# Patient Record
Sex: Female | Born: 1960 | Race: White | Hispanic: No | Marital: Single | Smoking: Never smoker
Health system: Southern US, Community
[De-identification: ages and names within clinical notes are randomized; demographics above are authoritative.]

## PROBLEM LIST (undated history)

## (undated) DIAGNOSIS — F329 Major depressive disorder, single episode, unspecified: Secondary | ICD-10-CM

## (undated) DIAGNOSIS — T884XXA Failed or difficult intubation, initial encounter: Secondary | ICD-10-CM

## (undated) DIAGNOSIS — F419 Anxiety disorder, unspecified: Secondary | ICD-10-CM

## (undated) DIAGNOSIS — T4145XA Adverse effect of unspecified anesthetic, initial encounter: Secondary | ICD-10-CM

## (undated) DIAGNOSIS — G4730 Sleep apnea, unspecified: Secondary | ICD-10-CM

## (undated) DIAGNOSIS — K289 Gastrojejunal ulcer, unspecified as acute or chronic, without hemorrhage or perforation: Secondary | ICD-10-CM

## (undated) DIAGNOSIS — K648 Other hemorrhoids: Secondary | ICD-10-CM

## (undated) DIAGNOSIS — T8859XA Other complications of anesthesia, initial encounter: Secondary | ICD-10-CM

## (undated) DIAGNOSIS — Z8542 Personal history of malignant neoplasm of other parts of uterus: Secondary | ICD-10-CM

## (undated) DIAGNOSIS — E785 Hyperlipidemia, unspecified: Secondary | ICD-10-CM

## (undated) DIAGNOSIS — F32A Depression, unspecified: Secondary | ICD-10-CM

## (undated) DIAGNOSIS — Z9884 Bariatric surgery status: Secondary | ICD-10-CM

## (undated) DIAGNOSIS — C801 Malignant (primary) neoplasm, unspecified: Secondary | ICD-10-CM

## (undated) DIAGNOSIS — I2510 Atherosclerotic heart disease of native coronary artery without angina pectoris: Secondary | ICD-10-CM

## (undated) DIAGNOSIS — K219 Gastro-esophageal reflux disease without esophagitis: Secondary | ICD-10-CM

## (undated) DIAGNOSIS — R011 Cardiac murmur, unspecified: Secondary | ICD-10-CM

## (undated) DIAGNOSIS — J189 Pneumonia, unspecified organism: Secondary | ICD-10-CM

## (undated) DIAGNOSIS — I10 Essential (primary) hypertension: Secondary | ICD-10-CM

## (undated) DIAGNOSIS — G4733 Obstructive sleep apnea (adult) (pediatric): Secondary | ICD-10-CM

## (undated) DIAGNOSIS — Z87442 Personal history of urinary calculi: Secondary | ICD-10-CM

## (undated) DIAGNOSIS — Z9989 Dependence on other enabling machines and devices: Secondary | ICD-10-CM

## (undated) DIAGNOSIS — Z86010 Personal history of colonic polyps: Secondary | ICD-10-CM

## (undated) DIAGNOSIS — M1990 Unspecified osteoarthritis, unspecified site: Secondary | ICD-10-CM

## (undated) HISTORY — DX: Malignant (primary) neoplasm, unspecified: C80.1

## (undated) HISTORY — DX: Sleep apnea, unspecified: G47.30

## (undated) HISTORY — DX: Essential (primary) hypertension: I10

## (undated) HISTORY — DX: Morbid (severe) obesity due to excess calories: E66.01

## (undated) HISTORY — DX: Cardiac murmur, unspecified: R01.1

## (undated) HISTORY — PX: COLONOSCOPY: SHX174

## (undated) HISTORY — PX: POLYPECTOMY: SHX149

## (undated) HISTORY — DX: Hyperlipidemia, unspecified: E78.5

---

## 1992-04-29 HISTORY — PX: UVULOPALATOPHARYNGOPLASTY, TONSILLECTOMY AND SEPTOPLASTY: SHX2632

## 1997-08-08 ENCOUNTER — Other Ambulatory Visit: Admission: RE | Admit: 1997-08-08 | Discharge: 1997-08-08 | Payer: Self-pay | Admitting: Obstetrics & Gynecology

## 1999-04-30 HISTORY — PX: REDUCTION MAMMAPLASTY: SUR839

## 1999-08-16 ENCOUNTER — Other Ambulatory Visit: Admission: RE | Admit: 1999-08-16 | Discharge: 1999-08-16 | Payer: Self-pay | Admitting: Obstetrics and Gynecology

## 1999-11-16 ENCOUNTER — Encounter: Payer: Self-pay | Admitting: Plastic Surgery

## 1999-11-16 ENCOUNTER — Observation Stay (HOSPITAL_COMMUNITY): Admission: RE | Admit: 1999-11-16 | Discharge: 1999-11-17 | Payer: Self-pay | Admitting: Plastic Surgery

## 1999-11-16 HISTORY — PX: OTHER SURGICAL HISTORY: SHX169

## 2000-06-17 ENCOUNTER — Observation Stay (HOSPITAL_COMMUNITY): Admission: EM | Admit: 2000-06-17 | Discharge: 2000-06-18 | Payer: Self-pay

## 2000-06-18 ENCOUNTER — Encounter: Payer: Self-pay | Admitting: Psychiatry

## 2001-12-02 ENCOUNTER — Encounter: Payer: Self-pay | Admitting: Family Medicine

## 2001-12-02 ENCOUNTER — Encounter: Admission: RE | Admit: 2001-12-02 | Discharge: 2001-12-02 | Payer: Self-pay | Admitting: Family Medicine

## 2004-04-29 DIAGNOSIS — C801 Malignant (primary) neoplasm, unspecified: Secondary | ICD-10-CM

## 2004-04-29 HISTORY — DX: Malignant (primary) neoplasm, unspecified: C80.1

## 2004-08-06 ENCOUNTER — Other Ambulatory Visit: Admission: RE | Admit: 2004-08-06 | Discharge: 2004-08-06 | Payer: Self-pay | Admitting: Gynecology

## 2004-08-13 ENCOUNTER — Other Ambulatory Visit: Admission: RE | Admit: 2004-08-13 | Discharge: 2004-08-13 | Payer: Self-pay | Admitting: Gynecology

## 2004-10-22 ENCOUNTER — Ambulatory Visit: Admission: RE | Admit: 2004-10-22 | Discharge: 2004-10-23 | Payer: Self-pay | Admitting: Gynecology

## 2004-10-23 ENCOUNTER — Ambulatory Visit: Payer: Self-pay | Admitting: Internal Medicine

## 2004-11-06 ENCOUNTER — Ambulatory Visit: Payer: Self-pay | Admitting: Internal Medicine

## 2004-12-20 ENCOUNTER — Encounter (INDEPENDENT_AMBULATORY_CARE_PROVIDER_SITE_OTHER): Payer: Self-pay | Admitting: *Deleted

## 2004-12-20 ENCOUNTER — Inpatient Hospital Stay (HOSPITAL_COMMUNITY): Admission: RE | Admit: 2004-12-20 | Discharge: 2004-12-22 | Payer: Self-pay | Admitting: Gynecology

## 2004-12-20 HISTORY — PX: ABDOMINAL HYSTERECTOMY: SHX81

## 2005-01-22 ENCOUNTER — Ambulatory Visit: Payer: Self-pay | Admitting: Internal Medicine

## 2005-02-28 ENCOUNTER — Ambulatory Visit: Payer: Self-pay | Admitting: Internal Medicine

## 2005-11-07 ENCOUNTER — Other Ambulatory Visit: Admission: RE | Admit: 2005-11-07 | Discharge: 2005-11-07 | Payer: Self-pay | Admitting: Gynecology

## 2006-05-01 ENCOUNTER — Other Ambulatory Visit: Admission: RE | Admit: 2006-05-01 | Discharge: 2006-05-01 | Payer: Self-pay | Admitting: Gynecology

## 2006-11-21 ENCOUNTER — Other Ambulatory Visit: Admission: RE | Admit: 2006-11-21 | Discharge: 2006-11-21 | Payer: Self-pay | Admitting: Gynecology

## 2006-11-26 ENCOUNTER — Emergency Department (HOSPITAL_COMMUNITY): Admission: EM | Admit: 2006-11-26 | Discharge: 2006-11-26 | Payer: Self-pay | Admitting: Family Medicine

## 2008-05-16 ENCOUNTER — Other Ambulatory Visit: Admission: RE | Admit: 2008-05-16 | Discharge: 2008-05-16 | Payer: Self-pay | Admitting: Gynecology

## 2008-05-16 ENCOUNTER — Ambulatory Visit: Payer: Self-pay | Admitting: Gynecology

## 2008-05-16 ENCOUNTER — Encounter: Payer: Self-pay | Admitting: Gynecology

## 2008-05-19 ENCOUNTER — Emergency Department (HOSPITAL_BASED_OUTPATIENT_CLINIC_OR_DEPARTMENT_OTHER): Admission: EM | Admit: 2008-05-19 | Discharge: 2008-05-19 | Payer: Self-pay | Admitting: Emergency Medicine

## 2008-05-19 ENCOUNTER — Ambulatory Visit: Payer: Self-pay | Admitting: Diagnostic Radiology

## 2008-07-12 ENCOUNTER — Encounter: Admission: RE | Admit: 2008-07-12 | Discharge: 2008-07-12 | Payer: Self-pay | Admitting: Family Medicine

## 2008-08-12 ENCOUNTER — Ambulatory Visit (HOSPITAL_COMMUNITY): Admission: RE | Admit: 2008-08-12 | Discharge: 2008-08-12 | Payer: Self-pay | Admitting: Gastroenterology

## 2008-12-23 ENCOUNTER — Encounter: Admission: RE | Admit: 2008-12-23 | Discharge: 2008-12-23 | Payer: Self-pay | Admitting: Family Medicine

## 2009-01-28 ENCOUNTER — Ambulatory Visit: Payer: Self-pay | Admitting: Diagnostic Radiology

## 2009-01-28 ENCOUNTER — Emergency Department (HOSPITAL_BASED_OUTPATIENT_CLINIC_OR_DEPARTMENT_OTHER): Admission: EM | Admit: 2009-01-28 | Discharge: 2009-01-28 | Payer: Self-pay | Admitting: Emergency Medicine

## 2009-04-29 DIAGNOSIS — Z86010 Personal history of colonic polyps: Secondary | ICD-10-CM

## 2009-04-29 DIAGNOSIS — Z860101 Personal history of adenomatous and serrated colon polyps: Secondary | ICD-10-CM

## 2009-04-29 HISTORY — DX: Personal history of colonic polyps: Z86.010

## 2009-04-29 HISTORY — DX: Personal history of adenomatous and serrated colon polyps: Z86.0101

## 2009-12-29 DIAGNOSIS — Z86010 Personal history of colonic polyps: Secondary | ICD-10-CM | POA: Insufficient documentation

## 2010-01-26 ENCOUNTER — Encounter: Admission: RE | Admit: 2010-01-26 | Discharge: 2010-01-26 | Payer: Self-pay | Admitting: Gynecology

## 2010-03-09 ENCOUNTER — Ambulatory Visit: Payer: Self-pay | Admitting: Gynecology

## 2010-03-09 ENCOUNTER — Other Ambulatory Visit: Admission: RE | Admit: 2010-03-09 | Discharge: 2010-03-09 | Payer: Self-pay | Admitting: Gynecology

## 2010-06-29 ENCOUNTER — Encounter (INDEPENDENT_AMBULATORY_CARE_PROVIDER_SITE_OTHER): Payer: BC Managed Care – PPO

## 2010-06-29 DIAGNOSIS — N393 Stress incontinence (female) (male): Secondary | ICD-10-CM

## 2010-07-20 ENCOUNTER — Ambulatory Visit: Payer: BC Managed Care – PPO | Admitting: Gynecology

## 2010-08-13 LAB — DIFFERENTIAL
Basophils Absolute: 0 10*3/uL (ref 0.0–0.1)
Basophils Relative: 1 % (ref 0–1)
Eosinophils Absolute: 0 10*3/uL (ref 0.0–0.7)
Eosinophils Relative: 0 % (ref 0–5)
Lymphocytes Relative: 41 % (ref 12–46)
Lymphs Abs: 2.6 10*3/uL (ref 0.7–4.0)
Monocytes Absolute: 0.7 10*3/uL (ref 0.1–1.0)
Monocytes Relative: 11 % (ref 3–12)
Neutro Abs: 3 10*3/uL (ref 1.7–7.7)
Neutrophils Relative %: 47 % (ref 43–77)

## 2010-08-13 LAB — COMPREHENSIVE METABOLIC PANEL
ALT: 49 U/L — ABNORMAL HIGH (ref 0–35)
AST: 40 U/L — ABNORMAL HIGH (ref 0–37)
Albumin: 4.3 g/dL (ref 3.5–5.2)
Alkaline Phosphatase: 65 U/L (ref 39–117)
BUN: 14 mg/dL (ref 6–23)
CO2: 26 mEq/L (ref 19–32)
Calcium: 9.2 mg/dL (ref 8.4–10.5)
Chloride: 105 mEq/L (ref 96–112)
Creatinine, Ser: 0.7 mg/dL (ref 0.4–1.2)
GFR calc Af Amer: >60 mL/min (ref >60)
GFR calc non Af Amer: >60 mL/min (ref >60)
Glucose, Bld: 178 mg/dL — ABNORMAL HIGH (ref 70–99)
Potassium: 3.6 mEq/L (ref 3.5–5.1)
Sodium: 141 mEq/L (ref 135–145)
Total Bilirubin: 0.5 mg/dL (ref 0.3–1.2)
Total Protein: 7.4 g/dL (ref 6.0–8.3)

## 2010-08-13 LAB — CBC
HCT: 42.2 % (ref 36.0–46.0)
Hemoglobin: 14.1 g/dL (ref 12.0–15.0)
MCHC: 33.5 g/dL (ref 30.0–36.0)
MCV: 93.6 fL (ref 78.0–100.0)
Platelets: 229 10*3/uL (ref 150–400)
RBC: 4.51 MIL/uL (ref 3.87–5.11)
RDW: 12.4 % (ref 11.5–15.5)
WBC: 6.3 10*3/uL (ref 4.0–10.5)

## 2010-08-13 LAB — URINALYSIS, ROUTINE W REFLEX MICROSCOPIC
Bilirubin Urine: NEGATIVE
Glucose, UA: NEGATIVE mg/dL
Ketones, ur: 15 mg/dL — AB
Leukocytes, UA: NEGATIVE
Nitrite: NEGATIVE
Protein, ur: NEGATIVE mg/dL
Specific Gravity, Urine: 1.023 (ref 1.005–1.030)
Urobilinogen, UA: 1 mg/dL (ref 0.0–1.0)
pH: 5.5 (ref 5.0–8.0)

## 2010-08-13 LAB — LIPASE, BLOOD: Lipase: 69 U/L (ref 23–300)

## 2010-08-13 LAB — URINE MICROSCOPIC-ADD ON

## 2010-08-13 LAB — GLUCOSE, CAPILLARY: Glucose-Capillary: 184 mg/dL — ABNORMAL HIGH (ref 70–99)

## 2010-09-14 NOTE — H&P (Signed)
Sandra [name], Sandra [name]                 ACCOUNT NO.:  [id number]   MEDICAL RECORD NO.:  [id number]          PATIENT TYPE:  INP   LOCATION:  NA                            F[facility]   PHYSICIAN:  Juan H. Lily Peer, M.D.DATE OF BIRTH:  February 12, 1961   DATE OF ADMISSION:  10/23/2004  DATE OF DISCHARGE:                                HISTORY & PHYSICAL   CHIEF COMPLAINT:  1.  Menometrorrhagia.  2.  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  3.  Intrauterine polyps.   HISTORY OF PRESENT ILLNESS:  The patient is a 50 year old gravida 2, para 2  who was seen in the office for preoperative consultation on August 21, 2004.  She had been referred to our practice from Dr. Marjory Lies at [facility] secondary to complaints of irregular period for several  months where the patient was having at one time two periods per month. She  stated that her menses lasted seven to ten days, usually they were very  heavy with the passage of large clots and cramping. She had reported  spotting for a few days and in between her periods as well. She had  undergone a Pap smear on August 13, 2004 of this year which was normal. She  underwent an endometrial biopsy on August 13, 2004 which demonstrated complex  hyperplasia without atypia and on the August 15, 2004, she underwent the  sonohistogram which demonstrated multiple defects of the intrauterine cavity  consistent with thickness of the endometrium and endometrial stripe was 16.8  mm. This irregularity in the endometrial cavity. One measured 30 x 12 mm and  the 11 x 6 mm and the other 17 x 11 mm and the other one 23 x 11 mm and 19  mm respectively.   Due to the patient's risk for endometrial cancer based on her being  overweight at 192 pounds and being hypertensive and with the finding of  complex hyperplasia without atypia, it was recommended that she undergo a  total abdominal hysterectomy. The patient with a somewhat narrow pelvis. She  has had  previous cesarean section and an abdominal approach was recommended.  The patient has history of hypertension and she was worked up in the office  and been found that she is a prototype for metabolic syndrome. She had an  elevated blood sugar when she was screened on August 15, 2004 and her lipid  profile demonstrated triglycerides elevated at 306. She also had a TSH as  well which was normal and she was followed by her family practice physician  who had given medical clearance for this patient.   PAST MEDICAL HISTORY:  1.  She has had one normal spontaneous vaginal delivery and one cesarean      section with bilateral tubal sterilization.  2.  She has had a tonsillectomy and adenoidectomy.  3.  For her hypertension, she has been on Altace 10 mg daily and      hydrochlorothiazide 25 mg daily.   PHYSICAL EXAMINATION:  VITAL SIGNS:  The patient weighs 192 pounds and she  is 5 feet 4  inches tall.  HEENT:  Unremarkable.  NECK:  Supple. Trachea midline. No carotid bruits or thyromegaly.  LUNGS:  Clear to auscultation without rhonchi or wheezes.  HEART:  Regular rate and rhythm. No murmurs or gallops.  BREASTS:  Unremarkable.  ABDOMEN:  Soft and nontender without rebound or guarding.  PELVIC:  Bartholin's, urethra, Skene's were within normal limits. Vaginal  and cervix showed no lesion or discharge. Uterus anteverted, slightly  irregular, 8 to 10 week size. Adnexa difficult to evaluate due to the  patient's abdominal girth.   ASSESSMENT:  A 50 year old gravida 2, para 2 with previous cesarean section  and tubal sterilization procedure with several month history of  dysfunctional uterine bleeding, dysmenorrhea. She had undergone an  endometrial biopsy which demonstrated complex hyperplasia without atypia.  The patient with history of hypertension, currently on Altace 10 mg daily  and hydrochlorothiazide 25 mg daily. She has been under the care of Dr.  Marjory Lies at [facility]. She was cleared for her  surgery. The patient wishes to keep her ovaries and allow for natural  menopause and removal of one or both ovaries if it is clinically indicated  at the time of her surgery. We had discussed the [employer] of OB/GYN  guidelines for hysterectomy, its risks, benefits, pros and cons, as well as  hormone replacement therapy in the event that both ovaries would need to be  removed, and also the findings in the [facility] Issue and trial. We  also discussed potential risk of the operation such as infection, although  she will receive prophylactic antibiotic. She will have PSA stockings to  prevent DVT and pulmonary embolus. We also discussed in the event of  uncontrollable hemorrhage and if she were to need any blood or blood  products or potential risk of anaphylactic reaction. Hepatitis and AIDS were  discussed. We discussed potential trauma to internal organs such as that  would require corrective surgery to be done at that time. She is fully  aware, accepts, and is willing to proceed with this surgery as recommended.  As part of her preoperative labs, she will have a CBC, type and screen,  serum HCG, EKG, chest x-ray, urinalysis, and a comprehensive metabolic  panel.   PLAN:  The patient is scheduled for a total abdominal hysterectomy, possible  removal of one or both ovaries tomorrow, Tuesday, October 23, 2004 at 7:30 at  [facility].       JHF/MEDQ  D:  10/22/2004  T:  10/22/2004  Job:  [id number]   cc:   Marjory Lies, M.D.  [address]  [place]  [place] [address]  Fax: [phone]

## 2010-09-14 NOTE — Discharge Summary (Signed)
Sandra [name], Sandra [name]                 ACCOUNT NO.:  [id number]   MEDICAL RECORD NO.:  [id number]          PATIENT TYPE:  INP   LOCATION:  9303                          [facility]ILITY:  [facility]   PHYSICIAN:  Timothy P. Fontaine, M.D.DATE OF BIRTH:  07/06/1960   DATE OF ADMISSION:  12/20/2004  DATE OF DISCHARGE:  12/22/2004                                 DISCHARGE SUMMARY   DISCHARGE DIAGNOSES:  1.  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  2.  Hypertension.  3.  Diabetes.   PROCEDURE:  Total abdominal hysterectomy August 24.   PATHOLOGY:  [id number]:  Well-differentiated endometrioid carcinoma, FIGO  grade 1, arising in background of complex hyperplasia.  No invasion  identified.  Cervix negative for carcinoma.  Myometrium:  Leiomyomata.   HOSPITAL COURSE:  The patient underwent uncomplicated total abdominal  hysterectomy December 20, 2004.  Her postoperative course was uncomplicated.  She was discharged on postoperative day #2 ambulating well, tolerating a  regular diet, with a postoperative hemoglobin of 10.7.  The patient received  precautions, instructions and follow-up.  She will be seen in the office for  staple removal and received prescriptions for Lortab 7.5/500 mg one p.o. q.4-  6h. p.r.n. pain, Reglan 10 mg one p.o. q.4-6h. p.r.n. pain, per Dr. Reynaldo Minium.      Timothy P. Fontaine, M.D.  Electronically Signed     TPF/MEDQ  D:  01/26/2005  T:  01/26/2005  Job:  [id number]

## 2010-09-14 NOTE — H&P (Signed)
Sandra [name], Sandra [name]                 ACCOUNT NO.:  [id number]   MEDICAL RECORD NO.:  [id number]          PATIENT TYPE:  INP   LOCATION:  NA                            [facility]   PHYSICIAN:  Juan H. Lily Peer, M.D.DATE OF BIRTH:  28-Sep-1960   DATE OF ADMISSION:  12/20/2004  DATE OF DISCHARGE:                                HISTORY & PHYSICAL   CHIEF COMPLAINT:  1.  Menometrorrhagia.  2.  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  3.  Endometrial polyps.   HISTORY:  The patient is a 50 year old, gravida 2, para 2, who was scheduled  to undergo a total abdominal hysterectomy on October 23, 2004 as a result of  her menometrorrhagia, endometrial polyps, and complex hyperplasia, but it  had been cancelled secondary to patient's hypertension.  She eventually was  referred to an internal medicine specialist, Dr. Oliver Barre, who had  adjusted her medications, in which she is currently on Altace 30 mg daily,  along with hydrochlorothiazide 25 mg daily.  The patient had been suffering  for quite some time with irregular periods, whereby she would bleed for  approximately 10 days and spot in between.  She had undergone endometrial  biopsy on August 13, 2004, which demonstrated complex hyperplasia with  atypia.  She had also, on August 15, 2004, undergone a sonohystogram which  demonstrated multiple defects in the intrauterine cavity, consistent with  thickness of the endometrium and then an atrial stripe being 16.8 mm.  The  irregular in the endometrial cavity had demonstrated 1 lesion measuring 30 x  12 mm, and the other one measuring 11 x 6 mm, and the other one 17 x 11 mm,  and another one measuring 23 x 11 mm, suspicious for endometrial polyps.  The patient was seen for preoperative consultation on December 12, 2004, and a  repeat endometrial biopsy was done, and once again demonstrated persistence  of complex glandular proliferation with features consistent with exogenous  progesterone effect, and  she had been placed on Megace 20 mg b.i.d. in an  effort to help stop her bleeding until the time of her surgery.  Finally her  CBC indices have not indicated any evidence of anemia, although she had been  taking supplemental iron.  The patient had a mammogram in August of this  year which was normal, and was cleared for surgery by Dr. Oliver Barre.  Due  to patient's obesity of weighing 192 pounds and her pelvis, it was decided  to proceed with a vaginal approach for her hysterectomy.   PAST MEDICAL HISTORY:  1.  History of hypertension, for which she is currently on Benicar 40 daily.  2.  For her type 2 diabetes, which is on Actos 15 mg daily.  3.  Also, as part of her hypertensive regimen, she is on hydrochlorothiazide      25 mg daily.   PAST PROCEDURES:  1.  One normal spontaneous vaginal delivery and one cesarean section.  2.  Tonsillectomy and adenoidectomy.  3.  Bilateral breast reduction.   ALLERGIES:  The patient denies any allergies.  SOCIAL HISTORY:  The patient denies alcohol or cigarette smoking.  She is  married with 2 children.  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   FAMILY HISTORY:  The patient is adopted.  Otherwise, noncontributory.   REVIEW OF SYSTEMS:  Likewise doing well.  Her Pap smears have been up to  date, which was recently done on April of 2006 which was negative.   PHYSICAL EXAMINATION:  GENERAL:  The patient is 50 years of age.  VITAL SIGNS:  Last blood pressure reading 139/89, respirations 20, pulse 87,  temperature 98.6, weight 192 pounds.  The patient is morbidly obese for her  height.  HEENT:  Unremarkable.  NECK:  Supple.  Trachea midline.  No carotid bruits, no thyromegaly.  LUNGS:  Clear to auscultation without rhonchi or wheezes.  HEART:  Regular rate and rhythm.  No murmurs or gallops.  BREAST EXAM:  Was done at the time of her annual exam, and was reportedly  normal.  ABDOMEN:  Soft, nontender, without  rebound or guarding.  PELVIC:  Bartholin's urethra, Skene's glands within normal limits.  Vagina  and cervix reveal no lesions or discharge.  Uterus irregular __________  regular of approximately 8 weeks size.  Adnexa difficult to evaluate due to  patient's obesity, and he evaluated by ultrasound, which demonstrates she  had no adnexal masses based on ultrasound on August 15, 2004.  Rectal exam:  Unremarkable.   ASSESSMENT:  A 50 year old gravida 2, para 2, with type 2 diabetes and  hypertension.  Previously scheduled for a total abdominal hysterectomy as a  result of dysfunctional uterine bleeding (complex hyperplasia without  atypical) with uterine polyps.  The patient had previously been scheduled,  but had been counseled secondary to poorly-controlled hypertension and  diabetes.  The patient has been cleared by Dr. Oliver Barre for her surgery,  and, due to her obesity and narrow pelvis, it was decided to proceed with  the abdominal approach.  The patient wishes to keep her ovaries.  The risks  and benefits and PRO's of the operation were discussed in detail to include  the potential for infection, although she will receive prophylactic  antibiotic.  The risks for deep vein thrombosis and subsequent pulmonary  embolus, and death were discussed.  The patient will have PSA stockings for  prophylaxis.  Also, in the event of uncontrolled hemorrhage, then the  patient would receive blood and blood products.  He is fully aware that the  potential need of this life-saving measure to receive blood and blood  products with his potential risk of anaphylactic reactions to hepatitis and  AIDS.  Also, the risk of trauma to internal organs with corrective surgery  to be done at the same time were discussed.  Anesthesia to discuss potential  risks from being put to sleep, and anesthetic agents will be discussed by them separately.  The patient understandings that if both ovaries are  removed if  surgically indicated that she would need to be placed on hormone  replacement therapy, and Women's Health initial trial had been discussed  before.  The patient wishes to retain her ovaries.   PLAN:  Proceed with a transabdominal hysterectomy with ovarian conservation.  All questions were answered, and will follow accordingly.   PLAN:  As patient assessment above.      Juan H. Lily Peer, M.D.  Electronically Signed     JHF/MEDQ  D:  12/19/2004  T:  12/19/2004  Job:  [id number]

## 2010-09-14 NOTE — Op Note (Signed)
[place]  Patient:    Sandra [name], Sandra [name]                        MRN: [id number] Proc. Date: 11/16/99 Adm. Date:  [id number] Disc. Date: [id number] Attending:  Loura Halt Ii Dictator:   Alfredia Ferguson, M.D.                           Operative Report  PREOPERATIVE DIAGNOSIS:  Bilateral symptomatic macromastia.  POSTOPERATIVE DIAGNOSIS: Bilateral symptomatic macromastia.  OPERATION PERFORMED:  Bilateral reduction mammoplasty.  SURGEON:  Alfredia Ferguson, M.D.  FIRST ASSISTANT:  Desmond Dike, M.D.  ANESTHESIA:  General endotracheal anesthesia.  INDICATIONS FOR SURGERY:  A 50 year old woman who has severe sleep apnea.  she also has symptomatic macromastia.  She wishes to have elective bilateral breast reduction.  She understands the risk of asymmetry, unsightly scarring, vascular compromise to the nipple, infection, bleeding requiring requiring transfusion, difficulty breathing after surgery secondary to her sleep apnea, possible need for prolonged hospitalization secondary to sleep apnea, numbness of the nipple and overall dissatisfaction of the results.  Despite of these and other risks discussed with the patient in the office she wished to proceed with the operation.  DESCRIPTION OF SURGERY:  On the day prior to surgery, skin marks were placed outlining a Wise skin pattern.  The patient was taken to the operating room where her chest was prepped and draped in a sterile fashion after adequate general endotracheal anesthesia.  Attention was first directed to the left side.  A 42 mm diameter circle was drawn around the nipple area.  An 8 cm wide inferiorly based pedicle was marked.  The pedicle was now deepithelialized.  The pedicle was dissected away from the surrounding breast tissue using electrocautery dissecting straight down to the chest wall medially and obliquely going in a lateral direction on the lateral side in order to insure  adequate vascular integrity to the nipple areolar complex.  The medial lateral and superior breast was elevated off the pectoralis fascia.  The dermaglandular wedge medially was now incised at the predetermined mark and skin incision was deepened through the skin and subcutaneous tissue.  Using electrocautery the medial dermaglandular wedge was dissected away from the medial breast flap in an oblique fashion.  Superiorly the incision was made around the new nipple areolar complex location and again the breast tissue was dissected obliquely off superior breast flap in a superior direction.  Laterally the lateral breast flap was incised at the predetermined location and the dermaglandular wedge was dissected away from the lateral breast flap leaving the lateral breast flap approximately 3 cm in thickness.  The entire excess breast tissue was now removed as one unit thinning the flaps as desired.  Excess tissue was passed off the field.  A total of 593 grams was removed from the left side.  Electrocautery was used for hemostasis throughout the entire procedure.  Following hemostasis the breast wound was copiously irrigated with saline irrigations.  It was again inspected for hemostasis.  The breast was temporarily closed using skin staples.  Attention was directed to the right breast where an identical reduction was performed.  594 grams removed from the right side.  Following achievement of hemostasis and copious irrigation on the right side closure was carried out simultaneously on the left and right side.  The inferior corner of the vertical limb of the  medial and lateral breast flap was united to the mid portion of the inframammary crease using interrupted 2-0 Vicryl suture.  The superior corner of the vertical incision through the medial and lateral breast were united in a similar fashion.  The nipple areolar complex was placed in its new position and fixed in position using interrupted  3-0 Vicryl for the dermis followed by a running 3-0 Vicryl subcuticular.  The remainder of the transverse inframammary crease incision and vertical limb of the incision was closed using interrupted 2-0 Vicryl sutures in the dermis.  The breast area was then cleansed, dressings were applied.  Steri-Strips were applied on the skin incision prior to placement of dressings.  A circumferential breast wrap was placed.  The patient tolerated the procedure well.  She had an estimated blood loss of less than 200 cc.  She was awakened, extubated and transferred to the recovery room in satisfactory condition. DD:  11/16/99 TD:  11/18/99 Job: [id number]

## 2010-09-14 NOTE — Discharge Summary (Signed)
[place]  Patient:    VAISHNAVI, DALBY                        MRN: [id number] Adm. Date:  [id number] Disc. Date: 06/18/00 Attending:  Lucas Mallow Dictator:   Joellyn Rued, P.A.C. CC:         Delorse Lek, M.D.   Discharge Summary  HISTORY OF PRESENT ILLNESS:  Ms. Anner Crete is a 50 year old white female who is referred to [facility] emergency room by her primary care M.D. secondary to chest discomfort.  She describes at least a month history of headaches, diffuse tingling, and was prescribed Zoloft last week without any improvement.  On the morning of admission, she woke up feeling bad.  She describes this as nauseated.  She developed anterior chest tightness around 8:30 on the way to work.  This radiated into her left shoulder and she felt that her arm became numb and she could not grip the steering wheel.  She told a coworker about this at work and she suggested to call her M.D.  She did so. At this point, she was short of breath and very anxious.  She went to her primary M.D.s office and he referred her here for further evaluation.  At the office, she did receive four baby aspirin, sublingual nitroglycerin, and oxygen with possibility of some improvement.  An EKG was performed that showed normal sinus rhythm, nonspecific changes, however, the leads in V2 and V3 were reversed.  EMS was called and transported here to the hospital.  She denies any prior occurrences, palpitations, or syncope.  She does have a history of exertional shortness of breath, has not had any change in pattern.  She also has problems with pedal edema which resolves at night and also has problems with indigestion especially with some foods, but she feels that this is different from the previous symptoms.  She has a history of hypertension.  She was just diagnosed with non-insulin-dependent diabetes mellitus and actually started a diet yesterday.   She is obese.  She has a history of hyperlipidemia, last cholesterol available to [place] showed a cholesterol of 202, triglycerides 376, LDL 75, HDL 33, done in January of 2001.  She also has sleep apnea and has a CPAP machine, but she does not use this.  LABORATORY DATA:  Admission H&H was 13.4 and 39.6, normal indices, platelets 285, WBC 6.3, sodium 140, potassium 3.6, BUN 13, creatinine 0.6, glucose 130. CKs and troponins were negative for myocardial infarction.  Amylase and lipase were 35 and 23, respectively.  EKG showed normal sinus rhythm.  HOSPITAL COURSE:  Overnight, she did not have any further chest discomfort. She continues to have the headache, particularly in the back of her neck. Altace was started for her hypertension.  (It was noted that at the time of admission she did not tell us that she had just received prescription for Univasc and hydrochlorothiazide).  Stress Cardiolite was performed on February 20.  She achieved 85% predicted maximum heart rate after 5-1/2 minutes utilizing the Bruce protocol.  She complained of fatigue.  She denied any chest discomfort.  Imaging showed an EF of 75%, no signs of ischemia or scarring.  Diabetes coordinator saw her and assisted with teaching.  Once images were obtained, they were discussed with Dr. Tenny Craw and it was felt that she could be discharged home.  DISCHARGE DIAGNOSES: 1. Chest discomfort of unknown etiology.  2. Multiple cardiac risk factors; obesity, hypertension, diabetes,    hyperlipidemia.  DISPOSITION:  She is discharged home.  She is given prescriptions for Altace 2.5 mg q.d. in the place of Univasc.  She was also asked to take a coated aspirin 325 mg q.d. to continue her Toprol XL 100 mg q.d., her hydrochlorothiazide as previously and her Zoloft as previously.  She was asked to continue with her low salt, fat, cholesterol, ADA diet.  She was recommended to obtain some blood work next week for a BMP to evaluate  her kidney function and potassium with the medication adjustments.  She will see her physician in two to three weeks to follow up on her diabetes education and she also needs to follow up on her lipid panel as that she may need a lipid agent and encouragement for risk factor behavioral modification. DD:  06/18/00 TD:  06/18/00 Job: [id number]

## 2010-09-14 NOTE — Op Note (Signed)
Sandra [name], [name]                 ACCOUNT NO.:  [id number]   MEDICAL RECORD NO.:  [id number]          PATIENT TYPE:  INP   LOCATION:  9399                          FACILITY:  [facility]   PHYSICIAN:  Juan H. Lily Peer, M.D.DATE OF BIRTH:  1960/12/04   DATE OF PROCEDURE:  12/20/2004  DATE OF DISCHARGE:                                 OPERATIVE REPORT   SURGEON:  Juan H. Lily Peer, M.D.   FIRST ASSISTANT:  Daniel L. Eda Paschal, M.D.   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   PREOPERATIVE DIAGNOSES:  1.  Menometrorrhagia.  2.  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  3.  Endometrial polyps.   POSTOPERATIVE DIAGNOSES:  1.  Menometrorrhagia.  2.  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  3.  Endometrial polyps.   ANESTHESIA:  General endotracheal anesthesia (see separate report).   PROCEDURE PERFORMED:  Total abdominal hysterectomy.   FINDINGS:  Patient with a small anterior leiomyoma, anterior uterine wall.  Normal-appearing tubes and ovaries.  After opening the specimen, patient  with polypoid and fleshy-like endometrium.   DESCRIPTION OF OPERATION:  After the patient was adequately counseled, she  was taken to the operating room, where she underwent general endotracheal  anesthesia.  After anesthesia was attained, the patient was prepped and  draped in the usual sterile fashion.  A Foley catheter had been inserted  into the bladder to evacuate its contents.  A Pfannenstiel skin incision was  made 2 cm above the symphysis pubis.  The incision was carried down through  the subcutaneous tissue down to the rectus fascia, whereby the rectus fascia  was incised in a transverse fashion.  The patient was then placed in  Trendelenburg position.  The O'Connor-O'Sullivan retractors were in place.  Two Kocher clamps were placed hugging the uterus in the area of the triple  pedicles for  traction.  The right round ligament was identified, was suture  ligated with 0 Vicryl suture, transected, and the bladder flap was developed  with the surgeon's finger.  The posterior broad ligament was penetrated and  the utero-ovarian ligament was clamped, cut, and suture ligated with 0  Vicryl suture and leaving the right tube and ovary behind.  Of note, a  second suture in a free tie fashion was also placed to secure the  infundibulopelvic ligament.  A similar procedure was carried out on the  contralateral side.  The remaining broad and cardinal ligaments were  serially clamped, cut, and suture ligated with 0 Vicryl suture to the level  of the uterine arteries.  Once this was clamped, cut, and suture ligated,  the uterus was amputated and passed off the operative field for better  visualization.  The remaining cervical stump was placed under traction and  two Kochers were utilized, hugging the lower uterine segment and cervix, and  was cut, suture ligated with 0 Vicryl suture, and the remaining cervix was  amputated and passed off the operative field.  Both angles were secured with  0 Vicryl suture and the  remaining cuff was secured with interrupted sutures  of 0 Vicryl suture.  The pelvic cavity was copiously irrigated with normal  saline solution.  Both pedicles were inspected.  Hemostasis was present.  The sponge count and needle count were correct.  The O'Connor-O'Sullivan  retractors were removed.  The visceral peritoneum was not reapproximated,  but the rectus fascia was closed with 0 Vicryl suture and the subcutaneous  bleeders were Bovie cauterized.  The skin was reapproximated with skin  clips, followed by placement of Xeroform gauze and 4 x 8 dressing.  The  patient was extubated and transferred to the recovery room with stable vital  signs. Blood loss for the procedure was recorded at 175 mL, urine output 200  mL and clear, IV fluids 1500 mL of lactated Ringer's.  Of note,  the patient  received a gram of Cefotan prophylactically preoperatively as well as she  had PSA stockings for DVT prophylaxis.      Juan H. Lily Peer, M.D.  Electronically Signed     JHF/MEDQ  D:  12/20/2004  T:  12/20/2004  Job:  [id number]

## 2011-01-14 ENCOUNTER — Encounter: Payer: Commercial Managed Care - PPO | Attending: Family Medicine | Admitting: *Deleted

## 2011-01-14 ENCOUNTER — Encounter: Payer: Self-pay | Admitting: *Deleted

## 2011-01-14 DIAGNOSIS — Z713 Dietary counseling and surveillance: Secondary | ICD-10-CM | POA: Insufficient documentation

## 2011-01-14 DIAGNOSIS — E119 Type 2 diabetes mellitus without complications: Secondary | ICD-10-CM | POA: Insufficient documentation

## 2011-01-14 NOTE — Patient Instructions (Signed)
Patient will attend Core Diabetes Courses as scheduled or follow up prn.  

## 2011-01-14 NOTE — Progress Notes (Signed)
  Patient was seen on 01/14/2011 for the first of a series of three diabetes self-management courses at the [facility]. The following learning objectives were met by the patient during this course:   Defines diabetes and the role of insulin  Identifies type of diabetes and pathophysiology  States normal BG range and personal goals  Identifies three risk factors for the development of diabetes  States the need for and frequency of healthcare follow up (ADA Standards of Care)  Handouts given during class include:  [facility] Core Class 1 Handout  ADA: What's My Number (A1c handout)  [facility] ADA Standards of Care Handout  [facility] Medication Log handout  Most recent A1c = 7.5  Patient has established the following initial goals:  Increased exercise  Work on stress levels  Monitor blood glucose levels  Lose weight  Follow a diabetes meal plan  Follow-Up Plan: Pt to attend Core Diabetes Classes at [facility] in October of 2012

## 2011-01-15 ENCOUNTER — Encounter: Payer: Self-pay | Admitting: *Deleted

## 2011-02-05 ENCOUNTER — Encounter: Payer: 59 | Attending: Family Medicine | Admitting: Dietician

## 2011-02-05 DIAGNOSIS — E119 Type 2 diabetes mellitus without complications: Secondary | ICD-10-CM | POA: Insufficient documentation

## 2011-02-05 DIAGNOSIS — Z713 Dietary counseling and surveillance: Secondary | ICD-10-CM | POA: Insufficient documentation

## 2011-02-06 NOTE — Progress Notes (Signed)
  Patient was seen on 02/05/2011 for the second of a series of three diabetes self-management courses at the [facility]. The following learning objectives were met by the patient during this course:   States the relationship of exercise to blood glucose  States benefits/barriers of regular and safe exercise  States three guidelines for safe and effective exercise  Describes personal diabetes medicine regimen  Describes actions of own medications  Describes causes, symptoms, and treatment of hypo/hyperglycemia  Describes sick day rules  Identifies when to test urine for ketones when appropriate  Identifies when to call healthcare provider for acute complications  States the risk for problems with foot, skin, and dental care  States preventative foot, skin, and dental care measures  States when to call healthcare provider regarding foot, skin, and dental care  Identifies methods for evaluation of diabetes control  Discusses benefits of SBGM  Identifies relationship between nutrition, exercise, medication, and glucose levels  Discusses the importance of record keeping  *Patient received [facility] Core Program Notebook at class.  Follow-Up Plan: Patient will attend the final class of the ADA Diabetes Self-Care Education.   

## 2011-02-12 ENCOUNTER — Ambulatory Visit: Payer: BC Managed Care – PPO

## 2011-03-14 ENCOUNTER — Ambulatory Visit: Payer: BC Managed Care – PPO

## 2011-03-18 ENCOUNTER — Other Ambulatory Visit: Payer: Self-pay | Admitting: Gynecology

## 2011-03-18 DIAGNOSIS — Z1231 Encounter for screening mammogram for malignant neoplasm of breast: Secondary | ICD-10-CM

## 2011-03-19 ENCOUNTER — Ambulatory Visit: Payer: BC Managed Care – PPO

## 2011-04-10 ENCOUNTER — Ambulatory Visit: Payer: BC Managed Care – PPO

## 2011-04-16 ENCOUNTER — Encounter: Payer: Commercial Managed Care - PPO | Attending: Family Medicine | Admitting: Dietician

## 2011-04-16 DIAGNOSIS — E119 Type 2 diabetes mellitus without complications: Secondary | ICD-10-CM | POA: Insufficient documentation

## 2011-04-16 DIAGNOSIS — Z713 Dietary counseling and surveillance: Secondary | ICD-10-CM | POA: Insufficient documentation

## 2011-04-17 NOTE — Progress Notes (Signed)
  Patient was seen on 04/17/2011 for the third of a series of three diabetes self-management courses at the [facility]. The following learning objectives were met by the patient during this course:   Identifies nutrient effects on glycemia  States the general guidelines of meal planning  Relates understanding of personal meal plan  Describes situations that cause stress and discuss methods of stress management  Identifies lifestyle behaviors for change  The following handouts were given in class:  [employer] Carbohydrate Counting book  3 Month Follow Up Visit handout  Goal setting handout  Class evaluation form  Your patient has established the following 3 month goals for diabetes self-care:  Decrease my fat intake  Walk 30 minutes 4 times per week.  Follow-Up Plan: Patient will attend a 3 month follow-up visit for diabetes self-management education.

## 2011-04-25 ENCOUNTER — Other Ambulatory Visit: Payer: Self-pay | Admitting: Urology

## 2011-05-14 ENCOUNTER — Other Ambulatory Visit: Payer: Self-pay | Admitting: Urology

## 2011-05-15 MED ORDER — ACETAMINOPHEN 10 MG/ML IV SOLN: 1000.0000 mg | Freq: Four times a day (QID) | INTRAVENOUS | Status: AC

## 2011-05-30 ENCOUNTER — Ambulatory Visit
Admission: RE | Admit: 2011-05-30 | Discharge: 2011-05-30 | Disposition: A | Discharge status: Home / Self Care | Payer: Commercial Managed Care - PPO | Source: Ambulatory Visit | Attending: Gynecology | Admitting: Gynecology

## 2011-05-30 DIAGNOSIS — Z1231 Encounter for screening mammogram for malignant neoplasm of breast: Secondary | ICD-10-CM

## 2011-07-25 ENCOUNTER — Encounter: Payer: 59 | Attending: Family Medicine | Admitting: Dietician

## 2011-07-25 DIAGNOSIS — E119 Type 2 diabetes mellitus without complications: Secondary | ICD-10-CM | POA: Insufficient documentation

## 2011-07-25 DIAGNOSIS — Z713 Dietary counseling and surveillance: Secondary | ICD-10-CM | POA: Insufficient documentation

## 2011-07-29 NOTE — Progress Notes (Signed)
  Patient was seen on 07/25/2011 for their 3 month follow-up as a part of the diabetes self-management courses at the [facility]. The following learning objectives were met by your patient during this course:  Patient self reports the following:  Diabetes control has improved since diabetes self-management training: yes Number of days blood glucose is >200: none Last MD appointment for diabetes: December 2012 Changes in treatment plan: none Confidence with ability to manage diabetes: Feels better able to manage her diabetes Areas for improvement with diabetes self-care:1. Lose more weight 2. Continue to watch food portions  3.  Exercise more regularly.   Willingness to participate in diabetes support group: Willing if I can.  Works most evenings until 6:00 PM   Please see Diabetes Flow sheet for findings related to patient's self-care. A1C: 12/2010= 7.5%    03/2011= 6.5% Weight: 12/2010= 186.3 lb  07/25/2011= 165.2 lb  Total Lost= 19.1 lb Follow-Up Plan: Patient is eligible for a "free" 30 minute diabetes self-care appointment in the next year. Patient to call and schedule as needed.

## 2011-07-31 ENCOUNTER — Encounter (HOSPITAL_COMMUNITY): Payer: Self-pay | Admitting: *Deleted

## 2011-07-31 ENCOUNTER — Emergency Department (HOSPITAL_COMMUNITY)
Admission: EM | Admit: 2011-07-31 | Discharge: 2011-07-31 | Disposition: A | Discharge status: Home / Self Care | Payer: 59 | Source: Home / Self Care | Attending: Emergency Medicine | Admitting: Emergency Medicine

## 2011-07-31 DIAGNOSIS — L0291 Cutaneous abscess, unspecified: Secondary | ICD-10-CM

## 2011-07-31 DIAGNOSIS — L0390 Cellulitis, unspecified: Secondary | ICD-10-CM

## 2011-07-31 MED ORDER — SULFAMETHOXAZOLE-TMP DS 800-160 MG PO TABS: 2.0000 | ORAL_TABLET | Freq: Two times a day (BID) | ORAL | Status: AC

## 2011-07-31 MED ORDER — MUPIROCIN 2 % EX OINT: TOPICAL_OINTMENT | Freq: Three times a day (TID) | CUTANEOUS | Status: AC

## 2011-07-31 NOTE — ED Notes (Signed)
Pt states she noticed a bump on right lower abd yesterday.  Tender to palpation, no drainage noted.  Pus filled bump about dime size noted to right lower abd above groin.  Today pt states she noticed something similar to back of right ear.  Small red bump noted.  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

## 2011-07-31 NOTE — ED Provider Notes (Signed)
Chief Complaint  Patient presents with  . Abscess    History of Present Illness:   The patient has had a two-day history of boils. The first appeared in the right lower quadrant of the abdomen and the second on the right ear. These have not drained. No fever, chills, or sweats. No prior history of abscesses. No history of MRSA, but she does have diabetes and she has not been exposed to anyone with MRSA.  Review of Systems:  Other than noted above, the patient denies any of the following symptoms: Systemic:  No fever, chills or sweats. Skin:  No rash or itching.  PMFSH:  Past medical history, family history, social history, meds, and allergies were reviewed.  Physical Exam:   Vital signs:  BP 129/74  Pulse 61  Temp(Src) 98.4 F (36.9 C) (Oral)  Resp 16  SpO2 99% Skin:  She has a tiny abscess on the helix of her right ear measuring no more than 2 mm in size. The larger abscess is in the right lower quadrant of the abdomen. This measures a centimeter in size and is tender, raised, red, and fluctulent. There is no drainage.  Otherwise normal.  No rash.  Procedure:  Verbal informed consent was obtained.  The patient was informed of the risks and benefits of the procedure and understands and accepts.  Identity of the patient was verified verbally and by wristband.   The abscess area described above was prepped with Betadine and alcohol and anesthetized with 1 mL of 2% Xylocaine with epinephrine.  Using a #11 scalpel blade, a singe straight incision was made into the area of fluctulence, yielding a small amount of prurulent drainage.  Routine cultures were obtained.  A sterile pressure dressing was applied.  Assessment:  The encounter diagnosis was Abscess.  Plan:   1.  The following meds were prescribed:   New Prescriptions   MUPIROCIN OINTMENT (BACTROBAN) 2 %    Apply topically 3 (three) times daily.   SULFAMETHOXAZOLE-TRIMETHOPRIM (BACTRIM DS) 800-160 MG PER TABLET    Take 2 tablets by  mouth 2 (two) times daily.   2.  The patient was instructed in symptomatic care and handouts were given. 3.  The patient was instructed to leave the dressing in place and return again in 48 hours for packing removal. 4.  The patient was instructed to apply mupirocin ointment to her nostril twice daily for a month and to take infectious precautions.   Reuben Likes, MD 07/31/11 [phone]

## 2011-07-31 NOTE — Discharge Instructions (Signed)

## 2011-08-03 LAB — CULTURE, ROUTINE-ABSCESS
Culture: NO GROWTH
Special Requests: NORMAL

## 2011-08-15 ENCOUNTER — Emergency Department (HOSPITAL_COMMUNITY): Payer: 59

## 2011-08-15 ENCOUNTER — Encounter: Payer: Self-pay | Admitting: Gynecology

## 2011-08-15 ENCOUNTER — Observation Stay (HOSPITAL_COMMUNITY)
Admission: EM | Admit: 2011-08-15 | Discharge: 2011-08-16 | Disposition: A | Discharge status: Home / Self Care | Payer: 59 | Source: Ambulatory Visit | Attending: Emergency Medicine | Admitting: Emergency Medicine

## 2011-08-15 ENCOUNTER — Emergency Department (HOSPITAL_COMMUNITY)
Admission: EM | Admit: 2011-08-15 | Discharge: 2011-08-15 | Disposition: A | Discharge status: Nursing Home (Includes ICF) | Payer: 59 | Source: Home / Self Care | Attending: Family Medicine | Admitting: Family Medicine

## 2011-08-15 ENCOUNTER — Encounter (HOSPITAL_COMMUNITY): Payer: Self-pay | Admitting: *Deleted

## 2011-08-15 ENCOUNTER — Ambulatory Visit (INDEPENDENT_AMBULATORY_CARE_PROVIDER_SITE_OTHER): Payer: 59 | Admitting: Gynecology

## 2011-08-15 ENCOUNTER — Encounter: Payer: Commercial Managed Care - PPO | Admitting: Gynecology

## 2011-08-15 ENCOUNTER — Ambulatory Visit (INDEPENDENT_AMBULATORY_CARE_PROVIDER_SITE_OTHER): Payer: 59

## 2011-08-15 ENCOUNTER — Other Ambulatory Visit (HOSPITAL_COMMUNITY)
Admission: RE | Admit: 2011-08-15 | Discharge: 2011-08-15 | Disposition: A | Discharge status: Home / Self Care | Payer: 59 | Source: Ambulatory Visit | Attending: Gynecology | Admitting: Gynecology

## 2011-08-15 VITALS — BP 122/72 | Ht 62.75 in | Wt 161.0 lb

## 2011-08-15 DIAGNOSIS — E7800 Pure hypercholesterolemia, unspecified: Secondary | ICD-10-CM | POA: Insufficient documentation

## 2011-08-15 DIAGNOSIS — I2510 Atherosclerotic heart disease of native coronary artery without angina pectoris: Secondary | ICD-10-CM

## 2011-08-15 DIAGNOSIS — E119 Type 2 diabetes mellitus without complications: Secondary | ICD-10-CM | POA: Insufficient documentation

## 2011-08-15 DIAGNOSIS — Z01419 Encounter for gynecological examination (general) (routine) without abnormal findings: Secondary | ICD-10-CM | POA: Insufficient documentation

## 2011-08-15 DIAGNOSIS — Z8542 Personal history of malignant neoplasm of other parts of uterus: Secondary | ICD-10-CM

## 2011-08-15 DIAGNOSIS — G4730 Sleep apnea, unspecified: Secondary | ICD-10-CM | POA: Insufficient documentation

## 2011-08-15 DIAGNOSIS — R079 Chest pain, unspecified: Principal | ICD-10-CM | POA: Insufficient documentation

## 2011-08-15 DIAGNOSIS — R143 Flatulence: Secondary | ICD-10-CM

## 2011-08-15 DIAGNOSIS — R141 Gas pain: Secondary | ICD-10-CM

## 2011-08-15 DIAGNOSIS — I10 Essential (primary) hypertension: Secondary | ICD-10-CM | POA: Insufficient documentation

## 2011-08-15 DIAGNOSIS — R198 Other specified symptoms and signs involving the digestive system and abdomen: Secondary | ICD-10-CM

## 2011-08-15 DIAGNOSIS — E785 Hyperlipidemia, unspecified: Secondary | ICD-10-CM | POA: Insufficient documentation

## 2011-08-15 DIAGNOSIS — R140 Abdominal distension (gaseous): Secondary | ICD-10-CM

## 2011-08-15 DIAGNOSIS — N951 Menopausal and female climacteric states: Secondary | ICD-10-CM

## 2011-08-15 DIAGNOSIS — Z1211 Encounter for screening for malignant neoplasm of colon: Secondary | ICD-10-CM

## 2011-08-15 LAB — POCT I-STAT, CHEM 8
BUN: 27 mg/dL — ABNORMAL HIGH (ref 6–23)
Calcium, Ion: 1.23 mmol/L (ref 1.12–1.32)
Chloride: 105 mEq/L (ref 96–112)
Creatinine, Ser: 0.8 mg/dL (ref 0.50–1.10)
Glucose, Bld: 98 mg/dL (ref 70–99)
HCT: 36 % (ref 36.0–46.0)
Hemoglobin: 12.2 g/dL (ref 12.0–15.0)
Potassium: 3.7 mEq/L (ref 3.5–5.1)
Sodium: 144 mEq/L (ref 135–145)
TCO2: 28 mmol/L (ref 0–100)

## 2011-08-15 LAB — CBC
HCT: 35.8 % — ABNORMAL LOW (ref 36.0–46.0)
Hemoglobin: 12.1 g/dL (ref 12.0–15.0)
MCH: 30.7 pg (ref 26.0–34.0)
MCHC: 33.8 g/dL (ref 30.0–36.0)
MCV: 90.9 fL (ref 78.0–100.0)
Platelets: 263 10*3/uL (ref 150–400)
RBC: 3.94 MIL/uL (ref 3.87–5.11)
RDW: 12.6 % (ref 11.5–15.5)
WBC: 8.1 10*3/uL (ref 4.0–10.5)

## 2011-08-15 LAB — D-DIMER, QUANTITATIVE: D-Dimer, Quant: 0.22 ug/mL-FEU (ref 0.00–0.48)

## 2011-08-15 LAB — CARDIAC PANEL(CRET KIN+CKTOT+MB+TROPI)
CK, MB: 1.6 ng/mL (ref 0.3–4.0)
Relative Index: INVALID (ref 0.0–2.5)
Total CK: 98 U/L (ref 7–177)
Troponin I: <0.3 ng/mL (ref <0.30)

## 2011-08-15 LAB — TROPONIN I: Troponin I: <0.3 ng/mL (ref <0.30)

## 2011-08-15 LAB — POCT I-STAT TROPONIN I: Troponin i, poc: 0.06 ng/mL (ref 0.00–0.08)

## 2011-08-15 MED ORDER — SODIUM CHLORIDE 0.9 % IV SOLN
1000.0000 mL | INTRAVENOUS | Status: DC
  Administered 2011-08-15: 1000 mL via INTRAVENOUS

## 2011-08-15 MED ORDER — METOPROLOL TARTRATE 25 MG PO TABS: 100.0000 mg | ORAL_TABLET | Freq: Once | ORAL | Status: DC

## 2011-08-15 MED ORDER — METOPROLOL TARTRATE 25 MG PO TABS
50.0000 mg | ORAL_TABLET | Freq: Once | ORAL | Status: DC
  Filled 2011-08-15: qty 2

## 2011-08-15 NOTE — ED Provider Notes (Signed)
History     CSN: [id number]  Arrival date & time 08/15/11  1825   First MD Initiated Contact with Patient 08/15/11 1834      Chief Complaint  Patient presents with  . Chest Pain    (Consider location/radiation/quality/duration/timing/severity/associated sxs/prior treatment) HPI Comments: Emileigh presents for evaluation of central substernal chest pain that started around 5:00 today. She reports that she was shopping with her daughter when she felt immediate onset of sharp pain that radiated to her right shoulder blade. She reports that she also felt "clammy", short of breath, nauseous. She reports that the pain is worse with activity, with deep breathing; she reports that it is better with rest. She denies any previous episodes of similar symptoms. She does report a past medical history significant for hypertension, diabetes, dyslipidemia. She is not on a statin, but was taken off the fenofibrate secondary to muscle pains. She denies any visual disturbance today. EKG at the urgent care Center shows normal sinus rhythm, a rate of 68 beats per minute, with T-wave inversions in V1, no R wave in lead 1, these changes were new when compared to an EKG in August 2006.  Patient is a 51 y.o. female presenting with chest pain. The history is provided by the patient.  Chest Pain The chest pain began 1 - 2 hours ago. Chest pain occurs frequently. The chest pain is unchanged. The pain is associated with exertion and breathing. The severity of the pain is moderate. The quality of the pain is described as aching and sharp. The pain radiates to the mid back and right shoulder. Chest pain is worsened by deep breathing and exertion. Primary symptoms include nausea. Pertinent negatives for primary symptoms include no shortness of breath, no cough, no palpitations, no abdominal pain, no vomiting and no dizziness.  Her past medical history is significant for diabetes, hyperlipidemia and hypertension. Family history  comments: she is adopted     Past Medical History  Diagnosis Date  . Hyperlipidemia   . Sleep apnea   . Diabetes mellitus   . Hypertension   . Adenomatous polyp 2011    BENIGN  . Cancer 2006    STAGE 1 ENDOMETRIOID CARCINOMA    Past Surgical History  Procedure Date  . Breast surgery 2000    BREAST REDUCTION  . Tonsillectomy   . Cesarean section   . Abdominal hysterectomy 11/2004    TAH    Family History  Problem Relation Age of Onset  . Adopted: Yes    History  Substance Use Topics  . Smoking status: Never Smoker   . Smokeless tobacco: Never Used  . Alcohol Use: No    OB History    Grav Para Term Preterm Abortions TAB SAB Ect Mult Living   3 2   1  1   2       Review of Systems  Constitutional: Negative.   HENT: Negative.   Eyes: Negative.   Respiratory: Negative.  Negative for cough and shortness of breath.   Cardiovascular: Positive for chest pain. Negative for palpitations.  Gastrointestinal: Positive for nausea. Negative for vomiting and abdominal pain.  Genitourinary: Negative.   Musculoskeletal: Negative.   Skin: Negative.   Neurological: Negative.  Negative for dizziness.    Allergies  Review of patient's allergies indicates no known allergies.  Home Medications   Current Outpatient Rx  Name Route Sig Dispense Refill  . ASPIRIN 81 MG PO TABS Oral Take 81 mg by mouth daily.      Marland Kitchen  BENAZEPRIL HCL 40 MG PO TABS Oral Take 40 mg by mouth daily.      . FENOFIBRATE 160 MG PO TABS Oral Take 160 mg by mouth daily.      . OMEGA-3 FATTY ACIDS 1000 MG PO CAPS Oral Take by mouth daily.      . IBUPROFEN 800 MG PO TABS Oral Take 800 mg by mouth every 8 (eight) hours as needed.      Marland Kitchen METFORMIN HCL ER (MOD) 500 MG PO TB24 Oral Take 1,000 mg by mouth 2 (two) times daily with a meal.     . SITAGLIPTIN PHOSPHATE 100 MG PO TABS Oral Take 50 mg by mouth daily.        BP 152/84  Pulse 78  Temp(Src) 98.6 F (37 C) (Oral)  Resp 20  SpO2 100%  Physical Exam   Nursing note and vitals reviewed. Constitutional: She is oriented to person, place, and time. She appears well-developed and well-nourished.  HENT:  Head: Normocephalic and atraumatic.  Eyes: Conjunctivae, EOM and lids are normal. Pupils are equal, round, and reactive to light.  Neck: Normal range of motion.  Cardiovascular: Normal rate, regular rhythm, S1 normal and S2 normal.   Murmur heard.  Systolic murmur is present with a grade of 1/6  Pulmonary/Chest: Effort normal. She has no decreased breath sounds. She has no wheezes. She has no rhonchi. She has no rales. She exhibits no tenderness.    Musculoskeletal: Normal range of motion.  Neurological: She is alert and oriented to person, place, and time.  Skin: Skin is warm and dry.  Psychiatric: Her behavior is normal.    ED Course  Procedures (including critical care time)  Labs Reviewed - No data to display No results found.   1. Chest pain       MDM  Transferred to Emergency Department for further evaluation of chest pain        Renaee Munda, MD 08/15/11 [phone]

## 2011-08-15 NOTE — ED Notes (Signed)
C/o stabbing chest pain radiating to right shoulder blade and straight through to middle of back.  Started about 1 1/2 hrs ago.  Also c/o short of breath w/ pain and taking in deep breaths and walking.  Pt denies any hx of heart disease.  Also c/o "a little nauseated right now"

## 2011-08-15 NOTE — ED Notes (Signed)
Patient at the store today when she had an acute onset of chest pain radiating to her shoulder blades.  Patient states that she became lightheaded and nauseated with pain worse when she takes a deep breath.

## 2011-08-15 NOTE — Discharge Instructions (Signed)
Transferred via shuttle to Emergency Department for further evaluation of chest pain.

## 2011-08-15 NOTE — Patient Instructions (Signed)
The total amount of calcium that she should be taking daily should be 1200 mg and a vitamin D should be between 800,000 units daily. Remember to schedule your appointment with her gastroenterologist.

## 2011-08-15 NOTE — ED Notes (Signed)
Patient was  Out shopping and had a sudden onset of sharp chest pain. States was substernal and went back between her shoulder blades.  Pain is 2/10 now and feels like bad indigestion. Nausea  And lightheadedness persist.

## 2011-08-15 NOTE — Progress Notes (Signed)
Sandra [name] 29-Apr-1961 [id number]   History:    51 y.o.  for annual exam who in 2006 had a colon, hysterectomy as a result of menorrhagia and a preoperative endometrial biopsy which had demonstrated complex hyperplasia without atypia. 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 No invasion was identified. The cervix was negative for carcinoma and she had leiomyomatous uteri as well. The case had been discussed with the GYN oncologist (Dr. Emmaline Kluver) at the [facility]) and had recommended no further intervention at that time and she had been followed closely every 6 months and has been cancer free since then. She is getting annual CA 125 along with a pelvic ultrasound. Patient stated that for the past several months she has been complaining of bloating sensation and rectal pressure. She does suffer from constipation. She had been followed by gas urologist in [facility] whereby her last colonoscopy in 2011 had demonstrated benign colon polyps she denies any hematochezia. Dr. Doristine Counter is her primary physician who is been following her for her hypertension, hypercholesterolemia, and type 2 diabetes. Her lab work has been done by her in the last 6 months. She has suffered from some mild vasomotor symptoms. She has lost 30 pounds over the past several months since she has been going to [employer].  Past medical history,surgical history, family history and social history were all reviewed and documented in the EPIC chart.  Gynecologic History No LMP recorded. Patient has had a hysterectomy. Contraception: none Last Pap: 2011. Results were: normal Last mammogram: 2013. Results were: normal  Obstetric History OB History    Grav Para Term Preterm Abortions TAB SAB Ect Mult Living   3 2   1  1   2      # Outc Date GA Lbr  Len/2nd Wgt Sex Del Anes PTL Lv   1 PAR            2 PAR            3 SAB                ROS:  Was performed and pertinent positives and negatives are included in the history.  Exam: chaperone present  BP 122/72  Ht 5' 2.75" (1.594 m)  Wt 161 lb (73.029 kg)  BMI 28.75 kg/m2  Body mass index is 28.75 kg/(m^2).  General appearance : Well developed well nourished female. No acute distress HEENT: Neck supple, trachea midline, no carotid bruits, no thyroidmegaly Lungs: Clear to auscultation, no rhonchi or wheezes, or rib retractions  Heart: Regular rate and rhythm, no murmurs or gallops Breast:Examined in sitting and supine position were symmetrical in appearance, no palpable masses or tenderness,  no skin retraction, no nipple inversion, no nipple discharge, no skin discoloration, no axillary or supraclavicular lymphadenopathy Abdomen: no palpable masses or tenderness, no rebound or guarding Extremities: no edema or skin discoloration or tenderness  Pelvic:  Bartholin, Urethra, Skene Glands: Within normal limits             Vagina: No gross lesions or discharge  Cervix: Absent             Uterus: Absent  Adnexa  Without masses or tenderness  Anus and perineum  normal   Rectovaginal  normal sphincter tone without palpated masses or tenderness  Hemoccult no masses palpated. Fecal occult blood testing card provided for patient to submit to the office for testing at a later date.   Ultrasound today was done as a result of her past history of uterine cancer and her still having her ovaries as well as because of the complaints of bloating and constipation:   Ultrasound report: Absent uterus normal right ovary with microcalcification. Left ovary normal with microcalcification less than 3 mm. Negative cul-de-sac. No apparent masses seen on right or left adnexa.   Assessment/Plan:  51 y.o. female for annual exam with complaint of bloating constipation rectal pressure.  Patient with prior history of benign colonic polyps in 2011. Patient denied any hematochezia. 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 No invasion was identified. The cervix was negative for carcinoma and she had leiomyomatous uteri as well. A CA 125 was drawn today resulting in time of this dictation. Pap smear was also done today. It is recommended patient to followup with her gastroenterologist because of her worsening constipation and bloating sensation and past history of benign colonic polyps. Fecal occult blood testing cards were provided for her to submit to the office. She was instructed take her calcium vitamin D for osteoporosis prevention as well and to do her monthly supple examination.   Ok Edwards MD, 10:36 AM 08/15/2011

## 2011-08-15 NOTE — ED Provider Notes (Signed)
History     CSN: [id number]  Arrival date & time 08/15/11  1914   First MD Initiated Contact with Patient 08/15/11 2010      Chief Complaint  Patient presents with  . Chest Pain    Location-chest/radiation-to shoulders/quality-dull/duration-<3 hours/timing-constant/severity-mild/associated sxs-nausea and diaphoresis/No prior treatment) Patient is a 51 y.o. female presenting with chest pain. The history is provided by the patient. No language interpreter was used.  Chest Pain The chest pain began 3 - 5 hours ago. Chest pain occurs constantly. The chest pain is resolved. The pain is associated with breathing and exertion. At its most intense, the pain is at 8/10. The pain is currently at 2/10. The severity of the pain is mild. The quality of the pain is described as dull. The pain radiates to the left shoulder and right shoulder. Chest pain is worsened by deep breathing. Primary symptoms include nausea. Pertinent negatives for primary symptoms include no fever, no fatigue, no syncope, no shortness of breath, no cough, no wheezing, no palpitations, no abdominal pain, no vomiting, no dizziness and no altered mental status.  Pertinent negatives for associated symptoms include no diaphoresis, no lower extremity edema, no near-syncope, no numbness and no weakness. She tried nothing for the symptoms. Risk factors include no known risk factors.  Her past medical history is significant for diabetes, hyperlipidemia and hypertension.  Pertinent negatives for past medical history include no CAD, no MI and no seizures.  Procedure history is negative for cardiac catheterization and echocardiogram.     Past Medical History  Diagnosis Date  . Hyperlipidemia   . Sleep apnea   . Diabetes mellitus   . Hypertension   . Adenomatous polyp 2011    BENIGN  . Cancer 2006    STAGE 1 ENDOMETRIOID CARCINOMA    Past Surgical History  Procedure Date  . Breast surgery 2000    BREAST REDUCTION  .  Tonsillectomy   . Cesarean section   . Abdominal hysterectomy 11/2004    TAH    Family History  Problem Relation Age of Onset  . Adopted: Yes    History  Substance Use Topics  . Smoking status: Never Smoker   . Smokeless tobacco: Never Used  . Alcohol Use: No    OB History    Grav Para Term Preterm Abortions TAB SAB Ect Mult Living   3 2   1  1   2       Review of Systems  Constitutional: Negative for fever, chills, diaphoresis and fatigue.  HENT: Negative for trouble swallowing, neck pain and neck stiffness.   Eyes: Negative for pain, discharge and itching.  Respiratory: Negative for cough, chest tightness, shortness of breath and wheezing.   Cardiovascular: Positive for chest pain. Negative for palpitations, leg swelling, syncope and near-syncope.  Gastrointestinal: Positive for nausea. Negative for vomiting, abdominal pain, diarrhea, constipation and blood in stool.  Genitourinary: Negative for dysuria, urgency, frequency, hematuria, flank pain, decreased urine volume, difficulty urinating and pelvic pain.  Musculoskeletal: Negative for back pain and joint swelling.  Skin: Negative for rash and wound.  Neurological: Negative for dizziness, tremors, seizures, syncope, facial asymmetry, speech difficulty, weakness, light-headedness, numbness and headaches.  Hematological: Negative for adenopathy. Does not bruise/bleed easily.  Psychiatric/Behavioral: Negative for confusion, decreased concentration and altered mental status.    Allergies  Review of patient's allergies indicates no known allergies.  Home Medications   Current Outpatient Rx  Name Route Sig Dispense Refill  . BENAZEPRIL HCL 40 MG PO  TABS Oral Take 40 mg by mouth daily.      . FENOFIBRATE 160 MG PO TABS Oral Take 160 mg by mouth daily.      . IBUPROFEN 800 MG PO TABS Oral Take 800 mg by mouth every 8 (eight) hours as needed. For pain     . METFORMIN HCL ER (MOD) 500 MG PO TB24 Oral Take 1,000 mg by mouth 2  (two) times daily with a meal.     . FISH OIL 1000 MG PO CAPS Oral Take 2 capsules by mouth 2 (two) times daily.    Marland Kitchen SITAGLIPTIN PHOSPHATE 100 MG PO TABS Oral Take 100 mg by mouth daily.       BP 142/90  Pulse 63  Temp(Src) 98.7 F (37.1 C) (Oral)  Resp 20  SpO2 97%  Physical Exam  Constitutional: She is oriented to person, place, and time. She appears well-developed and well-nourished. No distress.  HENT:  Head: Normocephalic and atraumatic.  Eyes: Conjunctivae are normal. Right eye exhibits no discharge. Left eye exhibits no discharge. No scleral icterus.  Neck: Normal range of motion. Neck supple. No JVD present.  Cardiovascular: Normal rate, regular rhythm, normal heart sounds and intact distal pulses.   No murmur heard. Pulmonary/Chest: Effort normal and breath sounds normal. No stridor. No respiratory distress. She has no wheezes. She has no rales. She exhibits no tenderness.  Abdominal: Soft. Bowel sounds are normal. She exhibits no distension and no mass. There is no tenderness. There is no rebound and no guarding.  Musculoskeletal: Normal range of motion. She exhibits no edema and no tenderness.  Neurological: She is alert and oriented to person, place, and time.  Skin: Skin is warm and dry. She is not diaphoretic.  Psychiatric: She has a normal mood and affect.    ED Course  Procedures (including critical care time)  Labs Reviewed  CBC - Abnormal; Notable for the following:    HCT 35.8 (*)    All other components within normal limits  POCT I-STAT, CHEM 8 - Abnormal; Notable for the following:    BUN 27 (*)    All other components within normal limits  CARDIAC PANEL(CRET KIN+CKTOT+MB+TROPI)  POCT I-STAT TROPONIN I  D-DIMER, QUANTITATIVE  TROPONIN I   Dg Chest 2 View  08/15/2011  *RADIOLOGY REPORT*  Clinical Data: Chest pain.  CHEST - 2 VIEW  Comparison: PA and lateral chest 05/19/2008 and 12/19/2004.  Findings: Lungs are clear.  Heart size is normal.  No  pneumothorax or effusion.  IMPRESSION: Negative chest.  Original Report Authenticated By: Bernadene Bell. D'ALESSIO, M.D.     1. Chest pain      Date: 08/15/2011  Rate: 62  Rhythm: normal sinus rhythm  QRS Axis: normal  Intervals: normal  ST/T Wave abnormalities: normal  Conduction Disutrbances:none  Narrative Interpretation:   Old EKG Reviewed: unchanged   MDM  Pt is a well appearing 51yo F with no cardiac hx who was sent by urgent care for chest pain that started several hours ago as pt was getting out of her car to shop. Mild pleuritic component and radiation to shoulders. VSS. AF. NAD. No concerning exam findings.   Labs unremarkable including d-dimer. CP free presently. Pt transferred to CDU with plans for CT in am. Pt and family in agreement with plan. Care of pt transferred to nurse practitioner.         Consuello Masse, MD 08/15/11 [phone]

## 2011-08-16 ENCOUNTER — Observation Stay (HOSPITAL_COMMUNITY)
Admit: 2011-08-16 | Discharge: 2011-08-16 | Disposition: A | Discharge status: Home / Self Care | Payer: 59 | Attending: Emergency Medicine | Admitting: Emergency Medicine

## 2011-08-16 LAB — CA 125: CA 125: 3.4 U/mL (ref 0.0–30.2)

## 2011-08-16 LAB — POCT I-STAT TROPONIN I: Troponin i, poc: 0 ng/mL (ref 0.00–0.08)

## 2011-08-16 LAB — FOLLICLE STIMULATING HORMONE: FSH: 44.6 m[IU]/mL

## 2011-08-16 MED ORDER — METOPROLOL TARTRATE 1 MG/ML IV SOLN
INTRAVENOUS | Status: AC
  Filled 2011-08-16: qty 10

## 2011-08-16 MED ORDER — IOHEXOL 350 MG/ML SOLN
80.0000 mL | Freq: Once | INTRAVENOUS | Status: AC | PRN
  Administered 2011-08-16: 80 mL via INTRAVENOUS

## 2011-08-16 MED ORDER — NITROGLYCERIN 0.4 MG SL SUBL
0.4000 mg | SUBLINGUAL_TABLET | Freq: Once | SUBLINGUAL | Status: AC
  Administered 2011-08-16: 0.4 mg via SUBLINGUAL
  Filled 2011-08-16: qty 25

## 2011-08-16 NOTE — ED Notes (Signed)
Patient transported to CT 

## 2011-08-16 NOTE — ED Provider Notes (Cosign Needed Addendum)
Sign out given by Dr. Nicanor Alcon. Patient is lying comfortably in bed without complaint. VSS. Afebrile. States last episode of chest pain at midnight with Dr. Nicanor Alcon stating non provocative EKG this morning. She is on CPP and awaiting cardiac CT. dispo pending.   Sandra [name], [place] 08/16/11 [phone]  Dr. Ty Hilts radiology reports 0-25% stenosis that is non occlusive of LAD with a calcium score of 48. Patient has PCP established and will give cardiology referral. Patient voices understanding and is agreeable to plan. Strict precautions given. No CP today with normal EKG's.  Sandra [name], [place] 08/16/11 1145

## 2011-08-16 NOTE — ED Provider Notes (Signed)
I saw and evaluated the patient, reviewed the resident's note and I agree with the findings and plan.   .Face to face Exam:  General:  Awake HEENT:  Atraumatic Resp:  Normal effort Abd:  Nondistended Neuro:No focal weakness Lymph: No adenopathy   Nelia Shi, MD 08/16/11 2044

## 2011-08-16 NOTE — Discharge Instructions (Signed)
Metformin and X-ray Contrast Studies For some X-ray exams, a contrast dye is used. Contrast dye is a type of medicine used to make the X-ray image clearer. The contrast dye is given to the patient through a vein (intravenously). If you need to have this type of X-ray exam and you take a medication called metformin, your caregiver may have you stop taking metformin before the exam.  LACTIC ACIDOSIS In rare cases, a serious medical condition called lactic acidosis can develop in people who take metformin and receive contrast dye. The following conditions can increase the risk of this complication:   Kidney failure.   Liver problems.   Certain types of heart problems such as:   Heart failure.   Heart attack.   Heart infection.   Heart valve problems.   Alcohol abuse.  If left untreated, lactic acidosis can lead to coma.  SYMPTOMS OF LACTIC ACIDOSIS Symptoms of lactic acidosis can include:  Rapid breathing (hyperventilation).   Neurologic symptoms such as:   Headaches.   Confusion.   Dizziness.   Excessive sweating.   Feeling sick to your stomach (nauseous) or throwing up (vomiting).  AFTER THE X-RAY EXAM  Stay well-hydrated. Drink fluids as instructed by your caregiver.   If you have a risk of developing lactic acidosis, blood tests may be done to make sure your kidney function is okay.   Metformin is usually stopped for 48 hours after the X-ray exam. Ask your caregiver when you can start taking metformin again.  SEEK MEDICAL CARE IF:   You have shortness of breath or difficulty breathing.   You develop a headache that does not go away.   You have nausea or vomiting.   You urinate more than normal.   You develop a skin rash and have:   Redness.   Swelling.   Itching.  Document Released: 04/03/2009 Document Revised: 04/04/2011 Document Reviewed: 04/03/2009 [employer] Patient Information 2012 [employer]. 

## 2011-08-16 NOTE — ED Notes (Signed)
BMI 29.4

## 2011-08-16 NOTE — Discharge Instructions (Signed)
It is very important establish followup with both her primary care physician and cardiologist in the near future to further discuss your newly diagnosed coronary artery disease and reducing your future risk of cardiac problems and ongoing management of your current hypertension, diabetes, and high cholesterol. If at any time you have changing or worsening of symptoms, then return immediately to emergency department.  Coronary Artery Disease Coronary artery disease (CAD) happens when the arteries of the heart become narrow and hardened. Buildup of plaque in the walls of the vessels blocks blood flow. This can cause minor chest pain (angina) if the blockage is partial. A heart attack or MI (myocardial infarction) happens when the artery is completely blocked. MIs can lead to shock, heart failure, and sudden death. CAD is the leading cause of death in men and women.  CAUSES  The risk for getting CAD can be inherited. There are other risk factors that can be helped. These factors include:  Cigarette smoking.   Diabetes.   Cholesterol.   High blood pressure.  Being overweight and not exercising regularly also increase your risk for having CAD.  SYMPTOMS  Chest pain.   Shortness of breath.   Weakness.   Nausea.   Fainting.  DIAGNOSIS  The diagnosis may require:  An EKG.   Stress test.   Chest X-ray.   Cardiac scan.   Echocardiogram.   A coronary angiogram.  TREATMENT  Treatment includes immediate measures for symptom relief. Medicines for chest pain, cholesterol reduction, blood pressure control, and aspirin to prevent clotting may be needed. Coronary angioplasty (using a balloon to open a blockage in a coronary artery) is helpful in managing MIs. It is also useful for some patients with angina. Losing weight, controlling the blood sugar, and not smoking are also important to long-term success. The optimal diet should emphasize fruits and vegetables. Sodas, deep-fried foods, and  sweets should be avoided.  SEEK IMMEDIATE MEDICAL CARE IF:  You develop severe chest pain or pain that does not improve with rest and medicine.   You develop pain that radiates into the arms, neck, jaw, or back.   You develop fainting, shortness of breath, severe palpitations, or vomiting.   You start sweating a lot.  Document Released: 05/23/2004 Document Revised: 04/04/2011 Document Reviewed: 04/13/2008 [facility] Patient Information 2012 [place], [place].

## 2011-08-16 NOTE — Progress Notes (Signed)
Observation review is complete. 

## 2011-08-16 NOTE — ED Provider Notes (Signed)
Medical screening examination/treatment/procedure(s) were performed by non-physician practitioner and as supervising physician I was immediately available for consultation/collaboration.  Jasmine Awe, MD 08/16/11 [phone]

## 2011-08-27 ENCOUNTER — Encounter: Payer: Self-pay | Admitting: Cardiovascular Disease

## 2011-08-27 ENCOUNTER — Ambulatory Visit (INDEPENDENT_AMBULATORY_CARE_PROVIDER_SITE_OTHER): Payer: 59 | Admitting: Cardiovascular Disease

## 2011-08-27 VITALS — BP 120/74 | HR 51 | Ht 63.0 in | Wt 163.0 lb

## 2011-08-27 DIAGNOSIS — I2510 Atherosclerotic heart disease of native coronary artery without angina pectoris: Secondary | ICD-10-CM | POA: Insufficient documentation

## 2011-08-27 NOTE — Progress Notes (Signed)
HPI:  This is a very nice 51 year-old woman presenting for initial cardiac evaluation. She works as as Loss adjuster, chartered at [employer]. She developed chest pain April18th and was evaluated in the Emergency Dept. She ruled out for MI with negative enzymes and normal EKG's. She had a coronary CTA demonstrating nonobstructive plaque in the mid-LAD. She presents today for further evaluation.  The patient had sharp chest pain that began while she was shopping. It was located in the substernal region and was associated with diaphoresis, nausea, and diarrhea. She felt weak all over. The episode lasted a few hours, and symptoms have not recurred. She exercises regularly without exertional symptoms. She specifically denies exertional chest pain or pressure. There is no dyspnea, edema, or palpitations. She notes that she has lost 35 pounds over the past year. She's been told she has high cholesterol in the past, but was intolerant to [employer] because of myalgias. She is hoping that her labs will look good after successfully losing weight.  Outpatient Encounter Prescriptions as of 08/27/2011  Medication Sig Dispense Refill  . benazepril (LOTENSIN) 40 MG tablet Take 40 mg by mouth daily.        Marland Kitchen ibuprofen (ADVIL,MOTRIN) 800 MG tablet Take 800 mg by mouth every 8 (eight) hours as needed. For pain       . metFORMIN (GLUMETZA) 500 MG (MOD) 24 hr tablet Take 1,000 mg by mouth 2 (two) times daily with a meal.       . Omega-3 Fatty Acids (FISH OIL) 1000 MG CAPS Take 2 capsules by mouth 2 (two) times daily.      . sitaGLIPtin (JANUVIA) 100 MG tablet Take 100 mg by mouth daily.       Marland Kitchen DISCONTD: fenofibrate 160 MG tablet Take 160 mg by mouth daily.          Review of patient's allergies indicates no known allergies.  Past Medical History  Diagnosis Date  . Hyperlipidemia   . Sleep apnea   . Diabetes mellitus   . Hypertension   . Adenomatous polyp 2011    BENIGN  . Cancer 2006    STAGE 1 ENDOMETRIOID CARCINOMA      Past Surgical History  Procedure Date  . Breast surgery 2000    BREAST REDUCTION  . Tonsillectomy   . Cesarean section   . Abdominal hysterectomy 11/2004    TAH    History   Social History  . Marital Status: Married    Spouse Name: N/A    Number of Children: N/A  . Years of Education: N/A   Occupational History  . Not on file.   Social History Main Topics  . Smoking status: Never Smoker   . Smokeless tobacco: Never Used  . Alcohol Use: No  . Drug Use: No  . Sexually Active: Yes    Birth Control/ Protection: Surgical     HYST   Other Topics Concern  . Not on file   Social History Narrative  . No narrative on file    Family History  Problem Relation Age of Onset  . Adopted: Yes  Family history is unknown as she is adopted.  ROS: General: no fevers/chills/night sweats Eyes: no blurry vision, diplopia, or amaurosis ENT: no sore throat or hearing loss Resp: no cough, wheezing, or hemoptysis CV: no edema or palpitations GI: no abdominal pain, nausea, vomiting, diarrhea, or constipation GU: no dysuria, frequency, or hematuria. Bladder prolapse with upcoming surgical repair Skin: no rash Neuro: no  headache, numbness, tingling, or weakness of extremities Musculoskeletal: positive for diffuse myalgias Heme: no bleeding, DVT, or easy bruising Endo: no polydipsia or polyuria  BP 120/74  Pulse 51  Ht 5\' 3"  (1.6 m)  Wt 73.936 kg (163 lb)  BMI 28.87 kg/m2  PHYSICAL EXAM: Pt is alert and oriented, WD, WN, in no distress. HEENT: normal Neck: JVP normal. Carotid upstrokes normal without bruits. No thyromegaly. Lungs: equal expansion, clear bilaterally CV: Apex is discrete and nondisplaced, RRR without murmur or gallop Abd: soft, NT, +BS, no bruit, no hepatosplenomegaly Back: no CVA tenderness Ext: no C/C/E        Femoral pulses 2+= without bruits        DP/PT pulses intact and = Skin: warm and dry without rash Neuro: CNII-XII intact             Strength  intact = bilaterally  EKG:  Sinus bradycardia 50 bpm, otherwise within normal limits  ASSESSMENT AND PLAN:

## 2011-08-27 NOTE — Assessment & Plan Note (Signed)
The patient has nonobstructive CAD by coronary CTA. This is an incidental finding and I don't think it has anything to do with the chest pain that occurred when she was evaluated in the ER. However, it does have obvious implications with respect to target of therapies for HTN and hyperlipidemia. The patient is going to have follow-up lipids with Dr Doristine Counter in June, and her goal LDL is less than 100 mg/dL in the setting of CAD. I think the chance of her being able to tolerate a statin is pretty low considering her myalgias. Hopefully the exercise and weight loss will have her at goal. I would be happy to see her back in follow-up as needed. I have not recommended any further cardiac testing at this point. Her BP is at goal and she is on appropriate therapy with an ACE-inhibitor in the setting of diabetes and CAD.

## 2011-08-27 NOTE — Patient Instructions (Signed)
Your physician recommends that you schedule a follow-up appointment as needed with Dr Cooper.    

## 2011-09-03 ENCOUNTER — Other Ambulatory Visit: Payer: Self-pay | Admitting: Urology

## 2011-09-03 ENCOUNTER — Encounter (HOSPITAL_BASED_OUTPATIENT_CLINIC_OR_DEPARTMENT_OTHER): Payer: Self-pay | Admitting: *Deleted

## 2011-09-03 NOTE — Progress Notes (Addendum)
NPO AFTER MN. ARRIVES AT 0730. NEEDS ISTAT. CURRENT EKG W/ CHART AND EPIC. REVIEWED [facility] GUIDELINES, WILL BRING MEDS. AND CPAP. AND PT TO BRING ANES. LETTER. DOS (THIS NEEDS TO BE COPYING AND PUT W/ CHART)  REVIEWED CHART W/ DR SCHUSTER MDA, INCLUDING PT STATES HAS LETTER FROM [place] [facility]ES. ABOUT VERY SMALL AIRWAY.  STATES NEED PREV. ANES. RECORD FROM 2006 AT [facility].  CALLED [facility] FOR ANES. RECORDS AND NOTES.

## 2011-09-04 ENCOUNTER — Ambulatory Visit (AMBULATORY_SURGERY_CENTER): Payer: 59 | Admitting: *Deleted

## 2011-09-04 VITALS — Ht 62.0 in | Wt 160.2 lb

## 2011-09-04 DIAGNOSIS — R140 Abdominal distension (gaseous): Secondary | ICD-10-CM

## 2011-09-04 DIAGNOSIS — R141 Gas pain: Secondary | ICD-10-CM

## 2011-09-04 DIAGNOSIS — R143 Flatulence: Secondary | ICD-10-CM

## 2011-09-04 DIAGNOSIS — K6289 Other specified diseases of anus and rectum: Secondary | ICD-10-CM

## 2011-09-04 MED ORDER — PEG-KCL-NACL-NASULF-NA ASC-C 100 G PO SOLR: ORAL | Status: DC

## 2011-09-06 ENCOUNTER — Encounter (HOSPITAL_BASED_OUTPATIENT_CLINIC_OR_DEPARTMENT_OTHER): Payer: Self-pay | Admitting: Anesthesiology

## 2011-09-06 ENCOUNTER — Ambulatory Visit (HOSPITAL_BASED_OUTPATIENT_CLINIC_OR_DEPARTMENT_OTHER)
Admission: RE | Admit: 2011-09-06 | Discharge: 2011-09-07 | Disposition: A | Discharge status: Home / Self Care | Payer: 59 | Source: Ambulatory Visit | Attending: Urology | Admitting: Urology

## 2011-09-06 ENCOUNTER — Encounter (HOSPITAL_BASED_OUTPATIENT_CLINIC_OR_DEPARTMENT_OTHER)
Admission: RE | Disposition: A | Discharge status: Home / Self Care | Payer: Self-pay | Source: Ambulatory Visit | Attending: Urology

## 2011-09-06 ENCOUNTER — Encounter (HOSPITAL_BASED_OUTPATIENT_CLINIC_OR_DEPARTMENT_OTHER): Payer: Self-pay | Admitting: *Deleted

## 2011-09-06 ENCOUNTER — Ambulatory Visit (HOSPITAL_BASED_OUTPATIENT_CLINIC_OR_DEPARTMENT_OTHER): Payer: 59 | Admitting: Anesthesiology

## 2011-09-06 DIAGNOSIS — Z90710 Acquired absence of both cervix and uterus: Secondary | ICD-10-CM | POA: Insufficient documentation

## 2011-09-06 DIAGNOSIS — G4730 Sleep apnea, unspecified: Secondary | ICD-10-CM | POA: Insufficient documentation

## 2011-09-06 DIAGNOSIS — I10 Essential (primary) hypertension: Secondary | ICD-10-CM | POA: Insufficient documentation

## 2011-09-06 DIAGNOSIS — N3946 Mixed incontinence: Secondary | ICD-10-CM | POA: Insufficient documentation

## 2011-09-06 DIAGNOSIS — E7800 Pure hypercholesterolemia, unspecified: Secondary | ICD-10-CM

## 2011-09-06 DIAGNOSIS — Z79899 Other long term (current) drug therapy: Secondary | ICD-10-CM | POA: Insufficient documentation

## 2011-09-06 DIAGNOSIS — Z8542 Personal history of malignant neoplasm of other parts of uterus: Secondary | ICD-10-CM | POA: Insufficient documentation

## 2011-09-06 DIAGNOSIS — E119 Type 2 diabetes mellitus without complications: Secondary | ICD-10-CM | POA: Insufficient documentation

## 2011-09-06 DIAGNOSIS — I2510 Atherosclerotic heart disease of native coronary artery without angina pectoris: Secondary | ICD-10-CM

## 2011-09-06 DIAGNOSIS — N3641 Hypermobility of urethra: Secondary | ICD-10-CM | POA: Insufficient documentation

## 2011-09-06 HISTORY — DX: Adverse effect of unspecified anesthetic, initial encounter: T41.45XA

## 2011-09-06 HISTORY — DX: Atherosclerotic heart disease of native coronary artery without angina pectoris: I25.10

## 2011-09-06 HISTORY — DX: Other complications of anesthesia, initial encounter: T88.59XA

## 2011-09-06 HISTORY — PX: PUBOVAGINAL SLING: SHX1035

## 2011-09-06 HISTORY — PX: CYSTOSCOPY: SHX5120

## 2011-09-06 HISTORY — DX: Obstructive sleep apnea (adult) (pediatric): G47.33

## 2011-09-06 HISTORY — DX: Personal history of malignant neoplasm of other parts of uterus: Z85.42

## 2011-09-06 HISTORY — DX: Obstructive sleep apnea (adult) (pediatric): Z99.89

## 2011-09-06 LAB — GLUCOSE, CAPILLARY
Glucose-Capillary: 99 mg/dL (ref 70–99)
Glucose-Capillary: 125 mg/dL — ABNORMAL HIGH (ref 70–99)

## 2011-09-06 LAB — POCT I-STAT 4, (NA,K, GLUC, HGB,HCT)
Glucose, Bld: 106 mg/dL — ABNORMAL HIGH (ref 70–99)
HCT: 39 % (ref 36.0–46.0)
Hemoglobin: 13.3 g/dL (ref 12.0–15.0)
Potassium: 3.7 mEq/L (ref 3.5–5.1)
Sodium: 144 mEq/L (ref 135–145)

## 2011-09-06 SURGERY — CYSTOSCOPY
Anesthesia: Spinal | Site: Urethra | Wound class: Clean Contaminated

## 2011-09-06 MED ORDER — DIPHENHYDRAMINE HCL 50 MG/ML IJ SOLN: 12.5000 mg | Freq: Four times a day (QID) | INTRAMUSCULAR | Status: DC | PRN

## 2011-09-06 MED ORDER — HYDROMORPHONE HCL PF 1 MG/ML IJ SOLN: 0.2500 mg | INTRAMUSCULAR | Status: DC | PRN

## 2011-09-06 MED ORDER — MEPERIDINE HCL 25 MG/ML IJ SOLN: 6.2500 mg | INTRAMUSCULAR | Status: DC | PRN

## 2011-09-06 MED ORDER — BISACODYL 10 MG RE SUPP: 10.0000 mg | Freq: Every day | RECTAL | Status: DC | PRN

## 2011-09-06 MED ORDER — SODIUM CHLORIDE 0.9 % IR SOLN
Status: DC | PRN
  Administered 2011-09-06: 10:00:00

## 2011-09-06 MED ORDER — SODIUM CHLORIDE 0.45 % IV SOLN: INTRAVENOUS | Status: DC

## 2011-09-06 MED ORDER — PROPOFOL 10 MG/ML IV EMUL
INTRAVENOUS | Status: DC | PRN
  Administered 2011-09-06: 50 ug/kg/min via INTRAVENOUS

## 2011-09-06 MED ORDER — LIDOCAINE IN DEXTROSE 5-7.5 % IV SOLN
INTRAVENOUS | Status: DC | PRN
  Administered 2011-09-06: 85 mg via INTRATHECAL

## 2011-09-06 MED ORDER — MIDAZOLAM HCL 5 MG/5ML IJ SOLN
INTRAMUSCULAR | Status: DC | PRN
  Administered 2011-09-06 (×2): 1 mg via INTRAVENOUS

## 2011-09-06 MED ORDER — HYDROCODONE-ACETAMINOPHEN 7.5-650 MG PO TABS: 1.0000 | ORAL_TABLET | Freq: Four times a day (QID) | ORAL | Status: AC | PRN

## 2011-09-06 MED ORDER — ACETAMINOPHEN NICU IV SYRINGE 10 MG/ML
15.0000 mg/kg | Freq: Four times a day (QID) | INTRAVENOUS | Status: DC
  Administered 2011-09-06 (×2): 1000 mg via INTRAVENOUS

## 2011-09-06 MED ORDER — STERILE WATER FOR IRRIGATION IR SOLN
Status: DC | PRN
  Administered 2011-09-06: 3000 mL via INTRAVESICAL

## 2011-09-06 MED ORDER — DIPHENHYDRAMINE HCL 12.5 MG/5ML PO ELIX: 12.5000 mg | ORAL_SOLUTION | Freq: Four times a day (QID) | ORAL | Status: DC | PRN

## 2011-09-06 MED ORDER — OXYBUTYNIN CHLORIDE ER 5 MG PO TB24: 5.0000 mg | ORAL_TABLET | Freq: Every day | ORAL | Status: DC

## 2011-09-06 MED ORDER — ONDANSETRON HCL 4 MG/2ML IJ SOLN
INTRAMUSCULAR | Status: DC | PRN
  Administered 2011-09-06: 4 mg via INTRAVENOUS

## 2011-09-06 MED ORDER — PROMETHAZINE HCL 25 MG/ML IJ SOLN: 6.2500 mg | INTRAMUSCULAR | Status: DC | PRN

## 2011-09-06 MED ORDER — LINAGLIPTIN 5 MG PO TABS: 5.0000 mg | ORAL_TABLET | Freq: Every day | ORAL | Status: DC

## 2011-09-06 MED ORDER — FENTANYL CITRATE 0.05 MG/ML IJ SOLN
INTRAMUSCULAR | Status: DC | PRN
  Administered 2011-09-06 (×2): 50 ug via INTRAVENOUS

## 2011-09-06 MED ORDER — HYDROMORPHONE HCL PF 1 MG/ML IJ SOLN: 0.5000 mg | INTRAMUSCULAR | Status: DC | PRN

## 2011-09-06 MED ORDER — LIDOCAINE HCL (CARDIAC) 20 MG/ML IV SOLN
INTRAVENOUS | Status: DC | PRN
  Administered 2011-09-06: 30 mg via INTRAVENOUS

## 2011-09-06 MED ORDER — BENAZEPRIL HCL 40 MG PO TABS: 40.0000 mg | ORAL_TABLET | Freq: Every evening | ORAL | Status: DC

## 2011-09-06 MED ORDER — CEFAZOLIN SODIUM 1-5 GM-% IV SOLN
1.0000 g | INTRAVENOUS | Status: AC
  Administered 2011-09-06: 1 g via INTRAVENOUS

## 2011-09-06 MED ORDER — ONDANSETRON HCL 4 MG/2ML IJ SOLN: 4.0000 mg | INTRAMUSCULAR | Status: DC | PRN

## 2011-09-06 MED ORDER — ESTRADIOL 0.1 MG/GM VA CREA
TOPICAL_CREAM | VAGINAL | Status: DC | PRN
  Administered 2011-09-06: 1 via VAGINAL

## 2011-09-06 MED ORDER — LACTATED RINGERS IV SOLN: INTRAVENOUS | Status: DC

## 2011-09-06 MED ORDER — CHLORHEXIDINE GLUCONATE 4 % EX LIQD: Freq: Once | CUTANEOUS | Status: DC

## 2011-09-06 MED ORDER — ZOLPIDEM TARTRATE 5 MG PO TABS: 5.0000 mg | ORAL_TABLET | Freq: Every evening | ORAL | Status: DC | PRN

## 2011-09-06 MED ORDER — OXYBUTYNIN CHLORIDE 5 MG PO TABS: 5.0000 mg | ORAL_TABLET | Freq: Three times a day (TID) | ORAL | Status: DC | PRN

## 2011-09-06 MED ORDER — LACTATED RINGERS IV SOLN
INTRAVENOUS | Status: DC
  Administered 2011-09-06: 09:00:00 via INTRAVENOUS
  Administered 2011-09-06: 100 mL/h via INTRAVENOUS
  Administered 2011-09-06: 09:00:00 via INTRAVENOUS

## 2011-09-06 MED ORDER — BELLADONNA ALKALOIDS-OPIUM 16.2-60 MG RE SUPP
RECTAL | Status: DC | PRN
  Administered 2011-09-06: 1 via RECTAL

## 2011-09-06 MED ORDER — HYDROCODONE-ACETAMINOPHEN 5-325 MG PO TABS
1.0000 | ORAL_TABLET | ORAL | Status: DC | PRN
  Administered 2011-09-06 – 2011-09-07 (×2): 2 via ORAL

## 2011-09-06 MED ORDER — IBUPROFEN 800 MG PO TABS: 800.0000 mg | ORAL_TABLET | Freq: Three times a day (TID) | ORAL | Status: DC | PRN

## 2011-09-06 MED ORDER — CIPROFLOXACIN HCL 250 MG PO TABS
250.0000 mg | ORAL_TABLET | Freq: Two times a day (BID) | ORAL | Status: DC
  Administered 2011-09-06: 250 mg via ORAL

## 2011-09-06 MED ORDER — METFORMIN HCL 500 MG PO TABS: 1000.0000 mg | ORAL_TABLET | Freq: Two times a day (BID) | ORAL | Status: DC

## 2011-09-06 MED ORDER — BUPIVACAINE-EPINEPHRINE 0.5% -1:200000 IJ SOLN
INTRAMUSCULAR | Status: DC | PRN
  Administered 2011-09-06: 20 mL

## 2011-09-06 SURGICAL SUPPLY — 51 items
BAG URINE DRAINAGE (UROLOGICAL SUPPLIES) ×3 IMPLANT
BLADE SURG 10 STRL SS (BLADE) ×3 IMPLANT
BLADE SURG 15 STRL LF DISP TIS (BLADE) ×2 IMPLANT
BLADE SURG 15 STRL SS (BLADE) ×1
BLADE SURG ROTATE 9660 (MISCELLANEOUS) ×3 IMPLANT
CANISTER SUCTION 1200CC (MISCELLANEOUS) ×6 IMPLANT
CANISTER SUCTION 2500CC (MISCELLANEOUS) IMPLANT
CATH FOLEY 2WAY SLVR  5CC 16FR (CATHETERS) ×1
CATH FOLEY 2WAY SLVR 5CC 16FR (CATHETERS) ×2 IMPLANT
CLOTH BEACON ORANGE TIMEOUT ST (SAFETY) ×3 IMPLANT
COVER MAYO STAND STRL (DRAPES) ×3 IMPLANT
COVER TABLE BACK 60X90 (DRAPES) ×3 IMPLANT
DERMABOND ADVANCED (GAUZE/BANDAGES/DRESSINGS) ×2
DERMABOND ADVANCED .7 DNX12 (GAUZE/BANDAGES/DRESSINGS) ×4 IMPLANT
DISSECTOR ROUND CHERRY 3/8 STR (MISCELLANEOUS) ×3 IMPLANT
DRAPE CAMERA CLOSED 9X96 (DRAPES) ×3 IMPLANT
DRAPE UNDERBUTTOCKS STRL (DRAPE) ×3 IMPLANT
ELECT REM PT RETURN 9FT ADLT (ELECTROSURGICAL) ×3
ELECTRODE REM PT RTRN 9FT ADLT (ELECTROSURGICAL) ×2 IMPLANT
GAUZE SPONGE 4X4 16PLY XRAY LF (GAUZE/BANDAGES/DRESSINGS) IMPLANT
GLOVE BIO SURGEON STRL SZ7.5 (GLOVE) ×3 IMPLANT
GLOVE ECLIPSE 6.5 STRL STRAW (GLOVE) ×3 IMPLANT
GLOVE INDICATOR 6.5 STRL GRN (GLOVE) ×6 IMPLANT
GOWN PREVENTION PLUS LG XLONG (DISPOSABLE) ×9 IMPLANT
GOWN STRL REIN XL XLG (GOWN DISPOSABLE) ×3 IMPLANT
NEEDLE HYPO 22GX1.5 SAFETY (NEEDLE) ×6 IMPLANT
PACK CYSTOSCOPY (CUSTOM PROCEDURE TRAY) ×3 IMPLANT
PACKING VAGINAL (PACKING) ×3 IMPLANT
PENCIL BUTTON HOLSTER BLD 10FT (ELECTRODE) ×3 IMPLANT
PLUG CATH AND CAP STER (CATHETERS) ×3 IMPLANT
RETRACTOR LONRSTAR 16.6X16.6CM (MISCELLANEOUS) ×4 IMPLANT
RETRACTOR STAY HOOK 5MM (MISCELLANEOUS) ×3 IMPLANT
RETRACTOR STER APS 16.6X16.6CM (MISCELLANEOUS) ×6
SET IRRIG Y TYPE TUR BLADDER L (SET/KITS/TRAYS/PACK) ×3 IMPLANT
SHEET LAVH (DRAPES) ×3 IMPLANT
SLING LYNX SUPRAPUBIC (Sling) ×3 IMPLANT
SLING SOLYX SYSTEM SIS BX (SLING) IMPLANT
SPONGE LAP 4X18 X RAY DECT (DISPOSABLE) ×6 IMPLANT
SUCTION FRAZIER TIP 10 FR DISP (SUCTIONS) ×3 IMPLANT
SUT ETHILON 2 0 PS N (SUTURE) IMPLANT
SUT MON AB 2-0 SH 27 (SUTURE)
SUT MON AB 2-0 SH27 (SUTURE) IMPLANT
SUT VIC AB 2-0 UR6 27 (SUTURE) ×3 IMPLANT
SYR BULB IRRIGATION 50ML (SYRINGE) ×3 IMPLANT
SYR CONTROL 10ML LL (SYRINGE) ×3 IMPLANT
SYRINGE 10CC LL (SYRINGE) ×3 IMPLANT
TRAY DSU PREP LF (CUSTOM PROCEDURE TRAY) ×3 IMPLANT
TUBE CONNECTING 12X1/4 (SUCTIONS) ×6 IMPLANT
WATER STERILE IRR 3000ML UROMA (IV SOLUTION) ×3 IMPLANT
WATER STERILE IRR 500ML POUR (IV SOLUTION) ×6 IMPLANT
YANKAUER SUCT BULB TIP NO VENT (SUCTIONS) IMPLANT

## 2011-09-06 NOTE — Transfer of Care (Signed)
Immediate Anesthesia Transfer of Care Note  Patient: Sandra [name]  Procedure(s) Performed: Procedure(s) (LRB): CYSTOSCOPY (N/A) PUBO-VAGINAL SLING (N/A)  Patient Location: PACU  Anesthesia Type: Spinal  Level of Consciousness: awake, alert  and oriented  Airway & Oxygen Therapy: Patient Spontanous Breathing and Patient connected to face mask oxygen  Post-op Assessment: Report given to PACU RN and Post -op Vital signs reviewed and stable  Post vital signs: Reviewed and stable  Complications: No apparent anesthesia complications

## 2011-09-06 NOTE — Progress Notes (Signed)
Received report as caregiver for lunch relief

## 2011-09-06 NOTE — Anesthesia Procedure Notes (Signed)
Spinal Patient location during procedure: OR Staffing Anesthesiologist: Tasia Liz Performed by: anesthesiologist  Preanesthetic Checklist Completed: patient identified, site marked, surgical consent, pre-op evaluation, timeout performed, IV checked, risks and benefits discussed and monitors and equipment checked Spinal Block Patient position: sitting Prep: Betadine Patient monitoring: heart rate, continuous pulse ox and blood pressure Approach: left paramedian Location: L3-4 Injection technique: single-shot Needle Needle type: Spinocan  Needle gauge: 22 G Needle length: 9 cm Additional Notes Expiration date of kit checked and confirmed. Patient tolerated procedure well, without complications.     

## 2011-09-06 NOTE — Anesthesia Preprocedure Evaluation (Signed)
Anesthesia Evaluation  Patient identified by MRN, date of birth, ID band Patient awake    Reviewed: Allergy & Precautions, H&P , NPO status , Patient's Chart, lab work & pertinent test results  History of Anesthesia Complications (+) DIFFICULT AIRWAY  Airway Mallampati: III TM Distance: >3 FB Neck ROM: Full    Dental No notable dental hx.    Pulmonary neg pulmonary ROS, sleep apnea ,  breath sounds clear to auscultation  Pulmonary exam normal       Cardiovascular hypertension, Pt. on medications + CAD (mild 0-25% LAD) negative cardio ROS  Rhythm:Regular Rate:Normal     Neuro/Psych negative neurological ROS  negative psych ROS   GI/Hepatic negative GI ROS, Neg liver ROS,   Endo/Other  negative endocrine ROSDiabetes mellitus-  Renal/GU negative Renal ROS  negative genitourinary   Musculoskeletal negative musculoskeletal ROS (+)   Abdominal   Peds negative pediatric ROS (+)  Hematology negative hematology ROS (+)   Anesthesia Other Findings   Reproductive/Obstetrics negative OB ROS                           Anesthesia Physical Anesthesia Plan  ASA: II  Anesthesia Plan: Spinal   Post-op Pain Management:    Induction:   Airway Management Planned:   Additional Equipment:   Intra-op Plan:   Post-operative Plan:   Informed Consent: I have reviewed the patients History and Physical, chart, labs and discussed the procedure including the risks, benefits and alternatives for the proposed anesthesia with the patient or authorized representative who has indicated his/her understanding and acceptance.   Dental advisory given  Plan Discussed with: CRNA  Anesthesia Plan Comments: (In light of h/o difficult intubation 2006 will do spinal today. Pt agrees.)        Anesthesia Quick Evaluation

## 2011-09-06 NOTE — Anesthesia Postprocedure Evaluation (Signed)
  Anesthesia Post-op Note  Patient: Sandra [name]  Procedure(s) Performed: Procedure(s) (LRB): CYSTOSCOPY (N/A) PUBO-VAGINAL SLING (N/A)  Patient Location: PACU  Anesthesia Type: Spinal  Level of Consciousness: awake and alert   Airway and Oxygen Therapy: Patient Spontanous Breathing  Post-op Pain: mild  Post-op Assessment: Post-op Vital signs reviewed, Patient's Cardiovascular Status Stable, Respiratory Function Stable, Patent Airway and No signs of Nausea or vomiting  Post-op Vital Signs: stable  Complications: No apparent anesthesia complications

## 2011-09-06 NOTE — Interval H&P Note (Signed)
History and Physical Interval Note:  09/06/2011 8:53 AM  Sandra [name]  has presented today for surgery, with the diagnosis of STRESS INCONTINENCE  The various methods of treatment have been discussed with the patient and family. After consideration of risks, benefits and other options for treatment, the patient has consented to  Procedure(s) (LRB): CYSTOSCOPY (N/A) PUBO-VAGINAL SLING (N/A) as a surgical intervention .  The patients' history has been reviewed, patient examined, no change in status, stable for surgery.  I have reviewed the patients' chart and labs.  Questions were answered to the patient's satisfaction.     Jethro Bolus I

## 2011-09-06 NOTE — Op Note (Signed)
Pre-operative diagnosis : Stress urinary incontinence  Postoperative diagnosis: Same  Operation: [place] pubovaginal sling  Surgeon:  S. Patsi Sears, MD  First assistant: None  Anesthesia:  spinal  Preparation: After appropriate preanesthesia, the patient was brought to the operating room, and placed upon the operating table in the left lateral decubitus position where spinal anesthetic was introduced. She was then replaced in the dorsal lithotomy position where the pubis was prepped with Betadine solution and draped in usual fashion. The arm band was rechecked.  Review history: The patient is a 51 year old female diabetic, with mixed stress and urge incontinence. She is urodynamic evaluation showing a leak point pressure of 34 cm water, but also with an unstable bladder with uninhibited contractions. She is a hyper mobile urethra, and a positive Marshall test and a positive Q-tip test. After extensive counseling, it has been decided to proceed with a [place] pubovaginal sling. She will probably need anticholinergic medication as well. She may also need physical therapy postoperatively.  Statement of  Likelihood of Success: Excellent. TIME-OUT observed.:  Procedure: Following spinal anesthetic (history of difficult intubation), and following prepping and draping and timeout, Foley catheter was placed, and pelvic examination accomplished. Urethra hypermobile, but there was no pelvic prolapse noted. The bladder neck was marked with a marking pen, and the urethra was outlined with a marking pen, noting the middle third of the urethra. The pubis was identified, and the area of incision 2 fingerbreadths above the pubis and lateral to the pubis bilaterally was marked with a marking pen. Using Marcaine with epinephrine, 0.25 with 1-200,000 epinephrine, local anesthetic was injected in all areas of incision.  Mid urethral incision was accomplished measuring 2 cm. Subcutaneous tissue was dissected on either side  of the urethra, to the level of the pubic ramus. Bilateral pubic incisions were then made, and the [employer] Rx needles were placed retropubically, and brought through the periurethral incisions. Cystoscopy was accomplished with both the 30 and 70 lenses, and there was no evidence of any needles within the bladder or urethra. Excellent motion of the bladder neck was accomplished. The bladder is drained of fluid and the Foley catheter replaced. The [place] sling was placed in standard fashion, and with a large right angle clamp in the midline and used for tensioning, the [place] sling was placed. The plastic cap was cut in the midline, and removed. The right angle clamp remained in position for tensioning. Each side was then cut subcutaneously. Again, evaluation of the midline showed that the sling was in loose apposition to the urethra. The midline wound was closed with 2-0 Vicryl suture in a running fashion. Minimal bleeding was noted. The suprapubic incisions were closed with Dermabond. The patient received IV Tylenol and the operating room. She also received a B. and O. suppository. She was taken to recovery room in excellent condition.

## 2011-09-06 NOTE — H&P (Signed)
Active Problems Problems  1. Urge And Stress Incontinence 788.33  History of Present Illness    This patient is a 51 year old diabetic female returns today for cystoscopy & to review urodynamic results.  Hx of stress urinary incontinence, bladder pressure, urgency, frequency, and nocturia. 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 She has occasional cough incontinence. She has had urodynamics in the past, showing a Valsalva leak point pressure of 34, with a repeat of 72 and a capacity of 660 cc.  Urodynamics shows a first sensation at 119 cc strong desire at 119 cc with an unstable bladder. She has a maximum unstable bladder contraction pressure of 65 cm water severe urinary leakage. Patient was evaluated for leak point pressure, but she did not leak with a maximum abdominal pressure generation of 117 cm of water. Pressure flow study shows that the patient has a maximal flow  rate of 12 cc per second, with detrusor pressure at maximum flow was 51 cm shorter.   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 She is positive for cough and Valsalva induced instability. There was some leakage during these contractions. She did not leak for stress incontinence evaluation, however. She was able to void voluntarily. There is mild trabeculation seen on VCUG, and possible diverticulum on the dome of the bladder.   Past Medical History Problems  1. History of  Adult Sleep Apnea 780.57 2. History of  Anxiety (Symptom) 300.00 3. History of  Diabetes Mellitus 250.00 4. 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 History of  Hypertension 401.9 6. History of  Murmurs 785.2  Surgical History Problems  1. History of  Breast Surgery Reduction Procedure Bilateral 2. History of  Hysterectomy V45.77 3. History of  Tonsillectomy  Current Meds 1. Benazepril HCl 20 MG Oral Tablet; Therapy: (Recorded:20Apr2012) to 2. Calcium 600 +  D TABS; Therapy: (Recorded:20Apr2012) to 3. Fish Oil CAPS; Therapy: (Recorded:20Apr2012) to 4. Januvia 100 MG Oral Tablet; Therapy: (Recorded:20Apr2012) to 5. MetFORMIN HCl 1000 MG Oral Tablet; Therapy: (Recorded:20Apr2012) to  Allergies Medication  1. No Known Drug Allergies  Family History Problems  1. Family history of  Family Health Status Number Of Children 1 son, 1 daughter 2. Family history of  FH Unobtainable - Patient Adopted  Social History Problems  1. Caffeine Use 1-2 per day 2. Marital History - Separated 3. Never A Smoker 4. Occupation: Endo Scientist, water quality  5. History of  Alcohol Use  Review of Systems Genitourinary, constitutional, skin, eye, otolaryngeal, hematologic/lymphatic, cardiovascular, pulmonary, endocrine, musculoskeletal, gastrointestinal, neurological and psychiatric system(s) were reviewed and pertinent findings if present are noted.  Genitourinary: urinary frequency, incontinence and incomplete emptying of bladder, but no urinary urgency, no dysuria, no nocturia, urinary stream does not start and stop and no hematuria.    Vitals Vital Signs [Data Includes: Last 1 Day]  05Dec2012 11:23AM  Blood Pressure: 169 / 104 Temperature: 98.4 F Heart Rate: 61  Results/Data Urine [Data Includes: Last 1 Day]   05Dec2012  COLOR YELLOW   APPEARANCE CLEAR   SPECIFIC GRAVITY 1.020   pH 5.0   GLUCOSE NEG mg/dL  BILIRUBIN NEG   KETONE NEG mg/dL  BLOOD TRACE   PROTEIN NEG mg/dL  UROBILINOGEN 0.2 mg/dL  NITRITE NEG   LEUKOCYTE ESTERASE NEG   SQUAMOUS EPITHELIAL/HPF FEW   WBC 0-3 WBC/hpf  RBC NONE SEEN RBC/hpf  BACTERIA RARE   CRYSTALS NONE SEEN   CASTS NONE SEEN  Procedure  Marshall++, Q-tip +.   Procedure: Cystoscopy  Indication: Lower Urinary Tract Symptoms.  Informed Consent: Risks, benefits, and potential adverse events were discussed and informed consent was obtained from the patient.  Prep: The patient was prepped with betadine.    Anesthesia:. Local anesthesia was administered intraurethrally with 2% lidocaine jelly.  Antibiotic prophylaxis: Ciprofloxacin.  Procedure Note:  Urethral meatus:. No abnormalities.  Anterior urethra: No abnormalities.  Prostatic urethra: No abnormalities.  Bladder: Visulization was clear. The ureteral orifices were in the normal anatomic position bilaterally. A systematic survey of the bladder demonstrated no bladder tumors or stones. no diverticulum seen The patient tolerated the procedure well.  Complications: None.    Assessment Assessed  1. Urge And Stress Incontinence 788.33   Pt has stress incontinence. She is a diabetic, with + Marshall test and urethral hypermobility. She will have a [place] sling.   Plan Health Maintenance (V70.0)     [place]x sling. 23 hr obs. Out of work 3 weeks.   Signatures

## 2011-09-07 NOTE — Discharge Instructions (Signed)
Urethral Vaginal Sling A urethral vaginal sling can be done to correct urinary incontinence (UI). Urinary incontinence is uncontrolled loss of urine. It is very common in both women who have had children and in aging women. The purpose of the "urethral vaginal sling" is to place a strong piece of material (i.e., tension-free vaginal tape or nylon mesh) that fits under the urethra like a hammock. The urethra is the tube that drains your bladder. This sling is put in position to straighten, support and hold the urethra in its normal position. This is a common surgical procedure for this problem. LET YOUR CAREGIVER KNOW ABOUT:   Any allergies to foods or medications.   All the medications you are taking. This includes over-the-counter drugs, herbs, eye drops, creams and steroids.   Use of illegal drugs.   Smoking or heavy alcohol drinking.   Past problems with anesthetics.   Possibility of being pregnant.   History of blood clots or other blood problems.   History of bleeding problems.   Past surgery.   Other medical or health problems.  RISKS AND COMPLICATIONS   Infection.   Excessive bleeding.   Damage to other organs.   Problems urinating properly for several days or weeks.   Problems from the anesthesia.   The UI can come back.  BEFORE THE PROCEDURE   Do not take aspirin or blood thinners a week before the surgery unless you are told otherwise.   Do not eat or drink anything 8 hours before the surgery.   If you smoke, do not smoke at least two weeks before the surgery.   Let your caregiver know if you get a cold or infection before your surgery.   If you will be admitted the same day as the surgery, get to the hospital at least one hour before the surgery.   Arrange for someone to take you home from the hospital and for help at home.  AFTER THE PROCEDURE   You will be taken to recovery area where nurses will monitor your progress and vital signs (blood pressure,  pulse, breathing, temperature). They will move you to your room when you are stable.   You will have a catheter in your bladder until you can urinate properly.   You may have a gauze packing in the vagina to prevent bleeding. This will be removed in one to two days.   You are usually in the hospital 2 to 3 days.  HOME CARE INSTRUCTIONS   Take showers instead of baths until you are informed otherwise.   Resume your usual diet.   Get rest and sleep.   Try to have someone at home for 2 to 3 weeks to help you with your household activities.   Do not lift anything over 5 pounds.   Only take over-the-counter or prescription medicines for pain, discomfort or fever as directed by your caregiver. Do not take aspirin because it can cause bleeding.   Do not douche, exercise, use tampons or have sexual intercourse until your caregiver gives you permission.  SEEK MEDICAL CARE IF:   You have a heavy or bad smelling vaginal discharge.   You develop a rash.   You need stronger pain medication.   You are having side effects from your medications.   You develop lightheadedness or feel faint.  SEEK IMMEDIATE MEDICAL CARE IF:   You develop a temperature of 100 F (37.8 C) or higher.   You have vaginal bleeding.   You faint   or pass out.   You develop shortness of breath.   You develop chest, abdominal or leg pain.   You develop pain when urinating or cannot urinate.   Your catheter is still in your bladder and it blocks up.   You develop swelling, redness and pain in the vaginal area.  Document Released: 01/23/2008 Document Revised: 04/04/2011 Document Reviewed: 01/23/2008 ExitCare Patient Information 2012 [employer]. 

## 2011-09-09 ENCOUNTER — Encounter (HOSPITAL_BASED_OUTPATIENT_CLINIC_OR_DEPARTMENT_OTHER): Payer: Self-pay | Admitting: Urology

## 2011-09-30 ENCOUNTER — Encounter: Payer: 59 | Admitting: Internal Medicine

## 2012-09-01 ENCOUNTER — Ambulatory Visit (INDEPENDENT_AMBULATORY_CARE_PROVIDER_SITE_OTHER): Payer: 59 | Admitting: Gynecology

## 2012-09-01 ENCOUNTER — Encounter: Payer: Self-pay | Admitting: Gynecology

## 2012-09-01 ENCOUNTER — Other Ambulatory Visit (HOSPITAL_COMMUNITY)
Admission: RE | Admit: 2012-09-01 | Discharge: 2012-09-01 | Disposition: A | Discharge status: Home / Self Care | Payer: 59 | Source: Ambulatory Visit | Attending: Gynecology | Admitting: Gynecology

## 2012-09-01 VITALS — BP 140/80 | Ht 62.25 in | Wt 179.0 lb

## 2012-09-01 DIAGNOSIS — Z1151 Encounter for screening for human papillomavirus (HPV): Secondary | ICD-10-CM | POA: Insufficient documentation

## 2012-09-01 DIAGNOSIS — Z01419 Encounter for gynecological examination (general) (routine) without abnormal findings: Secondary | ICD-10-CM | POA: Insufficient documentation

## 2012-09-01 DIAGNOSIS — Z8542 Personal history of malignant neoplasm of other parts of uterus: Secondary | ICD-10-CM

## 2012-09-01 DIAGNOSIS — Z23 Encounter for immunization: Secondary | ICD-10-CM

## 2012-09-01 LAB — CA 125: CA 125: 5.3 U/mL (ref 0.0–30.2)

## 2012-09-01 NOTE — Patient Instructions (Addendum)

## 2012-09-01 NOTE — Progress Notes (Signed)
Sandra [name] October 05, 1960 [id number]   History:    52 y.o.  for annual gyn exam with no complaints today.the patient in 2006 had a total abdominal hysterectomy as a result of menorrhagia and a preoperative endometrial biopsy which had demonstrated complex hyperplasia without atypia. 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 No invasion was identified. The cervix was negative for carcinoma and she had leiomyomatous uteri as well. The case had been discussed with the GYN oncologist (Dr. Emmaline Kluver) at the [facility]) and had recommended no further intervention at that time and she had been followed closely every 6 months and has been cancer free since then. She is getting annual CA 125 along with a pelvic ultrasound.patient would know prior history of abnormal Pap smears. Her mammogram was in 2013 she has one scheduled the next few weeks. Patient has past history of benign colon polyp back in 2010. Her primary physician Dr. Doristine Counter has been doing her lab work and has been monitoring her hypercholesterolemia, hypertension, and type 2 diabetes. Patient does not recall ever having received a Tdap vaccine  Past medical history,surgical history, family history and social history were all reviewed and documented in the EPIC chart.  Gynecologic History No LMP recorded. Patient has had a hysterectomy. Contraception: status post hysterectomy Last Pap: 2013. Results were: normal Last mammogram: 2013. Results were: normal  Obstetric History OB History   Grav Para Term Preterm Abortions TAB SAB Ect Mult Living   3 2   1  1   2      # Outc Date GA Lbr Len/2nd Wgt Sex Del Anes PTL Lv   1 PAR            2 PAR            3 SAB                ROS: A ROS was performed and pertinent positives and negatives are included in the history.  GENERAL:  No fevers or chills. HEENT: No change in vision, no earache, sore throat or sinus congestion. NECK: No pain or stiffness. CARDIOVASCULAR: No chest pain or pressure. No palpitations. PULMONARY: No shortness of breath, cough or wheeze. GASTROINTESTINAL: No abdominal pain, nausea, vomiting or diarrhea, melena or bright red blood per rectum. GENITOURINARY: No urinary frequency, urgency, hesitancy or dysuria. MUSCULOSKELETAL: No joint or muscle pain, no back pain, no recent trauma. DERMATOLOGIC: No rash, no itching, no lesions. ENDOCRINE: No polyuria, polydipsia, no heat or cold intolerance. No recent change in weight. HEMATOLOGICAL: No anemia or easy bruising or bleeding. NEUROLOGIC: No headache, seizures, numbness, tingling or weakness. PSYCHIATRIC: No depression, no loss of interest in normal activity or change in sleep pattern.     Exam: chaperone present  BP 140/80  Ht 5' 2.25" (1.581 m)  Wt 179 lb (81.194 kg)  BMI 32.48 kg/m2  Body mass index is 32.48 kg/(m^2).  General appearance : Well developed well nourished female. No acute distress HEENT: Neck supple, trachea midline, no carotid bruits, no thyroidmegaly Lungs: Clear to auscultation, no rhonchi or wheezes, or rib retractions  Heart: Regular rate and rhythm, no murmurs or gallops Breast:Examined in sitting and supine position were symmetrical in appearance, no palpable masses or tenderness,  no skin retraction, no nipple inversion, no nipple discharge, no skin discoloration, no axillary or supraclavicular lymphadenopathy Abdomen: no palpable masses  or tenderness, no rebound or guarding Extremities: no edema or skin discoloration or tenderness  Pelvic:  Bartholin, Urethra, Skene Glands: Within normal limits             Vagina: No gross lesions or discharge  Cervix:absent  Uterus Absent  Adnexa  Without masses or tenderness  Anus and perineum  normal   Rectovaginal  normal sphincter tone without palpated masses or tenderness              Hemoccult Cards provided     Assessment/Plan:  52 y.o. 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 No invasion was identified in 2006 and is doing well with no evidence of recurrent disease. Patient with very minimal vasomotor symptoms. She will be scheduled to have a C1 25 today along with scheduling an ultrasound for annual close followup of her ovaries. Hemoccult cards were provided for her to symmetrical the office for testing Pap smear was done today. Blood work will be drawn by her primary physician. Patient did receive a Tdap vaccine today.    Ok Edwards MD, 11:18 AM 09/01/2012

## 2012-09-02 ENCOUNTER — Encounter: Payer: Self-pay | Admitting: Gynecology

## 2012-09-11 ENCOUNTER — Encounter: Payer: Self-pay | Admitting: Gynecology

## 2012-09-18 ENCOUNTER — Ambulatory Visit (INDEPENDENT_AMBULATORY_CARE_PROVIDER_SITE_OTHER): Payer: 59

## 2012-09-18 ENCOUNTER — Ambulatory Visit (INDEPENDENT_AMBULATORY_CARE_PROVIDER_SITE_OTHER): Payer: 59 | Admitting: Gynecology

## 2012-09-18 ENCOUNTER — Encounter: Payer: Self-pay | Admitting: Gynecology

## 2012-09-18 DIAGNOSIS — Z86010 Personal history of colonic polyps: Secondary | ICD-10-CM

## 2012-09-18 DIAGNOSIS — Z8542 Personal history of malignant neoplasm of other parts of uterus: Secondary | ICD-10-CM

## 2012-09-18 NOTE — Progress Notes (Signed)
52 y.o. 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 Patient presented to the office today for followup ultrasound . Ultrasound report as follows:  Ovaries appeared to be normal. No free fluid seen.  CA one 25 within normal limits 5.3 period the patient had colon polyps in 2010. She submitted to the office in nuchal cord for testing results pending. Recent Pap smear was normal as well. Patient returned back to the office in 1 year or when necessary.

## 2012-09-24 ENCOUNTER — Other Ambulatory Visit: Payer: Self-pay | Admitting: Anesthesiology

## 2012-09-24 DIAGNOSIS — Z1211 Encounter for screening for malignant neoplasm of colon: Secondary | ICD-10-CM

## 2012-12-31 ENCOUNTER — Emergency Department
Admission: EM | Admit: 2012-12-31 | Discharge: 2012-12-31 | Disposition: A | Discharge status: Home / Self Care | Payer: 59 | Source: Home / Self Care | Attending: Family Medicine | Admitting: Family Medicine

## 2012-12-31 ENCOUNTER — Emergency Department (INDEPENDENT_AMBULATORY_CARE_PROVIDER_SITE_OTHER): Payer: 59

## 2012-12-31 ENCOUNTER — Encounter: Payer: Self-pay | Admitting: *Deleted

## 2012-12-31 DIAGNOSIS — Z8701 Personal history of pneumonia (recurrent): Secondary | ICD-10-CM

## 2012-12-31 DIAGNOSIS — J069 Acute upper respiratory infection, unspecified: Secondary | ICD-10-CM

## 2012-12-31 DIAGNOSIS — R05 Cough: Secondary | ICD-10-CM

## 2012-12-31 DIAGNOSIS — R059 Cough, unspecified: Secondary | ICD-10-CM

## 2012-12-31 DIAGNOSIS — R0989 Other specified symptoms and signs involving the circulatory and respiratory systems: Secondary | ICD-10-CM

## 2012-12-31 MED ORDER — AZITHROMYCIN 250 MG PO TABS: ORAL_TABLET | ORAL | Status: DC

## 2012-12-31 MED ORDER — BENZONATATE 200 MG PO CAPS: 200.0000 mg | ORAL_CAPSULE | Freq: Every day | ORAL | Status: DC

## 2012-12-31 NOTE — ED Notes (Signed)
Sandra [name] c/o productive cough, fever and body aches x 3 days.

## 2012-12-31 NOTE — ED Provider Notes (Signed)
CSN: [id number]     Arrival date & time 12/31/12  4098 History   None    Chief Complaint  Patient presents with  . Cough  . Generalized Body Aches     HPI Comments: Patient complains of onset of URI symptoms 6 days ago with scratchy throat, sinus congestion, myalgias, and fatigue.  Three days ago she developed a non-productive cough and increased myalgias with chills/sweats.  She has occasional wheezing and shortness of breath. She has had pneumonia several times in the past.  The history is provided by the patient.    Past Medical History  Diagnosis Date  . Hyperlipidemia   . Diabetes mellitus   . Hypertension   . Adenomatous polyp 2011    BENIGN  . OSA on CPAP   . History of endometrial cancer 2006 ---- S/P ABD. HYSTERECTOMY    STAGE I ENDOMETROID CARCINOMA  . CAD (coronary artery disease) NON-OBSTRUCTIVE PER DR HOCHREIN NOTE 08-26-2011  . Complication of anesthesia PT STATES HAS LETTER FROM [facility]. STATING THAT SHE HAS A VERY SMALL AIRWAY (LAST SURG. HYST[facility]CTOMY AT [facility] IN 2006    PT STATES LYNN BEASON CRNA WHOM WORKS HERE TOLD SHE WOULD BE OKAY TO BE DONE AT [facility]   Past Surgical History  Procedure Laterality Date  . Cesarean section  1989  . Abdominal hysterectomy  12-20-2004    TAH  . Bilateral breast reduction  11-16-1999  . Uvulopalatopharyngoplasty, tonsillectomy and septoplasty  1994  . Cystoscopy  09/06/2011    Procedure: CYSTOSCOPY;  Surgeon: Kathi Ludwig, MD;  Location: [facility];  Service: Urology;  Laterality: N/A;  LYNX SLING OWER SLEEP APNEA USES CPAP  . Pubovaginal sling  09/06/2011    Procedure: Leonides Grills;  Surgeon: Kathi Ludwig, MD;  Location: [facility];  Service: Urology;  Laterality: N/A;   Family History  Problem Relation Age of Onset  . Adopted: Yes   History  Substance Use Topics  . Smoking status: Never Smoker   . Smokeless tobacco: Never Used  . Alcohol Use: No   OB History   Grav Para Term Preterm Abortions TAB SAB Ect Mult Living   3 2   1  1   2      Review of Systems + sore throat, resolved + cough No pleuritic pain + wheezing + nasal congestion + post-nasal drainage No sinus pain/pressure No itchy/red eyes ? earache No hemoptysis + SOB with activity No fever, + chills + nausea Occasional vomiting with cough No abdominal pain No diarrhea No urinary symptoms No skin rashes + fatigue + myalgias + headache Used OTC meds without relief  Allergies  Review of patient's allergies indicates no known allergies.  Home Medications   Current Outpatient Rx  Name  Route  Sig  Dispense  Refill  . azithromycin (ZITHROMAX Z-PAK) 250 MG tablet      Take 2 tabs today; then begin one tab once daily for 4 more days. (Rx void after 01/09/13)   6 each   0   . benazepril (LOTENSIN) 40 MG tablet   Oral   Take 40 mg by mouth every evening.         . benzonatate (TESSALON) 200 MG capsule   Oral   Take 1 capsule (200 mg total) by mouth at bedtime. Take as needed for cough   12 capsule   0   . ibuprofen (ADVIL,MOTRIN) 800 MG tablet   Oral   Take 800 mg  by mouth every 8 (eight) hours as needed. For pain          . metFORMIN (GLUCOPHAGE) 1000 MG tablet   Oral   Take 1,000 mg by mouth 2 (two) times daily with a meal.         . Omega-3 Fatty Acids (FISH OIL) 1000 MG CAPS   Oral   Take 2 capsules by mouth 2 (two) times daily.         Marland Kitchen EXPIRED: oxybutynin (DITROPAN XL) 5 MG 24 hr tablet   Oral   Take 1 tablet (5 mg total) by mouth daily.   14 tablet   1   . sitaGLIPtin (JANUVIA) 100 MG tablet   Oral   Take 100 mg by mouth daily.           BP 196/85  Pulse 85  Temp(Src) 98.4 F (36.9 C) (Oral)  Resp 16  Ht 5\' 2"  (1.575 m)  Wt 181 lb (82.101 kg)  BMI 33.1 kg/m2  SpO2 98% Physical Exam Nursing notes and Vital Signs reviewed. Appearance:  Patient appears stated age, and in no acute distress.  Patient is obese (BMI 33.1) Eyes:   Pupils are equal, round, and reactive to light and accomodation.  Extraocular movement is intact.  Conjunctivae are not inflamed  Ears:  Canals normal.  Tympanic membranes normal.  Nose:  Mildly congested turbinates.  No sinus tenderness.   Pharynx:  Normal Neck:  Supple.  Tender, slightly enlarged posterior nodes are palpated bilaterally  Lungs:   Faint rhonchi and wheezes right posterior base.  Breath sounds are equal.  Chest:  Distinct tenderness to palpation over the mid-sternum.  Heart:  Regular rate and rhythm without murmurs, rubs, or gallops.  Abdomen:  Nontender without masses or hepatosplenomegaly.  Bowel sounds are present.  No CVA or flank tenderness.  Extremities:  No edema.  No calf tenderness Skin:  No rash present.   ED Course  Procedures  none    Imaging Review Dg Chest 2 View  12/31/2012   *RADIOLOGY REPORT*  Clinical Data: Cough and congestion  CHEST - 2 VIEW  Comparison: 08/15/2011  Findings: The cardiac shadow is within normal limits.  The lungs are clear bilaterally.  No acute bony abnormality is seen.  IMPRESSION: No acute abnormality noted.   Original Report Authenticated By: Alcide Clever, M.D.    MDM   1. Acute upper respiratory infections of unspecified site; suspect viral URI   There is no evidence of bacterial infection today.   Treat symptomatically for now: Prescription written for Benzonatate [facility]) to take at bedtime for night-time cough.  Take plain Mucinex 1200mg  (guaifenesin) twice daily for cough and congestion.  Increase fluid intake, rest. May use Afrin nasal spray (or generic oxymetazoline) twice daily for about 5 days.  Also recommend using saline nasal spray several times daily and saline nasal irrigation (AYR is a common brand) Stop all antihistamines for now, and other non-prescription cough/cold preparations. Begin Azithromycin if not improving about one week or if persistent fever develops (Given a prescription to hold, with an expiration  date)  Follow-up with family doctor if not improving about10 days.  With her past history of pneumonia, recommend a pneumococcal vaccination (Prevnar) when well    Lattie Haw, MD 12/31/12 1018

## 2013-02-27 HISTORY — PX: HEMORRHOID BANDING: SHX5850

## 2013-03-01 ENCOUNTER — Encounter: Payer: Self-pay | Admitting: Internal Medicine

## 2013-03-01 ENCOUNTER — Other Ambulatory Visit: Payer: Self-pay | Admitting: *Deleted

## 2013-03-01 MED ORDER — HYDROCORTISONE ACETATE 25 MG RE SUPP: RECTAL | Status: DC

## 2013-03-05 ENCOUNTER — Ambulatory Visit (INDEPENDENT_AMBULATORY_CARE_PROVIDER_SITE_OTHER): Payer: 59 | Admitting: Internal Medicine

## 2013-03-05 ENCOUNTER — Encounter: Payer: Self-pay | Admitting: Internal Medicine

## 2013-03-05 VITALS — BP 140/80 | HR 76 | Ht 62.0 in | Wt 187.0 lb

## 2013-03-05 DIAGNOSIS — K5900 Constipation, unspecified: Secondary | ICD-10-CM

## 2013-03-05 DIAGNOSIS — K648 Other hemorrhoids: Secondary | ICD-10-CM

## 2013-03-05 DIAGNOSIS — Z86010 Personal history of colonic polyps: Secondary | ICD-10-CM

## 2013-03-05 DIAGNOSIS — K5909 Other constipation: Secondary | ICD-10-CM

## 2013-03-05 HISTORY — DX: Other hemorrhoids: K64.8

## 2013-03-05 MED ORDER — LINACLOTIDE 145 MCG PO CAPS: 145.0000 ug | ORAL_CAPSULE | Freq: Every day | ORAL | Status: DC

## 2013-03-05 NOTE — Progress Notes (Signed)
Subjective:    Patient ID: Sandra [name], female    DOB: 01-May-1960, 52 y.o.   MRN: [id number]  HPI Patient is here, self referred because of rectal bleeding. Over the holiday weekend, she was out walking quite a bit and she felt a burning warm sensation in the rectal area, it intensified, and then she noticed later on that night that she had leaked bright red blood in her underwear pants. She has had problems with prolapsing hemorrhoids, with swelling in protrusion which spontaneously reduce his off and on in the last year. She suffers from chronic constipation. She had a hysterectomy for uterine cancer in the past. I called in and Anusol-HC suppository several days ago she used one she hasn't had any bleeding since but she had bleeding there was mostly painless for a few days after the hollow mean episode.  She can go 7 days or so without defecation. She is drinking a cup of prune juice every day as she doesn't like that that seems to help. In the past she has had pus minus results with MiraLax but she has a hard time remembering to take it. She bloats and distends, while she goes without defecation. The straining to stool definitely aggravates her hemorrhoids, preparation H. over-the-counter cream does seem to help some. Other problems include occasional fecal soiling.  GI review of systems otherwise negative.  No Known Allergies Outpatient Prescriptions Prior to Visit  Medication Sig Dispense Refill  . benazepril (LOTENSIN) 40 MG tablet Take 40 mg by mouth every evening.      . hydrocortisone (ANUSOL-HC) 25 MG suppository Use 1 suppository tonight, 03-01-13, then use as needed.  12 suppository  1  . ibuprofen (ADVIL,MOTRIN) 800 MG tablet Take 800 mg by mouth every 8 (eight) hours as needed. For pain       . metFORMIN (GLUCOPHAGE) 1000 MG tablet Take 1,000 mg by mouth 2 (two) times daily with a meal.      . Omega-3 Fatty Acids (FISH OIL) 1000 MG CAPS Take 2 capsules by mouth 2 (two) times  daily.      . sitaGLIPtin (JANUVIA) 100 MG tablet Take 100 mg by mouth daily.       Marland Kitchen azithromycin (ZITHROMAX Z-PAK) 250 MG tablet Take 2 tabs today; then begin one tab once daily for 4 more days. (Rx void after 01/09/13)  6 each  0  . benzonatate (TESSALON) 200 MG capsule Take 1 capsule (200 mg total) by mouth at bedtime. Take as needed for cough  12 capsule  0  . oxybutynin (DITROPAN XL) 5 MG 24 hr tablet Take 1 tablet (5 mg total) by mouth daily.  14 tablet  1   No facility-administered medications prior to visit.   Past Medical History  Diagnosis Date  . Hyperlipidemia   . Diabetes mellitus   . Hypertension   . Adenomatous polyp 2011    BENIGN  . OSA on CPAP   . History of endometrial cancer 2006 ---- S/P ABD. HYSTERECTOMY    STAGE I ENDOMETROID CARCINOMA  . CAD (coronary artery disease) NON-OBSTRUCTIVE PER DR HOCHREIN NOTE 08-26-2011  . Complication of anesthesia PT STATES HAS LETTER FROM [place] [facility]. STATING THAT SHE HAS A VERY SMALL AIRWAY (LAST SURG. HYSTERECTOMY AT [facility] IN 2006    PT STATES LYNN BEASON CRNA WHOM WORKS HERE TOLD SHE WOULD BE OKAY TO BE DONE AT [facility]   Past Surgical History  Procedure Laterality Date  . Cesarean section  1989  .  Abdominal hysterectomy  12-20-2004    TAH  . Bilateral breast reduction  11-16-1999  . Uvulopalatopharyngoplasty, tonsillectomy and septoplasty  1994  . Cystoscopy  09/06/2011    Procedure: CYSTOSCOPY;  Surgeon: Kathi Ludwig, MD;  Location: [facility];  Service: Urology;  Laterality: N/A;  LYNX SLING OWER SLEEP APNEA USES CPAP  . Pubovaginal sling  09/06/2011    Procedure: Leonides Grills;  Surgeon: Kathi Ludwig, MD;  Location: [facility];  Service: Urology;  Laterality: N/A;   History   Social History  . Marital Status: Married    Spouse Name: N/A    Number of Children: N/A  . Years of Education: N/A   Social History Main Topics  . Smoking status: Never Smoker   . Smokeless  tobacco: Never Used  . Alcohol Use: No  . Drug Use: No  . Sexual Activity: Yes    Birth Control/ Protection: Surgical     Comment: HYST    Social History Narrative   Married, second time, has children and cares for granddaughter   Endoscopy Tech at [employer]   Family History  Problem Relation Age of Onset  . Adopted: Yes       Review of Systems This is positive for recent URI, she had a kidney stone problem recently. All other review of systems negative or as per history of present illness.    Objective:   Physical Exam General:  NAD Abdomen:  soft and nontender Rectal:  With female staff present, there are right posterior and left lateral small anal tags. Otherwise anoderm is normal. Normal resting tone, no mass. No significant rectocele.  Anoscopy is performed, this demonstrates grade 1-2 internal hemorrhoids in all positions.  PROCEDURE NOTE: The patient presents with symptomatic grade 2  hemorrhoids, requesting rubber band ligation of his/her hemorrhoidal disease.  All risks, benefits and alternative forms of therapy were described and informed consent was obtained.  The decision was made to band the RP internal hemorrhoid, and the [facility] O'Regan System was used to perform band ligation without complication.  Digital anorectal examination was then performed to assure proper positioning of the band, and to adjust the banded tissue as required.  The patient was discharged home without pain or other issues.  Dietary and behavioral recommendations were given and along with follow-up instructions.     The following adjunctive treatments were recommended:  Linzess 145 mcg daily for chronic constipation  The patient will return in 2-3 WEEKS for  follow-up and possible additional banding as required. No complications were encountered and the patient tolerated the procedure well.     Assessment & Plan:   1. Hemorrhoids, internal, with bleeding and Grade 2  prolapse   2. Chronic constipation   3. Personal history of colonic polyps

## 2013-03-05 NOTE — Assessment & Plan Note (Addendum)
ROI for colonoscopy and pathology reports to determine when she needs another routine colonoscopy Diminutive SSA and 2 hyperplastics 12/29/09

## 2013-03-05 NOTE — Assessment & Plan Note (Signed)
RP columns banded today she will return in 2-3 weeks

## 2013-03-05 NOTE — Patient Instructions (Addendum)
HEMORRHOID BANDING PROCEDURE    FOLLOW-UP CARE   1. The procedure you have had should have been relatively painless since the banding of the area involved does not have nerve endings and there is no pain sensation.  The rubber band cuts off the blood supply to the hemorrhoid and the band may fall off as soon as 48 hours after the banding (the band may occasionally be seen in the toilet bowl following a bowel movement). You may notice a temporary feeling of fullness in the rectum which should respond adequately to plain Tylenol or Motrin.  2. Following the banding, avoid strenuous exercise that evening and resume full activity the next day.  A sitz bath (soaking in a warm tub) or bidet is soothing, and can be useful for cleansing the area after bowel movements.     3. To avoid constipation, take two tablespoons of natural wheat bran, natural oat bran, flax, Benefiber or any over the counter fiber supplement and increase your water intake to 7-8 glasses daily.    4. Unless you have been prescribed anorectal medication, do not put anything inside your rectum for two weeks: No suppositories, enemas, fingers, etc.  5. Occasionally, you may have more bleeding than usual after the banding procedure.  This is often from the untreated hemorrhoids rather than the treated one.  Don't be concerned if there is a tablespoon or so of blood.  If there is more blood than this, lie flat with your bottom higher than your head and apply an ice pack to the area. If the bleeding does not stop within a half an hour or if you feel faint, call our office at [phone] or go to the emergency room.  6. Problems are not common; however, if there is a substantial amount of bleeding, severe pain, chills, fever or difficulty passing urine (very rare) or other problems, you should call us at [phone] or report to the nearest emergency room.  7. Do not stay seated continuously for more than 2-3 hours for a day or two  after the procedure.  Tighten your buttock muscles 10-15 times every two hours and take 10-15 deep breaths every 1-2 hours.  Do not spend more than a few minutes on the toilet if you cannot empty your bowel; instead re-visit the toilet at a later time.    Today you have been given samples of Linzess , take one a day on an empty stomach.  Follow up with [place] in November 20th at 2:15pm.  We will get your records from [facility] for Dr Leone Payor to review.  I appreciate the opportunity to care for you.

## 2013-03-06 ENCOUNTER — Encounter: Payer: Self-pay | Admitting: Internal Medicine

## 2013-03-06 NOTE — Assessment & Plan Note (Signed)
Sounds like slow transit. Possibly some outlet issues. Trial of Linzess 145 ug daily.

## 2013-03-18 ENCOUNTER — Ambulatory Visit (INDEPENDENT_AMBULATORY_CARE_PROVIDER_SITE_OTHER): Payer: 59 | Admitting: Internal Medicine

## 2013-03-18 ENCOUNTER — Encounter: Payer: Self-pay | Admitting: Internal Medicine

## 2013-03-18 VITALS — BP 132/74 | HR 88 | Ht 62.0 in | Wt 192.0 lb

## 2013-03-18 DIAGNOSIS — K648 Other hemorrhoids: Secondary | ICD-10-CM

## 2013-03-18 NOTE — Progress Notes (Deleted)
  Subjective:    Patient ID: Sandra [name], female    DOB: June 18, 1960, 52 y.o.   MRN: [id number]  HPI    Review of Systems     Objective:   Physical Exam        Assessment & Plan:

## 2013-03-18 NOTE — Progress Notes (Signed)
PROCEDURE NOTE: The patient presents with symptomatic grade 2  hemorrhoids, requesting rubber band ligation of his/her hemorrhoidal disease.  All risks, benefits and alternative forms of therapy were described and informed consent was obtained.  The decision was made to band the RA and LL internal hemorrhoids, and the [facility] O'Regan System was used to perform band ligation without complication.  Digital anorectal examination was then performed to assure proper positioning of the band, and to adjust the banded tissue as required.  The patient was discharged home without pain or other issues.  Dietary and behavioral recommendations were given and along with follow-up instructions.     The following adjunctive treatments were recommended:  Continue Linzess 145 mcg qd  The patient will return as needed for  follow-up and possible additional banding as required. No complications were encountered and the patient tolerated the procedure well.

## 2013-03-18 NOTE — Patient Instructions (Signed)
HEMORRHOID BANDING PROCEDURE    FOLLOW-UP CARE   1. The procedure you have had should have been relatively painless since the banding of the area involved does not have nerve endings and there is no pain sensation.  The rubber band cuts off the blood supply to the hemorrhoid and the band may fall off as soon as 48 hours after the banding (the band may occasionally be seen in the toilet bowl following a bowel movement). You may notice a temporary feeling of fullness in the rectum which should respond adequately to plain Tylenol or Motrin.  2. Following the banding, avoid strenuous exercise that evening and resume full activity the next day.  A sitz bath (soaking in a warm tub) or bidet is soothing, and can be useful for cleansing the area after bowel movements.     3. To avoid constipation, take two tablespoons of natural wheat bran, natural oat bran, flax, Benefiber or any over the counter fiber supplement and increase your water intake to 7-8 glasses daily.    4. Unless you have been prescribed anorectal medication, do not put anything inside your rectum for two weeks: No suppositories, enemas, fingers, etc.  5. Occasionally, you may have more bleeding than usual after the banding procedure.  This is often from the untreated hemorrhoids rather than the treated one.  Don't be concerned if there is a tablespoon or so of blood.  If there is more blood than this, lie flat with your bottom higher than your head and apply an ice pack to the area. If the bleeding does not stop within a half an hour or if you feel faint, call our office at [phone] or go to the emergency room.  6. Problems are not common; however, if there is a substantial amount of bleeding, severe pain, chills, fever or difficulty passing urine (very rare) or other problems, you should call us at [phone] or report to the nearest emergency room.  7. Do not stay seated continuously for more than 2-3 hours for a day or two  after the procedure.  Tighten your buttock muscles 10-15 times every two hours and take 10-15 deep breaths every 1-2 hours.  Do not spend more than a few minutes on the toilet if you cannot empty your bowel; instead re-visit the toilet at a later time.      I appreciate the opportunity to care for you.         

## 2013-03-18 NOTE — Assessment & Plan Note (Signed)
LL and RA banded today Will discuss f/u with her over time May need additional banding

## 2013-04-01 ENCOUNTER — Other Ambulatory Visit: Payer: Self-pay

## 2013-04-01 MED ORDER — LINACLOTIDE 145 MCG PO CAPS: 145.0000 ug | ORAL_CAPSULE | Freq: Every day | ORAL | Status: DC

## 2013-04-01 NOTE — Telephone Encounter (Signed)
Samples given to patient as requested. Linzess is working well.

## 2013-05-12 ENCOUNTER — Ambulatory Visit (INDEPENDENT_AMBULATORY_CARE_PROVIDER_SITE_OTHER): Payer: Commercial Managed Care - PPO | Admitting: Surgery

## 2013-05-12 ENCOUNTER — Encounter (INDEPENDENT_AMBULATORY_CARE_PROVIDER_SITE_OTHER): Payer: Self-pay | Admitting: Surgery

## 2013-05-12 ENCOUNTER — Other Ambulatory Visit (INDEPENDENT_AMBULATORY_CARE_PROVIDER_SITE_OTHER): Payer: Self-pay

## 2013-05-12 ENCOUNTER — Encounter (INDEPENDENT_AMBULATORY_CARE_PROVIDER_SITE_OTHER): Payer: Self-pay

## 2013-05-12 DIAGNOSIS — I10 Essential (primary) hypertension: Secondary | ICD-10-CM

## 2013-05-12 DIAGNOSIS — E785 Hyperlipidemia, unspecified: Secondary | ICD-10-CM

## 2013-05-12 DIAGNOSIS — Z6838 Body mass index (BMI) 38.0-38.9, adult: Secondary | ICD-10-CM

## 2013-05-12 DIAGNOSIS — E119 Type 2 diabetes mellitus without complications: Secondary | ICD-10-CM

## 2013-05-12 DIAGNOSIS — G4733 Obstructive sleep apnea (adult) (pediatric): Secondary | ICD-10-CM

## 2013-05-12 HISTORY — DX: Morbid (severe) obesity due to excess calories: E66.01

## 2013-05-12 NOTE — Progress Notes (Addendum)
Re:   Sandra [name] DOB:   [birth year] MRN:   [id number]  ASSESSMENT AND PLAN: 1.  Morbid obesity  Initial weight - 203, BMI - 38.4  Per the 1991 NIH Consensus Statement, the patient is [name] candidate for bariatric surgery.  The patient attended our initial information session and reviewed the types of bariatric surgery.    The patient is undecided between  the Roux en Y Gastric Bypass and the Sleeve gastrectomy.  I discussed with the patient the indications and risks of bariatric surgery and tried to review both operations.  I told her that I have not done [name] Sleeve gastrectomy because this is [name] recently developed operation.  She knows people who have had each of the operations, so she has some insight.  I also gave her the www.realize.com website which compares the operations.  The potential risks of surgery include, but are not limited to, bleeding, infection, leak from the bowel, DVT and PE, open surgery, long term nutrition consequences, and death.  The patient understands the importance of compliance and long term follow-up with our group after surgery.  From here we will obtain lab tests, x-rays, nutrition consult, and psych consult. [Approved by Dr. Lawerance Cruel - 06/11/2013.  DN 06/23/2013]  2.  Hyperlipidemia  But has not been able to tolerate statins. 3.  Diabetes mellitus 4.  Hypertension 5.  OSA - on CPAP  Since 2001, but cannot tolerate CPAP. 6.  History of endometrial cancer 7.  CAD   Chief Complaint  Patient presents with  . Weight Loss Surgery    initial consult gastric bypass   REFERRING PHYSICIAN: BURNETT,BRENT A, MD  HISTORY OF PRESENT ILLNESS: Sandra [name] is [name] 53 y.o. (DOB: 09-28-1960)  white  female whose primary care physician is Sandra A, MD and comes to me today for discussion of bariatric surgery.  The patient heard Dr. Lodema Pilot at the information session. She is undecided between [name] Roux-en-Y gastric bypass and [name] sleeve gastrectomy as to which operation she  wants.  She has been overweight much of her adult life.  She will loose 20 to 30 pounds, only to regain it back.  She has [name] tendency to binge eat.  She's tried about every diet. These include: Weight Watchers, Atkins, low-calorie, TOPS, and Slim fast.  She has tried multiple non prescription meds for weight loss - Dexitrim, Garcened, and Green Coffee Bean.  He has at least 3 coworkers who have had weight loss surgery. One has had [name] Roux Y gastric bypass and 2 have had sleedve gastrectomies.  So she has some insight into the surgery and its postoperative course.    Past Medical History  Diagnosis Date  . Hyperlipidemia   . Diabetes mellitus   . Hypertension   . Adenomatous polyp 2011    BENIGN  . OSA on CPAP   . History of endometrial cancer 2006 ---- S/P ABD. HYSTERECTOMY    STAGE I ENDOMETROID CARCINOMA  . CAD (coronary artery disease) NON-OBSTRUCTIVE PER DR HOCHREIN NOTE 08-26-2011  . Complication of anesthesia PT STATES HAS LETTER FROM [place] [facility]. STATING THAT SHE HAS [name] VERY SMALL AIRWAY (LAST SURG. HYSTERECTOMY AT [facility] IN 2006    PT STATES Sandra BEASON CRNA WHOM WORKS HERE TOLD SHE WOULD BE OKAY TO BE DONE AT [facility]      Past Surgical History  Procedure Laterality Date  . Cesarean section  1989  . Abdominal hysterectomy  12-20-2004    TAH  .  Bilateral breast reduction  11-16-1999  . Uvulopalatopharyngoplasty, tonsillectomy and septoplasty  1994  . Cystoscopy  09/06/2011    Procedure: CYSTOSCOPY;  Surgeon: Ailene Rud, MD;  Location: [facility];  Service: Urology;  Laterality: N/[name];  LYNX SLING   . Pubovaginal sling  09/06/2011    Procedure: Gaynelle Arabian;  Surgeon: Ailene Rud, MD;  Location: [facility];  Service: Urology;  Laterality: N/[name];  . Hemorrhoid banding  02/2013      Current Outpatient Prescriptions  Medication Sig Dispense Refill  . AMBULATORY NON FORMULARY MEDICATION CPAP Machine      . benazepril (LOTENSIN) 40  MG tablet Take 40 mg by mouth every evening.      Marland Kitchen ibuprofen (ADVIL,MOTRIN) 800 MG tablet Take 800 mg by mouth every 8 (eight) hours as needed. For pain       . Linaclotide (LINZESS) 145 MCG CAPS capsule Take 1 capsule (145 mcg total) by mouth daily.  32 capsule  0  . metFORMIN (GLUCOPHAGE) 1000 MG tablet Take 1,000 mg by mouth 2 (two) times daily with [name] meal.      . Multiple Vitamins-Minerals (MULTIVITAMIN WITH MINERALS) tablet Take 1 tablet by mouth daily.      . Omega-3 Fatty Acids (FISH OIL) 1000 MG CAPS Take 2 capsules by mouth 2 (two) times daily.      . sitaGLIPtin (JANUVIA) 100 MG tablet Take 100 mg by mouth daily.        No current facility-administered medications for this visit.     No Known Allergies  REVIEW OF SYSTEMS: Skin:  No history of rash.  No history of abnormal moles. Infection:  No history of hepatitis or HIV.  No history of MRSA. Neurologic:  No history of stroke.  No history of seizure.  No history of headaches. Cardiac:  Hypertension x 25 years.  She had chest pain in 07/2011 which prompted an evaluation by Dr. Burt Knack which was neg.  Her follow up with him was PRN. Pulmonary:  OSA since 2001.  But cannot tolerate CPAP, so she does not use it most of the time. Breasts:  Had bilateral breast reduction in 2001 by Dr. [place] Nation.  Endocrine:  Diabetes mellitus x 10 years.  No thyroid disease. Gastrointestinal:  Had neg upper endo about 2008 by Dr. Collene Mares.  She was told that she had increasing LFT's on recent labs.  No history of gall bladder disease.  No history of pancreas disease.  Had neg colonoscopy by Dr. Collene Mares 2010. Urologic:  No history of kidney stones.  No history of bladder infections. GYN:  Hysterectomy - 2006 - for endometrial cancer by Dr. Hebert Soho. Musculoskeletal:  No history of joint or back disease. Hematologic:  No bleeding disorder.  No history of anemia.  Not anticoagulated. Psycho-social:  The patient is oriented.   The patient has no obvious  psychologic or social impairment to understanding our conversation and plan.  SOCIAL and FAMILY HISTORY: Married. Works at the [employer]. Has two children: Son and daughter. Her daughter is with her as is her granddaughter.  PHYSICAL EXAM: Ht 5\' 1"  (1.549 m)  Wt 203 lb (92.08 kg)  BMI 38.38 kg/m2  General: Obese WF who is alert and generally healthy appearing.  HEENT: Normal. Pupils equal. Neck: Supple. No mass.  No thyroid mass. Lymph Nodes:  No supraclavicular or cervical nodes. Lungs: Clear to auscultation and symmetric breath sounds. Heart:  RRR. No murmur or rub. Abdomen: Soft.  No mass. No tenderness. No hernia. Normal bowel sounds.  Has diastasis.  She is about half apple and half pear. Rectal: Not done. Extremities:  Good strength and ROM  in upper and lower extremities. Neurologic:  Grossly intact to motor and sensory function. Psychiatric: Has normal mood and affect. Behavior is normal.   DATA REVIEWED: Epic notes.  Alphonsa Overall, MD,  [facility], [address]  [place], [place]    [place] Phone:  [phone] FAX:  [phone]

## 2013-05-27 ENCOUNTER — Ambulatory Visit (HOSPITAL_COMMUNITY)
Admission: RE | Admit: 2013-05-27 | Discharge: 2013-05-27 | Disposition: A | Discharge status: Home / Self Care | Payer: 59 | Source: Ambulatory Visit | Attending: Surgery | Admitting: Surgery

## 2013-05-27 ENCOUNTER — Encounter (HOSPITAL_COMMUNITY)
Admission: RE | Disposition: A | Discharge status: Home / Self Care | Payer: Self-pay | Source: Ambulatory Visit | Attending: Surgery

## 2013-05-27 ENCOUNTER — Other Ambulatory Visit: Payer: Self-pay

## 2013-05-27 HISTORY — PX: BREATH TEK H PYLORI: SHX5422

## 2013-05-27 SURGERY — BREATH TEST, FOR HELICOBACTER PYLORI

## 2013-05-27 MED ORDER — LINACLOTIDE 145 MCG PO CAPS: 145.0000 ug | ORAL_CAPSULE | Freq: Every day | ORAL | Status: DC

## 2013-05-27 NOTE — Telephone Encounter (Signed)
Patient phoned in to request samples, delivered to her upstairs in endo.

## 2013-05-27 NOTE — Progress Notes (Signed)
05/27/13 [place]  Referring MD Lucia Gaskins  Time of Last PO Intake 2200  Baseline Breath At: 1572  Pranactin Given At: 0746  Post-Dose Breath At: 0801  Sample 1 3.2  Sample 2 2.7  Test Negative

## 2013-05-28 ENCOUNTER — Encounter (HOSPITAL_COMMUNITY): Payer: Self-pay | Admitting: Surgery

## 2013-06-04 ENCOUNTER — Other Ambulatory Visit: Payer: Self-pay

## 2013-06-04 ENCOUNTER — Ambulatory Visit (HOSPITAL_COMMUNITY)
Admission: RE | Admit: 2013-06-04 | Discharge: 2013-06-04 | Disposition: A | Discharge status: Home / Self Care | Payer: 59 | Source: Ambulatory Visit | Attending: Surgery | Admitting: Surgery

## 2013-06-04 DIAGNOSIS — K7689 Other specified diseases of liver: Secondary | ICD-10-CM | POA: Insufficient documentation

## 2013-06-04 DIAGNOSIS — I2510 Atherosclerotic heart disease of native coronary artery without angina pectoris: Secondary | ICD-10-CM | POA: Insufficient documentation

## 2013-06-04 DIAGNOSIS — Z6838 Body mass index (BMI) 38.0-38.9, adult: Secondary | ICD-10-CM | POA: Insufficient documentation

## 2013-06-04 DIAGNOSIS — I10 Essential (primary) hypertension: Secondary | ICD-10-CM | POA: Insufficient documentation

## 2013-06-04 DIAGNOSIS — E119 Type 2 diabetes mellitus without complications: Secondary | ICD-10-CM | POA: Insufficient documentation

## 2013-06-04 DIAGNOSIS — G4733 Obstructive sleep apnea (adult) (pediatric): Secondary | ICD-10-CM | POA: Insufficient documentation

## 2013-06-04 DIAGNOSIS — E785 Hyperlipidemia, unspecified: Secondary | ICD-10-CM | POA: Insufficient documentation

## 2013-06-11 ENCOUNTER — Encounter: Payer: Self-pay | Admitting: Dietician

## 2013-06-11 ENCOUNTER — Encounter: Payer: 59 | Attending: Surgery | Admitting: Dietician

## 2013-06-11 DIAGNOSIS — Z713 Dietary counseling and surveillance: Secondary | ICD-10-CM | POA: Insufficient documentation

## 2013-06-11 DIAGNOSIS — E669 Obesity, unspecified: Secondary | ICD-10-CM | POA: Insufficient documentation

## 2013-06-11 DIAGNOSIS — Z01818 Encounter for other preprocedural examination: Secondary | ICD-10-CM | POA: Insufficient documentation

## 2013-06-11 LAB — COMPREHENSIVE METABOLIC PANEL
ALT: 36 U/L — ABNORMAL HIGH (ref 0–35)
AST: 23 U/L (ref 0–37)
Albumin: 4.3 g/dL (ref 3.5–5.2)
Alkaline Phosphatase: 42 U/L (ref 39–117)
BUN: 16 mg/dL (ref 6–23)
CO2: 31 mEq/L (ref 19–32)
Calcium: 9.5 mg/dL (ref 8.4–10.5)
Chloride: 101 mEq/L (ref 96–112)
Creat: 0.54 mg/dL (ref 0.50–1.10)
Glucose, Bld: 162 mg/dL — ABNORMAL HIGH (ref 70–99)
Potassium: 4.6 mEq/L (ref 3.5–5.3)
Sodium: 141 mEq/L (ref 135–145)
Total Bilirubin: 0.5 mg/dL (ref 0.2–1.2)
Total Protein: 6.8 g/dL (ref 6.0–8.3)

## 2013-06-11 LAB — CBC WITH DIFFERENTIAL/PLATELET
Basophils Absolute: 0 10*3/uL (ref 0.0–0.1)
Basophils Relative: 0 % (ref 0–1)
Eosinophils Absolute: 0.1 10*3/uL (ref 0.0–0.7)
Eosinophils Relative: 2 % (ref 0–5)
HCT: 39.7 % (ref 36.0–46.0)
Hemoglobin: 14 g/dL (ref 12.0–15.0)
Lymphocytes Relative: 47 % — ABNORMAL HIGH (ref 12–46)
Lymphs Abs: 3.5 10*3/uL (ref 0.7–4.0)
MCH: 31.5 pg (ref 26.0–34.0)
MCHC: 35.3 g/dL (ref 30.0–36.0)
MCV: 89.4 fL (ref 78.0–100.0)
Monocytes Absolute: 0.5 10*3/uL (ref 0.1–1.0)
Monocytes Relative: 6 % (ref 3–12)
Neutro Abs: 3.4 10*3/uL (ref 1.7–7.7)
Neutrophils Relative %: 45 % (ref 43–77)
Platelets: 274 10*3/uL (ref 150–400)
RBC: 4.44 MIL/uL (ref 3.87–5.11)
RDW: 13.9 % (ref 11.5–15.5)
WBC: 7.5 10*3/uL (ref 4.0–10.5)

## 2013-06-11 LAB — TSH: TSH: 1.435 u[IU]/mL (ref 0.350–4.500)

## 2013-06-11 NOTE — Patient Instructions (Signed)
Patient to call the [facility] to enroll in Pre-Op and Post-Op Nutrition Education when surgery date is scheduled. 

## 2013-06-11 NOTE — Progress Notes (Signed)
  Pre-Op Assessment Visit:  Pre-Operative RYGB Surgery  Medical Nutrition Therapy:  Appt start time: 0800   End time:  0845.  Patient was seen on 06/11/13 for Pre-Operative RYGB Nutrition Assessment. Assessment and letter of approval faxed to [facility] Bariatric Surgery Program coordinator on 06/11/2013.   Handouts given during visit include:  Pre-Op Goals Bariatric Surgery Protein Shakes 101 Things to do besides eat  Faxed prescription for Nascobal on 06/11/2013  Patient to call the [facility] to enroll in Pre-Op and Post-Op Nutrition Education when surgery date is scheduled.

## 2013-06-12 LAB — HEMOGLOBIN A1C
Hgb A1c MFr Bld: 7.4 % — ABNORMAL HIGH (ref <5.7)
Mean Plasma Glucose: 166 mg/dL — ABNORMAL HIGH (ref <117)

## 2013-08-18 ENCOUNTER — Other Ambulatory Visit (INDEPENDENT_AMBULATORY_CARE_PROVIDER_SITE_OTHER): Payer: Self-pay | Admitting: Surgery

## 2013-09-06 ENCOUNTER — Encounter: Payer: 59 | Attending: Surgery

## 2013-09-06 DIAGNOSIS — E669 Obesity, unspecified: Secondary | ICD-10-CM | POA: Insufficient documentation

## 2013-09-06 DIAGNOSIS — Z713 Dietary counseling and surveillance: Secondary | ICD-10-CM | POA: Insufficient documentation

## 2013-09-06 DIAGNOSIS — Z01818 Encounter for other preprocedural examination: Secondary | ICD-10-CM | POA: Insufficient documentation

## 2013-09-07 NOTE — Progress Notes (Signed)
  Pre-Operative Nutrition Class:  Appt start time: 6484   End time:  1830.  Patient was seen on 09/06/2013 for Pre-Operative Bariatric Surgery Education at the [facility].   Surgery date: 10/04/2013 Surgery type: RYGB Start weight at [facility]: 196 on 06/11/2013 Weight today: 196.5 lbs  TANITA  BODY COMP RESULTS  09/06/13   BMI (kg/m^2) 37.1   Fat Mass (lbs) 83.5   Fat Free Mass (lbs) 113   Total Body Water (lbs) 82.5   Samples given per MNT protocol. Patient educated on appropriate usage: Premier protein shake (strawberry - qty 1) Lot #: [id number] Exp: 03/2014  Bariatric Advantage Calcium Citrate (wild cherry - qty 1) Lot #: [id number] Exp: 01/2014  Bariactiv MVI (qty 1) Lot #: [id number] Exp: 08/2014  Renee Pain Protein Powder (vanilla - qty 1) Lot #: [id number] Exp:  06/2014     The following the learning objectives were met by the patient during this course:  Identify Pre-Op Dietary Goals and will begin 2 weeks pre-operatively  Identify appropriate sources of fluids and proteins   State protein recommendations and appropriate sources pre and post-operatively  Identify Post-Operative Dietary Goals and will follow for 2 weeks post-operatively  Identify appropriate multivitamin and calcium sources  Describe the need for physical activity post-operatively and will follow MD recommendations  State when to call healthcare provider regarding medication questions or post-operative complications  Handouts given during class include:  Pre-Op Bariatric Surgery Diet Handout  Protein Shake Handout  Post-Op Bariatric Surgery Nutrition Handout  BELT Program Information Flyer  Support Group Information Flyer  [facility] Bariatric Supplements Price List  Follow-Up Plan: Patient will follow-up at [facility] 2 weeks post operatively for diet advancement per MD.

## 2013-09-21 ENCOUNTER — Encounter (HOSPITAL_COMMUNITY): Payer: Self-pay | Admitting: Pharmacy Technician

## 2013-09-23 ENCOUNTER — Ambulatory Visit (INDEPENDENT_AMBULATORY_CARE_PROVIDER_SITE_OTHER): Payer: Commercial Managed Care - PPO | Admitting: Surgery

## 2013-09-23 ENCOUNTER — Encounter (HOSPITAL_COMMUNITY): Payer: Self-pay

## 2013-09-23 ENCOUNTER — Encounter (INDEPENDENT_AMBULATORY_CARE_PROVIDER_SITE_OTHER): Payer: Self-pay | Admitting: Surgery

## 2013-09-23 ENCOUNTER — Other Ambulatory Visit (HOSPITAL_COMMUNITY): Payer: Self-pay | Admitting: Anesthesiology

## 2013-09-23 NOTE — Patient Instructions (Addendum)
20 HENCHY MCCAULEY  09/23/2013   Your procedure is scheduled on: Monday June 8th, 2015  Report to [facility] Main Entrance and follow signs to  [place] at  515 AM.  Call this number if you have problems the morning of surgery [phone]   Remember: bring cpap mask and tubing  Do not eat food or drink liquids :After Midnight.     Take these medicines the morning of surgery with A SIP OF WATER: no meds to take                               You may not have any metal on your body including hair pins and piercings  Do not wear jewelry, make-up, lotions, powders, or deodorant.   Men may shave face and neck.  Do not bring valuables to the hospital. [place].  Contacts, dentures or bridgework may not be worn into surgery.  Leave suitcase in the car. After surgery it may be brought to your room.  For patients admitted to the hospital, checkout time is 11:00 AM the day of discharge.   Patients discharged the day of surgery will not be allowed to drive home.  Name and phone number of your driver:  Special Instructions: N/A ________________________________________________________________________  [facility] - Preparing for Surgery Before surgery, you can play an important role.  Because skin is not sterile, your skin needs to be as free of germs as possible.  You can reduce the number of germs on your skin by washing with CHG (chlorahexidine gluconate) soap before surgery.  CHG is an antiseptic cleaner which kills germs and bonds with the skin to continue killing germs even after washing. Please DO NOT use if you have an allergy to CHG or antibacterial soaps.  If your skin becomes reddened/irritated stop using the CHG and inform your nurse when you arrive at Short Stay. Do not shave (including legs and underarms) for at least 48 hours prior to the first CHG shower.  You may shave your face/neck. Please follow these instructions  carefully:  1.  Shower with CHG Soap the night before surgery and the  morning of Surgery.  2.  If you choose to wash your hair, wash your hair first as usual with your  normal  shampoo.  3.  After you shampoo, rinse your hair and body thoroughly to remove the  shampoo.                           4.  Use CHG as you would any other liquid soap.  You can apply chg directly  to the skin and wash                       Gently with a scrungie or clean washcloth.  5.  Apply the CHG Soap to your body ONLY FROM THE NECK DOWN.   Do not use on face/ open                           Wound or open sores. Avoid contact with eyes, ears mouth and genitals (private parts).                       Wash face,  Genitals (private parts) with your normal  soap.             6.  Wash thoroughly, paying special attention to the area where your surgery  will be performed.  7.  Thoroughly rinse your body with warm water from the neck down.  8.  DO NOT shower/wash with your normal soap after using and rinsing off  the CHG Soap.                9.  Pat yourself dry with a clean towel.            10.  Wear clean pajamas.            11.  Place clean sheets on your bed the night of your first shower and do not  sleep with pets. Day of Surgery : Do not apply any lotions/deodorants the morning of surgery.  Please wear clean clothes to the hospital/surgery center.  FAILURE TO FOLLOW THESE INSTRUCTIONS MAY RESULT IN THE CANCELLATION OF YOUR SURGERY PATIENT SIGNATURE_________________________________  NURSE SIGNATURE__________________________________  ________________________________________________________________________

## 2013-09-23 NOTE — Progress Notes (Signed)
 Re:   Sandra [name] DOB:   02/18/1961 MRN:   [id number]  ASSESSMENT AND PLAN: 1.  Morbid obesity  Initial weight - 203, BMI - 38.4  Per the 1991 NIH Consensus Statement, the patient is a candidate for bariatric surgery.  The patient attended our initial information session and reviewed the types of bariatric surgery.    The patient decided to go with  the Roux en Y Gastric Bypass.  The potential risks of surgery include, but are not limited to, bleeding, infection, leak from the bowel, DVT and PE, open surgery, long term nutrition consequences, and death.  The patient understands the importance of compliance and long term follow-up with our group after surgery.  She has a good understanding of the operation.  I gave her the bowel prep (Golytely) and Oxycodone elixir.  She is scheduled for 10/04/2013.  2.  Hyperlipidemia  But has not been able to tolerate statins. 3.  Diabetes mellitus 4.  Hypertension 5.  OSA - on CPAP  Since 2001, but cannot tolerate CPAP. 6.  History of endometrial cancer 7.  CAD   Chief Complaint  Patient presents with  . Bariatric Pre-op    rynygb   REFERRING PHYSICIAN: BURNETT,BRENT A, MD  HISTORY OF PRESENT ILLNESS: Sandra [name] is a 52 y.o. (DOB: 01/25/1961)  white  female whose primary care physician is BURNETT,BRENT A, MD and comes to me today for discussion of bariatric surgery. She comes by herself. She has decided on the gastric bypass.  Last time I saw her, she was undecided. She saw Dr. Lurey several times who working on her emotional eating.  UGI - 06/04/2013 - Normal US abdomen - 06/05/2103 - negative Psych - Dr. Lurey -  Approved 06/11/2013  History of weight issues: The patient heard Dr. Brian Layton at the information session. She is undecided between a Roux-en-Y gastric bypass and a sleeve gastrectomy as to which operation she wants.  She has been overweight much of her adult life.  She will loose 20 to 30 pounds, only to regain it back.  She  has a tendency to binge eat.  She's tried about every diet. These include: Weight Watchers, Atkins, low-calorie, TOPS, and Slim fast.  She has tried multiple non prescription meds for weight loss - Dexitrim, Garcened, and Green Coffee Bean.  He has at least 3 coworkers who have had weight loss surgery. One has had a Roux Y gastric bypass and 2 have had sleedve gastrectomies.  So she has some insight into the surgery and its postoperative course.   Past Medical History  Diagnosis Date  . Hyperlipidemia   . Diabetes mellitus   . Hypertension   . Adenomatous polyp 2011    BENIGN  . OSA on CPAP   . History of endometrial cancer 2006 ---- S/P ABD. HYSTERECTOMY    STAGE I ENDOMETROID CARCINOMA  . CAD (coronary artery disease) NON-OBSTRUCTIVE PER DR HOCHREIN NOTE 08-26-2011  . Complication of anesthesia PT STATES HAS LETTER FROM [place] [facility]. STATING THAT SHE HAS A VERY SMALL AIRWAY (LAST SURG. [facility]    PT STATES LYNN BEASON CRNA WHOM WORKS HERE TOLD SHE WOULD BE OKAY TO BE DONE AT [facility]  . Cancer   . Heart murmur       Past Surgical History  Procedure Laterality Date  . Cesarean section  1989  . Abdominal hysterectomy  12-20-2004    TAH  . Bilateral breast reduction  11-16-1999  .   Uvulopalatopharyngoplasty, tonsillectomy and septoplasty  1994  . Cystoscopy  09/06/2011    Procedure: CYSTOSCOPY;  Surgeon: Sigmund I Tannenbaum, MD;  Location: [facility];  Service: Urology;  Laterality: N/A;  LYNX SLING   . Pubovaginal sling  09/06/2011    Procedure: PUBO-VAGINAL SLING;  Surgeon: Sigmund I Tannenbaum, MD;  Location: [facility];  Service: Urology;  Laterality: N/A;  . Hemorrhoid banding  02/2013  . Breath tek h pylori N/A 05/27/2013    Procedure: BREATH TEK H PYLORI;  Surgeon: Brandi Tomlinson H Vannah Nadal, MD;  Location: [facility];  Service: General;  Laterality: N/A;      Current Outpatient Prescriptions  Medication Sig Dispense Refill  .  benazepril (LOTENSIN) 40 MG tablet Take 40 mg by mouth every evening.      . cholecalciferol (VITAMIN D) 1000 UNITS tablet Take 2,000 Units by mouth 2 (two) times daily.      . ibuprofen (ADVIL,MOTRIN) 200 MG tablet Take 800 mg by mouth every 6 (six) hours as needed for mild pain or moderate pain.      . Linaclotide (LINZESS) 145 MCG CAPS capsule Take 145 mcg by mouth every other day.      . metFORMIN (GLUCOPHAGE) 1000 MG tablet Take 1,000 mg by mouth 2 (two) times daily with a meal.      . sitaGLIPtin (JANUVIA) 100 MG tablet Take 100 mg by mouth daily.        No current facility-administered medications for this visit.     No Known Allergies  REVIEW OF SYSTEMS: Skin:  No history of rash.  No history of abnormal moles. Infection:  No history of hepatitis or HIV.  No history of MRSA. Neurologic:  No history of stroke.  No history of seizure.  No history of headaches. Cardiac:  Hypertension x 25 years.  She had chest pain in 07/2011 which prompted an evaluation by Dr. Cooper which was neg.  Her follow up with him was PRN. Pulmonary:  OSA since 2001.  But cannot tolerate CPAP, so she does not use it most of the time. Breasts:  Had bilateral breast reduction in 2001 by Dr. B. Barber.  Endocrine:  Diabetes mellitus x 10 years.  No thyroid disease. Gastrointestinal:  Had neg upper endo about 2008 by Dr. Mann.  She was told that she had increasing LFT's on recent labs.  No history of gall bladder disease.  No history of pancreas disease.  Had neg colonoscopy by Dr. Mann 2010. Urologic:  No history of kidney stones.  No history of bladder infections. GYN:  Hysterectomy - 2006 - for endometrial cancer by Dr. J. Fernandez. Musculoskeletal:  No history of joint or back disease. Hematologic:  No bleeding disorder.  No history of anemia.  Not anticoagulated. Psycho-social:  Saw Dr. Lurey preop.  SOCIAL and FAMILY HISTORY: Married. Works at the [employer] unit. Has two children: Son and daughter.     PHYSICAL EXAM: BP 120/76  Pulse 82  Temp(Src) 97.6 F (36.4 C) (Temporal)  Resp 16  Ht 5' 1" (1.549 m)  Wt 196 lb 9.6 oz (89.177 kg)  BMI 37.17 kg/m2  General: Obese WF who is alert and generally healthy appearing.  HEENT: Normal. Pupils equal. Neck: Supple. No mass.  No thyroid mass. Lymph Nodes:  No supraclavicular or cervical nodes. Lungs: Clear to auscultation and symmetric breath sounds. Heart:  RRR. No murmur or rub. Abdomen: Soft. No mass. No tenderness. No hernia. Normal bowel sounds.  Has diastasis.    She is about half apple and half pear. Rectal: Not done. Extremities:  Good strength and ROM  in upper and lower extremities. Neurologic:  Grossly intact to motor and sensory function. Psychiatric: Has normal mood and affect. Behavior is normal.   DATA REVIEWED: Epic notes.  Sorcha Rotunno, MD,  FACS [facility] [address]  [address]   [place], [place]    [address] Phone:  [phone] FAX:  [phone]  

## 2013-09-23 NOTE — Progress Notes (Signed)
ekg 06-04-13 epic Chest 2 view xray 12-31-12 epic

## 2013-09-27 ENCOUNTER — Encounter (HOSPITAL_COMMUNITY): Payer: Self-pay

## 2013-09-27 ENCOUNTER — Encounter (HOSPITAL_COMMUNITY)
Admission: RE | Admit: 2013-09-27 | Discharge: 2013-09-27 | Disposition: A | Discharge status: Home / Self Care | Payer: 59 | Source: Ambulatory Visit | Attending: Surgery | Admitting: Surgery

## 2013-09-27 DIAGNOSIS — Z01812 Encounter for preprocedural laboratory examination: Secondary | ICD-10-CM | POA: Insufficient documentation

## 2013-09-27 LAB — COMPREHENSIVE METABOLIC PANEL
ALT: 54 U/L — ABNORMAL HIGH (ref 0–35)
AST: 36 U/L (ref 0–37)
Albumin: 4.7 g/dL (ref 3.5–5.2)
Alkaline Phosphatase: 55 U/L (ref 39–117)
BUN: 25 mg/dL — ABNORMAL HIGH (ref 6–23)
CO2: 28 mEq/L (ref 19–32)
Calcium: 10.4 mg/dL (ref 8.4–10.5)
Chloride: 99 mEq/L (ref 96–112)
Creatinine, Ser: 0.63 mg/dL (ref 0.50–1.10)
GFR calc Af Amer: >90 mL/min (ref >90)
GFR calc non Af Amer: >90 mL/min (ref >90)
Glucose, Bld: 141 mg/dL — ABNORMAL HIGH (ref 70–99)
Potassium: 4.7 mEq/L (ref 3.7–5.3)
Sodium: 140 mEq/L (ref 137–147)
Total Bilirubin: 0.5 mg/dL (ref 0.3–1.2)
Total Protein: 7.8 g/dL (ref 6.0–8.3)

## 2013-09-27 LAB — CBC WITH DIFFERENTIAL/PLATELET
Basophils Absolute: 0.1 10*3/uL (ref 0.0–0.1)
Basophils Relative: 1 % (ref 0–1)
Eosinophils Absolute: 0.2 10*3/uL (ref 0.0–0.7)
Eosinophils Relative: 2 % (ref 0–5)
HCT: 42.9 % (ref 36.0–46.0)
Hemoglobin: 14.4 g/dL (ref 12.0–15.0)
Lymphocytes Relative: 49 % — ABNORMAL HIGH (ref 12–46)
Lymphs Abs: 4.1 10*3/uL — ABNORMAL HIGH (ref 0.7–4.0)
MCH: 30.9 pg (ref 26.0–34.0)
MCHC: 33.6 g/dL (ref 30.0–36.0)
MCV: 92.1 fL (ref 78.0–100.0)
Monocytes Absolute: 0.7 10*3/uL (ref 0.1–1.0)
Monocytes Relative: 9 % (ref 3–12)
Neutro Abs: 3.1 10*3/uL (ref 1.7–7.7)
Neutrophils Relative %: 39 % — ABNORMAL LOW (ref 43–77)
Platelets: 277 10*3/uL (ref 150–400)
RBC: 4.66 MIL/uL (ref 3.87–5.11)
RDW: 13 % (ref 11.5–15.5)
WBC: 8.1 10*3/uL (ref 4.0–10.5)

## 2013-09-27 NOTE — Progress Notes (Signed)
bmet results routed to dr newman inbasket by epic 

## 2013-10-03 NOTE — Anesthesia Preprocedure Evaluation (Addendum)
Anesthesia Evaluation  Patient identified by MRN, date of birth, ID band Patient awake    Reviewed: Allergy & Precautions, H&P , NPO status , Patient's Chart, lab work & pertinent test results  History of Anesthesia Complications (+) DIFFICULT AIRWAY  Airway Mallampati: III TM Distance: >3 FB Neck ROM: full    Dental no notable dental hx. (+) Teeth Intact, Dental Advisory Given   Pulmonary neg pulmonary ROS, sleep apnea and Continuous Positive Airway Pressure Ventilation ,  breath sounds clear to auscultation  Pulmonary exam normal       Cardiovascular hypertension, Pt. on medications + CAD Rhythm:regular Rate:Normal  Non-obstructive CAD 4/13   Neuro/Psych negative neurological ROS  negative psych ROS   GI/Hepatic negative GI ROS, Neg liver ROS,   Endo/Other  diabetes, Well Controlled, Type 2, Oral Hypoglycemic AgentsMorbid obesity  Renal/GU negative Renal ROS  negative genitourinary   Musculoskeletal   Abdominal (+) + obese,   Peds  Hematology negative hematology ROS (+)   Anesthesia Other Findings   Reproductive/Obstetrics negative OB ROS                         Anesthesia Physical Anesthesia Plan  ASA: III  Anesthesia Plan: General   Post-op Pain Management:    Induction: Intravenous  Airway Management Planned: Oral ETT and Video Laryngoscope Planned  Additional Equipment:   Intra-op Plan:   Post-operative Plan: Extubation in OR  Informed Consent: I have reviewed the patients History and Physical, chart, labs and discussed the procedure including the risks, benefits and alternatives for the proposed anesthesia with the patient or authorized representative who has indicated his/her understanding and acceptance.   Dental Advisory Given  Plan Discussed with: CRNA and Surgeon  Anesthesia Plan Comments:         Anesthesia Quick Evaluation

## 2013-10-04 ENCOUNTER — Encounter (HOSPITAL_COMMUNITY)
Admission: RE | Disposition: A | Discharge status: Home / Self Care | Payer: Self-pay | Source: Ambulatory Visit | Attending: Surgery

## 2013-10-04 ENCOUNTER — Encounter (HOSPITAL_COMMUNITY): Payer: Self-pay | Admitting: *Deleted

## 2013-10-04 ENCOUNTER — Encounter (HOSPITAL_COMMUNITY): Payer: 59 | Admitting: Anesthesiology

## 2013-10-04 ENCOUNTER — Inpatient Hospital Stay (HOSPITAL_COMMUNITY)
Admission: RE | Admit: 2013-10-04 | Discharge: 2013-10-06 | DRG: 621 | Disposition: A | Discharge status: Home / Self Care | Payer: 59 | Source: Ambulatory Visit | Attending: Surgery | Admitting: Surgery

## 2013-10-04 ENCOUNTER — Inpatient Hospital Stay (HOSPITAL_COMMUNITY): Payer: 59 | Admitting: Anesthesiology

## 2013-10-04 DIAGNOSIS — Z6838 Body mass index (BMI) 38.0-38.9, adult: Secondary | ICD-10-CM

## 2013-10-04 DIAGNOSIS — Z90710 Acquired absence of both cervix and uterus: Secondary | ICD-10-CM

## 2013-10-04 DIAGNOSIS — E119 Type 2 diabetes mellitus without complications: Secondary | ICD-10-CM | POA: Diagnosis present

## 2013-10-04 DIAGNOSIS — Z01812 Encounter for preprocedural laboratory examination: Secondary | ICD-10-CM

## 2013-10-04 DIAGNOSIS — G4733 Obstructive sleep apnea (adult) (pediatric): Secondary | ICD-10-CM | POA: Diagnosis present

## 2013-10-04 DIAGNOSIS — Z79899 Other long term (current) drug therapy: Secondary | ICD-10-CM

## 2013-10-04 DIAGNOSIS — I10 Essential (primary) hypertension: Secondary | ICD-10-CM | POA: Diagnosis present

## 2013-10-04 DIAGNOSIS — E785 Hyperlipidemia, unspecified: Secondary | ICD-10-CM | POA: Diagnosis present

## 2013-10-04 DIAGNOSIS — Z8542 Personal history of malignant neoplasm of other parts of uterus: Secondary | ICD-10-CM

## 2013-10-04 DIAGNOSIS — I2510 Atherosclerotic heart disease of native coronary artery without angina pectoris: Secondary | ICD-10-CM | POA: Diagnosis present

## 2013-10-04 HISTORY — PX: GASTRIC BYPASS: SHX52

## 2013-10-04 HISTORY — PX: GASTRIC ROUX-EN-Y: SHX5262

## 2013-10-04 LAB — GLUCOSE, CAPILLARY
Glucose-Capillary: 105 mg/dL — ABNORMAL HIGH (ref 70–99)
Glucose-Capillary: 211 mg/dL — ABNORMAL HIGH (ref 70–99)
Glucose-Capillary: 184 mg/dL — ABNORMAL HIGH (ref 70–99)
Glucose-Capillary: 130 mg/dL — ABNORMAL HIGH (ref 70–99)

## 2013-10-04 LAB — HEMOGLOBIN AND HEMATOCRIT, BLOOD
HCT: 37.3 % (ref 36.0–46.0)
Hemoglobin: 12.7 g/dL (ref 12.0–15.0)

## 2013-10-04 SURGERY — LAPAROSCOPIC ROUX-EN-Y GASTRIC BYPASS WITH UPPER ENDOSCOPY
Anesthesia: General

## 2013-10-04 MED ORDER — CHLORHEXIDINE GLUCONATE 0.12 % MT SOLN
15.0000 mL | Freq: Two times a day (BID) | OROMUCOSAL | Status: DC
  Administered 2013-10-04 – 2013-10-05 (×3): 15 mL via OROMUCOSAL
  Filled 2013-10-04 (×6): qty 15

## 2013-10-04 MED ORDER — LACTATED RINGERS IR SOLN
Status: DC | PRN
  Administered 2013-10-04: 3000 mL

## 2013-10-04 MED ORDER — ROCURONIUM BROMIDE 100 MG/10ML IV SOLN
INTRAVENOUS | Status: DC | PRN
  Administered 2013-10-04: 10 mg via INTRAVENOUS
  Administered 2013-10-04: 20 mg via INTRAVENOUS
  Administered 2013-10-04: 10 mg via INTRAVENOUS
  Administered 2013-10-04: 40 mg via INTRAVENOUS

## 2013-10-04 MED ORDER — PROPOFOL 10 MG/ML IV BOLUS
INTRAVENOUS | Status: AC
  Filled 2013-10-04: qty 20

## 2013-10-04 MED ORDER — PROPOFOL 10 MG/ML IV BOLUS
INTRAVENOUS | Status: DC | PRN
  Administered 2013-10-04: 100 mg via INTRAVENOUS

## 2013-10-04 MED ORDER — INSULIN ASPART 100 UNIT/ML ~~LOC~~ SOLN
SUBCUTANEOUS | Status: AC
  Filled 2013-10-04: qty 1

## 2013-10-04 MED ORDER — ROCURONIUM BROMIDE 100 MG/10ML IV SOLN
INTRAVENOUS | Status: AC
  Filled 2013-10-04: qty 1

## 2013-10-04 MED ORDER — HEPARIN SODIUM (PORCINE) 5000 UNIT/ML IJ SOLN
5000.0000 [IU] | INTRAMUSCULAR | Status: AC
  Administered 2013-10-04: 5000 [IU] via SUBCUTANEOUS
  Filled 2013-10-04: qty 1

## 2013-10-04 MED ORDER — DEXTROSE 5 % IV SOLN
INTRAVENOUS | Status: AC
  Filled 2013-10-04: qty 2

## 2013-10-04 MED ORDER — LIDOCAINE HCL (CARDIAC) 20 MG/ML IV SOLN
INTRAVENOUS | Status: AC
  Filled 2013-10-04: qty 5

## 2013-10-04 MED ORDER — SUCCINYLCHOLINE CHLORIDE 20 MG/ML IJ SOLN
INTRAMUSCULAR | Status: DC | PRN
  Administered 2013-10-04: 100 mg via INTRAVENOUS

## 2013-10-04 MED ORDER — POTASSIUM CHLORIDE IN NACL 20-0.45 MEQ/L-% IV SOLN
INTRAVENOUS | Status: DC
  Administered 2013-10-04 – 2013-10-06 (×6): via INTRAVENOUS
  Filled 2013-10-04 (×7): qty 1000

## 2013-10-04 MED ORDER — LACTATED RINGERS IV SOLN: INTRAVENOUS | Status: DC

## 2013-10-04 MED ORDER — CEFOXITIN SODIUM 2 G IV SOLR
2.0000 g | Freq: Once | INTRAVENOUS | Status: AC
  Administered 2013-10-04: 2 g via INTRAVENOUS
  Filled 2013-10-04: qty 2

## 2013-10-04 MED ORDER — TISSEEL VH 10 ML EX KIT
PACK | CUTANEOUS | Status: AC
  Filled 2013-10-04: qty 2

## 2013-10-04 MED ORDER — HYDROMORPHONE HCL PF 1 MG/ML IJ SOLN
INTRAMUSCULAR | Status: AC
  Filled 2013-10-04: qty 1

## 2013-10-04 MED ORDER — ACETAMINOPHEN 160 MG/5ML PO SOLN: 325.0000 mg | ORAL | Status: DC | PRN

## 2013-10-04 MED ORDER — ACETAMINOPHEN 160 MG/5ML PO SOLN: 650.0000 mg | ORAL | Status: DC | PRN

## 2013-10-04 MED ORDER — EPHEDRINE SULFATE 50 MG/ML IJ SOLN
INTRAMUSCULAR | Status: DC | PRN
  Administered 2013-10-04 (×2): 5 mg via INTRAVENOUS

## 2013-10-04 MED ORDER — GLYCOPYRROLATE 0.2 MG/ML IJ SOLN
INTRAMUSCULAR | Status: DC | PRN
  Administered 2013-10-04: .8 mg via INTRAVENOUS

## 2013-10-04 MED ORDER — MIDAZOLAM HCL 5 MG/5ML IJ SOLN
INTRAMUSCULAR | Status: DC | PRN
  Administered 2013-10-04: 2 mg via INTRAVENOUS

## 2013-10-04 MED ORDER — BUPIVACAINE HCL (PF) 0.25 % IJ SOLN
INTRAMUSCULAR | Status: DC | PRN
  Administered 2013-10-04: 30 mL

## 2013-10-04 MED ORDER — PNEUMOCOCCAL VAC POLYVALENT 25 MCG/0.5ML IJ INJ
0.5000 mL | INJECTION | INTRAMUSCULAR | Status: AC
  Administered 2013-10-05: 0.5 mL via INTRAMUSCULAR
  Filled 2013-10-04 (×2): qty 0.5

## 2013-10-04 MED ORDER — HYDROMORPHONE HCL PF 2 MG/ML IJ SOLN
INTRAMUSCULAR | Status: AC
  Filled 2013-10-04: qty 1

## 2013-10-04 MED ORDER — SODIUM CHLORIDE 0.9 % IJ SOLN
INTRAMUSCULAR | Status: AC
  Filled 2013-10-04: qty 10

## 2013-10-04 MED ORDER — ONDANSETRON HCL 4 MG/2ML IJ SOLN
INTRAMUSCULAR | Status: DC | PRN
  Administered 2013-10-04: 4 mg via INTRAVENOUS

## 2013-10-04 MED ORDER — EPHEDRINE SULFATE 50 MG/ML IJ SOLN
INTRAMUSCULAR | Status: AC
  Filled 2013-10-04: qty 1

## 2013-10-04 MED ORDER — STERILE WATER FOR IRRIGATION IR SOLN
Status: DC | PRN
  Administered 2013-10-04: 1500 mL

## 2013-10-04 MED ORDER — BIOTENE DRY MOUTH MT LIQD
15.0000 mL | Freq: Two times a day (BID) | OROMUCOSAL | Status: DC
  Administered 2013-10-04 – 2013-10-06 (×4): 15 mL via OROMUCOSAL

## 2013-10-04 MED ORDER — OXYCODONE HCL 5 MG/5ML PO SOLN
5.0000 mg | ORAL | Status: DC | PRN
  Administered 2013-10-05 – 2013-10-06 (×2): 5 mg via ORAL
  Administered 2013-10-06 (×2): 10 mg via ORAL
  Filled 2013-10-04: qty 5
  Filled 2013-10-04 (×2): qty 10
  Filled 2013-10-04 (×3): qty 5
  Filled 2013-10-04: qty 10

## 2013-10-04 MED ORDER — ATROPINE SULFATE 0.4 MG/ML IJ SOLN
INTRAMUSCULAR | Status: AC
  Filled 2013-10-04: qty 2

## 2013-10-04 MED ORDER — INSULIN ASPART 100 UNIT/ML ~~LOC~~ SOLN
0.0000 [IU] | SUBCUTANEOUS | Status: DC
  Administered 2013-10-04: 7 [IU] via SUBCUTANEOUS
  Administered 2013-10-04: 2 [IU] via SUBCUTANEOUS
  Administered 2013-10-04: 4 [IU] via SUBCUTANEOUS
  Administered 2013-10-05: 3 [IU] via SUBCUTANEOUS

## 2013-10-04 MED ORDER — ONDANSETRON HCL 4 MG/2ML IJ SOLN
INTRAMUSCULAR | Status: AC
  Filled 2013-10-04: qty 2

## 2013-10-04 MED ORDER — MORPHINE SULFATE 2 MG/ML IJ SOLN
2.0000 mg | INTRAMUSCULAR | Status: DC | PRN
  Administered 2013-10-04 (×4): 2 mg via INTRAVENOUS
  Administered 2013-10-05: 4 mg via INTRAVENOUS
  Administered 2013-10-05: 2 mg via INTRAVENOUS
  Administered 2013-10-05: 4 mg via INTRAVENOUS
  Administered 2013-10-05 (×2): 2 mg via INTRAVENOUS
  Filled 2013-10-04: qty 1
  Filled 2013-10-04: qty 2
  Filled 2013-10-04 (×5): qty 1
  Filled 2013-10-04: qty 2
  Filled 2013-10-04: qty 1

## 2013-10-04 MED ORDER — BUPIVACAINE HCL (PF) 0.25 % IJ SOLN
INTRAMUSCULAR | Status: AC
  Filled 2013-10-04: qty 30

## 2013-10-04 MED ORDER — UNJURY CHICKEN SOUP POWDER: 2.0000 [oz_av] | Freq: Four times a day (QID) | ORAL | Status: DC

## 2013-10-04 MED ORDER — DEXTROSE 5 % IV SOLN
2.0000 g | INTRAVENOUS | Status: AC
  Administered 2013-10-04: 2 g via INTRAVENOUS

## 2013-10-04 MED ORDER — EVICEL 5 ML EX KIT
PACK | CUTANEOUS | Status: DC | PRN
  Administered 2013-10-04: 3

## 2013-10-04 MED ORDER — HYDROMORPHONE HCL PF 1 MG/ML IJ SOLN
0.2500 mg | INTRAMUSCULAR | Status: DC | PRN
  Administered 2013-10-04 (×3): 0.5 mg via INTRAVENOUS

## 2013-10-04 MED ORDER — UNJURY CHOCOLATE CLASSIC POWDER
2.0000 [oz_av] | Freq: Four times a day (QID) | ORAL | Status: DC
  Administered 2013-10-06: 2 [oz_av] via ORAL

## 2013-10-04 MED ORDER — UNJURY VANILLA POWDER: 2.0000 [oz_av] | Freq: Four times a day (QID) | ORAL | Status: DC

## 2013-10-04 MED ORDER — LIDOCAINE HCL (CARDIAC) 20 MG/ML IV SOLN
INTRAVENOUS | Status: DC | PRN
  Administered 2013-10-04: 100 mg via INTRAVENOUS

## 2013-10-04 MED ORDER — NEOSTIGMINE METHYLSULFATE 10 MG/10ML IV SOLN
INTRAVENOUS | Status: DC | PRN
  Administered 2013-10-04: 5 mg via INTRAVENOUS

## 2013-10-04 MED ORDER — 0.9 % SODIUM CHLORIDE (POUR BTL) OPTIME
TOPICAL | Status: DC | PRN
  Administered 2013-10-04: 1000 mL

## 2013-10-04 MED ORDER — FENTANYL CITRATE 0.05 MG/ML IJ SOLN
INTRAMUSCULAR | Status: DC | PRN
  Administered 2013-10-04 (×5): 50 ug via INTRAVENOUS

## 2013-10-04 MED ORDER — HYDROMORPHONE HCL PF 1 MG/ML IJ SOLN
INTRAMUSCULAR | Status: DC | PRN
  Administered 2013-10-04 (×2): 0.5 mg via INTRAVENOUS

## 2013-10-04 MED ORDER — LACTATED RINGERS IV SOLN
INTRAVENOUS | Status: DC | PRN
Start: 2013-10-04 — End: 2013-10-04
  Administered 2013-10-04: 07:00:00 via INTRAVENOUS

## 2013-10-04 MED ORDER — HEPARIN SODIUM (PORCINE) 5000 UNIT/ML IJ SOLN
5000.0000 [IU] | Freq: Three times a day (TID) | INTRAMUSCULAR | Status: DC
  Administered 2013-10-04 – 2013-10-06 (×5): 5000 [IU] via SUBCUTANEOUS
  Filled 2013-10-04 (×8): qty 1

## 2013-10-04 MED ORDER — MIDAZOLAM HCL 2 MG/2ML IJ SOLN
INTRAMUSCULAR | Status: AC
  Filled 2013-10-04: qty 2

## 2013-10-04 MED ORDER — DEXAMETHASONE SODIUM PHOSPHATE 10 MG/ML IJ SOLN
INTRAMUSCULAR | Status: DC | PRN
  Administered 2013-10-04: 10 mg via INTRAVENOUS

## 2013-10-04 MED ORDER — ONDANSETRON HCL 4 MG/2ML IJ SOLN
4.0000 mg | INTRAMUSCULAR | Status: DC | PRN
  Administered 2013-10-05: 4 mg via INTRAVENOUS
  Filled 2013-10-04: qty 2

## 2013-10-04 MED ORDER — TISSEEL VH 10 ML EX KIT
PACK | CUTANEOUS | Status: AC
  Filled 2013-10-04: qty 1

## 2013-10-04 MED ORDER — GLYCOPYRROLATE 0.2 MG/ML IJ SOLN
INTRAMUSCULAR | Status: AC
  Filled 2013-10-04: qty 4

## 2013-10-04 MED ORDER — NEOSTIGMINE METHYLSULFATE 10 MG/10ML IV SOLN
INTRAVENOUS | Status: AC
  Filled 2013-10-04: qty 1

## 2013-10-04 MED ORDER — FENTANYL CITRATE 0.05 MG/ML IJ SOLN
INTRAMUSCULAR | Status: AC
  Filled 2013-10-04: qty 5

## 2013-10-04 SURGICAL SUPPLY — 60 items
APPLICATOR COTTON TIP 6IN STRL (MISCELLANEOUS) IMPLANT
BLADE SURG 15 STRL LF DISP TIS (BLADE) ×1 IMPLANT
BLADE SURG 15 STRL SS (BLADE) ×1
CABLE HIGH FREQUENCY MONO STRZ (ELECTRODE) ×2 IMPLANT
CHLORAPREP W/TINT 26ML (MISCELLANEOUS) ×4 IMPLANT
CLIP SUT LAPRA TY ABSORB (SUTURE) ×6 IMPLANT
CUTTER LINEAR ENDO ART 45 ETS (STAPLE) ×2 IMPLANT
DECANTER SPIKE VIAL GLASS SM (MISCELLANEOUS) IMPLANT
DERMABOND ADVANCED (GAUZE/BANDAGES/DRESSINGS) ×1
DERMABOND ADVANCED .7 DNX12 (GAUZE/BANDAGES/DRESSINGS) ×1 IMPLANT
DEVICE SUTURE ENDOST 10MM (ENDOMECHANICALS) ×2 IMPLANT
DISSECTOR BLUNT TIP ENDO 5MM (MISCELLANEOUS) IMPLANT
DRAIN PENROSE 18X1/4 LTX STRL (WOUND CARE) ×2 IMPLANT
DRAPE CAMERA CLOSED 9X96 (DRAPES) ×2 IMPLANT
DUPLOJECT EASY PREP 4ML (MISCELLANEOUS) IMPLANT
FLOSHIELD STORZ OLYMP 10MM 45 (MISCELLANEOUS) ×2 IMPLANT
GAUZE SPONGE 4X4 16PLY XRAY LF (GAUZE/BANDAGES/DRESSINGS) ×2 IMPLANT
GLOVE SURG SIGNA 7.5 PF LTX (GLOVE) ×2 IMPLANT
GOWN SPEC L4 XLG W/TWL (GOWN DISPOSABLE) ×2 IMPLANT
GOWN STRL REUS W/ TWL XL LVL3 (GOWN DISPOSABLE) ×5 IMPLANT
GOWN STRL REUS W/TWL XL LVL3 (GOWN DISPOSABLE) ×5
HOVERMATT SINGLE USE (MISCELLANEOUS) ×2 IMPLANT
KIT BASIN OR (CUSTOM PROCEDURE TRAY) ×2 IMPLANT
KIT GASTRIC LAVAGE 34FR ADT (SET/KITS/TRAYS/PACK) ×2 IMPLANT
NEEDLE SPNL 22GX3.5 QUINCKE BK (NEEDLE) ×2 IMPLANT
PACK CARDIOVASCULAR III (CUSTOM PROCEDURE TRAY) ×2 IMPLANT
PEN SKIN MARKING BROAD (MISCELLANEOUS) ×2 IMPLANT
POUCH SPECIMEN RETRIEVAL 10MM (ENDOMECHANICALS) IMPLANT
RELOAD 45 VASCULAR/THIN (ENDOMECHANICALS) ×6 IMPLANT
RELOAD BLUE (STAPLE) ×4 IMPLANT
RELOAD ENDO STITCH 2.0 (ENDOMECHANICALS) ×13
RELOAD GOLD (STAPLE) IMPLANT
RELOAD STAPLE TA45 3.5 REG BLU (ENDOMECHANICALS) ×6 IMPLANT
RELOAD WHITE ECR60W (STAPLE) IMPLANT
SCISSORS LAP 5X35 DISP (ENDOMECHANICALS) ×2 IMPLANT
SEALANT SURGICAL APPL DUAL CAN (MISCELLANEOUS) ×2 IMPLANT
SET IRRIG TUBING LAPAROSCOPIC (IRRIGATION / IRRIGATOR) ×2 IMPLANT
SHEARS HARMONIC ACE PLUS 36CM (ENDOMECHANICALS) ×2 IMPLANT
SLEEVE ENDOPATH XCEL 5M (ENDOMECHANICALS) ×2 IMPLANT
SOLUTION ANTI FOG 6CC (MISCELLANEOUS) ×2 IMPLANT
SPONGE GAUZE 4X4 12PLY (GAUZE/BANDAGES/DRESSINGS) IMPLANT
STAPLE ECHEON FLEX 60 POW ENDO (STAPLE) ×2 IMPLANT
STAPLER VISISTAT 35W (STAPLE) ×2 IMPLANT
SUT MON AB 5-0 PS2 18 (SUTURE) ×4 IMPLANT
SUT RELOAD ENDO STITCH 2 48X1 (ENDOMECHANICALS) ×9
SUT RELOAD ENDO STITCH 2.0 (ENDOMECHANICALS) ×4
SUT VIC AB 2-0 SH 27 (SUTURE) ×2
SUT VIC AB 2-0 SH 27X BRD (SUTURE) ×2 IMPLANT
SUTURE RELOAD END STTCH 2 48X1 (ENDOMECHANICALS) ×9 IMPLANT
SUTURE RELOAD ENDO STITCH 2.0 (ENDOMECHANICALS) ×4 IMPLANT
SYR 20CC LL (SYRINGE) ×4 IMPLANT
SYR 50ML LL SCALE MARK (SYRINGE) ×2 IMPLANT
TOWEL OR 17X26 10 PK STRL BLUE (TOWEL DISPOSABLE) ×2 IMPLANT
TRAY FOLEY CATH 14FRSI W/METER (CATHETERS) ×2 IMPLANT
TROCAR BLADELESS OPT 5 100 (ENDOMECHANICALS) ×2 IMPLANT
TROCAR UNIVERSAL OPT 12M 100M (ENDOMECHANICALS) ×6 IMPLANT
TROCAR XCEL 12X100 BLDLESS (ENDOMECHANICALS) ×2 IMPLANT
TROCAR XCEL NON-BLD 11X100MML (ENDOMECHANICALS) ×2 IMPLANT
TUBING ENDO SMARTCAP (MISCELLANEOUS) ×2 IMPLANT
TUBING FILTER THERMOFLATOR (ELECTROSURGICAL) ×2 IMPLANT

## 2013-10-04 NOTE — Transfer of Care (Signed)
Immediate Anesthesia Transfer of Care Note  Patient: Sandra [name]  Procedure(s) Performed: Procedure(s): LAPAROSCOPIC ROUX-EN-Y GASTRIC BYPASS WITH UPPER ENDOSCOPY (N/A)  Patient Location: PACU  Anesthesia Type:General  Level of Consciousness: awake, alert , oriented and patient cooperative  Airway & Oxygen Therapy: Patient Spontanous Breathing and Patient connected to face mask oxygen  Post-op Assessment: Report given to PACU RN, Post -op Vital signs reviewed and stable and Patient moving all extremities  Post vital signs: Reviewed and stable  Complications: No apparent anesthesia complications

## 2013-10-04 NOTE — Interval H&P Note (Signed)
History and Physical Interval Note:  10/04/2013 7:02 AM  Sandra [name]  has presented today for surgery, with the diagnosis of morbid obesity   The various methods of treatment have been discussed with the patient and family.  Her husband is here with her.  After consideration of risks, benefits and other options for treatment, the patient has consented to  Procedure(s): LAPAROSCOPIC ROUX-EN-Y GASTRIC BYPASS WITH UPPER ENDOSCOPY (N/A) as a surgical intervention .  The patient's history has been reviewed, patient examined, no change in status, stable for surgery.  I have reviewed the patient's chart and labs.  Questions were answered to the patient's satisfaction.     Shann Medal

## 2013-10-04 NOTE — H&P (View-Only) (Signed)
Re:   Sandra [name] DOB:   10-Aug-1960 MRN:   [id number]  ASSESSMENT AND PLAN: 1.  Morbid obesity  Initial weight - 203, BMI - 38.4  Per the [place], the patient is a candidate for bariatric surgery.  The patient attended our initial information session and reviewed the types of bariatric surgery.    The patient decided to go with  the Roux en Y Gastric Bypass.  The potential risks of surgery include, but are not limited to, bleeding, infection, leak from the bowel, DVT and PE, open surgery, long term nutrition consequences, and death.  The patient understands the importance of compliance and long term follow-up with our group after surgery.  She has a good understanding of the operation.  I gave her the bowel prep (Golytely) and Oxycodone elixir.  She is scheduled for 10/04/2013.  2.  Hyperlipidemia  But has not been able to tolerate statins. 3.  Diabetes mellitus 4.  Hypertension 5.  OSA - on CPAP  Since 2001, but cannot tolerate CPAP. 6.  History of endometrial cancer 7.  CAD   Chief Complaint  Patient presents with  . Bariatric Pre-op    rynygb   REFERRING PHYSICIAN: BURNETT,BRENT A, MD  HISTORY OF PRESENT ILLNESS: Sandra [name] is a 53 y.o. (DOB: [birth year])  white  female whose primary care physician is BURNETT,BRENT A, MD and comes to me today for discussion of bariatric surgery. She comes by herself. She has decided on the gastric bypass.  Last time I saw her, she was undecided. She saw Dr. Ardath Sax several times who working on her emotional eating.  UGI - 06/04/2013 - Normal US abdomen - 06/05/2103 - negative Psych - Dr. Ardath Sax -  Approved 06/11/2013  History of weight issues: The patient heard Dr. Madilyn Hook at the information session. She is undecided between a Roux-en-Y gastric bypass and a sleeve gastrectomy as to which operation she wants.  She has been overweight much of her adult life.  She will loose 20 to 30 pounds, only to regain it back.  She  has a tendency to binge eat.  She's tried about every diet. These include: Weight Watchers, Atkins, low-calorie, TOPS, and Slim fast.  She has tried multiple non prescription meds for weight loss - Dexitrim, Garcened, and Green Coffee Bean.  He has at least 3 coworkers who have had weight loss surgery. One has had a Roux Y gastric bypass and 2 have had sleedve gastrectomies.  So she has some insight into the surgery and its postoperative course.   Past Medical History  Diagnosis Date  . Hyperlipidemia   . Diabetes mellitus   . Hypertension   . Adenomatous polyp 2011    BENIGN  . OSA on CPAP   . History of endometrial cancer 2006 ---- S/P ABD. HYSTERECTOMY    STAGE I ENDOMETROID CARCINOMA  . CAD (coronary artery disease) NON-OBSTRUCTIVE PER DR HOCHREIN NOTE 08-26-2011  . Complication of anesthesia PT STATES HAS LETTER FROM [place] [facility]. STATING THAT SHE HAS A VERY SMALL AIRWAY (LAST SURG. HYSTERECTOMY AT [facility] IN 2006    PT STATES LYNN BEASON CRNA WHOM WORKS HERE TOLD SHE WOULD BE OKAY TO BE DONE AT [facility]  . Cancer   . Heart murmur       Past Surgical History  Procedure Laterality Date  . Cesarean section  1989  . Abdominal hysterectomy  12-20-2004    TAH  . Bilateral breast reduction  11-16-1999  .  Uvulopalatopharyngoplasty, tonsillectomy and septoplasty  1994  . Cystoscopy  09/06/2011    Procedure: CYSTOSCOPY;  Surgeon: Ailene Rud, MD;  [facility];  Service: Urology;  Laterality: N/A;  LYNX SLING   . Pubovaginal sling  09/06/2011    Procedure: Gaynelle Arabian;  Surgeon: Ailene Rud, MD;  [facility];  Service: Urology;  Laterality: N/A;  . Hemorrhoid banding  02/2013  . Breath tek h pylori N/A 05/27/2013    Procedure: BREATH TEK H PYLORI;  Surgeon: Shann Medal, MD;  Location: [facility];  Service: General;  Laterality: N/A;      Current Outpatient Prescriptions  Medication Sig Dispense Refill  .  benazepril (LOTENSIN) 40 MG tablet Take 40 mg by mouth every evening.      . cholecalciferol (VITAMIN D) 1000 UNITS tablet Take 2,000 Units by mouth 2 (two) times daily.      Marland Kitchen ibuprofen (ADVIL,MOTRIN) 200 MG tablet Take 800 mg by mouth every 6 (six) hours as needed for mild pain or moderate pain.      . Linaclotide (LINZESS) 145 MCG CAPS capsule Take 145 mcg by mouth every other day.      . metFORMIN (GLUCOPHAGE) 1000 MG tablet Take 1,000 mg by mouth 2 (two) times daily with a meal.      . sitaGLIPtin (JANUVIA) 100 MG tablet Take 100 mg by mouth daily.        No current facility-administered medications for this visit.     No Known Allergies  REVIEW OF SYSTEMS: Skin:  No history of rash.  No history of abnormal moles. Infection:  No history of hepatitis or HIV.  No history of MRSA. Neurologic:  No history of stroke.  No history of seizure.  No history of headaches. Cardiac:  Hypertension x 25 years.  She had chest pain in 07/2011 which prompted an evaluation by Dr. Burt Knack which was neg.  Her follow up with him was PRN. Pulmonary:  OSA since 2001.  But cannot tolerate CPAP, so she does not use it most of the time. Breasts:  Had bilateral breast reduction in 2001 by Dr. Sycamore Nation.  Endocrine:  Diabetes mellitus x 10 years.  No thyroid disease. Gastrointestinal:  Had neg upper endo about 2008 by Dr. Collene Mares.  She was told that she had increasing LFT's on recent labs.  No history of gall bladder disease.  No history of pancreas disease.  Had neg colonoscopy by Dr. Collene Mares 2010. Urologic:  No history of kidney stones.  No history of bladder infections. GYN:  Hysterectomy - 2006 - for endometrial cancer by Dr. Hebert Soho. Musculoskeletal:  No history of joint or back disease. Hematologic:  No bleeding disorder.  No history of anemia.  Not anticoagulated. Psycho-social:  Saw Dr. Ardath Sax preop.  SOCIAL and FAMILY HISTORY: Married. Works at the [employer]. Has two children: Son and daughter.     PHYSICAL EXAM: BP 120/76  Pulse 82  Temp(Src) 97.6 F (36.4 C) (Temporal)  Resp 16  Ht 5\' 1"  (1.549 m)  Wt 196 lb 9.6 oz (89.177 kg)  BMI 37.17 kg/m2  General: Obese WF who is alert and generally healthy appearing.  HEENT: Normal. Pupils equal. Neck: Supple. No mass.  No thyroid mass. Lymph Nodes:  No supraclavicular or cervical nodes. Lungs: Clear to auscultation and symmetric breath sounds. Heart:  RRR. No murmur or rub. Abdomen: Soft. No mass. No tenderness. No hernia. Normal bowel sounds.  Has diastasis.  She is about half apple and half pear. Rectal: Not done. Extremities:  Good strength and ROM  in upper and lower extremities. Neurologic:  Grossly intact to motor and sensory function. Psychiatric: Has normal mood and affect. Behavior is normal.   DATA REVIEWED: Epic notes.  Alphonsa Overall, MD,  [facility], [address]  [place], [place]    [place] Phone:  [phone] FAX:  [phone]

## 2013-10-04 NOTE — Op Note (Signed)
Sandra [name] [id number] 02/23/1961 10/04/2013  Preoperative diagnosis: obesity  Postoperative diagnosis: Same   Procedure: Upper endoscopy   Surgeon: Leighton Ruff. Niobe Dick M.D., FACS   Anesthesia: Gen.   Indications for procedure: 53 yo WF undergoing a laparoscopic roux en y gastric bypass and an upper endoscopy was requested to evaluate the anastomosis.  Description of procedure: After we have completed the new gastrojejunostomy, I scrubbed out and obtained the Olympus endoscope. I gently placed endoscope in the patient's oropharynx and gently glided it down the esophagus without any difficulty under direct visualization. Once I was in the gastric pouch, I insufflated the pouch was air. The pouch was approximately 4.5 cm in size. I was able to cannulate and advanced the scope through the gastrojejunostomy. Dr.Newman had placed saline in the upper abdomen. Upon further insufflation of the gastric pouch there were evidence of bubbles on the left lateral side of the G-J anastomosis from what appeared to be suture site in the bowel. Upon further inspection of the gastric pouch, the mucosa appeared normal. There is no evidence of any mucosal abnormality. The gastric pouch and Roux limb were decompressed. I scrubbed back in and assisted Dr Lucia Gaskins with placing an additional interrupted stitch at the end of the anastomosis on the left. I scrubbed and re-advanced the endoscope into the pouch and distended it with air. There were no bubbles. The width of the gastrojejunal anastomosis was at least 2 cm. The scope was withdrawn. The patient tolerated this portion of the procedure well. Please see Dr Lucia Gaskins operative note for details regarding the laparoscopic roux-en-y gastric bypass.  Leighton Ruff. Redmond Pulling, MD, FACS General, Bariatric, & Minimally Invasive Surgery [facility], [place]

## 2013-10-04 NOTE — Op Note (Signed)
PATIENT:   Sandra [name] DOB:   [birth year] MRN:   [id number]  DATE OF PROCEDURE: 10/04/2013                   FACILITY:  [facility]  OPERATIVE REPORT  PREOPERATIVE DIAGNOSIS:  Morbid obesity.  POSTOPERATIVE DIAGNOSIS:  Morbid obesity (weight 203, BMI of 38.4).  PROCEDURE:  Laparoscopic Roux-en-Y gastric bypass (intraoperative upper endoscopy by Sandra [name])  SURGEON:  Sandra [name]. Sandra Gaskins, MD  FIRST ASSISTANT:  Sandra [name]  ANESTHESIA:  General endotracheal.  Anesthesiologist: Sandra Najjar, MD CRNA: Sandra Buff, CRNA; Sandra Lain, CRNA  General  ESTIMATED BLOOD LOSS:  Minimal.  LOCAL ANESTHESIA:  30 cc of 3/7% Marcaine  COMPLICATIONS:  None.  INDICATION FOR SURGERY:  Sandra [name] is [name] 53 y.o. white  female who sees Sandra A, MD as her primary care doctor.  She has completed our preoperative bariatric program and now comes for [name] laparoscopic Roux-en-Y gastric bypass.  The indications, potential complications of surgery were explained to the patient.  Potential complications of the surgery include, but are not limited to, bleeding, infection, DVT, open surgery, and long-term nutritional consequences.  OPERATIVE NOTE:  The patient taken to room #1 at [facility] where Sandra [name] underwent [name] general endotracheal anesthetic, supervised by Anesthesiologist: Sandra Najjar, MD CRNA: Sandra Buff, CRNA; Sandra Lain, CRNA.  The patient was given 2 g of cefoxitin at the beginning of the procedure.  [name] time-out was held and surgical checklist run.  The abdomen was prepped with ChloraPrep and sterilely draped.  I accessed the abdominal cavity through the left upper quadrant using [name] 12 mm Optiview trocar.  I placed 6 additional trocars: 5 mm subxiphoid, 12 mm right subcostal, 12 mm right paramedian, 12 mm left paramedian, 5 mm lateral subcostal, and [name] 11 mm below to the right of the umbilicus.  The abdomen was insufflated and abdominal exploration carried  out.  Right and left lobes of liver unremarkable.  The stomach that I could see was unremarkable.  The patient had [name] moderate amount of greater omentum which draped over the bowel.  The omentum was stuck to the lower anterior peritoneal wall.  I spent 15 minutes taking the omentum down.  She did have [name] diastasis on her lower abdomen, but I don't see [name] definite hernia.  I was able to push the omentum and transverse colon up and identified the ligament of Treitz to start the operation.  I measured 40 cm of the jejunum, starting at the ligament of Tritz, and divided the jejunum with [name] white load of 45 mm Ethicon Endo-GIA stapler.  I divided [name] short length into the mesentery.  I measured 100 cm of jejunum for the future gastric limb.  I put [name] Penrose drain on the future gastric limb of the jejunum.  I then did [name] side-to-side jejunojejunostomy.  I used [name] 45 mm white load of the Ethicon Endo-GIA stapler.  I closed the enterotomy with 2 running 2-0 Vicryl sutures.  I tested the JJ anastomosis with an alligator forceps and then covered this with Tisseel.  I closed the mesenteric defect with [name] running 2-0 silk suture with [name] Laparo-tye on each end.  I then divided the omentum with [name] Harmonic Scalpel.  I positioned the patient in reverse Trendelenburg and placed the liver retractor, which was introduced into the peritoneal cavity through [name] subxiphoid 5 mm trocar puncture, under the left lobe of the  liver.  I then identified the gastroesophageal junction.  I went to the left at the angle of His and made [name] window at the left side of the esophago-gastric junction for [name] target as my dissection.  I then went on the lesser curve of the stomach, measured 5 cm from the gastroesophageal junction down the lesser curve and dissected into the lesser sac from the lesser curvature side of the stomach.  I did the first firing of [name] 45 mm blue load Ethicon Endo-GIA stapler and then did 3 firings of the 60 mm blue load Ethicon  Eschelon stapler.  I had to do one more firing of the 45 mm blue load of the Ethicon stapler.  This created [name] gastric pouch approximately 5 cm in length and 3 cm in width.  There was no bleeding from either the pouch or the stomach remnant site.  I placed Tisseel on the pouch side along the new greater curvature.  I over sewed the gastric remnant with [name] locking 2-0 Vicryl suture with [name] Laparo-tye on each end.  I then brought the jejunum ante-colic, ante-gastric up to the new stomach pouch and placed [name] posterior running 2-0 Vicryl suture.  I then made an enterotomy into the stomach using the Ewald as [name] back stop and an enterotomy into the jejunum.  I did [name] stapled side-to-side gastrojejunal anastomosis using these two enterotomies with [name] 45 mm blue load of the Ethicon Endo GIA stapler.  I tried to create [name] 2.5 cm gastrojejunal anastomosis.  I closed the enterotomy with [name] 2 running 2-0 Vicryl sutures.  I passed the Ewald tube through the gastrojejunal anastomosis and then did an anterior Connell suture running of 2-0 Vicryl suture for the anterior layer of the gastrojejunostomy.  The Ewald tube was then removed without difficulty.  I then closed the [place] defect with [name] figure-of-eight 2-0 silk suture between the mesentery of the transverse colon and the mesentery of the distal jejunum.  Dr. Redmond [name] then scrubbed out and did an intraoperative upper endoscopy.  He identified the esophagogastric junction about 40 cm, the gastrojejunal anastomosis about 45 cm.  I clamped off the small bowel.  He insufflated air and I flooded the abdomen with saline. At one point there was bubbling at the left lateral edge of the gastrojejunostomy.  I put one additional suture along the anastomosis at the left edge.  He rescoped the patient and there was no bubbling.  He then withdrew the scope and he will dictate that portion of the operation.    I then re-inspected the anastomoses, sucked out the saline, placed Tisseel over  the stomach pouch and gastrojejunal anastomosis.   The liver retractor was removed.  The trocars were removed.  There was no bleeding at any trocar site.  The skin at each trocar site was closed with [name] 5-0 Monocryl suture.  I infiltrated [name] total about 30 cc of 0.25% Marcaine at the trocar sites.    After the skin incisions were closed with sutures they were painted with Dermabond.  The sponge and needle count were correct at the end of the case.  The patient tolerated the procedure well, was transported to the recovery room in good condition.   Alphonsa Overall, MD, [facility] Pager: [phone] Office phone:  [phone]

## 2013-10-04 NOTE — Anesthesia Postprocedure Evaluation (Signed)
  Anesthesia Post-op Note  Patient: Sandra [name]  Procedure(s) Performed: Procedure(s) (LRB): LAPAROSCOPIC ROUX-EN-Y GASTRIC BYPASS WITH UPPER ENDOSCOPY (N/A)  Patient Location: PACU  Anesthesia Type: General  Level of Consciousness: awake and alert   Airway and Oxygen Therapy: Patient Spontanous Breathing  Post-op Pain: mild  Post-op Assessment: Post-op Vital signs reviewed, Patient's Cardiovascular Status Stable, Respiratory Function Stable, Patent Airway and No signs of Nausea or vomiting  Last Vitals:  Filed Vitals:   10/04/13 1200  BP: 134/86  Pulse: 85  Temp:   Resp: 13    Post-op Vital Signs: stable   Complications: No apparent anesthesia complications

## 2013-10-05 ENCOUNTER — Inpatient Hospital Stay (HOSPITAL_COMMUNITY): Payer: 59

## 2013-10-05 ENCOUNTER — Encounter (HOSPITAL_COMMUNITY): Payer: Self-pay | Admitting: Surgery

## 2013-10-05 DIAGNOSIS — Z09 Encounter for follow-up examination after completed treatment for conditions other than malignant neoplasm: Secondary | ICD-10-CM

## 2013-10-05 LAB — CBC WITH DIFFERENTIAL/PLATELET
Basophils Absolute: 0 10*3/uL (ref 0.0–0.1)
Basophils Relative: 0 % (ref 0–1)
Eosinophils Absolute: 0 10*3/uL (ref 0.0–0.7)
Eosinophils Relative: 0 % (ref 0–5)
HCT: 36.9 % (ref 36.0–46.0)
Hemoglobin: 12.3 g/dL (ref 12.0–15.0)
Lymphocytes Relative: 28 % (ref 12–46)
Lymphs Abs: 3.2 10*3/uL (ref 0.7–4.0)
MCH: 30.5 pg (ref 26.0–34.0)
MCHC: 33.3 g/dL (ref 30.0–36.0)
MCV: 91.6 fL (ref 78.0–100.0)
Monocytes Absolute: 1 10*3/uL (ref 0.1–1.0)
Monocytes Relative: 9 % (ref 3–12)
Neutro Abs: 7.2 10*3/uL (ref 1.7–7.7)
Neutrophils Relative %: 63 % (ref 43–77)
Platelets: 259 10*3/uL (ref 150–400)
RBC: 4.03 MIL/uL (ref 3.87–5.11)
RDW: 12.8 % (ref 11.5–15.5)
WBC: 11.4 10*3/uL — ABNORMAL HIGH (ref 4.0–10.5)

## 2013-10-05 LAB — GLUCOSE, CAPILLARY
Glucose-Capillary: 100 mg/dL — ABNORMAL HIGH (ref 70–99)
Glucose-Capillary: 100 mg/dL — ABNORMAL HIGH (ref 70–99)
Glucose-Capillary: 106 mg/dL — ABNORMAL HIGH (ref 70–99)
Glucose-Capillary: 110 mg/dL — ABNORMAL HIGH (ref 70–99)
Glucose-Capillary: 111 mg/dL — ABNORMAL HIGH (ref 70–99)
Glucose-Capillary: 124 mg/dL — ABNORMAL HIGH (ref 70–99)

## 2013-10-05 LAB — HEMOGLOBIN AND HEMATOCRIT, BLOOD
HCT: 36.5 % (ref 36.0–46.0)
Hemoglobin: 12 g/dL (ref 12.0–15.0)

## 2013-10-05 MED ORDER — IOHEXOL 300 MG/ML  SOLN
50.0000 mL | Freq: Once | INTRAMUSCULAR | Status: AC | PRN
  Administered 2013-10-05: 50 mL via ORAL

## 2013-10-05 NOTE — Care Management Note (Signed)
    Page 1 of 1   10/05/2013     2:33:21 PM CARE MANAGEMENT NOTE 10/05/2013  Patient:  Sandra [name], Sandra [name]   Account Number:  [id number]  Date Initiated:  10/05/2013  Documentation initiated by:  Sunday Spillers  Subjective/Objective Assessment:   53 yo female admitted s/p gastric bypass. PTA lived at home with spouse.     Action/Plan:   Home when stable   Anticipated DC Date:  10/07/2013   Anticipated DC Plan:  [place]  CM consult      Choice offered to / List presented to:             Status of service:  Completed, signed off Medicare Important Message given?  NA - LOS <3 / Initial given by admissions (If response is "NO", the following Medicare IM given date fields will be blank) Date Medicare IM given:   Date Additional Medicare IM given:    Discharge Disposition:  HOME/SELF CARE  Per UR Regulation:  Reviewed for med. necessity/level of care/duration of stay  If discussed at [place] of Stay Meetings, dates discussed:    Comments:

## 2013-10-05 NOTE — Progress Notes (Signed)
Nutrition Education Note  Received consult for diet education per DROP protocol.   Discussed 2 week post op diet with pt. Emphasized that liquids must be non carbonated, non caffeinated, and sugar free. Fluid goals discussed. Pt to follow up with outpatient bariatric RD for further diet progression after 2 weeks. Multivitamins and minerals also reviewed. Teach back method used, pt expressed understanding, expect good compliance.   Diet: First 2 Weeks  You will see the nutritionist about two (2) weeks after your surgery. The nutritionist will increase the types of foods you can eat if you are handling liquids well:  If you have severe vomiting or nausea and cannot handle clear liquids lasting longer than 1 day, call your surgeon  Protein Shake  Drink at least 2 ounces of shake 5-6 times per day  Each serving of protein shakes (usually 8 - 12 ounces) should have a minimum of:  15 grams of protein  And no more than 5 grams of carbohydrate  Goal for protein each day:  Men = 80 grams per day  Women = 60 grams per day  Protein powder may be added to fluids such as non-fat milk or Lactaid milk or Soy milk (limit to 35 grams added protein powder per serving)   Hydration  Slowly increase the amount of water and other clear liquids as tolerated (See Acceptable Fluids)  Slowly increase the amount of protein shake as tolerated  Sip fluids slowly and throughout the day  May use sugar substitutes in small amounts (no more than 6 - 8 packets per day; i.e. Splenda)   Fluid Goal  The first goal is to drink at least 8 ounces of protein shake/drink per day (or as directed by the nutritionist); some examples of protein shakes are [employer], [employer], EAS Edge HP, and Unjury. See handout from pre-op Bariatric Education Class:  Slowly increase the amount of protein shake you drink as tolerated  You may find it easier to slowly sip shakes throughout the day  It is important to get your proteins  in first  Your fluid goal is to drink 64 - 100 ounces of fluid daily  It may take a few weeks to build up to this  32 oz (or more) should be clear liquids  And  32 oz (or more) should be full liquids (see below for examples)  Liquids should not contain sugar, caffeine, or carbonation   Clear Liquids:  Water or Sugar-free flavored water (i.e. Fruit H2O, Propel)  Decaffeinated coffee or tea (sugar-free)  Crystal Lite, Wyler's Lite, Minute Maid Lite  Sugar-free Jell-O  Bouillon or broth  Sugar-free Popsicle: *Less than 20 calories each; Limit 1 per day   Full Liquids:  Protein Shakes/Drinks + 2 choices per day of other full liquids  Full liquids must be:  No More Than 12 grams of Carbs per serving  No More Than 3 grams of Fat per serving  Strained low-fat cream soup  Non-Fat milk  Fat-free Lactaid Milk  Sugar-free yogurt (Dannon Lite & Fit, [place] yogurt)     [place], RD, [place] [phone] Pager [phone] Weekend/After Hours Pager

## 2013-10-05 NOTE — Progress Notes (Signed)
RT went to place patient on CPAP. Patient brought own mask and tubing. Patient has nasal pillows which will not work tonight due to having End Tidal CO2 canula. RN aware.

## 2013-10-05 NOTE — Progress Notes (Signed)
VASCULAR LAB PRELIMINARY  PRELIMINARY  PRELIMINARY  PRELIMINARY  Bilateral lower extremity venous duplex  completed.    Preliminary report:  Bilateral:  No evidence of DVT, superficial thrombosis, or Baker's Cyst.    Nani Ravens, RVT 10/05/2013, 9:49 AM

## 2013-10-05 NOTE — Progress Notes (Signed)
General Surgery Note  LOS: 1 day  POD -  1 Day Post-Op  Assessment/Plan: 1.  LAPAROSCOPIC ROUX-EN-Y GASTRIC BYPASS WITH UPPER ENDOSCOPY - 6/8/015 - D. Shauntia Levengood  Doing well  For swallow today  2. Hyperlipidemia   But has not been able to tolerate statins.  3. Diabetes mellitus   Glucose - 100 - 10/05/2013 4. Hypertension  5. OSA - on CPAP   Since 2001, but cannot tolerate CPAP.  6. History of endometrial cancer  7. CAD  8.  DVT prophylaxis - SQ Heparin  Active Problems:   Morbid obesity, weight - 203, BMI - 38.4  Subjective:  Did okay last PM.  Not nauseated. Objective:   Filed Vitals:   10/05/13 0526  BP: 165/95  Pulse: 84  Temp: 98.4 F (36.9 C)  Resp: 16     Intake/Output from previous day:  06/08 0701 - 06/09 0700 In: 5158.3 [I.V.:4933.3] Out: 2310 [Urine:2260; Blood:50]  Intake/Output this shift:      Physical Exam:   General: Obese WF who is alert and oriented.    HEENT: Normal. Pupils equal. .   Lungs: Clear.  IS at 1,200 cc.   Abdomen: Soft.  Rare BS.  Wound: Okay      Lab Results:    Recent Labs  10/04/13 1643 10/05/13 0523  WBC  --  11.4*  HGB 12.7 12.3  HCT 37.3 36.9  PLT  --  259    BMET  No results found for this basename: NA, K, CL, CO2, GLUCOSE, BUN, CREATININE, CALCIUM,  in the last 72 hours  PT/INR  No results found for this basename: LABPROT, INR,  in the last 72 hours  ABG  No results found for this basename: PHART, PCO2, PO2, HCO3,  in the last 72 hours   Studies/Results:  No results found.   Anti-infectives:   Anti-infectives   Start     Dose/Rate Route Frequency Ordered Stop   10/04/13 0915  cefOXitin (MEFOXIN) 2 g in dextrose 5 % 50 mL IVPB     2 g 100 mL/hr over 30 Minutes Intravenous  Once 10/04/13 0910 10/04/13 0924   10/04/13 0535  cefOXitin (MEFOXIN) 2 g in dextrose 5 % 50 mL IVPB     2 g 100 mL/hr over 30 Minutes Intravenous On call to O.R. 10/04/13 0535 10/04/13 0723      Alphonsa Overall, MD, FACS Pager:  [facility] Office: [phone] 10/05/2013

## 2013-10-06 LAB — CBC WITH DIFFERENTIAL/PLATELET
Basophils Absolute: 0 10*3/uL (ref 0.0–0.1)
Basophils Relative: 0 % (ref 0–1)
Eosinophils Absolute: 0.1 10*3/uL (ref 0.0–0.7)
Eosinophils Relative: 1 % (ref 0–5)
HCT: 37.4 % (ref 36.0–46.0)
Hemoglobin: 12.3 g/dL (ref 12.0–15.0)
Lymphocytes Relative: 36 % (ref 12–46)
Lymphs Abs: 3.2 10*3/uL (ref 0.7–4.0)
MCH: 30.7 pg (ref 26.0–34.0)
MCHC: 32.9 g/dL (ref 30.0–36.0)
MCV: 93.3 fL (ref 78.0–100.0)
Monocytes Absolute: 0.7 10*3/uL (ref 0.1–1.0)
Monocytes Relative: 8 % (ref 3–12)
Neutro Abs: 4.9 10*3/uL (ref 1.7–7.7)
Neutrophils Relative %: 55 % (ref 43–77)
Platelets: 211 10*3/uL (ref 150–400)
RBC: 4.01 MIL/uL (ref 3.87–5.11)
RDW: 12.7 % (ref 11.5–15.5)
WBC: 8.8 10*3/uL (ref 4.0–10.5)

## 2013-10-06 LAB — GLUCOSE, CAPILLARY
Glucose-Capillary: 102 mg/dL — ABNORMAL HIGH (ref 70–99)
Glucose-Capillary: 105 mg/dL — ABNORMAL HIGH (ref 70–99)
Glucose-Capillary: 115 mg/dL — ABNORMAL HIGH (ref 70–99)
Glucose-Capillary: 116 mg/dL — ABNORMAL HIGH (ref 70–99)

## 2013-10-06 NOTE — Discharge Summary (Signed)
Physician Discharge Summary  Patient ID:  Sandra [name]  MRN: [id number]  DOB/AGE: 08/08/1960 53 y.o.  Admit date: 10/04/2013 Discharge date: 10/06/2013  Discharge Diagnoses:  1.  Morbid obesity 2.  Hyperlipidemia   But has not been able to tolerate statins.  3. Diabetes mellitus  4. Hypertension  5. OSA - on CPAP   Since 2001, but cannot tolerate CPAP.  6. History of endometrial cancer  7. CAD    Active Problems:   Morbid obesity, weight - 203, BMI - 38.4  Operation: Procedure(s): LAPAROSCOPIC ROUX-EN-Y GASTRIC BYPASS WITH UPPER ENDOSCOPY on 10/04/2013 - D. Lucia Gaskins  Discharged Condition: good  Hospital Course: Sandra [name] is an 53 y.o. female whose primary care physician is Stephens Shire, MD and who was admitted 10/04/2013 with a chief complaint of morbid obesity.   She was brought to the operating room on 10/04/2013 and underwent  LAPAROSCOPIC ROUX-EN-Y GASTRIC BYPASS WITH UPPER ENDOSCOPY.  She did well post op.  Her swallow the first post op day looked good.  The dopplers of her lower extremities were normal. On the second day post op, she was taking protein drinks.  She is ready to go home.  The discharge instructions were reviewed with the patient.  Consults: None  Significant Diagnostic Studies: Results for orders placed during the hospital encounter of 10/04/13  GLUCOSE, CAPILLARY      Result Value Ref Range   Glucose-Capillary 105 (*) 70 - 99 mg/dL  GLUCOSE, CAPILLARY      Result Value Ref Range   Glucose-Capillary 211 (*) 70 - 99 mg/dL  HEMOGLOBIN AND HEMATOCRIT, BLOOD      Result Value Ref Range   Hemoglobin 12.7  12.0 - 15.0 g/dL   HCT 37.3  36.0 - 46.0 %  GLUCOSE, CAPILLARY      Result Value Ref Range   Glucose-Capillary 184 (*) 70 - 99 mg/dL  CBC WITH DIFFERENTIAL      Result Value Ref Range   WBC 11.4 (*) 4.0 - 10.5 K/uL   RBC 4.03  3.87 - 5.11 MIL/uL   Hemoglobin 12.3  12.0 - 15.0 g/dL   HCT 36.9  36.0 - 46.0 %   MCV 91.6  78.0 - 100.0 fL   MCH 30.5   26.0 - 34.0 pg   MCHC 33.3  30.0 - 36.0 g/dL   RDW 12.8  11.5 - 15.5 %   Platelets 259  150 - 400 K/uL   Neutrophils Relative % 63  43 - 77 %   Neutro Abs 7.2  1.7 - 7.7 K/uL   Lymphocytes Relative 28  12 - 46 %   Lymphs Abs 3.2  0.7 - 4.0 K/uL   Monocytes Relative 9  3 - 12 %   Monocytes Absolute 1.0  0.1 - 1.0 K/uL   Eosinophils Relative 0  0 - 5 %   Eosinophils Absolute 0.0  0.0 - 0.7 K/uL   Basophils Relative 0  0 - 1 %   Basophils Absolute 0.0  0.0 - 0.1 K/uL  GLUCOSE, CAPILLARY      Result Value Ref Range   Glucose-Capillary 130 (*) 70 - 99 mg/dL  HEMOGLOBIN AND HEMATOCRIT, BLOOD      Result Value Ref Range   Hemoglobin 12.0  12.0 - 15.0 g/dL   HCT 36.5  36.0 - 46.0 %  GLUCOSE, CAPILLARY      Result Value Ref Range   Glucose-Capillary 100 (*) 70 - 99 mg/dL  GLUCOSE, CAPILLARY  Result Value Ref Range   Glucose-Capillary 100 (*) 70 - 99 mg/dL  GLUCOSE, CAPILLARY      Result Value Ref Range   Glucose-Capillary 124 (*) 70 - 99 mg/dL  GLUCOSE, CAPILLARY      Result Value Ref Range   Glucose-Capillary 111 (*) 70 - 99 mg/dL  GLUCOSE, CAPILLARY      Result Value Ref Range   Glucose-Capillary 110 (*) 70 - 99 mg/dL  CBC WITH DIFFERENTIAL      Result Value Ref Range   WBC 8.8  4.0 - 10.5 K/uL   RBC 4.01  3.87 - 5.11 MIL/uL   Hemoglobin 12.3  12.0 - 15.0 g/dL   HCT 37.4  36.0 - 46.0 %   MCV 93.3  78.0 - 100.0 fL   MCH 30.7  26.0 - 34.0 pg   MCHC 32.9  30.0 - 36.0 g/dL   RDW 12.7  11.5 - 15.5 %   Platelets 211  150 - 400 K/uL   Neutrophils Relative % 55  43 - 77 %   Neutro Abs 4.9  1.7 - 7.7 K/uL   Lymphocytes Relative 36  12 - 46 %   Lymphs Abs 3.2  0.7 - 4.0 K/uL   Monocytes Relative 8  3 - 12 %   Monocytes Absolute 0.7  0.1 - 1.0 K/uL   Eosinophils Relative 1  0 - 5 %   Eosinophils Absolute 0.1  0.0 - 0.7 K/uL   Basophils Relative 0  0 - 1 %   Basophils Absolute 0.0  0.0 - 0.1 K/uL  GLUCOSE, CAPILLARY      Result Value Ref Range   Glucose-Capillary 106 (*)  70 - 99 mg/dL  GLUCOSE, CAPILLARY      Result Value Ref Range   Glucose-Capillary 105 (*) 70 - 99 mg/dL  GLUCOSE, CAPILLARY      Result Value Ref Range   Glucose-Capillary 115 (*) 70 - 99 mg/dL  GLUCOSE, CAPILLARY      Result Value Ref Range   Glucose-Capillary 102 (*) 70 - 99 mg/dL    Dg Ugi W/water Sol Cm  10/05/2013   CLINICAL DATA:  Post gastric bypass surgery  EXAM: WATER SOLUBLE UPPER GI SERIES  TECHNIQUE: Single-column upper GI series was performed using water soluble contrast.  CONTRAST:  97mL OMNIPAQUE IOHEXOL 300 MG/ML  SOLN  COMPARISON:  06/04/2013  FLUOROSCOPY TIME:  1 min 4 seconds  FINDINGS: The esophagus shows normal contour and distensibility. The patient is status post gastric bypass surgery. There is prompt passage of the contrast from distal esophagus in a small gastric pouch. There is prompt passage of the contrast from the gastric pouch in the proximal small bowel. There is no evidence of stricture at the gastrojejunal anastomosis. No leakage or extraluminal contrast material.  IMPRESSION: Status post gastric bypass surgery. There is prompt passage of the contrast from distal esophagus in small gastric pouch and from the gastric pouch in the proximal small bowel. No evidence of stricture. No contrast extravasation. The visualized proximal small bowel is unremarkable.   Electronically Signed   By: Lahoma Crocker M.D.   On: 10/05/2013 08:59    Discharge Exam:  Filed Vitals:   10/06/13 0517  BP: 146/84  Pulse: 80  Temp: 98.7 F (37.1 C)  Resp: 16    General: Obese WF who is alert and generally healthy appearing.  Lungs: Clear to auscultation and symmetric breath sounds. Heart:  RRR. No murmur or rub. Abdomen: Soft. Incisions  look good.  Normal bowel sounds.   Discharge Medications:     Medication List    STOP taking these medications       ibuprofen 200 MG tablet  Commonly known as:  ADVIL,MOTRIN      TAKE these medications       benazepril 40 MG tablet   Commonly known as:  LOTENSIN  Take 40 mg by mouth every evening.     cholecalciferol 1000 UNITS tablet  Commonly known as:  VITAMIN D  Take 2,000 Units by mouth 2 (two) times daily.     LINZESS 145 MCG Caps capsule  Generic drug:  Linaclotide  Take 145 mcg by mouth every other day.     metFORMIN 1000 MG tablet  Commonly known as:  GLUCOPHAGE  Take 1,000 mg by mouth 2 (two) times daily with a meal.     sitaGLIPtin 100 MG tablet  Commonly known as:  JANUVIA  Take 100 mg by mouth daily.        Disposition: 01-Home or Self Care      Discharge Instructions   Increase activity slowly    Complete by:  As directed            Activity:  Driving - May drive in 3 or 4 days, if doing well   Lifting - Take it easy for 1 week, then no limit.  Wound Care:   May shower starting tonight.  Diet:  Post op gastric bypass diet.  Follow up appointment:  Call Dr. Pollie Friar office [facility]) at [phone] for an appointment in 2 weeks.          Make an appt with Dr. Tollie Pizza to review meds in 3 to 4 weeks.  Signed: Alphonsa Overall, M.D., [facility] Office:  [phone]  10/06/2013, 8:32 AM

## 2013-10-06 NOTE — Discharge Instructions (Addendum)
[facility] - DISCHARGE INSTRUCTIONS TO PATIENT  Activity:  Driving - May drive in 3 or 4 days, if doing well   Lifting - Take it easy for 1 week, then no limit.  Wound Care:   May shower starting tonight.  Diet:  Post op gastric bypass diet.  Follow up appointment:  Call Dr. Pollie Friar office [facility]) at [phone] for an appointment in 2 weeks.          Make an appt with Dr. Tollie Pizza to review meds in 3 to 4 weeks.  Medications and dosages:  Resume your home medications.  You have a prescription for:  Oxycodone elixir (this has been given to you)             Check your blood sugars regularly and modify you diabetes medicines as necessary.              Call Dr. Lucia Gaskins or his office  [phone]) if you have:  Temperature greater than 100.4,  Persistent nausea and vomiting,  Severe uncontrolled pain,  Redness, tenderness, or signs of infection (pain, swelling, redness, odor or green/yellow discharge around the site),  Difficulty breathing, headache or visual disturbances,  Any other questions or concerns you may have after discharge.  In an emergency, call 911 or go to an Emergency Department at a nearby hospital.       GASTRIC BYPASS/SLEEVE  Home Care Instructions   These instructions are to help you care for yourself when you go home.  Call: If you have any problems.   Call [phone] and ask for the surgeon on call   If you need immediate assistance come to the ER at [facility]. Tell the ER staff you are a new post-op gastric bypass or gastric sleeve patient  Signs and symptoms to report:   Severe  vomiting or nausea o If you cannot handle clear liquids for longer than 1 day, call your surgeon   Abdominal pain which does not get better after taking your pain medication   Fever greater than 100.4  F and chills   Heart rate over 100 beats a minute   Trouble breathing   Chest pain   Redness,  swelling, drainage, or foul odor at incision  (surgical) sites   If your incisions open or pull apart   Swelling or pain in calf (lower leg)   Diarrhea (Loose bowel movements that happen often), frequent watery, uncontrolled bowel movements   Constipation, (no bowel movements for 3 days) if this happens: o Take Milk of Magnesia, 2 tablespoons by mouth, 3 times a day for 2 days if needed o Stop taking Milk of Magnesia once you have had a bowel movement o Call your doctor if constipation continues Or o Take Miralax  (instead of Milk of Magnesia) following the label instructions o Stop taking Miralax once you have had a bowel movement o Call your doctor if constipation continues   Anything you think is abnormal for you   Normal side effects after surgery:   Unable to sleep at night or unable to concentrate   Irritability   Being tearful (crying) or depressed  These are common complaints, possibly related to your anesthesia, stress of surgery, and change in lifestyle, that usually go away a few weeks after surgery. If these feelings continue, call your medical doctor.  Wound Care: You may have surgical glue, steri-strips, or staples over your incisions after surgery   Surgical glue: Looks like clear film over  your incisions and will wear off a little at a time   Steri-strips: Adhesive strips of tape over your incisions. You may notice a yellowish color on skin under the steri-strips. This is used to make the steri-strips stick better. Do not pull the steri-strips off - let them fall off   Staples: Staples may be removed before you leave the hospital o If you go home with staples, call [facility] for an appointment with your surgeons nurse to have staples removed 10 days after surgery, [phone]   Showering: You may shower two (2) days after your surgery unless your surgeon tells you differently o Wash gently around incisions with warm soapy water, rinse well, and gently pat dry o If you have a drain (tube from your  incision), you may need someone to hold this while you shower o No tub baths until staples are removed and incisions are healed   Medications:   Medications should be liquid or crushed if larger than the size of a dime   Extended release pills (medication that releases a little bit at a time through the  day) should not be crushed   Depending on the size and number of medications you take, you may need to space (take a few throughout the day)/change the time you take your medications so that you do not over-fill your pouch (smaller stomach)   Make sure you follow-up with you primary care physician to make medication changes needed during rapid weight loss and life -style changes   If you have diabetes, follow up with your doctor that orders your diabetes medication(s) within one week after surgery and check your blood sugar regularly    Do not drive while taking narcotics (pain medications)    Do not take acetaminophen (Tylenol) and Roxicet or Lortab Elixir at the same time since these pain medications contain acetaminophen   Diet:  First 2 Weeks You will see the nutritionist about two (2) weeks after your surgery. The nutritionist will increase the types of foods you can eat if you are handling liquids well:   If you have severe vomiting or nausea and cannot handle clear liquids lasting longer than 1 day call your surgeon Protein Shake   Drink at least 2 ounces of shake 5-6 times per day   Each serving of protein shakes (usually 8-12 ounces) should have a minimum of: o 15 grams of protein o And no more than 5 grams of carbohydrate   Goal for protein each day: o Men = 80 grams per day o Women = 60 grams per day      Protein powder may be added to fluids such as non-fat milk or Lactaid milk or Soy milk (limit to 35 grams added protein powder per serving)  Hydration   Slowly increase the amount of water and other clear liquids as tolerated (See Acceptable Fluids)   Slowly increase the  amount of protein shake as tolerated   Sip fluids slowly and throughout the day   May use sugar substitutes in small amounts (no more than 6-8 packets per day; i.e. Splenda)  Fluid Goal   The first goal is to drink at least 8 ounces of protein shake/drink per day (or as directed by the nutritionist); some examples of protein shakes are [employer], [employer]R [employer], EAS Edge HP, and Unjury. - See handout from pre-op Bariatric Education Class: o Slowly increase the amount of protein shake you drink as tolerated o You may find  it easier to slowly sip shakes throughout the day o It is important to get your proteins in first   Your fluid goal is to drink 64-100 ounces of fluid daily o It may take a few weeks to build up to this    32 oz. (or more) should be clear liquids And   32 oz. (or more) should be full liquids (see below for examples)   Liquids should not contain sugar, caffeine, or carbonation  Clear Liquids:   Water of Sugar-free flavored water (i.e. Fruit HO, Propel)   Decaffeinated coffee or tea (sugar-free)   Crystal lite, Wylers Lite, Minute Maid Lite   Sugar-free Jell-O   Bouillon or broth   Sugar-free Popsicle:    - Less than 20 calories each; Limit 1 per day  Full Liquids:                   Protein Shakes/Drinks + 2 choices per day of other full liquids   Full liquids must be: o No More Than 12 grams of Carbs per serving o No More Than 3 grams of Fat per serving   Strained low-fat cream soup   Non-Fat milk   Fat-free Lactaid Milk   Sugar-free yogurt (Dannon Lite & Fit, Greek yogurt)    Vitamins and Minerals   Start 1 day after surgery unless otherwise directed by your surgeon   2 Chewable Multivitamin / Multimineral Supplement with iron (i.e. Centrum for Adults)   Vitamin B-12, 350-500 micrograms sub-lingual (place tablet under the tongue) each day   Chewable Calcium Citrate with Vitamin D-3 (Example: 3 Chewable Calcium  Plus 600 with Vitamin D-3) o Take 500 mg  three (3) times a day for a total of 1500 mg each day o Do not take all 3 doses of calcium at one time as it may cause constipation, and you can only absorb 500 mg at a time o Do not mix multivitamins containing iron with calcium supplements;  take 2 hours apart o Do not substitute Tums (calcium carbonate) for your calcium   Menstruating women and those at risk for anemia ( a blood disease that causes weakness) may need extra iron o Talk to your doctor to see if you need more iron   If you need extra iron: Total daily Iron recommendation (including Vitamins) is 50 to 100 mg Iron/day   Do not stop taking or change any vitamins or minerals until you talk to your nutritionist or surgeon   Your nutritionist and/or surgeon must approve all vitamin and mineral supplements   Activity and Exercise: It is important to continue walking at home. Limit your physical activity as instructed by your doctor. During this time, use these guidelines:   Do not lift anything greater than ten  (10) pounds for at least two (2) weeks   Do not go back to work or drive until Engineer, production says you can   You may have sex when you feel comfortable o It is VERY important for female patients to use a reliable birth control method; fertility often increase after surgery o Do not get pregnant for at least 18 months   Start exercising as soon as your doctor tells you that you can o Make sure your doctor approves any physical activity   Start with a simple walking program   Walk 5-15 minutes each day, 7 days per week   Slowly increase until you are walking 30-45 minutes per day   Consider joining our  BELT program. [phone] or email [email or web address]   Special Instructions Things to remember:   Free counseling is available for you and your family through collaboration between [facility] and [facility]. Please call [phone] and leave a message   Use your CPAP when sleeping if this applies to you   Consider buying a medical  alert bracelet that says you had lap-band surgery     You will likely have your first fill (fluid added to your band) 6 - 8 weeks after surgery   [facility] has a free Bariatric Surgery Support Group that meets monthly, the 3rd Thursday, [place]. You can see classes online at [email or web address]   It is very important to keep all follow up appointments with your surgeon, nutritionist, primary care physician, and behavioral health practitioner o After the first year, please follow up with your bariatric surgeon and nutritionist at least once a year in order to maintain best weight loss results                    [facility]:  [place]: [phone]               Bariatric Nurse Coordinator: [phone]  Gastric Bypass/Sleeve Home Care Instructions  Rev. 05/2012                                                         Reviewed and Endorsed                                                    by [facility] Patient Education Committee, Jan, 2014

## 2013-10-06 NOTE — Progress Notes (Signed)
This RT unaware of rx for RT consult for cpap.  Spoke with pt regarding cpap/bipap use at home.  Pt stated she wears cpap at home at 16cm h2o with nasal pillows.  Pt is currently using the etco2/canula and does not want to wear cpap tonight.  RN aware.

## 2013-10-08 ENCOUNTER — Telehealth (HOSPITAL_COMMUNITY): Payer: Self-pay

## 2013-10-08 NOTE — Progress Notes (Signed)
*  Late Entry* Patient alert and oriented, pain is controlled. Patient is tolerating fluids, advanced to protein shake today. Reviewed Gastric Bypass discharge instructions with patient and patient is able to articulate understanding. Provided information on BELT program, Support Group and [facility] outpatient pharmacy. All questions answered, will continue to monitor.

## 2013-10-08 NOTE — Telephone Encounter (Signed)
Made discharge phone call to patient per DROP protocol. Asking the following questions.    1. Do you have someone to care for you now that you are home?  yes 2. Are you having pain now that is not relieved by your pain medication?  no 3. Are you able to drink the recommended daily amount of fluids (48 ounces minimum/day) and protein (60-80 grams/day) as prescribed by the dietitian or nutritional counselor?  Working on it 1 shake and about 32 oz water so far today  4. Are you taking the vitamins and minerals as prescribed?  yes 5. Do you have the "on call" number to contact your surgeon if you have a problem or question?  yes 6. Are your incisions free of redness, swelling or drainage? (If steri strips, address that these can fall off, shower as tolerated) yes 7. Have your bowels moved since your surgery?  If not, are you passing gas?  Yes/yes 8. Are you up and walking 3-4 times per day?  yes 9. Do you have an appointment to see a dietitian or nutritional counselor in the next month?  yes  The following questions can be added at the center's discharge phone call script at the center's discretion.  The questions are also captured within [employer] project custom fields. 1. Do you have an appointment made to see your surgeon in the next month?  yes 2. Were you provided your discharge medications before your surgery or before you were discharged from the hospital and are you taking them without problem?  yes 3. Were you provided phone numbers to the clinic/surgeon's office?  yes 4. Did you watch the patient education video module in the (clinic, surgeon's office, etc.) before your surgery? yes 5. Do you have a discharge checklist that was provided to you in the hospital to reference with instructions on how to take care of yourself after surgery?  yes 6. Did you see a dietitian or nutritional counselor while you were in the hospital?  yes

## 2013-10-11 ENCOUNTER — Telehealth (INDEPENDENT_AMBULATORY_CARE_PROVIDER_SITE_OTHER): Payer: Self-pay

## 2013-10-11 NOTE — Telephone Encounter (Signed)
States she took crush  metformin and it felt as it got stuck anyway , she now has reflux with anything she eats or drinks. Advised her to go to soft diet and I will call her with Dr. Lucia Gaskins recommendations.

## 2013-10-11 NOTE — Telephone Encounter (Signed)
I talked to [place].  I does sound like the metformin caused some sort of esophageal  irritation when she took it Saturday, 6/13.  She is a little better today.  I recommended that she not take any unnecessary meds (vitamins, etc.) for 3 to 5 days.  I suggested that she ake some antiacid, such as Maalox, 3 to 4 times per day.  She will call us back if she is not better in 3 or 4 days.  Alphonsa Overall, MD, [facility] Pager: [phone] Office phone:  [phone]

## 2013-10-19 ENCOUNTER — Encounter: Payer: 59 | Attending: Surgery

## 2013-10-19 DIAGNOSIS — E669 Obesity, unspecified: Secondary | ICD-10-CM | POA: Insufficient documentation

## 2013-10-19 DIAGNOSIS — Z713 Dietary counseling and surveillance: Secondary | ICD-10-CM | POA: Insufficient documentation

## 2013-10-19 DIAGNOSIS — Z01818 Encounter for other preprocedural examination: Secondary | ICD-10-CM | POA: Insufficient documentation

## 2013-10-19 NOTE — Progress Notes (Signed)
Bariatric Class:  Appt start time: 1530 end time:  1630.  2 Week Post-Operative Nutrition Class  Patient was seen on 10/19/2013 for Post-[facility]ative [facility].  Surgery date: 10/04/2013 Surgery type: RYGB Start weight at [facility]: 196 on 06/11/2013 Weight today: 196.5 lbs Weight change: 26.5 lbs  TANITA  BODY COMP RESULTS  09/06/13 10/19/2013   BMI (kg/m^2) 37.1 32.1   Fat Mass (lbs) 83.5 65.5   Fat Free Mass (lbs) 113 104.5   Total Body Water (lbs) 82.5 76.5    The following the learning objectives were met by the patient during this course:  Identifies Phase 3A (Soft, High Proteins) Dietary Goals and will begin from 2 weeks post-operatively to 2 months post-operatively  Identifies appropriate sources of fluids and proteins   States protein recommendations and appropriate sources post-operatively  Identifies the need for appropriate texture modifications, mastication, and bite sizes when consuming solids  Identifies appropriate multivitamin and calcium sources post-operatively  Describes the need for physical activity post-operatively and will follow MD recommendations  States when to call healthcare provider regarding medication questions or post-operative complications  Handouts given during class include:  Phase 3A: Soft, High Protein Diet Handout  Band Fill Guidelines Handout  Follow-Up Plan: Patient will follow-up at [facility] in 4 weeks for 6 week post-op nutrition visit for diet advancement per MD.

## 2013-10-20 ENCOUNTER — Other Ambulatory Visit (INDEPENDENT_AMBULATORY_CARE_PROVIDER_SITE_OTHER): Payer: Self-pay

## 2013-10-20 ENCOUNTER — Encounter (INDEPENDENT_AMBULATORY_CARE_PROVIDER_SITE_OTHER): Payer: Self-pay | Admitting: Surgery

## 2013-10-20 ENCOUNTER — Ambulatory Visit (INDEPENDENT_AMBULATORY_CARE_PROVIDER_SITE_OTHER): Payer: Commercial Managed Care - PPO | Admitting: Surgery

## 2013-10-20 DIAGNOSIS — Z9884 Bariatric surgery status: Secondary | ICD-10-CM | POA: Insufficient documentation

## 2013-10-20 NOTE — Progress Notes (Signed)
Re:   Sandra [name] DOB:   [birth year] MRN:   [id number]  ASSESSMENT AND PLAN: 1.  RYGB ono 10/04/2013.  Morbid obesity.  Initial weight - 203, BMI - 38.4  She has some mild nausea, but is otherwise doing well.    I wrote her a note to be out of work until 12/06/2013 - this was pre-arranged with her supervisor.  I will see her back in 3 months with lab check.   2.  Hyperlipidemia  But has not been able to tolerate statins. 3.  Diabetes mellitus  She is off her meds and her blood sugars have been below 120. 4.  Hypertension 5.  OSA - on CPAP  Since 2001, but cannot tolerate CPAP. 6.  History of endometrial cancer 7.  CAD  8.  Metformin caused esophageal irritation - improving  Chief Complaint  Patient presents with  . Bariatric Follow Up   REFERRING PHYSICIAN: BURNETT,BRENT A, MD  HISTORY OF PRESENT ILLNESS: Sandra [name] is a 53 y.o. (DOB: 08/14/1960)  white  female whose primary care physician is BURNETT,BRENT A, MD and comes to me today for post op RYGB. She comes with her daughter, Apolonio Schneiders She had some trouble with metoformin irritating her esophagus, so she has stopped her diabetes meds.  The esophageal irritation is better. She has had some mild nausea, but is not vomiting.  Water particularly makes this worse.  Unclear why. She also has some constipation - but she knows she needs to get in more liquids. Overall, she is doing well. I have encouraged her to see Dr. Tollie Pizza.  History of weight issues: The patient heard Dr. Madilyn Hook at the information session. She is undecided between a Roux-en-Y gastric bypass and a sleeve gastrectomy as to which operation she wants.  She has been overweight much of her adult life.  She will loose 20 to 30 pounds, only to regain it back.  She has a tendency to binge eat.  She's tried about every diet. These include: Weight Watchers, Atkins, low-calorie, TOPS, and Slim fast.  She has tried multiple non prescription meds for weight loss -  Dexitrim, Garcened, and Green Coffee Bean.  He has at least 3 coworkers who have had weight loss surgery. One has had a Roux Y gastric bypass and 2 have had sleedve gastrectomies.  So she has some insight into the surgery and its postoperative course.   Past Medical History  Diagnosis Date  . Hyperlipidemia   . Diabetes mellitus   . Hypertension   . Adenomatous polyp 2011    BENIGN  . History of endometrial cancer 2006 ---- S/P ABD. HYSTERECTOMY    STAGE I ENDOMETROID CARCINOMA  . CAD (coronary artery disease) NON-OBSTRUCTIVE PER DR HOCHREIN NOTE 08-26-2011  . Heart murmur   . OSA on CPAP     cpap setting of 16  . Cancer 2006    uterine  . Complication of anesthesia PT STATES HAS LETTER FROM [facility]. STATING THAT SHE HAS A VERY SMALL AIRWAY (LAST SURG. HYSTERECTOMY AT [place] 2006    PT STATES LYNN BEASON CRNA WHOM WORKS HERE TOLD SHE WOULD BE OKAY TO BE DONE AT [facility]      Past Surgical History  Procedure Laterality Date  . Cesarean section  1989  . Abdominal hysterectomy  12-20-2004    TAH  . Bilateral breast reduction  11-16-1999  . Uvulopalatopharyngoplasty, tonsillectomy and septoplasty  1994  . Cystoscopy  09/06/2011  Procedure: CYSTOSCOPY;  Surgeon: Ailene Rud, MD;  Location: [facility];  Service: Urology;  Laterality: N/A;  LYNX SLING   . Pubovaginal sling  09/06/2011    Procedure: Gaynelle Arabian;  Surgeon: Ailene Rud, MD;  Location: [facility];  Service: Urology;  Laterality: N/A;  . Hemorrhoid banding  02/2013  . Breath tek h pylori N/A 05/27/2013    Procedure: BREATH TEK H PYLORI;  Surgeon: Shann Medal, MD;  Location: [facility];  Service: General;  Laterality: N/A;  . Gastric roux-en-y N/A 10/04/2013    Procedure: LAPAROSCOPIC ROUX-EN-Y GASTRIC BYPASS WITH UPPER ENDOSCOPY;  Surgeon: Shann Medal, MD;  Location: [facility];  Service: General;  Laterality: N/A;      Current Outpatient Prescriptions    Medication Sig Dispense Refill  . benazepril (LOTENSIN) 40 MG tablet Take 40 mg by mouth every evening.      . cholecalciferol (VITAMIN D) 1000 UNITS tablet Take 2,000 Units by mouth 2 (two) times daily.      . Linaclotide (LINZESS) 145 MCG CAPS capsule Take 145 mcg by mouth every other day.      . metFORMIN (GLUCOPHAGE) 1000 MG tablet Take 1,000 mg by mouth 2 (two) times daily with a meal.      . sitaGLIPtin (JANUVIA) 100 MG tablet Take 100 mg by mouth daily.        No current facility-administered medications for this visit.     No Known Allergies  REVIEW OF SYSTEMS: Cardiac:  Hypertension x 25 years.  She had chest pain in 07/2011 which prompted an evaluation by Dr. Burt Knack which was neg.  Her follow up with him was PRN. Pulmonary:  OSA since 2001.  But cannot tolerate CPAP, so she does not use it most of the time. Breasts:  Had bilateral breast reduction in 2001 by Dr. [place] Nation.  Endocrine:  Diabetes mellitus x 10 years.  No thyroid disease. Gastrointestinal:  Had neg upper endo about 2008 by Dr. Collene Mares.  She was told that she had increasing LFT's on recent labs.  No history of gall bladder disease.  No history of pancreas disease.  Had neg colonoscopy by Dr. Collene Mares 2010. Urologic:  No history of kidney stones.  No history of bladder infections. GYN:  Hysterectomy - 2006 - for endometrial cancer by Dr. Hebert Soho. Psycho-social:  Saw Dr. Ardath Sax preop.  SOCIAL and FAMILY HISTORY: Married. Works at the [employer]. Has two children: Son and daughter.   PHYSICAL EXAM: BP 122/74  Pulse 70  Temp(Src) 98.1 F (36.7 C)  Ht 5\' 1"  (1.549 m)  Wt 167 lb (75.751 kg)  BMI 31.57 kg/m2  General: Obese WF who is alert and generally healthy appearing.  HEENT: Normal. Pupils equal. Lungs: Clear to auscultation and symmetric breath sounds. Heart:  RRR. No murmur or rub. Abdomen: Soft. No mass. Her incisions look good.  BS present.   DATA REVIEWED: Epic notes.  Alphonsa Overall, MD,   [facility], [address]  [place], [place]    [place] Phone:  [phone] FAX:  [phone]

## 2013-11-03 ENCOUNTER — Encounter (HOSPITAL_COMMUNITY): Payer: Self-pay | Admitting: Surgery

## 2013-11-25 ENCOUNTER — Encounter: Payer: 59 | Attending: Surgery | Admitting: Dietician

## 2013-11-25 DIAGNOSIS — Z01818 Encounter for other preprocedural examination: Secondary | ICD-10-CM | POA: Insufficient documentation

## 2013-11-25 DIAGNOSIS — Z713 Dietary counseling and surveillance: Secondary | ICD-10-CM | POA: Insufficient documentation

## 2013-11-25 DIAGNOSIS — E669 Obesity, unspecified: Secondary | ICD-10-CM | POA: Insufficient documentation

## 2013-11-25 NOTE — Progress Notes (Signed)
  Follow-up visit:  8 Weeks Post-Operative RYGB Surgery  Medical Nutrition Therapy:  Appt start time: 0930 end time:  1000.  Primary concerns today: Post-operative Bariatric Surgery Nutrition Management. Off all of her medication which was her first goal. Would like to get down to about 140 lbs, but she is content with where she is at now. Eggs do not agree with her. Tried peanut butter which was "too dry".  Patient was seen on 10/19/2013 for Post-Operative Nutrition education at [facility].  Surgery date: 10/04/2013 Surgery type: RYGB Start weight at [facility]: 196 on 06/11/2013 Weight today: 154 lbs lbs  Weight change: 16 lbs Total weight loss: 42 lbs Weight loss goal: ~140 lbs % goal achieved: 75%  TANITA  BODY COMP RESULTS  09/06/13 10/19/2013 11/25/13   BMI (kg/m^2) 37.1 32.1 29.1   Fat Mass (lbs) 83.5 65.5 55.5   Fat Free Mass (lbs) 113 104.5 98.5   Total Body Water (lbs) 82.5 76.5 72.0    Preferred Learning Style:   No preference indicated   Learning Readiness:   Ready  24-hr recall: B (AM): premier protein (30 g) Snk (AM): none  L (PM): 2-3 oz chicken or tuna or salmon (14-21 oz) Snk (PM): cheese stick sometimes (6 g)  D (PM): 2-3 oz grilled chicken or rib eye or other protein (14-21 oz) Snk (PM): none, but may have another premier (30 g)  Fluid intake: 22 oz protein shake, 44 oz lemonade with splenda, water sometimes (~66 oz)  Estimated total protein intake: 88-108 g  Medications: no medications except vitamins Supplementation: taking (Flinstones without iron), B12, and chewable calcium with vitamin D, additional vitamin D   CBG monitoring: testing 2 x day (morning and night) Average CBG per patient: 92 in morning and 117-121 mg/dl at night (highest peak at 141 mg/dl) Last patient reported A1c: 7.4% in February  Using straws: occassionally, trying not to Drinking while eating: No Hair loss: No Carbonated beverages: No N/V/D/C: has  had a lot of nausea in morning until lunch and subsides though is getting, constipation-linzess Dumping syndrome: None  Recent physical activity:  Walking everyday for 5-10 minutes and swim in pool, active lifestyle at home   Progress Towards Goal(s):  In progress.  Handouts given during visit include:  Phase 3B High Protein + Non Vegetables   Nutritional Diagnosis:  [place]-3.3 Overweight/obesity related to past poor dietary habits and physical inactivity as evidenced by patient w/ recent RYGB surgery following dietary guidelines for continued weight loss.    Intervention:  Nutrition education/diet advancement.  Teaching Method Utilized:  Visual Auditory Hands on  Barriers to learning/adherence to lifestyle change: none  Demonstrated degree of understanding via:  Teach Back   Monitoring/Evaluation:  Dietary intake, exercise, lap band fills, and body weight. Follow up in 1 months for 3 month post-op visit.

## 2013-11-25 NOTE — Patient Instructions (Signed)
Goals:  Follow Phase 3B: High Protein + Non-Starchy Vegetables  Eat 3-6 small meals/snacks, every 3-5 hrs  Increase lean protein foods to meet 60g goal  Increase fluid intake to 64oz +  Avoid drinking 15 minutes before, during and 30 minutes after eating  Aim for >30 min of physical activity daily  

## 2013-12-28 ENCOUNTER — Encounter: Payer: 59 | Attending: Surgery | Admitting: Dietician

## 2013-12-28 DIAGNOSIS — Z01818 Encounter for other preprocedural examination: Secondary | ICD-10-CM | POA: Diagnosis present

## 2013-12-28 DIAGNOSIS — Z713 Dietary counseling and surveillance: Secondary | ICD-10-CM | POA: Insufficient documentation

## 2013-12-28 DIAGNOSIS — E669 Obesity, unspecified: Secondary | ICD-10-CM | POA: Insufficient documentation

## 2013-12-28 NOTE — Progress Notes (Signed)
  Follow-up visit:  12 Weeks Post-Operative RYGB Surgery  Medical Nutrition Therapy:  Appt start time: 1200 end time:  1230.  Primary concerns today: Post-operative Bariatric Surgery Nutrition Management. Returns with another 10 lbs weight loss. Body fat % is 29.6%. Tolerating most foods except spicy foods and eggs. "Struggling" to eat dairy sometimes, maybe burned out on it.   Has been back to work since 12/06/2013 and is feeling very tired which could be d/t the nature of her job. Has not been sleeping well, wakes up a lot, can't fall asleep. Has tried Melatonin which works sometimes.   Surgery date: 10/04/2013 Surgery type: RYGB Start weight at [facility]: 196 on 06/11/2013 Weight today: 144 lbs lbs  Weight change: 10 lbs Total weight loss: 52 lbs Weight loss goal: ~140 lbs % goal achieved: 75%  TANITA  BODY COMP RESULTS  09/06/13 10/19/2013 11/25/13 12/28/13   BMI (kg/m^2) 37.1 32.1 29.1 27.2   Fat Mass (lbs) 83.5 65.5 55.5 42.5   Fat Free Mass (lbs) 113 104.5 98.5 101.5   Total Body Water (lbs) 82.5 76.5 72.0 74.5    Preferred Learning Style:   No preference indicated   Learning Readiness:   Ready  24-hr recall: B (AM): premier protein (30 g) Snk (AM): cheese stick (6 g)  L (PM): 2-3 oz chicken or tuna or salmon (14-21 oz) Snk (PM): cheese stick sometimes (6 g)  D (PM): 2-3 oz grilled chicken or rib eye or other protein (14-21 oz) Snk (PM): none, but may have another premier (30 g)  Fluid intake: 22 oz protein shake, 44 oz lemonade with splenda, water sometimes (~66 oz)  Estimated total protein intake: 88-108 g  Medications: no medications except vitamins Supplementation: taking (Flinstones without iron), B12, and chewable calcium with vitamin D, additional vitamin D   CBG monitoring: testing 2-3 x day  Average CBG per patient: 89-96 in morning and 98 mg/dl at night (highest peak at 173 mg/dl after [place] food) Last patient reported A1c: 7.4% in February  Using straws:  occassionally, trying not to Drinking while eating: No Hair loss: No Carbonated beverages: No N/V/D/C: has had a lot of nausea in morning until lunch and subsides though is getting, constipation-uses Miralax Dumping syndrome: None  Recent physical activity:  Walking everyday for 30 minutes   Progress Towards Goal(s):  In progress.    Nutritional Diagnosis:  [place]-3.3 Overweight/obesity related to past poor dietary habits and physical inactivity as evidenced by patient w/ recent RYGB surgery following dietary guidelines for continued weight loss.    Intervention:  Nutrition education/diet advancement.  Teaching Method Utilized:  Visual Auditory Hands on  Barriers to learning/adherence to lifestyle change: none  Demonstrated degree of understanding via:  Teach Back   Monitoring/Evaluation:  Dietary intake, exercise, lap band fills, and body weight. Follow up in 3 months for 6 month post-op visit.

## 2013-12-28 NOTE — Patient Instructions (Signed)
Goals:  Follow Phase 3B: High Protein + Non-Starchy Vegetables  Eat 3-6 small meals/snacks, every 3-5 hrs  Increase lean protein foods to meet 60g goal  Increase fluid intake to 64oz +  Avoid drinking 15 minutes before, during and 30 minutes after eating  Aim for >30 min of physical activity daily  Add 15 g of CHO to meals and snacks (crackers, fruit, whole wheat bread)  Try not looking at electronics in the hour before bed  Think about talking to your doctor about difficultly with sleep

## 2014-01-14 LAB — COMPREHENSIVE METABOLIC PANEL
ALT: 47 U/L — ABNORMAL HIGH (ref 0–35)
AST: 23 U/L (ref 0–37)
Albumin: 4 g/dL (ref 3.5–5.2)
Alkaline Phosphatase: 73 U/L (ref 39–117)
BUN: 22 mg/dL (ref 6–23)
CO2: 31 mEq/L (ref 19–32)
Calcium: 9.7 mg/dL (ref 8.4–10.5)
Chloride: 103 mEq/L (ref 96–112)
Creat: 0.47 mg/dL — ABNORMAL LOW (ref 0.50–1.10)
Glucose, Bld: 66 mg/dL — ABNORMAL LOW (ref 70–99)
Potassium: 4.2 mEq/L (ref 3.5–5.3)
Sodium: 141 mEq/L (ref 135–145)
Total Bilirubin: 0.5 mg/dL (ref 0.2–1.2)
Total Protein: 5.8 g/dL — ABNORMAL LOW (ref 6.0–8.3)

## 2014-01-14 LAB — FOLATE: Folate: >20 ng/mL

## 2014-01-14 LAB — MAGNESIUM: Magnesium: 1.8 mg/dL (ref 1.5–2.5)

## 2014-01-14 LAB — VITAMIN B12: Vitamin B-12: 635 pg/mL (ref 211–911)

## 2014-01-17 ENCOUNTER — Other Ambulatory Visit: Payer: Self-pay

## 2014-01-17 DIAGNOSIS — Z1231 Encounter for screening mammogram for malignant neoplasm of breast: Secondary | ICD-10-CM

## 2014-01-19 LAB — VITAMIN B1: Vitamin B1 (Thiamine): 17 nmol/L (ref 8–30)

## 2014-01-21 ENCOUNTER — Ambulatory Visit (INDEPENDENT_AMBULATORY_CARE_PROVIDER_SITE_OTHER): Payer: Commercial Managed Care - PPO | Admitting: Surgery

## 2014-01-21 ENCOUNTER — Other Ambulatory Visit (INDEPENDENT_AMBULATORY_CARE_PROVIDER_SITE_OTHER): Payer: Self-pay

## 2014-01-21 DIAGNOSIS — Z1231 Encounter for screening mammogram for malignant neoplasm of breast: Secondary | ICD-10-CM

## 2014-01-27 ENCOUNTER — Ambulatory Visit (AMBULATORY_SURGERY_CENTER): Payer: Self-pay | Admitting: *Deleted

## 2014-01-27 VITALS — Ht 61.0 in | Wt 139.0 lb

## 2014-01-27 DIAGNOSIS — Z86010 Personal history of colonic polyps: Secondary | ICD-10-CM

## 2014-01-27 NOTE — Progress Notes (Signed)
No egg or soy allergy  Pt states she was told that she has a very small airway with her hysterectomy but has had surgery since then without any problems

## 2014-01-31 ENCOUNTER — Other Ambulatory Visit: Payer: Self-pay

## 2014-01-31 ENCOUNTER — Telehealth: Payer: Self-pay

## 2014-01-31 DIAGNOSIS — Z1211 Encounter for screening for malignant neoplasm of colon: Secondary | ICD-10-CM

## 2014-01-31 NOTE — Telephone Encounter (Signed)
I spoke with Sandra [name], she says no problems with intubation and wants to keep appt here.  She will talk with Osvaldo Angst and get back with me if he still believes she does not qualify for care in the [facility]

## 2014-01-31 NOTE — Telephone Encounter (Signed)
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  Consequently she does not qualify for care at [facility]            Best,            Osvaldo Angst       ------

## 2014-02-02 ENCOUNTER — Ambulatory Visit: Payer: 59

## 2014-02-03 ENCOUNTER — Encounter: Payer: 59 | Admitting: Internal Medicine

## 2014-02-25 ENCOUNTER — Ambulatory Visit
Admission: RE | Admit: 2014-02-25 | Discharge: 2014-02-25 | Disposition: A | Discharge status: Home / Self Care | Payer: 59 | Source: Ambulatory Visit

## 2014-03-29 ENCOUNTER — Ambulatory Visit: Payer: 59 | Admitting: Dietician

## 2014-03-30 ENCOUNTER — Encounter: Payer: 59 | Admitting: Gynecology

## 2014-04-05 ENCOUNTER — Encounter (HOSPITAL_COMMUNITY): Payer: Self-pay | Admitting: *Deleted

## 2014-04-25 ENCOUNTER — Ambulatory Visit (HOSPITAL_COMMUNITY): Payer: 59 | Admitting: Anesthesiology

## 2014-04-25 ENCOUNTER — Ambulatory Visit (HOSPITAL_COMMUNITY)
Admission: RE | Admit: 2014-04-25 | Discharge: 2014-04-25 | Disposition: A | Discharge status: Home / Self Care | Payer: 59 | Source: Ambulatory Visit | Attending: Internal Medicine | Admitting: Internal Medicine

## 2014-04-25 ENCOUNTER — Encounter (HOSPITAL_COMMUNITY)
Admission: RE | Disposition: A | Discharge status: Home / Self Care | Payer: Self-pay | Source: Ambulatory Visit | Attending: Internal Medicine

## 2014-04-25 ENCOUNTER — Encounter (HOSPITAL_COMMUNITY): Payer: Self-pay | Admitting: Internal Medicine

## 2014-04-25 DIAGNOSIS — Z8542 Personal history of malignant neoplasm of other parts of uterus: Secondary | ICD-10-CM | POA: Insufficient documentation

## 2014-04-25 DIAGNOSIS — D123 Benign neoplasm of transverse colon: Secondary | ICD-10-CM

## 2014-04-25 DIAGNOSIS — D120 Benign neoplasm of cecum: Secondary | ICD-10-CM

## 2014-04-25 DIAGNOSIS — G4733 Obstructive sleep apnea (adult) (pediatric): Secondary | ICD-10-CM | POA: Diagnosis not present

## 2014-04-25 DIAGNOSIS — I10 Essential (primary) hypertension: Secondary | ICD-10-CM | POA: Diagnosis not present

## 2014-04-25 DIAGNOSIS — Z860100 Personal history of colon polyps, unspecified: Secondary | ICD-10-CM | POA: Insufficient documentation

## 2014-04-25 DIAGNOSIS — E785 Hyperlipidemia, unspecified: Secondary | ICD-10-CM | POA: Diagnosis not present

## 2014-04-25 DIAGNOSIS — I2510 Atherosclerotic heart disease of native coronary artery without angina pectoris: Secondary | ICD-10-CM | POA: Diagnosis not present

## 2014-04-25 DIAGNOSIS — Z1211 Encounter for screening for malignant neoplasm of colon: Secondary | ICD-10-CM | POA: Diagnosis present

## 2014-04-25 DIAGNOSIS — E119 Type 2 diabetes mellitus without complications: Secondary | ICD-10-CM | POA: Insufficient documentation

## 2014-04-25 DIAGNOSIS — K219 Gastro-esophageal reflux disease without esophagitis: Secondary | ICD-10-CM | POA: Insufficient documentation

## 2014-04-25 HISTORY — DX: Unspecified osteoarthritis, unspecified site: M19.90

## 2014-04-25 HISTORY — PX: COLONOSCOPY: SHX5424

## 2014-04-25 HISTORY — DX: Gastro-esophageal reflux disease without esophagitis: K21.9

## 2014-04-25 HISTORY — DX: Personal history of colonic polyps: Z86.010

## 2014-04-25 SURGERY — COLONOSCOPY
Anesthesia: Monitor Anesthesia Care

## 2014-04-25 MED ORDER — PROPOFOL 10 MG/ML IV BOLUS
INTRAVENOUS | Status: DC | PRN
  Administered 2014-04-25: 50 mg via INTRAVENOUS

## 2014-04-25 MED ORDER — PROPOFOL 10 MG/ML IV BOLUS
INTRAVENOUS | Status: AC
  Filled 2014-04-25: qty 20

## 2014-04-25 MED ORDER — LACTATED RINGERS IV SOLN
INTRAVENOUS | Status: DC
  Administered 2014-04-25: 1000 mL via INTRAVENOUS

## 2014-04-25 MED ORDER — PROPOFOL INFUSION 10 MG/ML OPTIME
INTRAVENOUS | Status: DC | PRN
  Administered 2014-04-25: 300 ug/kg/min via INTRAVENOUS

## 2014-04-25 MED ORDER — SODIUM CHLORIDE 0.9 % IV SOLN: INTRAVENOUS | Status: DC

## 2014-04-25 NOTE — Anesthesia Postprocedure Evaluation (Signed)
  Anesthesia Post-op Note  Patient: Sandra [name]  Procedure(s) Performed: Procedure(s) (LRB): COLONOSCOPY (N/A)  Patient Location: PACU  Anesthesia Type: MAC  Level of Consciousness: awake and alert   Airway and Oxygen Therapy: Patient Spontanous Breathing  Post-op Pain: mild  Post-op Assessment: Post-op Vital signs reviewed, Patient's Cardiovascular Status Stable, Respiratory Function Stable, Patent Airway and No signs of Nausea or vomiting  Last Vitals:  Filed Vitals:   04/25/14 1130  BP: 131/89  Pulse: 58  Temp:   Resp: 18    Post-op Vital Signs: stable   Complications: No apparent anesthesia complications

## 2014-04-25 NOTE — H&P (Signed)
[facility] History and Physical   Primary Care Physician:  Stephens Shire, MD   Reason for Procedure:  Hx sessile serrated polyp of colobn      Plan:    Colonoscopy. The risks and benefits as well as alternatives of endoscopic procedure(s) have been discussed and reviewed. All questions answered. The patient agrees to proceed.      HPI: Sandra [name] is a 53 y.o. female with a history of colonic polyps in 12/2009 - here for a surveillance colonoscopy.   Past Medical History  Diagnosis Date  . Hx of adenomatous colonic polyps 2011    BENIGN  . History of endometrial cancer 2006 ---- S/P ABD. HYSTERECTOMY    STAGE I ENDOMETROID CARCINOMA  . CAD (coronary artery disease) NON-OBSTRUCTIVE PER DR HOCHREIN NOTE 08-26-2011  . Cancer 2006    uterine  . Complication of anesthesia PT STATES HAS LETTER FROM [facility]. STATING THAT SHE HAS A VERY SMALL AIRWAY (LAST SURG. HYSTERECTOMY AT [facility] IN 2006    PT STATES LYNN BEASON CRNA WHOM WORKS HERE TOLD SHE WOULD BE OKAY TO BE DONE AT [facility]  . Diabetes mellitus     no meds taken now  . Hypertension     no meds now  . Hyperlipidemia     no meds now  . Heart murmur     with last visit, MD states "outgrown" murmur  . OSA on CPAP     cpap setting of 16  . Sleep apnea     no CPAP since surgery  . GERD (gastroesophageal reflux disease)   . Arthritis     Past Surgical History  Procedure Laterality Date  . Cesarean section  1989  . Bilateral breast reduction  11-16-1999  . Uvulopalatopharyngoplasty, tonsillectomy and septoplasty  1994  . Cystoscopy  09/06/2011    Procedure: CYSTOSCOPY;  Surgeon: Ailene Rud, MD;  Location: [facility];  Service: Urology;  Laterality: N/A;  LYNX SLING   . Pubovaginal sling  09/06/2011    Procedure: Gaynelle Arabian;  Surgeon: Ailene Rud, MD;  Location: [facility];  Service: Urology;  Laterality: N/A;  . Hemorrhoid banding  02/2013  .  Breath tek h pylori N/A 05/27/2013    Procedure: BREATH TEK H PYLORI;  Surgeon: Shann Medal, MD;  Location: [facility];  Service: General;  Laterality: N/A;  . 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 Abdominal hysterectomy  12-20-2004    TAH  . Colonoscopy      Prior to Admission medications   Medication Sig Start Date End Date Taking? Authorizing Provider  Calcium Carb-Cholecalciferol (CALCIUM PLUS VITAMIN D3 PO) Take 1 tablet by mouth 3 (three) times daily.    Yes Historical Provider, MD  Multiple Vitamins-Minerals (MULTIVITAMIN PO) Take 1 tablet by mouth 2 (two) times daily.    Yes Historical Provider, MD  ibuprofen (ADVIL,MOTRIN) 200 MG tablet Take 400 mg by mouth every 6 (six) hours as needed for headache or mild pain.    Historical Provider, MD    Current Facility-Administered Medications  Medication Dose Route Frequency Provider Last Rate Last Dose  . 0.9 %  sodium chloride infusion   Intravenous Continuous Gatha Mayer, MD        Allergies as of 01/31/2014  . (No Known Allergies)    Family  History  Problem Relation Age of Onset  . Adopted: Yes    History   Social History  . Marital Status: Single    Spouse Name: N/A    Number of Children: N/A  . Years of Education: N/A   Occupational History  . Not on file.   Social History Main Topics  . Smoking status: Never Smoker   . Smokeless tobacco: Never Used  . Alcohol Use: No  . Drug Use: No  . Sexual Activity: Yes    Birth Control/ Protection: Surgical     Comment: HYST   Other Topics Concern  . Not on file   Social History Narrative   Married, second time, has children and cares for granddaughter   Endoscopy Tech at [employer]    Review of Systems: + grief reaction after death of father recently All other review of systems negative  except as mentioned in the HPI.  Physical Exam: Vital signs in last 24 hours:     General:   Alert,  Well-developed, well-nourished, pleasant and cooperative in NAD Lungs:  Clear throughout to auscultation.   Heart:  Regular rate and rhythm; no murmurs, clicks, rubs,  or gallops. Abdomen:  Soft, nontender and nondistended. Normal bowel sounds.   Neuro/Psych:  Alert and cooperative. Normal mood and affect. A and O x 3   @Gillian Kluever  Simonne Maffucci, MD, [facility] [phone] (pager) 04/25/2014 9:08 AM@

## 2014-04-25 NOTE — Discharge Instructions (Signed)
° °  I found and removed 3 small polyps today. Everything else was ok. Good prep.  I will let you know pathology results and when to have another routine colonoscopy by mail.  I appreciate the opportunity to care for you. Gatha Mayer, MD, FACG   YOU HAD AN ENDOSCOPIC PROCEDURE TODAY: Refer to the procedure report and other information in the discharge instructions given to you for any specific questions about what was found during the examination. If this information does not answer your questions, please call Dr. Celesta Aver office at [phone] to clarify.   YOU SHOULD EXPECT: Some feelings of bloating in the abdomen. Passage of more gas than usual. Walking can help get rid of the air that was put into your GI tract during the procedure and reduce the bloating. If you had a lower endoscopy (such as a colonoscopy or flexible sigmoidoscopy) you may notice spotting of blood in your stool or on the toilet paper. Some abdominal soreness may be present for a day or two, also.  DIET: Your first meal following the procedure should be a light meal and then it is ok to progress to your normal diet. A half-sandwich or bowl of soup is an example of a good first meal. Heavy or fried foods are harder to digest and may make you feel nauseous or bloated. Drink plenty of fluids but you should avoid alcoholic beverages for 24 hours.   ACTIVITY: Your care partner should take you home directly after the procedure. You should plan to take it easy, moving slowly for the rest of the day. You can resume normal activity the day after the procedure however YOU SHOULD NOT DRIVE, use power tools, machinery or perform tasks that involve climbing or major physical exertion for 24 hours (because of the sedation medicines used during the test).   SYMPTOMS TO REPORT IMMEDIATELY: A gastroenterologist can be reached at any hour. Please call [phone]  for any of the following symptoms:  Following lower endoscopy  (colonoscopy, flexible sigmoidoscopy) Excessive amounts of blood in the stool  Significant tenderness, worsening of abdominal pains  Swelling of the abdomen that is new, acute  Fever of 100 or higher   FOLLOW UP:  If any biopsies were taken you will be contacted by phone or by letter within the next 1-3 weeks. Call [phone]  if you have not heard about the biopsies in 3 weeks.  Please also call with any specific questions about appointments or follow up tests.

## 2014-04-25 NOTE — Op Note (Signed)
[facility] [address] [place], [address]   COLONOSCOPY PROCEDURE REPORT  PATIENT: Sandra [name], Sandra [name]  MR#: [id number] BIRTHDATE: 1960/08/02 , 80  yrs. old GENDER: female ENDOSCOPIST: Gatha Mayer, MD, [facility] PROCEDURE DATE:  04/25/2014 PROCEDURE:   Colonoscopy with biopsy First Screening Colonoscopy - Avg.  risk and is 50 yrs.  old or older - No.  Prior Negative Screening - Now for repeat screening. N/A  History of Adenoma - Now for follow-up colonoscopy & has been > or = to 3 yrs.  Yes hx of adenoma.  Has been 3 or more years since last colonoscopy.  Polyps Removed Today? Yes. ASA CLASS:   Class II INDICATIONS:surveillance colonoscopy based on a history of adenomatous colonic polyp(s). MEDICATIONS: Monitored anesthesia care and Per Anesthesia  DESCRIPTION OF PROCEDURE:   After the risks benefits and alternatives of the procedure were thoroughly explained, informed consent was obtained.  The digital rectal exam revealed no abnormalities of the rectum.   The Pentax Adult Colonscope [id number] endoscope was introduced through the anus and advanced to the cecum, which was identified by both the appendix and ileocecal valve. No adverse events experienced.   The quality of the prep was good, using MiraLax  The instrument was then slowly withdrawn as the colon was fully examined.      COLON FINDINGS: Three sessile polyps ranging from 1 to 72mm in size were found.  Polypectomies were performed with cold forceps.  The resection was complete, the polyp tissue was completely retrieved and sent to histology.   The examination was otherwise normal. Retroflexed views revealed no abnormalities. The time to cecum=4 minutes 0 seconds.  Withdrawal time=18 minutes 0 seconds.  The scope was withdrawn and the procedure completed. COMPLICATIONS: There were no immediate complications.  ENDOSCOPIC IMPRESSION: 1.   Three sessile polyps ranging from 1 to 56mm in size were  found; polypectomies were performed with cold forceps 2.   The examination was otherwise normal - good prep - hx sessile serrated polyp and 2 hyperplastic polyps 2011  RECOMMENDATIONS: Timing of repeat colonoscopy will be determined by pathology findings.  eSigned:  Gatha Mayer, MD, [facility] 04/25/2014 11:17 AM   cc: The Patient and Juanita Craver, MD

## 2014-04-25 NOTE — Anesthesia Preprocedure Evaluation (Addendum)
Anesthesia Evaluation  Patient identified by MRN, date of birth, ID band Patient awake    Reviewed: Allergy & Precautions, H&P , NPO status , Patient's Chart, lab work & pertinent test results  History of Anesthesia Complications (+) DIFFICULT AIRWAY  Airway Mallampati: III  TM Distance: >3 FB Neck ROM: full    Dental no notable dental hx. (+) Teeth Intact, Dental Advisory Given   Pulmonary sleep apnea ,  Had sleep apnea surgery breath sounds clear to auscultation  Pulmonary exam normal       Cardiovascular hypertension, Pt. on medications + CAD Rhythm:regular Rate:Normal  Non-obstructive CAD 4/13   Neuro/Psych negative neurological ROS  negative psych ROS   GI/Hepatic negative GI ROS, Neg liver ROS,   Endo/Other  diabetes, Well Controlled, Type 2Diet controlled DM  Renal/GU negative Renal ROS  negative genitourinary   Musculoskeletal   Abdominal (+) - obese,   Peds  Hematology negative hematology ROS (+)   Anesthesia Other Findings   Reproductive/Obstetrics negative OB ROS                           Anesthesia Physical Anesthesia Plan  ASA: III  Anesthesia Plan: MAC   Post-op Pain Management:    Induction:   Airway Management Planned:   Additional Equipment:   Intra-op Plan:   Post-operative Plan:   Informed Consent:   Plan Discussed with: Surgeon  Anesthesia Plan Comments:         Anesthesia Quick Evaluation

## 2014-04-25 NOTE — Transfer of Care (Signed)
Immediate Anesthesia Transfer of Care Note  Patient: Sandra [name]  Procedure(s) Performed: Procedure(s): COLONOSCOPY (N/A)  Patient Location: PACU and Endoscopy Unit  Anesthesia Type:MAC  Level of Consciousness: sedated and patient cooperative  Airway & Oxygen Therapy: Patient Spontanous Breathing and Patient connected to face mask oxygen  Post-op Assessment: Report given to PACU RN and Post -op Vital signs reviewed and stable  Post vital signs: Reviewed and stable  Complications: No apparent anesthesia complications

## 2014-04-26 ENCOUNTER — Encounter (HOSPITAL_COMMUNITY): Payer: Self-pay | Admitting: Internal Medicine

## 2014-05-05 ENCOUNTER — Encounter: Payer: Self-pay | Admitting: Internal Medicine

## 2014-05-05 NOTE — Progress Notes (Signed)
Quick Note:  2 small adenomas - repeat colon 2021 ______

## 2014-05-05 NOTE — Progress Notes (Signed)
Quick Note:  2 small adenomas repeat colon 2016 ______

## 2014-05-18 LAB — COMPREHENSIVE METABOLIC PANEL
ALT: 24 U/L (ref 0–35)
AST: 19 U/L (ref 0–37)
Albumin: 4.3 g/dL (ref 3.5–5.2)
Alkaline Phosphatase: 53 U/L (ref 39–117)
BUN: 24 mg/dL — ABNORMAL HIGH (ref 6–23)
CO2: 31 mEq/L (ref 19–32)
Calcium: 9.8 mg/dL (ref 8.4–10.5)
Chloride: 103 mEq/L (ref 96–112)
Creat: 0.54 mg/dL (ref 0.50–1.10)
Glucose, Bld: 90 mg/dL (ref 70–99)
Potassium: 4.4 mEq/L (ref 3.5–5.3)
Sodium: 143 mEq/L (ref 135–145)
Total Bilirubin: 0.4 mg/dL (ref 0.2–1.2)
Total Protein: 6.3 g/dL (ref 6.0–8.3)

## 2014-05-18 LAB — CBC WITH DIFFERENTIAL/PLATELET
Basophils Absolute: 0.1 10*3/uL (ref 0.0–0.1)
Basophils Relative: 1 % (ref 0–1)
Eosinophils Absolute: 0.5 10*3/uL (ref 0.0–0.7)
Eosinophils Relative: 7 % — ABNORMAL HIGH (ref 0–5)
HCT: 40.7 % (ref 36.0–46.0)
Hemoglobin: 13.3 g/dL (ref 12.0–15.0)
Lymphocytes Relative: 46 % (ref 12–46)
Lymphs Abs: 3.5 10*3/uL (ref 0.7–4.0)
MCH: 30.4 pg (ref 26.0–34.0)
MCHC: 32.7 g/dL (ref 30.0–36.0)
MCV: 93.1 fL (ref 78.0–100.0)
Monocytes Absolute: 0.5 10*3/uL (ref 0.1–1.0)
Monocytes Relative: 6 % (ref 3–12)
Neutro Abs: 3 10*3/uL (ref 1.7–7.7)
Neutrophils Relative %: 40 % — ABNORMAL LOW (ref 43–77)
Platelets: 255 10*3/uL (ref 150–400)
RBC: 4.37 MIL/uL (ref 3.87–5.11)
RDW: 13.7 % (ref 11.5–15.5)
WBC: 7.5 10*3/uL (ref 4.0–10.5)

## 2014-05-18 LAB — MAGNESIUM: Magnesium: 2 mg/dL (ref 1.5–2.5)

## 2014-05-23 LAB — VITAMIN B1: Vitamin B1 (Thiamine): 41 nmol/L — ABNORMAL HIGH (ref 8–30)

## 2014-06-22 ENCOUNTER — Ambulatory Visit (INDEPENDENT_AMBULATORY_CARE_PROVIDER_SITE_OTHER): Payer: 59 | Admitting: Gynecology

## 2014-06-22 ENCOUNTER — Telehealth: Payer: Self-pay | Admitting: *Deleted

## 2014-06-22 ENCOUNTER — Other Ambulatory Visit (HOSPITAL_COMMUNITY)
Admission: RE | Admit: 2014-06-22 | Discharge: 2014-06-22 | Disposition: A | Discharge status: Home / Self Care | Payer: 59 | Source: Ambulatory Visit | Attending: Gynecology | Admitting: Gynecology

## 2014-06-22 ENCOUNTER — Encounter: Payer: Self-pay | Admitting: Gynecology

## 2014-06-22 VITALS — BP 130/82 | Ht 62.0 in | Wt 127.0 lb

## 2014-06-22 DIAGNOSIS — F32A Depression, unspecified: Secondary | ICD-10-CM

## 2014-06-22 DIAGNOSIS — F419 Anxiety disorder, unspecified: Secondary | ICD-10-CM

## 2014-06-22 DIAGNOSIS — F329 Major depressive disorder, single episode, unspecified: Secondary | ICD-10-CM

## 2014-06-22 DIAGNOSIS — Z8542 Personal history of malignant neoplasm of other parts of uterus: Secondary | ICD-10-CM

## 2014-06-22 DIAGNOSIS — Z1151 Encounter for screening for human papillomavirus (HPV): Secondary | ICD-10-CM | POA: Diagnosis present

## 2014-06-22 DIAGNOSIS — Z01411 Encounter for gynecological examination (general) (routine) with abnormal findings: Secondary | ICD-10-CM | POA: Diagnosis not present

## 2014-06-22 DIAGNOSIS — Z113 Encounter for screening for infections with a predominantly sexual mode of transmission: Secondary | ICD-10-CM

## 2014-06-22 DIAGNOSIS — N760 Acute vaginitis: Secondary | ICD-10-CM

## 2014-06-22 DIAGNOSIS — Z01419 Encounter for gynecological examination (general) (routine) without abnormal findings: Secondary | ICD-10-CM

## 2014-06-22 DIAGNOSIS — A499 Bacterial infection, unspecified: Secondary | ICD-10-CM

## 2014-06-22 DIAGNOSIS — B9689 Other specified bacterial agents as the cause of diseases classified elsewhere: Secondary | ICD-10-CM

## 2014-06-22 LAB — WET PREP FOR TRICH, YEAST, CLUE
Trich, Wet Prep: NONE SEEN
Yeast Wet Prep HPF POC: NONE SEEN

## 2014-06-22 MED ORDER — TINIDAZOLE 500 MG PO TABS: ORAL_TABLET | ORAL | Status: DC

## 2014-06-22 MED ORDER — PAROXETINE HCL ER 12.5 MG PO TB24: 12.5000 mg | ORAL_TABLET | ORAL | Status: DC

## 2014-06-22 MED ORDER — TINIDAZOLE 500 MG PO TABS: 500.0000 mg | ORAL_TABLET | Freq: Once | ORAL | Status: DC

## 2014-06-22 NOTE — Telephone Encounter (Signed)
Pharmacist called with directions for 2 Rx from [facility] today. The direction did not match the # of tablets. paxil 12.5 mg #1 tablet with 11 refills with direction of take 1 po daily. And tindamax 500 mg #8 with directions of take 1 po once. Per this note Rx should be Paxil CR 12.5 mg by mouth daily and Tindamax 500 mg tablets. She is to take 4 tablets today and 4 tablets tomorrow

## 2014-06-22 NOTE — Addendum Note (Signed)
Addended by: Joaquin Music on: 06/22/2014 02:37 PM   Modules accepted: Orders

## 2014-06-22 NOTE — Patient Instructions (Signed)
Generalized Anxiety Disorder Generalized anxiety disorder (GAD) is a mental disorder. It interferes with life functions, including relationships, work, and school. GAD is different from normal anxiety, which everyone experiences at some point in their lives in response to specific life events and activities. Normal anxiety actually helps [place] prepare for and get through these life events and activities. Normal anxiety goes away after the event or activity is over.  GAD causes anxiety that is not necessarily related to specific events or activities. It also causes excess anxiety in proportion to specific events or activities. The anxiety associated with GAD is also difficult to control. GAD can vary from mild to severe. People with severe GAD can have intense waves of anxiety with physical symptoms (panic attacks).  SYMPTOMS The anxiety and worry associated with GAD are difficult to control. This anxiety and worry are related to many life events and activities and also occur more days than not for 6 months or longer. People with GAD also have three or more of the following symptoms (one or more in children):  Restlessness.   Fatigue.  Difficulty concentrating.   Irritability.  Muscle tension.  Difficulty sleeping or unsatisfying sleep. DIAGNOSIS GAD is diagnosed through an assessment by your health care provider. Your health care provider will ask you questions aboutyour mood,physical symptoms, and events in your life. Your health care provider may ask you about your medical history and use of alcohol or drugs, including prescription medicines. Your health care provider may also do a physical exam and blood tests. Certain medical conditions and the use of certain substances can cause symptoms similar to those associated with GAD. Your health care provider may refer you to a mental health specialist for further evaluation. TREATMENT The following therapies are usually used to treat GAD:    Medication. Antidepressant medication usually is prescribed for long-term daily control. Antianxiety medicines may be added in severe cases, especially when panic attacks occur.   Talk therapy (psychotherapy). Certain types of talk therapy can be helpful in treating GAD by providing support, education, and guidance. A form of talk therapy called cognitive behavioral therapy can teach you healthy ways to think about and react to daily life events and activities.  Stress managementtechniques. These include yoga, meditation, and exercise and can be very helpful when they are practiced regularly. A mental health specialist can help determine which treatment is best for you. Some people see improvement with one therapy. However, other people require a combination of therapies. Document Released: 08/10/2012 Document Revised: 08/30/2013 Document Reviewed: 08/10/2012 [facility] Patient Information 2015 [place], [place]. This information is not intended to replace advice given to you by your health care provider. Make sure you discuss any questions you have with your health care provider. Depression Depression refers to feeling sad, low, down in the dumps, blue, gloomy, or empty. In general, there are two kinds of depression:  Normal sadness or normal grief. This kind of depression is one that we all feel from time to time after upsetting life experiences, such as the loss of a job or the ending of a relationship. This kind of depression is considered normal, is short lived, and resolves within a few days to 2 weeks. Depression experienced after the loss of a loved one (bereavement) often lasts longer than 2 weeks but normally gets better with time.  Clinical depression. This kind of depression lasts longer than normal sadness or normal grief or interferes with your ability to function at home, at work, and in school.  It also interferes with your personal relationships. It affects almost every aspect of your  life. Clinical depression is an illness. Symptoms of depression can also be caused by conditions other than those mentioned above, such as:  Physical illness. Some physical illnesses, including underactive thyroid gland (hypothyroidism), severe anemia, specific types of cancer, diabetes, uncontrolled seizures, heart and lung problems, strokes, and chronic pain are commonly associated with symptoms of depression.  Side effects of some prescription medicine. In some people, certain types of medicine can cause symptoms of depression.  Substance abuse. Abuse of alcohol and illicit drugs can cause symptoms of depression. SYMPTOMS Symptoms of normal sadness and normal grief include the following:  Feeling sad or crying for short periods of time.  Not caring about anything (apathy).  Difficulty sleeping or sleeping too much.  No longer able to enjoy the things you used to enjoy.  Desire to be by oneself all the time (social isolation).  Lack of energy or motivation.  Difficulty concentrating or remembering.  Change in appetite or weight.  Restlessness or agitation. Symptoms of clinical depression include the same symptoms of normal sadness or normal grief and also the following symptoms:  Feeling sad or crying all the time.  Feelings of guilt or worthlessness.  Feelings of hopelessness or helplessness.  Thoughts of suicide or the desire to harm yourself (suicidal ideation).  Loss of touch with reality (psychotic symptoms). Seeing or hearing things that are not real (hallucinations) or having false beliefs about your life or the people around you (delusions and paranoia). DIAGNOSIS  The diagnosis of clinical depression is usually based on how bad the symptoms are and how long they have lasted. Your health care provider will also ask you questions about your medical history and substance use to find out if physical illness, use of prescription medicine, or substance abuse is causing  your depression. Your health care provider may also order blood tests. TREATMENT  Often, normal sadness and normal grief do not require treatment. However, sometimes antidepressant medicine is given for bereavement to ease the depressive symptoms until they resolve. The treatment for clinical depression depends on how bad the symptoms are but often includes antidepressant medicine, counseling with a mental health professional, or both. Your health care provider will help to determine what treatment is best for you. Depression caused by physical illness usually goes away with appropriate medical treatment of the illness. If prescription medicine is causing depression, talk with your health care provider about stopping the medicine, decreasing the dose, or changing to another medicine. Depression caused by the abuse of alcohol or illicit drugs goes away when you stop using these substances. Some adults need professional help in order to stop drinking or using drugs. SEEK IMMEDIATE MEDICAL CARE IF:  You have thoughts about hurting yourself or others.  You lose touch with reality (have psychotic symptoms).  You are taking medicine for depression and have a serious side effect. FOR MORE INFORMATION  National Alliance on Mental Illness: www.nami.[employer]: https://carter.com/ Document Released: 04/12/2000 Document Revised: 08/30/2013 Document Reviewed: 07/15/2011 [facility] Patient Information 2015 [place], [place]. This information is not intended to replace advice given to you by your health care provider. Make sure you discuss any questions you have with your health care provider. Paroxetine Controlled-Release Tablets What is this medicine? PAROXETINE (pa ROX e teen) is used to treat depression. It may also be used to treat anxiety disorders, obsessive compulsive disorder, panic attacks, post traumatic stress, and  premenstrual dysphoric disorder (PMDD). This medicine  may be used for other purposes; ask your health care provider or pharmacist if you have questions. COMMON BRAND NAME(S): Paxil CR What should I tell my health care provider before I take this medicine? They need to know if you have any of these conditions: -bleeding disorders -glaucoma -heart disease -kidney disease -liver disease -low levels of sodium in the blood -mania or bipolar disorder -seizures -suicidal thoughts, plans, or attempt; a previous suicide attempt by you or a family member -take MAOIs like Carbex, Eldepryl, Marplan, Nardil, and Parnate -take medicines that treat or prevent blood clots -an unusual or allergic reaction to paroxetine, other medicines, foods, dyes, or preservatives -pregnant or trying to get pregnant -breast-feeding How should I use this medicine? Take this medicine by mouth with a glass of water. Follow the directions on the prescription label. You can take it with or without food. Do not crush or chew this medicine. Take your medicine at regular intervals. Do not take your medicine more often than directed. Do not stop taking this medicine suddenly except upon the advice of your doctor. Stopping this medicine too quickly may cause serious side effects or your condition may worsen. A special MedGuide will be given to you by the pharmacist with each prescription and refill. Be sure to read this information carefully each time. Talk to your pediatrician regarding the use of this medicine in children. Special care may be needed. Overdosage: If you think you have taken too much of this medicine contact a poison control center or emergency room at once. NOTE: This medicine is only for you. Do not share this medicine with others. What if I miss a dose? If you miss a dose, take it as soon as you can. If it is almost time for your next dose, take only that dose. Do not take double or extra doses. What may interact with this medicine? Do not take this medicine with  any of the following medications: -linezolid -MAOIs like Carbex, Eldepryl, Marplan, Nardil, and Parnate -methylene blue (injected into a vein) -pimozide -thioridazine This medicine may also interact with the following medications: -alcohol -antacids -aspirin and aspirin-like medicines -atomoxetine -certain medicines for depression, anxiety, or psychotic disturbances -certain medicines for irregular heart beat like propafenone, flecainide, encainide, and quinidine -certain medicines for migraine headache like almotriptan, eletriptan, frovatriptan, naratriptan, rizatriptan, sumatriptan, zolmitriptan -cimetidine -digoxin -diuretics -fentanyl -fosamprenavir/ritonavir -furazolidone -isoniazid -lithium -medicines that treat or prevent blood clots like warfarin, enoxaparin, and dalteparin -medicines for sleep -metoprolol -NSAIDs, medicines for pain and inflammation, like ibuprofen or naproxen -phenobarbital -phenytoin -procarbazine -procyclidine -rasagiline -supplements like St. John's wort, kava kava, valerian -tamoxifen -theophylline -tramadol -tryptophan This list may not describe all possible interactions. Give your health care provider a list of all the medicines, herbs, non-prescription drugs, or dietary supplements you use. Also tell them if you smoke, drink alcohol, or use illegal drugs. Some items may interact with your medicine. What should I watch for while using this medicine? Tell your doctor if your symptoms do not get better or if they get worse. Visit your doctor or health care professional for regular checks on your progress. Because it may take several weeks to see the full effects of this medicine, it is important to continue your treatment as prescribed by your doctor. Patients and their families should watch out for new or worsening thoughts of suicide or depression. Also watch out for sudden changes in feelings such as feeling anxious, agitated, panicky,  irritable,  hostile, aggressive, impulsive, severely restless, overly excited and hyperactive, or not being able to sleep. If this happens, especially at the beginning of treatment or after a change in dose, call your health care professional. Dennis Bast may get drowsy or dizzy. Do not drive, use machinery, or do anything that needs mental alertness until you know how this medicine affects you. Do not stand or sit up quickly, especially if you are an older patient. This reduces the risk of dizzy or fainting spells. Alcohol may interfere with the effect of this medicine. Avoid alcoholic drinks. Your mouth may get dry. Chewing sugarless gum or sucking hard candy, and drinking plenty of water will help. Contact your doctor if the problem does not go away or is severe. What side effects may I notice from receiving this medicine? Side effects that you should report to your doctor or health care professional as soon as possible: -allergic reactions like skin rash, itching or hives, swelling of the face, lips, or tongue -black or bloody stools, blood in the urine or vomit -fast, irregular heartbeat -hallucination, loss of contact with reality -painful or prolonged erection (men) -seizures -suicidal thoughts or other mood changes -trouble passing urine or change in the amount of urine -unusual bleeding or bruising -unusually weak or tired -vomiting Side effects that usually do not require medical attention (report to your doctor or health care professional if they continue or are bothersome): -change in appetite, weight -change in sex drive or performance -constipation or diarrhea -difficulty sleeping -drowsy -headache -increased sweating -muscle pain or weakness -tremors This list may not describe all possible side effects. Call your doctor for medical advice about side effects. You may report side effects to FDA at 1-800-FDA-1088. Where should I keep my medicine? Keep out of the reach of  children. Store at or below 25 degrees C (77 degrees F). Throw away any unused medicine after the expiration date. NOTE: This sheet is a summary. It may not cover all possible information. If you have questions about this medicine, talk to your doctor, pharmacist, or health care provider.  2015, Elsevier/[employer] Standard. (2011-11-28 17:51:56)

## 2014-06-22 NOTE — Progress Notes (Signed)
Sandra [name] 1960/05/07 [id number]   History:    54 y.o.  for annual gyn exam requesting an STD screening today since her husband has been unfaithful with an individual who was a drug addict. Patient is not having any vaginal discharge. She feels depressed and very anxious because of the circumstances. She denies any suicidal ideation and has good family support.  Review of her record indicated that in 2006 she had a total abdominal hysterectomy as a result of menorrhagia and a preoperative endometrial biopsy which had demonstrated complex hyperplasia without atypia. 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 No invasion was identified. The cervix was negative for carcinoma and she had leiomyomatous uteri as well. The case had been discussed with the GYN oncologist (Dr. Threasa Heads) at the [facility]) and had recommended no further intervention at that time and she had been followed closely every 6 months and has been cancer free since then. She is getting annual CA 125 along with a pelvic ultrasound.patient would know prior history of abnormal Pap smears.  Her primary physician Dr. Tollie Pizza has been doing her lab work and has been monitoring her hypercholesterolemia, hypertension, and type 2 diabetes. 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 Alphonsa Overall. Her recent CBC incompetence metabolic panel were normal.  Past medical history,surgical history, family history and social history were all reviewed and documented in the EPIC chart.  Gynecologic History No LMP recorded. Patient has had a hysterectomy. Contraception: status post hysterectomy Last Pap: 2014. Results were: normal Last mammogram: 2015. Results were: Dense but normal 3-dimensional mammogram  Obstetric History OB History  Gravida Para Term  Preterm AB SAB TAB Ectopic Multiple Living  3 2   1 1    2     # Outcome Date GA Lbr Len/2nd Weight Sex Delivery Anes PTL Lv  3 SAB           2 Para           1 Para                ROS: A ROS was performed and pertinent positives and negatives are included in the history.  GENERAL: No fevers or chills. HEENT: No change in vision, no earache, sore throat or sinus congestion. NECK: No pain or stiffness. CARDIOVASCULAR: No chest pain or pressure. No palpitations. PULMONARY: No shortness of breath, cough or wheeze. GASTROINTESTINAL: No abdominal pain, nausea, vomiting or diarrhea, melena or bright red blood per rectum. GENITOURINARY: No urinary frequency, urgency, hesitancy or dysuria. MUSCULOSKELETAL: No joint or muscle pain, no back pain, no recent trauma. DERMATOLOGIC: No rash, no itching, no lesions. ENDOCRINE: No polyuria, polydipsia, no heat or cold intolerance. No recent change in weight. HEMATOLOGICAL: No anemia or easy bruising or bleeding. NEUROLOGIC: No headache, seizures, numbness, tingling or weakness. PSYCHIATRIC: No depression, no loss of interest in normal activity or change in sleep pattern.     Exam: chaperone present  BP 130/82 mmHg  Ht 5\' 2"  (1.575 m)  Wt 127 lb (57.607 kg)  BMI 23.22 kg/m2  Body mass index is 23.22 kg/(m^2).  General appearance : Well developed well nourished female. No acute distress HEENT: Eyes: no retinal hemorrhage or exudates,  Neck supple, trachea midline, no carotid bruits, no thyroidmegaly Lungs: Clear to auscultation, no rhonchi or wheezes, or rib retractions  Heart: Regular  rate and rhythm, no murmurs or gallops Breast:Examined in sitting and supine position were symmetrical in appearance, no palpable masses or tenderness,  no skin retraction, no nipple inversion, no nipple discharge, no skin discoloration, no axillary or supraclavicular lymphadenopathy Abdomen: no palpable masses or tenderness, no rebound or guarding Extremities: no edema or  skin discoloration or tenderness  Pelvic:  Bartholin, Urethra, Skene Glands: Within normal limits             Vagina: Fishy smelling clear discharge              Cervix: Absent  Uterus  absent  Adnexa  Without masses or tenderness  Anus and perineum  normal   Rectovaginal  normal sphincter tone without palpated masses or tenderness             Hemoccult colonoscopy 2 months ago benign polyps removed   Wet prep: Positive amine, moderate clue cells, moderate WBC, many bacteria  Assessment/Plan:  54 y.o. female for annual exam with clinical evidence of bacterial vaginosis will be treated with Tindamax 500 mg tablets. She is to take 4 tablets today and 4 tablets tomorrow. GC and Chlamydia culture pending at time of this dictation. Patient will return back to the office in a fasting state for screening fasting lipid profile as well as her TSH this week and at the same time we'll complete her STD screen to include the following: HIV, RPR, hepatitis B and C. Pap smear with HPV screening was done today. Because of patient's history of uterine cancer and recommendation by the GYN oncologist a CA 125 will be obtained as well and we'll schedule an ultrasound for assessment of her ovaries. We discussed importance of calcium and vitamin D and regular exercise for osteoporosis prevention. For her depression and anxiety she is going to be started on Paxil CR 12.5 mg by mouth daily. The risks benefits and pros and cons were discussed. She was provided with literature information as well.   Terrance Mass MD, 12:34 PM 06/22/2014

## 2014-06-23 LAB — URINALYSIS W MICROSCOPIC + REFLEX CULTURE
Bilirubin Urine: NEGATIVE
Casts: NONE SEEN
Crystals: NONE SEEN
Glucose, UA: NEGATIVE mg/dL
Hgb urine dipstick: NEGATIVE
Ketones, ur: NEGATIVE mg/dL
Nitrite: NEGATIVE
Protein, ur: NEGATIVE mg/dL
Specific Gravity, Urine: 1.024 (ref 1.005–1.030)
Urobilinogen, UA: 1 mg/dL (ref 0.0–1.0)
pH: 6 (ref 5.0–8.0)

## 2014-06-23 LAB — GC/CHLAMYDIA PROBE AMP
CT Probe RNA: NEGATIVE
GC Probe RNA: NEGATIVE

## 2014-06-25 LAB — URINE CULTURE: Colony Count: >=100000

## 2014-06-27 ENCOUNTER — Other Ambulatory Visit: Payer: 59

## 2014-06-27 DIAGNOSIS — Z01419 Encounter for gynecological examination (general) (routine) without abnormal findings: Secondary | ICD-10-CM

## 2014-06-27 DIAGNOSIS — Z113 Encounter for screening for infections with a predominantly sexual mode of transmission: Secondary | ICD-10-CM

## 2014-06-27 DIAGNOSIS — Z8542 Personal history of malignant neoplasm of other parts of uterus: Secondary | ICD-10-CM

## 2014-06-27 LAB — TSH: TSH: 0.965 u[IU]/mL (ref 0.350–4.500)

## 2014-06-27 LAB — HEPATITIS B SURFACE ANTIGEN: Hepatitis B Surface Ag: NEGATIVE

## 2014-06-27 LAB — LIPID PANEL
Cholesterol: 143 mg/dL (ref 0–200)
HDL: 47 mg/dL (ref >=46)
LDL Cholesterol: 77 mg/dL (ref 0–99)
Total CHOL/HDL Ratio: 3 Ratio
Triglycerides: 93 mg/dL (ref <150)
VLDL: 19 mg/dL (ref 0–40)

## 2014-06-27 LAB — HEPATITIS C ANTIBODY: HCV Ab: NEGATIVE

## 2014-06-27 LAB — RPR

## 2014-06-28 ENCOUNTER — Other Ambulatory Visit: Payer: Self-pay | Admitting: Gynecology

## 2014-06-28 LAB — CA 125: CA 125: 5 U/mL (ref <35)

## 2014-06-28 LAB — HIV ANTIBODY (ROUTINE TESTING W REFLEX): HIV 1&2 Ab, 4th Generation: NONREACTIVE

## 2014-06-28 LAB — CYTOLOGY - PAP

## 2014-06-28 MED ORDER — NITROFURANTOIN MONOHYD MACRO 100 MG PO CAPS: 100.0000 mg | ORAL_CAPSULE | Freq: Two times a day (BID) | ORAL | Status: DC

## 2014-07-14 ENCOUNTER — Encounter: Payer: Self-pay | Admitting: Gynecology

## 2014-07-14 ENCOUNTER — Ambulatory Visit (INDEPENDENT_AMBULATORY_CARE_PROVIDER_SITE_OTHER): Payer: 59

## 2014-07-14 ENCOUNTER — Ambulatory Visit (INDEPENDENT_AMBULATORY_CARE_PROVIDER_SITE_OTHER): Payer: 59 | Admitting: Gynecology

## 2014-07-14 VITALS — BP 110/70

## 2014-07-14 DIAGNOSIS — F4321 Adjustment disorder with depressed mood: Secondary | ICD-10-CM | POA: Diagnosis not present

## 2014-07-14 DIAGNOSIS — F4323 Adjustment disorder with mixed anxiety and depressed mood: Secondary | ICD-10-CM | POA: Insufficient documentation

## 2014-07-14 DIAGNOSIS — Z8542 Personal history of malignant neoplasm of other parts of uterus: Secondary | ICD-10-CM | POA: Diagnosis not present

## 2014-07-14 NOTE — Progress Notes (Signed)
   Patient presented to the office today to discuss her ultrasound as well as her STD screen. She was seen in the office for her annual exam in February 2016. She was requesting an STD screening today since her husband has been unfaithful with an individual who was a drug addict. She was recently treated for bacterial vaginosis as well as a urinary tract infection and has a symptomatic today. Her ultrasound had been ordered because her past history of endometrioid carcinoma (FIGO stage I A) arising in the background of complex hyperplasia. No invasion was identified. The cervix was negative for carcinoma and she had leiomyomatous uteri as well. The case had been discussed with the GYN oncologist (Dr. Threasa Heads) at the [facility]) and had recommended no further intervention at that time and she had been followed closely every 6 months and has been cancer free since then. She is getting annual CA 125 along with a pelvic ultrasound.  Patient's recent Pap smear was normal CA-125 was normal with a value of 5 Recent fasting lipid profile was normal as well as her CBC and TSH, and compress metabolic panel  (BUN 24) STD screening consisting of GC and Chlamydia culture, HIV, RPR, hepatitis B and C were negative  Ultrasound today: Absent uterus, normal left ovary. Negative fluid in the cul-de-sac. No adnexal masses seen on either adnexa. Right ovary normal.  Assessment/plan: Patient with past history of endometrioid carcinoma (FIGO stage I A1) status post total abdominal hysterectomy in 2006 has done well since then. Normal ultrasound. Normal C1 25. Negative Pap smear and negative screening STD. Patient scheduled to return back in one year for her routine annual exam at which time a baseline bone density study will be ordered. Mammogram and colonoscopy up-to-date patient was started on Paxil 12.5 mg daily last month for her depression and anxiety and has done well.

## 2014-08-17 ENCOUNTER — Other Ambulatory Visit: Payer: Self-pay | Admitting: *Deleted

## 2014-08-17 NOTE — Patient Outreach (Signed)
Received email from patient in response to request to schedule follow up Link To Wellness appointment. Sandra [name] wishes to disenroll from the program as she had successful bariatric surgery and her diabetes is not requiring medications. This RNCM will notify care management assistant to disenroll the patient from the diabetes Link To Wellness program. Barrington Ellison RN,CCM,CDE [place] Management Coordinator Office Phone [phone] Office Fax [phone]

## 2014-09-19 ENCOUNTER — Encounter: Payer: Self-pay | Admitting: Internal Medicine

## 2014-09-19 ENCOUNTER — Other Ambulatory Visit (INDEPENDENT_AMBULATORY_CARE_PROVIDER_SITE_OTHER): Payer: 59

## 2014-09-19 ENCOUNTER — Ambulatory Visit (INDEPENDENT_AMBULATORY_CARE_PROVIDER_SITE_OTHER): Payer: 59 | Admitting: Internal Medicine

## 2014-09-19 VITALS — BP 110/56 | HR 72 | Ht 61.5 in | Wt 129.4 lb

## 2014-09-19 DIAGNOSIS — R1013 Epigastric pain: Secondary | ICD-10-CM | POA: Diagnosis not present

## 2014-09-19 DIAGNOSIS — Z9884 Bariatric surgery status: Secondary | ICD-10-CM

## 2014-09-19 DIAGNOSIS — R110 Nausea: Secondary | ICD-10-CM | POA: Diagnosis not present

## 2014-09-19 LAB — CBC WITH DIFFERENTIAL/PLATELET
Basophils Absolute: 0.1 10*3/uL (ref 0.0–0.1)
Basophils Relative: 0.8 % (ref 0.0–3.0)
Eosinophils Absolute: 0.8 10*3/uL — ABNORMAL HIGH (ref 0.0–0.7)
Eosinophils Relative: 8 % — ABNORMAL HIGH (ref 0.0–5.0)
HCT: 39.4 % (ref 36.0–46.0)
Hemoglobin: 13.2 g/dL (ref 12.0–15.0)
Lymphocytes Relative: 41.4 % (ref 12.0–46.0)
Lymphs Abs: 3.9 10*3/uL (ref 0.7–4.0)
MCHC: 33.5 g/dL (ref 30.0–36.0)
MCV: 93.3 fl (ref 78.0–100.0)
Monocytes Absolute: 0.7 10*3/uL (ref 0.1–1.0)
Monocytes Relative: 7.4 % (ref 3.0–12.0)
Neutro Abs: 4 10*3/uL (ref 1.4–7.7)
Neutrophils Relative %: 42.4 % — ABNORMAL LOW (ref 43.0–77.0)
Platelets: 303 10*3/uL (ref 150.0–400.0)
RBC: 4.22 Mil/uL (ref 3.87–5.11)
RDW: 13.2 % (ref 11.5–15.5)
WBC: 9.4 10*3/uL (ref 4.0–10.5)

## 2014-09-19 LAB — COMPREHENSIVE METABOLIC PANEL
ALT: 28 U/L (ref 0–35)
AST: 19 U/L (ref 0–37)
Albumin: 4.3 g/dL (ref 3.5–5.2)
Alkaline Phosphatase: 67 U/L (ref 39–117)
BUN: 26 mg/dL — ABNORMAL HIGH (ref 6–23)
CO2: 34 mEq/L — ABNORMAL HIGH (ref 19–32)
Calcium: 9.3 mg/dL (ref 8.4–10.5)
Chloride: 101 mEq/L (ref 96–112)
Creatinine, Ser: 0.57 mg/dL (ref 0.40–1.20)
GFR: 117.12 mL/min (ref >60.00)
Glucose, Bld: 86 mg/dL (ref 70–99)
Potassium: 3.6 mEq/L (ref 3.5–5.1)
Sodium: 139 mEq/L (ref 135–145)
Total Bilirubin: 0.3 mg/dL (ref 0.2–1.2)
Total Protein: 6.8 g/dL (ref 6.0–8.3)

## 2014-09-19 LAB — LIPASE: Lipase: 42 U/L (ref 11.0–59.0)

## 2014-09-19 LAB — AMYLASE: Amylase: 33 U/L (ref 27–131)

## 2014-09-19 MED ORDER — PANTOPRAZOLE SODIUM 40 MG PO TBEC: 40.0000 mg | DELAYED_RELEASE_TABLET | Freq: Every day | ORAL | Status: DC

## 2014-09-19 MED ORDER — ONDANSETRON HCL 4 MG PO TABS: 4.0000 mg | ORAL_TABLET | Freq: Three times a day (TID) | ORAL | Status: DC | PRN

## 2014-09-19 NOTE — Progress Notes (Signed)
Subjective:    Patient ID: Sandra [name], female    DOB: [birth year], 54 y.o.   MRN: [id number] Cc: epigastric pain HPI Sandra [name] is here with a few week hx of intermittent epigastric pain. Can be but not always post-prandial. Can be a pulsating type pain. Was severe 3 d ago "almost went to ER". A lot of nausea and there is often a low-grade discomfort. No bowel habit changes. 1x vomited slightly from severe pain No bowel habit changes. Takes ibuprofen 1-2 x a week for kidney stone pain. No melena. No jaunidce  No Known Allergies Outpatient Prescriptions Prior to Visit  Medication Sig Dispense Refill  . Calcium Carb-Cholecalciferol (CALCIUM PLUS VITAMIN D3 PO) Take 1 tablet by mouth 3 (three) times daily.     Marland Kitchen ibuprofen (ADVIL,MOTRIN) 200 MG tablet Take 400 mg by mouth every 6 (six) hours as needed for headache or mild pain.    . Multiple Vitamins-Minerals (MULTIVITAMIN PO) Take 1 tablet by mouth 2 (two) times daily.     . nitrofurantoin, macrocrystal-monohydrate, (MACROBID) 100 MG capsule Take 1 capsule (100 mg total) by mouth 2 (two) times daily. 14 capsule 0  . PARoxetine (PAXIL-CR) 12.5 MG 24 hr tablet Take 1 tablet (12.5 mg total) by mouth every morning. 30 tablet 11  . tinidazole (TINDAMAX) 500 MG tablet Take 4 tablets today and 4 tablets tomorrow 8 tablet 0   No facility-administered medications prior to visit.   Past Medical History  Diagnosis Date  . Hx of adenomatous colonic polyps 2011    BENIGN  . History of endometrial cancer 2006 ---- S/P ABD. HYSTERECTOMY    STAGE I ENDOMETROID CARCINOMA  . CAD (coronary artery disease) NON-OBSTRUCTIVE PER DR HOCHREIN NOTE 08-26-2011  . Cancer 2006    uterine  . Complication of anesthesia PT STATES HAS LETTER FROM [place] [facility]. STATING THAT SHE HAS A VERY SMALL AIRWAY (LAST SURG. HY[facility]CTOMY AT [facility] IN 2006    PT STATES LYNN BEASON CRNA WHOM WORKS HERE TOLD SHE WOULD BE OKAY TO BE DONE AT [facility]  . Diabetes mellitus     no meds taken now    . Hypertension     no meds now  . Hyperlipidemia     no meds now  . Heart murmur     with last visit, MD states "outgrown" murmur  . OSA on CPAP     cpap setting of 16  . Sleep apnea     no CPAP since surgery  . GERD (gastroesophageal reflux disease)   . Arthritis   . Morbid obesity, weight - 203, BMI - 38.4 05/12/2013   Past Surgical History  Procedure Laterality Date  . Cesarean section  1989  . Bilateral breast reduction  11-16-1999  . Uvulopalatopharyngoplasty, tonsillectomy and septoplasty  1994  . Cystoscopy  09/06/2011    Procedure: CYSTOSCOPY;  Surgeon: Ailene Rud, MD;  Location: [facility];  Service: Urology;  Laterality: N/A;  LYNX SLING   . Pubovaginal sling  09/06/2011    Procedure: Gaynelle Arabian;  Surgeon: Ailene Rud, MD;  Location: [facility];  Service: Urology;  Laterality: N/A;  . Hemorrhoid banding  02/2013  . Breath tek h pylori N/A 05/27/2013    Procedure: BREATH TEK H PYLORI;  Surgeon: Shann Medal, MD;  Location: [facility];  Service: General;  Laterality: N/A;  . Gastric roux-en-y N/A 10/04/2013    Procedure: LAPAROSCOPIC ROUX-EN-Y GASTRIC BYPASS WITH UPPER ENDOSCOPY;  Surgeon: Fenton Malling  Lucia Gaskins, MD;  Location: [facility];  Service: General;  Laterality: N/A;  . Abdominal hysterectomy  12-20-2004    TAH  . Colonoscopy    . Colonoscopy N/A 04/25/2014    Procedure: COLONOSCOPY;  Surgeon: Gatha Mayer, MD;  Location: [facility];  Service: Endoscopy;  Laterality: N/A;   History   Social History  . Marital Status: Single    Spouse Name: N/A  . Number of Children: N/A  . Years of Education: N/A   Social History Main Topics  . Smoking status: Never Smoker   . Smokeless tobacco: Never Used  . Alcohol Use: No  . Drug Use: No  . Sexual Activity: Yes    Birth Control/ Protection: Surgical     Comment: HYST.. 1st intercourse- 13, partners- 6   Other Topics Concern  . 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 Adopted: Yes     Review of Systems As above    Objective:   Physical Exam @BP  110/56 mmHg  Pulse 72  Ht 5' 1.5" (1.562 m)  Wt 129 lb 6 oz (58.684 kg)  BMI 24.05 kg/m2@  General:  NAD Eyes:   anicteric Abdomen:  soft and nontender, BS+ Ext:   no edema, cyanosis or clubbing      Assessment & Plan:   1. Abdominal pain, epigastric   2. Nausea without vomiting   3. History of Roux-en-Y gastric bypass    Cause of problems not clear this point. Main differential in my mind is gallstones or common bile duct stones versus ulcer disease. The possibility of a problem with her gastric bypass is revealed but the symptoms are not so suggestive of an internal hernia or anything like that.  Plan is for lab evaluation with CBC seem at amylase and lipase. Start pantoprazole 40 mg daily. Treat nausea with ondansetron 4 mg daily milligrams as needed every 8 hours.  Abdominal ultrasound will be ordered. If this is unhelpful she will be scheduled for an EGD. Unless, she is markedly improved on the pantoprazole.   I appreciate the opportunity to care for this patient. CC: Stephens Shire, MD Alphonsa Overall M.D.

## 2014-09-19 NOTE — Patient Instructions (Addendum)
Your physician has requested that you go to the basement for the following lab work before leaving today: CBC, CMET, Amylase, Lipase   We have sent the following medications to your pharmacy for you to pick up at your convenience: Pantoprazole, generic zofran  You have been scheduled for an abdominal ultrasound at [facility] (1st floor of hospital) on 09/23/14 at 7:30AM. Please arrive 15 minutes prior to your appointment for registration. Make certain not to have anything to eat or drink 6 hours prior to your appointment. Should you need to reschedule your appointment, please contact radiology at [phone]. This test typically takes about 30 minutes to perform.  I appreciate the opportunity to care for you. Silvano Rusk, M.D., [facility]

## 2014-09-20 ENCOUNTER — Other Ambulatory Visit: Payer: Self-pay

## 2014-09-20 MED ORDER — HYDROCODONE-ACETAMINOPHEN 5-500 MG PO TABS: ORAL_TABLET | ORAL | Status: DC

## 2014-09-22 ENCOUNTER — Encounter (HOSPITAL_COMMUNITY): Payer: Self-pay | Admitting: *Deleted

## 2014-09-22 ENCOUNTER — Telehealth: Payer: Self-pay | Admitting: Internal Medicine

## 2014-09-22 ENCOUNTER — Other Ambulatory Visit: Payer: Self-pay

## 2014-09-22 DIAGNOSIS — R1310 Dysphagia, unspecified: Secondary | ICD-10-CM

## 2014-09-22 NOTE — Addendum Note (Signed)
Addended by: Marlon Pel on: 09/22/2014 11:26 AM   Modules accepted: Orders

## 2014-09-23 ENCOUNTER — Ambulatory Visit (HOSPITAL_COMMUNITY)
Admission: RE | Admit: 2014-09-23 | Discharge: 2014-09-23 | Disposition: A | Discharge status: Home / Self Care | Payer: 59 | Source: Ambulatory Visit | Attending: Internal Medicine | Admitting: Internal Medicine

## 2014-09-23 DIAGNOSIS — R109 Unspecified abdominal pain: Secondary | ICD-10-CM | POA: Diagnosis not present

## 2014-09-23 DIAGNOSIS — R1013 Epigastric pain: Secondary | ICD-10-CM

## 2014-09-23 DIAGNOSIS — R110 Nausea: Secondary | ICD-10-CM

## 2014-09-23 MED ORDER — HYDROCODONE-ACETAMINOPHEN 5-325 MG PO TABS: ORAL_TABLET | ORAL | Status: DC

## 2014-09-23 NOTE — Telephone Encounter (Signed)
Left Pa message on her cell that rx has been corrected and was verbally given to pharmacy yesterday.

## 2014-09-23 NOTE — Progress Notes (Signed)
Quick Note:  [place] negative = please inform Will see her at EGD Tues ______

## 2014-09-26 NOTE — Anesthesia Preprocedure Evaluation (Addendum)
Anesthesia Evaluation  Patient identified by MRN, date of birth, ID band Patient awake    Reviewed: Allergy & Precautions, NPO status , Patient's Chart, lab work & pertinent test results  History of Anesthesia Complications (+) DIFFICULT AIRWAY and history of anesthetic complications (hx of difficult airway but no records of that event, prior intubation here at [facility] long demonstrates "easy mask with oral airway" "unable to visulize vocal cords with MAC 4" "grade 1 view with glidescope")  Airway Mallampati: II  TM Distance: >3 FB Neck ROM: Full    Dental no notable dental hx. (+) Dental Advisory Given   Pulmonary sleep apnea and Continuous Positive Airway Pressure Ventilation ,  breath sounds clear to auscultation  Pulmonary exam normal       Cardiovascular hypertension, Pt. on medications + CAD Normal cardiovascular examRhythm:Regular Rate:Normal     Neuro/Psych PSYCHIATRIC DISORDERS Anxiety negative neurological ROS     GI/Hepatic Neg liver ROS, GERD-  Medicated and Controlled,  Endo/Other  diabetes  Renal/GU negative Renal ROS  negative genitourinary   Musculoskeletal  (+) Arthritis -, Osteoarthritis,    Abdominal   Peds negative pediatric ROS (+)  Hematology negative hematology ROS (+)   Anesthesia Other Findings   Reproductive/Obstetrics negative OB ROS                            Anesthesia Physical Anesthesia Plan  ASA: III  Anesthesia Plan: MAC   Post-op Pain Management:    Induction: Intravenous  Airway Management Planned: Nasal Cannula  Additional Equipment:   Intra-op Plan:   Post-operative Plan:   Informed Consent: I have reviewed the patients History and Physical, chart, labs and discussed the procedure including the risks, benefits and alternatives for the proposed anesthesia with the patient or authorized representative who has indicated his/her understanding and  acceptance.   Dental advisory given  Plan Discussed with: CRNA  Anesthesia Plan Comments:         Anesthesia Quick Evaluation

## 2014-09-27 ENCOUNTER — Encounter (HOSPITAL_COMMUNITY)
Admission: RE | Disposition: A | Discharge status: Home / Self Care | Payer: Self-pay | Source: Ambulatory Visit | Attending: Internal Medicine

## 2014-09-27 ENCOUNTER — Encounter (HOSPITAL_COMMUNITY): Payer: Self-pay | Admitting: Gastroenterology

## 2014-09-27 ENCOUNTER — Ambulatory Visit (HOSPITAL_COMMUNITY)
Admission: RE | Admit: 2014-09-27 | Discharge: 2014-09-27 | Disposition: A | Discharge status: Home / Self Care | Payer: 59 | Source: Ambulatory Visit | Attending: Internal Medicine | Admitting: Internal Medicine

## 2014-09-27 ENCOUNTER — Ambulatory Visit (HOSPITAL_COMMUNITY): Payer: 59 | Admitting: Anesthesiology

## 2014-09-27 DIAGNOSIS — E785 Hyperlipidemia, unspecified: Secondary | ICD-10-CM | POA: Diagnosis not present

## 2014-09-27 DIAGNOSIS — Z79899 Other long term (current) drug therapy: Secondary | ICD-10-CM | POA: Insufficient documentation

## 2014-09-27 DIAGNOSIS — M1990 Unspecified osteoarthritis, unspecified site: Secondary | ICD-10-CM | POA: Diagnosis not present

## 2014-09-27 DIAGNOSIS — K289 Gastrojejunal ulcer, unspecified as acute or chronic, without hemorrhage or perforation: Secondary | ICD-10-CM | POA: Diagnosis not present

## 2014-09-27 DIAGNOSIS — Z86010 Personal history of colonic polyps: Secondary | ICD-10-CM | POA: Insufficient documentation

## 2014-09-27 DIAGNOSIS — I10 Essential (primary) hypertension: Secondary | ICD-10-CM | POA: Diagnosis not present

## 2014-09-27 DIAGNOSIS — Z791 Long term (current) use of non-steroidal anti-inflammatories (NSAID): Secondary | ICD-10-CM | POA: Insufficient documentation

## 2014-09-27 DIAGNOSIS — G4733 Obstructive sleep apnea (adult) (pediatric): Secondary | ICD-10-CM | POA: Insufficient documentation

## 2014-09-27 DIAGNOSIS — I2510 Atherosclerotic heart disease of native coronary artery without angina pectoris: Secondary | ICD-10-CM | POA: Insufficient documentation

## 2014-09-27 DIAGNOSIS — Z8542 Personal history of malignant neoplasm of other parts of uterus: Secondary | ICD-10-CM | POA: Insufficient documentation

## 2014-09-27 DIAGNOSIS — R1013 Epigastric pain: Secondary | ICD-10-CM | POA: Diagnosis present

## 2014-09-27 DIAGNOSIS — Z9884 Bariatric surgery status: Secondary | ICD-10-CM | POA: Diagnosis not present

## 2014-09-27 DIAGNOSIS — K219 Gastro-esophageal reflux disease without esophagitis: Secondary | ICD-10-CM | POA: Diagnosis not present

## 2014-09-27 DIAGNOSIS — R1310 Dysphagia, unspecified: Secondary | ICD-10-CM

## 2014-09-27 DIAGNOSIS — F419 Anxiety disorder, unspecified: Secondary | ICD-10-CM | POA: Diagnosis not present

## 2014-09-27 DIAGNOSIS — E119 Type 2 diabetes mellitus without complications: Secondary | ICD-10-CM | POA: Diagnosis not present

## 2014-09-27 HISTORY — DX: Other hemorrhoids: K64.8

## 2014-09-27 HISTORY — DX: Gastrojejunal ulcer, unspecified as acute or chronic, without hemorrhage or perforation: K28.9

## 2014-09-27 HISTORY — PX: ESOPHAGOGASTRODUODENOSCOPY (EGD) WITH PROPOFOL: SHX5813

## 2014-09-27 HISTORY — DX: Anxiety disorder, unspecified: F41.9

## 2014-09-27 SURGERY — ESOPHAGOGASTRODUODENOSCOPY (EGD) WITH PROPOFOL
Anesthesia: Monitor Anesthesia Care

## 2014-09-27 MED ORDER — LACTATED RINGERS IV SOLN
INTRAVENOUS | Status: DC
  Administered 2014-09-27: 1000 mL via INTRAVENOUS

## 2014-09-27 MED ORDER — PROPOFOL 10 MG/ML IV BOLUS
INTRAVENOUS | Status: DC | PRN
  Administered 2014-09-27: 80 mg via INTRAVENOUS

## 2014-09-27 MED ORDER — LIDOCAINE HCL (CARDIAC) 20 MG/ML IV SOLN
INTRAVENOUS | Status: AC
  Filled 2014-09-27: qty 5

## 2014-09-27 MED ORDER — SUCRALFATE 1 G PO TABS: 1.0000 g | ORAL_TABLET | Freq: Three times a day (TID) | ORAL | Status: DC

## 2014-09-27 MED ORDER — PROPOFOL INFUSION 10 MG/ML OPTIME
INTRAVENOUS | Status: DC | PRN
  Administered 2014-09-27: 120 ug/kg/min via INTRAVENOUS

## 2014-09-27 MED ORDER — PROPOFOL 10 MG/ML IV BOLUS
INTRAVENOUS | Status: AC
  Filled 2014-09-27: qty 20

## 2014-09-27 MED ORDER — LIDOCAINE HCL (CARDIAC) 20 MG/ML IV SOLN
INTRAVENOUS | Status: DC | PRN
  Administered 2014-09-27: 50 mg via INTRAVENOUS

## 2014-09-27 MED ORDER — SODIUM CHLORIDE 0.9 % IV SOLN: INTRAVENOUS | Status: DC

## 2014-09-27 SURGICAL SUPPLY — 14 items

## 2014-09-27 NOTE — Transfer of Care (Signed)
Immediate Anesthesia Transfer of Care Note  Patient: Sandra [name]  Procedure(s) Performed: Procedure(s): ESOPHAGOGASTRODUODENOSCOPY (EGD) WITH PROPOFOL (N/A)  Patient Location: PACU  Anesthesia Type:MAC  Level of Consciousness: awake, alert  and oriented  Airway & Oxygen Therapy: Patient Spontanous Breathing and Patient connected to face mask oxygen  Post-op Assessment: Report given to RN and Post -op Vital signs reviewed and stable  Post vital signs: Reviewed and stable  Last Vitals:  Filed Vitals:   09/27/14 0640  BP: 116/82  Pulse: 54  Temp: 36.7 C  Resp: 8    Complications: No apparent anesthesia complications

## 2014-09-27 NOTE — Anesthesia Postprocedure Evaluation (Signed)
  Anesthesia Post-op Note  Patient: Sandra [name]  Procedure(s) Performed: Procedure(s) (LRB): ESOPHAGOGASTRODUODENOSCOPY (EGD) WITH PROPOFOL (N/A)  Patient Location: PACU  Anesthesia Type: MAC  Level of Consciousness: awake and alert   Airway and Oxygen Therapy: Patient Spontanous Breathing  Post-op Pain: mild  Post-op Assessment: Post-op Vital signs reviewed, Patient's Cardiovascular Status Stable, Respiratory Function Stable, Patent Airway and No signs of Nausea or vomiting  Last Vitals:  Filed Vitals:   09/27/14 0756  BP: 127/96  Pulse: 63  Temp:   Resp: 19    Post-op Vital Signs: stable   Complications: No apparent anesthesia complications

## 2014-09-27 NOTE — H&P (Signed)
Sandra [name] (MR# [id number])      Progress Notes Info    Author Note Status Last Update User Last Update Date/Time   Gatha Mayer, MD Signed Gatha Mayer, MD 09/19/2014 9:42 PM    Progress Notes    Expand All Collapse All     Subjective:    Patient ID: Sandra [name], female DOB: 03/22/1961, 54 y.o. MRN: [id number] Cc: epigastric pain HPI Cianna is here with a few week hx of intermittent epigastric pain. Can be but not always post-prandial. Can be a pulsating type pain. Was severe 3 d ago "almost went to ER". A lot of nausea and there is often a low-grade discomfort. No bowel habit changes. 1x vomited slightly from severe pain No bowel habit changes. Takes ibuprofen 1-2 x a week for kidney stone pain. No melena. No jaunidce  No Known Allergies Outpatient Prescriptions Prior to Visit  Medication Sig Dispense Refill  . Calcium Carb-Cholecalciferol (CALCIUM PLUS VITAMIN D3 PO) Take 1 tablet by mouth 3 (three) times daily.     Marland Kitchen ibuprofen (ADVIL,MOTRIN) 200 MG tablet Take 400 mg by mouth every 6 (six) hours as needed for headache or mild pain.    . Multiple Vitamins-Minerals (MULTIVITAMIN PO) Take 1 tablet by mouth 2 (two) times daily.     . nitrofurantoin, macrocrystal-monohydrate, (MACROBID) 100 MG capsule Take 1 capsule (100 mg total) by mouth 2 (two) times daily. 14 capsule 0  . PARoxetine (PAXIL-CR) 12.5 MG 24 hr tablet Take 1 tablet (12.5 mg total) by mouth every morning. 30 tablet 11  . tinidazole (TINDAMAX) 500 MG tablet Take 4 tablets today and 4 tablets tomorrow 8 tablet 0   No facility-administered medications prior to visit.   Past Medical History  Diagnosis Date  . Hx of adenomatous colonic polyps 2011    BENIGN  . History of endometrial cancer 2006 ---- S/P ABD. HYSTERECTOMY    STAGE I ENDOMETROID CARCINOMA  . CAD (coronary artery disease) NON-OBSTRUCTIVE PER DR HOCHREIN NOTE 08-26-2011  .  Cancer 2006    uterine  . Complication of anesthesia PT STATES HAS LETTER FROM [place] [facility]. STATING THAT SHE HAS A VERY SMALL AIRWAY (LAST SURG. HYSTERECTOMY AT [facility] IN 2006    PT STATES LYNN BEASON CRNA WHOM WORKS HERE TOLD SHE WOULD BE OKAY TO BE DONE AT [facility]  . Diabetes mellitus     no meds taken now  . Hypertension     no meds now  . Hyperlipidemia     no meds now  . Heart murmur     with last visit, MD states "outgrown" murmur  . OSA on CPAP     cpap setting of 16  . Sleep apnea     no CPAP since surgery  . GERD (gastroesophageal reflux disease)   . Arthritis   . Morbid obesity, weight - 203, BMI - 38.4 05/12/2013   Past Surgical History  Procedure Laterality Date  . Cesarean section  1989  . Bilateral breast reduction  11-16-1999  . Uvulopalatopharyngoplasty, tonsillectomy and septoplasty  1994  . Cystoscopy  09/06/2011    Procedure: CYSTOSCOPY; Surgeon: Ailene Rud, MD; Location: [facility]; Service: Urology; Laterality: N/A; LYNX SLING   . Pubovaginal sling  09/06/2011    Procedure: Gaynelle Arabian; Surgeon: Ailene Rud, MD; Location: [facility]; Service: Urology; Laterality: N/A;  . Hemorrhoid banding  02/2013  . Breath tek h pylori N/A 05/27/2013  Procedure: BREATH TEK H PYLORI; Surgeon: Shann Medal, MD; Location: [facility]; Service: General; Laterality: N/A;  . Gastric roux-en-y N/A 10/04/2013    Procedure: LAPAROSCOPIC ROUX-EN-Y GASTRIC BYPASS WITH UPPER ENDOSCOPY; Surgeon: Shann Medal, MD; Location: [facility]; Service: General; Laterality: N/A;  . Abdominal hysterectomy  12-20-2004    TAH  . Colonoscopy    . Colonoscopy N/A 04/25/2014    Procedure: COLONOSCOPY; Surgeon: Gatha Mayer, MD; Location: [facility]; Service: Endoscopy; Laterality: N/A;   History    Social History  . Marital Status: Single    Spouse Name: N/A  . Number of Children: N/A  . Years of Education: N/A   Social History Main Topics  . Smoking status: Never Smoker   . Smokeless tobacco: Never Used  . Alcohol Use: No  . Drug Use: No  . Sexual Activity: Yes    Birth Control/ Protection: Surgical     Comment: HYST.. 1st intercourse- 13, partners- 6   Other Topics Concern  . 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 Adopted: Yes     Review of Systems As above    Objective:   Physical Exam @BP  110/56 mmHg  Pulse 72  Ht 5' 1.5" (1.562 m)  Wt 129 lb 6 oz (58.684 kg)  BMI 24.05 kg/m2@  General: NAD Eyes: anicteric Abdomen: soft and nontender, BS+ Ext: no edema, cyanosis or clubbing      Assessment & Plan:   1. Abdominal pain, epigastric   2. Nausea without vomiting   3. History of Roux-en-Y gastric bypass    Cause of problems not clear this point. Main differential in my mind is gallstones or common bile duct stones versus ulcer disease. The possibility of a problem with her gastric bypass is revealed but the symptoms are not so suggestive of an internal hernia or anything like that.  Plan is for lab evaluation with CBC seem at amylase and lipase. Start pantoprazole 40 mg daily. Treat nausea with ondansetron 4 mg daily milligrams as needed every 8 hours.  Abdominal ultrasound will be ordered. If this is unhelpful she will be scheduled for an EGD. Unless, she is markedly improved on the pantoprazole.   I appreciate the opportunity to care for this patient. CC: Stephens Shire, MD Alphonsa Overall M.D.    [place] and labs negative.  Proceeding with EGD  Hx and PE otherwise  unchanged.  Gatha Mayer, MD, [facility] [phone] (pager) 09/27/2014 7:32 AM

## 2014-09-27 NOTE — Discharge Instructions (Signed)
° °  I found an ulcer at the small bowel side of the anastomosis. Biopsies taken though think its benign. Am adding carafate to treatment regimen.  Please avoid ibuprofen, naproxen, aspirin, etc. Acetaminophen and Vicodin ok.  I will let you know results and plans soon.  I appreciate the opportunity to care for you. Gatha Mayer, MD, FACG   YOU HAD AN ENDOSCOPIC PROCEDURE TODAY: Refer to the procedure report and other information in the discharge instructions given to you for any specific questions about what was found during the examination. If this information does not answer your questions, please call Dr. Celesta Aver office at [phone] to clarify.   YOU SHOULD EXPECT: Some feelings of bloating in the abdomen. Passage of more gas than usual. Walking can help get rid of the air that was put into your GI tract during the procedure and reduce the bloating. If you had a lower endoscopy (such as a colonoscopy or flexible sigmoidoscopy) you may notice spotting of blood in your stool or on the toilet paper. Some abdominal soreness may be present for a day or two, also.  DIET: Your first meal following the procedure should be a light meal and then it is ok to progress to your normal diet. A half-sandwich or bowl of soup is an example of a good first meal. Heavy or fried foods are harder to digest and may make you feel nauseous or bloated. Drink plenty of fluids but you should avoid alcoholic beverages for 24 hours.   ACTIVITY: Your care partner should take you home directly after the procedure. You should plan to take it easy, moving slowly for the rest of the day. You can resume normal activity the day after the procedure however YOU SHOULD NOT DRIVE, use power tools, machinery or perform tasks that involve climbing or major physical exertion for 24 hours (because of the sedation medicines used during the test).   SYMPTOMS TO REPORT IMMEDIATELY: A gastroenterologist can be reached at any  hour. Please call [phone]  for any of the following symptoms:   Following upper endoscopy (EGD, EUS, ERCP, esophageal dilation) Vomiting of blood or coffee ground material  New, significant abdominal pain  New, significant chest pain or pain under the shoulder blades  Painful or persistently difficult swallowing  New shortness of breath  Black, tarry-looking or red, bloody stools

## 2014-09-27 NOTE — Op Note (Signed)
[facility] [place], [id number]   ENDOSCOPY PROCEDURE REPORT  PATIENT: Sandra [name], Sandra [name]  MR#: [id number] BIRTHDATE: 02-14-1961 , 48  yrs. old GENDER: female ENDOSCOPIST: Gatha Mayer, MD, [facility] PROCEDURE DATE:  09/27/2014 PROCEDURE:  EGD w/ biopsy ASA CLASS:     Class II INDICATIONS:  epigastric pain. MEDICATIONS: Monitored anesthesia care and Per Anesthesia TOPICAL ANESTHETIC: none  DESCRIPTION OF PROCEDURE: After the risks benefits and alternatives of the procedure were thoroughly explained, informed consent was obtained.  The Pentax Gastroscope [id number] endoscope was introduced through the mouth and advanced to the second portion of the duodenum , Without limitations.  The instrument was slowly withdrawn as the mucosa was fully examined.    1) Medium-sized clean based irregular ulcer at small bowel side of gastrojejunostomy.  Biopsies taken. 2) Otherwise normal anatomy s/p bariatric laparoscopic roux-en-Y bypass. 3) Z-line at 40 cm and gastro-jejunal anastomosis at 44 cm.  retroflexion possible. Retroflexed views revealed no abnormalities.     The scope was then withdrawn from the patient and the procedure completed.  COMPLICATIONS: There were no immediate complications.  ENDOSCOPIC IMPRESSION: 1) Medium-sized clean based irregular ulcer at small bowel side of gastrojejunostomy.  Biopsies taken. 2) Otherwise normal anatomy s/p bariatric laparoscopic roux-en-Y bypass. 3) Z-line at 40 cm and gastro-jejunal anastomosis at 44 cm. retroflexion possible  RECOMMENDATIONS: 1) Add carafate 1 g qid to saily PPI 2) avoid NSAID's and ASA (minimal use in past) 3) Await biopsies and will call her   eSigned:  Gatha Mayer, MD, [facility] 09/27/2014 8:04 AM    CC:The Patient, Alphonsa Overall, MD and Juanita Craver, MD

## 2014-09-28 ENCOUNTER — Encounter (HOSPITAL_COMMUNITY): Payer: Self-pay | Admitting: Internal Medicine

## 2014-09-30 NOTE — Progress Notes (Signed)
Quick Note:  Patient informed No recall or letter needed Stay on PPI and carafate for now - clinically improving ______

## 2015-05-08 MED FILL — PAROXETINE CR 12.5 MG TAB: 12.5 | 30 days supply | Qty: 30 | Fill #2

## 2015-06-09 MED FILL — PAROXETINE CR 12.5 MG TAB: 12.5 | 30 days supply | Qty: 30 | Fill #3

## 2015-06-12 ENCOUNTER — Other Ambulatory Visit: Payer: Self-pay

## 2015-06-12 DIAGNOSIS — Z1231 Encounter for screening mammogram for malignant neoplasm of breast: Secondary | ICD-10-CM

## 2015-06-26 ENCOUNTER — Encounter: Payer: 59 | Admitting: Gynecology

## 2015-06-29 ENCOUNTER — Encounter: Payer: 59 | Admitting: Gynecology

## 2015-06-29 ENCOUNTER — Ambulatory Visit: Payer: 59

## 2015-07-13 ENCOUNTER — Encounter: Payer: 59 | Admitting: Gynecology

## 2015-07-18 ENCOUNTER — Other Ambulatory Visit: Payer: Self-pay | Admitting: Gynecology

## 2015-07-18 MED FILL — PAROXETINE CR 12.5 MG TAB: 12.5 | 30 days supply | Qty: 30 | Fill #0

## 2015-07-31 ENCOUNTER — Other Ambulatory Visit: Payer: Self-pay | Admitting: Internal Medicine

## 2015-07-31 ENCOUNTER — Ambulatory Visit: Payer: 59

## 2015-07-31 MED FILL — ONDANSETRON HCL 4 MG TABLET: 4 | 5 days supply | Qty: 30 | Fill #0

## 2015-08-11 ENCOUNTER — Ambulatory Visit: Payer: 59

## 2015-08-28 ENCOUNTER — Encounter: Payer: 59 | Admitting: Gynecology

## 2015-09-18 ENCOUNTER — Other Ambulatory Visit: Payer: Self-pay | Admitting: Family Medicine

## 2015-09-28 ENCOUNTER — Ambulatory Visit
Admission: RE | Admit: 2015-09-28 | Discharge: 2015-09-28 | Disposition: A | Discharge status: Home / Self Care | Payer: 59 | Source: Ambulatory Visit

## 2015-09-28 DIAGNOSIS — Z1231 Encounter for screening mammogram for malignant neoplasm of breast: Secondary | ICD-10-CM

## 2015-10-05 ENCOUNTER — Ambulatory Visit (INDEPENDENT_AMBULATORY_CARE_PROVIDER_SITE_OTHER): Payer: 59 | Admitting: Gynecology

## 2015-10-05 ENCOUNTER — Encounter: Payer: Self-pay | Admitting: Gynecology

## 2015-10-05 VITALS — BP 128/84 | Ht 62.5 in | Wt 141.0 lb

## 2015-10-05 DIAGNOSIS — Z9889 Other specified postprocedural states: Secondary | ICD-10-CM

## 2015-10-05 DIAGNOSIS — Z01419 Encounter for gynecological examination (general) (routine) without abnormal findings: Secondary | ICD-10-CM | POA: Diagnosis not present

## 2015-10-05 DIAGNOSIS — Z113 Encounter for screening for infections with a predominantly sexual mode of transmission: Secondary | ICD-10-CM

## 2015-10-05 DIAGNOSIS — H5213 Myopia, bilateral: Secondary | ICD-10-CM | POA: Diagnosis not present

## 2015-10-05 DIAGNOSIS — H524 Presbyopia: Secondary | ICD-10-CM | POA: Diagnosis not present

## 2015-10-05 DIAGNOSIS — H2513 Age-related nuclear cataract, bilateral: Secondary | ICD-10-CM | POA: Diagnosis not present

## 2015-10-05 DIAGNOSIS — H25013 Cortical age-related cataract, bilateral: Secondary | ICD-10-CM | POA: Diagnosis not present

## 2015-10-05 DIAGNOSIS — Z9884 Bariatric surgery status: Secondary | ICD-10-CM

## 2015-10-05 DIAGNOSIS — Z8542 Personal history of malignant neoplasm of other parts of uterus: Secondary | ICD-10-CM | POA: Diagnosis not present

## 2015-10-05 LAB — CBC WITH DIFFERENTIAL/PLATELET
Basophils Absolute: 61 cells/uL (ref 0–200)
Basophils Relative: 1 %
Eosinophils Absolute: 244 cells/uL (ref 15–500)
Eosinophils Relative: 4 %
HCT: 44 % (ref 35.0–45.0)
Hemoglobin: 14.4 g/dL (ref 11.7–15.5)
Lymphocytes Relative: 38 %
Lymphs Abs: 2318 cells/uL (ref 850–3900)
MCH: 30.3 pg (ref 27.0–33.0)
MCHC: 32.7 g/dL (ref 32.0–36.0)
MCV: 92.4 fL (ref 80.0–100.0)
MPV: 9.7 fL (ref 7.5–12.5)
Monocytes Absolute: 427 cells/uL (ref 200–950)
Monocytes Relative: 7 %
Neutro Abs: 3050 cells/uL (ref 1500–7800)
Neutrophils Relative %: 50 %
Platelets: 238 10*3/uL (ref 140–400)
RBC: 4.76 MIL/uL (ref 3.80–5.10)
RDW: 13.3 % (ref 11.0–15.0)
WBC: 6.1 10*3/uL (ref 3.8–10.8)

## 2015-10-05 LAB — COMPREHENSIVE METABOLIC PANEL
ALT: 20 U/L (ref 6–29)
AST: 18 U/L (ref 10–35)
Albumin: 4.2 g/dL (ref 3.6–5.1)
Alkaline Phosphatase: 50 U/L (ref 33–130)
BUN: 21 mg/dL (ref 7–25)
CO2: 26 mmol/L (ref 20–31)
Calcium: 9.2 mg/dL (ref 8.6–10.4)
Chloride: 104 mmol/L (ref 98–110)
Creat: 0.56 mg/dL (ref 0.50–1.05)
Glucose, Bld: 102 mg/dL — ABNORMAL HIGH (ref 65–99)
Potassium: 3.7 mmol/L (ref 3.5–5.3)
Sodium: 144 mmol/L (ref 135–146)
Total Bilirubin: 0.4 mg/dL (ref 0.2–1.2)
Total Protein: 6.6 g/dL (ref 6.1–8.1)

## 2015-10-05 LAB — LIPID PANEL
Cholesterol: 154 mg/dL (ref 125–200)
HDL: 52 mg/dL (ref >=46)
LDL Cholesterol: 76 mg/dL (ref <130)
Total CHOL/HDL Ratio: 3 Ratio (ref <=5.0)
Triglycerides: 129 mg/dL (ref <150)
VLDL: 26 mg/dL (ref <30)

## 2015-10-05 LAB — TSH: TSH: 2.44 mIU/L

## 2015-10-05 MED ORDER — PAROXETINE HCL ER 12.5 MG PO TB24: 12.5000 mg | ORAL_TABLET | Freq: Every morning | ORAL | Status: DC

## 2015-10-05 NOTE — Progress Notes (Signed)
Sandra [name] 1961/03/05 [id number]   History:    55 y.o.  for annual gyn exam with no complaints today. She was seen 3 months ago and had a full STD screen because her partner had been unfaithful. She would like to have an HIV test for follow-up today as well as her fasting blood work since she has not seen her primary care physician in over a year.  Review of her record indicated that in 2006 she had a total abdominal hysterectomy as a result of menorrhagia and a preoperative endometrial biopsy which had demonstrated complex hyperplasia without atypia. 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 No invasion was identified. The cervix was negative for carcinoma and she had leiomyomatous uteri as well. The case had been discussed with the GYN oncologist (Dr. Threasa Heads) at the [facility]) and had recommended no further intervention at that time and she had been followed closely every 6 months and has been cancer free since then. She is getting annual CA 125 along with a pelvic ultrasound.patient would know prior history of abnormal Pap smears.  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 Alphonsa Overall. Patient had a colonoscopy in 2015 and benign polyps were removed and she is on a 5 year recall.  Past medical history,surgical history, family history and social history were all reviewed and documented in the EPIC chart.  Gynecologic History No LMP recorded. Patient has had a hysterectomy. Contraception: status post hysterectomy Last Pap: 2016. Results were: normal Last mammogram: 2017. Results were: normal  Obstetric History OB History  Gravida Para Term Preterm AB SAB TAB Ectopic Multiple Living  3 2   1 1    2     # Outcome Date GA Lbr Len/2nd Weight Sex Delivery Anes PTL Lv  3 SAB           2 Para            1 Para                ROS: A ROS was performed and pertinent positives and negatives are included in the history.  GENERAL: No fevers or chills. HEENT: No change in vision, no earache, sore throat or sinus congestion. NECK: No pain or stiffness. CARDIOVASCULAR: No chest pain or pressure. No palpitations. PULMONARY: No shortness of breath, cough or wheeze. GASTROINTESTINAL: No abdominal pain, nausea, vomiting or diarrhea, melena or bright red blood per rectum. GENITOURINARY: No urinary frequency, urgency, hesitancy or dysuria. MUSCULOSKELETAL: No joint or muscle pain, no back pain, no recent trauma. DERMATOLOGIC: No rash, no itching, no lesions. ENDOCRINE: No polyuria, polydipsia, no heat or cold intolerance. No recent change in weight. HEMATOLOGICAL: No anemia or easy bruising or bleeding. NEUROLOGIC: No headache, seizures, numbness, tingling or weakness. PSYCHIATRIC: No depression, no loss of interest in normal activity or change in sleep pattern.     Exam: chaperone present  BP 128/84 mmHg  Ht 5' 2.5" (1.588 m)  Wt 141 lb (63.957 kg)  BMI 25.36 kg/m2  Body mass index is 25.36 kg/(m^2).  General appearance : Well developed well nourished female. No acute distress HEENT: Eyes: no retinal hemorrhage or exudates,  Neck supple, trachea midline, no carotid bruits, no thyroidmegaly Lungs: Clear to auscultation, no rhonchi or wheezes, or rib retractions  Heart: Regular rate and rhythm, no murmurs or gallops Breast:Examined  in sitting and supine position were symmetrical in appearance, no palpable masses or tenderness,  no skin retraction, no nipple inversion, no nipple discharge, no skin discoloration, no axillary or supraclavicular lymphadenopathy Abdomen: no palpable masses or tenderness, no rebound or guarding Extremities: no edema or skin discoloration or tenderness  Pelvic:  Bartholin, Urethra, Skene Glands: Within normal limits             Vagina: No gross lesions or  discharge  Cervix: Absent  Uterus  absent  Adnexa  Without masses or tenderness  Anus and perineum  normal   Rectovaginal  normal sphincter tone without palpated masses or tenderness             Hemoccult cards will be provided     Assessment/Plan:  55 y.o. female for annual exam because of patient's history of uterine cancer a Pap smear was done today. Along with her fasting blood work consisting of comprehensive metabolic panel, fasting lipid profile, TSH, CBC, and urinalysis a CA 125 will be drawn today. Patient will return back to the office next week for pelvic ultrasound to better assess her ovaries. An HIV test was done today as per patient's request as well since her partner 3 months ago was unfaithful with an individual who was a drug addict. She was reminded take her calcium and vitamin D continue her weightbearing exercises for osteoporosis prevention. Patient with no vasomotor symptoms on no hormone replacement therapy.   Terrance Mass MD, 9:10 AM 10/05/2015

## 2015-10-06 LAB — URINALYSIS W MICROSCOPIC + REFLEX CULTURE
Bilirubin Urine: NEGATIVE
Casts: NONE SEEN [LPF]
Glucose, UA: NEGATIVE
Hgb urine dipstick: NEGATIVE
Ketones, ur: NEGATIVE
Nitrite: NEGATIVE
Protein, ur: NEGATIVE
Specific Gravity, Urine: 1.024 (ref 1.001–1.035)
Yeast: NONE SEEN [HPF]
pH: 6 (ref 5.0–8.0)

## 2015-10-06 LAB — CA 125: CA 125: 7 U/mL (ref <35)

## 2015-10-06 LAB — VITAMIN D 25 HYDROXY (VIT D DEFICIENCY, FRACTURES): Vit D, 25-Hydroxy: 42 ng/mL (ref 30–100)

## 2015-10-06 LAB — HIV ANTIBODY (ROUTINE TESTING W REFLEX): HIV 1&2 Ab, 4th Generation: NONREACTIVE

## 2015-10-07 LAB — URINE CULTURE
Colony Count: NO GROWTH
Organism ID, Bacteria: NO GROWTH

## 2015-10-09 LAB — PAP IG W/ RFLX HPV ASCU

## 2015-10-30 DIAGNOSIS — F331 Major depressive disorder, recurrent, moderate: Secondary | ICD-10-CM | POA: Diagnosis not present

## 2015-11-01 ENCOUNTER — Ambulatory Visit (INDEPENDENT_AMBULATORY_CARE_PROVIDER_SITE_OTHER): Payer: 59 | Admitting: Gynecology

## 2015-11-01 ENCOUNTER — Ambulatory Visit (INDEPENDENT_AMBULATORY_CARE_PROVIDER_SITE_OTHER): Payer: 59

## 2015-11-01 ENCOUNTER — Encounter: Payer: Self-pay | Admitting: Gynecology

## 2015-11-01 DIAGNOSIS — Z8542 Personal history of malignant neoplasm of other parts of uterus: Secondary | ICD-10-CM | POA: Diagnosis not present

## 2015-11-01 MED FILL — PAROXETINE CR 12.5 MG TAB: 12.5 | 30 days supply | Qty: 30 | Fill #0

## 2015-11-01 NOTE — Progress Notes (Signed)
   Patient is a 55 year old who presented to the office today for her annual ultrasound screening. Patient was seen in the office on June 8 for annual exam.Review of her record indicated that in 2006 she had a total abdominal hysterectomy as a result of menorrhagia and a preoperative endometrial biopsy which had demonstrated complex hyperplasia without atypia. 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 No invasion was identified. The cervix was negative for carcinoma and she had leiomyomatous uteri as well. The case had been discussed with the GYN oncologist (Dr. Threasa Heads) at the [facility]) and had recommended no further intervention at that time and she had been followed closely every 6 months and has been cancer free since then. She is getting annual CA 125 along with a pelvic ultrasound.patient patient was no prior history of any abnormal Pap smears.  Her recent blood work consisting of conference metabolic panel, fasting lipid profile, vitamin D level, CBC, TSH, urinalysis and CA 125 within the normal range. Pap smear 2016 normal with the exception of yeast.  Ultrasound today: Absent uterus, right and left ovary normal. 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 No invasion was identified. The cervix was negative for carcinoma and she had leiomyomatous uteri as well. (2006) with no evidence of recurrent disease doing well otherwise. Patient to follow-up in one year for annual exam or when necessary.

## 2015-11-10 DIAGNOSIS — F331 Major depressive disorder, recurrent, moderate: Secondary | ICD-10-CM | POA: Diagnosis not present

## 2015-12-15 DIAGNOSIS — F331 Major depressive disorder, recurrent, moderate: Secondary | ICD-10-CM | POA: Diagnosis not present

## 2016-01-03 MED FILL — PAROXETINE CR 12.5 MG TAB: 12.5 | 30 days supply | Qty: 30 | Fill #1

## 2016-01-20 DIAGNOSIS — F331 Major depressive disorder, recurrent, moderate: Secondary | ICD-10-CM | POA: Diagnosis not present

## 2016-02-14 MED FILL — PAROXETINE CR 12.5 MG TAB: 12.5 | 30 days supply | Qty: 30 | Fill #2

## 2016-03-10 ENCOUNTER — Encounter (HOSPITAL_COMMUNITY): Payer: Self-pay | Admitting: Emergency Medicine

## 2016-03-10 ENCOUNTER — Inpatient Hospital Stay (HOSPITAL_COMMUNITY)
Admission: EM | Admit: 2016-03-10 | Discharge: 2016-03-12 | DRG: 378 | Disposition: A | Discharge status: Home / Self Care | Payer: 59 | Attending: Family Medicine | Admitting: Family Medicine

## 2016-03-10 DIAGNOSIS — K284 Chronic or unspecified gastrojejunal ulcer with hemorrhage: Principal | ICD-10-CM | POA: Diagnosis present

## 2016-03-10 DIAGNOSIS — K289 Gastrojejunal ulcer, unspecified as acute or chronic, without hemorrhage or perforation: Secondary | ICD-10-CM | POA: Diagnosis not present

## 2016-03-10 DIAGNOSIS — K922 Gastrointestinal hemorrhage, unspecified: Secondary | ICD-10-CM

## 2016-03-10 DIAGNOSIS — K921 Melena: Secondary | ICD-10-CM | POA: Diagnosis not present

## 2016-03-10 DIAGNOSIS — I2510 Atherosclerotic heart disease of native coronary artery without angina pectoris: Secondary | ICD-10-CM | POA: Diagnosis present

## 2016-03-10 DIAGNOSIS — K219 Gastro-esophageal reflux disease without esophagitis: Secondary | ICD-10-CM | POA: Diagnosis present

## 2016-03-10 DIAGNOSIS — Y92009 Unspecified place in unspecified non-institutional (private) residence as the place of occurrence of the external cause: Secondary | ICD-10-CM

## 2016-03-10 DIAGNOSIS — T39315A Adverse effect of propionic acid derivatives, initial encounter: Secondary | ICD-10-CM | POA: Diagnosis present

## 2016-03-10 DIAGNOSIS — M542 Cervicalgia: Secondary | ICD-10-CM | POA: Diagnosis present

## 2016-03-10 DIAGNOSIS — D72829 Elevated white blood cell count, unspecified: Secondary | ICD-10-CM | POA: Diagnosis present

## 2016-03-10 DIAGNOSIS — Z9884 Bariatric surgery status: Secondary | ICD-10-CM | POA: Diagnosis not present

## 2016-03-10 DIAGNOSIS — D62 Acute posthemorrhagic anemia: Secondary | ICD-10-CM

## 2016-03-10 DIAGNOSIS — E860 Dehydration: Secondary | ICD-10-CM | POA: Diagnosis not present

## 2016-03-10 DIAGNOSIS — Z980 Intestinal bypass and anastomosis status: Secondary | ICD-10-CM | POA: Diagnosis not present

## 2016-03-10 DIAGNOSIS — E7800 Pure hypercholesterolemia, unspecified: Secondary | ICD-10-CM

## 2016-03-10 DIAGNOSIS — E876 Hypokalemia: Secondary | ICD-10-CM | POA: Diagnosis present

## 2016-03-10 DIAGNOSIS — K920 Hematemesis: Secondary | ICD-10-CM | POA: Diagnosis not present

## 2016-03-10 DIAGNOSIS — K259 Gastric ulcer, unspecified as acute or chronic, without hemorrhage or perforation: Secondary | ICD-10-CM | POA: Diagnosis not present

## 2016-03-10 DIAGNOSIS — Z8542 Personal history of malignant neoplasm of other parts of uterus: Secondary | ICD-10-CM | POA: Diagnosis not present

## 2016-03-10 DIAGNOSIS — D500 Iron deficiency anemia secondary to blood loss (chronic): Secondary | ICD-10-CM | POA: Diagnosis not present

## 2016-03-10 LAB — CBC
HCT: 33.9 % — ABNORMAL LOW (ref 36.0–46.0)
HCT: 27.8 % — ABNORMAL LOW (ref 36.0–46.0)
HCT: 25.5 % — ABNORMAL LOW (ref 36.0–46.0)
Hemoglobin: 11.3 g/dL — ABNORMAL LOW (ref 12.0–15.0)
Hemoglobin: 9.3 g/dL — ABNORMAL LOW (ref 12.0–15.0)
Hemoglobin: 8.5 g/dL — ABNORMAL LOW (ref 12.0–15.0)
MCH: 30.9 pg (ref 26.0–34.0)
MCH: 31.1 pg (ref 26.0–34.0)
MCH: 30.4 pg (ref 26.0–34.0)
MCHC: 33.3 g/dL (ref 30.0–36.0)
MCHC: 33.5 g/dL (ref 30.0–36.0)
MCHC: 33.3 g/dL (ref 30.0–36.0)
MCV: 92.6 fL (ref 78.0–100.0)
MCV: 93 fL (ref 78.0–100.0)
MCV: 91.1 fL (ref 78.0–100.0)
Platelets: 330 10*3/uL (ref 150–400)
Platelets: 247 10*3/uL (ref 150–400)
Platelets: 246 10*3/uL (ref 150–400)
RBC: 3.66 MIL/uL — ABNORMAL LOW (ref 3.87–5.11)
RBC: 2.99 MIL/uL — ABNORMAL LOW (ref 3.87–5.11)
RBC: 2.8 MIL/uL — ABNORMAL LOW (ref 3.87–5.11)
RDW: 12.5 % (ref 11.5–15.5)
RDW: 12.8 % (ref 11.5–15.5)
RDW: 12.6 % (ref 11.5–15.5)
WBC: 14 10*3/uL — ABNORMAL HIGH (ref 4.0–10.5)
WBC: 11.4 10*3/uL — ABNORMAL HIGH (ref 4.0–10.5)
WBC: 8.4 10*3/uL (ref 4.0–10.5)

## 2016-03-10 LAB — COMPREHENSIVE METABOLIC PANEL
ALT: 18 U/L (ref 14–54)
AST: 20 U/L (ref 15–41)
Albumin: 4 g/dL (ref 3.5–5.0)
Alkaline Phosphatase: 42 U/L (ref 38–126)
Anion gap: 9 (ref 5–15)
BUN: 44 mg/dL — ABNORMAL HIGH (ref 6–20)
CO2: 25 mmol/L (ref 22–32)
Calcium: 8.8 mg/dL — ABNORMAL LOW (ref 8.9–10.3)
Chloride: 108 mmol/L (ref 101–111)
Creatinine, Ser: 0.54 mg/dL (ref 0.44–1.00)
GFR calc Af Amer: >60 mL/min (ref >60)
GFR calc non Af Amer: >60 mL/min (ref >60)
Glucose, Bld: 158 mg/dL — ABNORMAL HIGH (ref 65–99)
Potassium: 3.9 mmol/L (ref 3.5–5.1)
Sodium: 142 mmol/L (ref 135–145)
Total Bilirubin: 0.5 mg/dL (ref 0.3–1.2)
Total Protein: 6.7 g/dL (ref 6.5–8.1)

## 2016-03-10 LAB — MRSA PCR SCREENING: MRSA by PCR: NEGATIVE

## 2016-03-10 LAB — DIFFERENTIAL
Basophils Absolute: 0 10*3/uL (ref 0.0–0.1)
Basophils Relative: 0 %
Eosinophils Absolute: 0.2 10*3/uL (ref 0.0–0.7)
Eosinophils Relative: 1 %
Lymphocytes Relative: 18 %
Lymphs Abs: 2.5 10*3/uL (ref 0.7–4.0)
Monocytes Absolute: 0.8 10*3/uL (ref 0.1–1.0)
Monocytes Relative: 6 %
Neutro Abs: 10.4 10*3/uL — ABNORMAL HIGH (ref 1.7–7.7)
Neutrophils Relative %: 75 %

## 2016-03-10 LAB — TYPE AND SCREEN
ABO/RH(D): O POS
Antibody Screen: NEGATIVE

## 2016-03-10 LAB — I-STAT CHEM 8, ED
BUN: 41 mg/dL — ABNORMAL HIGH (ref 6–20)
Calcium, Ion: 1.08 mmol/L — ABNORMAL LOW (ref 1.15–1.40)
Chloride: 105 mmol/L (ref 101–111)
Creatinine, Ser: 0.5 mg/dL (ref 0.44–1.00)
Glucose, Bld: 172 mg/dL — ABNORMAL HIGH (ref 65–99)
HCT: 28 % — ABNORMAL LOW (ref 36.0–46.0)
Hemoglobin: 9.5 g/dL — ABNORMAL LOW (ref 12.0–15.0)
Potassium: 3.7 mmol/L (ref 3.5–5.1)
Sodium: 144 mmol/L (ref 135–145)
TCO2: 25 mmol/L (ref 0–100)

## 2016-03-10 LAB — ABO/RH: ABO/RH(D): O POS

## 2016-03-10 LAB — POC OCCULT BLOOD, ED: Fecal Occult Bld: POSITIVE — AB

## 2016-03-10 MED ORDER — SODIUM CHLORIDE 0.9 % IV BOLUS (SEPSIS)
1000.0000 mL | Freq: Once | INTRAVENOUS | Status: AC
  Administered 2016-03-10: 1000 mL via INTRAVENOUS

## 2016-03-10 MED ORDER — PANTOPRAZOLE SODIUM 40 MG IV SOLR
80.0000 mg | Freq: Once | INTRAVENOUS | Status: AC
  Administered 2016-03-10: 80 mg via INTRAVENOUS
  Filled 2016-03-10: qty 80

## 2016-03-10 MED ORDER — SODIUM CHLORIDE 0.9 % IV SOLN
8.0000 mg/h | INTRAVENOUS | Status: DC
  Administered 2016-03-10 – 2016-03-11 (×2): 8 mg/h via INTRAVENOUS
  Filled 2016-03-10 (×4): qty 80

## 2016-03-10 MED ORDER — ONDANSETRON HCL 4 MG/2ML IJ SOLN
4.0000 mg | Freq: Once | INTRAMUSCULAR | Status: AC
  Administered 2016-03-10: 4 mg via INTRAVENOUS
  Filled 2016-03-10: qty 2

## 2016-03-10 MED ORDER — SODIUM CHLORIDE 0.9 % IV BOLUS (SEPSIS): 1000.0000 mL | Freq: Once | INTRAVENOUS | Status: DC

## 2016-03-10 MED ORDER — SODIUM CHLORIDE 0.9 % IV SOLN
INTRAVENOUS | Status: DC
  Administered 2016-03-11: 01:00:00 via INTRAVENOUS

## 2016-03-10 MED ORDER — ONDANSETRON HCL 4 MG/2ML IJ SOLN
4.0000 mg | Freq: Three times a day (TID) | INTRAMUSCULAR | Status: DC
  Administered 2016-03-10 – 2016-03-12 (×6): 4 mg via INTRAVENOUS
  Filled 2016-03-10 (×5): qty 2

## 2016-03-10 NOTE — ED Provider Notes (Signed)
[facility] DEPT Provider Note   CSN: [id number] Arrival date & time: 03/10/16  1307     History   Chief Complaint Chief Complaint  Patient presents with  . Hematemesis  . Blood In Stools    HPI Sandra [name] is a 55 y.o. female.  HPI 55 year old female who presents with hematemesis. She has a history of coronary artery disease, Roux-en-Y gastric bypass. Has been seen by Dr. Carlean Purl in the past with colonoscopy and endoscopy showing a peptic ulcer disease, colonic polyps, and internal hemorrhoids. Has no prior history of GI bleeding. States she has been taking ibuprofen around the clock for the past 3-4 days due to neck pain. Today at around 10 AM had 2 episodes of hematemesis of bright red blood with blood clots. Total estimating about 500 cc. since then she has also had 2 episodes of melanotic stool. Has been feeling weak and lightheaded since then. No chest pain or difficulty breathing. No significant abdominal pain.   Past Medical History:  Diagnosis Date  . Anxiety   . Arthritis   . CAD (coronary artery disease) NON-OBSTRUCTIVE PER DR HOCHREIN NOTE 08-26-2011  . Cancer [facility]) 2006   uterine  . Complication of anesthesia PT STATES HAS LETTER FROM [facility]. STATING THAT SHE HAS A VERY SMALL AIRWAY (LAST SURG. HYSTERECTOMY AT [facility] IN 2006   PT STATES LYNN BEASON CRNA WHOM WORKS HERE TOLD SHE WOULD BE OKAY TO BE DONE AT [facility]  . Diabetes mellitus    no meds taken now, checks cbg once or twice a week   . 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 GERD (gastroesophageal reflux disease)   . Heart murmur    with last visit, MD states "outgrown" murmur  . Hemorrhoids, internal, with bleeding and Grade 2 prolapse 03/05/2013   All positions seen an anoscopy RP columns banded 03/05/2013 LL and RA banded 03/18/2013     . History of endometrial cancer 2006 ---- S/P ABD. HYSTERECTOMY   STAGE I ENDOMETROID CARCINOMA  . Hx of adenomatous colonic polyps 2011   BENIGN  . Hyperlipidemia    no  meds now  . Hypertension    no meds for last year  . Morbid obesity, weight - 203, BMI - 38.4 05/12/2013  . OSA on CPAP    cpap setting of 16  . Sleep apnea    no CPAP since surgery    Patient Active Problem List   Diagnosis Date Noted  . 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 Abdominal pain, epigastric   . Adjustment disorder with depressed mood 07/14/2014  . Hx of colonic polyps   . 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 Chronic constipation 03/05/2013  . Coronary atherosclerosis of native coronary artery 08/27/2011  . History of uterine cancer 08/15/2011  . Hypercholesterolemia 08/15/2011  . History of colonic polyps 12/29/2009    Past Surgical History:  Procedure Laterality Date  . ABDOMINAL HYSTERECTOMY  12-20-2004   TAH  . BILATERAL BREAST REDUCTION  11-16-1999  . BREATH TEK H PYLORI N/A 05/27/2013   Procedure: BREATH TEK H PYLORI;  Surgeon: Shann Medal, MD;  Location: [facility];  Service: General;  Laterality: N/A;  . [place]  . COLONOSCOPY    . COLONOSCOPY N/A 04/25/2014   Procedure: COLONOSCOPY;  Surgeon: Gatha Mayer, MD;  Location: [facility];  Service: Endoscopy;  Laterality: N/A;  . CYSTOSCOPY  09/06/2011   Procedure: CYSTOSCOPY;  Surgeon: Ailene Rud, MD;  Location: [facility];  Service: Urology;  Laterality: N/A;  LYNX SLING   . ESOPHAGOGASTRODUODENOSCOPY (EGD) WITH PROPOFOL N/A 09/27/2014   Procedure: ESOPHAGOGASTRODUODENOSCOPY (EGD) WITH PROPOFOL;  Surgeon: Gatha Mayer, MD;  Location: [facility];  Service: Endoscopy;  Laterality: N/A;  . GASTRIC ROUX-EN-Y N/A 10/04/2013   Procedure: LAPAROSCOPIC ROUX-EN-Y GASTRIC BYPASS WITH UPPER ENDOSCOPY;  Surgeon: Shann Medal, MD;  Location: [facility];  Service: General;  Laterality: N/A;  . HEMORRHOID BANDING  02/2013  . PUBOVAGINAL SLING  09/06/2011   Procedure: Gaynelle Arabian;  Surgeon: Ailene Rud, MD;  Location[facility];  Service: Urology;  Laterality: N/A;  . UVULOPALATOPHARYNGOPLASTY, TONSILLECTOMY AND SEPTOPLASTY  1994    OB History    Gravida Para Term Preterm AB Living   3 2     1 2    SAB TAB Ectopic Multiple Live Births   1               Home Medications    Prior to Admission medications   Medication Sig Start Date End Date Taking? Authorizing Provider  Calcium Carb-Cholecalciferol (CALCIUM PLUS VITAMIN D3 PO) Take 1 tablet by mouth 3 (three) times daily.    Yes Historical Provider, MD  ibuprofen (ADVIL,MOTRIN) 200 MG tablet Take 400 mg by mouth every 6 (six) hours as needed for headache or mild pain.   Yes Historical Provider, MD  Multiple Vitamins-Minerals (MULTIVITAMIN PO) Take 1 tablet by mouth 2 (two) times daily.    Yes Historical Provider, MD  vitamin B-12 (CYANOCOBALAMIN) 1000 MCG tablet Take 1,000 mcg by mouth daily.   Yes Historical Provider, MD  HYDROcodone-acetaminophen (NORCO/VICODIN) 5-325 MG per tablet Take 1-2 every 4 hours as needed for pain. Patient not taking: Reported on 03/10/2016 09/22/14   Gatha Mayer, MD  ondansetron (ZOFRAN) 4 MG tablet TAKE 1 TABLET BY MOUTH EVERY 8 HOURS AS NEEDED FOR NAUSEA AND VOMITING (MAY TAKE 2 TABLETS IF 1 NOT EFFECTIVE) Patient not taking: Reported on 03/10/2016 07/31/15   Gatha Mayer, MD  pantoprazole (PROTONIX) 40 MG tablet Take 1 tablet (40 mg total) by mouth daily before breakfast. Patient not taking: Reported on 03/10/2016 09/19/14   Gatha Mayer, MD  PARoxetine (PAXIL-CR) 12.5 MG 24 hr tablet Take 1 tablet (12.5 mg total) by mouth every morning. Patient not taking: Reported on 03/10/2016 10/05/15   Terrance Mass, MD  sucralfate (CARAFATE) 1 G tablet Take 1 tablet (1 g total) by mouth 4 (four) times daily -  with meals and at bedtime. Patient not taking: Reported on 03/10/2016 09/27/14   Gatha Mayer, MD    Family History Family History  Problem Relation Age of Onset  . Adopted: Yes    Social History Social History    Substance Use Topics  . Smoking status: Never Smoker  . Smokeless tobacco: Never Used  . Alcohol use No     Allergies   Patient has no known allergies.   Review of Systems Review of Systems 10/14 systems reviewed and are negative other than those stated in the HPI   Physical Exam Updated Vital Signs BP 133/94 (BP Location: Right Arm)   Pulse 82   Temp 99.3 F (37.4 C) (Oral)   Resp 13   Ht 5\' 2"  (1.575 m)   Wt 140 lb (63.5 kg)   SpO2 97%   BMI 25.61 kg/m   Physical Exam Physical Exam  Nursing note and vitals reviewed. Constitutional: Well developed, well nourished, non-toxic, and  in no acute distress Head: Normocephalic and atraumatic.  Mouth/Throat: Oropharynx is clear and moist.  Neck: Normal range of motion. Neck supple.  Cardiovascular: Normal rate and regular rhythm.   Pulmonary/Chest: Effort normal and breath sounds normal.  Abdominal: Soft. There is no tenderness. There is no rebound and no guarding. melena on rectal exam Musculoskeletal: Normal range of motion.  Neurological: Alert, no facial droop, fluent speech, moves all extremities symmetrically Skin: Skin is warm and dry.  Psychiatric: Cooperative   ED Treatments / Results  Labs (all labs ordered are listed, but only abnormal results are displayed) Labs Reviewed  COMPREHENSIVE METABOLIC PANEL - Abnormal; Notable for the following:       Result Value   Glucose, Bld 158 (*)    BUN 44 (*)    Calcium 8.8 (*)    All other components within normal limits  CBC - Abnormal; Notable for the following:    WBC 14.0 (*)    RBC 3.66 (*)    Hemoglobin 11.3 (*)    HCT 33.9 (*)    All other components within normal limits  DIFFERENTIAL - Abnormal; Notable for the following:    Neutro Abs 10.4 (*)    All other components within normal limits  POC OCCULT BLOOD, ED - Abnormal; Notable for the following:    Fecal Occult Bld POSITIVE (*)    All other components within normal limits  I-STAT CHEM 8, ED  TYPE  AND [name]    EKG  EKG Interpretation None       Radiology No results found.  Procedures Procedures (including critical care time)  Medications Ordered in ED Medications  pantoprazole (PROTONIX) 80 mg in sodium chloride 0.9 % 100 mL IVPB (80 mg Intravenous New Bag/Given 03/10/16 1458)  pantoprazole (PROTONIX) 80 mg in sodium chloride 0.9 % 250 mL (0.32 mg/mL) infusion (8 mg/hr Intravenous New Bag/Given 03/10/16 1456)  ondansetron (ZOFRAN) injection 4 mg (4 mg Intravenous Given 03/10/16 1445)  sodium chloride 0.9 % bolus 1,000 mL (1,000 mLs Intravenous New Bag/Given 03/10/16 1456)     Initial Impression / Assessment and Plan / ED Course  I have reviewed the triage vital signs and the nursing notes.  Pertinent labs & imaging results that were available during my care of the patient were reviewed by me and considered in my medical decision making (see chart for details).  Clinical Course     Presenting with upper GI bleed, felt to be likely peptic ulcer in origin. History of UNI and has been taking around-the-clock Motrin for the past few days. Hemodynamically stable here in the ED. With melena on rectal exam. Has acute blood loss anemia of 11.3, with baseline around 13. His elevated BUN/creatinine ratio as well. Placed on protonic strip. Discussed with Dr. Ardis Hughs from GI who will see patient in ED for potential endoscopy. Also discussed with hospitalist service will admit for ongoing management.   Final Clinical Impressions(s) / ED Diagnoses   Final diagnoses:  Upper GI bleed  Acute blood loss anemia  Hematemesis with nausea  Melena    New Prescriptions New Prescriptions   No medications on file     Forde Dandy, MD 03/10/16 1501

## 2016-03-10 NOTE — ED Notes (Signed)
GI at bedside

## 2016-03-10 NOTE — ED Notes (Signed)
Pt had a syncopal episode. Was unconscious 5 seconds. Felt nauseated prior to syncope

## 2016-03-10 NOTE — ED Notes (Signed)
TWO GOLD and ONE BLUE tubes sent to main lab with blood draw.

## 2016-03-10 NOTE — ED Triage Notes (Signed)
Pt complaint of moderate mid upper abdominal pain with associated emesis and loose stool. Pt verbalizes vomit was bright red in color and stool was loose and black. Pt denies hx of same. Onset 0200 this morning.

## 2016-03-10 NOTE — ED Notes (Signed)
Bed: [id number] Expected date:  Expected time:  Means of arrival:  Comments: Expected Upper GI Bleed

## 2016-03-10 NOTE — Consult Note (Signed)
[facility] Referring Provider: Dr. Oleta Mouse from [facility] ER Primary Care Physician:  Stephens Shire, MD Primary Gastroenterologist:  Dr. Carlean Purl  Reason for Consultation: UGI bleeding   HPI:  Sandra [name] is a 55 y.o. female who works at [employer].  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 Carlean Purl for abd pains that showed marginal ulcer (small bowel side of GJ).  Biopsies at that time showed no H. Plori. She was on PPI and was put on carafate QID.  She stopped both of those meds after 1-2 months and has not had trouble since.  Will periodically take ibuprofen (2-3 per week) but in the past 3-4 days she's been taking about 2,000mg  per day of ibuprofen for new neck pains.  Early this AM she awoke, vomiting red blood. Shortly afterwards melenic stools twice.  She called our office, I returned the call and instructed her to go to [facility] ER.  Here she is is feeling well, CBC shows Hb 11.3 (previously was 13).  BUN 44, Cr 0.5. She became dizzy and then brief syncope in ER when sitting upright. Repeat Hb was 9.5. VS have been otherwise stable with HR in 70s currently.  No overt bleeding but rectal exam in ER + gross melena.  Otherwise feels well without chest pains, abd pains.  She was already started on protonix bolus and drip.   Past Medical History:  Diagnosis Date  . Anxiety   . Arthritis   . CAD (coronary artery disease) NON-OBSTRUCTIVE PER DR HOCHREIN NOTE 08-26-2011  . Cancer [facility]) 2006   uterine  . Complication of anesthesia PT STATES HAS LETTER FROM [place] [facility]. STATING THAT SHE HAS A VERY SMALL AIRWAY (LAST SURG. HYSTERECTOMY AT [facility] IN 2006   PT STATES LYNN BEASON CRNA WHOM WORKS HERE TOLD SHE WOULD BE OKAY TO BE DONE AT [facility]  . Diabetes mellitus    no meds taken now, checks cbg once or twice a week   . 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 GERD (gastroesophageal reflux disease)   . Heart murmur    with last visit, MD states "outgrown" murmur  .  Hemorrhoids, internal, with bleeding and Grade 2 prolapse 03/05/2013   All positions seen an anoscopy RP columns banded 03/05/2013 LL and RA banded 03/18/2013     . History of endometrial cancer 2006 ---- S/P ABD. HYSTERECTOMY   STAGE I ENDOMETROID CARCINOMA  . Hx of adenomatous colonic polyps 2011   BENIGN  . Hyperlipidemia    no meds now  . Hypertension    no meds for last year  . Morbid obesity, weight - 203, BMI - 38.4 05/12/2013  . OSA on CPAP    cpap setting of 16  . Sleep apnea    no CPAP since surgery    Past Surgical History:  Procedure Laterality Date  . ABDOMINAL HYSTERECTOMY  12-20-2004   TAH  . BILATERAL BREAST REDUCTION  11-16-1999  . BREATH TEK H PYLORI N/A 05/27/2013   Procedure: BREATH TEK H PYLORI;  Surgeon: Shann Medal, MD;  Location: [facility];  Service: General;  Laterality: N/A;  . [place]  . COLONOSCOPY    . COLONOSCOPY N/A 04/25/2014   Procedure: COLONOSCOPY;  Surgeon: Gatha Mayer, MD;  Location: [facility];  Service: Endoscopy;  Laterality: N/A;  . CYSTOSCOPY  09/06/2011   Procedure: CYSTOSCOPY;  Surgeon: Ailene Rud, MD;  Location: [facility];  Service: Urology;  Laterality: N/A;  LYNX SLING   . ESOPHAGOGASTRODUODENOSCOPY (EGD) WITH PROPOFOL N/A 09/27/2014   Procedure: ESOPHAGOGASTRODUODENOSCOPY (EGD) WITH PROPOFOL;  Surgeon: Gatha Mayer, MD;  Location: [facility];  Service: Endoscopy;  Laterality: N/A;  . GASTRIC ROUX-EN-Y N/A 10/04/2013   Procedure: LAPAROSCOPIC ROUX-EN-Y GASTRIC BYPASS WITH UPPER ENDOSCOPY;  Surgeon: Shann Medal, MD;  Location: [facility];  Service: General;  Laterality: N/A;  . HEMORRHOID BANDING  02/2013  . PUBOVAGINAL SLING  09/06/2011   Procedure: Gaynelle Arabian;  Surgeon: Ailene Rud, MD;  Location: [facility];  Service: Urology;  Laterality: N/A;  . UVULOPALATOPHARYNGOPLASTY, TONSILLECTOMY AND SEPTOPLASTY  1994    Prior to Admission medications    Medication Sig Start Date End Date Taking? Authorizing Provider  Calcium Carb-Cholecalciferol (CALCIUM PLUS VITAMIN D3 PO) Take 1 tablet by mouth 3 (three) times daily.    Yes Historical Provider, MD  ibuprofen (ADVIL,MOTRIN) 200 MG tablet Take 400 mg by mouth every 6 (six) hours as needed for headache or mild pain.   Yes Historical Provider, MD  Multiple Vitamins-Minerals (MULTIVITAMIN PO) Take 1 tablet by mouth 2 (two) times daily.    Yes Historical Provider, MD  vitamin B-12 (CYANOCOBALAMIN) 1000 MCG tablet Take 1,000 mcg by mouth daily.   Yes Historical Provider, MD  HYDROcodone-acetaminophen (NORCO/VICODIN) 5-325 MG per tablet Take 1-2 every 4 hours as needed for pain. Patient not taking: Reported on 03/10/2016 09/22/14   Gatha Mayer, MD  ondansetron (ZOFRAN) 4 MG tablet TAKE 1 TABLET BY MOUTH EVERY 8 HOURS AS NEEDED FOR NAUSEA AND VOMITING (MAY TAKE 2 TABLETS IF 1 NOT EFFECTIVE) Patient not taking: Reported on 03/10/2016 07/31/15   Gatha Mayer, MD  pantoprazole (PROTONIX) 40 MG tablet Take 1 tablet (40 mg total) by mouth daily before breakfast. Patient not taking: Reported on 03/10/2016 09/19/14   Gatha Mayer, MD  PARoxetine (PAXIL-CR) 12.5 MG 24 hr tablet Take 1 tablet (12.5 mg total) by mouth every morning. Patient not taking: Reported on 03/10/2016 10/05/15   Terrance Mass, MD  sucralfate (CARAFATE) 1 G tablet Take 1 tablet (1 g total) by mouth 4 (four) times daily -  with meals and at bedtime. Patient not taking: Reported on 03/10/2016 09/27/14   Gatha Mayer, MD    Current Facility-Administered Medications  Medication Dose Route Frequency Provider Last Rate Last Dose  . pantoprazole (PROTONIX) 80 mg in sodium chloride 0.9 % 250 mL (0.32 mg/mL) infusion  8 mg/hr Intravenous Continuous Forde Dandy, MD 25 mL/hr at 03/10/16 1456 8 mg/hr at 03/10/16 1456   Current Outpatient Prescriptions  Medication Sig Dispense Refill  . Calcium Carb-Cholecalciferol (CALCIUM PLUS VITAMIN D3  PO) Take 1 tablet by mouth 3 (three) times daily.     Marland Kitchen ibuprofen (ADVIL,MOTRIN) 200 MG tablet Take 400 mg by mouth every 6 (six) hours as needed for headache or mild pain.    . Multiple Vitamins-Minerals (MULTIVITAMIN PO) Take 1 tablet by mouth 2 (two) times daily.     . vitamin B-12 (CYANOCOBALAMIN) 1000 MCG tablet Take 1,000 mcg by mouth daily.    Marland Kitchen HYDROcodone-acetaminophen (NORCO/VICODIN) 5-325 MG per tablet Take 1-2 every 4 hours as needed for pain. (Patient not taking: Reported on 03/10/2016) 30 tablet 0  . ondansetron (ZOFRAN) 4 MG tablet TAKE 1 TABLET BY MOUTH EVERY 8 HOURS AS NEEDED FOR NAUSEA AND VOMITING (MAY TAKE 2 TABLETS IF 1 NOT EFFECTIVE) (Patient not taking: Reported on 03/10/2016) 30 tablet 1  . pantoprazole (PROTONIX) 40  MG tablet Take 1 tablet (40 mg total) by mouth daily before breakfast. (Patient not taking: Reported on 03/10/2016) 30 tablet 2  . PARoxetine (PAXIL-CR) 12.5 MG 24 hr tablet Take 1 tablet (12.5 mg total) by mouth every morning. (Patient not taking: Reported on 03/10/2016) 30 tablet 11  . sucralfate (CARAFATE) 1 G tablet Take 1 tablet (1 g total) by mouth 4 (four) times daily -  with meals and at bedtime. (Patient not taking: Reported on 03/10/2016) 120 tablet 2    Allergies as of 03/10/2016  . (No Known Allergies)    Family History  Problem Relation Age of Onset  . Adopted: Yes    Social History   Social History  . Marital status: Single    Spouse name: N/A  . Number of children: N/A  . Years of education: N/A   Occupational History  . Not on file.   Social History Main Topics  . Smoking status: Never Smoker  . Smokeless tobacco: Never Used  . Alcohol use No  . Drug use: No  . Sexual activity: Yes    Birth control/ protection: Surgical     Comment: HYST.. 1st intercourse- 13, partners- 6   Other Topics Concern  . Not on file   Social History Narrative   Married, second time, has children and cares for granddaughter   Endoscopy Tech at  [employer]     Review of Systems: Pertinent positive and negative review of systems were noted in the above HPI section. Complete review of systems was performed and was otherwise normal.   Physical Exam: Vital signs in last 24 hours: Temp:  [99.3 F (37.4 C)-99.9 F (37.7 C)] 99.3 F (37.4 C) (11/12 1449) Pulse Rate:  [82-92] 82 (11/12 1449) Resp:  [13-16] 13 (11/12 1449) BP: (133-148)/(89-94) 133/94 (11/12 1449) SpO2:  [96 %-97 %] 97 % (11/12 1449) Weight:  [140 lb (63.5 kg)] 140 lb (63.5 kg) (11/12 1317)   Constitutional: generally well-appearing Psychiatric: alert and oriented x3 Eyes: extraocular movements intact Mouth: oral pharynx moist, no lesions Neck: supple no lymphadenopathy Cardiovascular: heart regular rate and rhythm Lungs: clear to auscultation bilaterally Abdomen: soft, nontender, nondistended, no obvious ascites, no peritoneal signs, normal bowel sounds Extremities: no lower extremity edema bilaterally Skin: no lesions on visible extremities   Lab Results:  Recent Labs  03/10/16 1323 03/10/16 1513  WBC 14.0*  --   HGB 11.3* 9.5*  HCT 33.9* 28.0*  PLT 330  --   MCV 92.6  --    BMET  Recent Labs  03/10/16 1323 03/10/16 1513  NA 142 144  K 3.9 3.7  CL 108 105  CO2 25  --   GLUCOSE 158* 172*  BUN 44* 41*  CREATININE 0.54 0.50  CALCIUM 8.8*  --    LFT  Recent Labs  03/10/16 1323  BILITOT 0.5  AST 20  ALT 18  ALKPHOS 42  PROT 6.7  ALBUMIN 4.0    Impression/Plan: 55 y.o. female with UGI bleed, likely NSAID related  H/o marginal ulcer at [place] roux-en-y anastomosis (2016).  Has not been on PPI.  Has been taking NSAID 2-3 times per week normally and then extreme amounts of ibuprofen for the past 3-4 days for neck pains. 12-16 ibuprofen pills per day.  Admit to step down, hospitalist was called already by ER.  Continue protonix drip.  Follow serial H/H.  NPO for now.  She is already set for EGD in the morning with Dr.  Hilarie Fredrickson.  Please call on call MD prior to then if any questions or concerns.    Milus Banister, MD  03/10/2016, 3:23 PM [facility] Pager [phone]

## 2016-03-10 NOTE — H&P (Signed)
[facility] History and Physical  Sandra [name] [id number] DOB: 10-11-1960 DOA: 03/10/2016  Referring physician: ED PCP: Stephens Shire, MD  Specialists: GI  Chief Complaint:   HPI:  65 ?  PUD in a setting of H/o Roux-en-y Hemorrhoids,  3-4 Ibuprofen   Prior dm/OSA and HTn and stopped meds subsequent to surgery  Serrated Polpy Colon h/o with 3 further polpy 1-3 mm 04/25/14 Last endo 09/27/14=medsized clean ulcer small bowel side gej   2 episodes of hemetemesis 0200 03/10/2016 had some cranberry juice but then proceeded at 1000 hrs. to have 2 episodes further of vomiting of chunky-like material She came to the emergency room and had 2 episodes of melena as well  Had a syncopal episode in ED prior to me seeing her--no prior episode of same and nursing reports that she just slumped over in the bed but was easily and instantly arousable subsequently and this has not been before  Drop in Hb 13-->11 BUN /Creat elevated  From 21/0.56--->44/0.54 Active melena  Started on Protonix Gtt by ED     EKG pending and rp tblood work pending  Gi consulted to see and will probably scope today  Past Medical History:  Diagnosis Date  . Anxiety   . Arthritis   . CAD (coronary artery disease) NON-OBSTRUCTIVE PER DR HOCHREIN NOTE 08-26-2011  . Cancer [facility]) 2006   uterine  . Complication of anesthesia PT STATES HAS LETTER FROM [place] [facility]. STATING THAT SHE HAS A VERY SMALL AIRWAY (LAST SURG. HYSTERECTOMY AT [facility] IN 2006   PT STATES LYNN BEASON CRNA WHOM WORKS HERE TOLD SHE WOULD BE OKAY TO BE DONE AT [facility]  . Diabetes mellitus    no meds taken now, checks cbg once or twice a week   . 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 GERD (gastroesophageal reflux disease)   . Heart murmur    with last visit, MD states "outgrown" murmur  . Hemorrhoids, internal, with bleeding and Grade 2 prolapse 03/05/2013   All positions seen an anoscopy RP columns banded 03/05/2013 LL and RA banded 03/18/2013      . History of endometrial cancer 2006 ---- S/P ABD. HYSTERECTOMY   STAGE I ENDOMETROID CARCINOMA  . Hx of adenomatous colonic polyps 2011   BENIGN  . Hyperlipidemia    no meds now  . Hypertension    no meds for last year  . Morbid obesity, weight - 203, BMI - 38.4 05/12/2013  . OSA on CPAP    cpap setting of 16  . Sleep apnea    no CPAP since surgery   Past Surgical History:  Procedure Laterality Date  . ABDOMINAL HYSTERECTOMY  12-20-2004   TAH  . BILATERAL BREAST REDUCTION  11-16-1999  . BREATH TEK H PYLORI N/A 05/27/2013   Procedure: BREATH TEK H PYLORI;  Surgeon: Shann Medal, MD;  Location: [facility];  Service: General;  Laterality: N/A;  . [place]  . COLONOSCOPY    . COLONOSCOPY N/A 04/25/2014   Procedure: COLONOSCOPY;  Surgeon: Gatha Mayer, MD;  Location: [facility];  Service: Endoscopy;  Laterality: N/A;  . CYSTOSCOPY  09/06/2011   Procedure: CYSTOSCOPY;  Surgeon: Ailene Rud, MD;  Location: [facility];  Service: Urology;  Laterality: N/A;  LYNX SLING   . ESOPHAGOGASTRODUODENOSCOPY (EGD) WITH PROPOFOL N/A 09/27/2014   Procedure: ESOPHAGOGASTRODUODENOSCOPY (EGD) WITH PROPOFOL;  Surgeon: Gatha Mayer, MD;  [facility];  Service: Endoscopy;  Laterality: N/A;  . GASTRIC ROUX-EN-Y  N/A 10/04/2013   Procedure: LAPAROSCOPIC ROUX-EN-Y GASTRIC BYPASS WITH UPPER ENDOSCOPY;  Surgeon: Shann Medal, MD;  Location: [facility];  Service: General;  Laterality: N/A;  . HEMORRHOID BANDING  02/2013  . PUBOVAGINAL SLING  09/06/2011   Procedure: Gaynelle Arabian;  Surgeon: Ailene Rud, MD;  Location: [facility];  Service: Urology;  Laterality: N/A;  . 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 Adopted: Yes    Prior to Admission medications   Medication Sig Start Date End Date Taking? Authorizing Provider  Calcium Carb-Cholecalciferol (CALCIUM PLUS VITAMIN D3 PO) Take 1 tablet by mouth 3 (three) times daily.    Yes Historical Provider, MD  ibuprofen (ADVIL,MOTRIN) 200 MG tablet Take 400 mg by mouth every 6 (six) hours as needed for headache or mild pain.   Yes Historical Provider, MD  Multiple Vitamins-Minerals (MULTIVITAMIN PO) Take 1 tablet by mouth 2 (two) times daily.    Yes Historical Provider, MD  vitamin B-12 (CYANOCOBALAMIN) 1000 MCG tablet Take 1,000 mcg by mouth daily.   Yes Historical Provider, MD  HYDROcodone-acetaminophen (NORCO/VICODIN) 5-325 MG per tablet Take 1-2 every 4 hours as needed for pain. Patient not taking: Reported on 03/10/2016 09/22/14   Gatha Mayer, MD  ondansetron (ZOFRAN) 4 MG tablet TAKE 1 TABLET BY MOUTH EVERY 8 HOURS AS NEEDED FOR NAUSEA AND VOMITING (MAY TAKE 2 TABLETS IF 1 NOT EFFECTIVE) Patient not taking: Reported on 03/10/2016 07/31/15   Gatha Mayer, MD  pantoprazole (PROTONIX) 40 MG tablet Take 1 tablet (40 mg total) by mouth daily before breakfast. Patient not taking: Reported on 03/10/2016 09/19/14   Gatha Mayer, MD  PARoxetine (PAXIL-CR) 12.5 MG 24 hr tablet Take 1 tablet (12.5 mg total) by mouth every morning. Patient not taking: Reported on 03/10/2016 10/05/15   Terrance Mass, MD  sucralfate (CARAFATE) 1 G tablet Take 1 tablet (1 g total) by mouth 4 (four) times daily -  with meals and at bedtime. Patient not taking: Reported on 03/10/2016 09/27/14   Gatha Mayer, MD   Physical Exam: Vitals:   03/10/16 1316 03/10/16 1317 03/10/16 1445  BP: 148/89  133/94  Pulse: 92  90  Resp: 16  16  Temp: 99.9 F (37.7 C)    TempSrc: Oral    SpO2: 96%    Weight:  63.5 kg (140 lb)   Height:  5\' 2"  (1.575 m)    Anxious alert pleasant oriented S1-S2 no murmur rub or  gallop Chest is clinically clear no added sound Abdomen is slightly tender in epigastrium No rebound no guarding No lower extremity edema Neuro is grossly intact moving all 4 limbs equally Cranial nerves are intact Chest is clinically clear  Labs on Admission:  Basic Metabolic Panel:  Recent Labs Lab 03/10/16 1323  NA 142  K 3.9  CL 108  CO2 25  GLUCOSE 158*  BUN 44*  CREATININE 0.54  CALCIUM 8.8*   Liver Function Tests:  Recent Labs Lab 03/10/16 1323  AST 20  ALT 18  ALKPHOS 42  BILITOT 0.5  PROT 6.7  ALBUMIN 4.0   No results for input(s): LIPASE, AMYLASE in the last 168 hours. No results for input(s): AMMONIA in  the last 168 hours. CBC:  Recent Labs Lab 03/10/16 1323  WBC 14.0*  NEUTROABS 10.4*  HGB 11.3*  HCT 33.9*  MCV 92.6  PLT 330   Cardiac Enzymes: No results for input(s): CKTOTAL, CKMB, CKMBINDEX, TROPONINI in the last 168 hours.  BNP (last 3 results) No results for input(s): BNP in the last 8760 hours.  ProBNP (last 3 results) No results for input(s): PROBNP in the last 8760 hours.  CBG: No results for input(s): GLUCAP in the last 168 hours.  Radiological Exams on Admission: No results found.  EKG: Independently reviewed.   Sinus rhythm PR interval 0 point 08 QRS axis 45 no ST-T wave changes to suggest ischemia  Assessment/Plan  Upper GI bleed Going to stepdown Nothing by mouth Continue Protonix gtt. there has been started in the emergency room and may need for 72 hours Type and screen and follow repeat labs performed Expect gastroenterology was scoped patient and will follow expectantly Hold all nonessential meds  Syncope probably secondary to brisk blood loss Would hold off on octreotide for now but if continuing would obviate initiate the same and defer further treatment to gastroenterology 1 scope was done  Neck pain Nonsteroidals should be contraindicated her on not May benefit from Zanaflex if able to take by mouth  subsequently  Leukocytosis is reactive and probably secondary to stress of the bleed and would not cover with antibiotics for now  Bipolar continue Paxil 12.5 every morning when able  Admit to stepdown Agent is critically ill and will require invasive monitoring at least for 24-48 hours Inpatient Long discussion with daughter at the bedside [place], [place] [facility] Pager [phone]  If 7PM-7AM, please contact night-coverage [email or web address] Password [email or web address] 03/10/2016, 2:50 PM

## 2016-03-10 NOTE — ED Notes (Signed)
Med student at bedside

## 2016-03-10 NOTE — ED Notes (Signed)
MD at bedside. 

## 2016-03-11 ENCOUNTER — Inpatient Hospital Stay (HOSPITAL_COMMUNITY): Payer: 59 | Admitting: Certified Registered"

## 2016-03-11 ENCOUNTER — Encounter (HOSPITAL_COMMUNITY): Payer: Self-pay | Admitting: Certified Registered"

## 2016-03-11 ENCOUNTER — Encounter (HOSPITAL_COMMUNITY)
Admission: EM | Disposition: A | Discharge status: Home / Self Care | Payer: Self-pay | Source: Home / Self Care | Attending: Family Medicine

## 2016-03-11 DIAGNOSIS — K920 Hematemesis: Secondary | ICD-10-CM

## 2016-03-11 DIAGNOSIS — E876 Hypokalemia: Secondary | ICD-10-CM

## 2016-03-11 DIAGNOSIS — K289 Gastrojejunal ulcer, unspecified as acute or chronic, without hemorrhage or perforation: Secondary | ICD-10-CM

## 2016-03-11 DIAGNOSIS — E860 Dehydration: Secondary | ICD-10-CM

## 2016-03-11 DIAGNOSIS — R55 Syncope and collapse: Secondary | ICD-10-CM

## 2016-03-11 DIAGNOSIS — D62 Acute posthemorrhagic anemia: Secondary | ICD-10-CM

## 2016-03-11 HISTORY — PX: ESOPHAGOGASTRODUODENOSCOPY (EGD) WITH PROPOFOL: SHX5813

## 2016-03-11 LAB — COMPREHENSIVE METABOLIC PANEL
ALT: 16 U/L (ref 14–54)
AST: 15 U/L (ref 15–41)
Albumin: 3.1 g/dL — ABNORMAL LOW (ref 3.5–5.0)
Alkaline Phosphatase: 31 U/L — ABNORMAL LOW (ref 38–126)
Anion gap: 2 — ABNORMAL LOW (ref 5–15)
BUN: 31 mg/dL — ABNORMAL HIGH (ref 6–20)
CO2: 26 mmol/L (ref 22–32)
Calcium: 8 mg/dL — ABNORMAL LOW (ref 8.9–10.3)
Chloride: 115 mmol/L — ABNORMAL HIGH (ref 101–111)
Creatinine, Ser: 0.55 mg/dL (ref 0.44–1.00)
GFR calc Af Amer: >60 mL/min (ref >60)
GFR calc non Af Amer: >60 mL/min (ref >60)
Glucose, Bld: 113 mg/dL — ABNORMAL HIGH (ref 65–99)
Potassium: 3.3 mmol/L — ABNORMAL LOW (ref 3.5–5.1)
Sodium: 143 mmol/L (ref 135–145)
Total Bilirubin: 0.5 mg/dL (ref 0.3–1.2)
Total Protein: 5.3 g/dL — ABNORMAL LOW (ref 6.5–8.1)

## 2016-03-11 LAB — GLUCOSE, CAPILLARY: Glucose-Capillary: 116 mg/dL — ABNORMAL HIGH (ref 65–99)

## 2016-03-11 LAB — CBC
HCT: 24.5 % — ABNORMAL LOW (ref 36.0–46.0)
HCT: 26.3 % — ABNORMAL LOW (ref 36.0–46.0)
Hemoglobin: 8.1 g/dL — ABNORMAL LOW (ref 12.0–15.0)
Hemoglobin: 8.7 g/dL — ABNORMAL LOW (ref 12.0–15.0)
MCH: 30.2 pg (ref 26.0–34.0)
MCH: 30.2 pg (ref 26.0–34.0)
MCHC: 33.1 g/dL (ref 30.0–36.0)
MCHC: 33.1 g/dL (ref 30.0–36.0)
MCV: 91.4 fL (ref 78.0–100.0)
MCV: 91.3 fL (ref 78.0–100.0)
Platelets: 230 10*3/uL (ref 150–400)
Platelets: 259 10*3/uL (ref 150–400)
RBC: 2.68 MIL/uL — ABNORMAL LOW (ref 3.87–5.11)
RBC: 2.88 MIL/uL — ABNORMAL LOW (ref 3.87–5.11)
RDW: 12.6 % (ref 11.5–15.5)
RDW: 12.6 % (ref 11.5–15.5)
WBC: 7.4 10*3/uL (ref 4.0–10.5)
WBC: 7.7 10*3/uL (ref 4.0–10.5)

## 2016-03-11 LAB — PROTIME-INR
INR: 1.08
Prothrombin Time: 14 seconds (ref 11.4–15.2)

## 2016-03-11 SURGERY — ESOPHAGOGASTRODUODENOSCOPY (EGD) WITH PROPOFOL
Anesthesia: Monitor Anesthesia Care

## 2016-03-11 MED ORDER — LIDOCAINE 2% (20 MG/ML) 5 ML SYRINGE
INTRAMUSCULAR | Status: DC | PRN
  Administered 2016-03-11: 50 mg via INTRAVENOUS

## 2016-03-11 MED ORDER — SODIUM CHLORIDE 0.9 % IV SOLN
INTRAVENOUS | Status: DC
  Administered 2016-03-11 – 2016-03-12 (×2): via INTRAVENOUS
  Filled 2016-03-11 (×5): qty 1000

## 2016-03-11 MED ORDER — PROPOFOL 10 MG/ML IV BOLUS
INTRAVENOUS | Status: AC
  Filled 2016-03-11: qty 20

## 2016-03-11 MED ORDER — SUCRALFATE 1 GM/10ML PO SUSP
1.0000 g | Freq: Three times a day (TID) | ORAL | Status: DC
  Administered 2016-03-11 – 2016-03-12 (×4): 1 g via ORAL
  Filled 2016-03-11 (×4): qty 10

## 2016-03-11 MED ORDER — PROPOFOL 10 MG/ML IV BOLUS
INTRAVENOUS | Status: DC | PRN
Start: 2016-03-11 — End: 2016-03-11
  Administered 2016-03-11 (×2): 50 mg via INTRAVENOUS

## 2016-03-11 MED ORDER — ESOMEPRAZOLE MAGNESIUM 40 MG PO CPDR
40.0000 mg | DELAYED_RELEASE_CAPSULE | Freq: Two times a day (BID) | ORAL | Status: DC
  Administered 2016-03-11 – 2016-03-12 (×2): 40 mg via ORAL
  Filled 2016-03-11 (×4): qty 1

## 2016-03-11 MED ORDER — NON FORMULARY: 40.0000 mg | Freq: Two times a day (BID) | Status: DC

## 2016-03-11 MED ORDER — SODIUM CHLORIDE 0.9 % IV SOLN: INTRAVENOUS | Status: DC

## 2016-03-11 MED ORDER — SODIUM CHLORIDE 0.9 % IV SOLN
INTRAVENOUS | Status: DC | PRN
  Administered 2016-03-11: 09:00:00 via INTRAVENOUS

## 2016-03-11 MED ORDER — LIDOCAINE 2% (20 MG/ML) 5 ML SYRINGE
INTRAMUSCULAR | Status: AC
  Filled 2016-03-11: qty 5

## 2016-03-11 SURGICAL SUPPLY — 14 items

## 2016-03-11 NOTE — Op Note (Signed)
[facility] Patient Name: Sandra [name] Procedure Date: 03/11/2016 MRN: [id number] Attending MD: Jerene Bears , MD Date of Birth: 1961/04/05 CSN: [id number] Age: 55 Admit Type: Inpatient Procedure:                Upper GI endoscopy Indications:              Acute post hemorrhagic anemia, Hematemesis, Melena,                            personal history of anastomotic ulcers at EGD in                            May 2016 Providers:                Lajuan Lines. Hilarie Fredrickson, MD, Vista Lawman, RN, Ralene Bathe,                            Technician Referring MD:             [facility] Medicines:                Monitored Anesthesia Care Complications:            No immediate complications. Estimated Blood Loss:     Estimated blood loss: none. Procedure:                Pre-Anesthesia Assessment:                           - Prior to the procedure, a History and Physical                            was performed, and patient medications and                            allergies were reviewed. The patient's tolerance of                            previous anesthesia was also reviewed. The risks                            and benefits of the procedure and the sedation                            options and risks were discussed with the patient.                            All questions were answered, and informed consent                            was obtained. Prior Anticoagulants: The patient has                            taken no previous anticoagulant or antiplatelet  agents. ASA Grade Assessment: III - A patient with                            severe systemic disease. After reviewing the risks                            and benefits, the patient was deemed in                            satisfactory condition to undergo the procedure.                           After obtaining informed consent, the endoscope was                            passed under  direct vision. Throughout the                            procedure, the patient's blood pressure, pulse, and                            oxygen saturations were monitored continuously. The                            [id number]) scope was introduced through the                            mouth, and advanced to the efferent jejunal loop.                            The upper GI endoscopy was accomplished without                            difficulty. The patient tolerated the procedure                            well. Scope In: Scope Out: Findings:      The examined esophagus was normal.      The Z-line was regular and was found 39 cm from the incisors.      Normal mucosa was found in the 4-5 cm gastric pouch (anastomosis on the       pouch side appeared normal).      Evidence of a Roux-en-Y gastrojejunostomy was found. The gastrojejunal       anastomosis was characterized by ulceration. There were 3 discrete,       clean-based, ulcers on the jejunal side with visible staples. This was       traversed. The pouch-to-jejunum limb was characterized by healthy       appearing mucosa. There was no evidence of active or recent bleeding.      The examined jejunum was normal. Impression:               - Normal esophagus.                           -  Z-line regular, 39 cm from the incisors.                           - Normal mucosa was found in the gastric pouch.                           - Roux-en-Y gastrojejunostomy with clean-based                            anastomotic ulcers (as described above).                           - Normal examined jejunum.                           - No specimens collected. Moderate Sedation:      N/A Recommendation:           - Return patient to hospital ward for ongoing care.                           - Advance diet as tolerated.                           - No aspirin, ibuprofen, naproxen, or other                            non-steroidal anti-inflammatory  drugs.                           - Change to twice daily PO PPI in capsule form                            which can be opened and sprinkled on pudding or                            applesauce (to enhance absorption).                           - Resume Carafate (make into slurry) three times                            daily before meals and at bedtime.                           - Monitor Hgb closely to ensure                            improvement/normalization.                           - Check H. Pylori stool Ag and treat if positive                            (though NSAID use is felt most likely cause of  ulcers). Procedure Code(s):        --- Professional ---                           [phone], Esophagogastroduodenoscopy, flexible,                            transoral; diagnostic, including collection of                            specimen(s) by brushing or washing, when performed                            (separate procedure) Diagnosis Code(s):        --- Professional ---                           Z98.0, Intestinal bypass and anastomosis status                           D62, Acute posthemorrhagic anemia                           K92.0, Hematemesis                           K92.1, Melena (includes Hematochezia) CPT copyright 2016 American Medical Association. All rights reserved. The codes documented in this report are preliminary and upon coder review may  be revised to meet current compliance requirements. Jerene Bears, MD 03/11/2016 9:51:38 AM This report has been signed electronically. Number of Addenda: 0

## 2016-03-11 NOTE — Interval H&P Note (Signed)
History and Physical Interval Note: Pt stable overnight. Hgb 8.1 No further hematemesis.  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 Last EGD May 2016 for anastomotic ulcer. Plan EGD now.  MAC. The nature of the procedure, as well as the risks, benefits, and alternatives were carefully and thoroughly reviewed with the patient. Ample time for discussion and questions allowed. The patient understood, was satisfied, and agreed to proceed.     03/11/2016 9:20 AM  Sandra [name]  has presented today for surgery, with the diagnosis of UGI bleeding  The various methods of treatment have been discussed with the patient and family. After consideration of risks, benefits and other options for treatment, the patient has consented to  Procedure(s): ESOPHAGOGASTRODUODENOSCOPY (EGD) WITH PROPOFOL (N/A) as a surgical intervention .  The patient's history has been reviewed, patient examined, no change in status, stable for surgery.  I have reviewed the patient's chart and labs.  Questions were answered to the patient's satisfaction.     Lawanna Cecere M

## 2016-03-11 NOTE — H&P (View-Only) (Signed)
[facility] Referring Provider: Dr. Oleta Mouse from [facility] ER Primary Care Physician:  Stephens Shire, MD Primary Gastroenterologist:  Dr. Carlean Purl  Reason for Consultation: UGI bleeding   HPI:  Sandra [name] is a 55 y.o. female who works at [employer].  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 Carlean Purl for abd pains that showed marginal ulcer (small bowel side of GJ).  Biopsies at that time showed no H. Plori. She was on PPI and was put on carafate QID.  She stopped both of those meds after 1-2 months and has not had trouble since.  Will periodically take ibuprofen (2-3 per week) but in the past 3-4 days she's been taking about 2,000mg  per day of ibuprofen for new neck pains.  Early this AM she awoke, vomiting red blood. Shortly afterwards melenic stools twice.  She called our office, I returned the call and instructed her to go to [facility] ER.  Here she is is feeling well, CBC shows Hb 11.3 (previously was 13).  BUN 44, Cr 0.5. She became dizzy and then brief syncope in ER when sitting upright. Repeat Hb was 9.5. VS have been otherwise stable with HR in 70s currently.  No overt bleeding but rectal exam in ER + gross melena.  Otherwise feels well without chest pains, abd pains.  She was already started on protonix bolus and drip.   Past Medical History:  Diagnosis Date  . Anxiety   . Arthritis   . CAD (coronary artery disease) NON-OBSTRUCTIVE PER DR HOCHREIN NOTE 08-26-2011  . Cancer [facility]) 2006   uterine  . Complication of anesthesia PT STATES HAS LETTER FROM [place] [facility]. STATING THAT SHE HAS A VERY SMALL AIRWAY (LAST SURG. HYSTERECTOMY AT [facility] IN 2006   PT STATES LYNN BEASON CRNA WHOM WORKS HERE TOLD SHE WOULD BE OKAY TO BE DONE AT [facility]  . Diabetes mellitus    no meds taken now, checks cbg once or twice a week   . 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 GERD (gastroesophageal reflux disease)   . Heart murmur    with last visit, MD states "outgrown" murmur  .  Hemorrhoids, internal, with bleeding and Grade 2 prolapse 03/05/2013   All positions seen an anoscopy RP columns banded 03/05/2013 LL and RA banded 03/18/2013     . History of endometrial cancer 2006 ---- S/P ABD. HYSTERECTOMY   STAGE I ENDOMETROID CARCINOMA  . Hx of adenomatous colonic polyps 2011   BENIGN  . Hyperlipidemia    no meds now  . Hypertension    no meds for last year  . Morbid obesity, weight - 203, BMI - 38.4 05/12/2013  . OSA on CPAP    cpap setting of 16  . Sleep apnea    no CPAP since surgery    Past Surgical History:  Procedure Laterality Date  . ABDOMINAL HYSTERECTOMY  12-20-2004   TAH  . BILATERAL BREAST REDUCTION  11-16-1999  . BREATH TEK H PYLORI N/A 05/27/2013   Procedure: BREATH TEK H PYLORI;  Surgeon: Shann Medal, MD;  Location: [facility];  Service: General;  Laterality: N/A;  . [place]  . COLONOSCOPY    . COLONOSCOPY N/A 04/25/2014   Procedure: COLONOSCOPY;  Surgeon: Gatha Mayer, MD;  Location: [facility];  Service: Endoscopy;  Laterality: N/A;  . CYSTOSCOPY  09/06/2011   Procedure: CYSTOSCOPY;  Surgeon: Ailene Rud, MD;  Location: [facility];  Service: Urology;  Laterality: N/A;  LYNX SLING   . ESOPHAGOGASTRODUODENOSCOPY (EGD) WITH PROPOFOL N/A 09/27/2014   Procedure: ESOPHAGOGASTRODUODENOSCOPY (EGD) WITH PROPOFOL;  Surgeon: Gatha Mayer, MD;  Location: [facility];  Service: Endoscopy;  Laterality: N/A;  . GASTRIC ROUX-EN-Y N/A 10/04/2013   Procedure: LAPAROSCOPIC ROUX-EN-Y GASTRIC BYPASS WITH UPPER ENDOSCOPY;  Surgeon: Shann Medal, MD;  Location: [facility];  Service: General;  Laterality: N/A;  . HEMORRHOID BANDING  02/2013  . PUBOVAGINAL SLING  09/06/2011   Procedure: Gaynelle Arabian;  Surgeon: Ailene Rud, MD;  Location: [facility];  Service: Urology;  Laterality: N/A;  . UVULOPALATOPHARYNGOPLASTY, TONSILLECTOMY AND SEPTOPLASTY  1994    Prior to Admission medications    Medication Sig Start Date End Date Taking? Authorizing Provider  Calcium Carb-Cholecalciferol (CALCIUM PLUS VITAMIN D3 PO) Take 1 tablet by mouth 3 (three) times daily.    Yes Historical Provider, MD  ibuprofen (ADVIL,MOTRIN) 200 MG tablet Take 400 mg by mouth every 6 (six) hours as needed for headache or mild pain.   Yes Historical Provider, MD  Multiple Vitamins-Minerals (MULTIVITAMIN PO) Take 1 tablet by mouth 2 (two) times daily.    Yes Historical Provider, MD  vitamin B-12 (CYANOCOBALAMIN) 1000 MCG tablet Take 1,000 mcg by mouth daily.   Yes Historical Provider, MD  HYDROcodone-acetaminophen (NORCO/VICODIN) 5-325 MG per tablet Take 1-2 every 4 hours as needed for pain. Patient not taking: Reported on 03/10/2016 09/22/14   Gatha Mayer, MD  ondansetron (ZOFRAN) 4 MG tablet TAKE 1 TABLET BY MOUTH EVERY 8 HOURS AS NEEDED FOR NAUSEA AND VOMITING (MAY TAKE 2 TABLETS IF 1 NOT EFFECTIVE) Patient not taking: Reported on 03/10/2016 07/31/15   Gatha Mayer, MD  pantoprazole (PROTONIX) 40 MG tablet Take 1 tablet (40 mg total) by mouth daily before breakfast. Patient not taking: Reported on 03/10/2016 09/19/14   Gatha Mayer, MD  PARoxetine (PAXIL-CR) 12.5 MG 24 hr tablet Take 1 tablet (12.5 mg total) by mouth every morning. Patient not taking: Reported on 03/10/2016 10/05/15   Terrance Mass, MD  sucralfate (CARAFATE) 1 G tablet Take 1 tablet (1 g total) by mouth 4 (four) times daily -  with meals and at bedtime. Patient not taking: Reported on 03/10/2016 09/27/14   Gatha Mayer, MD    Current Facility-Administered Medications  Medication Dose Route Frequency Provider Last Rate Last Dose  . pantoprazole (PROTONIX) 80 mg in sodium chloride 0.9 % 250 mL (0.32 mg/mL) infusion  8 mg/hr Intravenous Continuous Forde Dandy, MD 25 mL/hr at 03/10/16 1456 8 mg/hr at 03/10/16 1456   Current Outpatient Prescriptions  Medication Sig Dispense Refill  . Calcium Carb-Cholecalciferol (CALCIUM PLUS VITAMIN D3  PO) Take 1 tablet by mouth 3 (three) times daily.     Marland Kitchen ibuprofen (ADVIL,MOTRIN) 200 MG tablet Take 400 mg by mouth every 6 (six) hours as needed for headache or mild pain.    . Multiple Vitamins-Minerals (MULTIVITAMIN PO) Take 1 tablet by mouth 2 (two) times daily.     . vitamin B-12 (CYANOCOBALAMIN) 1000 MCG tablet Take 1,000 mcg by mouth daily.    Marland Kitchen HYDROcodone-acetaminophen (NORCO/VICODIN) 5-325 MG per tablet Take 1-2 every 4 hours as needed for pain. (Patient not taking: Reported on 03/10/2016) 30 tablet 0  . ondansetron (ZOFRAN) 4 MG tablet TAKE 1 TABLET BY MOUTH EVERY 8 HOURS AS NEEDED FOR NAUSEA AND VOMITING (MAY TAKE 2 TABLETS IF 1 NOT EFFECTIVE) (Patient not taking: Reported on 03/10/2016) 30 tablet 1  . pantoprazole (PROTONIX) 40  MG tablet Take 1 tablet (40 mg total) by mouth daily before breakfast. (Patient not taking: Reported on 03/10/2016) 30 tablet 2  . PARoxetine (PAXIL-CR) 12.5 MG 24 hr tablet Take 1 tablet (12.5 mg total) by mouth every morning. (Patient not taking: Reported on 03/10/2016) 30 tablet 11  . sucralfate (CARAFATE) 1 G tablet Take 1 tablet (1 g total) by mouth 4 (four) times daily -  with meals and at bedtime. (Patient not taking: Reported on 03/10/2016) 120 tablet 2    Allergies as of 03/10/2016  . (No Known Allergies)    Family History  Problem Relation Age of Onset  . Adopted: Yes    Social History   Social History  . Marital status: Single    Spouse name: N/A  . Number of children: N/A  . Years of education: N/A   Occupational History  . Not on file.   Social History Main Topics  . Smoking status: Never Smoker  . Smokeless tobacco: Never Used  . Alcohol use No  . Drug use: No  . Sexual activity: Yes    Birth control/ protection: Surgical     Comment: HYST.. 1st intercourse- 13, partners- 6   Other Topics Concern  . Not on file   Social History Narrative   Married, second time, has children and cares for granddaughter   Endoscopy Tech at  [employer]     Review of Systems: Pertinent positive and negative review of systems were noted in the above HPI section. Complete review of systems was performed and was otherwise normal.   Physical Exam: Vital signs in last 24 hours: Temp:  [99.3 F (37.4 C)-99.9 F (37.7 C)] 99.3 F (37.4 C) (11/12 1449) Pulse Rate:  [82-92] 82 (11/12 1449) Resp:  [13-16] 13 (11/12 1449) BP: (133-148)/(89-94) 133/94 (11/12 1449) SpO2:  [96 %-97 %] 97 % (11/12 1449) Weight:  [140 lb (63.5 kg)] 140 lb (63.5 kg) (11/12 1317)   Constitutional: generally well-appearing Psychiatric: alert and oriented x3 Eyes: extraocular movements intact Mouth: oral pharynx moist, no lesions Neck: supple no lymphadenopathy Cardiovascular: heart regular rate and rhythm Lungs: clear to auscultation bilaterally Abdomen: soft, nontender, nondistended, no obvious ascites, no peritoneal signs, normal bowel sounds Extremities: no lower extremity edema bilaterally Skin: no lesions on visible extremities   Lab Results:  Recent Labs  03/10/16 1323 03/10/16 1513  WBC 14.0*  --   HGB 11.3* 9.5*  HCT 33.9* 28.0*  PLT 330  --   MCV 92.6  --    BMET  Recent Labs  03/10/16 1323 03/10/16 1513  NA 142 144  K 3.9 3.7  CL 108 105  CO2 25  --   GLUCOSE 158* 172*  BUN 44* 41*  CREATININE 0.54 0.50  CALCIUM 8.8*  --    LFT  Recent Labs  03/10/16 1323  BILITOT 0.5  AST 20  ALT 18  ALKPHOS 42  PROT 6.7  ALBUMIN 4.0    Impression/Plan: 55 y.o. female with UGI bleed, likely NSAID related  H/o marginal ulcer at [place] roux-en-y anastomosis (2016).  Has not been on PPI.  Has been taking NSAID 2-3 times per week normally and then extreme amounts of ibuprofen for the past 3-4 days for neck pains. 12-16 ibuprofen pills per day.  Admit to step down, hospitalist was called already by ER.  Continue protonix drip.  Follow serial H/H.  NPO for now.  She is already set for EGD in the morning with Dr.  Hilarie Fredrickson.  Please call on call MD prior to then if any questions or concerns.    Milus Banister, MD  03/10/2016, 3:23 PM [facility] Pager [phone]

## 2016-03-11 NOTE — Transfer of Care (Signed)
Immediate Anesthesia Transfer of Care Note  Patient: Sandra [name]  Procedure(s) Performed: Procedure(s): ESOPHAGOGASTRODUODENOSCOPY (EGD) WITH PROPOFOL (N/A)  Patient Location: PACU  Anesthesia Type:MAC  Level of Consciousness: awake, alert  and oriented  Airway & Oxygen Therapy: Patient Spontanous Breathing and Patient connected to nasal cannula oxygen  Post-op Assessment: Report given to RN and Post -op Vital signs reviewed and stable  Post vital signs: Reviewed and stable  Last Vitals:  Vitals:   03/11/16 0726 03/11/16 0813  BP: 136/81 (!) 159/90  Pulse: 85 82  Resp: 19 14  Temp:  37.9 C    Last Pain:  Vitals:   03/11/16 0813  TempSrc: Oral  PainSc:          Complications: No apparent anesthesia complications

## 2016-03-11 NOTE — Anesthesia Preprocedure Evaluation (Addendum)
Anesthesia Evaluation  Patient identified by MRN, date of birth, ID band Patient awake    Reviewed: Allergy & Precautions, NPO status , Patient's Chart, lab work & pertinent test results  History of Anesthesia Complications (+) history of anesthetic complications  Airway Mallampati: II  TM Distance: >3 FB Neck ROM: Full    Dental  (+) Teeth Intact, Dental Advisory Given   Pulmonary sleep apnea and Continuous Positive Airway Pressure Ventilation ,    breath sounds clear to auscultation       Cardiovascular hypertension, + CAD   Rhythm:Regular Rate:Normal     Neuro/Psych PSYCHIATRIC DISORDERS Anxiety negative neurological ROS     GI/Hepatic Neg liver ROS, PUD, GERD  Medicated,  Endo/Other  diabetes  Renal/GU negative Renal ROS  negative genitourinary   Musculoskeletal  (+) Arthritis , Osteoarthritis,    Abdominal   Peds negative pediatric ROS (+)  Hematology negative hematology ROS (+)   Anesthesia Other Findings   Reproductive/Obstetrics negative OB ROS                            02/2016 EKG: normal sinus rhythm.  Anesthesia Physical Anesthesia Plan  ASA: III  Anesthesia Plan: MAC   Post-op Pain Management:    Induction: Intravenous  Airway Management Planned: Natural Airway  Additional Equipment:   Intra-op Plan:   Post-operative Plan:   Informed Consent: I have reviewed the patients History and Physical, chart, labs and discussed the procedure including the risks, benefits and alternatives for the proposed anesthesia with the patient or authorized representative who has indicated his/her understanding and acceptance.     Plan Discussed with: CRNA  Anesthesia Plan Comments:         Anesthesia Quick Evaluation

## 2016-03-11 NOTE — Anesthesia Postprocedure Evaluation (Signed)
Anesthesia Post Note  Patient: Sandra [name]  Procedure(s) Performed: Procedure(s) (LRB): ESOPHAGOGASTRODUODENOSCOPY (EGD) WITH PROPOFOL (N/A)  Patient location during evaluation: PACU Anesthesia Type: MAC Level of consciousness: awake and alert Pain management: pain level controlled Vital Signs Assessment: post-procedure vital signs reviewed and stable Respiratory status: spontaneous breathing, nonlabored ventilation, respiratory function stable and patient connected to nasal cannula oxygen Cardiovascular status: stable and blood pressure returned to baseline Anesthetic complications: no    Last Vitals:  Vitals:   03/11/16 1021 03/11/16 1200  BP: 132/84 122/70  Pulse: 81 91  Resp: 16 18  Temp:      Last Pain:  Vitals:   03/11/16 1200  TempSrc:   PainSc: 0-No pain                 Effie Berkshire

## 2016-03-11 NOTE — Progress Notes (Signed)
PROGRESS NOTE    Sandra [name]  [id number]  DOB: January 10, 1961  DOA: 03/10/2016 PCP: Stephens Shire, MD Outpatient Specialists:   Hospital course: 55 year old female who presents with hematemesis. She has a history of coronary artery disease, Roux-en-Y gastric bypass. Has been seen by Dr. Carlean Purl in the past with colonoscopy and endoscopy showing a peptic ulcer disease, colonic polyps, and internal hemorrhoids. Has no prior history of GI bleeding. States she has been taking ibuprofen around the clock for the past 3-4 days due to neck pain. Today at around 10 AM had 2 episodes of hematemesis of bright red blood with blood clots. Total estimating about 500 cc. since then she has also had 2 episodes of melanotic stool. Has been feeling weak and lightheaded since then. No chest pain or difficulty breathing. No significant abdominal pain.  Assessment & Plan:   1. Upper GI bleed - Hb trended down now to 8.1, tolerating protonix infusion and IVFs.  EGD planned for this morning. Dr. Hilarie Fredrickson.  2. Hypokalemia - added KCl to IVFs.   3. Syncopal episode - Likely related to acute blood loss, no recurrence, vitals have remained stable.   4. Chronic Neck Pain - Pt had been taking a lot of NSAIDS in past 3 days prior to admission.   5. NSAID use - see above 6. Leukocytosis - suspect reactive, no source of infection determined.    DVT prophylaxis: SCD Code Status: Full Disposition Plan: home   Consultants:  [facility]  Procedures:  EGD planned 11/13  Subjective: Pt without complaints.  No further syncopal or severe bleeding.    Objective: Vitals:   03/11/16 0400 03/11/16 0600 03/11/16 0658 03/11/16 0726  BP: 120/65 112/69  136/81  Pulse: 74 73  85  Resp: 14 14  19   Temp:   98.7 F (37.1 C)   TempSrc:   Oral   SpO2: 96% 97%  97%  Weight:      Height:        Intake/Output Summary (Last 24 hours) at 03/11/16 0803 Last data filed at 03/11/16 0700  Gross per 24 hour  Intake           3345.42 ml  Output              900 ml  Net          2445.42 ml   Filed Weights   03/10/16 1317  Weight: 63.5 kg (140 lb)    Exam:  General exam: awake, alert, no apparent distress, cooperative.  Dry MM.  Respiratory system: Clear. No increased work of breathing. Cardiovascular system: S1 & S2 heard, RRR. No JVD, murmurs, gallops, clicks or pedal edema. Gastrointestinal system: Abdomen is nondistended, soft and nontender. Normal bowel sounds heard. Central nervous system: Alert and oriented. No focal neurological deficits. Extremities: no CCE.  Data Reviewed: Basic Metabolic Panel:  Recent Labs Lab 03/10/16 1323 03/10/16 1513 03/11/16 0530  NA 142 144 143  K 3.9 3.7 3.3*  CL 108 105 115*  CO2 25  --  26  GLUCOSE 158* 172* 113*  BUN 44* 41* 31*  CREATININE 0.54 0.50 0.55  CALCIUM 8.8*  --  8.0*   Liver Function Tests:  Recent Labs Lab 03/10/16 1323 03/11/16 0530  AST 20 15  ALT 18 16  ALKPHOS 42 31*  BILITOT 0.5 0.5  PROT 6.7 5.3*  ALBUMIN 4.0 3.1*   No results for input(s): LIPASE, AMYLASE in the last 168 hours. No results for input(s): AMMONIA  in the last 168 hours. CBC:  Recent Labs Lab 03/10/16 1323 03/10/16 1513 03/10/16 1631 03/10/16 2328 03/11/16 0530  WBC 14.0*  --  11.4* 8.4 7.4  NEUTROABS 10.4*  --   --   --   --   HGB 11.3* 9.5* 9.3* 8.5* 8.1*  HCT 33.9* 28.0* 27.8* 25.5* 24.5*  MCV 92.6  --  93.0 91.1 91.4  PLT 330  --  247 246 230   Cardiac Enzymes: No results for input(s): CKTOTAL, CKMB, CKMBINDEX, TROPONINI in the last 168 hours. CBG (last 3)   Recent Labs  03/11/16 0744  GLUCAP 116*   Recent Results (from the past 240 hour(s))  MRSA PCR Screening     Status: None   Collection Time: 03/10/16  4:20 PM  Result Value Ref Range Status   MRSA by PCR NEGATIVE NEGATIVE Final    Comment:        The GeneXpert MRSA Assay (FDA approved for NASAL specimens only), is one component of a comprehensive MRSA  colonization surveillance program. It is not intended to diagnose MRSA infection nor to guide or monitor treatment for MRSA infections.      Studies: No results found.   Scheduled Meds: . ondansetron (ZOFRAN) IV  4 mg Intravenous Q8H   Continuous Infusions: . pantoprozole (PROTONIX) infusion 8 mg/hr (03/11/16 0129)  . 0.9 % sodium chloride with kcl      Active Problems:   UGIB (upper gastrointestinal bleed)   Upper GI bleed   Critical Care Time spent: 32 mins  Irwin Brakeman, MD, FAAFP [facility] Pager [phone]  If 7PM-7AM, please contact night-coverage [email or web address] Password [email or web address] 03/11/2016, 8:03 AM    LOS: 1 day

## 2016-03-11 NOTE — Progress Notes (Signed)
Pt tolerating Full Liquid Diet. Per GI nursing order PT diet advanced to regular.

## 2016-03-12 ENCOUNTER — Encounter (HOSPITAL_COMMUNITY): Payer: Self-pay | Admitting: Internal Medicine

## 2016-03-12 ENCOUNTER — Other Ambulatory Visit: Payer: Self-pay

## 2016-03-12 DIAGNOSIS — K259 Gastric ulcer, unspecified as acute or chronic, without hemorrhage or perforation: Secondary | ICD-10-CM

## 2016-03-12 DIAGNOSIS — D62 Acute posthemorrhagic anemia: Secondary | ICD-10-CM

## 2016-03-12 DIAGNOSIS — D72829 Elevated white blood cell count, unspecified: Secondary | ICD-10-CM

## 2016-03-12 DIAGNOSIS — D500 Iron deficiency anemia secondary to blood loss (chronic): Secondary | ICD-10-CM

## 2016-03-12 LAB — CBC
HCT: 24.2 % — ABNORMAL LOW (ref 36.0–46.0)
HCT: 26 % — ABNORMAL LOW (ref 36.0–46.0)
Hemoglobin: 8 g/dL — ABNORMAL LOW (ref 12.0–15.0)
Hemoglobin: 8.7 g/dL — ABNORMAL LOW (ref 12.0–15.0)
MCH: 30.2 pg (ref 26.0–34.0)
MCH: 30.7 pg (ref 26.0–34.0)
MCHC: 33.1 g/dL (ref 30.0–36.0)
MCHC: 33.5 g/dL (ref 30.0–36.0)
MCV: 91.3 fL (ref 78.0–100.0)
MCV: 91.9 fL (ref 78.0–100.0)
Platelets: 213 10*3/uL (ref 150–400)
Platelets: 234 10*3/uL (ref 150–400)
RBC: 2.65 MIL/uL — ABNORMAL LOW (ref 3.87–5.11)
RBC: 2.83 MIL/uL — ABNORMAL LOW (ref 3.87–5.11)
RDW: 12.7 % (ref 11.5–15.5)
RDW: 12.8 % (ref 11.5–15.5)
WBC: 6.6 10*3/uL (ref 4.0–10.5)
WBC: 6.9 10*3/uL (ref 4.0–10.5)

## 2016-03-12 LAB — BASIC METABOLIC PANEL
Anion gap: 4 — ABNORMAL LOW (ref 5–15)
BUN: 12 mg/dL (ref 6–20)
CO2: 27 mmol/L (ref 22–32)
Calcium: 8.6 mg/dL — ABNORMAL LOW (ref 8.9–10.3)
Chloride: 112 mmol/L — ABNORMAL HIGH (ref 101–111)
Creatinine, Ser: 0.6 mg/dL (ref 0.44–1.00)
GFR calc Af Amer: >60 mL/min (ref >60)
GFR calc non Af Amer: >60 mL/min (ref >60)
Glucose, Bld: 107 mg/dL — ABNORMAL HIGH (ref 65–99)
Potassium: 4.1 mmol/L (ref 3.5–5.1)
Sodium: 143 mmol/L (ref 135–145)

## 2016-03-12 MED ORDER — ESOMEPRAZOLE MAGNESIUM 40 MG PO CPDR: 40.0000 mg | DELAYED_RELEASE_CAPSULE | Freq: Two times a day (BID) | ORAL | 1 refills | Status: DC

## 2016-03-12 MED FILL — ESOMEPRAZOLE MAG DR 40 MG C: 40 | 30 days supply | Qty: 60 | Fill #0

## 2016-03-12 NOTE — Consult Note (Signed)
   [facility] CM Inpatient Consult   03/12/2016  CHRYSTEL DEGEUS 11-Mar-1961 [id number]    Came to visit Sandra [name] prior to discharge on behalf of Link to [facility] Care Management program for [facility] employees/dependents with [facility] insurance. Denies having any Link to Wellness needs. Appreciative of visit. Confirmed best contact number as [phone]. Provided Link to [employer], 24-hr nurse line magnet. Made Sandra [name] aware that she will be receiving a post hospital discharge call.    Marthenia Rolling, MSN-Ed, RN,BSN [facility] Liaison [phone]

## 2016-03-12 NOTE — Discharge Instructions (Signed)
Gastrointestinal Bleeding Introduction Gastrointestinal bleeding is bleeding somewhere along the path food travels through the body (digestive tract). This path is anywhere between the mouth and the opening of the butt (anus). You may have blood in your poop (stools) or have black poop. If you throw up (vomit), there may be blood in it. This condition can be mild, serious, or even life-threatening. If you have a lot of bleeding, you may need to stay in the hospital. Follow these instructions at home:  Take over-the-counter and prescription medicines only as told by your doctor.  Eat foods that have a lot of fiber in them. These foods include whole grains, fruits, and vegetables. You can also try eating 1-3 prunes each day.  Drink enough fluid to keep your pee (urine) clear or pale yellow.  Keep all follow-up visits as told by your doctor. This is important. Contact a doctor if:  Your symptoms do not get better. Get help right away if:  Your bleeding gets worse.  You feel dizzy or you pass out (faint).  You feel weak.  You have very bad cramps in your back or belly (abdomen).  You pass large clumps of blood (clots) in your poop.  Your symptoms are getting worse. This information is not intended to replace advice given to you by your health care provider. Make sure you discuss any questions you have with your health care provider. Document Released: 01/23/2008 Document Revised: 09/21/2015 Document Reviewed: 10/03/2014  2017 Elsevier   Upper Gastrointestinal Bleeding Upper gastrointestinal (GI) bleeding is bleeding from the swallowing tube (esophagus), stomach, or the first part of the small intestine (duodenum). If you have upper GI bleeding, you may vomit blood or have bloody or black stools. Bleeding can range from mild to serious or even life-threatening. If there is a lot of bleeding, you may need to stay in the hospital. What are the causes? This condition may be caused  by:  Ulcer disease of the stomach (peptic ulcer) or duodenum. This is the most common cause of GI bleeding.  Inflammation, irritation, or swelling of the esophagus (esophagitis).  A tear in the esophagus.  Cancer of the esophagus, stomach, or duodenum.  An abnormal or weakened blood vessel in one of the upper GI structures.  A bleeding disorder that impairs the formation of blood clots and causes easy bleeding (coagulopathy). What increases the risk? The following factors may make you more likely to develop this condition:  Being older than 56 years of age.  Being female.  Having another long-term disease, especially liver or kidney disease.  Having a stomach infection caused by Helicobacter pylori bacteria.  Having frequent or severe vomiting.  Abusing alcohol.  Taking certain medicines for a long time, such as:  NSAIDs.  Anticoagulants. What are the signs or symptoms? Symptoms of this condition include:  Vomiting blood.  Black or maroon-colored stools.  Bloody stools.  Weakness or dizziness.  Heartburn.  Abdominal pain.  Difficulty swallowing.  Weight loss.  Yellow eyes or skin (jaundice).  Racing heartbeat. How is this diagnosed? This condition may be diagnosed based on:  Your symptoms and medical history.  A physical exam. During the exam, your health care provider will check for signs of blood loss, such as low blood pressure and a rapid pulse.  Tests, such as:  Blood tests to measure your blood cell count and to check for other signs of blood loss and clotting ability.  Blood tests to check your liver and kidney function.  A chest X-ray to look for a tear in the esophagus.  Endoscopy. In this procedure, a flexible scope is put down your esophagus and into your stomach or duodenum to look for the source of bleeding.  Angiogram. This may be done if the source of bleeding is not found during endoscopy. For an angiogram, X-rays are taken after a  dye is injected into your bloodstream.  Nasogastric tube insertion. This is a tube passed through your nose and down into your stomach. It may be connected to a source of gentle suction to see if any blood comes out. How is this treated? Treatment for this condition depends on the cause of the bleeding. Active bleeding is treated at the hospital. Treatment may include:  Getting fluids through an IV tube inserted into one of your veins.  Getting blood through an IV tube (blood transfusion).  Getting high doses of medicine through the IV to lower stomach acid. This may be done to treat ulcer disease.  Having endoscopy to treat an area of bleeding with high heat (coagulation), injections, or surgical clips.  Having a procedure that involves first doing an angiogram and then blocking blood flow to the bleeding site (embolization).  Stopping or changing some of your regular medicines for a certain amount of time.  Having other surgical procedures if initial treatments do not control bleeding. Follow these instructions at home:  Take over-the-counter and prescription medicines only as told by your health care provider. You may need to avoid NSAIDs or other medicines that increase bleeding.  Do not drink alcohol.  Drink enough fluid to keep your urine clear or pale yellow.  Follow instructions from your health care provider about eating or drinking restrictions.  Return to your normal activities as told by your health care provider. Ask your health care provider what activities are safe for you.  Do not use any tobacco products, such as cigarettes, chewing tobacco, and e-cigarettes. If you need help quitting, ask your health care provider.  Keep all follow-up visits as told by your health care provider. This is important. Contact a health care provider if:  You have abdominal pain or heartburn.  You have unexplained weight loss.  You have trouble swallowing.  You have frequent  vomiting.  You develop jaundice.  You feel weak or dizzy.  You need help to stop smoking or drinking alcohol. Get help right away if:  You have vomiting with blood.  You have blood in your stools.  You have severe cramps in your back or abdomen.  Your symptoms of upper GI bleeding come back after treatment. This information is not intended to replace advice given to you by your health care provider. Make sure you discuss any questions you have with your health care provider. Document Released: 08/31/2015 Document Revised: 12/17/2015 Document Reviewed: 08/31/2015 Elsevier Interactive Patient Education  2017 [employer].   Iron Deficiency Anemia, Adult Iron-deficiency anemia is when you have a low amount of red blood cells or hemoglobin. This happens because you have too little iron in your body. Hemoglobin carries oxygen to parts of the body. Anemia can cause your body to not get enough oxygen. It may or may not cause symptoms. Follow these instructions at home: Medicines  Take over-the-counter and prescription medicines only as told by your doctor. This includes iron pills (supplements) and vitamins.  If you cannot handle taking iron pills by mouth, ask your doctor about getting iron through:  A vein (intravenously).  A shot (injection)  into a muscle.  Take iron pills when your stomach is empty. If you cannot handle this, take them with food.  Do not drink milk or take antacids at the same time as your iron pills.  To prevent trouble pooping (constipation), eat fiber or take medicine (stool softener) as told by your doctor. Eating and drinking  Talk with your doctor before changing the foods you eat. He or she may tell you to eat foods that have a lot of iron, such as:  Liver.  Lowfat (lean) beef.  Breads and cereals that have iron added to them (fortified breads and cereals).  Eggs.  Dried fruit.  Dark green, leafy vegetables.  Drink enough fluid to keep your  pee (urine) clear or pale yellow.  Eat fresh fruits and vegetables that are high in vitamin C. They help your body to use iron. Foods with a lot of vitamin C include:  Oranges.  Peppers.  Tomatoes.  Mangoes. General instructions  Return to your normal activities as told by your doctor. Ask your doctor what activities are safe for you.  Keep yourself clean, and keep things clean around you (your surroundings). Anemia can make you get sick more easily.  Keep all follow-up visits as told by your doctor. This is important. Contact a doctor if:  You feel sick to your stomach (nauseous).  You throw up (vomit).  You feel weak.  You are sweating for no clear reason.  You have trouble pooping, such as:  Pooping (having a bowel movement) less than 3 times a week.  Straining to poop.  Having poop that is hard, dry, or larger than normal.  Feeling full or bloated.  Pain in the lower belly.  Not feeling better after pooping. Get help right away if:  You pass out (faint). If this happens, do not drive yourself to the hospital. Call your local emergency services (911 in the [place]  You have chest pain.  You have shortness of breath that:  Is very bad.  Gets worse with physical activity.  You have a fast heartbeat.  You get light-headed when getting up from sitting or lying down. This information is not intended to replace advice given to you by your health care provider. Make sure you discuss any questions you have with your health care provider. Document Released: 05/18/2010 Document Revised: 01/03/2016 Document Reviewed: 01/03/2016 Elsevier Interactive Patient Education  2017 [employer].

## 2016-03-12 NOTE — Progress Notes (Signed)
Pt discharged home in stable condition. Discharge instructions given. Pt verbalized understanding. No immediate questions or concerns. 

## 2016-03-12 NOTE — Discharge Summary (Signed)
Physician Discharge Summary  KEEMA VELA [id number] DOB: 27-Jun-1960 DOA: 03/10/2016  PCP: Stephens Shire, MD GI: Pyrtle Velora Heckler)  Admit date: 03/10/2016 Discharge date: 03/12/2016  Admitted From:  Home  Disposition:  Home    Recommendations for Outpatient Follow-up:  1. Follow up with PCP in 1 weeks 2. Follow up with GI in 1 month 3. Please obtain CBC in one week 4. Please follow up on the following pending results: H. Pylori testing (pending at time of discharge)  Discharge Condition: Stable  CODE STATUS: Full  Brief/Interim Summary: Hospital course: 55 year old female who presents with hematemesis. She has a history of coronary artery disease, Roux-en-Y gastric bypass. Has been seen by Dr. Carlean Purl in the past with colonoscopy and endoscopy showing a peptic ulcer disease, colonic polyps, and internal hemorrhoids. Has no prior history of GI bleeding. States she has been taking ibuprofen around the clock for the past 3-4 days due to neck pain. Today at around 10 AM had 2 episodes of hematemesis of bright red blood with blood clots. Total estimating about 500 cc. since then she has also had 2 episodes of melanotic stool. Has been feeling weak and lightheaded since then. No chest pain or difficulty breathing. No significant abdominal pain.  Assessment & Plan:   1. Upper GI bleed - Hb remained stable at 8. EGD revealed some ulcers (see report below by Dr. Hilarie Fredrickson).  GI recommending capsule PPI, sprinkle over food BID and continue carafate slurry with meals, follow up with GI outpatient, recheck CBC with PCP next week.  Follow up H Pylori testing with PCP and / or GI.   AVOID NSAIDS.  2. Hypokalemia - added KCl to IVFs and repleted prior to discharge.   3. Syncopal episode - Likely related to acute blood loss, no recurrence, vitals have remained stable and no recurrence.   4. Chronic Neck Pain - Pt had been taking a lot of NSAIDS in past 3 days prior to admission.  Pt advised to Avoid  all NSAIDS. Pt verbalized understanding.  5. NSAID use - suspect this caused the ulcers and bleeding.  6. Leukocytosis - suspect reactive, no source of infection determined. Resolved now.    Discharge Diagnoses:  Active Problems:   UGIB (upper gastrointestinal bleed)   Upper GI bleed  Discharge Instructions  Discharge Instructions    Increase activity slowly    Complete by:  As directed        Medication List    STOP taking these medications   HYDROcodone-acetaminophen 5-325 MG tablet Commonly known as:  NORCO/VICODIN   ibuprofen 200 MG tablet Commonly known as:  ADVIL,MOTRIN   ondansetron 4 MG tablet Commonly known as:  ZOFRAN   pantoprazole 40 MG tablet Commonly known as:  PROTONIX   PARoxetine 12.5 MG 24 hr tablet Commonly known as:  PAXIL-CR     TAKE these medications   CALCIUM PLUS VITAMIN D3 PO Take 1 tablet by mouth 3 (three) times daily.   esomeprazole 40 MG capsule Commonly known as:  NEXIUM Take 1 capsule (40 mg total) by mouth 2 (two) times daily before a meal.   MULTIVITAMIN PO Take 1 tablet by mouth 2 (two) times daily.   sucralfate 1 g tablet Commonly known as:  CARAFATE Take 1 tablet (1 g total) by mouth 4 (four) times daily -  with meals and at bedtime.   vitamin B-12 1000 MCG tablet Commonly known as:  CYANOCOBALAMIN Take 1,000 mcg by mouth daily.  Follow-up Information    [facility] DEPT Follow up.   Specialty:  Emergency Medicine Why:  If symptoms worsen Contact information: [id number] [place] [phone]       Jerene Bears, MD. Schedule an appointment as soon as possible for a visit in 1 month(s).   Specialty:  Gastroenterology Why:  Hospital Follow Up  Contact information: [address] [place] [address] [phone]        BURNETT,BRENT A, MD Follow up in 1 week(s).   Specialty:  Family Medicine Why:  Hospital Follow Up,  Check CBC Contact information: [address] [place] [address] [phone]          No Known Allergies  Procedures/Studies:  EGD:  Impression:       - Normal esophagus.                           - Z-line regular, 39 cm from the incisors.                           - Normal mucosa was found in the gastric pouch.                           - Roux-en-Y gastrojejunostomy with clean-based                            anastomotic ulcers (as described above).                           - Normal examined jejunum.                           - No specimens collected. Moderate Sedation:      N/A Recommendation: - Return patient to hospital ward for ongoing care.                           - Advance diet as tolerated.                           - No aspirin, ibuprofen, naproxen, or other                            non-steroidal anti-inflammatory drugs.                           - Change to twice daily PO PPI in capsule form                            which can be opened and sprinkled on pudding or                            applesauce (to enhance absorption).                           - Resume Carafate (make into slurry) three times  daily before meals and at bedtime.                           - Monitor Hgb closely to ensure                            improvement/normalization.                           - Check H. Pylori stool Ag and treat if positive                            (though NSAID use is felt most likely cause of                            ulcers). Subjective: Pt tolerating regular diet.  Pt has been stable and ready to go home today.   Discharge Exam: Vitals:   03/11/16 2100 03/12/16 0533  BP: 127/85 114/70  Pulse: 81 68  Resp: 14 15  Temp: 98.4 F (36.9 C) 97.9 F (36.6 C)   Vitals:   03/11/16 1200 03/11/16 1435 03/11/16 2100 03/12/16 0533  BP: 122/70 133/75 127/85 114/70  Pulse: 91 85 81 68  Resp: 18 16 14 15   Temp: 99  F (37.2 C) 98.5 F (36.9 C) 98.4 F (36.9 C) 97.9 F (36.6 C)  TempSrc: Oral Oral Oral Oral  SpO2: 96% 98% 97% 98%  Weight:  63.5 kg (140 lb)    Height:  5\' 2"  (1.575 m)      General: Pt is alert, awake, not in acute distress Cardiovascular: RRR, S1/S2 +, no rubs, no gallops Respiratory: CTA bilaterally, no wheezing, no rhonchi Abdominal: Soft, NT, ND, bowel sounds + Extremities: no edema, no cyanosis  The results of significant diagnostics from this hospitalization (including imaging, microbiology, ancillary and laboratory) are listed below for reference.     Microbiology: Recent Results (from the past 240 hour(s))  MRSA PCR Screening     Status: None   Collection Time: 03/10/16  4:20 PM  Result Value Ref Range Status   MRSA by PCR NEGATIVE NEGATIVE Final    Comment:        The GeneXpert MRSA Assay (FDA approved for NASAL specimens only), is one component of a comprehensive MRSA colonization surveillance program. It is not intended to diagnose MRSA infection nor to guide or monitor treatment for MRSA infections.      Labs: BNP (last 3 results) No results for input(s): BNP in the last 8760 hours. Basic Metabolic Panel:  Recent Labs Lab 03/10/16 1323 03/10/16 1513 03/11/16 0530 03/12/16 0344  NA 142 144 143 143  K 3.9 3.7 3.3* 4.1  CL 108 105 115* 112*  CO2 25  --  26 27  GLUCOSE 158* 172* 113* 107*  BUN 44* 41* 31* 12  CREATININE 0.54 0.50 0.55 0.60  CALCIUM 8.8*  --  8.0* 8.6*   Liver Function Tests:  Recent Labs Lab 03/10/16 1323 03/11/16 0530  AST 20 15  ALT 18 16  ALKPHOS 42 31*  BILITOT 0.5 0.5  PROT 6.7 5.3*  ALBUMIN 4.0 3.1*   No results for input(s): LIPASE, AMYLASE in the last 168 hours. No results for input(s): AMMONIA in the last 168 hours. CBC:  Recent Labs Lab 03/10/16 1323  03/10/16 1631 03/10/16 2328 03/11/16 0530 03/11/16 1615 03/12/16 0344  WBC 14.0*  --  11.4* 8.4 7.4 7.7 6.6  NEUTROABS 10.4*  --   --   --   --    --   --   HGB 11.3*  < > 9.3* 8.5* 8.1* 8.7* 8.0*  HCT 33.9*  < > 27.8* 25.5* 24.5* 26.3* 24.2*  MCV 92.6  --  93.0 91.1 91.4 91.3 91.3  PLT 330  --  247 246 230 259 213  < > = values in this interval not displayed. Cardiac Enzymes: No results for input(s): CKTOTAL, CKMB, CKMBINDEX, TROPONINI in the last 168 hours. BNP: Invalid input(s): POCBNP CBG:  Recent Labs Lab 03/11/16 0744  GLUCAP 116*   D-Dimer No results for input(s): DDIMER in the last 72 hours. Hgb A1c No results for input(s): HGBA1C in the last 72 hours. Lipid Profile No results for input(s): CHOL, HDL, LDLCALC, TRIG, CHOLHDL, LDLDIRECT in the last 72 hours. Thyroid function studies No results for input(s): TSH, T4TOTAL, T3FREE, THYROIDAB in the last 72 hours.  Invalid input(s): FREET3 Anemia work up No results for input(s): VITAMINB12, FOLATE, FERRITIN, TIBC, IRON, RETICCTPCT in the last 72 hours. Urinalysis    Component Value Date/Time   COLORURINE YELLOW 10/05/2015 0911   APPEARANCEUR CLEAR 10/05/2015 0911   LABSPEC 1.024 10/05/2015 0911   PHURINE 6.0 10/05/2015 0911   GLUCOSEU NEGATIVE 10/05/2015 0911   HGBUR NEGATIVE 10/05/2015 0911   BILIRUBINUR NEGATIVE 10/05/2015 0911   KETONESUR NEGATIVE 10/05/2015 0911   PROTEINUR NEGATIVE 10/05/2015 0911   UROBILINOGEN 1 06/22/2014 1437   NITRITE NEGATIVE 10/05/2015 0911   LEUKOCYTESUR 1+ (A) 10/05/2015 0911   Sepsis Labs Invalid input(s): PROCALCITONIN,  WBC,  LACTICIDVEN Microbiology Recent Results (from the past 240 hour(s))  MRSA PCR Screening     Status: None   Collection Time: 03/10/16  4:20 PM  Result Value Ref Range Status   MRSA by PCR NEGATIVE NEGATIVE Final    Comment:        The GeneXpert MRSA Assay (FDA approved for NASAL specimens only), is one component of a comprehensive MRSA colonization surveillance program. It is not intended to diagnose MRSA infection nor to guide or monitor treatment for MRSA infections.    Time coordinating  discharge: 29 minutes  SIGNED:  Irwin Brakeman, MD  [facility] 03/12/2016, 8:44 AM  If 7PM-7AM, please contact night-coverage [email or web address] Password [email or web address]

## 2016-03-12 NOTE — Progress Notes (Signed)
Patient ID: Sandra [name], female   DOB: 02-13-1961, 55 y.o.   MRN: [id number]    Progress Note   Subjective   Feels ok, weak, some lightheadedness on standing. Had stool last evening with clots - none since but feel gassy , nauseated this am. HGB 8.0 this am - down 6 grams from her baseline   Objective   Vital signs in last 24 hours: Temp:  [97.9 F (36.6 C)-99 F (37.2 C)] 97.9 F (36.6 C) (11/14 0533) Pulse Rate:  [68-91] 68 (11/14 0533) Resp:  [14-18] 15 (11/14 0533) BP: (114-133)/(70-85) 114/70 (11/14 0533) SpO2:  [96 %-98 %] 98 % (11/14 0533) Weight:  [140 lb (63.5 kg)] 140 lb (63.5 kg) (11/13 1435) Last BM Date: 03/11/16 General:    white female in NAD Heart:  Regular rate and rhythm; no murmurs Lungs: Respirations even and unlabored, lungs CTA bilaterally Abdomen:  Soft, nontender and nondistended. Normal bowel sounds. Extremities:  Without edema. Neurologic:  Alert and oriented,  grossly normal neurologically. Psych:  Cooperative. Normal mood and affect.  Intake/Output from previous day: 11/13 0701 - 11/14 0700 In: 1306.7 [I.V.:1306.7] Out: -  Intake/Output this shift: No intake/output data recorded.  Lab Results:  Recent Labs  03/11/16 0530 03/11/16 1615 03/12/16 0344  WBC 7.4 7.7 6.6  HGB 8.1* 8.7* 8.0*  HCT 24.5* 26.3* 24.2*  PLT 230 259 213   BMET  Recent Labs  03/10/16 1323 03/10/16 1513 03/11/16 0530 03/12/16 0344  NA 142 144 143 143  K 3.9 3.7 3.3* 4.1  CL 108 105 115* 112*  CO2 25  --  26 27  GLUCOSE 158* 172* 113* 107*  BUN 44* 41* 31* 12  CREATININE 0.54 0.50 0.55 0.60  CALCIUM 8.8*  --  8.0* 8.6*   LFT  Recent Labs  03/11/16 0530  PROT 5.3*  ALBUMIN 3.1*  AST 15  ALT 16  ALKPHOS 31*  BILITOT 0.5   PT/INR  Recent Labs  03/11/16 0530  LABPROT 14.0  INR 1.08    Studies/Results: No results found.     Assessment / Plan:    #1 55 yo yo female s/p gastric bypass with  Acute GI bleed secondary to 3 anastomotic  ulcers  In setting of chronic NSAID use. Ulcers were clean based at time of  EGD yesterday  She has had a marked drop in HGB , though relatively stable past 12 hours - some concern for ongoing bleeding with passage of clots last pm  Plan; Do not want pt to be discharged until later today - needs another hgb this afternoon to assure stable Will follow up this afternoon. Important that she get Nexium on discharge , and open capsule for dosing to increase absorption . We will follow hgb serially as outpt  as well  Active Problems:   UGIB (upper gastrointestinal bleed)   Upper GI bleed     LOS: 2 days   Amy Esterwood  03/12/2016, 10:51 AM

## 2016-03-13 ENCOUNTER — Other Ambulatory Visit: Payer: Self-pay | Admitting: Internal Medicine

## 2016-03-13 LAB — H. PYLORI ANTIGEN, STOOL: H. Pylori Stool Ag, Eia: NEGATIVE

## 2016-03-13 MED FILL — SUCRALFATE 1 GM TABLET: 1 | 30 days supply | Qty: 120 | Fill #0

## 2016-03-13 NOTE — Telephone Encounter (Signed)
How many refills Sir? 

## 2016-03-13 NOTE — Telephone Encounter (Signed)
Refill x 12 

## 2016-03-14 MED FILL — ONDANSETRON HCL 4 MG TABLET: 4 | 5 days supply | Qty: 30 | Fill #1

## 2016-03-18 ENCOUNTER — Other Ambulatory Visit (INDEPENDENT_AMBULATORY_CARE_PROVIDER_SITE_OTHER): Payer: 59

## 2016-03-18 DIAGNOSIS — D62 Acute posthemorrhagic anemia: Secondary | ICD-10-CM

## 2016-03-18 LAB — CBC WITH DIFFERENTIAL/PLATELET
Basophils Absolute: 0.1 10*3/uL (ref 0.0–0.1)
Basophils Relative: 0.8 % (ref 0.0–3.0)
Eosinophils Absolute: 0.3 10*3/uL (ref 0.0–0.7)
Eosinophils Relative: 4 % (ref 0.0–5.0)
HCT: 30.8 % — ABNORMAL LOW (ref 36.0–46.0)
Hemoglobin: 10.2 g/dL — ABNORMAL LOW (ref 12.0–15.0)
Lymphocytes Relative: 43.1 % (ref 12.0–46.0)
Lymphs Abs: 3.2 10*3/uL (ref 0.7–4.0)
MCHC: 33.3 g/dL (ref 30.0–36.0)
MCV: 93.8 fl (ref 78.0–100.0)
Monocytes Absolute: 0.5 10*3/uL (ref 0.1–1.0)
Monocytes Relative: 6.5 % (ref 3.0–12.0)
Neutro Abs: 3.4 10*3/uL (ref 1.4–7.7)
Neutrophils Relative %: 45.6 % (ref 43.0–77.0)
Platelets: 387 10*3/uL (ref 150.0–400.0)
RBC: 3.28 Mil/uL — ABNORMAL LOW (ref 3.87–5.11)
RDW: 14.3 % (ref 11.5–15.5)
WBC: 7.4 10*3/uL (ref 4.0–10.5)

## 2016-03-25 ENCOUNTER — Other Ambulatory Visit: Payer: Self-pay | Admitting: *Deleted

## 2016-03-25 NOTE — Patient Outreach (Signed)
[facility]) Care Management  03/25/2016  Sandra [name] 11/02/1960 [id number]   Subjective: Telephone call to patient's home/ mobile number, no answer, left HIPAA compliant voicemail message, and requested call back.   Objective: Per chart review: Patient hospitalized 03/10/16 - 03/12/16 for upper gastrointestinal bleed.    Patient voluntary dis-enrolled from [employer] diabetes program due to diabetes no longer an issue due to surgery on 08/17/15.     Assessment: Received [facility] Transition of care follow up referral on 03/12/16.   Transition of care follow up pending patient contact.   Plan: RNCM will call patient for 2nd telephone outreach attempt, within 10 business days, if no return call.   [name] H. Annia Friendly, BSN, [place] [facility] Telephonic CM Phone: [phone] Fax: [phone]

## 2016-03-26 ENCOUNTER — Other Ambulatory Visit: Payer: Self-pay | Admitting: *Deleted

## 2016-03-26 NOTE — Patient Outreach (Signed)
[facility]) Care Management  03/26/2016  Sandra [name] 04-27-1961 [id number]   Subjective: Telephone call to patient's home/ mobile number, no answer, left HIPAA compliant voicemail message, and requested call back.   Objective: Per chart review: Patient hospitalized 03/10/16 - 03/12/16 for upper gastrointestinal bleed.    Patient voluntary dis-enrolled from [employer] diabetes program due to diabetes no longer an issue due to surgery on 08/17/15.     Assessment: Received UMR Transition of care follow up referral on 03/12/16.   Transition of care follow up pending patient contact.   Plan: RNCM will call patient for 3rd telephone outreach attempt, within 10 business days, if no return call.   Amarissa Koerner H. Annia Friendly, BSN, [place] [facility] Telephonic CM Phone: [phone] Fax: [phone]

## 2016-03-27 ENCOUNTER — Other Ambulatory Visit: Payer: Self-pay | Admitting: *Deleted

## 2016-03-27 ENCOUNTER — Encounter: Payer: Self-pay | Admitting: *Deleted

## 2016-03-27 NOTE — Patient Outreach (Signed)
[facility]) Care Management  03/27/2016  Sandra [name] 1960-08-04 [id number]   Subjective: Received voicemail from patient, states she is returning call, will not be available until after 4:30pm today, and will try to call back.   Objective: Per chart review: Patient hospitalized 03/10/16 - 03/12/16 for upper gastrointestinal bleed. Patient voluntary dis-enrolled from Link to Wellness diabetes program due to diabetes no longer an issue due to surgery on 08/17/15.    Assessment:Received UMR Transition of care follow up referral on 03/12/16. Transition of care follow up pending patient contact.   Plan: RNCM will proceed with case closure if no return call from patient, within 10 business days.   Sandra [name] H. Annia Friendly, BSN, [place] Management [facility] Telephonic CM Phone: [phone] Fax: [phone]

## 2016-03-27 NOTE — Patient Outreach (Signed)
Sandra [name] [facility]) Care Management  03/27/2016  Sandra [name] Jul 28, 1960 [id number]   Subjective: Telephone call to patient's home/ mobile number, no answer, left HIPAA compliant voicemail message, and requested call back.   Objective: Per chart review: Patient hospitalized 03/10/16 - 03/12/16 for upper gastrointestinal bleed. Patient voluntary dis-enrolled from Link to Wellness diabetes program due to diabetes no longer an issue due to surgery on 08/17/15.    Assessment:Received UMR Transition of care follow up referral on 03/12/16. Transition of care follow up pending patient contact.   Plan: RNCM will send patient unsuccessful outreach letter,  [facility] pamphlet, and proceed with case closure if no return call, within 10 business days.   Sandra [name], BSN, [place] Management [facility] Telephonic CM Phone: [phone] Fax: [phone]

## 2016-03-28 NOTE — Progress Notes (Signed)
[name] [address] [place], [place] [address] Phone: [phone] Subjective:    I'm seeing this patient by the request  of:  BURNETT,BRENT A, MD   CC: Neck pain  [id number]  Sandra [name] is a 55 y.o. female coming in with complaint of neck pain. Past Medical history significant for CAD as well as gastric bypass surgery. Patient has had neck pain for approximately 2 years. States that it has been worsening over the course of time. States that by the end of the day she is unable to even move her neck. Patient rates the severity pain is 8 out of 10. States that sometimes mild radiation or tightness in the shoulder region but never passer arms. Denies any weakness. States that the pain seems to be somewhat worse at night. Sometimes can give some mild headaches. Denies any visual changes. Denies any severe pain and stops her from activities though. Patient has been taking ibuprofen on a very regular basis but is concerned with her stomach. Ibuprofen does help.  Patient is also complaining of some mild right knee pain. Very minimal overall. States that sometimes she has difficulty bending it. Seems to be getting somewhat worse. Sometimes feel like there is a fullness. Denies any instability. Denies any radiation of pain. Does not remember any true injury. Does not stop her from any activities. Sometimes can have cramping around this knee at night.    Past Medical History:  Diagnosis Date  . Anxiety   . Arthritis   . CAD (coronary artery disease) NON-OBSTRUCTIVE PER DR HOCHREIN NOTE 08-26-2011  . Cancer [facility]) 2006   uterine  . Complication of anesthesia PT STATES HAS LETTER FROM [place] [facility]. STATING THAT SHE HAS A VERY SMALL AIRWAY (LAST SURG. HYSTERECTOMY AT [facility] IN 2006   PT STATES LYNN BEASON CRNA WHOM WORKS HERE TOLD SHE WOULD BE OKAY TO BE DONE AT [facility]  . Diabetes mellitus    no meds taken now, checks cbg once or twice a week   . Gastrojejunal  ulcer 09/27/2014   marginal ulcer  . GERD (gastroesophageal reflux disease)   . Heart murmur    with last visit, MD states "outgrown" murmur  . Hemorrhoids, internal, with bleeding and Grade 2 prolapse 03/05/2013   All positions seen an anoscopy RP columns banded 03/05/2013 LL and RA banded 03/18/2013     . History of endometrial cancer 2006 ---- S/P ABD. HYSTERECTOMY   STAGE I ENDOMETROID CARCINOMA  . Hx of adenomatous colonic polyps 2011   BENIGN  . Hyperlipidemia    no meds now  . Hypertension   . Morbid obesity, weight - 203, BMI - 38.4 05/12/2013  . OSA on CPAP    cpap setting of 16  . Sleep apnea    no CPAP since surgery   Past Surgical History:  Procedure Laterality Date  . ABDOMINAL HYSTERECTOMY  12-20-2004   TAH  . BILATERAL BREAST REDUCTION  11-16-1999  . BREATH TEK H PYLORI N/A 05/27/2013   Procedure: BREATH TEK H PYLORI;  Surgeon: Shann Medal, MD;  [facility];  Service: General;  Laterality: N/A;  . [place]  . COLONOSCOPY    . COLONOSCOPY N/A 04/25/2014   Procedure: COLONOSCOPY;  Surgeon: Gatha Mayer, MD;  [facility]: [facility];  Service: Endoscopy;  Laterality: N/A;  . CYSTOSCOPY  09/06/2011   Procedure: CYSTOSCOPY;  Surgeon: Ailene Rud, MD;  Location[facility];  Service: Urology;  Laterality: N/A;  LYNX SLING   . ESOPHAGOGASTRODUODENOSCOPY (EGD) WITH PROPOFOL N/A 09/27/2014   Procedure: ESOPHAGOGASTRODUODENOSCOPY (EGD) WITH PROPOFOL;  Surgeon: Gatha Mayer, MD;  [facility];  Service: Endoscopy;  Laterality: N/A;  . ESOPHAGOGASTRODUODENOSCOPY (EGD) WITH PROPOFOL N/A 03/11/2016   Procedure: ESOPHAGOGASTRODUODENOSCOPY (EGD) WITH PROPOFOL;  Surgeon: Jerene Bears, MD;  [facility];  Service: Endoscopy;  Laterality: N/A;  . GASTRIC ROUX-EN-Y N/A 10/04/2013   Procedure: LAPAROSCOPIC ROUX-EN-Y GASTRIC BYPASS WITH UPPER ENDOSCOPY;  Surgeon: Shann Medal, MD;  [facility];  Service:  General;  Laterality: N/A;  . HEMORRHOID BANDING  02/2013  . PUBOVAGINAL SLING  09/06/2011   Procedure: Gaynelle Arabian;  Surgeon: Ailene Rud, MD;  Location: [facility];  Service: Urology;  Laterality: N/A;  . UVULOPALATOPHARYNGOPLASTY, TONSILLECTOMY AND SEPTOPLASTY  1994   Social History   Social History  . Marital status: Single    Spouse name: N/A  . Number of children: N/A  . Years of education: N/A   Social History Main Topics  . Smoking status: Never Smoker  . Smokeless tobacco: Never Used  . Alcohol use No  . Drug use: No  . Sexual activity: Yes    Birth control/ protection: Surgical     Comment: HYST.. 1st intercourse- 13, partners- 6   Other Topics Concern  . 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 Adopted: Yes    Past medical history, social, surgical and family history all reviewed in electronic medical record.  No pertanent information unless stated regarding to the chief complaint.   Review of Systems:Review of systems updated and as accurate as of 03/29/16  No headache, visual changes, nausea, vomiting, diarrhea, constipation, dizziness, abdominal pain, skin rash, fevers, chills, night sweats, weight loss, swollen lymph nodes,  chest pain, shortness of breath, mood changes.   Objective  Blood pressure 138/82, pulse 76, height 5\' 2"  (1.575 m), weight 140 lb 12.8 oz (63.9 kg). Systems examined below as of 03/29/16   General: No apparent distress alert and oriented x3 mood and affect normal, dressed appropriately.  HEENT: Pupils equal, extraocular movements intact  Respiratory: Patient's speak in full sentences and does not appear short of breath  Cardiovascular: No lower extremity edema, non tender, no erythema  Skin: Warm dry intact with no signs of infection  or rash on extremities or on axial skeleton.  Abdomen: Soft nontender  Neuro: Cranial nerves II through XII are intact, neurovascularly intact in all extremities with 2+ DTRs and 2+ pulses.  Lymph: No lymphadenopathy of posterior or anterior cervical chain or axillae bilaterally.  Gait normal with good balance and coordination.  MSK:  Non tender with full range of motion and good stability and symmetric strength and tone of shoulders, elbows, wrist, hip, and ankles bilaterally. Mild arthritic changes Neck: Inspection unremarkable. No palpable stepoffs. Negative Spurling's maneuver. Patient though does have discomfort and does have involuntary guarding Significant decreased range of motion. Patient lacks last 10 of side bending and rotation bilaterally. Abdomen tightness of the musculature surrounding the area. Grip strength and sensation normal in bilateral hands Strength good C4 to T1 distribution No sensory change to C4 to T1 Negative Hoffman sign bilaterally Reflexes normal  Knee: Right Mild arthritic changes of the right knee. Patient does have  atrophy of the leg musculature bilaterally Mild discomfort over the right medial joint line ROM full in flexion and extension and lower leg rotation. Ligaments with solid consistent endpoints including ACL, PCL, LCL, MCL. Negative Mcmurray's, Apley's, and Thessalonian tests. Mild painful patellar compression. Patellar glide with minimal crepitus. Patellar and quadriceps tendons unremarkable. Hamstring and quadriceps strength is normal.  Contralateral knee has some mild arthritic changes but no tenderness and full range of motion.  Procedure note [id number]; 15 minutes spent for Therapeutic exercises as stated in above notes.  This included exercises focusing on stretching, strengthening, with significant focus on eccentric aspects.  Basic scapular stabilization to include adduction and depression of scapula Scaption, focusing on proper movement  and good control Rows with theraband   Proper technique shown and discussed handout in great detail with ATC.  All questions were discussed and answered.     Impression and Recommendations:     This case required medical decision making of moderate complexity.      Note: This dictation was prepared with Dragon dictation along with smaller phrase technology. Any transcriptional errors that result from this process are unintentional.

## 2016-03-29 ENCOUNTER — Encounter: Payer: Self-pay | Admitting: Family Medicine

## 2016-03-29 ENCOUNTER — Ambulatory Visit (INDEPENDENT_AMBULATORY_CARE_PROVIDER_SITE_OTHER)
Admission: RE | Admit: 2016-03-29 | Discharge: 2016-03-29 | Disposition: A | Discharge status: Home / Self Care | Payer: 59 | Source: Ambulatory Visit | Attending: Family Medicine | Admitting: Family Medicine

## 2016-03-29 ENCOUNTER — Ambulatory Visit (INDEPENDENT_AMBULATORY_CARE_PROVIDER_SITE_OTHER): Payer: 59 | Admitting: Family Medicine

## 2016-03-29 VITALS — BP 138/82 | HR 76 | Ht 62.0 in | Wt 140.8 lb

## 2016-03-29 DIAGNOSIS — M1711 Unilateral primary osteoarthritis, right knee: Secondary | ICD-10-CM | POA: Diagnosis not present

## 2016-03-29 DIAGNOSIS — Z9884 Bariatric surgery status: Secondary | ICD-10-CM | POA: Diagnosis not present

## 2016-03-29 DIAGNOSIS — M47812 Spondylosis without myelopathy or radiculopathy, cervical region: Secondary | ICD-10-CM | POA: Diagnosis not present

## 2016-03-29 DIAGNOSIS — M5030 Other cervical disc degeneration, unspecified cervical region: Secondary | ICD-10-CM | POA: Diagnosis not present

## 2016-03-29 DIAGNOSIS — M542 Cervicalgia: Secondary | ICD-10-CM

## 2016-03-29 MED ORDER — VITAMIN D (ERGOCALCIFEROL) 1.25 MG (50000 UNIT) PO CAPS: 50000.0000 [IU] | ORAL_CAPSULE | ORAL | 0 refills | Status: DC

## 2016-03-29 MED ORDER — DICLOFENAC SODIUM 2 % TD SOLN: 2.0000 "application " | Freq: Two times a day (BID) | TRANSDERMAL | 3 refills | Status: DC

## 2016-03-29 MED ORDER — GABAPENTIN 100 MG PO CAPS: 200.0000 mg | ORAL_CAPSULE | Freq: Every day | ORAL | 3 refills | Status: DC

## 2016-03-29 MED FILL — VIT D2 1.25 MG (50,000 UNIT: 1.25 MG | 84 days supply | Qty: 12 | Fill #0

## 2016-03-29 MED FILL — GABAPENTIN 100 MG CAPSULE: 100 | 30 days supply | Qty: 60 | Fill #0

## 2016-03-29 NOTE — Patient Instructions (Addendum)
Good to see you.  Xray downstairs today  Ice 20 minutes 2 times daily. Usually after activity and before bed. Exercises 3 times a week.  pennsaid pinkie amount topically 2 times daily as needed. Can use on your knee Gabapaenitn 200mg  at night Once weekly vitamin D for the next 12 weeks.  Iron 65mg  daily with 500mg  of vitamin C.  If constipation then take vitamin C with your meals.  On wall with heels, butt shoulder and head touching for a goal of 5 minutes daily  Stay active See me again in 3-4 weeks Happy holidays!

## 2016-03-29 NOTE — Assessment & Plan Note (Signed)
I do believe the patient's symptoms and patient's exam today I'm concerned the patient does have degenerative disc disease of the cervical spine. I do not believe that there is any nerve root impingement. Patient's pain is worsening at night we'll get gabapentin for breakthrough pain. Patient history of gastric bypass there is a concern for decreased absorption of vitamin D. Asian prescribed 5 and did help with muscle strength and endurance. We discussed postural exercises and ergonomics. Patient work with Product/process development scientist to learn home exercises in greater detail. We discussed the importance of icing and given topical anti-inflammatories. Patient's history of stomach bypass surgery we will avoid oral anti-inflammatories. Follow-up again in 3 weeks for further evaluation and treatment. Worsening symptoms may need to consider advanced imaging and/or formal physical therapy.

## 2016-03-29 NOTE — Assessment & Plan Note (Signed)
I believe mild in nature. We discussed icing, vitamin D, which activities to avoid. Worsening symptoms we'll consider x-rays, formal physical therapy and potential injections.

## 2016-04-04 ENCOUNTER — Other Ambulatory Visit: Payer: Self-pay | Admitting: Internal Medicine

## 2016-04-04 ENCOUNTER — Other Ambulatory Visit (INDEPENDENT_AMBULATORY_CARE_PROVIDER_SITE_OTHER): Payer: 59

## 2016-04-04 DIAGNOSIS — D62 Acute posthemorrhagic anemia: Secondary | ICD-10-CM | POA: Diagnosis not present

## 2016-04-04 DIAGNOSIS — K289 Gastrojejunal ulcer, unspecified as acute or chronic, without hemorrhage or perforation: Secondary | ICD-10-CM

## 2016-04-04 LAB — CBC WITH DIFFERENTIAL/PLATELET
Basophils Absolute: 0 10*3/uL (ref 0.0–0.1)
Basophils Relative: 0.5 % (ref 0.0–3.0)
Eosinophils Absolute: 0.2 10*3/uL (ref 0.0–0.7)
Eosinophils Relative: 2.8 % (ref 0.0–5.0)
HCT: 33.9 % — ABNORMAL LOW (ref 36.0–46.0)
Hemoglobin: 11.1 g/dL — ABNORMAL LOW (ref 12.0–15.0)
Lymphocytes Relative: 35.6 % (ref 12.0–46.0)
Lymphs Abs: 2.7 10*3/uL (ref 0.7–4.0)
MCHC: 32.7 g/dL (ref 30.0–36.0)
MCV: 90.4 fl (ref 78.0–100.0)
Monocytes Absolute: 0.3 10*3/uL (ref 0.1–1.0)
Monocytes Relative: 4.6 % (ref 3.0–12.0)
Neutro Abs: 4.2 10*3/uL (ref 1.4–7.7)
Neutrophils Relative %: 56.5 % (ref 43.0–77.0)
Platelets: 288 10*3/uL (ref 150.0–400.0)
RBC: 3.75 Mil/uL — ABNORMAL LOW (ref 3.87–5.11)
RDW: 13.5 % (ref 11.5–15.5)
WBC: 7.4 10*3/uL (ref 4.0–10.5)

## 2016-04-08 ENCOUNTER — Other Ambulatory Visit: Payer: Self-pay | Admitting: *Deleted

## 2016-04-08 NOTE — Patient Outreach (Addendum)
[facility]) Care Management  04/08/2016  Sandra [name] 1960-06-14 [id number]   Subjective: Received voicemail message from patient, states she is returning call and has received outreach letter.   States she is doing fine, was recently in the hospital due to bleeding ulcers, has no other health issues, has no needs except for assistance with 2 medications if available due to hospitalization if available, and requested call back.  States she is off today, verified call back number [phone]), she will be working the rest of the week, and it may be difficult to reach her for follow up.   Telephone call to patient's home/ mobile number, no answer, left HIPAA compliant voicemail message, and requested call back.  Objective: Per chart review: Patient hospitalized 03/10/16 - 03/12/16 for upper gastrointestinal bleed. Patient voluntary dis-enrolled from Link to Wellness diabetes program due to diabetes no longer an issue due to surgery on 08/17/15.    Assessment:Received UMR Transition of care follow up referral on 03/12/16. Transition of care follow up pending patient contact.   Plan: RNCM will proceed with case closure if no return call from patient, within 10 business days.   Necia Kamm H. Annia Friendly, BSN, [place] Management [facility] Telephonic CM Phone: [phone] Fax: [phone]

## 2016-04-10 ENCOUNTER — Other Ambulatory Visit: Payer: Self-pay | Admitting: *Deleted

## 2016-04-10 NOTE — Patient Outreach (Addendum)
[facility]) Care Management  04/10/2016  LIZZETE HOSSFELD 03-Jan-1961 [id number]   No response to patient outreach attempts, will proceed with case closure.   Objective: Per chart review: Patient hospitalized 03/10/16 - 03/12/16 for upper gastrointestinal bleed. Patient voluntary dis-enrolled from Link to Wellness diabetes program due to diabetes no longer an issue due to surgery on 08/17/15.    Assessment:Received UMR Transition of care follow up referral on 03/12/16. Transition of care follow up not completed, patient unable to contact, and will proceed with case closure.    Plan: RNCM will send case closure due to unable to contact request to [facility].     Pheonix Clinkscale H. Annia Friendly, BSN, [place] Management [facility] Telephonic CM Phone: [phone] Fax: [phone]

## 2016-04-11 ENCOUNTER — Encounter: Payer: Self-pay | Admitting: *Deleted

## 2016-04-11 NOTE — Progress Notes (Signed)
This encounter was created in error - please disregard.

## 2016-04-16 NOTE — Progress Notes (Signed)
Sandra [name] [facility] [place] Sandra [name], Sandra [name] [id number] Phone: [phone] Subjective:    I'm seeing this patient by the request  of:  BURNETT,BRENT A, MD   CC: Neck pain  [id number]  Sandra [name] is a 55 y.o. female coming in with complaint of neck pain. Past Medical history significant for CAD as well as gastric bypass surgery. Patient was found to have some mild spondylosis from C5-C7. X-rays were taken on December 1. These were independently visualized by me. Patient was to try gabapentin low dose, once weekly vitamin D. Patient was also given home exercises focusing on ergonomics. Patient states doing well overall. Still some mild discomfort. Significant improvement. Patient states that range of motion has helped out significantly.  Patient is also complaining of some mild right knee pain. Much better doing exercise and topical medicine, no pain  stated doing well overall all daily activities is fine.    Past Medical History:  Diagnosis Date  . Anxiety   . Arthritis   . CAD (coronary artery disease) NON-OBSTRUCTIVE PER DR HOCHREIN NOTE 08-26-2011  . Cancer [facility]) 2006   uterine  . Complication of anesthesia PT STATES HAS LETTER FROM [place] [facility]. STATING THAT SHE HAS A VERY SMALL AIRWAY (LAST SURG. HYSTERECTOMY AT [facility] IN 2006   PT STATES Sandra BEASON CRNA WHOM WORKS HERE TOLD SHE WOULD BE OKAY TO BE DONE AT [facility]  . Diabetes mellitus    no meds taken now, checks cbg once or twice a week   . 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 GERD (gastroesophageal reflux disease)   . Heart murmur    with last visit, MD states "outgrown" murmur  . Hemorrhoids, internal, with bleeding and Grade 2 prolapse 03/05/2013   All positions seen an anoscopy RP columns banded 03/05/2013 LL and RA banded 03/18/2013     . History of endometrial cancer 2006 ---- S/P ABD. HYSTERECTOMY   STAGE I ENDOMETROID CARCINOMA  . Hx of adenomatous colonic polyps 2011   BENIGN  . Hyperlipidemia    no meds now  . Hypertension   . Morbid obesity, weight - 203, BMI - 38.4 05/12/2013  . OSA on CPAP    cpap setting of 16  . Sleep apnea    no CPAP since surgery   Past Surgical History:  Procedure Laterality Date  . ABDOMINAL HYSTERECTOMY  12-20-2004   TAH  . BILATERAL BREAST REDUCTION  11-16-1999  . BREATH TEK H PYLORI N/A 05/27/2013   Procedure: BREATH TEK H PYLORI;  Surgeon: Sandra Medal, MD;  [facility];  Service: General;  Laterality: N/A;  . [place]  . COLONOSCOPY    . COLONOSCOPY N/A 04/25/2014   Procedure: COLONOSCOPY;  Surgeon: Sandra Mayer, MD;  [facility];  Service: Endoscopy;  Laterality: N/A;  . CYSTOSCOPY  09/06/2011   Procedure: CYSTOSCOPY;  Surgeon: Sandra Rud, MD;  [facility];  Service: Urology;  Laterality: N/A;  LYNX SLING   . ESOPHAGOGASTRODUODENOSCOPY (EGD) WITH PROPOFOL N/A 09/27/2014   Procedure: ESOPHAGOGASTRODUODENOSCOPY (EGD) WITH PROPOFOL;  Surgeon: Sandra Mayer, MD;  [facility];  Service: Endoscopy;  Laterality: N/A;  . ESOPHAGOGASTRODUODENOSCOPY (EGD) WITH PROPOFOL N/A 03/11/2016   Procedure: ESOPHAGOGASTRODUODENOSCOPY (EGD) WITH PROPOFOL;  Surgeon: Sandra Bears, MD;  [facility];  Service: Endoscopy;  Laterality: N/A;  . GASTRIC ROUX-EN-Y N/A 10/04/2013   Procedure: LAPAROSCOPIC ROUX-EN-Y GASTRIC BYPASS WITH UPPER ENDOSCOPY;  Surgeon: Sandra Medal, MD;  [facility];  Service: General;  Laterality: N/A;  . HEMORRHOID BANDING  02/2013  . PUBOVAGINAL SLING  09/06/2011   Procedure: Sandra [name];  Surgeon: Sandra Rud, MD;  [facility];  Service: Urology;  Laterality: N/A;  . UVULOPALATOPHARYNGOPLASTY, TONSILLECTOMY AND SEPTOPLASTY  1994   Social History   Social History  . Marital status: Single    Spouse name: N/A  . Number of children: N/A  . Years of education: N/A   Social  History Main Topics  . Smoking status: Never Smoker  . Smokeless tobacco: Never Used  . Alcohol use No  . Drug use: No  . Sexual activity: Yes    Birth control/ protection: Surgical     Comment: HYST.. 1st intercourse- 13, partners- 6   Other Topics Concern  . 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 Adopted: Yes    Past medical history, social, surgical and family history all reviewed in electronic medical record.  No pertanent information unless stated regarding to the chief complaint.   Review of Systems: No headache, visual changes, nausea, vomiting, diarrhea, constipation, dizziness, abdominal pain, skin rash, fevers, chills, night sweats, weight loss, swollen lymph nodes, body aches, joint swelling, muscle aches, chest pain, shortness of breath, mood changes.    Objective  Blood pressure 128/70, pulse 68, height 5\' 2"  (1.575 m), weight 145 lb (65.8 kg), SpO2 98 %.   Systems examined below as of 04/17/16 General: NAD A&O x3 mood, affect normal  HEENT: Pupils equal, extraocular movements intact no nystagmus Respiratory: not short of breath at rest or with speaking Cardiovascular: No lower extremity edema, non tender Skin: Warm dry intact with no signs of infection or rash on extremities or on axial skeleton. Abdomen: Soft nontender, no masses Neuro: Cranial nerves  intact, neurovascularly intact in all extremities with 2+ DTRs and 2+ pulses. Lymph: No lymphadenopathy appreciated today  Gait normal with good balance and coordination.  MSK:  Non tender with full range of motion and good stability and symmetric strength and tone of shoulders, elbows, wrist, hip, and ankles bilaterally. Mild arthritic changes Neck: Inspection unremarkable. No palpable stepoffs. Negative Spurling's maneuver. Full neck  range of motion Grip strength and sensation normal in bilateral hands Strength good C4 to T1 distribution No sensory change to C4 to T1 Negative Hoffman sign bilaterally Reflexes normal  Knee: Right Mild arthritic changes of the right knee.  Mild discomfort over the right medial joint line ROM full in flexion and extension and lower leg rotation. Ligaments with solid consistent endpoints including ACL, PCL, LCL, MCL. Negative Mcmurray's, Apley's, and Thessalonian tests. Mild painful patellar compression. Patellar glide with minimal crepitus. Patellar and quadriceps tendons unremarkable. Hamstring and quadriceps strength is normal.  Contralateral knee has some mild arthritic changes but no tenderness and full range of motion. Improvement from previous exam  Osteopathic findings Cervical C2 flexed rotated and side bent right C6 flexed rotated and side bent left T3 extended rotated and side bent right inhaled third rib T9 extended rotated and side bent left L2 flexed rotated and side bent right Sacrum right on right        Impression and Recommendations:     This case required medical decision making of moderate complexity.  Note: This dictation was prepared with Dragon dictation along with smaller phrase technology. Any transcriptional errors that result from this process are unintentional.

## 2016-04-17 ENCOUNTER — Ambulatory Visit (INDEPENDENT_AMBULATORY_CARE_PROVIDER_SITE_OTHER): Payer: 59 | Admitting: Family Medicine

## 2016-04-17 ENCOUNTER — Encounter: Payer: Self-pay | Admitting: Family Medicine

## 2016-04-17 DIAGNOSIS — M5030 Other cervical disc degeneration, unspecified cervical region: Secondary | ICD-10-CM

## 2016-04-17 DIAGNOSIS — M999 Biomechanical lesion, unspecified: Secondary | ICD-10-CM | POA: Diagnosis not present

## 2016-04-17 NOTE — Assessment & Plan Note (Signed)
Stiff overall. Responded well to osteopathic manipulation today. Encourage core, ergonomics and posture. Patient continue with over-the-counter medication. Follow-up again in 4 weeks

## 2016-04-17 NOTE — Patient Instructions (Addendum)
Good to see you.  Neck xrays not that bad which is good.  Happy holidays! Happy New Year! You are doing great  Tried manipulation.  I think your body likes it See me again in 4-6 weeks!!!!

## 2016-04-17 NOTE — Assessment & Plan Note (Signed)
Decision today to treat with OMT was based on Physical Exam  After verbal consent patient was treated with HVLA, ME, FPR techniques in cervical, thoracic, lumbar areas  Patient tolerated the procedure well with improvement in symptoms  Patient given exercises, stretches and lifestyle modifications  See medications in patient instructions if given  Patient will follow up in 4-6 weeks 

## 2016-04-24 NOTE — Progress Notes (Signed)
[name] [place], [place] [address] Phone: [phone] Subjective:    I'm seeing this patient by the request  of:  BURNETT,BRENT A, MD   CC: Neck pain f/u  [id number]  Sandra [name] is a 55 y.o. female coming in with complaint of neck pain. Past Medical history significant for CAD as well as gastric bypass surgery. Patient was found to have some mild spondylosis from C5-C7. X-rays were taken on December 1. These were independently visualized by me.  Patient was making some progress at last follow-up and didn't attempt osteopathic manipulation. States that she was doing fairly well until 3 days ago. Started having worsening pain again. Radiation going down the arm. No weakness but states that she has not been able to use the arm. Protocol at work and is waking her up at night. Gabapentin has not been helpful.    Past Medical History:  Diagnosis Date  . Anxiety   . Arthritis   . CAD (coronary artery disease) NON-OBSTRUCTIVE PER DR HOCHREIN NOTE 08-26-2011  . Cancer [facility]) 2006   uterine  . Complication of anesthesia PT STATES HAS LETTER FROM [place] [facility]. STATING THAT SHE HAS A VERY SMALL AIRWAY (LAST SURG. HYSTERECTOMY AT [facility] IN 2006   PT STATES LYNN BEASON CRNA WHOM WORKS HERE TOLD SHE WOULD BE OKAY TO BE DONE AT [facility]  . Diabetes mellitus    no meds taken now, checks cbg once or twice a week   . 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 GERD (gastroesophageal reflux disease)   . Heart murmur    with last visit, MD states "outgrown" murmur  . Hemorrhoids, internal, with bleeding and Grade 2 prolapse 03/05/2013   All positions seen an anoscopy RP columns banded 03/05/2013 LL and RA banded 03/18/2013     . History of endometrial cancer 2006 ---- S/P ABD. HYSTERECTOMY   STAGE I ENDOMETROID CARCINOMA  . Hx of adenomatous colonic polyps 2011   BENIGN  . Hyperlipidemia    no meds now  . Hypertension   . Morbid obesity, weight  - 203, BMI - 38.4 05/12/2013  . OSA on CPAP    cpap setting of 16  . Sleep apnea    no CPAP since surgery   Past Surgical History:  Procedure Laterality Date  . ABDOMINAL HYSTERECTOMY  12-20-2004   TAH  . BILATERAL BREAST REDUCTION  11-16-1999  . BREATH TEK H PYLORI N/A 05/27/2013   Procedure: BREATH TEK H PYLORI;  Surgeon: Shann Medal, MD;  [facility];  Service: General;  Laterality: N/A;  . [place]  . COLONOSCOPY    . COLONOSCOPY N/A 04/25/2014   Procedure: COLONOSCOPY;  Surgeon: Gatha Mayer, MD;  [facility];  Service: Endoscopy;  Laterality: N/A;  . CYSTOSCOPY  09/06/2011   Procedure: CYSTOSCOPY;  Surgeon: Ailene Rud, MD;  Location: [facility];  Service: Urology;  Laterality: N/A;  LYNX SLING   . ESOPHAGOGASTRODUODENOSCOPY (EGD) WITH PROPOFOL N/A 09/27/2014   Procedure: ESOPHAGOGASTRODUODENOSCOPY (EGD) WITH PROPOFOL;  Surgeon: Gatha Mayer, MD;  [facility];  Service: Endoscopy;  Laterality: N/A;  . ESOPHAGOGASTRODUODENOSCOPY (EGD) WITH PROPOFOL N/A 03/11/2016   Procedure: ESOPHAGOGASTRODUODENOSCOPY (EGD) WITH PROPOFOL;  Surgeon: Jerene Bears, MD;  [facility];  Service: Endoscopy;  Laterality: N/A;  . GASTRIC ROUX-EN-Y N/A 10/04/2013   Procedure: LAPAROSCOPIC ROUX-EN-Y GASTRIC BYPASS WITH UPPER ENDOSCOPY;  Surgeon: Shann Medal,  MD;  [facility];  Service: General;  Laterality: N/A;  . HEMORRHOID BANDING  02/2013  . PUBOVAGINAL SLING  09/06/2011   Procedure: Gaynelle Arabian;  Surgeon: Ailene Rud, MD;  Location: [facility];  Service: Urology;  Laterality: N/A;  . UVULOPALATOPHARYNGOPLASTY, TONSILLECTOMY AND SEPTOPLASTY  1994   Social History   Social History  . Marital status: Single    Spouse name: N/A  . Number of children: N/A  . Years of education: N/A   Social History Main Topics  . Smoking status: Never Smoker  . Smokeless tobacco: Never Used    . Alcohol use No  . Drug use: No  . Sexual activity: Yes    Birth control/ protection: Surgical     Comment: HYST.. 1st intercourse- 13, partners- 6   Other Topics Concern  . 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 Adopted: Yes    Past medical history, social, surgical and family history all reviewed in electronic medical record.  No pertanent information unless stated regarding to the chief complaint.   Review of Systems: No headache, visual changes, nausea, vomiting, diarrhea, constipation, dizziness, abdominal pain, skin rash, fevers, chills, night sweats, weight loss, swollen lymph nodes,  chest pain, shortness of breath, mood changes.      Objective  Blood pressure 132/78, pulse 67, height 5\' 2"  (1.575 m), weight 143 lb (64.9 kg), SpO2 97 %.   Systems examined below as of 04/25/16 General: NAD A&O x3 mood, affect normal  HEENT: Pupils equal, extraocular movements intact no nystagmus Respiratory: not short of breath at rest or with speaking Cardiovascular: No lower extremity edema, non tender Skin: Warm dry intact with no signs of infection or rash on extremities or on axial skeleton. Abdomen: Soft nontender, no masses Neuro: Cranial nerves  intact, neurovascularly intact in all extremities with 2+ DTRs and 2+ pulses. Lymph: No lymphadenopathy appreciated today  Gait normal with good balance and coordination.  MSK: Non tender with full range of motion and good stability and symmetric strength and tone of shoulders, elbows, wrist,  knee hips and ankles bilaterally.  Changes of multiple joints  Neck: Inspection unremarkable. No palpable stepoffs. Positive Spurling's maneuver on right side. Limited range of motion lacking the last 5 of extension as well as the last 5 of side bending Grip strength  and sensation normal in bilateral hands Strength good C4 to T1 distribution No sensory change to C4 to T1 Negative Hoffman sign bilaterally Reflexes normal     Impression and Recommendations:     This case required medical decision making of moderate complexity.      Note: This dictation was prepared with Dragon dictation along with smaller phrase technology. Any transcriptional errors that result from this process are unintentional.

## 2016-04-25 ENCOUNTER — Encounter: Payer: Self-pay | Admitting: Family Medicine

## 2016-04-25 ENCOUNTER — Ambulatory Visit (INDEPENDENT_AMBULATORY_CARE_PROVIDER_SITE_OTHER): Payer: 59 | Admitting: Family Medicine

## 2016-04-25 DIAGNOSIS — M5010 Cervical disc disorder with radiculopathy, unspecified cervical region: Secondary | ICD-10-CM | POA: Diagnosis not present

## 2016-04-25 MED ORDER — GABAPENTIN 300 MG PO CAPS: ORAL_CAPSULE | ORAL | 3 refills | Status: DC

## 2016-04-25 MED ORDER — PREDNISONE 50 MG PO TABS: 50.0000 mg | ORAL_TABLET | Freq: Every day | ORAL | 0 refills | Status: DC

## 2016-04-25 MED ORDER — METHYLPREDNISOLONE ACETATE 80 MG/ML IJ SUSP
80.0000 mg | Freq: Once | INTRAMUSCULAR | Status: AC
  Administered 2016-04-25: 80 mg via INTRAMUSCULAR

## 2016-04-25 MED ORDER — KETOROLAC TROMETHAMINE 60 MG/2ML IM SOLN
60.0000 mg | Freq: Once | INTRAMUSCULAR | Status: AC
  Administered 2016-04-25: 60 mg via INTRAMUSCULAR

## 2016-04-25 MED ORDER — GABAPENTIN 100 MG PO CAPS: 100.0000 mg | ORAL_CAPSULE | Freq: Two times a day (BID) | ORAL | 3 refills | Status: DC

## 2016-04-25 MED FILL — GABAPENTIN 300 MG CAPSULE: 300 | 30 days supply | Qty: 30 | Fill #0

## 2016-04-25 MED FILL — predniSONE 50 MG TABS: 50 | 5 days supply | Qty: 5 | Fill #0

## 2016-04-25 MED FILL — GABAPENTIN 100 MG CAPSULE: 100 | 30 days supply | Qty: 60 | Fill #0

## 2016-04-25 NOTE — Patient Instructions (Signed)
Good to see you  Ice is your friend  2 injections today  Gabapentin 100mg  in AM, 100mg  in PM and 300mg  at night Prednisone starting tomorrow for 5 days.  Try to stay active when you can.  I need to see you again in 2 weeks and if not better we will need to consider MRI.  Happy New Year!

## 2016-04-25 NOTE — Assessment & Plan Note (Signed)
Worsening symptoms. Patient does have known arthritic changes they've. Mild. Patient though has signs and symptoms significant with cervical radiculopathy at this time. Because of this we held on any type of osteopathic manipulation. Given 2 injections today to help with pain. Prednisone Dosepak given. Warned of potential side effects. We discussed icing regimen. Patient will avoid certain activities. Increase gabapentin to 100 mg a.m. and p.m. in 300 mg at night. Follow-up again in 2 weeks.

## 2016-04-30 DIAGNOSIS — F331 Major depressive disorder, recurrent, moderate: Secondary | ICD-10-CM | POA: Diagnosis not present

## 2016-05-07 ENCOUNTER — Encounter (HOSPITAL_COMMUNITY): Payer: Self-pay

## 2016-05-08 ENCOUNTER — Other Ambulatory Visit: Payer: Self-pay | Admitting: Internal Medicine

## 2016-05-08 MED FILL — ESOMEPRAZOLE MAG DR 40 MG C: 40 | 30 days supply | Qty: 60 | Fill #1

## 2016-05-08 MED FILL — ONDANSETRON HCL 4 MG TABLET: 4 | 5 days supply | Qty: 30 | Fill #0

## 2016-05-08 NOTE — Telephone Encounter (Signed)
Ok to refill Sir, thanks.

## 2016-05-08 NOTE — Telephone Encounter (Signed)
Refill x 2 

## 2016-05-12 NOTE — Progress Notes (Deleted)
[name] [place], [place] [address] Phone: [phone] Subjective:    I'm seeing this patient by the request  of:  BURNETT,BRENT A, MD   CC: Neck pain f/u  [id number]  Sandra [name] is a 56 y.o. female coming in with complaint of neck pain. Past Medical history significant for CAD as well as gastric bypass surgery. Patient was found to have some mild spondylosis from C5-C7.Patient did have repeat x-rays showing the patient did have some mild progression of arthritis but very minimal. Patient was started on gabapentin and some mild range of motion exercises. Patient states    Past Medical History:  Diagnosis Date  . Anxiety   . Arthritis   . CAD (coronary artery disease) NON-OBSTRUCTIVE PER DR HOCHREIN NOTE 08-26-2011  . Cancer [facility]) 2006   uterine  . Complication of anesthesia PT STATES HAS LETTER FROM [place] [facility]. STATING THAT SHE HAS A VERY SMALL AIRWAY (LAST SURG. HYST[facility]CTOMY AT [facility] IN 2006   PT STATES LYNN BEASON CRNA WHOM WORKS HERE TOLD SHE WOULD BE OKAY TO BE DONE AT [facility]  . Diabetes mellitus    no meds taken now, checks cbg once or twice a week   . 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 GERD (gastroesophageal reflux disease)   . Heart murmur    with last visit, MD states "outgrown" murmur  . Hemorrhoids, internal, with bleeding and Grade 2 prolapse 03/05/2013   All positions seen an anoscopy RP columns banded 03/05/2013 LL and RA banded 03/18/2013     . History of endometrial cancer 2006 ---- S/P ABD. HYSTERECTOMY   STAGE I ENDOMETROID CARCINOMA  . Hx of adenomatous colonic polyps 2011   BENIGN  . Hyperlipidemia    no meds now  . Hypertension   . Morbid obesity, weight - 203, BMI - 38.4 05/12/2013  . OSA on CPAP    cpap setting of 16  . Sleep apnea    no CPAP since surgery   Past Surgical History:  Procedure Laterality Date  . ABDOMINAL HYSTERECTOMY  12-20-2004   TAH  . BILATERAL BREAST REDUCTION   11-16-1999  . BREATH TEK H PYLORI N/A 05/27/2013   Procedure: BREATH TEK H PYLORI;  Surgeon: Shann Medal, MD;  [facility];  Service: General;  Laterality: N/A;  . [place]  . COLONOSCOPY    . COLONOSCOPY N/A 04/25/2014   Procedure: COLONOSCOPY;  Surgeon: Gatha Mayer, MD;  [facility];  Service: Endoscopy;  Laterality: N/A;  . CYSTOSCOPY  09/06/2011   Procedure: CYSTOSCOPY;  Surgeon: Ailene Rud, MD;  Location: [facility];  Service: Urology;  Laterality: N/A;  LYNX SLING   . ESOPHAGOGASTRODUODENOSCOPY (EGD) WITH PROPOFOL N/A 09/27/2014   Procedure: ESOPHAGOGASTRODUODENOSCOPY (EGD) WITH PROPOFOL;  Surgeon: Gatha Mayer, MD;  [facility];  Service: Endoscopy;  Laterality: N/A;  . ESOPHAGOGASTRODUODENOSCOPY (EGD) WITH PROPOFOL N/A 03/11/2016   Procedure: ESOPHAGOGASTRODUODENOSCOPY (EGD) WITH PROPOFOL;  Surgeon: Jerene Bears, MD;  [facility];  Service: Endoscopy;  Laterality: N/A;  . GASTRIC ROUX-EN-Y N/A 10/04/2013   Procedure: LAPAROSCOPIC ROUX-EN-Y GASTRIC BYPASS WITH UPPER ENDOSCOPY;  Surgeon: Shann Medal, MD;  [facility];  Service: General;  Laterality: N/A;  . HEMORRHOID BANDING  02/2013  . PUBOVAGINAL SLING  09/06/2011   Procedure: Gaynelle Arabian;  Surgeon: Ailene Rud, MD;  Location: [facility];  Service: Urology;  Laterality:  N/A;  . UVULOPALATOPHARYNGOPLASTY, TONSILLECTOMY AND SEPTOPLASTY  1994   Social History   Social History  . Marital status: Single    Spouse name: N/A  . Number of children: N/A  . Years of education: N/A   Social History Main Topics  . Smoking status: Never Smoker  . Smokeless tobacco: Never Used  . Alcohol use No  . Drug use: No  . Sexual activity: Yes    Birth control/ protection: Surgical     Comment: HYST.. 1st intercourse- 13, partners- 6   Other Topics Concern  . 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 Adopted: Yes    Past medical history, social, surgical and family history all reviewed in electronic medical record.  No pertanent information unless stated regarding to the chief complaint.   Review of Systems: No headache, visual changes, nausea, vomiting, diarrhea, constipation, dizziness, abdominal pain, skin rash, fevers, chills, night sweats, weight loss, swollen lymph nodes,  chest pain, shortness of breath, mood changes.      Objective  There were no vitals taken for this visit.   Systems examined below as of 05/12/16 General: NAD A&O x3 mood, affect normal  HEENT: Pupils equal, extraocular movements intact no nystagmus Respiratory: not short of breath at rest or with speaking Cardiovascular: No lower extremity edema, non tender Skin: Warm dry intact with no signs of infection or rash on extremities or on axial skeleton. Abdomen: Soft nontender, no masses Neuro: Cranial nerves  intact, neurovascularly intact in all extremities with 2+ DTRs and 2+ pulses. Lymph: No lymphadenopathy appreciated today  Gait normal with good balance and coordination.  MSK: Non tender with full range of motion and good stability and symmetric strength and tone of shoulders, elbows, wrist,  knee hips and ankles bilaterally.  Changes of multiple joints  Neck: Inspection unremarkable. No palpable stepoffs. Positive Spurling's maneuver on right side. Limited range of motion lacking the last 5 of extension as well as the last 5 of side bending Grip strength and sensation normal in bilateral hands Strength good C4 to T1 distribution No sensory change to C4 to T1 Negative Hoffman sign bilaterally Reflexes normal     Impression and Recommendations:     This case required medical decision making of moderate complexity.       Note: This dictation was prepared with Dragon dictation along with smaller phrase technology. Any transcriptional errors that result from this process are unintentional.

## 2016-05-13 ENCOUNTER — Ambulatory Visit: Payer: 59 | Admitting: Family Medicine

## 2016-05-23 MED FILL — SUCRALFATE 1 GM TAB: 1 | 30 days supply | Qty: 120 | Fill #1

## 2016-05-23 MED FILL — GABAPENTIN 300 MG CAPSULE: 300 | 30 days supply | Qty: 30 | Fill #1

## 2016-06-12 NOTE — Progress Notes (Signed)
[name] [place], [place] [address] Phone: [phone] Subjective:    I'm seeing this patient by the request  of:  BURNETT,BRENT A, MD   CC: Neck pain f/u  [id number]  Sandra [name] is a 56 y.o. female coming in with complaint of neck pain. Past Medical history significant for CAD as well as gastric bypass surgery. Patient was found to have some mild spondylosis from C5-C7.Patient did have repeat x-rays showing the patient did have some mild progression of arthritis but very minimal. Patient was started on gabapentin and some mild range of motion exercises. Patient states Minimal improvement at this time. Continues to have significant amount of pain. Patient states it seems to be radiating down the arm a more regular basis. Patient states it is affecting daily activity and which including her job. States that even sometimes she can get headache from the discomfort.    Past Medical History:  Diagnosis Date  . Anxiety   . Arthritis   . CAD (coronary artery disease) NON-OBSTRUCTIVE PER DR HOCHREIN NOTE 08-26-2011  . Cancer [facility]) 2006   uterine  . Complication of anesthesia PT STATES HAS LETTER FROM [place] [facility]. STATING THAT SHE HAS A VERY SMALL AIRWAY (LAST SURG. HYSTERECTOMY AT [facility] IN 2006   PT STATES LYNN BEASON CRNA WHOM WORKS HERE TOLD SHE WOULD BE OKAY TO BE DONE AT [facility]  . Diabetes mellitus    no meds taken now, checks cbg once or twice a week   . 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 GERD (gastroesophageal reflux disease)   . Heart murmur    with last visit, MD states "outgrown" murmur  . Hemorrhoids, internal, with bleeding and Grade 2 prolapse 03/05/2013   All positions seen an anoscopy RP columns banded 03/05/2013 LL and RA banded 03/18/2013     . History of endometrial cancer 2006 ---- S/P ABD. HYSTERECTOMY   STAGE I ENDOMETROID CARCINOMA  . Hx of adenomatous colonic polyps 2011   BENIGN  . Hyperlipidemia    no meds now  . Hypertension   . Morbid obesity, weight - 203, BMI - 38.4 05/12/2013  . OSA on CPAP    cpap setting of 16  . Sleep apnea    no CPAP since surgery   Past Surgical History:  Procedure Laterality Date  . ABDOMINAL HYSTERECTOMY  12-20-2004   TAH  . BILATERAL BREAST REDUCTION  11-16-1999  . BREATH TEK H PYLORI N/A 05/27/2013   Procedure: BREATH TEK H PYLORI;  Surgeon: Shann Medal, MD;  [facility];  Service: General;  Laterality: N/A;  . [place]  . COLONOSCOPY    . COLONOSCOPY N/A 04/25/2014   Procedure: COLONOSCOPY;  Surgeon: Gatha Mayer, MD;  [facility];  Service: Endoscopy;  Laterality: N/A;  . CYSTOSCOPY  09/06/2011   Procedure: CYSTOSCOPY;  Surgeon: Ailene Rud, MD;  Location: [facility];  Service: Urology;  Laterality: N/A;  LYNX SLING   . ESOPHAGOGASTRODUODENOSCOPY (EGD) WITH PROPOFOL N/A 09/27/2014   Procedure: ESOPHAGOGASTRODUODENOSCOPY (EGD) WITH PROPOFOL;  Surgeon: Gatha Mayer, MD;  [facility];  Service: Endoscopy;  Laterality: N/A;  . ESOPHAGOGASTRODUODENOSCOPY (EGD) WITH PROPOFOL N/A 03/11/2016   Procedure: ESOPHAGOGASTRODUODENOSCOPY (EGD) WITH PROPOFOL;  Surgeon: Jerene Bears, MD;  [facility];  Service: Endoscopy;  Laterality: N/A;  . GASTRIC ROUX-EN-Y N/A 10/04/2013   Procedure: LAPAROSCOPIC ROUX-EN-Y GASTRIC BYPASS WITH UPPER ENDOSCOPY;  Surgeon:  Shann Medal, MD;  [facility];  Service: General;  Laterality: N/A;  . HEMORRHOID BANDING  02/2013  . PUBOVAGINAL SLING  09/06/2011   Procedure: Gaynelle Arabian;  Surgeon: Ailene Rud, MD;  [facility];  Service: Urology;  Laterality: N/A;  . UVULOPALATOPHARYNGOPLASTY, TONSILLECTOMY AND SEPTOPLASTY  1994   Social History   Social History  . Marital status: Single    Spouse name: N/A  . Number of children: N/A  . Years of education: N/A   Social History Main Topics  . Smoking  status: Never Smoker  . Smokeless tobacco: Never Used  . Alcohol use No  . Drug use: No  . Sexual activity: Yes    Birth control/ protection: Surgical     Comment: HYST.. 1st intercourse- 13, partners- 6   Other Topics Concern  . 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 Adopted: Yes    Past medical history, social, surgical and family history all reviewed in electronic medical record.  No pertanent information unless stated regarding to the chief complaint.   Review of Systems: No visual changes, nausea, vomiting, diarrhea, constipation, dizziness, abdominal pain, skin rash, fevers, chills, night sweats, weight loss, swollen lymph nodes,  chest pain, shortness of breath, mood changes. Positive headaches, muscle aches and body aches.    .Blood pressure 130/72, pulse 70, height 5\' 2"  (1.575 m), weight 146 lb (66.2 kg), SpO2 98 %.  Systems examined below as of 06/13/16 General: NAD A&O x3 mood, affect normal  HEENT: Pupils equal, extraocular movements intact no nystagmus Respiratory: not short of breath at rest or with speaking Cardiovascular: No lower extremity edema, non tender Skin: Warm dry intact with no signs of infection or rash on extremities or on axial skeleton. Abdomen: Soft nontender, no masses Neuro: Cranial nerves  intact, neurovascularly intact in all extremities with 2+ DTRs and 2+ pulses. Lymph: No lymphadenopathy appreciated today  Gait normal with good balance and coordination.  MSK: Non tender with full range of motion and good stability and symmetric strength and tone of shoulders, elbows, wrist,  knee hips and ankles bilaterally.  Changes of multiple joints  Neck: Inspection unremarkable. No palpable stepoffs. Positive Spurling's with worsening symptoms with radicular symptoms  down the right side Lacks last 10 of side bending and rotation to the right lacks last 10 of extension as well as. Grip strength and sensation normal in bilateral hands Possible 4+ out of 5 strength in the C6 distribution compared to contralateral side No sensory change to C4 to T1 Negative Hoffman sign bilaterally Reflexes normal     Impression and Recommendations:     This case required medical decision making of moderate complexity.      Note: This dictation was prepared with Dragon dictation along with smaller phrase technology. Any transcriptional errors that result from this process are unintentional.

## 2016-06-13 ENCOUNTER — Encounter: Payer: Self-pay | Admitting: Family Medicine

## 2016-06-13 ENCOUNTER — Other Ambulatory Visit: Payer: Self-pay | Admitting: *Deleted

## 2016-06-13 ENCOUNTER — Ambulatory Visit (INDEPENDENT_AMBULATORY_CARE_PROVIDER_SITE_OTHER): Payer: 59 | Admitting: Family Medicine

## 2016-06-13 DIAGNOSIS — M5010 Cervical disc disorder with radiculopathy, unspecified cervical region: Secondary | ICD-10-CM | POA: Diagnosis not present

## 2016-06-13 MED ORDER — VENLAFAXINE HCL ER 37.5 MG PO CP24: 37.5000 mg | ORAL_CAPSULE | Freq: Every day | ORAL | 1 refills | Status: DC

## 2016-06-13 MED FILL — VENLAFAXINE HCL ER 37.5 MG: 37.5 | 30 days supply | Qty: 30 | Fill #0

## 2016-06-13 NOTE — Assessment & Plan Note (Signed)
Sandra [name] is having worsening symptoms with radicular symptoms at this time. I do not think that the manipulation would be very beneficial at this time. Patient is also having some mild weakness noted. I do feel that with the duration of pain, patient doing home exercises as well as physical therapy. Not made significant progress. I do believe that an MRI is necessary. We will have that ordered today depending on this could be a candidate for epidural injections. Continue the gabapentin and start a low dose of Effexor. We will see how patient response. Follow-up again with me after the MRI.

## 2016-06-13 NOTE — Patient Instructions (Signed)
God to see yo u Continue the gabapentin  Effexor 37.5mg  daily  We will get MRI of your neck and see what is going on.  If it is bad I will get epidural ordered as well.  I would like to see you again 2 weeks after epidural then and make sure you are doing better.

## 2016-06-25 ENCOUNTER — Other Ambulatory Visit: Payer: Self-pay | Admitting: Family Medicine

## 2016-06-25 MED FILL — VIT D2 1.25 MG (50,000 UNIT: 1.25 MG | 84 days supply | Qty: 12 | Fill #0

## 2016-07-02 ENCOUNTER — Ambulatory Visit
Admission: RE | Admit: 2016-07-02 | Discharge: 2016-07-02 | Disposition: A | Discharge status: Home / Self Care | Payer: 59 | Source: Ambulatory Visit | Attending: Family Medicine | Admitting: Family Medicine

## 2016-07-02 DIAGNOSIS — M5010 Cervical disc disorder with radiculopathy, unspecified cervical region: Secondary | ICD-10-CM

## 2016-07-02 DIAGNOSIS — M50222 Other cervical disc displacement at C5-C6 level: Secondary | ICD-10-CM | POA: Diagnosis not present

## 2016-07-02 DIAGNOSIS — M50221 Other cervical disc displacement at C4-C5 level: Secondary | ICD-10-CM | POA: Diagnosis not present

## 2016-07-08 MED FILL — VENLAFAXINE HCL ER 37.5 MG: 37.5 | 30 days supply | Qty: 30 | Fill #1

## 2016-07-08 MED FILL — GABAPENTIN 100 MG CAPSULE: 100 | 30 days supply | Qty: 60 | Fill #1

## 2016-07-08 MED FILL — GABAPENTIN 300 MG CAPSULE: 300 | 30 days supply | Qty: 30 | Fill #2

## 2016-07-17 ENCOUNTER — Other Ambulatory Visit: Payer: Self-pay

## 2016-07-17 ENCOUNTER — Encounter: Payer: Self-pay | Admitting: Family Medicine

## 2016-07-17 DIAGNOSIS — M5412 Radiculopathy, cervical region: Secondary | ICD-10-CM

## 2016-07-29 ENCOUNTER — Ambulatory Visit
Admission: RE | Admit: 2016-07-29 | Discharge: 2016-07-29 | Disposition: A | Discharge status: Home / Self Care | Payer: 59 | Source: Ambulatory Visit | Attending: Family Medicine | Admitting: Family Medicine

## 2016-07-29 VITALS — BP 141/80 | HR 56

## 2016-07-29 DIAGNOSIS — M5412 Radiculopathy, cervical region: Secondary | ICD-10-CM

## 2016-07-29 DIAGNOSIS — M542 Cervicalgia: Secondary | ICD-10-CM | POA: Diagnosis not present

## 2016-07-29 DIAGNOSIS — M5010 Cervical disc disorder with radiculopathy, unspecified cervical region: Secondary | ICD-10-CM

## 2016-07-29 MED ORDER — TRIAMCINOLONE ACETONIDE 40 MG/ML IJ SUSP (RADIOLOGY)
60.0000 mg | Freq: Once | INTRAMUSCULAR | Status: AC
  Administered 2016-07-29: 60 mg via EPIDURAL

## 2016-07-29 MED ORDER — IOPAMIDOL (ISOVUE-M 300) INJECTION 61%
1.0000 mL | Freq: Once | INTRAMUSCULAR | Status: AC | PRN
  Administered 2016-07-29: 1 mL via EPIDURAL

## 2016-07-29 NOTE — Discharge Instructions (Signed)

## 2016-07-30 ENCOUNTER — Encounter: Payer: Self-pay | Admitting: Family Medicine

## 2016-08-12 ENCOUNTER — Other Ambulatory Visit: Payer: Self-pay | Admitting: Internal Medicine

## 2016-08-12 ENCOUNTER — Other Ambulatory Visit (INDEPENDENT_AMBULATORY_CARE_PROVIDER_SITE_OTHER): Payer: 59

## 2016-08-12 DIAGNOSIS — D62 Acute posthemorrhagic anemia: Secondary | ICD-10-CM | POA: Diagnosis not present

## 2016-08-12 DIAGNOSIS — K912 Postsurgical malabsorption, not elsewhere classified: Secondary | ICD-10-CM | POA: Diagnosis not present

## 2016-08-12 LAB — CBC WITH DIFFERENTIAL/PLATELET
Basophils Absolute: 0.1 10*3/uL (ref 0.0–0.1)
Basophils Relative: 0.9 % (ref 0.0–3.0)
Eosinophils Absolute: 0.2 10*3/uL (ref 0.0–0.7)
Eosinophils Relative: 1.7 % (ref 0.0–5.0)
HCT: 41.4 % (ref 36.0–46.0)
Hemoglobin: 13.5 g/dL (ref 12.0–15.0)
Lymphocytes Relative: 34.8 % (ref 12.0–46.0)
Lymphs Abs: 3.9 10*3/uL (ref 0.7–4.0)
MCHC: 32.7 g/dL (ref 30.0–36.0)
MCV: 89.7 fl (ref 78.0–100.0)
Monocytes Absolute: 0.7 10*3/uL (ref 0.1–1.0)
Monocytes Relative: 6.5 % (ref 3.0–12.0)
Neutro Abs: 6.3 10*3/uL (ref 1.4–7.7)
Neutrophils Relative %: 56.1 % (ref 43.0–77.0)
Platelets: 273 10*3/uL (ref 150.0–400.0)
RBC: 4.61 Mil/uL (ref 3.87–5.11)
RDW: 16.6 % — ABNORMAL HIGH (ref 11.5–15.5)
WBC: 11.2 10*3/uL — ABNORMAL HIGH (ref 4.0–10.5)

## 2016-08-12 LAB — FERRITIN: Ferritin: 24.4 ng/mL (ref 10.0–291.0)

## 2016-08-12 LAB — VITAMIN B12: Vitamin B-12: >1500 pg/mL — ABNORMAL HIGH (ref 211–911)

## 2016-08-13 NOTE — Progress Notes (Signed)
My Chart note Labs ok ? Add some iron supp

## 2016-08-14 ENCOUNTER — Other Ambulatory Visit: Payer: Self-pay | Admitting: Family Medicine

## 2016-08-14 MED FILL — GABAPENTIN 100 MG CAPSULE: 100 | 30 days supply | Qty: 60 | Fill #2

## 2016-08-14 MED FILL — GABAPENTIN 300 MG CAPSULE: 300 | 30 days supply | Qty: 30 | Fill #3

## 2016-08-14 MED FILL — VENLAFAXINE HCL ER 37.5 MG: 37.5 | 30 days supply | Qty: 30 | Fill #0

## 2016-08-14 NOTE — Progress Notes (Signed)
Discussed w/ her - will stay on 2 MVI/day dor now

## 2016-08-26 DIAGNOSIS — Z9884 Bariatric surgery status: Secondary | ICD-10-CM | POA: Diagnosis not present

## 2016-09-04 DIAGNOSIS — Z09 Encounter for follow-up examination after completed treatment for conditions other than malignant neoplasm: Secondary | ICD-10-CM | POA: Diagnosis not present

## 2016-09-06 ENCOUNTER — Encounter (HOSPITAL_BASED_OUTPATIENT_CLINIC_OR_DEPARTMENT_OTHER): Payer: Self-pay

## 2016-09-06 ENCOUNTER — Emergency Department (HOSPITAL_BASED_OUTPATIENT_CLINIC_OR_DEPARTMENT_OTHER): Payer: 59

## 2016-09-06 ENCOUNTER — Emergency Department (HOSPITAL_BASED_OUTPATIENT_CLINIC_OR_DEPARTMENT_OTHER)
Admission: EM | Admit: 2016-09-06 | Discharge: 2016-09-06 | Disposition: A | Discharge status: Home / Self Care | Payer: 59 | Attending: Emergency Medicine | Admitting: Emergency Medicine

## 2016-09-06 DIAGNOSIS — S42031A Displaced fracture of lateral end of right clavicle, initial encounter for closed fracture: Secondary | ICD-10-CM | POA: Insufficient documentation

## 2016-09-06 DIAGNOSIS — Y998 Other external cause status: Secondary | ICD-10-CM | POA: Diagnosis not present

## 2016-09-06 DIAGNOSIS — Z8542 Personal history of malignant neoplasm of other parts of uterus: Secondary | ICD-10-CM | POA: Diagnosis not present

## 2016-09-06 DIAGNOSIS — W1839XA Other fall on same level, initial encounter: Secondary | ICD-10-CM | POA: Insufficient documentation

## 2016-09-06 DIAGNOSIS — I10 Essential (primary) hypertension: Secondary | ICD-10-CM | POA: Insufficient documentation

## 2016-09-06 DIAGNOSIS — Y929 Unspecified place or not applicable: Secondary | ICD-10-CM | POA: Diagnosis not present

## 2016-09-06 DIAGNOSIS — S4991XA Unspecified injury of right shoulder and upper arm, initial encounter: Secondary | ICD-10-CM | POA: Diagnosis present

## 2016-09-06 DIAGNOSIS — E119 Type 2 diabetes mellitus without complications: Secondary | ICD-10-CM | POA: Insufficient documentation

## 2016-09-06 DIAGNOSIS — I2510 Atherosclerotic heart disease of native coronary artery without angina pectoris: Secondary | ICD-10-CM | POA: Diagnosis not present

## 2016-09-06 DIAGNOSIS — Z79899 Other long term (current) drug therapy: Secondary | ICD-10-CM | POA: Insufficient documentation

## 2016-09-06 DIAGNOSIS — Y9367 Activity, basketball: Secondary | ICD-10-CM | POA: Diagnosis not present

## 2016-09-06 DIAGNOSIS — S42121A Displaced fracture of acromial process, right shoulder, initial encounter for closed fracture: Secondary | ICD-10-CM | POA: Diagnosis not present

## 2016-09-06 DIAGNOSIS — W19XXXA Unspecified fall, initial encounter: Secondary | ICD-10-CM

## 2016-09-06 MED ORDER — ONDANSETRON HCL 4 MG/2ML IJ SOLN
4.0000 mg | Freq: Once | INTRAMUSCULAR | Status: AC
  Administered 2016-09-06: 4 mg via INTRAVENOUS
  Filled 2016-09-06: qty 2

## 2016-09-06 MED ORDER — HYDROCODONE-ACETAMINOPHEN 7.5-325 MG/15ML PO SOLN: 15.0000 mL | Freq: Four times a day (QID) | ORAL | 0 refills | Status: DC | PRN

## 2016-09-06 MED ORDER — FENTANYL CITRATE (PF) 100 MCG/2ML IJ SOLN
50.0000 ug | Freq: Once | INTRAMUSCULAR | Status: AC
  Administered 2016-09-06: 50 ug via INTRAVENOUS
  Filled 2016-09-06: qty 2

## 2016-09-06 MED ORDER — ACETAMINOPHEN 500 MG PO TABS: 1000.0000 mg | ORAL_TABLET | Freq: Three times a day (TID) | ORAL | 0 refills | Status: AC

## 2016-09-06 MED ORDER — ONDANSETRON 4 MG PO TBDP: 4.0000 mg | ORAL_TABLET | Freq: Once | ORAL | Status: DC

## 2016-09-06 MED ORDER — CYCLOBENZAPRINE HCL 10 MG PO TABS: 10.0000 mg | ORAL_TABLET | Freq: Every day | ORAL | 0 refills | Status: AC

## 2016-09-06 MED ORDER — HYDROCODONE-ACETAMINOPHEN 7.5-325 MG/15ML PO SOLN
10.0000 mL | Freq: Once | ORAL | Status: AC
  Administered 2016-09-06: 10 mL via ORAL
  Filled 2016-09-06: qty 15

## 2016-09-06 NOTE — ED Triage Notes (Signed)
Daughter states pt was running/fell onto right shoulder approx 730pm-pt was assisted from car to w/c after sling applied to right UE

## 2016-09-06 NOTE — ED Provider Notes (Signed)
[facility] Provider Note   CSN: [id number] Arrival date & time: 09/06/16  2002   By signing my name below, I, Theresia Bough, attest that this documentation has been prepared under the direction and in the presence of Cardama, Grayce Sessions, MD. Electronically Signed: Theresia Bough, ED Scribe. 09/06/16. 9:19 PM.   History   Chief Complaint Chief Complaint  Patient presents with  . Shoulder Injury   The history is provided by the patient. No language interpreter was used.   HPI Comments: Sandra [name] is a 56 y.o. female who presents to the Emergency Department complaining of sudden onset, constant right shoulder pain onset 2 hours ago. She reports she was playing basketball with her grandchildren when she fell forwards and landed onto her right shoulder. No LOC or head injury. Pt notes associated nausea secondary to her pain. Her pain is exacerbated by touch and movement of the joint. No treatments were tried prior to arrival in the ED. She denies vomiting or any other associated symptoms at this time.   Past Medical History:  Diagnosis Date  . Anxiety   . Arthritis   . CAD (coronary artery disease) NON-OBSTRUCTIVE PER DR HOCHREIN NOTE 08-26-2011  . Cancer [facility]) 2006   uterine  . Complication of anesthesia PT STATES HAS LETTER FROM [place] [facility]. STATING THAT SHE HAS A VERY SMALL AIRWAY (LAST SURG. HYSTERECTOMY AT [facility] [place] 2006   PT STATES LYNN BEASON CRNA WHOM WORKS HERE TOLD SHE WOULD BE OKAY TO BE DONE AT [facility]  . Diabetes mellitus    no meds taken now, checks cbg once or twice a week   . 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 GERD (gastroesophageal reflux disease)   . Heart murmur    with last visit, MD states "outgrown" murmur  . Hemorrhoids, internal, with bleeding and Grade 2 prolapse 03/05/2013   All positions seen an anoscopy RP columns banded 03/05/2013 LL and RA banded 03/18/2013     . History of endometrial cancer 2006 ---- S/P ABD.  HYSTERECTOMY   STAGE I ENDOMETROID CARCINOMA  . Hx of adenomatous colonic polyps 2011   BENIGN  . Hyperlipidemia    no meds now  . Hypertension   . Morbid obesity, weight - 203, BMI - 38.4 05/12/2013  . OSA on CPAP    cpap setting of 16  . Sleep apnea    no CPAP since surgery    Patient Active Problem List   Diagnosis Date Noted  . Cervical disc disorder with radiculopathy of cervical region 04/25/2016  . Nonallopathic lesion of cervical region 04/17/2016  . Nonallopathic lesion of thoracic region 04/17/2016  . Nonallopathic lesion of rib cage 04/17/2016  . Nonallopathic lesion of lumbosacral region 04/17/2016  . Degenerative cervical disc 03/29/2016  . Degenerative arthritis of right knee 03/29/2016  . Acute blood loss anemia   . UGIB (upper gastrointestinal bleed) 03/10/2016  . Upper GI bleed 03/10/2016  . 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 Abdominal pain, epigastric   . Adjustment disorder with depressed mood 07/14/2014  . Hx of colonic polyps   . 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 Chronic constipation 03/05/2013  . Coronary atherosclerosis of native coronary artery 08/27/2011  . History of uterine cancer 08/15/2011  . Hypercholesterolemia 08/15/2011  . History of colonic polyps 12/29/2009    Past Surgical History:  Procedure Laterality Date  . ABDOMINAL HYSTERECTOMY  12-20-2004   TAH  . BILATERAL BREAST REDUCTION  11-16-1999  .  BREATH TEK H PYLORI N/A 05/27/2013   Procedure: BREATH TEK H PYLORI;  Surgeon: Shann Medal, MD;  Location: [facility];  Service: General;  Laterality: N/A;  . [place]  . COLONOSCOPY    . COLONOSCOPY N/A 04/25/2014   Procedure: COLONOSCOPY;  Surgeon: Gatha Mayer, MD;  Location: [facility];  Service: Endoscopy;  Laterality: N/A;  . CYSTOSCOPY  09/06/2011   Procedure: CYSTOSCOPY;  Surgeon: Ailene Rud, MD;  Location: [facility];  Service: Urology;  Laterality: N/A;  LYNX  SLING   . ESOPHAGOGASTRODUODENOSCOPY (EGD) WITH PROPOFOL N/A 09/27/2014   Procedure: ESOPHAGOGASTRODUODENOSCOPY (EGD) WITH PROPOFOL;  Surgeon: Gatha Mayer, MD;  Location: [facility];  Service: Endoscopy;  Laterality: N/A;  . ESOPHAGOGASTRODUODENOSCOPY (EGD) WITH PROPOFOL N/A 03/11/2016   Procedure: ESOPHAGOGASTRODUODENOSCOPY (EGD) WITH PROPOFOL;  Surgeon: Jerene Bears, MD;  Location: [facility];  Service: Endoscopy;  Laterality: N/A;  . GASTRIC ROUX-EN-Y N/A 10/04/2013   Procedure: LAPAROSCOPIC ROUX-EN-Y GASTRIC BYPASS WITH UPPER ENDOSCOPY;  Surgeon: Shann Medal, MD;  Location: [facility];  Service: General;  Laterality: N/A;  . HEMORRHOID BANDING  02/2013  . PUBOVAGINAL SLING  09/06/2011   Procedure: Gaynelle Arabian;  Surgeon: Ailene Rud, MD;  Location: [facility];  Service: Urology;  Laterality: N/A;  . UVULOPALATOPHARYNGOPLASTY, TONSILLECTOMY AND SEPTOPLASTY  1994    OB History    Gravida Para Term Preterm AB Living   3 2     1 2    SAB TAB Ectopic Multiple Live Births   1              Home Medications    Prior to Admission medications   Medication Sig Start Date End Date Taking? Authorizing Provider  acetaminophen (TYLENOL) 500 MG tablet Take 2 tablets (1,000 mg total) by mouth every 8 (eight) hours. Do not take more than 4000 mg of acetaminophen (Tylenol) in a 24-hour period. Please note that other medicines that you may be prescribed may have Tylenol as well. 09/06/16 09/11/16  Cardama, Grayce Sessions, MD  Calcium Carb-Cholecalciferol (CALCIUM PLUS VITAMIN D3 PO) Take 1 tablet by mouth 3 (three) times daily.     [provider]  cyclobenzaprine (FLEXERIL) 10 MG tablet Take 1 tablet (10 mg total) by mouth at bedtime. 09/06/16 09/16/16  Fatima Blank, MD  Diclofenac Sodium (PENNSAID) 2 % SOLN Place 2 application onto the skin 2 (two) times daily. 03/29/16   Lyndal Pulley, DO  esomeprazole (NEXIUM) 40 MG capsule Take 1 capsule (40 mg  total) by mouth 2 (two) times daily before a meal. 03/12/16   Johnson, Clanford L, MD  gabapentin (NEURONTIN) 100 MG capsule Take 1 capsule (100 mg total) by mouth 2 (two) times daily. 04/25/16   Lyndal Pulley, DO  gabapentin (NEURONTIN) 300 MG capsule nightly 04/25/16   Lyndal Pulley, DO  HYDROcodone-acetaminophen (HYCET) 7.5-325 mg/15 ml solution Take 15 mLs by mouth 4 (four) times daily as needed for moderate pain. Please do not exceed 4000 mg of acetaminophen (Tylenol) a 24-hour period. Please note that he may be prescribed additional medicine that contains acetaminophen. 09/06/16 09/06/17  Fatima Blank, MD  Multiple Vitamins-Minerals (MULTIVITAMIN PO) Take 1 tablet by mouth 2 (two) times daily.     [provider]  ondansetron (ZOFRAN) 4 MG tablet TAKE 1 TABLET BY MOUTH EVERY 8 HOURS AS NEEDED FOR NAUSEA AND VOMITING (MAY TAKE 2 TABLETS IF 1 NOT EFFECTIVE) 05/08/16   Carlean Purl,  Ofilia Neas, MD  sucralfate (CARAFATE) 1 g tablet TAKE 1 TABLET BY MOUTH 4 TIMES DAILY (WITH MEALS AND AT BEDTIME) 03/13/16   Gatha Mayer, MD  venlafaxine XR (EFFEXOR-XR) 37.5 MG 24 hr capsule TAKE 1 CAPSULE BY MOUTH ONCE DAILY WITH BREAKFAST 08/14/16   Lyndal Pulley, DO  vitamin B-12 (CYANOCOBALAMIN) 1000 MCG tablet Take 1,000 mcg by mouth daily.    [provider]  Vitamin D, Ergocalciferol, (DRISDOL) 50000 units CAPS capsule TAKE 1 CAPSULE BY MOUTH EVERY 7 DAYS. 06/25/16   Lyndal Pulley, DO   Family History Family History  Problem Relation Age of Onset  . Adopted: Yes   Social History Social History  Substance Use Topics  . Smoking status: Never Smoker  . Smokeless tobacco: Never Used  . Alcohol use No    Allergies   Nsaids   Review of Systems Review of Systems All other systems reviewed and are negative for acute change except as noted in the HPI.   Physical Exam Updated Vital Signs BP 135/80 (BP Location: Left Arm)   Pulse 66   Temp 98.7 F (37.1 C) (Oral)   Resp  18   Ht 5\' 2"  (1.575 m)   Wt 143 lb (64.9 kg)   SpO2 99%   BMI 26.16 kg/m   Physical Exam  Constitutional: She is oriented to person, place, and time. She appears well-developed and well-nourished. No distress.  HENT:  Head: Normocephalic and atraumatic.  Right Ear: External ear normal.  Left Ear: External ear normal.  Nose: Nose normal.  Eyes: Conjunctivae and EOM are normal. No scleral icterus.  Neck: Normal range of motion and phonation normal. No spinous process tenderness present.  Cardiovascular: Normal rate and regular rhythm.   Pulmonary/Chest: Effort normal. No stridor. No respiratory distress.  Abdominal: She exhibits no distension.  Musculoskeletal: Normal range of motion. She exhibits no edema.       Right shoulder: She exhibits tenderness, bony tenderness and deformity.       Cervical back: She exhibits no bony tenderness.       Thoracic back: She exhibits no bony tenderness.       Lumbar back: She exhibits no bony tenderness.       Arms: NVI distaly  Neurological: She is alert and oriented to person, place, and time.  Skin: She is not diaphoretic.  Psychiatric: She has a normal mood and affect. Her behavior is normal.  Vitals reviewed.    ED Treatments / Results  DIAGNOSTIC STUDIES: Oxygen Saturation is 100% on RA, normal by my interpretation.   COORDINATION OF CARE: 9:18 PM-Discussed next steps with pt including a f/u with orthopedics and pain management while in the ED. Pt verbalized understanding and is agreeable with the plan.   Labs (all labs ordered are listed, but only abnormal results are displayed) Labs Reviewed - No data to display  EKG  EKG Interpretation None       Radiology Dg Shoulder Right  Result Date: 09/06/2016 CLINICAL DATA:  Fall onto right shoulder.  Initial encounter. EXAM: RIGHT SHOULDER - 2+ VIEW COMPARISON:  None. FINDINGS: Distal right clavicle fracture, within 1 cm of the acromioclavicular joint at its closest. [facility] joint  measures 6 mm in distance, without offset. The fracture is displaced by at least 50%. Located glenohumeral joint. IMPRESSION: Displaced distal clavicle fracture on the right. Electronically Signed   By: Monte Fantasia M.D.   On: 09/06/2016 20:57   Procedures Procedures  Medications Ordered in  ED Medications  HYDROcodone-acetaminophen (HYCET) 7.5-325 mg/15 ml solution 10 mL (10 mLs Oral Given 09/06/16 2221)  ondansetron (ZOFRAN) injection 4 mg (4 mg Intravenous Given 09/06/16 2139)  fentaNYL (SUBLIMAZE) injection 50 mcg (50 mcg Intravenous Given 09/06/16 2139)    Initial Impression / Assessment and Plan / ED Course  I have reviewed the triage vital signs and the nursing notes.  Pertinent labs & imaging results that were available during my care of the patient were reviewed by me and considered in my medical decision making (see chart for details).     Workup consistent with right distal clavicle fracture dislocation. No other injuries noted on exam that warrant further imaging. Patient provided with pain medicine and sling. We'll have patient follow-up with orthopedic surgery for further evaluation and management.  The patient is safe for discharge with strict return precautions.   Final Clinical Impressions(s) / ED Diagnoses   Final diagnoses:  Closed displaced fracture of acromial end of right clavicle, initial encounter  Fall, initial encounter   Disposition: Discharge  Condition: Good  I have discussed the results, Dx and Tx plan with the patient who expressed understanding and agree(s) with the plan. Discharge instructions discussed at great length. The patient was given strict return precautions who verbalized understanding of the instructions. No further questions at time of discharge.    New Prescriptions   ACETAMINOPHEN (TYLENOL) 500 MG TABLET    Take 2 tablets (1,000 mg total) by mouth every 8 (eight) hours. Do not take more than 4000 mg of acetaminophen (Tylenol) in a  24-hour period. Please note that other medicines that you may be prescribed may have Tylenol as well.   CYCLOBENZAPRINE (FLEXERIL) 10 MG TABLET    Take 1 tablet (10 mg total) by mouth at bedtime.   HYDROCODONE-ACETAMINOPHEN (HYCET) 7.5-325 MG/15 ML SOLUTION    Take 15 mLs by mouth 4 (four) times daily as needed for moderate pain. Please do not exceed 4000 mg of acetaminophen (Tylenol) a 24-hour period. Please note that he may be prescribed additional medicine that contains acetaminophen.    Follow Up: Stephens Shire, MD [address] [place] [address] [phone]   As needed  Mcarthur Rossetti, MD [place] [address] [phone]  In 1 week For close follow up to assess for right clavicle fracture   I personally performed the services described in this documentation, which was scribed in my presence. The recorded information has been reviewed and is accurate.        Fatima Blank, MD 09/06/16 2241

## 2016-09-06 NOTE — ED Notes (Signed)
Patient transported to X-ray 

## 2016-09-09 DIAGNOSIS — S42024A Nondisplaced fracture of shaft of right clavicle, initial encounter for closed fracture: Secondary | ICD-10-CM | POA: Diagnosis not present

## 2016-09-09 MED FILL — VENLAFAXINE HCL ER 37.5 MG: 37.5 | 30 days supply | Qty: 30 | Fill #1

## 2016-09-09 MED FILL — HYDROCOD-APAP 7.5-325/15ML: 7.5-325 | 2 days supply | Qty: 120 | Fill #0

## 2016-09-10 ENCOUNTER — Ambulatory Visit: Payer: 59 | Admitting: Family Medicine

## 2016-09-11 ENCOUNTER — Encounter: Payer: Self-pay | Admitting: Gynecology

## 2016-09-16 DIAGNOSIS — S42024D Nondisplaced fracture of shaft of right clavicle, subsequent encounter for fracture with routine healing: Secondary | ICD-10-CM | POA: Diagnosis not present

## 2016-09-26 ENCOUNTER — Other Ambulatory Visit: Payer: Self-pay | Admitting: Family Medicine

## 2016-09-27 DIAGNOSIS — S42024D Nondisplaced fracture of shaft of right clavicle, subsequent encounter for fracture with routine healing: Secondary | ICD-10-CM | POA: Diagnosis not present

## 2016-10-07 ENCOUNTER — Encounter: Payer: 59 | Admitting: Gynecology

## 2016-10-09 MED FILL — ONDANSETRON HCL 4 MG TABLET: 4 | 5 days supply | Qty: 30 | Fill #1

## 2016-10-09 MED FILL — VIT D2 1.25 MG (50,000 UNIT: 1.25 MG | 84 days supply | Qty: 12 | Fill #0

## 2016-10-14 ENCOUNTER — Other Ambulatory Visit: Payer: Self-pay | Admitting: *Deleted

## 2016-10-14 MED ORDER — VENLAFAXINE HCL ER 37.5 MG PO CP24: ORAL_CAPSULE | ORAL | 1 refills | Status: DC

## 2016-10-14 MED FILL — VENLAFAXINE HCL ER 37.5 MG: 37.5 | 90 days supply | Qty: 90 | Fill #0

## 2016-10-14 NOTE — Telephone Encounter (Signed)
Refill done.  

## 2016-10-21 DIAGNOSIS — S42024D Nondisplaced fracture of shaft of right clavicle, subsequent encounter for fracture with routine healing: Secondary | ICD-10-CM | POA: Diagnosis not present

## 2016-10-22 ENCOUNTER — Ambulatory Visit (INDEPENDENT_AMBULATORY_CARE_PROVIDER_SITE_OTHER): Payer: 59 | Admitting: Gynecology

## 2016-10-22 ENCOUNTER — Encounter: Payer: Self-pay | Admitting: Gynecology

## 2016-10-22 VITALS — BP 120/82 | Ht 62.0 in | Wt 146.0 lb

## 2016-10-22 DIAGNOSIS — Z780 Asymptomatic menopausal state: Secondary | ICD-10-CM

## 2016-10-22 DIAGNOSIS — Z8542 Personal history of malignant neoplasm of other parts of uterus: Secondary | ICD-10-CM | POA: Diagnosis not present

## 2016-10-22 DIAGNOSIS — Z01419 Encounter for gynecological examination (general) (routine) without abnormal findings: Secondary | ICD-10-CM | POA: Diagnosis not present

## 2016-10-22 LAB — CBC WITH DIFFERENTIAL/PLATELET
Basophils Absolute: 0 cells/uL (ref 0–200)
Basophils Relative: 0 %
Eosinophils Absolute: 146 cells/uL (ref 15–500)
Eosinophils Relative: 2 %
HCT: 43.5 % (ref 35.0–45.0)
Hemoglobin: 13.8 g/dL (ref 11.7–15.5)
Lymphocytes Relative: 38 %
Lymphs Abs: 2774 cells/uL (ref 850–3900)
MCH: 29.7 pg (ref 27.0–33.0)
MCHC: 31.7 g/dL — ABNORMAL LOW (ref 32.0–36.0)
MCV: 93.5 fL (ref 80.0–100.0)
MPV: 9.9 fL (ref 7.5–12.5)
Monocytes Absolute: 511 cells/uL (ref 200–950)
Monocytes Relative: 7 %
Neutro Abs: 3869 cells/uL (ref 1500–7800)
Neutrophils Relative %: 53 %
Platelets: 265 10*3/uL (ref 140–400)
RBC: 4.65 MIL/uL (ref 3.80–5.10)
RDW: 13.8 % (ref 11.0–15.0)
WBC: 7.3 10*3/uL (ref 3.8–10.8)

## 2016-10-22 LAB — TSH: TSH: 1.18 mIU/L

## 2016-10-22 NOTE — Progress Notes (Signed)
Sandra [name] 1960/12/21 [id number]   History:    56 y.o.  for annual gyn exam with no complaints today.  Review of her record indicated that in 2006 she had a total abdominal hysterectomy as a result of menorrhagia and a preoperative endometrial biopsy which had demonstrated complex hyperplasia without atypia. 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 No invasion was identified. The cervix was negative for carcinoma and she had leiomyomatous uteri as well. The case had been discussed with the GYN oncologist (Dr. Threasa Heads) at the [place]) and had recommended no further intervention at that time and she had been followed closely every 6 months and has been cancer free since then. She is getting annual CA 125 along with a pelvic ultrasound.patient with no prior history of any abnormal pap smears.  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 Alphonsa Overall. Patient had a colonoscopy in 2015 and benign polyps were removed and she is on a 5 year recall.  Patient recently had a traumatic right clavicular fracture currently on a sling.  Past medical history,surgical history, family history and social history were all reviewed and documented in the EPIC chart.  Gynecologic History No LMP recorded. Patient has had a hysterectomy. Contraception: status post hysterectomy Last Pap: 2017. Results were: normal Last mammogram:results were: normal 2017   Obstetric History OB History  Gravida Para Term Preterm AB Living  3 2     1 2   SAB TAB Ectopic Multiple Live Births  1            # Outcome Date GA Lbr Len/2nd Weight Sex Delivery Anes PTL Lv  3 SAB           2 Para           1 Para                ROS: A ROS was performed and pertinent positives and negatives are included in the history.  GENERAL: No  fevers or chills. HEENT: No change in vision, no earache, sore throat or sinus congestion. NECK: No pain or stiffness. CARDIOVASCULAR: No chest pain or pressure. No palpitations. PULMONARY: No shortness of breath, cough or wheeze. GASTROINTESTINAL: No abdominal pain, nausea, vomiting or diarrhea, melena or bright red blood per rectum. GENITOURINARY: No urinary frequency, urgency, hesitancy or dysuria. MUSCULOSKELETAL: No joint or muscle pain, no back pain, no recent trauma. DERMATOLOGIC: No rash, no itching, no lesions. ENDOCRINE: No polyuria, polydipsia, no heat or cold intolerance. No recent change in weight. HEMATOLOGICAL: No anemia or easy bruising or bleeding. NEUROLOGIC: No headache, seizures, numbness, tingling or weakness. PSYCHIATRIC: No depression, no loss of interest in normal activity or change in sleep pattern.     Exam: chaperone present  BP 120/82   Ht 5\' 2"  (1.575 m)   Wt 146 lb (66.2 kg)   BMI 26.70 kg/m   Body mass index is 26.7 kg/m.  General appearance : Well developed well nourished female. No acute distress HEENT: Eyes: no retinal hemorrhage or exudates,  Neck supple, trachea midline, no carotid bruits, no thyroidmegaly Lungs: Clear to auscultation, no rhonchi or wheezes, or rib retractions  Heart: Regular rate and rhythm, no murmurs or gallops Breast:Examined in sitting and supine position were symmetrical in appearance, no palpable masses or tenderness,  no skin retraction, no nipple inversion,  no nipple discharge, no skin discoloration, no axillary or supraclavicular lymphadenopathy Abdomen: no palpable masses or tenderness, no rebound or guarding Extremities: no edema or skin discoloration or tenderness  Pelvic:  Bartholin, Urethra, Skene Glands: Within normal limits             Vagina: No gross lesions or discharge  Cervix:  absent  Uterus   absent   Adnexa  Without masses or tenderness  Anus and perineum  normal   Rectovaginal  normal sphincter tone without  palpated masses or tenderness             Hemoccult Cards provided  Assessment/Plan:  56 y.o. female for annual exam who in 2006 had a total abdominal hysterectomy as a result of menorrhagia and a preoperative endometrial biopsy which had demonstrated complex hyperplasia without atypia. 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 No invasion was identified. The cervix was negative for carcinoma and she had leiomyomatous uteri as well. Patient will return back for an ultrasound next week. I explained to her that if her ultrasound is normal this year she will no longer [place] less abnormalities noted on pelvic exam which she becomes symptomatic. Also she will no longer need CA 125's. Pap smear was done today. The following fasting blood work was ordered today: Comprehensive metabolic panel, CBC, TSH, lipid profile, and urinalysis. Patient also to schedule a bone density study here in the office in the next few weeks.    Terrance Mass MD, 11:40 AM 10/22/2016

## 2016-10-23 ENCOUNTER — Other Ambulatory Visit: Payer: Self-pay | Admitting: Gynecology

## 2016-10-23 DIAGNOSIS — R7989 Other specified abnormal findings of blood chemistry: Secondary | ICD-10-CM

## 2016-10-23 DIAGNOSIS — R945 Abnormal results of liver function studies: Principal | ICD-10-CM

## 2016-10-23 DIAGNOSIS — R739 Hyperglycemia, unspecified: Secondary | ICD-10-CM

## 2016-10-23 LAB — URINALYSIS W MICROSCOPIC + REFLEX CULTURE
Bacteria, UA: NONE SEEN [HPF]
Bilirubin Urine: NEGATIVE
Casts: NONE SEEN [LPF]
Crystals: NONE SEEN [HPF]
Glucose, UA: NEGATIVE
Hgb urine dipstick: NEGATIVE
Ketones, ur: NEGATIVE
Nitrite: NEGATIVE
Protein, ur: NEGATIVE
RBC / HPF: NONE SEEN RBC/HPF (ref <=2)
Specific Gravity, Urine: 1.02 (ref 1.001–1.035)
Yeast: NONE SEEN [HPF]
pH: 6.5 (ref 5.0–8.0)

## 2016-10-23 LAB — COMPREHENSIVE METABOLIC PANEL
ALT: 44 U/L — ABNORMAL HIGH (ref 6–29)
AST: 22 U/L (ref 10–35)
Albumin: 4.5 g/dL (ref 3.6–5.1)
Alkaline Phosphatase: 63 U/L (ref 33–130)
BUN: 20 mg/dL (ref 7–25)
CO2: 23 mmol/L (ref 20–31)
Calcium: 9.6 mg/dL (ref 8.6–10.4)
Chloride: 102 mmol/L (ref 98–110)
Creat: 0.58 mg/dL (ref 0.50–1.05)
Glucose, Bld: 108 mg/dL — ABNORMAL HIGH (ref 65–99)
Potassium: 3.9 mmol/L (ref 3.5–5.3)
Sodium: 140 mmol/L (ref 135–146)
Total Bilirubin: 0.4 mg/dL (ref 0.2–1.2)
Total Protein: 7 g/dL (ref 6.1–8.1)

## 2016-10-23 LAB — LIPID PANEL
Cholesterol: 162 mg/dL (ref <200)
HDL: 48 mg/dL — ABNORMAL LOW (ref >50)
LDL Cholesterol: 74 mg/dL (ref <100)
Total CHOL/HDL Ratio: 3.4 Ratio (ref <5.0)
Triglycerides: 200 mg/dL — ABNORMAL HIGH (ref <150)
VLDL: 40 mg/dL — ABNORMAL HIGH (ref <30)

## 2016-10-24 LAB — PAP IG W/ RFLX HPV ASCU

## 2016-10-24 LAB — URINE CULTURE

## 2016-11-05 DIAGNOSIS — H524 Presbyopia: Secondary | ICD-10-CM | POA: Diagnosis not present

## 2016-11-20 ENCOUNTER — Other Ambulatory Visit: Payer: Self-pay

## 2016-11-20 ENCOUNTER — Other Ambulatory Visit: Payer: 59

## 2016-11-20 ENCOUNTER — Ambulatory Visit: Payer: 59 | Admitting: Gynecology

## 2016-11-20 DIAGNOSIS — S42024D Nondisplaced fracture of shaft of right clavicle, subsequent encounter for fracture with routine healing: Secondary | ICD-10-CM | POA: Diagnosis not present

## 2016-12-02 ENCOUNTER — Ambulatory Visit: Payer: 59 | Attending: Orthopedic Surgery | Admitting: Physical Therapy

## 2016-12-02 DIAGNOSIS — M25511 Pain in right shoulder: Secondary | ICD-10-CM | POA: Insufficient documentation

## 2016-12-02 DIAGNOSIS — M6281 Muscle weakness (generalized): Secondary | ICD-10-CM | POA: Insufficient documentation

## 2016-12-02 DIAGNOSIS — R293 Abnormal posture: Secondary | ICD-10-CM | POA: Insufficient documentation

## 2016-12-02 DIAGNOSIS — M25611 Stiffness of right shoulder, not elsewhere classified: Secondary | ICD-10-CM | POA: Diagnosis not present

## 2016-12-02 NOTE — Therapy (Signed)
[facility] [address]  [place], [place], [address] Phone: [phone]   Fax:  [phone]  Physical Therapy Evaluation  Patient Details  Name: Sandra [name] MRN: [id number] Date of Birth: March 05, 1961 Referring Provider: Dr. Mardelle Matte  Encounter Date: 12/02/2016      PT End of Session - 12/02/16 1050    Visit Number 1   Number of Visits 12   Date for PT Re-Evaluation 01/13/17   Authorization Type [facility]   PT Start Time 1000   PT Stop Time 1046   PT Time Calculation (min) 46 min   Activity Tolerance Patient tolerated treatment well   Behavior During Therapy [facility] for tasks assessed/performed      Past Medical History:  Diagnosis Date  . Anxiety   . Arthritis   . CAD (coronary artery disease) NON-OBSTRUCTIVE PER DR HOCHREIN NOTE 08-26-2011  . Cancer [facility]) 2006   uterine  . Complication of anesthesia PT STATES HAS LETTER FROM [place] [facility]. STATING THAT SHE HAS A VERY SMALL AIRWAY (LAST SURG. HYSTERECTOMY AT [facility] [place] 2006   PT STATES LYNN BEASON CRNA WHOM WORKS HERE TOLD SHE WOULD BE OKAY TO BE DONE AT [facility]  . Diabetes mellitus    no meds taken now, checks cbg once or twice a week   . 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 GERD (gastroesophageal reflux disease)   . Heart murmur    with last visit, MD states "outgrown" murmur  . Hemorrhoids, internal, with bleeding and Grade 2 prolapse 03/05/2013   All positions seen an anoscopy RP columns banded 03/05/2013 LL and RA banded 03/18/2013     . History of endometrial cancer 2006 ---- S/P ABD. HYSTERECTOMY   STAGE I ENDOMETROID CARCINOMA  . Hx of adenomatous colonic polyps 2011   BENIGN  . Hyperlipidemia    no meds now  . Hypertension   . Morbid obesity, weight - 203, BMI - 38.4 05/12/2013  . OSA on CPAP    cpap setting of 16  . Sleep apnea    no CPAP since surgery    Past Surgical History:  Procedure Laterality Date  . ABDOMINAL HYSTERECTOMY   12-20-2004   TAH  . BILATERAL BREAST REDUCTION  11-16-1999  . BREATH TEK H PYLORI N/A 05/27/2013   Procedure: BREATH TEK H PYLORI;  Surgeon: Shann Medal, MD;  Location: [facility];  Service: General;  Laterality: N/A;  . [place]  . COLONOSCOPY    . COLONOSCOPY N/A 04/25/2014   Procedure: COLONOSCOPY;  Surgeon: Gatha Mayer, MD;  Location: [facility];  Service: Endoscopy;  Laterality: N/A;  . CYSTOSCOPY  09/06/2011   Procedure: CYSTOSCOPY;  Surgeon: Ailene Rud, MD;  Location: [facility];  Service: Urology;  Laterality: N/A;  LYNX SLING   . ESOPHAGOGASTRODUODENOSCOPY (EGD) WITH PROPOFOL N/A 09/27/2014   Procedure: ESOPHAGOGASTRODUODENOSCOPY (EGD) WITH PROPOFOL;  Surgeon: Gatha Mayer, MD;  [facility];  Service: Endoscopy;  Laterality: N/A;  . ESOPHAGOGASTRODUODENOSCOPY (EGD) WITH PROPOFOL N/A 03/11/2016   Procedure: ESOPHAGOGASTRODUODENOSCOPY (EGD) WITH PROPOFOL;  Surgeon: Jerene Bears, MD;  [facility];  Service: Endoscopy;  Laterality: N/A;  . GASTRIC ROUX-EN-Y N/A 10/04/2013   Procedure: LAPAROSCOPIC ROUX-EN-Y GASTRIC BYPASS WITH UPPER ENDOSCOPY;  Surgeon: Shann Medal, MD;  Location: [facility];  Service: General;  Laterality: N/A;  . HEMORRHOID BANDING  02/2013  . PUBOVAGINAL SLING  09/06/2011   Procedure: PUBO-VAGINAL SLING;  Surgeon: Ailene Rud, MD;  Location: [facility];  Service: Urology;  Laterality: N/A;  . UVULOPALATOPHARYNGOPLASTY, TONSILLECTOMY AND SEPTOPLASTY  1994    There were no vitals filed for this visit.       Subjective Assessment - 12/02/16 1000    Subjective Patient reports she was playinh basketball - fell on R shoulder - broken clavicle 09/06/16. Non-surgical. Has been in sling since 11/20/16. Has been doing pendulums for ROM. Currently only doing dishes and very light housework. Using L hand primarily for hair washing, physical help to clasp bra. Currently out of work -  work requirements up to 350#. Reports MD has not given her current restrictions other that to start PT. Unable to sleep in bed.   Patient is accompained by: Family member   Pertinent History gastric bypass   Diagnostic tests Xray - normal healing   Patient Stated Goals improve pain and function   Currently in Pain? Yes   Pain Score 3    Pain Location Shoulder   Pain Orientation Right   Pain Descriptors / Indicators Aching;Burning;Constant   Pain Type Acute pain   Pain Onset More than a month ago   Pain Frequency Constant   Aggravating Factors  lifting, awkward movements, sleeping on R side   Pain Relieving Factors ice, sling            [facility] PT Assessment - 12/02/16 1009      Assessment   Medical Diagnosis R distal clavicle fracture   Referring Provider Dr. Mardelle Matte   Onset Date/Surgical Date 09/06/16   Next MD Visit 12/18/16   Prior Therapy no     Precautions   Precautions None     Restrictions   Weight Bearing Restrictions No     Balance Screen   Has the patient fallen in the past 6 months Yes   How many times? 1   Has the patient had a decrease in activity level because of a fear of falling?  No   Is the patient reluctant to leave their home because of a fear of falling?  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   Leisure grandchildren     Cognition   Overall Cognitive Status Within Functional Limits for tasks assessed     Observation/Other Assessments   Focus on Therapeutic Outcomes (FOTO)  Shoulder: 26 (74% limited, predicted 41% limited)     Sensation   Light Touch Appears Intact     Coordination   Gross Motor Movements are Fluid and Coordinated Yes     Posture/Postural Control   Posture/Postural Control Postural limitations   Postural Limitations Rounded  Shoulders;Forward head     ROM / Strength   AROM / PROM / Strength AROM;PROM;Strength     AROM   AROM Assessment Site Shoulder   Right/Left Shoulder Right;Left   Right Shoulder Flexion 55 Degrees   Right Shoulder ABduction 42 Degrees   Right Shoulder Internal Rotation --  FIR iliac crest   Right Shoulder External Rotation --  FER elbow flexion to cheek   Left Shoulder Flexion 153 Degrees   Left Shoulder ABduction 167 Degrees   Left Shoulder Internal Rotation --  FIR T8   Left Shoulder External Rotation --  FER T1     PROM  PROM Assessment Site Shoulder   Right/Left Shoulder Right   Right Shoulder Flexion 120 Degrees   Right Shoulder ABduction 62 Degrees   Right Shoulder Internal Rotation 61 Degrees   Right Shoulder External Rotation 29 Degrees     Strength   Overall Strength Comments L UE grossly 4+/5; R UE not tested due to limited range against gravity.     Palpation   Palpation comment tenderness over anterior R shoulder            Objective measurements completed on examination: See above findings.          [facility] Adult PT Treatment/Exercise - 12/02/16 1009      Exercises   Exercises Shoulder     Shoulder Exercises: Supine   External Rotation AAROM;Right;5 reps   Flexion AAROM;Right;5 reps   ABduction AAROM;Right;5 reps     Manual Therapy   Manual Therapy Passive ROM   Manual therapy comments patient hooklying with bolster   Passive ROM PROM R shoulder into all planes. Pain dictating range with ability ti oncrease range from initial assessment.                 PT Education - 12/02/16 1049    Education provided Yes   Education Details exam findings, POC, HEP   Person(s) Educated Patient   Methods Explanation;Demonstration;Handout   Comprehension Verbalized understanding;Returned demonstration;Need further instruction          PT Short Term Goals - 12/02/16 1320      PT SHORT TERM GOAL #1   Title patient to be independent with  initial HEP    Status New   Target Date 12/30/16     PT SHORT TERM GOAL #2   Title patient to improve R shoulder PROM equal to that of L shoulder without pain limiting    Status New   Target Date 12/30/16           PT Long Term Goals - 12/02/16 1321      PT LONG TERM GOAL #1   Title patient to be independent with advanced HEP   Status New   Target Date 01/13/17     PT LONG TERM GOAL #2   Title patient to improve R shoulder AROM equal to that of L shoulder without pain limiting   Status New   Target Date 01/13/17     PT LONG TERM GOAL #3   Title patient to improve gross R UE strength to >/= 4/5   Status New   Target Date 01/13/17     PT LONG TERM GOAL #4   Title patient to report ability to perform ADLs and household tasks without pain limiting function   Status New   Target Date 01/13/17     PT LONG TERM GOAL #5   Title patient to report ability to return to work at full capacity.    Status New   Target Date 01/13/17                Plan - 12/02/16 1317    Clinical Impression Statement Patient is a 56 y/o female presenting to [place] today for low complexity eval s/p R distal clavicle fracture on 09/06/16 after a fall. Patient today with limited AROM, PROM and strength at R shoulder complex with pain limiting all motions. Patient has been doing pendulum exercise as given by MD, with PT giving patient AAROM with cane for R shoulder ROM today with good tolerance. Patient to benefit from PT  to address functional use of R UE to allow for return to work and leisure activities.    Clinical Presentation Stable   Clinical Presentation due to: no co-morbiditites affecting POC   Clinical Decision Making Low   Rehab Potential Good   PT Frequency 2x / week   PT Duration 6 weeks   PT Treatment/Interventions ADLs/Self Care Home Management;Cryotherapy;Electrical Stimulation;Iontophoresis 4mg /ml Dexamethasone;Moist Heat;Ultrasound;Therapeutic exercise;Therapeutic  activities;Patient/family education;Manual techniques;Passive range of motion;Vasopneumatic Device;Taping;Dry needling   Consulted and Agree with Plan of Care Patient      Patient will benefit from skilled therapeutic intervention in order to improve the following deficits and impairments:  Decreased activity tolerance, Decreased range of motion, Decreased strength, Impaired UE functional use, Pain  Visit Diagnosis: Acute pain of right shoulder - Plan: PT plan of care cert/re-cert  Stiffness of right shoulder, not elsewhere classified - Plan: PT plan of care cert/re-cert  Abnormal posture - Plan: PT plan of care cert/re-cert  Muscle weakness (generalized) - Plan: PT plan of care cert/re-cert     Problem List Patient Active Problem List   Diagnosis Date Noted  . Cervical disc disorder with radiculopathy of cervical region 04/25/2016  . Nonallopathic lesion of cervical region 04/17/2016  . Nonallopathic lesion of thoracic region 04/17/2016  . Nonallopathic lesion of rib cage 04/17/2016  . Nonallopathic lesion of lumbosacral region 04/17/2016  . Degenerative cervical disc 03/29/2016  . Degenerative arthritis of right knee 03/29/2016  . Acute blood loss anemia   . UGIB (upper gastrointestinal bleed) 03/10/2016  . Upper GI bleed 03/10/2016  . 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 Abdominal pain, epigastric   . Adjustment disorder with depressed mood 07/14/2014  . Hx of colonic polyps   . 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 Chronic constipation 03/05/2013  . Coronary atherosclerosis of native coronary artery 08/27/2011  . History of uterine cancer 08/15/2011  . Hypercholesterolemia 08/15/2011  . History of colonic polyps 12/29/2009    Lanney Gins, PT, DPT 12/02/16 1:24 PM   [facility] [address]  [place], [place], [address] Phone: [phone]   Fax:  [phone]  Name: Sandra [name] MRN: [id number] Date of Birth: 1960/12/05

## 2016-12-02 NOTE — Patient Instructions (Signed)
Cane Exercise: Flexion   Lie on back, holding cane above chest. Keeping arms as straight as possible, lower cane toward floor beyond head. Hold __5-10__ seconds. Repeat __10-15__ times. Do __2__ sessions per day.  Cane Exercise: Abduction   Hold cane with right hand over end, palm-up, with other hand palm-down. Move arm out from side and up by pushing with other arm. Hold _5-10___ seconds. Repeat _15___ times. Do __2__ sessions per day.  External Rotation (Eccentric), Active-Assist - Supine (Cane)   Lie on back, affected arm out from side, elbow at 90, forearm forward. Use cane to assist in lifting forearm of affected arm to neutral. Slowly lower for 3-5 seconds. _15__ reps per set, _2__ sets per day.

## 2016-12-04 ENCOUNTER — Ambulatory Visit: Payer: 59

## 2016-12-05 ENCOUNTER — Ambulatory Visit: Payer: 59 | Admitting: Physical Therapy

## 2016-12-05 DIAGNOSIS — M6281 Muscle weakness (generalized): Secondary | ICD-10-CM | POA: Diagnosis not present

## 2016-12-05 DIAGNOSIS — R293 Abnormal posture: Secondary | ICD-10-CM | POA: Diagnosis not present

## 2016-12-05 DIAGNOSIS — M25611 Stiffness of right shoulder, not elsewhere classified: Secondary | ICD-10-CM

## 2016-12-05 DIAGNOSIS — M25511 Pain in right shoulder: Secondary | ICD-10-CM

## 2016-12-05 NOTE — Patient Instructions (Signed)
Strengthening: Isometric Flexion  Using wall for resistance, press right fist into ball using light pressure. Hold __5-10__ seconds. Repeat __10-15__ times per set.  Do __2__ sessions per day.  SHOULDER: Abduction (Isometric)  Use wall as resistance. Press arm against pillow. Keep elbow straight. Hold _5-10__ seconds. _10-15__ reps per set, _2__ sets per day.  Extension (Isometric)  Place left bent elbow and back of arm against wall. Press elbow against wall. Hold __5-10__ seconds. Repeat __10-15__ times. Do __2__ sessions per day.  Internal Rotation (Isometric)  Place palm of right fist against door frame, with elbow bent. Press fist against door frame. Hold __5-10__ seconds. Repeat _10-15___ times. Do __2__ sessions per day.  External Rotation (Isometric)  Place back of left fist against door frame, with elbow bent. Press fist against door frame. Hold _5-10___ seconds. Repeat _10-15___ times. Do __2__ sessions per day.  SHOULDER: Adduction (Isometric)   Use body as resistance. Press arm into pillow. Hold _5-10__ seconds. _10-15__ reps per set, _2__ sets per day.

## 2016-12-05 NOTE — Therapy (Signed)
[facility] [address]  [place], [place], [address] Phone: [phone]   Fax:  [phone]  Physical Therapy Treatment  Patient Details  Name: Sandra [name] MRN: [id number] Date of Birth: 1960/12/26 Referring Provider: Dr. Mardelle Matte  Encounter Date: 12/05/2016      PT End of Session - 12/05/16 1055    Visit Number 2   Number of Visits 12   Date for PT Re-Evaluation 01/13/17   Authorization Type [facility]   PT Start Time 1019   PT Stop Time 1059   PT Time Calculation (min) 40 min   Activity Tolerance Patient tolerated treatment well   Behavior During Therapy [facility] for tasks assessed/performed      Past Medical History:  Diagnosis Date  . Anxiety   . Arthritis   . CAD (coronary artery disease) NON-OBSTRUCTIVE PER DR HOCHREIN NOTE 08-26-2011  . Cancer [facility]) 2006   uterine  . Complication of anesthesia PT STATES HAS LETTER FROM [place] [facility]. STATING THAT SHE HAS A VERY SMALL AIRWAY (LAST SURG. HYSTERECTOMY AT [facility] IN 2006   PT STATES LYNN BEASON CRNA WHOM WORKS HERE TOLD SHE WOULD BE OKAY TO BE DONE AT [facility]  . Diabetes mellitus    no meds taken now, checks cbg once or twice a week   . 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 GERD (gastroesophageal reflux disease)   . Heart murmur    with last visit, MD states "outgrown" murmur  . Hemorrhoids, internal, with bleeding and Grade 2 prolapse 03/05/2013   All positions seen an anoscopy RP columns banded 03/05/2013 LL and RA banded 03/18/2013     . History of endometrial cancer 2006 ---- S/P ABD. HYSTERECTOMY   STAGE I ENDOMETROID CARCINOMA  . Hx of adenomatous colonic polyps 2011   BENIGN  . Hyperlipidemia    no meds now  . Hypertension   . Morbid obesity, weight - 203, BMI - 38.4 05/12/2013  . OSA on CPAP    cpap setting of 16  . Sleep apnea    no CPAP since surgery    Past Surgical History:  Procedure Laterality Date  . ABDOMINAL HYSTERECTOMY   12-20-2004   TAH  . BILATERAL BREAST REDUCTION  11-16-1999  . BREATH TEK H PYLORI N/A 05/27/2013   Procedure: BREATH TEK H PYLORI;  Surgeon: Shann Medal, MD;  Location[facility];  Service: General;  Laterality: N/A;  . [place]  . COLONOSCOPY    . COLONOSCOPY N/A 04/25/2014   Procedure: COLONOSCOPY;  Surgeon: Gatha Mayer, MD;  [facility];  Service: Endoscopy;  Laterality: N/A;  . CYSTOSCOPY  09/06/2011   Procedure: CYSTOSCOPY;  Surgeon: Ailene Rud, MD;  Location: [facility];  Service: Urology;  Laterality: N/A;  LYNX SLING   . ESOPHAGOGASTRODUODENOSCOPY (EGD) WITH PROPOFOL N/A 09/27/2014   Procedure: ESOPHAGOGASTRODUODENOSCOPY (EGD) WITH PROPOFOL;  Surgeon: Gatha Mayer, MD;  [facility];  Service: Endoscopy;  Laterality: N/A;  . ESOPHAGOGASTRODUODENOSCOPY (EGD) WITH PROPOFOL N/A 03/11/2016   Procedure: ESOPHAGOGASTRODUODENOSCOPY (EGD) WITH PROPOFOL;  Surgeon: Jerene Bears, MD;  [facility];  Service: Endoscopy;  Laterality: N/A;  . GASTRIC ROUX-EN-Y N/A 10/04/2013   Procedure: LAPAROSCOPIC ROUX-EN-Y GASTRIC BYPASS WITH UPPER ENDOSCOPY;  Surgeon: Shann Medal, MD;  [facility];  Service: General;  Laterality: N/A;  . HEMORRHOID BANDING  02/2013  . PUBOVAGINAL SLING  09/06/2011   Procedure: PUBO-VAGINAL SLING;  Surgeon: Ailene Rud, MD;  Location[facility];  Service: Urology;  Laterality: N/A;  . UVULOPALATOPHARYNGOPLASTY, TONSILLECTOMY AND SEPTOPLASTY  1994    There were no vitals filed for this visit.      Subjective Assessment - 12/05/16 1044    Subjective good compliance with HEP - question about ER AAROM   Patient is accompained by: Family member   Pertinent History gastric bypass   Diagnostic tests Xray - normal healing   Patient Stated Goals improve pain and function   Currently in Pain? Yes   Pain Score 4    Pain Location Shoulder   Pain Orientation Right    Pain Descriptors / Indicators Aching;Sore   Pain Type Acute pain                         [facility] Adult PT Treatment/Exercise - 12/05/16 1045      Shoulder Exercises: Supine   External Rotation AAROM;Right;5 reps   External Rotation Limitations VC/TC for form   Flexion AAROM;Right;5 reps   ABduction AAROM;Right;5 reps   ABduction Limitations VC for form     Shoulder Exercises: Pulleys   Flexion 3 minutes   ABduction 3 minutes     Shoulder Exercises: Isometric Strengthening   Flexion Limitations 10 x 5" hold   Extension Limitations 10 x 5"    External Rotation Limitations 10 x 5"    Internal Rotation Limitations 10 x 5"    ABduction Limitations 10 x 5"    ADduction Limitations 10 x 5"                   PT Short Term Goals - 12/05/16 1055      PT SHORT TERM GOAL #1   Title patient to be independent with initial HEP    Status On-going     PT SHORT TERM GOAL #2   Title patient to improve R shoulder PROM equal to that of L shoulder without pain limiting    Status On-going           PT Long Term Goals - 12/05/16 1055      PT LONG TERM GOAL #1   Title patient to be independent with advanced HEP   Status On-going     PT LONG TERM GOAL #2   Title patient to improve R shoulder AROM equal to that of L shoulder without pain limiting   Status On-going     PT LONG TERM GOAL #3   Title patient to improve gross R UE strength to >/= 4/5   Status On-going     PT LONG TERM GOAL #4   Title patient to report ability to perform ADLs and household tasks without pain limiting function   Status On-going     PT LONG TERM GOAL #5   Title patient to report ability to return to work at full capacity.    Status On-going               Plan - 12/05/16 1055    Clinical Impression Statement Patient doing well today - good compliance with HEP. Review of current HEP as well as addition of isometrics in all planes with good tolerance - only extension  reproducing slight pain. Great improvements in PROM from initial visit likely due to patient compliance with HEP for AAROM. Will continue to progress towards goals.    PT Treatment/Interventions ADLs/Self Care Home Management;Cryotherapy;Electrical Stimulation;Iontophoresis 4mg /ml Dexamethasone;Moist Heat;Ultrasound;Therapeutic exercise;Therapeutic  activities;Patient/family education;Manual techniques;Passive range of motion;Vasopneumatic Device;Taping;Dry needling   Consulted and Agree with Plan of Care Patient      Patient will benefit from skilled therapeutic intervention in order to improve the following deficits and impairments:  Decreased activity tolerance, Decreased range of motion, Decreased strength, Impaired UE functional use, Pain  Visit Diagnosis: Acute pain of right shoulder  Stiffness of right shoulder, not elsewhere classified  Abnormal posture  Muscle weakness (generalized)     Problem List Patient Active Problem List   Diagnosis Date Noted  . Cervical disc disorder with radiculopathy of cervical region 04/25/2016  . Nonallopathic lesion of cervical region 04/17/2016  . Nonallopathic lesion of thoracic region 04/17/2016  . Nonallopathic lesion of rib cage 04/17/2016  . Nonallopathic lesion of lumbosacral region 04/17/2016  . Degenerative cervical disc 03/29/2016  . Degenerative arthritis of right knee 03/29/2016  . Acute blood loss anemia   . UGIB (upper gastrointestinal bleed) 03/10/2016  . Upper GI bleed 03/10/2016  . 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 Abdominal pain, epigastric   . Adjustment disorder with depressed mood 07/14/2014  . Hx of colonic polyps   . 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 Chronic constipation 03/05/2013  . Coronary atherosclerosis of native coronary artery 08/27/2011  . History of uterine cancer 08/15/2011  . Hypercholesterolemia 08/15/2011  . History of colonic polyps 12/29/2009    Lanney Gins, PT,  DPT 12/05/16 11:00 AM   [facility] [address]  [place], [place], [address] Phone: [phone]   Fax:  [phone]  Name: TIMBERLY YOTT MRN: [id number] Date of Birth: [birth year]

## 2016-12-09 ENCOUNTER — Ambulatory Visit: Payer: 59 | Admitting: Physical Therapy

## 2016-12-09 DIAGNOSIS — M6281 Muscle weakness (generalized): Secondary | ICD-10-CM

## 2016-12-09 DIAGNOSIS — M25511 Pain in right shoulder: Secondary | ICD-10-CM

## 2016-12-09 DIAGNOSIS — R293 Abnormal posture: Secondary | ICD-10-CM

## 2016-12-09 DIAGNOSIS — M25611 Stiffness of right shoulder, not elsewhere classified: Secondary | ICD-10-CM

## 2016-12-09 NOTE — Therapy (Signed)
[facility] [address]  [place], [place], [address] Phone: [phone]   Fax:  [phone]  Physical Therapy Treatment  Patient Details  Name: Sandra [name] MRN: [id number] Date of Birth: 1961/02/10 Referring Provider: Dr. Mardelle Matte  Encounter Date: 12/09/2016      PT End of Session - 12/09/16 1015    Visit Number 3   Number of Visits 12   Date for PT Re-Evaluation 01/13/17   Authorization Type [facility]   PT Start Time 1013   PT Stop Time 1054   PT Time Calculation (min) 41 min   Activity Tolerance Patient tolerated treatment well   Behavior During Therapy [facility] for tasks assessed/performed      Past Medical History:  Diagnosis Date  . Anxiety   . Arthritis   . CAD (coronary artery disease) NON-OBSTRUCTIVE PER DR HOCHREIN NOTE 08-26-2011  . Cancer [facility]) 2006   uterine  . Complication of anesthesia PT STATES HAS LETTER FROM [place] [facility]. STATING THAT SHE HAS A VERY SMALL AIRWAY (LAST SURG. [facility]ST[facility]CTOMY AT [facility] IN 2006   PT STATES LYNN BEASON CRNA WHOM WORKS HERE TOLD SHE WOULD BE OKAY TO BE DONE AT [facility]  . Diabetes mellitus    no meds taken now, checks cbg once or twice a week   . 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 GERD (gastroesophageal reflux disease)   . Heart murmur    with last visit, MD states "outgrown" murmur  . Hemorrhoids, internal, with bleeding and Grade 2 prolapse 03/05/2013   All positions seen an anoscopy RP columns banded 03/05/2013 LL and RA banded 03/18/2013     . History of endometrial cancer 2006 ---- S/P ABD. HYSTERECTOMY   STAGE I ENDOMETROID CARCINOMA  . Hx of adenomatous colonic polyps 2011   BENIGN  . Hyperlipidemia    no meds now  . Hypertension   . Morbid obesity, weight - 203, BMI - 38.4 05/12/2013  . OSA on CPAP    cpap setting of 16  . Sleep apnea    no CPAP since surgery    Past Surgical History:  Procedure Laterality Date  . ABDOMINAL HYSTERECTOMY   12-20-2004   TAH  . BILATERAL BREAST REDUCTION  11-16-1999  . BREATH TEK H PYLORI N/A 05/27/2013   Procedure: BREATH TEK H PYLORI;  Surgeon: Shann Medal, MD;  Location[facility];  Service: General;  Laterality: N/A;  . [place]  . COLONOSCOPY    . COLONOSCOPY N/A 04/25/2014   Procedure: COLONOSCOPY;  Surgeon: Gatha Mayer, MD;  Location: [facility];  Service: Endoscopy;  Laterality: N/A;  . CYSTOSCOPY  09/06/2011   Procedure: CYSTOSCOPY;  Surgeon: Ailene Rud, MD;  Location: [facility];  Service: Urology;  Laterality: N/A;  LYNX SLING   . ESOPHAGOGASTRODUODENOSCOPY (EGD) WITH PROPOFOL N/A 09/27/2014   Procedure: ESOPHAGOGASTRODUODENOSCOPY (EGD) WITH PROPOFOL;  Surgeon: Gatha Mayer, MD;  [facility]: [facility];  Service: Endoscopy;  Laterality: N/A;  . ESOPHAGOGASTRODUODENOSCOPY (EGD) WITH PROPOFOL N/A 03/11/2016   Procedure: ESOPHAGOGASTRODUODENOSCOPY (EGD) WITH PROPOFOL;  Surgeon: Jerene Bears, MD;  Location: [facility];  Service: Endoscopy;  Laterality: N/A;  . GASTRIC ROUX-EN-Y N/A 10/04/2013   Procedure: LAPAROSCOPIC ROUX-EN-Y GASTRIC BYPASS WITH UPPER ENDOSCOPY;  Surgeon: Shann Medal, MD;  Location: [facility];  Service: General;  Laterality: N/A;  . HEMORRHOID BANDING  02/2013  . PUBOVAGINAL SLING  09/06/2011   Procedure: PUBO-VAGINAL SLING;  Surgeon: Ailene Rud, MD;  Location: [facility];  Service: Urology;  Laterality: N/A;  . UVULOPALATOPHARYNGOPLASTY, TONSILLECTOMY AND SEPTOPLASTY  1994    There were no vitals filed for this visit.      Subjective Assessment - 12/09/16 1014    Subjective doing well today; good tolerance to updated HEP   Pertinent History gastric bypass   Diagnostic tests Xray - normal healing   Patient Stated Goals improve pain and function   Currently in Pain? Yes   Pain Score 3    Pain Location Shoulder   Pain Orientation Right   Pain Descriptors / Indicators  Aching;Constant;Dull   Pain Type Acute pain                         [facility] Adult PT Treatment/Exercise - 12/09/16 1016      Shoulder Exercises: Standing   Flexion AAROM;Right;12 reps   Flexion Limitations wall ladder     Shoulder Exercises: Pulleys   Flexion 3 minutes   ABduction 3 minutes  scaption     Shoulder Exercises: ROM/Strengthening   UBE (Upper Arm Bike) L1 x 4 min (2/2) - L UE doing work with R UE passive   Wall Wash R UE flexion - 15 x 5-10 sec hold; R UE abduction/scaption 15 x 5 sec holds     Manual Therapy   Manual Therapy Passive ROM   Manual therapy comments patient hooklying with bolster   Passive ROM PROM R shoulder all planes - much improved range with abduction/scaption most limited                  PT Short Term Goals - 12/05/16 1055      PT SHORT TERM GOAL #1   Title patient to be independent with initial HEP    Status On-going     PT SHORT TERM GOAL #2   Title patient to improve R shoulder PROM equal to that of L shoulder without pain limiting    Status On-going           PT Long Term Goals - 12/05/16 1055      PT LONG TERM GOAL #1   Title patient to be independent with advanced HEP   Status On-going     PT LONG TERM GOAL #2   Title patient to improve R shoulder AROM equal to that of L shoulder without pain limiting   Status On-going     PT LONG TERM GOAL #3   Title patient to improve gross R UE strength to >/= 4/5   Status On-going     PT LONG TERM GOAL #4   Title patient to report ability to perform ADLs and household tasks without pain limiting function   Status On-going     PT LONG TERM GOAL #5   Title patient to report ability to return to work at full capacity.    Status On-going               Plan - 12/09/16 1016    Clinical Impression Statement Much improved R shoulder ROM since initial visit. Does have slight tendency to guard passive motions and likely has more range, however, PT  allowing pain to dictate current range. Good progression today with wall wash in both flexion and abduction as well as good tolerance with all home isometrics. DIscussion to continue cane AAROM at home as well as isometrics with good carryover. Making good progress  towards goals.    PT Treatment/Interventions ADLs/Self Care Home Management;Cryotherapy;Electrical Stimulation;Iontophoresis 4mg /ml Dexamethasone;Moist Heat;Ultrasound;Therapeutic exercise;Therapeutic activities;Patient/family education;Manual techniques;Passive range of motion;Vasopneumatic Device;Taping;Dry needling   Consulted and Agree with Plan of Care Patient      Patient will benefit from skilled therapeutic intervention in order to improve the following deficits and impairments:  Decreased activity tolerance, Decreased range of motion, Decreased strength, Impaired UE functional use, Pain  Visit Diagnosis: Acute pain of right shoulder  Stiffness of right shoulder, not elsewhere classified  Abnormal posture  Muscle weakness (generalized)     Problem List Patient Active Problem List   Diagnosis Date Noted  . Cervical disc disorder with radiculopathy of cervical region 04/25/2016  . Nonallopathic lesion of cervical region 04/17/2016  . Nonallopathic lesion of thoracic region 04/17/2016  . Nonallopathic lesion of rib cage 04/17/2016  . Nonallopathic lesion of lumbosacral region 04/17/2016  . Degenerative cervical disc 03/29/2016  . Degenerative arthritis of right knee 03/29/2016  . Acute blood loss anemia   . UGIB (upper gastrointestinal bleed) 03/10/2016  . Upper GI bleed 03/10/2016  . 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 Abdominal pain, epigastric   . Adjustment disorder with depressed mood 07/14/2014  . Hx of colonic polyps   . 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 Chronic constipation 03/05/2013  . Coronary atherosclerosis of native coronary artery 08/27/2011  . History of uterine cancer  08/15/2011  . Hypercholesterolemia 08/15/2011  . History of colonic polyps 12/29/2009     Lanney Gins, PT, DPT 12/09/16 10:56 AM   [facility] [address]  [place], [place], [address] Phone: [phone]   Fax:  [phone]  Name: ILLYANNA PETILLO MRN: [id number] Date of Birth: 12/05/1960

## 2016-12-12 ENCOUNTER — Ambulatory Visit: Payer: 59 | Admitting: Physical Therapy

## 2016-12-12 DIAGNOSIS — R293 Abnormal posture: Secondary | ICD-10-CM | POA: Diagnosis not present

## 2016-12-12 DIAGNOSIS — M25611 Stiffness of right shoulder, not elsewhere classified: Secondary | ICD-10-CM | POA: Diagnosis not present

## 2016-12-12 DIAGNOSIS — M25511 Pain in right shoulder: Secondary | ICD-10-CM

## 2016-12-12 DIAGNOSIS — M6281 Muscle weakness (generalized): Secondary | ICD-10-CM

## 2016-12-12 NOTE — Therapy (Signed)
[facility] [address]  [place], [place], [address] Phone: [phone]   Fax:  [phone]  Physical Therapy Treatment  Patient Details  Name: Sandra [name] MRN: [id number] Date of Birth: [birth year] Referring Provider: Dr. Mardelle Matte  Encounter Date: 12/12/2016      PT End of Session - 12/12/16 1350    Visit Number 4   Number of Visits 12   Date for PT Re-Evaluation 01/13/17   Authorization Type [facility]   PT Start Time 1347   PT Stop Time 1429   PT Time Calculation (min) 42 min   Activity Tolerance Patient tolerated treatment well   Behavior During Therapy [facility] for tasks assessed/performed      Past Medical History:  Diagnosis Date  . Anxiety   . Arthritis   . CAD (coronary artery disease) NON-OBSTRUCTIVE PER DR HOCHREIN NOTE 08-26-2011  . Cancer [facility]) 2006   uterine  . Complication of anesthesia PT STATES HAS LETTER FROM [place] [facility]. STATING THAT SHE HAS A VERY SMALL AIRWAY (LAST SURG. HYSTERECTOMY AT [facility] IN 2006   PT STATES LYNN BEASON CRNA WHOM WORKS HERE TOLD SHE WOULD BE OKAY TO BE DONE AT [facility]  . Diabetes mellitus    no meds taken now, checks cbg once or twice a week   . 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 GERD (gastroesophageal reflux disease)   . Heart murmur    with last visit, MD states "outgrown" murmur  . Hemorrhoids, internal, with bleeding and Grade 2 prolapse 03/05/2013   All positions seen an anoscopy RP columns banded 03/05/2013 LL and RA banded 03/18/2013     . History of endometrial cancer 2006 ---- S/P ABD. HYSTERECTOMY   STAGE I ENDOMETROID CARCINOMA  . Hx of adenomatous colonic polyps 2011   BENIGN  . Hyperlipidemia    no meds now  . Hypertension   . Morbid obesity, weight - 203, BMI - 38.4 05/12/2013  . OSA on CPAP    cpap setting of 16  . Sleep apnea    no CPAP since surgery    Past Surgical History:  Procedure Laterality Date  . ABDOMINAL HYSTERECTOMY   12-20-2004   TAH  . BILATERAL BREAST REDUCTION  11-16-1999  . BREATH TEK H PYLORI N/A 05/27/2013   Procedure: BREATH TEK H PYLORI;  Surgeon: Shann Medal, MD;  Location[facility];  Service: General;  Laterality: N/A;  . [place]  . COLONOSCOPY    . COLONOSCOPY N/A 04/25/2014   Procedure: COLONOSCOPY;  Surgeon: Gatha Mayer, MD;  [facility];  Service: Endoscopy;  Laterality: N/A;  . CYSTOSCOPY  09/06/2011   Procedure: CYSTOSCOPY;  Surgeon: Ailene Rud, MD;  Location: [facility];  Service: Urology;  Laterality: N/A;  LYNX SLING   . ESOPHAGOGASTRODUODENOSCOPY (EGD) WITH PROPOFOL N/A 09/27/2014   Procedure: ESOPHAGOGASTRODUODENOSCOPY (EGD) WITH PROPOFOL;  Surgeon: Gatha Mayer, MD;  [facility];  Service: Endoscopy;  Laterality: N/A;  . ESOPHAGOGASTRODUODENOSCOPY (EGD) WITH PROPOFOL N/A 03/11/2016   Procedure: ESOPHAGOGASTRODUODENOSCOPY (EGD) WITH PROPOFOL;  Surgeon: Jerene Bears, MD;  [facility];  Service: Endoscopy;  Laterality: N/A;  . GASTRIC ROUX-EN-Y N/A 10/04/2013   Procedure: LAPAROSCOPIC ROUX-EN-Y GASTRIC BYPASS WITH UPPER ENDOSCOPY;  Surgeon: Shann Medal, MD;  [facility];  Service: General;  Laterality: N/A;  . HEMORRHOID BANDING  02/2013  . PUBOVAGINAL SLING  09/06/2011   Procedure: PUBO-VAGINAL SLING;  Surgeon: Ailene Rud, MD;  Location: [facility];  Service: Urology;  Laterality: N/A;  . UVULOPALATOPHARYNGOPLASTY, TONSILLECTOMY AND SEPTOPLASTY  1994    There were no vitals filed for this visit.      Subjective Assessment - 12/12/16 1349    Subjective has been doing well - trying to help granddaughter switch bedroom   Diagnostic tests Xray - normal healing   Patient Stated Goals improve pain and function   Currently in Pain? Yes   Pain Score 2    Pain Location Shoulder   Pain Orientation Right   Pain Descriptors / Indicators Aching;Sore   Pain Type Acute pain                          [facility] Adult PT Treatment/Exercise - 12/12/16 1351      Shoulder Exercises: Standing   External Rotation Right;15 reps;Theraband   Theraband Level (Shoulder External Rotation) Level 1 (Yellow)   External Rotation Limitations isometric   Internal Rotation Right;15 reps;Theraband   Theraband Level (Shoulder Internal Rotation) Level 1 (Yellow)   Internal Rotation Limitations isometric   Row Both;15 reps;Theraband   Theraband Level (Shoulder Row) Level 1 (Yellow)     Shoulder Exercises: ROM/Strengthening   UBE (Upper Arm Bike) L1 x 6 min (3/3)   Wall Wash R UE flexion - 15 x 5-10 sec hold; R UE abduction/scaption 15 x 5 sec holds   Wall Pushups 15 reps   Other ROM/Strengthening Exercises bicep curls - 3# R UE x 15     Manual Therapy   Manual Therapy Passive ROM;Soft tissue mobilization   Manual therapy comments patient supine   Soft tissue mobilization STM to R deltoid and posterior shoulder complex   Passive ROM PROM R shoulder - all planes                  PT Short Term Goals - 12/05/16 1055      PT SHORT TERM GOAL #1   Title patient to be independent with initial HEP    Status On-going     PT SHORT TERM GOAL #2   Title patient to improve R shoulder PROM equal to that of L shoulder without pain limiting    Status On-going           PT Long Term Goals - 12/05/16 1055      PT LONG TERM GOAL #1   Title patient to be independent with advanced HEP   Status On-going     PT LONG TERM GOAL #2   Title patient to improve R shoulder AROM equal to that of L shoulder without pain limiting   Status On-going     PT LONG TERM GOAL #3   Title patient to improve gross R UE strength to >/= 4/5   Status On-going     PT LONG TERM GOAL #4   Title patient to report ability to perform ADLs and household tasks without pain limiting function   Status On-going     PT LONG TERM GOAL #5   Title patient to report ability to return to  work at full capacity.    Status On-going               Plan - 12/12/16 1350    Clinical Impression Statement Patient doing well today - improved pain reports. Doing well with all strengthening and ROM activities. Able to progress to theraband isometrics today with hopeful  progression to gentle theraband strengthening in upcoming visits. Patient to continue to benefit from PT to maximize functional use of R UE to allow for return to work and leisure activities.    PT Treatment/Interventions ADLs/Self Care Home Management;Cryotherapy;Electrical Stimulation;Iontophoresis 4mg /ml Dexamethasone;Moist Heat;Ultrasound;Therapeutic exercise;Therapeutic activities;Patient/family education;Manual techniques;Passive range of motion;Vasopneumatic Device;Taping;Dry needling   Consulted and Agree with Plan of Care Patient      Patient will benefit from skilled therapeutic intervention in order to improve the following deficits and impairments:  Decreased activity tolerance, Decreased range of motion, Decreased strength, Impaired UE functional use, Pain  Visit Diagnosis: Acute pain of right shoulder  Stiffness of right shoulder, not elsewhere classified  Abnormal posture  Muscle weakness (generalized)     Problem List Patient Active Problem List   Diagnosis Date Noted  . Cervical disc disorder with radiculopathy of cervical region 04/25/2016  . Nonallopathic lesion of cervical region 04/17/2016  . Nonallopathic lesion of thoracic region 04/17/2016  . Nonallopathic lesion of rib cage 04/17/2016  . Nonallopathic lesion of lumbosacral region 04/17/2016  . Degenerative cervical disc 03/29/2016  . Degenerative arthritis of right knee 03/29/2016  . Acute blood loss anemia   . UGIB (upper gastrointestinal bleed) 03/10/2016  . Upper GI bleed 03/10/2016  . 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 Abdominal pain, epigastric   . Adjustment disorder with depressed mood 07/14/2014  . Hx of colonic  polyps   . 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 Chronic constipation 03/05/2013  . Coronary atherosclerosis of native coronary artery 08/27/2011  . History of uterine cancer 08/15/2011  . Hypercholesterolemia 08/15/2011  . History of colonic polyps 12/29/2009     Lanney Gins, PT, DPT 12/12/16 2:30 PM   [facility] [address]  [place], [place], [address] Phone: [phone]   Fax:  [phone]  Name: Sandra [name] MRN: [id number] Date of Birth: [birth year]

## 2016-12-12 NOTE — Patient Instructions (Signed)
Resistive Band Rowing    With resistive band anchored in door, grasp both ends. Keeping elbows bent, pull back, squeezing shoulder blades together. Hold __5__ seconds. Repeat _15___ times. Do __2__ sessions per day.

## 2016-12-16 ENCOUNTER — Ambulatory Visit: Payer: 59 | Admitting: Physical Therapy

## 2016-12-16 DIAGNOSIS — M25511 Pain in right shoulder: Secondary | ICD-10-CM

## 2016-12-16 DIAGNOSIS — R293 Abnormal posture: Secondary | ICD-10-CM | POA: Diagnosis not present

## 2016-12-16 DIAGNOSIS — M6281 Muscle weakness (generalized): Secondary | ICD-10-CM | POA: Diagnosis not present

## 2016-12-16 DIAGNOSIS — M25611 Stiffness of right shoulder, not elsewhere classified: Secondary | ICD-10-CM

## 2016-12-16 NOTE — Therapy (Signed)
[facility] [address]  [place], [place], [address] Phone: [phone]   Fax:  [phone]  Physical Therapy Treatment  Patient Details  Name: Sandra [name] MRN: [id number] Date of Birth: Mar 10, 1961 Referring Provider: Dr. Mardelle Matte  Encounter Date: 12/16/2016      PT End of Session - 12/16/16 1012    Visit Number 5   Number of Visits 12   Date for PT Re-Evaluation 01/13/17   Authorization Type [facility]   PT Start Time 1008   PT Stop Time 1054   PT Time Calculation (min) 46 min   Activity Tolerance Patient tolerated treatment well   Behavior During Therapy [facility] for tasks assessed/performed      Past Medical History:  Diagnosis Date  . Anxiety   . Arthritis   . CAD (coronary artery disease) NON-OBSTRUCTIVE PER DR HOCHREIN NOTE 08-26-2011  . Cancer [facility]) 2006   uterine  . Complication of anesthesia PT STATES HAS LETTER FROM [place] [facility]. STATING THAT SHE HAS A VERY SMALL AIRWAY (LAST SURG. HYSTERECTOMY AT [facility] IN 2006   PT STATES LYNN BEASON CRNA WHOM WORKS HERE TOLD SHE WOULD BE OKAY TO BE DONE AT [facility]  . Diabetes mellitus    no meds taken now, checks cbg once or twice a week   . 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 GERD (gastroesophageal reflux disease)   . Heart murmur    with last visit, MD states "outgrown" murmur  . Hemorrhoids, internal, with bleeding and Grade 2 prolapse 03/05/2013   All positions seen an anoscopy RP columns banded 03/05/2013 LL and RA banded 03/18/2013     . History of endometrial cancer 2006 ---- S/P ABD. HYSTERECTOMY   STAGE I ENDOMETROID CARCINOMA  . Hx of adenomatous colonic polyps 2011   BENIGN  . Hyperlipidemia    no meds now  . Hypertension   . Morbid obesity, weight - 203, BMI - 38.4 05/12/2013  . OSA on CPAP    cpap setting of 16  . Sleep apnea    no CPAP since surgery    Past Surgical History:  Procedure Laterality Date  . ABDOMINAL HYSTERECTOMY   12-20-2004   TAH  . BILATERAL BREAST REDUCTION  11-16-1999  . BREATH TEK H PYLORI N/A 05/27/2013   Procedure: BREATH TEK H PYLORI;  Surgeon: Shann Medal, MD;  Location: [facility];  Service: General;  Laterality: N/A;  . [place]  . COLONOSCOPY    . COLONOSCOPY N/A 04/25/2014   Procedure: COLONOSCOPY;  Surgeon: Gatha Mayer, MD;  Location: [facility];  Service: Endoscopy;  Laterality: N/A;  . CYSTOSCOPY  09/06/2011   Procedure: CYSTOSCOPY;  Surgeon: Ailene Rud, MD;  Location[facility];  Service: Urology;  Laterality: N/A;  LYNX SLING   . ESOPHAGOGASTRODUODENOSCOPY (EGD) WITH PROPOFOL N/A 09/27/2014   Procedure: ESOPHAGOGASTRODUODENOSCOPY (EGD) WITH PROPOFOL;  Surgeon: Gatha Mayer, MD;  [facility]: [facility];  Service: Endoscopy;  Laterality: N/A;  . ESOPHAGOGASTRODUODENOSCOPY (EGD) WITH PROPOFOL N/A 03/11/2016   Procedure: ESOPHAGOGASTRODUODENOSCOPY (EGD) WITH PROPOFOL;  Surgeon: Jerene Bears, MD;  Location: [facility];  Service: Endoscopy;  Laterality: N/A;  . GASTRIC ROUX-EN-Y N/A 10/04/2013   Procedure: LAPAROSCOPIC ROUX-EN-Y GASTRIC BYPASS WITH UPPER ENDOSCOPY;  Surgeon: Shann Medal, MD;  Location: [facility];  Service: General;  Laterality: N/A;  . HEMORRHOID BANDING  02/2013  . PUBOVAGINAL SLING  09/06/2011   Procedure: PUBO-VAGINAL SLING;  Surgeon: Ailene Rud, MD;  Location: [facility];  Service: Urology;  Laterality: N/A;  . UVULOPALATOPHARYNGOPLASTY, TONSILLECTOMY AND SEPTOPLASTY  1994    There were no vitals filed for this visit.      Subjective Assessment - 12/16/16 1010    Subjective feeling well - no new complaints   Pertinent History gastric bypass   Diagnostic tests Xray - normal healing   Patient Stated Goals improve pain and function   Currently in Pain? Yes   Pain Score 3    Pain Location Shoulder   Pain Orientation Right   Pain Descriptors / Indicators Sore   Pain Type Acute pain                          [facility] Adult PT Treatment/Exercise - 12/16/16 1013      Shoulder Exercises: Standing   External Rotation Right;15 reps;Theraband   Theraband Level (Shoulder External Rotation) Level 1 (Yellow)   Internal Rotation Right;15 reps;Theraband   Theraband Level (Shoulder Internal Rotation) Level 1 (Yellow)   Extension Both;15 reps;Theraband   Theraband Level (Shoulder Extension) Level 1 (Yellow)   Extension Limitations with scap squeeze   Row Both;15 reps;Theraband   Theraband Level (Shoulder Row) Level 1 (Yellow)   Row Limitations VC to reduced UT involvement     Shoulder Exercises: ROM/Strengthening   UBE (Upper Arm Bike) L1 x 6 min (3/3)   Wall Pushups 15 reps   Rhythmic Stabilization, Supine green medball - R UE - CW/CCW x 15 reps each     Manual Therapy   Manual Therapy Passive ROM;Soft tissue mobilization   Manual therapy comments patient supine   Soft tissue mobilization STM to R teres/infra group - tender and tight throughout; education on self massage with ball against wall   Passive ROM PROM R shoulder - all planes - improved motion                  PT Short Term Goals - 12/05/16 1055      PT SHORT TERM GOAL #1   Title patient to be independent with initial HEP    Status On-going     PT SHORT TERM GOAL #2   Title patient to improve R shoulder PROM equal to that of L shoulder without pain limiting    Status On-going           PT Long Term Goals - 12/05/16 1055      PT LONG TERM GOAL #1   Title patient to be independent with advanced HEP   Status On-going     PT LONG TERM GOAL #2   Title patient to improve R shoulder AROM equal to that of L shoulder without pain limiting   Status On-going     PT LONG TERM GOAL #3   Title patient to improve gross R UE strength to >/= 4/5   Status On-going     PT LONG TERM GOAL #4   Title patient to report ability to perform ADLs and household tasks without pain limiting  function   Status On-going     PT LONG TERM GOAL #5   Title patient to report ability to return to work at full capacity.    Status On-going               Plan - 12/16/16 1013    Clinical Impression Statement Sandra [name] doing well today - reports ability to use riding lawn  mower with little issue. Patient able to progress resistance with motion today wtih good tolerance. COntinues to gradually improve PROm in all planes. Some tightness/tenderness at teres/infraspinatus group with STM with education given for self massage with good carryover.    PT Treatment/Interventions ADLs/Self Care Home Management;Cryotherapy;Electrical Stimulation;Iontophoresis 4mg /ml Dexamethasone;Moist Heat;Ultrasound;Therapeutic exercise;Therapeutic activities;Patient/family education;Manual techniques;Passive range of motion;Vasopneumatic Device;Taping;Dry needling   Consulted and Agree with Plan of Care Patient      Patient will benefit from skilled therapeutic intervention in order to improve the following deficits and impairments:  Decreased activity tolerance, Decreased range of motion, Decreased strength, Impaired UE functional use, Pain  Visit Diagnosis: Acute pain of right shoulder  Stiffness of right shoulder, not elsewhere classified  Abnormal posture  Muscle weakness (generalized)     Problem List Patient Active Problem List   Diagnosis Date Noted  . Cervical disc disorder with radiculopathy of cervical region 04/25/2016  . Nonallopathic lesion of cervical region 04/17/2016  . Nonallopathic lesion of thoracic region 04/17/2016  . Nonallopathic lesion of rib cage 04/17/2016  . Nonallopathic lesion of lumbosacral region 04/17/2016  . Degenerative cervical disc 03/29/2016  . Degenerative arthritis of right knee 03/29/2016  . Acute blood loss anemia   . UGIB (upper gastrointestinal bleed) 03/10/2016  . Upper GI bleed 03/10/2016  . 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 Abdominal pain,  epigastric   . Adjustment disorder with depressed mood 07/14/2014  . Hx of colonic polyps   . 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 Chronic constipation 03/05/2013  . Coronary atherosclerosis of native coronary artery 08/27/2011  . History of uterine cancer 08/15/2011  . Hypercholesterolemia 08/15/2011  . History of colonic polyps 12/29/2009     Lanney Gins, PT, DPT 12/16/16 11:09 AM   [facility] [address]  [place], [place], [address] Phone: [phone]   Fax:  [phone]  Name: Sandra [name] MRN: [id number] Date of Birth: 22-Aug-1960

## 2016-12-18 DIAGNOSIS — S42024D Nondisplaced fracture of shaft of right clavicle, subsequent encounter for fracture with routine healing: Secondary | ICD-10-CM | POA: Diagnosis not present

## 2016-12-19 ENCOUNTER — Ambulatory Visit: Payer: 59 | Admitting: Physical Therapy

## 2016-12-20 ENCOUNTER — Ambulatory Visit: Payer: 59

## 2016-12-20 DIAGNOSIS — M6281 Muscle weakness (generalized): Secondary | ICD-10-CM | POA: Diagnosis not present

## 2016-12-20 DIAGNOSIS — R293 Abnormal posture: Secondary | ICD-10-CM | POA: Diagnosis not present

## 2016-12-20 DIAGNOSIS — M25511 Pain in right shoulder: Secondary | ICD-10-CM

## 2016-12-20 DIAGNOSIS — M25611 Stiffness of right shoulder, not elsewhere classified: Secondary | ICD-10-CM

## 2016-12-20 NOTE — Therapy (Signed)
[facility] [address]  [place], [place], [address] Phone: [phone]   Fax:  [phone]  Physical Therapy Treatment  Patient Details  Name: Sandra [name] MRN: [id number] Date of Birth: 1961-04-02 Referring Provider: Dr. Mardelle Matte  Encounter Date: 12/20/2016      PT End of Session - 12/20/16 0932    Visit Number 6   Number of Visits 12   Date for PT Re-Evaluation 01/13/17   Authorization Type [facility]   PT Start Time 0923   PT Stop Time 1014   PT Time Calculation (min) 51 min   Activity Tolerance Patient tolerated treatment well   Behavior During Therapy [facility] for tasks assessed/performed      Past Medical History:  Diagnosis Date  . Anxiety   . Arthritis   . CAD (coronary artery disease) NON-OBSTRUCTIVE PER DR HOCHREIN NOTE 08-26-2011  . Cancer [facility]) 2006   uterine  . Complication of anesthesia PT STATES HAS LETTER FROM [place] [facility]. STATING THAT SHE HAS A VERY SMALL AIRWAY (LAST SURG. HYSTERECTOMY AT [facility] IN 2006   PT STATES LYNN BEASON CRNA WHOM WORKS HERE TOLD SHE WOULD BE OKAY TO BE DONE AT [facility]  . Diabetes mellitus    no meds taken now, checks cbg once or twice a week   . 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 GERD (gastroesophageal reflux disease)   . Heart murmur    with last visit, MD states "outgrown" murmur  . Hemorrhoids, internal, with bleeding and Grade 2 prolapse 03/05/2013   All positions seen an anoscopy RP columns banded 03/05/2013 LL and RA banded 03/18/2013     . History of endometrial cancer 2006 ---- S/P ABD. HYSTERECTOMY   STAGE I ENDOMETROID CARCINOMA  . Hx of adenomatous colonic polyps 2011   BENIGN  . Hyperlipidemia    no meds now  . Hypertension   . Morbid obesity, weight - 203, BMI - 38.4 05/12/2013  . OSA on CPAP    cpap setting of 16  . Sleep apnea    no CPAP since surgery    Past Surgical History:  Procedure Laterality Date  . ABDOMINAL HYSTERECTOMY   12-20-2004   TAH  . BILATERAL BREAST REDUCTION  11-16-1999  . BREATH TEK H PYLORI N/A 05/27/2013   Procedure: BREATH TEK H PYLORI;  Surgeon: Shann Medal, MD;  Location: [facility];  Service: General;  Laterality: N/A;  . [place]  . COLONOSCOPY    . COLONOSCOPY N/A 04/25/2014   Procedure: COLONOSCOPY;  Surgeon: Gatha Mayer, MD;  [facility];  Service: Endoscopy;  Laterality: N/A;  . CYSTOSCOPY  09/06/2011   Procedure: CYSTOSCOPY;  Surgeon: Ailene Rud, MD;  Location: [facility];  Service: Urology;  Laterality: N/A;  LYNX SLING   . ESOPHAGOGASTRODUODENOSCOPY (EGD) WITH PROPOFOL N/A 09/27/2014   Procedure: ESOPHAGOGASTRODUODENOSCOPY (EGD) WITH PROPOFOL;  Surgeon: Gatha Mayer, MD;  [facility];  Service: Endoscopy;  Laterality: N/A;  . ESOPHAGOGASTRODUODENOSCOPY (EGD) WITH PROPOFOL N/A 03/11/2016   Procedure: ESOPHAGOGASTRODUODENOSCOPY (EGD) WITH PROPOFOL;  Surgeon: Jerene Bears, MD;  [facility];  Service: Endoscopy;  Laterality: N/A;  . GASTRIC ROUX-EN-Y N/A 10/04/2013   Procedure: LAPAROSCOPIC ROUX-EN-Y GASTRIC BYPASS WITH UPPER ENDOSCOPY;  Surgeon: Shann Medal, MD;  [facility];  Service: General;  Laterality: N/A;  . HEMORRHOID BANDING  02/2013  . PUBOVAGINAL SLING  09/06/2011   Procedure: PUBO-VAGINAL SLING;  Surgeon: Ailene Rud, MD;  Location: [facility];  Service: Urology;  Laterality: N/A;  . UVULOPALATOPHARYNGOPLASTY, TONSILLECTOMY AND SEPTOPLASTY  1994    There were no vitals filed for this visit.      Subjective Assessment - 12/20/16 0925    Subjective Continued R shoulder sorenes with reaching overhead.  Notes lifting arm has gotten easier since starting therapy.     Patient Stated Goals improve pain and function   Currently in Pain? Yes   Pain Score 3    Pain Location Shoulder   Pain Orientation Right   Pain Descriptors / Indicators Dull;Aching   Pain Type  Acute pain   Pain Onset More than a month ago   Pain Frequency Constant   Aggravating Factors  Reaching overhead,    Multiple Pain Sites No                         [facility] Adult PT Treatment/Exercise - 12/20/16 0934      Shoulder Exercises: Supine   External Rotation AAROM;Right;10 reps   External Rotation Limitations Therapist assist into gentle stretch    Flexion AAROM;Both;10 reps   Flexion Limitations Therapist assist for gentle stretch     Shoulder Exercises: Sidelying   External Rotation Right;20 reps   External Rotation Weight (lbs) 1   External Rotation Limitations towel roll    ABduction Right;10 reps   ABduction Limitations 0-85 dg; pain at end range   Other Sidelying Exercises R shoulder circles CW, CCW 2# (small 12") x 20 each way     Shoulder Exercises: Standing   Protraction Right;20 reps   Protraction Weight (lbs) 3   External Rotation Right;15 reps;Theraband   Theraband Level (Shoulder External Rotation) Level 1 (Yellow)   Internal Rotation Right;Theraband;20 reps   Theraband Level (Shoulder Internal Rotation) Level 1 (Yellow)   Internal Rotation Limitations towel roll    Flexion AAROM;5 reps   Flexion Limitations wall walk into gentle 5" stretch   ABduction Right;5 reps   ABduction Limitations scaption; wall walk into gentle 5" stretch   Extension Both;Theraband;20 reps   Theraband Level (Shoulder Extension) Level 1 (Yellow)   Extension Limitations with scap squeeze     Shoulder Exercises: ROM/Strengthening   UBE (Upper Arm Bike) L1 x 6 min (3/3)     Shoulder Exercises: Isometric Strengthening   Flexion --   External Rotation 5X5"   ABduction 5X5"     Manual Therapy   Manual Therapy Passive ROM;Soft tissue mobilization   Manual therapy comments patient supine   Soft tissue mobilization STM to R teres/infra group - still tender and tight    Passive ROM gentle therapist assistance for stretch with wand flexion, ER in supine                  PT Education - 12/20/16 1042    Education provided Yes   Education Details yellow TB IR, ER, and shoulder extension   Person(s) Educated Patient   Methods Explanation;Demonstration;Verbal cues;Handout   Comprehension Verbalized understanding;Returned demonstration;Verbal cues required;Need further instruction          PT Short Term Goals - 12/05/16 1055      PT SHORT TERM GOAL #1   Title patient to be independent with initial HEP    Status On-going     PT SHORT TERM GOAL #2   Title patient to improve R shoulder PROM equal to that of L shoulder without pain limiting  Status On-going           PT Long Term Goals - 12/05/16 1055      PT LONG TERM GOAL #1   Title patient to be independent with advanced HEP   Status On-going     PT LONG TERM GOAL #2   Title patient to improve R shoulder AROM equal to that of L shoulder without pain limiting   Status On-going     PT LONG TERM GOAL #3   Title patient to improve gross R UE strength to >/= 4/5   Status On-going     PT LONG TERM GOAL #4   Title patient to report ability to perform ADLs and household tasks without pain limiting function   Status On-going     PT LONG TERM GOAL #5   Title patient to report ability to return to work at full capacity.    Status On-going               Plan - 12/20/16 0932    Clinical Impression Statement Pt. doing well today.  Noting she has had some relief with self-tennis ball massage on wall.  Some progression in strengthening therex, which was well tolerated today.  HEP updated with band strengthening activities with pt. able to demo all these activities well in treatment.  Will continued to progress per pt. tolerance in coming visits.      PT Treatment/Interventions ADLs/Self Care Home Management;Cryotherapy;Electrical Stimulation;Iontophoresis 4mg /ml Dexamethasone;Moist Heat;Ultrasound;Therapeutic exercise;Therapeutic activities;Patient/family  education;Manual techniques;Passive range of motion;Vasopneumatic Device;Taping;Dry needling      Patient will benefit from skilled therapeutic intervention in order to improve the following deficits and impairments:  Decreased activity tolerance, Decreased range of motion, Decreased strength, Impaired UE functional use, Pain  Visit Diagnosis: Acute pain of right shoulder  Stiffness of right shoulder, not elsewhere classified  Abnormal posture  Muscle weakness (generalized)     Problem List Patient Active Problem List   Diagnosis Date Noted  . Cervical disc disorder with radiculopathy of cervical region 04/25/2016  . Nonallopathic lesion of cervical region 04/17/2016  . Nonallopathic lesion of thoracic region 04/17/2016  . Nonallopathic lesion of rib cage 04/17/2016  . Nonallopathic lesion of lumbosacral region 04/17/2016  . Degenerative cervical disc 03/29/2016  . Degenerative arthritis of right knee 03/29/2016  . Acute blood loss anemia   . UGIB (upper gastrointestinal bleed) 03/10/2016  . Upper GI bleed 03/10/2016  . 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 Abdominal pain, epigastric   . Adjustment disorder with depressed mood 07/14/2014  . Hx of colonic polyps   . 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 Chronic constipation 03/05/2013  . Coronary atherosclerosis of native coronary artery 08/27/2011  . History of uterine cancer 08/15/2011  . Hypercholesterolemia 08/15/2011  . History of colonic polyps 12/29/2009    Bess Harvest, PTA 12/20/16 12:18 PM  [facility] [address]  [place], [place], [address] Phone: [phone]   Fax:  [phone]  Name: Sandra [name] MRN: [id number] Date of Birth: 1960/10/08

## 2016-12-23 ENCOUNTER — Ambulatory Visit: Payer: 59 | Admitting: Physical Therapy

## 2016-12-23 DIAGNOSIS — M25611 Stiffness of right shoulder, not elsewhere classified: Secondary | ICD-10-CM | POA: Diagnosis not present

## 2016-12-23 DIAGNOSIS — R293 Abnormal posture: Secondary | ICD-10-CM

## 2016-12-23 DIAGNOSIS — M6281 Muscle weakness (generalized): Secondary | ICD-10-CM | POA: Diagnosis not present

## 2016-12-23 DIAGNOSIS — M25511 Pain in right shoulder: Secondary | ICD-10-CM | POA: Diagnosis not present

## 2016-12-23 NOTE — Therapy (Signed)
[facility] [address]  [place], [place], [address] Phone: [phone]   Fax:  [phone]  Physical Therapy Treatment  Patient Details  Name: Sandra [name] MRN: [id number] Date of Birth: 08-10-1960 Referring Provider: Dr. Mardelle Matte  Encounter Date: 12/23/2016      PT End of Session - 12/23/16 1024    Visit Number 7   Number of Visits 12   Date for PT Re-Evaluation 01/13/17   Authorization Type [facility]   PT Start Time 1020   PT Stop Time 1104   PT Time Calculation (min) 44 min   Activity Tolerance Patient tolerated treatment well   Behavior During Therapy [facility] for tasks assessed/performed      Past Medical History:  Diagnosis Date  . Anxiety   . Arthritis   . CAD (coronary artery disease) NON-OBSTRUCTIVE PER DR HOCHREIN NOTE 08-26-2011  . Cancer [facility]) 2006   uterine  . Complication of anesthesia PT STATES HAS LETTER FROM [place] [facility]. STATING THAT SHE HAS A VERY SMALL AIRWAY (LAST SURG. HYSTERECTOMY AT [facility] IN 2006   PT STATES LYNN BEASON CRNA WHOM WORKS HERE TOLD SHE WOULD BE OKAY TO BE DONE AT [facility]  . Diabetes mellitus    no meds taken now, checks cbg once or twice a week   . 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 GERD (gastroesophageal reflux disease)   . Heart murmur    with last visit, MD states "outgrown" murmur  . Hemorrhoids, internal, with bleeding and Grade 2 prolapse 03/05/2013   All positions seen an anoscopy RP columns banded 03/05/2013 LL and RA banded 03/18/2013     . History of endometrial cancer 2006 ---- S/P ABD. HYSTERECTOMY   STAGE I ENDOMETROID CARCINOMA  . Hx of adenomatous colonic polyps 2011   BENIGN  . Hyperlipidemia    no meds now  . Hypertension   . Morbid obesity, weight - 203, BMI - 38.4 05/12/2013  . OSA on CPAP    cpap setting of 16  . Sleep apnea    no CPAP since surgery    Past Surgical History:  Procedure Laterality Date  . ABDOMINAL HYSTERECTOMY   12-20-2004   TAH  . BILATERAL BREAST REDUCTION  11-16-1999  . BREATH TEK H PYLORI N/A 05/27/2013   Procedure: BREATH TEK H PYLORI;  Surgeon: Shann Medal, MD;  Location: [facility];  Service: General;  Laterality: N/A;  . [place]  . COLONOSCOPY    . COLONOSCOPY N/A 04/25/2014   Procedure: COLONOSCOPY;  Surgeon: Gatha Mayer, MD;  Location: [facility];  Service: Endoscopy;  Laterality: N/A;  . CYSTOSCOPY  09/06/2011   Procedure: CYSTOSCOPY;  Surgeon: Ailene Rud, MD;  Location: [facility];  Service: Urology;  Laterality: N/A;  LYNX SLING   . ESOPHAGOGASTRODUODENOSCOPY (EGD) WITH PROPOFOL N/A 09/27/2014   Procedure: ESOPHAGOGASTRODUODENOSCOPY (EGD) WITH PROPOFOL;  Surgeon: Gatha Mayer, MD;  [facility]: [facility];  Service: Endoscopy;  Laterality: N/A;  . ESOPHAGOGASTRODUODENOSCOPY (EGD) WITH PROPOFOL N/A 03/11/2016   Procedure: ESOPHAGOGASTRODUODENOSCOPY (EGD) WITH PROPOFOL;  Surgeon: Jerene Bears, MD;  Location: [facility];  Service: Endoscopy;  Laterality: N/A;  . GASTRIC ROUX-EN-Y N/A 10/04/2013   Procedure: LAPAROSCOPIC ROUX-EN-Y GASTRIC BYPASS WITH UPPER ENDOSCOPY;  Surgeon: Shann Medal, MD;  Location: [facility];  Service: General;  Laterality: N/A;  . HEMORRHOID BANDING  02/2013  . PUBOVAGINAL SLING  09/06/2011   Procedure: PUBO-VAGINAL SLING;  Surgeon: Ailene Rud, MD;  Location: [facility];  Service: Urology;  Laterality: N/A;  . UVULOPALATOPHARYNGOPLASTY, TONSILLECTOMY AND SEPTOPLASTY  1994    There were no vitals filed for this visit.      Subjective Assessment - 12/23/16 1024    Subjective able to do more stuff at home - careful with lifitng   Pertinent History gastric bypass   Diagnostic tests Xray - normal healing   Patient Stated Goals improve pain and function   Currently in Pain? Yes   Pain Score 2    Pain Location Shoulder   Pain Orientation Right   Pain Descriptors / Indicators Sore   Pain  Type Acute pain                         [facility] Adult PT Treatment/Exercise - 12/23/16 1026      Shoulder Exercises: Supine   Other Supine Exercises serratus punches - 3# B UE x 15     Shoulder Exercises: Sidelying   External Rotation Right;15 reps;Weights   External Rotation Weight (lbs) 2   External Rotation Limitations towel roll    ABduction Right;15 reps   ABduction Limitations to approx 90-95 degrees     Shoulder Exercises: Standing   External Rotation Right;15 reps;Theraband   Theraband Level (Shoulder External Rotation) Level 2 (Red)   Internal Rotation Strengthening;Right;15 reps;Theraband   Theraband Level (Shoulder Internal Rotation) Level 2 (Red)   Extension Strengthening;Both;15 reps;Theraband   Theraband Level (Shoulder Extension) Level 2 (Red)   Row Both;15 reps;Theraband   Theraband Level (Shoulder Row) Level 2 (Red)   Other Standing Exercises R UE cabinet reaches - flexion and abduction x 15 with 1#     Shoulder Exercises: ROM/Strengthening   UBE (Upper Arm Bike) L2.5 x 6 min (3/3)     Manual Therapy   Manual Therapy Soft tissue mobilization   Manual therapy comments patient sidelying   Soft tissue mobilization STM to lateral teres/infra grouo, infra body, deltoid, UT, and bicep insertion                  PT Short Term Goals - 12/05/16 1055      PT SHORT TERM GOAL #1   Title patient to be independent with initial HEP    Status On-going     PT SHORT TERM GOAL #2   Title patient to improve R shoulder PROM equal to that of L shoulder without pain limiting    Status On-going           PT Long Term Goals - 12/05/16 1055      PT LONG TERM GOAL #1   Title patient to be independent with advanced HEP   Status On-going     PT LONG TERM GOAL #2   Title patient to improve R shoulder AROM equal to that of L shoulder without pain limiting   Status On-going     PT LONG TERM GOAL #3   Title patient to improve gross R UE strength  to >/= 4/5   Status On-going     PT LONG TERM GOAL #4   Title patient to report ability to perform ADLs and household tasks without pain limiting function   Status On-going     PT LONG TERM GOAL #5   Title patient to report ability to return to work at full capacity.    Status On-going  Plan - 12/23/16 1025    Clinical Impression Statement Dorris continues to progress well with PT intervention. Does continue to lack a good bit of active motion, that will hopefully continue to improve with continued strength training. Discussion of DN at next visit for hopeful improvements in MSK pain and ROM restrictions.    PT Treatment/Interventions ADLs/Self Care Home Management;Cryotherapy;Electrical Stimulation;Iontophoresis 4mg /ml Dexamethasone;Moist Heat;Ultrasound;Therapeutic exercise;Therapeutic activities;Patient/family education;Manual techniques;Passive range of motion;Vasopneumatic Device;Taping;Dry needling   Consulted and Agree with Plan of Care Patient      Patient will benefit from skilled therapeutic intervention in order to improve the following deficits and impairments:  Decreased activity tolerance, Decreased range of motion, Decreased strength, Impaired UE functional use, Pain  Visit Diagnosis: Acute pain of right shoulder  Stiffness of right shoulder, not elsewhere classified  Abnormal posture  Muscle weakness (generalized)     Problem List Patient Active Problem List   Diagnosis Date Noted  . Cervical disc disorder with radiculopathy of cervical region 04/25/2016  . Nonallopathic lesion of cervical region 04/17/2016  . Nonallopathic lesion of thoracic region 04/17/2016  . Nonallopathic lesion of rib cage 04/17/2016  . Nonallopathic lesion of lumbosacral region 04/17/2016  . Degenerative cervical disc 03/29/2016  . Degenerative arthritis of right knee 03/29/2016  . Acute blood loss anemia   . UGIB (upper gastrointestinal bleed) 03/10/2016  .  Upper GI bleed 03/10/2016  . 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 Abdominal pain, epigastric   . Adjustment disorder with depressed mood 07/14/2014  . Hx of colonic polyps   . 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 Chronic constipation 03/05/2013  . Coronary atherosclerosis of native coronary artery 08/27/2011  . History of uterine cancer 08/15/2011  . Hypercholesterolemia 08/15/2011  . History of colonic polyps 12/29/2009     Lanney Gins, PT, DPT 12/23/16 11:23 AM   [facility] [address]  [place], [place], [address] Phone: [phone]   Fax:  [phone]  Name: Sandra [name] MRN: [id number] Date of Birth: 1961/03/22

## 2016-12-26 ENCOUNTER — Ambulatory Visit: Payer: 59 | Admitting: Physical Therapy

## 2016-12-26 DIAGNOSIS — M25611 Stiffness of right shoulder, not elsewhere classified: Secondary | ICD-10-CM

## 2016-12-26 DIAGNOSIS — M25511 Pain in right shoulder: Secondary | ICD-10-CM | POA: Diagnosis not present

## 2016-12-26 DIAGNOSIS — M6281 Muscle weakness (generalized): Secondary | ICD-10-CM | POA: Diagnosis not present

## 2016-12-26 DIAGNOSIS — R293 Abnormal posture: Secondary | ICD-10-CM | POA: Diagnosis not present

## 2016-12-26 NOTE — Patient Instructions (Signed)

## 2016-12-26 NOTE — Therapy (Signed)
[facility] [address]  [place], [place], [address] Phone: [phone]   Fax:  [phone]  Physical Therapy Treatment  Patient Details  Name: Sandra [name] MRN: [id number] Date of Birth: 1960/12/30 Referring Provider: Dr. Mardelle Matte  Encounter Date: 12/26/2016      PT End of Session - 12/26/16 1013    Visit Number 8   Number of Visits 12   Date for PT Re-Evaluation 01/13/17   Authorization Type [facility]   PT Start Time 1010   PT Stop Time 1051   PT Time Calculation (min) 41 min   Activity Tolerance Patient tolerated treatment well   Behavior During Therapy [facility] for tasks assessed/performed      Past Medical History:  Diagnosis Date  . Anxiety   . Arthritis   . CAD (coronary artery disease) NON-OBSTRUCTIVE PER DR HOCHREIN NOTE 08-26-2011  . Cancer [facility]) 2006   uterine  . Complication of anesthesia PT STATES HAS LETTER FROM [place] [facility]. STATING THAT SHE HAS A VERY SMALL AIRWAY (LAST SURG. HYSTERECTOMY AT [facility] IN 2006   PT STATES LYNN BEASON CRNA WHOM WORKS HERE TOLD SHE WOULD BE OKAY TO BE DONE AT [facility]  . Diabetes mellitus    no meds taken now, checks cbg once or twice a week   . 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 GERD (gastroesophageal reflux disease)   . Heart murmur    with last visit, MD states "outgrown" murmur  . Hemorrhoids, internal, with bleeding and Grade 2 prolapse 03/05/2013   All positions seen an anoscopy RP columns banded 03/05/2013 LL and RA banded 03/18/2013     . History of endometrial cancer 2006 ---- S/P ABD. HYSTERECTOMY   STAGE I ENDOMETROID CARCINOMA  . Hx of adenomatous colonic polyps 2011   BENIGN  . Hyperlipidemia    no meds now  . Hypertension   . Morbid obesity, weight - 203, BMI - 38.4 05/12/2013  . OSA on CPAP    cpap setting of 16  . Sleep apnea    no CPAP since surgery    Past Surgical History:  Procedure Laterality Date  . ABDOMINAL HYSTERECTOMY   12-20-2004   TAH  . BILATERAL BREAST REDUCTION  11-16-1999  . BREATH TEK H PYLORI N/A 05/27/2013   Procedure: BREATH TEK H PYLORI;  Surgeon: Shann Medal, MD;  [facility];  Service: General;  Laterality: N/A;  . [place]  . COLONOSCOPY    . COLONOSCOPY N/A 04/25/2014   Procedure: COLONOSCOPY;  Surgeon: Gatha Mayer, MD;  [facility];  Service: Endoscopy;  Laterality: N/A;  . CYSTOSCOPY  09/06/2011   Procedure: CYSTOSCOPY;  Surgeon: Ailene Rud, MD;  Location: [facility];  Service: Urology;  Laterality: N/A;  LYNX SLING   . ESOPHAGOGASTRODUODENOSCOPY (EGD) WITH PROPOFOL N/A 09/27/2014   Procedure: ESOPHAGOGASTRODUODENOSCOPY (EGD) WITH PROPOFOL;  Surgeon: Gatha Mayer, MD;  [facility];  Service: Endoscopy;  Laterality: N/A;  . ESOPHAGOGASTRODUODENOSCOPY (EGD) WITH PROPOFOL N/A 03/11/2016   Procedure: ESOPHAGOGASTRODUODENOSCOPY (EGD) WITH PROPOFOL;  Surgeon: Jerene Bears, MD;  [facility];  Service: Endoscopy;  Laterality: N/A;  . GASTRIC ROUX-EN-Y N/A 10/04/2013   Procedure: LAPAROSCOPIC ROUX-EN-Y GASTRIC BYPASS WITH UPPER ENDOSCOPY;  Surgeon: Shann Medal, MD;  [facility];  Service: General;  Laterality: N/A;  . HEMORRHOID BANDING  02/2013  . PUBOVAGINAL SLING  09/06/2011   Procedure: PUBO-VAGINAL SLING;  Surgeon: Ailene Rud, MD;  Location: [facility];  Service: Urology;  Laterality: N/A;  . UVULOPALATOPHARYNGOPLASTY, TONSILLECTOMY AND SEPTOPLASTY  1994    There were no vitals filed for this visit.      Subjective Assessment - 12/26/16 1012    Subjective feeling well today - no new complaints   Pertinent History gastric bypass   Diagnostic tests Xray - normal healing   Patient Stated Goals improve pain and function   Currently in Pain? Yes   Pain Score 2    Pain Location Shoulder   Pain Orientation Right   Pain Descriptors / Indicators Aching   Pain Type Acute  pain                         [facility] Adult PT Treatment/Exercise - 12/26/16 1013      Shoulder Exercises: Standing   External Rotation Right;15 reps;Theraband   Theraband Level (Shoulder External Rotation) Level 2 (Red)   Internal Rotation Strengthening;Right;15 reps;Theraband   Theraband Level (Shoulder Internal Rotation) Level 2 (Red)   Row Both;15 reps;Theraband   Theraband Level (Shoulder Row) Level 2 (Red)   Other Standing Exercises tricep extensions - red tband x 15 reps     Shoulder Exercises: ROM/Strengthening   UBE (Upper Arm Bike) L3 x 6 min (3/3)     Manual Therapy   Manual Therapy Soft tissue mobilization   Manual therapy comments patient sidelying   Soft tissue mobilization STM to lateral infra/teres. lats, deltoid          Trigger Point Dry Needling - 12/26/16 1652    Consent Given? Yes   Education Handout Provided Yes   Muscles Treated Upper Body --  upper lateral lats, deltoid (middle and posterior)                PT Short Term Goals - 12/05/16 1055      PT SHORT TERM GOAL #1   Title patient to be independent with initial HEP    Status On-going     PT SHORT TERM GOAL #2   Title patient to improve R shoulder PROM equal to that of L shoulder without pain limiting    Status On-going           PT Long Term Goals - 12/05/16 1055      PT LONG TERM GOAL #1   Title patient to be independent with advanced HEP   Status On-going     PT LONG TERM GOAL #2   Title patient to improve R shoulder AROM equal to that of L shoulder without pain limiting   Status On-going     PT LONG TERM GOAL #3   Title patient to improve gross R UE strength to >/= 4/5   Status On-going     PT LONG TERM GOAL #4   Title patient to report ability to perform ADLs and household tasks without pain limiting function   Status On-going     PT LONG TERM GOAL #5   Title patient to report ability to return to work at full capacity.    Status On-going                Plan - 12/26/16 1013    Clinical Impression Statement Patient doing well - reports using R UE for active motions to cabinet level this week. DN treatment today to upper lateral lats and detoid with good response. Patient able to progress well with  red tband with this updated and sent home with good carryover. Patient to continue to progress towards goals.    PT Treatment/Interventions ADLs/Self Care Home Management;Cryotherapy;Electrical Stimulation;Iontophoresis 4mg /ml Dexamethasone;Moist Heat;Ultrasound;Therapeutic exercise;Therapeutic activities;Patient/family education;Manual techniques;Passive range of motion;Vasopneumatic Device;Taping;Dry needling   Consulted and Agree with Plan of Care Patient      Patient will benefit from skilled therapeutic intervention in order to improve the following deficits and impairments:  Decreased activity tolerance, Decreased range of motion, Decreased strength, Impaired UE functional use, Pain  Visit Diagnosis: Acute pain of right shoulder  Stiffness of right shoulder, not elsewhere classified  Abnormal posture  Muscle weakness (generalized)     Problem List Patient Active Problem List   Diagnosis Date Noted  . Cervical disc disorder with radiculopathy of cervical region 04/25/2016  . Nonallopathic lesion of cervical region 04/17/2016  . Nonallopathic lesion of thoracic region 04/17/2016  . Nonallopathic lesion of rib cage 04/17/2016  . Nonallopathic lesion of lumbosacral region 04/17/2016  . Degenerative cervical disc 03/29/2016  . Degenerative arthritis of right knee 03/29/2016  . Acute blood loss anemia   . UGIB (upper gastrointestinal bleed) 03/10/2016  . Upper GI bleed 03/10/2016  . 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 Abdominal pain, epigastric   . Adjustment disorder with depressed mood 07/14/2014  . Hx of colonic polyps   . 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 Chronic constipation  03/05/2013  . Coronary atherosclerosis of native coronary artery 08/27/2011  . History of uterine cancer 08/15/2011  . Hypercholesterolemia 08/15/2011  . History of colonic polyps 12/29/2009     Lanney Gins, PT, DPT 12/26/16 4:57 PM   [facility] [address]  [place], [place], [address] Phone: [phone]   Fax:  [phone]  Name: Sandra [name] MRN: [id number] Date of Birth: 02/02/1961

## 2016-12-31 ENCOUNTER — Other Ambulatory Visit: Payer: Self-pay | Admitting: Family Medicine

## 2016-12-31 ENCOUNTER — Ambulatory Visit: Payer: 59 | Attending: Orthopedic Surgery | Admitting: Physical Therapy

## 2016-12-31 DIAGNOSIS — M25611 Stiffness of right shoulder, not elsewhere classified: Secondary | ICD-10-CM | POA: Diagnosis not present

## 2016-12-31 DIAGNOSIS — M25511 Pain in right shoulder: Secondary | ICD-10-CM | POA: Insufficient documentation

## 2016-12-31 DIAGNOSIS — M6281 Muscle weakness (generalized): Secondary | ICD-10-CM | POA: Insufficient documentation

## 2016-12-31 DIAGNOSIS — R293 Abnormal posture: Secondary | ICD-10-CM | POA: Insufficient documentation

## 2016-12-31 NOTE — Telephone Encounter (Signed)
Refill done.  

## 2016-12-31 NOTE — Therapy (Signed)
[facility] [address]  [place], [place], [address] Phone: [phone]   Fax:  [phone]  Physical Therapy Treatment  Patient Details  Name: Sandra [name] MRN: [id number] Date of Birth: 1961-01-27 Referring Provider: Dr. Mardelle Matte  Encounter Date: 12/31/2016      PT End of Session - 12/31/16 1106    Visit Number 9   Number of Visits 12   Date for PT Re-Evaluation 01/13/17   Authorization Type [facility]   PT Start Time 1018   PT Stop Time 1100   PT Time Calculation (min) 42 min   Activity Tolerance Patient tolerated treatment well   Behavior During Therapy [facility] for tasks assessed/performed      Past Medical History:  Diagnosis Date  . Anxiety   . Arthritis   . CAD (coronary artery disease) NON-OBSTRUCTIVE PER DR HOCHREIN NOTE 08-26-2011  . Cancer [facility]) 2006   uterine  . Complication of anesthesia PT STATES HAS LETTER FROM [place] [facility]. STATING THAT SHE HAS A VERY SMALL AIRWAY (LAST SURG. HYSTERECTOMY AT [facility] IN 2006   PT STATES LYNN BEASON CRNA WHOM WORKS HERE TOLD SHE WOULD BE OKAY TO BE DONE AT [facility]  . Diabetes mellitus    no meds taken now, checks cbg once or twice a week   . 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 GERD (gastroesophageal reflux disease)   . Heart murmur    with last visit, MD states "outgrown" murmur  . Hemorrhoids, internal, with bleeding and Grade 2 prolapse 03/05/2013   All positions seen an anoscopy RP columns banded 03/05/2013 LL and RA banded 03/18/2013     . History of endometrial cancer 2006 ---- S/P ABD. HYSTERECTOMY   STAGE I ENDOMETROID CARCINOMA  . Hx of adenomatous colonic polyps 2011   BENIGN  . Hyperlipidemia    no meds now  . Hypertension   . Morbid obesity, weight - 203, BMI - 38.4 05/12/2013  . OSA on CPAP    cpap setting of 16  . Sleep apnea    no CPAP since surgery    Past Surgical History:  Procedure Laterality Date  . ABDOMINAL HYSTERECTOMY   12-20-2004   TAH  . BILATERAL BREAST REDUCTION  11-16-1999  . BREATH TEK H PYLORI N/A 05/27/2013   Procedure: BREATH TEK H PYLORI;  Surgeon: Shann Medal, MD;  Location: [facility];  Service: General;  Laterality: N/A;  . [place]  . COLONOSCOPY    . COLONOSCOPY N/A 04/25/2014   Procedure: COLONOSCOPY;  Surgeon: Gatha Mayer, MD;  Location: [facility];  Service: Endoscopy;  Laterality: N/A;  . CYSTOSCOPY  09/06/2011   Procedure: CYSTOSCOPY;  Surgeon: Ailene Rud, MD;  Location: [facility];  Service: Urology;  Laterality: N/A;  LYNX SLING   . ESOPHAGOGASTRODUODENOSCOPY (EGD) WITH PROPOFOL N/A 09/27/2014   Procedure: ESOPHAGOGASTRODUODENOSCOPY (EGD) WITH PROPOFOL;  Surgeon: Gatha Mayer, MD;  [facility];  Service: Endoscopy;  Laterality: N/A;  . ESOPHAGOGASTRODUODENOSCOPY (EGD) WITH PROPOFOL N/A 03/11/2016   Procedure: ESOPHAGOGASTRODUODENOSCOPY (EGD) WITH PROPOFOL;  Surgeon: Jerene Bears, MD;  [facility];  Service: Endoscopy;  Laterality: N/A;  . GASTRIC ROUX-EN-Y N/A 10/04/2013   Procedure: LAPAROSCOPIC ROUX-EN-Y GASTRIC BYPASS WITH UPPER ENDOSCOPY;  Surgeon: Shann Medal, MD;  Location: [facility];  Service: General;  Laterality: N/A;  . HEMORRHOID BANDING  02/2013  . PUBOVAGINAL SLING  09/06/2011   Procedure: PUBO-VAGINAL SLING;  Surgeon: Ailene Rud, MD;  Location: [facility];  Service: Urology;  Laterality: N/A;  . UVULOPALATOPHARYNGOPLASTY, TONSILLECTOMY AND SEPTOPLASTY  1994    There were no vitals filed for this visit.      Subjective Assessment - 12/31/16 1020    Subjective Feels like DN really helped   Pertinent History gastric bypass   Diagnostic tests Xray - normal healing   Patient Stated Goals improve pain and function   Currently in Pain? No/denies            [facility] PT Assessment - 12/31/16 0001      PROM   PROM Assessment Site Shoulder   Right/Left Shoulder Right    Right Shoulder Flexion 148 Degrees   Right Shoulder ABduction 123 Degrees   Right Shoulder Internal Rotation 76 Degrees   Right Shoulder External Rotation 50 Degrees                     [facility] Adult PT Treatment/Exercise - 12/31/16 1021      Shoulder Exercises: Prone   Retraction Strengthening;Right;15 reps;Weights   Retraction Weight (lbs) 2   Retraction Limitations prone row   Extension Strengthening;Right;15 reps;Weights   Extension Weight (lbs) 2   Extension Limitations prone I's    Horizontal ABduction 1 Strengthening;Right;15 reps   Horizontal ABduction 1 Limitations prone T's     Shoulder Exercises: Sidelying   External Rotation Right;15 reps;Weights   External Rotation Weight (lbs) 3   External Rotation Limitations towel roll    ABduction Right;15 reps   ABduction Weight (lbs) 1   ABduction Limitations slightly past 90 degrees      Shoulder Exercises: Standing   Other Standing Exercises tricep extensions/bicep curls - green tband x 15 reps   Other Standing Exercises R UE cabinet reaches - flexion (2#) and abduction (2#) x 15      Shoulder Exercises: ROM/Strengthening   UBE (Upper Arm Bike) L3 x 6 min (3/3)   Wall Pushups 15 reps   Pushups Limitations with orange pball against wall   Rhythmic Stabilization, Supine Red medball - R UE - CW/CCW x 15 reps each     Manual Therapy   Manual Therapy Soft tissue mobilization;Passive ROM   Manual therapy comments patient supine   Soft tissue mobilization STM to lateral teres/infra and lats   Passive ROM PROM into all planes with gentle stretch - much improved motion                  PT Short Term Goals - 12/05/16 1055      PT SHORT TERM GOAL #1   Title patient to be independent with initial HEP    Status On-going     PT SHORT TERM GOAL #2   Title patient to improve R shoulder PROM equal to that of L shoulder without pain limiting    Status On-going           PT Long Term Goals - 12/05/16  1055      PT LONG TERM GOAL #1   Title patient to be independent with advanced HEP   Status On-going     PT LONG TERM GOAL #2   Title patient to improve R shoulder AROM equal to that of L shoulder without pain limiting   Status On-going     PT LONG TERM GOAL #3   Title patient to improve gross R UE strength to >/= 4/5   Status On-going  PT LONG TERM GOAL #4   Title patient to report ability to perform ADLs and household tasks without pain limiting function   Status On-going     PT LONG TERM GOAL #5   Title patient to report ability to return to work at full capacity.    Status On-going               Plan - 12/31/16 1107    Clinical Impression Statement Sandra [name] continues to make good progress with all ROM and strengthening based activities. PROM much improved today with some pain continued at end ranges of available motion. Patient to continue to progress towards goals.   PT Treatment/Interventions ADLs/Self Care Home Management;Cryotherapy;Electrical Stimulation;Iontophoresis 4mg /ml Dexamethasone;Moist Heat;Ultrasound;Therapeutic exercise;Therapeutic activities;Patient/family education;Manual techniques;Passive range of motion;Vasopneumatic Device;Taping;Dry needling   Consulted and Agree with Plan of Care Patient      Patient will benefit from skilled therapeutic intervention in order to improve the following deficits and impairments:  Decreased activity tolerance, Decreased range of motion, Decreased strength, Impaired UE functional use, Pain  Visit Diagnosis: Acute pain of right shoulder  Stiffness of right shoulder, not elsewhere classified  Abnormal posture  Muscle weakness (generalized)     Problem List Patient Active Problem List   Diagnosis Date Noted  . Cervical disc disorder with radiculopathy of cervical region 04/25/2016  . Nonallopathic lesion of cervical region 04/17/2016  . Nonallopathic lesion of thoracic region 04/17/2016  . Nonallopathic  lesion of rib cage 04/17/2016  . Nonallopathic lesion of lumbosacral region 04/17/2016  . Degenerative cervical disc 03/29/2016  . Degenerative arthritis of right knee 03/29/2016  . Acute blood loss anemia   . UGIB (upper gastrointestinal bleed) 03/10/2016  . Upper GI bleed 03/10/2016  . 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 Abdominal pain, epigastric   . Adjustment disorder with depressed mood 07/14/2014  . Hx of colonic polyps   . 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 Chronic constipation 03/05/2013  . Coronary atherosclerosis of native coronary artery 08/27/2011  . History of uterine cancer 08/15/2011  . Hypercholesterolemia 08/15/2011  . History of colonic polyps 12/29/2009     Lanney Gins, PT, DPT 12/31/16 11:08 AM  [facility] [address]  [place], [place], [address] Phone: [phone]   Fax:  [phone]  Name: Sandra [name] MRN: [id number] Date of Birth: [birth year]

## 2017-01-03 ENCOUNTER — Ambulatory Visit: Payer: 59 | Admitting: Physical Therapy

## 2017-01-06 ENCOUNTER — Ambulatory Visit: Payer: 59

## 2017-01-07 ENCOUNTER — Ambulatory Visit: Payer: 59 | Admitting: Physical Therapy

## 2017-01-07 DIAGNOSIS — M25511 Pain in right shoulder: Secondary | ICD-10-CM | POA: Diagnosis not present

## 2017-01-07 DIAGNOSIS — M6281 Muscle weakness (generalized): Secondary | ICD-10-CM

## 2017-01-07 DIAGNOSIS — R293 Abnormal posture: Secondary | ICD-10-CM

## 2017-01-07 DIAGNOSIS — M25611 Stiffness of right shoulder, not elsewhere classified: Secondary | ICD-10-CM | POA: Diagnosis not present

## 2017-01-07 NOTE — Therapy (Signed)
[facility] [address]  [place], [place], [address] Phone: [phone]   Fax:  [phone]  Physical Therapy Treatment  Patient Details  Name: Sandra [name] MRN: [id number] Date of Birth: Mar 01, 1961 Referring Provider: Dr. Mardelle Matte  Encounter Date: 01/07/2017      PT End of Session - 01/07/17 1020    Visit Number 10   Number of Visits 12   Date for PT Re-Evaluation 01/13/17   Authorization Type [facility]   PT Start Time 1017   PT Stop Time 1110  moist heat   PT Time Calculation (min) 53 min   Activity Tolerance Patient tolerated treatment well   Behavior During Therapy [facility] for tasks assessed/performed      Past Medical History:  Diagnosis Date  . Anxiety   . Arthritis   . CAD (coronary artery disease) NON-OBSTRUCTIVE PER DR HOCHREIN NOTE 08-26-2011  . Cancer [facility]) 2006   uterine  . Complication of anesthesia PT STATES HAS LETTER FROM [place] [facility]. STATING THAT SHE HAS A VERY SMALL AIRWAY (LAST SURG. HYSTERECTOMY AT [facility] IN 2006   PT STATES LYNN BEASON CRNA WHOM WORKS HERE TOLD SHE WOULD BE OKAY TO BE DONE AT [facility]  . Diabetes mellitus    no meds taken now, checks cbg once or twice a week   . 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 GERD (gastroesophageal reflux disease)   . Heart murmur    with last visit, MD states "outgrown" murmur  . Hemorrhoids, internal, with bleeding and Grade 2 prolapse 03/05/2013   All positions seen an anoscopy RP columns banded 03/05/2013 LL and RA banded 03/18/2013     . History of endometrial cancer 2006 ---- S/P ABD. HYSTERECTOMY   STAGE I ENDOMETROID CARCINOMA  . Hx of adenomatous colonic polyps 2011   BENIGN  . Hyperlipidemia    no meds now  . Hypertension   . Morbid obesity, weight - 203, BMI - 38.4 05/12/2013  . OSA on CPAP    cpap setting of 16  . Sleep apnea    no CPAP since surgery    Past Surgical History:  Procedure Laterality Date  . ABDOMINAL  HYSTERECTOMY  12-20-2004   TAH  . BILATERAL BREAST REDUCTION  11-16-1999  . BREATH TEK H PYLORI N/A 05/27/2013   Procedure: BREATH TEK H PYLORI;  Surgeon: Shann Medal, MD;  [facility];  Service: General;  Laterality: N/A;  . [place]  . COLONOSCOPY    . COLONOSCOPY N/A 04/25/2014   Procedure: COLONOSCOPY;  Surgeon: Gatha Mayer, MD;  [facility];  Service: Endoscopy;  Laterality: N/A;  . CYSTOSCOPY  09/06/2011   Procedure: CYSTOSCOPY;  Surgeon: Ailene Rud, MD;  Location: [facility];  Service: Urology;  Laterality: N/A;  LYNX SLING   . ESOPHAGOGASTRODUODENOSCOPY (EGD) WITH PROPOFOL N/A 09/27/2014   Procedure: ESOPHAGOGASTRODUODENOSCOPY (EGD) WITH PROPOFOL;  Surgeon: Gatha Mayer, MD;  [facility];  Service: Endoscopy;  Laterality: N/A;  . ESOPHAGOGASTRODUODENOSCOPY (EGD) WITH PROPOFOL N/A 03/11/2016   Procedure: ESOPHAGOGASTRODUODENOSCOPY (EGD) WITH PROPOFOL;  Surgeon: Jerene Bears, MD;  [facility];  Service: Endoscopy;  Laterality: N/A;  . GASTRIC ROUX-EN-Y N/A 10/04/2013   Procedure: LAPAROSCOPIC ROUX-EN-Y GASTRIC BYPASS WITH UPPER ENDOSCOPY;  Surgeon: Shann Medal, MD;  [facility];  Service: General;  Laterality: N/A;  . HEMORRHOID BANDING  02/2013  . PUBOVAGINAL SLING  09/06/2011  Procedure: Gaynelle Arabian;  Surgeon: Ailene Rud, MD;  Location: [facility];  Service: Urology;  Laterality: N/A;  . UVULOPALATOPHARYNGOPLASTY, TONSILLECTOMY AND SEPTOPLASTY  1994    There were no vitals filed for this visit.      Subjective Assessment - 01/07/17 1019    Subjective shoulder has been "okay"   Pertinent History gastric bypass   Diagnostic tests Xray - normal healing   Patient Stated Goals improve pain and function   Currently in Pain? Yes   Pain Score 3    Pain Location Shoulder   Pain Orientation Right   Pain Descriptors / Indicators Aching;Sore   Pain Type  Acute pain            [facility] PT Assessment - 01/07/17 0001      AROM   Right Shoulder Flexion 151 Degrees   Right Shoulder ABduction 125 Degrees     PROM   Right Shoulder Flexion 158 Degrees   Right Shoulder ABduction 152 Degrees   Right Shoulder Internal Rotation 70 Degrees   Right Shoulder External Rotation 51 Degrees                     [facility] Adult PT Treatment/Exercise - 01/07/17 1021      Shoulder Exercises: Supine   Other Supine Exercises serratus punch - 4# B UE 2 x 15     Shoulder Exercises: Prone   Retraction Strengthening;Right;15 reps;Weights   Retraction Weight (lbs) 3   Retraction Limitations prone row   Extension Strengthening;Right;15 reps;Weights   Extension Weight (lbs) 3   Extension Limitations prone I's    Horizontal ABduction 1 Strengthening;Right;15 reps   Horizontal ABduction 1 Weight (lbs) 1   Horizontal ABduction 1 Limitations prone T's; limited motion     Shoulder Exercises: Sidelying   External Rotation Right;15 reps;Weights   External Rotation Weight (lbs) 3   External Rotation Limitations towel roll    ABduction Right;15 reps   ABduction Weight (lbs) 1   ABduction Limitations slightly past 90 degrees      Shoulder Exercises: Standing   Other Standing Exercises PNF D1 flexion/extension - red tband x 15     Shoulder Exercises: ROM/Strengthening   UBE (Upper Arm Bike) L3 x 6 min (3/3)     Shoulder Exercises: Stretch   Other Shoulder Stretches R UT stretch - manual by PT 2 x 30 sec     Modalities   Modalities Moist Heat     Moist Heat Therapy   Number Minutes Moist Heat 10 Minutes   Moist Heat Location Shoulder          Trigger Point Dry Needling - 01/07/17 1410    Consent Given? Yes   Muscles Treated Upper Body Upper trapezius   Upper Trapezius Response Twitch reponse elicited;Palpable increased muscle length                PT Short Term Goals - 12/05/16 1055      PT SHORT TERM GOAL #1   Title  patient to be independent with initial HEP    Status On-going     PT SHORT TERM GOAL #2   Title patient to improve R shoulder PROM equal to that of L shoulder without pain limiting    Status On-going           PT Long Term Goals - 12/05/16 1055      PT LONG TERM GOAL #1   Title patient to be independent  with advanced HEP   Status On-going     PT LONG TERM GOAL #2   Title patient to improve R shoulder AROM equal to that of L shoulder without pain limiting   Status On-going     PT LONG TERM GOAL #3   Title patient to improve gross R UE strength to >/= 4/5   Status On-going     PT LONG TERM GOAL #4   Title patient to report ability to perform ADLs and household tasks without pain limiting function   Status On-going     PT LONG TERM GOAL #5   Title patient to report ability to return to work at full capacity.    Status On-going               Plan - 01/07/17 1021    Clinical Impression Statement Patient has made excellent progress with AROM/PROM of R shoulder since initial visit - does continue to have pain at end ranges of motion as well as some strength deficits that are expected. 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 Some DN continued today at R UT, as patient feels this area is most "tight" or limited during active movements. Patient plans to see MD 1 week from today for follow-up.    PT Treatment/Interventions ADLs/Self Care Home Management;Cryotherapy;Electrical Stimulation;Iontophoresis 4mg /ml Dexamethasone;Moist Heat;Ultrasound;Therapeutic exercise;Therapeutic activities;Patient/family education;Manual techniques;Passive range of motion;Vasopneumatic Device;Taping;Dry needling   Consulted and Agree with Plan of Care Patient      Patient will benefit from skilled therapeutic intervention in order to improve the following deficits and impairments:  Decreased activity tolerance, Decreased range of motion, Decreased  strength, Impaired UE functional use, Pain  Visit Diagnosis: Acute pain of right shoulder  Stiffness of right shoulder, not elsewhere classified  Abnormal posture  Muscle weakness (generalized)     Problem List Patient Active Problem List   Diagnosis Date Noted  . Cervical disc disorder with radiculopathy of cervical region 04/25/2016  . Nonallopathic lesion of cervical region 04/17/2016  . Nonallopathic lesion of thoracic region 04/17/2016  . Nonallopathic lesion of rib cage 04/17/2016  . Nonallopathic lesion of lumbosacral region 04/17/2016  . Degenerative cervical disc 03/29/2016  . Degenerative arthritis of right knee 03/29/2016  . Acute blood loss anemia   . UGIB (upper gastrointestinal bleed) 03/10/2016  . Upper GI bleed 03/10/2016  . 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 Abdominal pain, epigastric   . Adjustment disorder with depressed mood 07/14/2014  . Hx of colonic polyps   . 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 Chronic constipation 03/05/2013  . Coronary atherosclerosis of native coronary artery 08/27/2011  . History of uterine cancer 08/15/2011  . Hypercholesterolemia 08/15/2011  . History of colonic polyps 12/29/2009     Lanney Gins, PT, DPT 01/07/17 2:12 PM   [facility] [address]  [place], [place], [address] Phone: [phone]   Fax:  [phone]  Name: RUBY LOGIUDICE MRN: [id number] Date of Birth: Feb 02, 1961

## 2017-01-09 ENCOUNTER — Ambulatory Visit: Payer: 59 | Admitting: Physical Therapy

## 2017-01-09 DIAGNOSIS — R293 Abnormal posture: Secondary | ICD-10-CM | POA: Diagnosis not present

## 2017-01-09 DIAGNOSIS — M25611 Stiffness of right shoulder, not elsewhere classified: Secondary | ICD-10-CM

## 2017-01-09 DIAGNOSIS — M6281 Muscle weakness (generalized): Secondary | ICD-10-CM

## 2017-01-09 DIAGNOSIS — M25511 Pain in right shoulder: Secondary | ICD-10-CM

## 2017-01-09 NOTE — Therapy (Signed)
[facility] [address]  [place], [place], [address] Phone: [phone]   Fax:  [phone]  Physical Therapy Treatment  Patient Details  Name: Sandra [name] MRN: [id number] Date of Birth: 1960/05/20 Referring Provider: Dr. Mardelle Matte  Encounter Date: 01/09/2017      PT End of Session - 01/09/17 1053    Visit Number 11   Number of Visits 12   Date for PT Re-Evaluation 01/13/17   Authorization Type [facility]   PT Start Time 9371   PT Stop Time 1053   PT Time Calculation (min) 38 min   Activity Tolerance Patient tolerated treatment well   Behavior During Therapy [facility] for tasks assessed/performed      Past Medical History:  Diagnosis Date  . Anxiety   . Arthritis   . CAD (coronary artery disease) NON-OBSTRUCTIVE PER DR HOCHREIN NOTE 08-26-2011  . Cancer [facility]) 2006   uterine  . Complication of anesthesia PT STATES HAS LETTER FROM [place] [facility]. STATING THAT SHE HAS A VERY SMALL AIRWAY (LAST SURG. HYSTERECTOMY AT [facility] IN 2006   PT STATES LYNN BEASON CRNA WHOM WORKS HERE TOLD SHE WOULD BE OKAY TO BE DONE AT [facility]  . Diabetes mellitus    no meds taken now, checks cbg once or twice a week   . 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 GERD (gastroesophageal reflux disease)   . Heart murmur    with last visit, MD states "outgrown" murmur  . Hemorrhoids, internal, with bleeding and Grade 2 prolapse 03/05/2013   All positions seen an anoscopy RP columns banded 03/05/2013 LL and RA banded 03/18/2013     . History of endometrial cancer 2006 ---- S/P ABD. HYSTERECTOMY   STAGE I ENDOMETROID CARCINOMA  . Hx of adenomatous colonic polyps 2011   BENIGN  . Hyperlipidemia    no meds now  . Hypertension   . Morbid obesity, weight - 203, BMI - 38.4 05/12/2013  . OSA on CPAP    cpap setting of 16  . Sleep apnea    no CPAP since surgery    Past Surgical History:  Procedure Laterality Date  . ABDOMINAL HYSTERECTOMY   12-20-2004   TAH  . BILATERAL BREAST REDUCTION  11-16-1999  . BREATH TEK H PYLORI N/A 05/27/2013   Procedure: BREATH TEK H PYLORI;  Surgeon: Shann Medal, MD;  [facility];  Service: General;  Laterality: N/A;  . [place]  . COLONOSCOPY    . COLONOSCOPY N/A 04/25/2014   Procedure: COLONOSCOPY;  Surgeon: Gatha Mayer, MD;  Location: [facility];  Service: Endoscopy;  Laterality: N/A;  . CYSTOSCOPY  09/06/2011   Procedure: CYSTOSCOPY;  Surgeon: Ailene Rud, MD;  Location: [facility];  Service: Urology;  Laterality: N/A;  LYNX SLING   . ESOPHAGOGASTRODUODENOSCOPY (EGD) WITH PROPOFOL N/A 09/27/2014   Procedure: ESOPHAGOGASTRODUODENOSCOPY (EGD) WITH PROPOFOL;  Surgeon: Gatha Mayer, MD;  [facility];  Service: Endoscopy;  Laterality: N/A;  . ESOPHAGOGASTRODUODENOSCOPY (EGD) WITH PROPOFOL N/A 03/11/2016   Procedure: ESOPHAGOGASTRODUODENOSCOPY (EGD) WITH PROPOFOL;  Surgeon: Jerene Bears, MD;  Location: [facility];  Service: Endoscopy;  Laterality: N/A;  . GASTRIC ROUX-EN-Y N/A 10/04/2013   Procedure: LAPAROSCOPIC ROUX-EN-Y GASTRIC BYPASS WITH UPPER ENDOSCOPY;  Surgeon: Shann Medal, MD;  Location: [facility];  Service: General;  Laterality: N/A;  . HEMORRHOID BANDING  02/2013  . PUBOVAGINAL SLING  09/06/2011   Procedure: PUBO-VAGINAL SLING;  Surgeon: Ailene Rud, MD;  Location[facility];  Service: Urology;  Laterality: N/A;  . UVULOPALATOPHARYNGOPLASTY, TONSILLECTOMY AND SEPTOPLASTY  1994    There were no vitals filed for this visit.      Subjective Assessment - 01/09/17 1020    Subjective feeling well - no new complaints   Pertinent History gastric bypass   Diagnostic tests Xray - normal healing   Patient Stated Goals improve pain and function   Currently in Pain? Yes   Pain Score 3    Pain Location Shoulder   Pain Orientation Right   Pain Descriptors / Indicators Aching   Pain Type Acute pain                          [facility] Adult PT Treatment/Exercise - 01/09/17 0001      Shoulder Exercises: Standing   Other Standing Exercises PNF D1/D2 flexion/extension - red tband x 15 reps      Shoulder Exercises: ROM/Strengthening   UBE (Upper Arm Bike) L3.5 x 6 min (3/3)   Cybex Row 20 reps   Cybex Row Limitations 20#   Wall Pushups 15 reps   Pushups Limitations with orange pball against wall     Shoulder Exercises: Body Blade   Flexion 30 seconds;3 reps   External Rotation 30 seconds;3 reps     Modalities   Modalities Iontophoresis     Iontophoresis   Type of Iontophoresis Dexamethasone   Location R shoulder   Dose 1.0 mL   Time 4-6 hours; 71mA                  PT Short Term Goals - 12/05/16 1055      PT SHORT TERM GOAL #1   Title patient to be independent with initial HEP    Status On-going     PT SHORT TERM GOAL #2   Title patient to improve R shoulder PROM equal to that of L shoulder without pain limiting    Status On-going           PT Long Term Goals - 12/05/16 1055      PT LONG TERM GOAL #1   Title patient to be independent with advanced HEP   Status On-going     PT LONG TERM GOAL #2   Title patient to improve R shoulder AROM equal to that of L shoulder without pain limiting   Status On-going     PT LONG TERM GOAL #3   Title patient to improve gross R UE strength to >/= 4/5   Status On-going     PT LONG TERM GOAL #4   Title patient to report ability to perform ADLs and household tasks without pain limiting function   Status On-going     PT LONG TERM GOAL #5   Title patient to report ability to return to work at full capacity.    Status On-going               Plan - 01/09/17 1131    Clinical Impression Statement Patient conitnueing to make progress. Some difficulty with resisted motions overhead with PNF patterns, however tolerable. Ionto patch applied to R shoulder as patient does report some continued pain at R  shoulder. Will contiue to progress towards goals.    PT Treatment/Interventions ADLs/Self Care Home Management;Cryotherapy;Electrical Stimulation;Iontophoresis 4mg /ml Dexamethasone;Moist Heat;Ultrasound;Therapeutic exercise;Therapeutic activities;Patient/family education;Manual techniques;Passive range of motion;Vasopneumatic Device;Taping;Dry needling   Consulted and Agree with Plan  of Care Patient      Patient will benefit from skilled therapeutic intervention in order to improve the following deficits and impairments:  Decreased activity tolerance, Decreased range of motion, Decreased strength, Impaired UE functional use, Pain  Visit Diagnosis: Acute pain of right shoulder  Stiffness of right shoulder, not elsewhere classified  Abnormal posture  Muscle weakness (generalized)     Problem List Patient Active Problem List   Diagnosis Date Noted  . Cervical disc disorder with radiculopathy of cervical region 04/25/2016  . Nonallopathic lesion of cervical region 04/17/2016  . Nonallopathic lesion of thoracic region 04/17/2016  . Nonallopathic lesion of rib cage 04/17/2016  . Nonallopathic lesion of lumbosacral region 04/17/2016  . Degenerative cervical disc 03/29/2016  . Degenerative arthritis of right knee 03/29/2016  . Acute blood loss anemia   . UGIB (upper gastrointestinal bleed) 03/10/2016  . Upper GI bleed 03/10/2016  . 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 Abdominal pain, epigastric   . Adjustment disorder with depressed mood 07/14/2014  . Hx of colonic polyps   . 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 Chronic constipation 03/05/2013  . Coronary atherosclerosis of native coronary artery 08/27/2011  . History of uterine cancer 08/15/2011  . Hypercholesterolemia 08/15/2011  . History of colonic polyps 12/29/2009     Lanney Gins, PT, DPT 01/09/17 11:33 AM   [facility] [address]   [place], [place], [address] Phone: [phone]   Fax:  [phone]  Name: LAILEE HOELZEL MRN: [id number] Date of Birth: [birth year]

## 2017-01-13 ENCOUNTER — Ambulatory Visit: Payer: 59

## 2017-01-13 DIAGNOSIS — M6281 Muscle weakness (generalized): Secondary | ICD-10-CM | POA: Diagnosis not present

## 2017-01-13 DIAGNOSIS — R293 Abnormal posture: Secondary | ICD-10-CM | POA: Diagnosis not present

## 2017-01-13 DIAGNOSIS — M25511 Pain in right shoulder: Secondary | ICD-10-CM

## 2017-01-13 DIAGNOSIS — M25611 Stiffness of right shoulder, not elsewhere classified: Secondary | ICD-10-CM | POA: Diagnosis not present

## 2017-01-13 NOTE — Therapy (Signed)
[facility] [address]  [place], [place], [address] Phone: [phone]   Fax:  [phone]  Physical Therapy Treatment  Patient Details  Name: NICKOLA LENIG MRN: [id number] Date of Birth: [birth year] Referring Provider: Dr. Mardelle Matte  Encounter Date: 01/13/2017      PT End of Session - 01/13/17 1406    Visit Number 12   Number of Visits 12   Date for PT Re-Evaluation 01/13/17   Authorization Type [facility]   PT Start Time 1400   PT Stop Time 1455  moist heat to end tx   PT Time Calculation (min) 55 min   Activity Tolerance Patient tolerated treatment well   Behavior During Therapy [facility] for tasks assessed/performed      Past Medical History:  Diagnosis Date  . Anxiety   . Arthritis   . CAD (coronary artery disease) NON-OBSTRUCTIVE PER DR HOCHREIN NOTE 08-26-2011  . Cancer [facility]) 2006   uterine  . Complication of anesthesia PT STATES HAS LETTER FROM [place] [facility]. STATING THAT SHE HAS A VERY SMALL AIRWAY (LAST SURG. HYSTERECTOMY AT [facility] IN 2006   PT STATES LYNN BEASON CRNA WHOM WORKS HERE TOLD SHE WOULD BE OKAY TO BE DONE AT [facility]  . Diabetes mellitus    no meds taken now, checks cbg once or twice a week   . 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 GERD (gastroesophageal reflux disease)   . Heart murmur    with last visit, MD states "outgrown" murmur  . Hemorrhoids, internal, with bleeding and Grade 2 prolapse 03/05/2013   All positions seen an anoscopy RP columns banded 03/05/2013 LL and RA banded 03/18/2013     . History of endometrial cancer 2006 ---- S/P ABD. HYSTERECTOMY   STAGE I ENDOMETROID CARCINOMA  . Hx of adenomatous colonic polyps 2011   BENIGN  . Hyperlipidemia    no meds now  . Hypertension   . Morbid obesity, weight - 203, BMI - 38.4 05/12/2013  . OSA on CPAP    cpap setting of 16  . Sleep apnea    no CPAP since surgery    Past Surgical History:  Procedure Laterality Date  .  ABDOMINAL HYSTERECTOMY  12-20-2004   TAH  . BILATERAL BREAST REDUCTION  11-16-1999  . BREATH TEK H PYLORI N/A 05/27/2013   Procedure: BREATH TEK H PYLORI;  Surgeon: Shann Medal, MD;  [facility];  Service: General;  Laterality: N/A;  . [place]  . COLONOSCOPY    . COLONOSCOPY N/A 04/25/2014   Procedure: COLONOSCOPY;  Surgeon: Gatha Mayer, MD;  [facility];  Service: Endoscopy;  Laterality: N/A;  . CYSTOSCOPY  09/06/2011   Procedure: CYSTOSCOPY;  Surgeon: Ailene Rud, MD;  Location: [facility];  Service: Urology;  Laterality: N/A;  LYNX SLING   . ESOPHAGOGASTRODUODENOSCOPY (EGD) WITH PROPOFOL N/A 09/27/2014   Procedure: ESOPHAGOGASTRODUODENOSCOPY (EGD) WITH PROPOFOL;  Surgeon: Gatha Mayer, MD;  [facility];  Service: Endoscopy;  Laterality: N/A;  . ESOPHAGOGASTRODUODENOSCOPY (EGD) WITH PROPOFOL N/A 03/11/2016   Procedure: ESOPHAGOGASTRODUODENOSCOPY (EGD) WITH PROPOFOL;  Surgeon: Jerene Bears, MD;  [facility];  Service: Endoscopy;  Laterality: N/A;  . GASTRIC ROUX-EN-Y N/A 10/04/2013   Procedure: LAPAROSCOPIC ROUX-EN-Y GASTRIC BYPASS WITH UPPER ENDOSCOPY;  Surgeon: Shann Medal, MD;  [facility];  Service: General;  Laterality: N/A;  . HEMORRHOID BANDING  02/2013  . PUBOVAGINAL SLING  09/06/2011   Procedure: Gaynelle Arabian;  Surgeon: Ailene Rud, MD;  [facility];  Service: Urology;  Laterality: N/A;  . UVULOPALATOPHARYNGOPLASTY, TONSILLECTOMY AND SEPTOPLASTY  1994    There were no vitals filed for this visit.      Subjective Assessment - 01/13/17 1402    Subjective Pt. noting overhead reaching or reaching back still with pain. Had some soreness after last treatment which subsided next day.     Patient Stated Goals improve pain and function   Currently in Pain? Yes   Pain Score 2    Pain Location Shoulder   Pain Orientation Right   Pain Descriptors /  Indicators Aching   Pain Type Acute pain   Pain Onset More than a month ago   Pain Frequency Constant   Aggravating Factors  reaching overhead, reaching back   Pain Relieving Factors Tylenol    Multiple Pain Sites No            [facility] PT Assessment - 01/13/17 1416      ROM / Strength   AROM / PROM / Strength Strength     AROM   AROM Assessment Site Shoulder   Right/Left Shoulder Right;Left   Right Shoulder Flexion 151 Degrees   Right Shoulder ABduction 135 Degrees   Right Shoulder Internal Rotation --  FIR to iliac crest    Right Shoulder External Rotation --  FER to C3   Left Shoulder Flexion 155 Degrees   Left Shoulder ABduction 170 Degrees   Left Shoulder Internal Rotation --  FIR to T7   Left Shoulder External Rotation --  FER to T1     PROM   PROM Assessment Site Shoulder   Right/Left Shoulder Right   Right Shoulder Flexion 158 Degrees   Right Shoulder ABduction 155 Degrees   Right Shoulder Internal Rotation 70 Degrees   Right Shoulder External Rotation 53 Degrees     Strength   Strength Assessment Site Shoulder   Right/Left Shoulder Right;Left   Right Shoulder Flexion 4-/5   Right Shoulder ABduction 4-/5   Right Shoulder Internal Rotation 4-/5   Right Shoulder External Rotation 4-/5   Left Shoulder Flexion 4+/5   Left Shoulder ABduction 4+/5   Left Shoulder Internal Rotation 4+/5   Left Shoulder External Rotation 4+/5                     [facility] Adult PT Treatment/Exercise - 01/13/17 1410      Shoulder Exercises: Standing   Flexion 15 reps;Both   Flexion Limitations leaning on doorseal    Other Standing Exercises PNF D1/D2 flexion/extension - red tband x 15 reps    Other Standing Exercises R UE cabinet reaches - flexion (2#) and abduction (2#) x 20 reps       Shoulder Exercises: ROM/Strengthening   UBE (Upper Arm Bike) L3.5 x 6 min (3/3)     Moist Heat Therapy   Number Minutes Moist Heat 10 Minutes   Moist Heat Location Shoulder  R                    PT Short Term Goals - 01/13/17 1408      PT SHORT TERM GOAL #1   Title patient to be independent with initial HEP    Status Achieved     PT SHORT TERM GOAL #2   Title patient to improve R shoulder PROM equal to that of L shoulder without pain limiting  Status On-going           PT Long Term Goals - 01/13/17 1413      PT LONG TERM GOAL #1   Title patient to be independent with advanced HEP   Status On-going     PT LONG TERM GOAL #2   Title patient to improve R shoulder AROM equal to that of L shoulder without pain limiting   Status On-going     PT LONG TERM GOAL #3   Title patient to improve gross R UE strength to >/= 4/5   Status On-going     PT LONG TERM GOAL #4   Title patient to report ability to perform ADLs and household tasks without pain limiting function   Status On-going  still limited with turning steering wheel while driving and reaching into fridge      PT LONG TERM GOAL #5   Title patient to report ability to return to work at full capacity.    Status On-going               Plan - 01/13/17 1407    Clinical Impression Statement Pt. making good progress with therapy thus far with improved ROM and tolerance for overhead reaching.  LTG's still ongoing with R shoulder strength still grossly 4-/5 and still significant AROM deficits which will likely limit work related tasks.     PT Treatment/Interventions ADLs/Self Care Home Management;Cryotherapy;Electrical Stimulation;Iontophoresis 4mg /ml Dexamethasone;Moist Heat;Ultrasound;Therapeutic exercise;Therapeutic activities;Patient/family education;Manual techniques;Passive range of motion;Vasopneumatic Device;Taping;Dry needling      Patient will benefit from skilled therapeutic intervention in order to improve the following deficits and impairments:  Decreased activity tolerance, Decreased range of motion, Decreased strength, Impaired UE functional use, Pain  Visit  Diagnosis: Acute pain of right shoulder  Stiffness of right shoulder, not elsewhere classified  Abnormal posture  Muscle weakness (generalized)     Problem List Patient Active Problem List   Diagnosis Date Noted  . Cervical disc disorder with radiculopathy of cervical region 04/25/2016  . Nonallopathic lesion of cervical region 04/17/2016  . Nonallopathic lesion of thoracic region 04/17/2016  . Nonallopathic lesion of rib cage 04/17/2016  . Nonallopathic lesion of lumbosacral region 04/17/2016  . Degenerative cervical disc 03/29/2016  . Degenerative arthritis of right knee 03/29/2016  . Acute blood loss anemia   . UGIB (upper gastrointestinal bleed) 03/10/2016  . Upper GI bleed 03/10/2016  . 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 Abdominal pain, epigastric   . Adjustment disorder with depressed mood 07/14/2014  . Hx of colonic polyps   . 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 Chronic constipation 03/05/2013  . Coronary atherosclerosis of native coronary artery 08/27/2011  . History of uterine cancer 08/15/2011  . Hypercholesterolemia 08/15/2011  . History of colonic polyps 12/29/2009    Bess Harvest, PTA 01/13/17 6:22 PM  [facility] [address]  [place], [place], [address] Phone: [phone]   Fax:  [phone]  Name: NITISHA CIVELLO MRN: [id number] Date of Birth: 1961/03/26

## 2017-01-15 DIAGNOSIS — S42024D Nondisplaced fracture of shaft of right clavicle, subsequent encounter for fracture with routine healing: Secondary | ICD-10-CM | POA: Diagnosis not present

## 2017-01-21 ENCOUNTER — Ambulatory Visit: Payer: 59 | Admitting: Physical Therapy

## 2017-01-27 ENCOUNTER — Ambulatory Visit: Payer: 59 | Attending: Orthopedic Surgery

## 2017-01-27 DIAGNOSIS — M6281 Muscle weakness (generalized): Secondary | ICD-10-CM | POA: Diagnosis not present

## 2017-01-27 DIAGNOSIS — M25511 Pain in right shoulder: Secondary | ICD-10-CM

## 2017-01-27 DIAGNOSIS — M25611 Stiffness of right shoulder, not elsewhere classified: Secondary | ICD-10-CM | POA: Diagnosis not present

## 2017-01-27 DIAGNOSIS — R293 Abnormal posture: Secondary | ICD-10-CM

## 2017-01-27 NOTE — Therapy (Signed)
[facility] [address]  [place], [place], [address] Phone: [phone]   Fax:  [phone]  Physical Therapy Treatment  Patient Details  Name: Sandra [name] MRN: [id number] Date of Birth: [birth year] Referring Provider: Dr. Mardelle Matte  Encounter Date: 01/27/2017      PT End of Session - 01/27/17 0948    Visit Number 13   Number of Visits 20   Date for PT Re-Evaluation 02/24/17   Authorization Type [facility]   PT Start Time 0932   PT Stop Time 1025  10 min moist heat to end tx   PT Time Calculation (min) 53 min   Activity Tolerance Patient tolerated treatment well   Behavior During Therapy [facility] for tasks assessed/performed      Past Medical History:  Diagnosis Date  . Anxiety   . Arthritis   . CAD (coronary artery disease) NON-OBSTRUCTIVE PER DR HOCHREIN NOTE 08-26-2011  . Cancer [facility]) 2006   uterine  . Complication of anesthesia PT STATES HAS LETTER FROM [place] [facility]. STATING THAT SHE HAS A VERY SMALL AIRWAY (LAST SURG. HYSTERECTOMY AT [facility] IN 2006   PT STATES LYNN BEASON CRNA WHOM WORKS HERE TOLD SHE WOULD BE OKAY TO BE DONE AT [facility]  . Diabetes mellitus    no meds taken now, checks cbg once or twice a week   . 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 GERD (gastroesophageal reflux disease)   . Heart murmur    with last visit, MD states "outgrown" murmur  . Hemorrhoids, internal, with bleeding and Grade 2 prolapse 03/05/2013   All positions seen an anoscopy RP columns banded 03/05/2013 LL and RA banded 03/18/2013     . History of endometrial cancer 2006 ---- S/P ABD. HYSTERECTOMY   STAGE I ENDOMETROID CARCINOMA  . Hx of adenomatous colonic polyps 2011   BENIGN  . Hyperlipidemia    no meds now  . Hypertension   . Morbid obesity, weight - 203, BMI - 38.4 05/12/2013  . OSA on CPAP    cpap setting of 16  . Sleep apnea    no CPAP since surgery    Past Surgical History:  Procedure Laterality Date  .  ABDOMINAL HYSTERECTOMY  12-20-2004   TAH  . BILATERAL BREAST REDUCTION  11-16-1999  . BREATH TEK H PYLORI N/A 05/27/2013   Procedure: BREATH TEK H PYLORI;  Surgeon: Shann Medal, MD;  Location[facility];  Service: General;  Laterality: N/A;  . [place]  . COLONOSCOPY    . COLONOSCOPY N/A 04/25/2014   Procedure: COLONOSCOPY;  Surgeon: Gatha Mayer, MD;  Location: [facility];  Service: Endoscopy;  Laterality: N/A;  . CYSTOSCOPY  09/06/2011   Procedure: CYSTOSCOPY;  Surgeon: Ailene Rud, MD;  Location: [facility];  Service: Urology;  Laterality: N/A;  LYNX SLING   . ESOPHAGOGASTRODUODENOSCOPY (EGD) WITH PROPOFOL N/A 09/27/2014   Procedure: ESOPHAGOGASTRODUODENOSCOPY (EGD) WITH PROPOFOL;  Surgeon: Gatha Mayer, MD;  [facility];  Service: Endoscopy;  Laterality: N/A;  . ESOPHAGOGASTRODUODENOSCOPY (EGD) WITH PROPOFOL N/A 03/11/2016   Procedure: ESOPHAGOGASTRODUODENOSCOPY (EGD) WITH PROPOFOL;  Surgeon: Jerene Bears, MD;  [facility];  Service: Endoscopy;  Laterality: N/A;  . GASTRIC ROUX-EN-Y N/A 10/04/2013   Procedure: LAPAROSCOPIC ROUX-EN-Y GASTRIC BYPASS WITH UPPER ENDOSCOPY;  Surgeon: Shann Medal, MD;  [facility]: [facility];  Service: General;  Laterality: N/A;  . HEMORRHOID BANDING  02/2013  . PUBOVAGINAL  SLING  09/06/2011   Procedure: Gaynelle Arabian;  Surgeon: Ailene Rud, MD;  Location: [facility];  Service: Urology;  Laterality: N/A;  . UVULOPALATOPHARYNGOPLASTY, TONSILLECTOMY AND SEPTOPLASTY  1994    There were no vitals filed for this visit.      Subjective Assessment - 01/27/17 0934    Subjective Pt. reporting MD wants continued therapy until 10.17 at next scheduled f/u.  Feels she has had limited progress over last two weeks with shoulder status and pain levels.     Patient Stated Goals improve pain and function   Currently in Pain? Yes   Pain Score 2    Pain Location Shoulder   Pain  Orientation Right   Pain Descriptors / Indicators Aching   Pain Type Acute pain   Pain Onset More than a month ago   Pain Frequency Constant   Aggravating Factors  reaching, reaching back   Pain Relieving Factors Ice at nights    Multiple Pain Sites No            [facility] PT Assessment - 01/27/17 1226      Assessment   Next MD Visit 10.17.18     AROM   Overall AROM  --  still visibly limited in flexion, abduction pain end Range     PROM   Overall PROM  --  still visibly limited with PROM all directions                      [facility] Adult PT Treatment/Exercise - 01/27/17 0949      Shoulder Exercises: Supine   Other Supine Exercises Supine chest stretch laying on 1/2 foam with therapist overpressure x 1 min      Shoulder Exercises: Standing   Extension Both;10 reps   Theraband Level (Shoulder Extension) Level 3 (Green)   Row Both;15 reps;Theraband  5" hold    Row Limitations tc for full scap. squeeze    Other Standing Exercises PNF D1/D2 flexion/extension - red tband x 15 reps   cues required for full range    Other Standing Exercises R UE cabinet reaches - flexion (3#) and abduction (3#) x 15 reps       Shoulder Exercises: ROM/Strengthening   UBE (Upper Arm Bike) L3.5 x 6 min (3/3)     Shoulder Exercises: Stretch   Corner Stretch 2 reps;30 seconds   Corner Stretch Limitations low and mid at doorway   Other Shoulder Stretches R UT stretch 2 x 30 sec   Other Shoulder Stretches R shoulder external rotation stretch 2 x 30 sec      Moist Heat Therapy   Number Minutes Moist Heat 10 Minutes   Moist Heat Location Shoulder  R     Iontophoresis   Type of Iontophoresis Dexamethasone   Location R shoulder   Dose 1.0 mL   Time 4-6 hours; 9mA     Manual Therapy   Manual Therapy Soft tissue mobilization   Manual therapy comments L sidelying    Soft tissue mobilization STM to posterior/inferior shoulder over area of tenderness    Passive ROM PROM into all  planes with gentle stretch                  PT Education - 01/27/17 1228    Education provided Yes   Education Details shoulder D1/D2 flexion/extension with red TB (pt. has at home already)   [employer]) Educated Patient   Methods Explanation;Demonstration;Verbal cues;Handout   Comprehension  Verbalized understanding;Returned demonstration;Verbal cues required;Need further instruction          PT Short Term Goals - 01/13/17 1408      PT SHORT TERM GOAL #1   Title patient to be independent with initial HEP    Status Achieved     PT SHORT TERM GOAL #2   Title patient to improve R shoulder PROM equal to that of L shoulder without pain limiting    Status On-going           PT Long Term Goals - 01/13/17 1413      PT LONG TERM GOAL #1   Title patient to be independent with advanced HEP   Status On-going     PT LONG TERM GOAL #2   Title patient to improve R shoulder AROM equal to that of L shoulder without pain limiting   Status On-going     PT LONG TERM GOAL #3   Title patient to improve gross R UE strength to >/= 4/5   Status On-going     PT LONG TERM GOAL #4   Title patient to report ability to perform ADLs and household tasks without pain limiting function   Status On-going  still limited with turning steering wheel while driving and reaching into fridge      PT LONG TERM GOAL #5   Title patient to report ability to return to work at full capacity.    Status On-going               Plan - 01/27/17 0949    Clinical Impression Statement Pt. returning to therapy after two-week break due to scheduling issues.  Saw MD for f/u with MD wanting pt. to continue with therapy until next scheduled visit with him on 10.17.  Reports she has been feeling limited progress with shoulder over last two weeks.  Pt. PROM still visibly limited with pain at end ranges.  AROM into flexion, and abduction still limited with pain at end ranges with cabinet reach today.  HEP  updated to include diagonal strengthening patterns with red TB.  Some tenderness still in posterior/inferior shoulder with STM today.  Tenderness at superior shoulder following therex today thus ionto patch #2/6 applied to this area to decrease tenderness/post-exercise swelling.  Pt. will benefit from further skilled therapy to improve tolerance for home/work related tasks.    PT Treatment/Interventions ADLs/Self Care Home Management;Cryotherapy;Electrical Stimulation;Iontophoresis 4mg /ml Dexamethasone;Moist Heat;Ultrasound;Therapeutic exercise;Therapeutic activities;Patient/family education;Manual techniques;Passive range of motion;Vasopneumatic Device;Taping;Dry needling      Patient will benefit from skilled therapeutic intervention in order to improve the following deficits and impairments:  Decreased activity tolerance, Decreased range of motion, Decreased strength, Impaired UE functional use, Pain  Visit Diagnosis: Acute pain of right shoulder - Plan: PT plan of care cert/re-cert  Stiffness of right shoulder, not elsewhere classified - Plan: PT plan of care cert/re-cert  Abnormal posture - Plan: PT plan of care cert/re-cert  Muscle weakness (generalized) - Plan: PT plan of care cert/re-cert     Problem List Patient Active Problem List   Diagnosis Date Noted  . Cervical disc disorder with radiculopathy of cervical region 04/25/2016  . Nonallopathic lesion of cervical region 04/17/2016  . Nonallopathic lesion of thoracic region 04/17/2016  . Nonallopathic lesion of rib cage 04/17/2016  . Nonallopathic lesion of lumbosacral region 04/17/2016  . Degenerative cervical disc 03/29/2016  . Degenerative arthritis of right knee 03/29/2016  . Acute blood loss anemia   . UGIB (upper gastrointestinal bleed) 03/10/2016  .  Upper GI bleed 03/10/2016  . 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 Abdominal pain, epigastric   . Adjustment disorder with depressed mood 07/14/2014  . Hx of colonic polyps    . 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 Chronic constipation 03/05/2013  . Coronary atherosclerosis of native coronary artery 08/27/2011  . History of uterine cancer 08/15/2011  . Hypercholesterolemia 08/15/2011  . History of colonic polyps 12/29/2009    Bess Harvest, PTA 01/27/17 5:27 PM   Lanney Gins, PT, DPT 01/27/17 5:27 PM   [facility] [address]  [place], [place], [address] Phone: [phone]   Fax:  [phone]  Name: MITSUKO LUERA MRN: [id number] Date of Birth: 1960/09/01

## 2017-01-30 ENCOUNTER — Ambulatory Visit: Payer: 59

## 2017-01-30 DIAGNOSIS — M25611 Stiffness of right shoulder, not elsewhere classified: Secondary | ICD-10-CM | POA: Diagnosis not present

## 2017-01-30 DIAGNOSIS — R293 Abnormal posture: Secondary | ICD-10-CM

## 2017-01-30 DIAGNOSIS — M25511 Pain in right shoulder: Secondary | ICD-10-CM | POA: Diagnosis not present

## 2017-01-30 DIAGNOSIS — M6281 Muscle weakness (generalized): Secondary | ICD-10-CM | POA: Diagnosis not present

## 2017-01-30 NOTE — Therapy (Signed)
[facility] [address]  [place], [place], [address] Phone: [phone]   Fax:  [phone]  Physical Therapy Treatment  Patient Details  Name: Sandra [name] MRN: [id number] Date of Birth: 1960-11-01 Referring Provider: Dr. Mardelle Matte  Encounter Date: 01/30/2017      PT End of Session - 01/30/17 0851    Visit Number 14   Number of Visits 20   Date for PT Re-Evaluation 02/24/17   Authorization Type [facility]   PT Start Time 0849   PT Stop Time 0933   PT Time Calculation (min) 44 min   Activity Tolerance Patient tolerated treatment well   Behavior During Therapy [facility] for tasks assessed/performed      Past Medical History:  Diagnosis Date  . Anxiety   . Arthritis   . CAD (coronary artery disease) NON-OBSTRUCTIVE PER DR HOCHREIN NOTE 08-26-2011  . Cancer [facility]) 2006   uterine  . Complication of anesthesia PT STATES HAS LETTER FROM [place] [facility]. STATING THAT SHE HAS A VERY SMALL AIRWAY (LAST SURG. HYSTERECTOMY AT [facility] IN 2006   PT STATES LYNN BEASON CRNA WHOM WORKS HERE TOLD SHE WOULD BE OKAY TO BE DONE AT [facility]  . Diabetes mellitus    no meds taken now, checks cbg once or twice a week   . 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 GERD (gastroesophageal reflux disease)   . Heart murmur    with last visit, MD states "outgrown" murmur  . Hemorrhoids, internal, with bleeding and Grade 2 prolapse 03/05/2013   All positions seen an anoscopy RP columns banded 03/05/2013 LL and RA banded 03/18/2013     . History of endometrial cancer 2006 ---- S/P ABD. HYSTERECTOMY   STAGE I ENDOMETROID CARCINOMA  . Hx of adenomatous colonic polyps 2011   BENIGN  . Hyperlipidemia    no meds now  . Hypertension   . Morbid obesity, weight - 203, BMI - 38.4 05/12/2013  . OSA on CPAP    cpap setting of 16  . Sleep apnea    no CPAP since surgery    Past Surgical History:  Procedure Laterality Date  . ABDOMINAL HYSTERECTOMY   12-20-2004   TAH  . BILATERAL BREAST REDUCTION  11-16-1999  . BREATH TEK H PYLORI N/A 05/27/2013   Procedure: BREATH TEK H PYLORI;  Surgeon: Shann Medal, MD;  [facility];  Service: General;  Laterality: N/A;  . [place]  . COLONOSCOPY    . COLONOSCOPY N/A 04/25/2014   Procedure: COLONOSCOPY;  Surgeon: Gatha Mayer, MD;  [facility];  Service: Endoscopy;  Laterality: N/A;  . CYSTOSCOPY  09/06/2011   Procedure: CYSTOSCOPY;  Surgeon: Ailene Rud, MD;  Location: [facility];  Service: Urology;  Laterality: N/A;  LYNX SLING   . ESOPHAGOGASTRODUODENOSCOPY (EGD) WITH PROPOFOL N/A 09/27/2014   Procedure: ESOPHAGOGASTRODUODENOSCOPY (EGD) WITH PROPOFOL;  Surgeon: Gatha Mayer, MD;  [facility];  Service: Endoscopy;  Laterality: N/A;  . ESOPHAGOGASTRODUODENOSCOPY (EGD) WITH PROPOFOL N/A 03/11/2016   Procedure: ESOPHAGOGASTRODUODENOSCOPY (EGD) WITH PROPOFOL;  Surgeon: Jerene Bears, MD;  [facility];  Service: Endoscopy;  Laterality: N/A;  . GASTRIC ROUX-EN-Y N/A 10/04/2013   Procedure: LAPAROSCOPIC ROUX-EN-Y GASTRIC BYPASS WITH UPPER ENDOSCOPY;  Surgeon: Shann Medal, MD;  [facility];  Service: General;  Laterality: N/A;  . HEMORRHOID BANDING  02/2013  . PUBOVAGINAL SLING  09/06/2011   Procedure: PUBO-VAGINAL SLING;  Surgeon: Ailene Rud, MD;  Location: [facility];  Service: Urology;  Laterality: N/A;  . UVULOPALATOPHARYNGOPLASTY, TONSILLECTOMY AND SEPTOPLASTY  1994    There were no vitals filed for this visit.      Subjective Assessment - 01/30/17 0851    Subjective Pt. noting pain has been increased since last visit with "constant throbbing pain".  Notes good relief before onset of "throbbing" pain from iontophoresis patch.     Patient Stated Goals improve pain and function   Currently in Pain? Yes   Pain Score 3    Pain Location Shoulder   Pain Orientation Right   Pain  Descriptors / Indicators Aching;Throbbing   Pain Type Acute pain   Pain Onset More than a month ago   Pain Frequency Constant   Aggravating Factors  Reaching back, and reaching overhead   Pain Relieving Factors ice at night   Multiple Pain Sites No            [facility] PT Assessment - 01/30/17 0926      PROM   PROM Assessment Site Shoulder   Right/Left Shoulder Right   Right Shoulder Flexion 152 Degrees   Right Shoulder ABduction 150 Degrees   Right Shoulder Internal Rotation 70 Degrees   Right Shoulder External Rotation 70 Degrees                     [facility] Adult PT Treatment/Exercise - 01/30/17 0859      Shoulder Exercises: Standing   External Rotation Right;20 reps;Theraband   Theraband Level (Shoulder External Rotation) Level 2 (Red)   Internal Rotation 20 reps;Right;Strengthening;Theraband   Theraband Level (Shoulder Internal Rotation) Level 2 (Red)   Row Both;15 reps;Theraband   Theraband Level (Shoulder Row) Level 3 (Green)   Row Limitations at 45 dg abduction; cues for full scap. squeeze    Other Standing Exercises PNF D1/D2 extension - red tband x 15 reps; cues for slow pacing and full ROM      Shoulder Exercises: ROM/Strengthening   UBE (Upper Arm Bike) L3.5 x 6 min (3/3)   Wall Pushups 15 reps   Pushups Limitations with orange pball against wall  increased angle to increase intensity      Shoulder Exercises: Stretch   Wall Stretch - Flexion 5 reps;10 seconds     Iontophoresis   Type of Iontophoresis Dexamethasone   Location R lateral shoulder   Dose 1.0 mL   Time 4-6 hours; 55mA     Manual Therapy   Manual Therapy Soft tissue mobilization;Muscle Energy Technique   Manual therapy comments L sidelying    Soft tissue mobilization STM to posterior/inferior shoulder over area of tenderness    Passive ROM PROM into all planes with gentle stretch   Muscle Energy Technique contract relax stretching into flexion, abduction                   PT Short Term Goals - 01/13/17 1408      PT SHORT TERM GOAL #1   Title patient to be independent with initial HEP    Status Achieved     PT SHORT TERM GOAL #2   Title patient to improve R shoulder PROM equal to that of L shoulder without pain limiting    Status On-going           PT Long Term Goals - 01/13/17 1413      PT LONG TERM GOAL #1   Title patient to be independent  with advanced HEP   Status On-going     PT LONG TERM GOAL #2   Title patient to improve R shoulder AROM equal to that of L shoulder without pain limiting   Status On-going     PT LONG TERM GOAL #3   Title patient to improve gross R UE strength to >/= 4/5   Status On-going     PT LONG TERM GOAL #4   Title patient to report ability to perform ADLs and household tasks without pain limiting function   Status On-going  still limited with turning steering wheel while driving and reaching into fridge      PT LONG TERM GOAL #5   Title patient to report ability to return to work at full capacity.    Status On-going               Plan - 01/30/17 6387    Clinical Impression Statement Pt. noting she has had increased R shoulder pain/throbbing since last visit.  Notes she did have some relief from ionto patch day of last treatment thus treatment ending with ionto patch applied to lateral R shoulder over area of most tenderness.  Pt. still with pain limiting overhead reaching and reaching behind body throughout daily activities.  Manual stretching and contract/relax stretching with pt. to improve ROM today.  PROM measured showing good improvement in ER with other motions nearly unchanged.  Pt. noting no issues with updated HEP.  Tolerated all therex activities in treatment well today.   PT Treatment/Interventions ADLs/Self Care Home Management;Cryotherapy;Electrical Stimulation;Iontophoresis 4mg /ml Dexamethasone;Moist Heat;Ultrasound;Therapeutic exercise;Therapeutic activities;Patient/family education;Manual  techniques;Passive range of motion;Vasopneumatic Device;Taping;Dry needling      Patient will benefit from skilled therapeutic intervention in order to improve the following deficits and impairments:  Decreased activity tolerance, Decreased range of motion, Decreased strength, Impaired UE functional use, Pain  Visit Diagnosis: Acute pain of right shoulder  Stiffness of right shoulder, not elsewhere classified  Abnormal posture  Muscle weakness (generalized)     Problem List Patient Active Problem List   Diagnosis Date Noted  . Cervical disc disorder with radiculopathy of cervical region 04/25/2016  . Nonallopathic lesion of cervical region 04/17/2016  . Nonallopathic lesion of thoracic region 04/17/2016  . Nonallopathic lesion of rib cage 04/17/2016  . Nonallopathic lesion of lumbosacral region 04/17/2016  . Degenerative cervical disc 03/29/2016  . Degenerative arthritis of right knee 03/29/2016  . Acute blood loss anemia   . UGIB (upper gastrointestinal bleed) 03/10/2016  . Upper GI bleed 03/10/2016  . 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 Abdominal pain, epigastric   . Adjustment disorder with depressed mood 07/14/2014  . Hx of colonic polyps   . 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 Chronic constipation 03/05/2013  . Coronary atherosclerosis of native coronary artery 08/27/2011  . History of uterine cancer 08/15/2011  . Hypercholesterolemia 08/15/2011  . History of colonic polyps 12/29/2009    Bess Harvest, PTA 01/30/17 3:24 PM  [facility] [address]  [place], [place], [address] Phone: [phone]   Fax:  [phone]  Name: Sandra [name] MRN: [id number] Date of Birth: 1960/10/05

## 2017-02-03 ENCOUNTER — Ambulatory Visit: Payer: 59 | Admitting: Physical Therapy

## 2017-02-03 DIAGNOSIS — M25611 Stiffness of right shoulder, not elsewhere classified: Secondary | ICD-10-CM

## 2017-02-03 DIAGNOSIS — M25511 Pain in right shoulder: Secondary | ICD-10-CM

## 2017-02-03 DIAGNOSIS — R293 Abnormal posture: Secondary | ICD-10-CM | POA: Diagnosis not present

## 2017-02-03 DIAGNOSIS — M6281 Muscle weakness (generalized): Secondary | ICD-10-CM

## 2017-02-03 MED FILL — diazePAM 10 MG TABS: 10 | 1 days supply | Qty: 1 | Fill #0

## 2017-02-03 MED FILL — VENLAFAXINE HCL ER 37.5 MG: 37.5 | 90 days supply | Qty: 90 | Fill #1

## 2017-02-03 NOTE — Therapy (Signed)
[facility] [address]  [place], [place], [address] Phone: [phone]   Fax:  [phone]  Physical Therapy Treatment  Patient Details  Name: Sandra [name] MRN: [id number] Date of Birth: 16-Oct-1960 Referring Provider: Dr. Mardelle Matte  Encounter Date: 02/03/2017      PT End of Session - 02/03/17 0849    Visit Number 15   Number of Visits 20   Date for PT Re-Evaluation 02/24/17   Authorization Type [facility]   PT Start Time 0847   PT Stop Time 0930   PT Time Calculation (min) 43 min   Activity Tolerance Patient tolerated treatment well   Behavior During Therapy [facility] for tasks assessed/performed      Past Medical History:  Diagnosis Date  . Anxiety   . Arthritis   . CAD (coronary artery disease) NON-OBSTRUCTIVE PER DR HOCHREIN NOTE 08-26-2011  . Cancer [facility]) 2006   uterine  . Complication of anesthesia PT STATES HAS LETTER FROM [place] [facility]. STATING THAT SHE HAS A VERY SMALL AIRWAY (LAST SURG. HYSTERECTOMY AT [facility] [place] 2006   PT STATES LYNN BEASON CRNA WHOM WORKS HERE TOLD SHE WOULD BE OKAY TO BE DONE AT [facility]  . Diabetes mellitus    no meds taken now, checks cbg once or twice a week   . 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 GERD (gastroesophageal reflux disease)   . Heart murmur    with last visit, MD states "outgrown" murmur  . Hemorrhoids, internal, with bleeding and Grade 2 prolapse 03/05/2013   All positions seen an anoscopy RP columns banded 03/05/2013 LL and RA banded 03/18/2013     . History of endometrial cancer 2006 ---- S/P ABD. HYSTERECTOMY   STAGE I ENDOMETROID CARCINOMA  . Hx of adenomatous colonic polyps 2011   BENIGN  . Hyperlipidemia    no meds now  . Hypertension   . Morbid obesity, weight - 203, BMI - 38.4 05/12/2013  . OSA on CPAP    cpap setting of 16  . Sleep apnea    no CPAP since surgery    Past Surgical History:  Procedure Laterality Date  . ABDOMINAL HYSTERECTOMY   12-20-2004   TAH  . BILATERAL BREAST REDUCTION  11-16-1999  . BREATH TEK H PYLORI N/A 05/27/2013   Procedure: BREATH TEK H PYLORI;  Surgeon: Shann Medal, MD;  Location: [facility];  Service: General;  Laterality: N/A;  . [place]  . COLONOSCOPY    . COLONOSCOPY N/A 04/25/2014   Procedure: COLONOSCOPY;  Surgeon: Gatha Mayer, MD;  Location: [facility];  Service: Endoscopy;  Laterality: N/A;  . CYSTOSCOPY  09/06/2011   Procedure: CYSTOSCOPY;  Surgeon: Ailene Rud, MD;  Location[facility];  Service: Urology;  Laterality: N/A;  LYNX SLING   . ESOPHAGOGASTRODUODENOSCOPY (EGD) WITH PROPOFOL N/A 09/27/2014   Procedure: ESOPHAGOGASTRODUODENOSCOPY (EGD) WITH PROPOFOL;  Surgeon: Gatha Mayer, MD;  Location: [facility];  Service: Endoscopy;  Laterality: N/A;  . ESOPHAGOGASTRODUODENOSCOPY (EGD) WITH PROPOFOL N/A 03/11/2016   Procedure: ESOPHAGOGASTRODUODENOSCOPY (EGD) WITH PROPOFOL;  Surgeon: Jerene Bears, MD;  Location: [facility];  Service: Endoscopy;  Laterality: N/A;  . GASTRIC ROUX-EN-Y N/A 10/04/2013   Procedure: LAPAROSCOPIC ROUX-EN-Y GASTRIC BYPASS WITH UPPER ENDOSCOPY;  Surgeon: Shann Medal, MD;  Location: [facility];  Service: General;  Laterality: N/A;  . HEMORRHOID BANDING  02/2013  . PUBOVAGINAL SLING  09/06/2011   Procedure: PUBO-VAGINAL SLING;  Surgeon: Ailene Rud, MD;  Location: [facility];  Service: Urology;  Laterality: N/A;  . UVULOPALATOPHARYNGOPLASTY, TONSILLECTOMY AND SEPTOPLASTY  1994    There were no vitals filed for this visit.      Subjective Assessment - 02/03/17 0849    Subjective Some pain with cross body exercises   Pertinent History gastric bypass   Diagnostic tests Xray - normal healing   Patient Stated Goals improve pain and function   Currently in Pain? Yes   Pain Score 4    Pain Location Shoulder   Pain Orientation Right   Pain Descriptors / Indicators Aching;Throbbing   Pain Type  Acute pain                         [facility] Adult PT Treatment/Exercise - 02/03/17 0850      Shoulder Exercises: Supine   Other Supine Exercises serratus punch - 4# R UE only x 20 reps     Shoulder Exercises: ROM/Strengthening   UBE (Upper Arm Bike) L3 x 6 min (3/3)     Shoulder Exercises: Stretch   Other Shoulder Stretches R cross body stretch 3 x 30 sec     Manual Therapy   Manual Therapy Soft tissue mobilization;Myofascial release   Manual therapy comments patient supine   Soft tissue mobilization STM to R lateral shoulder, UT, pecs   Myofascial Release manual trigger point release to mid deltoid          Trigger Point Dry Needling - 02/03/17 1149    Consent Given? Yes   Muscles Treated Upper Body --  Deltoid (all regions) -  twistch response                PT Short Term Goals - 01/13/17 1408      PT SHORT TERM GOAL #1   Title patient to be independent with initial HEP    Status Achieved     PT SHORT TERM GOAL #2   Title patient to improve R shoulder PROM equal to that of L shoulder without pain limiting    Status On-going           PT Long Term Goals - 01/13/17 1413      PT LONG TERM GOAL #1   Title patient to be independent with advanced HEP   Status On-going     PT LONG TERM GOAL #2   Title patient to improve R shoulder AROM equal to that of L shoulder without pain limiting   Status On-going     PT LONG TERM GOAL #3   Title patient to improve gross R UE strength to >/= 4/5   Status On-going     PT LONG TERM GOAL #4   Title patient to report ability to perform ADLs and household tasks without pain limiting function   Status On-going  still limited with turning steering wheel while driving and reaching into fridge      PT LONG TERM GOAL #5   Title patient to report ability to return to work at full capacity.    Status On-going               Plan - 02/03/17 0850    Clinical Impression Statement Patient doing well  today - only noting conitnued L lateral shoulder pain that she feels is most limiting to all functional mobility. Patient noting great benefit from DN, thus continued today with good twitch response. Will continue to  progress as patient tolerates.    PT Treatment/Interventions ADLs/Self Care Home Management;Cryotherapy;Electrical Stimulation;Iontophoresis 4mg /ml Dexamethasone;Moist Heat;Ultrasound;Therapeutic exercise;Therapeutic activities;Patient/family education;Manual techniques;Passive range of motion;Vasopneumatic Device;Taping;Dry needling   Consulted and Agree with Plan of Care Patient      Patient will benefit from skilled therapeutic intervention in order to improve the following deficits and impairments:  Decreased activity tolerance, Decreased range of motion, Decreased strength, Impaired UE functional use, Pain  Visit Diagnosis: Acute pain of right shoulder  Stiffness of right shoulder, not elsewhere classified  Abnormal posture  Muscle weakness (generalized)     Problem List Patient Active Problem List   Diagnosis Date Noted  . Cervical disc disorder with radiculopathy of cervical region 04/25/2016  . Nonallopathic lesion of cervical region 04/17/2016  . Nonallopathic lesion of thoracic region 04/17/2016  . Nonallopathic lesion of rib cage 04/17/2016  . Nonallopathic lesion of lumbosacral region 04/17/2016  . Degenerative cervical disc 03/29/2016  . Degenerative arthritis of right knee 03/29/2016  . Acute blood loss anemia   . UGIB (upper gastrointestinal bleed) 03/10/2016  . Upper GI bleed 03/10/2016  . 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 Abdominal pain, epigastric   . Adjustment disorder with depressed mood 07/14/2014  . Hx of colonic polyps   . 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 Chronic constipation 03/05/2013  . Coronary atherosclerosis of native coronary artery 08/27/2011  . History of uterine cancer 08/15/2011  . Hypercholesterolemia  08/15/2011  . History of colonic polyps 12/29/2009     Lanney Gins, PT, DPT 02/03/17 11:55 AM   [facility] [address]  [place], [place], [address] Phone: [phone]   Fax:  [phone]  Name: AMBRIELLE KINGTON MRN: [id number] Date of Birth: Nov 13, 1960

## 2017-02-04 MED FILL — AMOXICILLIN 875 MG TABLET: 875 | 10 days supply | Qty: 20 | Fill #0

## 2017-02-06 ENCOUNTER — Ambulatory Visit: Payer: 59

## 2017-02-10 ENCOUNTER — Ambulatory Visit: Payer: 59 | Admitting: Physical Therapy

## 2017-02-12 ENCOUNTER — Other Ambulatory Visit: Payer: Self-pay

## 2017-02-12 DIAGNOSIS — S42024D Nondisplaced fracture of shaft of right clavicle, subsequent encounter for fracture with routine healing: Secondary | ICD-10-CM | POA: Diagnosis not present

## 2017-02-12 MED ORDER — ESOMEPRAZOLE MAGNESIUM 40 MG PO CPDR: 40.0000 mg | DELAYED_RELEASE_CAPSULE | Freq: Two times a day (BID) | ORAL | 3 refills | Status: DC

## 2017-02-12 MED FILL — ESOMEPRAZOLE MAG DR 40 MG C: 40 | 90 days supply | Qty: 180 | Fill #0

## 2017-02-12 NOTE — Telephone Encounter (Signed)
Patient requested refills on her Nexium, sent to [facility].

## 2017-02-13 ENCOUNTER — Ambulatory Visit: Payer: 59

## 2017-02-13 DIAGNOSIS — M6281 Muscle weakness (generalized): Secondary | ICD-10-CM | POA: Diagnosis not present

## 2017-02-13 DIAGNOSIS — M25611 Stiffness of right shoulder, not elsewhere classified: Secondary | ICD-10-CM

## 2017-02-13 DIAGNOSIS — R293 Abnormal posture: Secondary | ICD-10-CM | POA: Diagnosis not present

## 2017-02-13 DIAGNOSIS — M25511 Pain in right shoulder: Secondary | ICD-10-CM

## 2017-02-13 NOTE — Therapy (Signed)
[facility] [address]  [place], [place], [address] Phone: [phone]   Fax:  [phone]  Physical Therapy Treatment  Patient Details  Name: Sandra [name] MRN: [id number] Date of Birth: 1961-04-24 Referring Provider: Dr. Mardelle Matte  Encounter Date: 02/13/2017      PT End of Session - 02/13/17 0909    Visit Number 16   Number of Visits 20   Date for PT Re-Evaluation 02/24/17   Authorization Type [facility]   PT Start Time 0849   PT Stop Time 0955  15 min heat to end treatment   PT Time Calculation (min) 66 min   Activity Tolerance Patient tolerated treatment well   Behavior During Therapy [facility] for tasks assessed/performed      Past Medical History:  Diagnosis Date  . Anxiety   . Arthritis   . CAD (coronary artery disease) NON-OBSTRUCTIVE PER DR HOCHREIN NOTE 08-26-2011  . Cancer [facility]) 2006   uterine  . Complication of anesthesia PT STATES HAS LETTER FROM [place] [facility]. STATING THAT SHE HAS A VERY SMALL AIRWAY (LAST SURG. HYSTERECTOMY AT [facility] IN 2006   PT STATES LYNN BEASON CRNA WHOM WORKS HERE TOLD SHE WOULD BE OKAY TO BE DONE AT [facility]  . Diabetes mellitus    no meds taken now, checks cbg once or twice a week   . 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 GERD (gastroesophageal reflux disease)   . Heart murmur    with last visit, MD states "outgrown" murmur  . Hemorrhoids, internal, with bleeding and Grade 2 prolapse 03/05/2013   All positions seen an anoscopy RP columns banded 03/05/2013 LL and RA banded 03/18/2013     . History of endometrial cancer 2006 ---- S/P ABD. HYSTERECTOMY   STAGE I ENDOMETROID CARCINOMA  . Hx of adenomatous colonic polyps 2011   BENIGN  . Hyperlipidemia    no meds now  . Hypertension   . Morbid obesity, weight - 203, BMI - 38.4 05/12/2013  . OSA on CPAP    cpap setting of 16  . Sleep apnea    no CPAP since surgery    Past Surgical History:  Procedure Laterality Date  .  ABDOMINAL HYSTERECTOMY  12-20-2004   TAH  . BILATERAL BREAST REDUCTION  11-16-1999  . BREATH TEK H PYLORI N/A 05/27/2013   Procedure: BREATH TEK H PYLORI;  Surgeon: Shann Medal, MD;  Location[facility];  Service: General;  Laterality: N/A;  . [place]  . COLONOSCOPY    . COLONOSCOPY N/A 04/25/2014   Procedure: COLONOSCOPY;  Surgeon: Gatha Mayer, MD;  [facility];  Service: Endoscopy;  Laterality: N/A;  . CYSTOSCOPY  09/06/2011   Procedure: CYSTOSCOPY;  Surgeon: Ailene Rud, MD;  Location: [facility];  Service: Urology;  Laterality: N/A;  LYNX SLING   . ESOPHAGOGASTRODUODENOSCOPY (EGD) WITH PROPOFOL N/A 09/27/2014   Procedure: ESOPHAGOGASTRODUODENOSCOPY (EGD) WITH PROPOFOL;  Surgeon: Gatha Mayer, MD;  [facility];  Service: Endoscopy;  Laterality: N/A;  . ESOPHAGOGASTRODUODENOSCOPY (EGD) WITH PROPOFOL N/A 03/11/2016   Procedure: ESOPHAGOGASTRODUODENOSCOPY (EGD) WITH PROPOFOL;  Surgeon: Jerene Bears, MD;  Location[facility];  Service: Endoscopy;  Laterality: N/A;  . GASTRIC ROUX-EN-Y N/A 10/04/2013   Procedure: LAPAROSCOPIC ROUX-EN-Y GASTRIC BYPASS WITH UPPER ENDOSCOPY;  Surgeon: Shann Medal, MD;  [facility];  Service: General;  Laterality: N/A;  . HEMORRHOID BANDING  02/2013  . PUBOVAGINAL SLING  09/06/2011   Procedure: Gaynelle Arabian;  Surgeon: Ailene Rud, MD;  Location: [facility];  Service: Urology;  Laterality: N/A;  . UVULOPALATOPHARYNGOPLASTY, TONSILLECTOMY AND SEPTOPLASTY  1994    There were no vitals filed for this visit.      Subjective Assessment - 02/13/17 0852    Subjective Pt. reporting fall down back steps landing on ribs and R shoulder.  Saw MD for f/u yesterday with x-rays looking good.     Patient Stated Goals improve pain and function   Currently in Pain? No/denies   Pain Score 0-No pain   Multiple Pain Sites No                          [facility] Adult PT Treatment/Exercise - 02/13/17 0910      Self-Care   Self-Care Other Self-Care Comments   Other Self-Care Comments  self massage with ball on wall for posterior inferior shoulder over area of tenderness      Shoulder Exercises: Standing   External Rotation Both;15 reps;Theraband  3" hold    Theraband Level (Shoulder External Rotation) Level 1 (Yellow)   External Rotation Limitations elbows at 45dg   cues for full range, tc for scap. squeeze    Other Standing Exercises PNF D1/D2 flexion/extension - red tband x 15 reps; cues for slow pacing and full ROM    Other Standing Exercises R UE cabinet reaches - flexion (3#), abduction (3#) x 45 sec each      Shoulder Exercises: ROM/Strengthening   UBE (Upper Arm Bike) L3.5 x 6 min (3/3)   Wall Pushups 15 reps   Pushups Limitations with orange pball against wall     Shoulder Exercises: Stretch   Corner Stretch 2 reps;30 seconds   Corner Stretch Limitations mid to tolerance    Other Shoulder Stretches R cross body stretch 3 x 30 sec   Other Shoulder Stretches R IR stretch with golf club 3" x 10 reps      Modalities   Modalities Iontophoresis     Moist Heat Therapy   Number Minutes Moist Heat 15 Minutes   Moist Heat Location Shoulder     Iontophoresis   Type of Iontophoresis Dexamethasone   Location R lateral shoulder   Dose 1.0 mL   Time 4-6 hours; 59mA     Manual Therapy   Manual Therapy Soft tissue mobilization;Myofascial release;Taping   Manual therapy comments L sidelying    Soft tissue mobilization STM to R lateral shoulder, posterior/inferior shoulder    Myofascial Release Manual TPR to lats                PT Education - 02/13/17 1004    Education provided Yes   Education Details wall pushup (pt. has p-ball at home for this), posterior shoulder stretch, towel stretch for IR, ball massage on wall for posterior/inferior shoulder   Person(s) Educated Patient   Methods Explanation;Demonstration;Verbal  cues;Handout   Comprehension Verbalized understanding;Returned demonstration;Verbal cues required;Need further instruction          PT Short Term Goals - 01/13/17 1408      PT SHORT TERM GOAL #1   Title patient to be independent with initial HEP    Status Achieved     PT SHORT TERM GOAL #2   Title patient to improve R shoulder PROM equal to that of L shoulder without pain limiting    Status On-going  PT Long Term Goals - 01/13/17 1413      PT LONG TERM GOAL #1   Title patient to be independent with advanced HEP   Status On-going     PT LONG TERM GOAL #2   Title patient to improve R shoulder AROM equal to that of L shoulder without pain limiting   Status On-going     PT LONG TERM GOAL #3   Title patient to improve gross R UE strength to >/= 4/5   Status On-going     PT LONG TERM GOAL #4   Title patient to report ability to perform ADLs and household tasks without pain limiting function   Status On-going  still limited with turning steering wheel while driving and reaching into fridge      PT LONG TERM GOAL #5   Title patient to report ability to return to work at full capacity.    Status On-going               Plan - 02/13/17 1005    Clinical Impression Statement Sandra [name] seen to start treatment with referral for work conditioning from MD f/u.  Improved tolerance today for functional strengthening with band simulating work task motions.  Still TTP in posterior/inferior shoulder thus reviewed ball release for this area on wall.  HEP updated with stretches and ball release for improved ROM.  Ionto patch continued today as pt. noting relief with this.  Will progress as pt. able in coming visits.     PT Treatment/Interventions ADLs/Self Care Home Management;Cryotherapy;Electrical Stimulation;Iontophoresis 4mg /ml Dexamethasone;Moist Heat;Ultrasound;Therapeutic exercise;Therapeutic activities;Patient/family education;Manual techniques;Passive range of  motion;Vasopneumatic Device;Taping;Dry needling      Patient will benefit from skilled therapeutic intervention in order to improve the following deficits and impairments:  Decreased activity tolerance, Decreased range of motion, Decreased strength, Impaired UE functional use, Pain  Visit Diagnosis: Acute pain of right shoulder  Stiffness of right shoulder, not elsewhere classified  Abnormal posture  Muscle weakness (generalized)     Problem List Patient Active Problem List   Diagnosis Date Noted  . Cervical disc disorder with radiculopathy of cervical region 04/25/2016  . Nonallopathic lesion of cervical region 04/17/2016  . Nonallopathic lesion of thoracic region 04/17/2016  . Nonallopathic lesion of rib cage 04/17/2016  . Nonallopathic lesion of lumbosacral region 04/17/2016  . Degenerative cervical disc 03/29/2016  . Degenerative arthritis of right knee 03/29/2016  . Acute blood loss anemia   . UGIB (upper gastrointestinal bleed) 03/10/2016  . Upper GI bleed 03/10/2016  . 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 Abdominal pain, epigastric   . Adjustment disorder with depressed mood 07/14/2014  . Hx of colonic polyps   . 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 Chronic constipation 03/05/2013  . Coronary atherosclerosis of native coronary artery 08/27/2011  . History of uterine cancer 08/15/2011  . Hypercholesterolemia 08/15/2011  . History of colonic polyps 12/29/2009    Bess Harvest, PTA 02/13/17 10:10 AM  [facility] [address]  [place], [place], [address] Phone: [phone]   Fax:  [phone]  Name: Sandra [name] MRN: [id number] Date of Birth: [birth year]

## 2017-02-18 ENCOUNTER — Ambulatory Visit: Payer: 59

## 2017-02-18 DIAGNOSIS — M25511 Pain in right shoulder: Secondary | ICD-10-CM

## 2017-02-18 DIAGNOSIS — R293 Abnormal posture: Secondary | ICD-10-CM | POA: Diagnosis not present

## 2017-02-18 DIAGNOSIS — M25611 Stiffness of right shoulder, not elsewhere classified: Secondary | ICD-10-CM | POA: Diagnosis not present

## 2017-02-18 DIAGNOSIS — M6281 Muscle weakness (generalized): Secondary | ICD-10-CM | POA: Diagnosis not present

## 2017-02-18 NOTE — Therapy (Signed)
[facility] [address]  [place], [place], [address] Phone: [phone]   Fax:  [phone]  Physical Therapy Treatment  Patient Details  Name: Sandra [name] MRN: [id number] Date of Birth: 19-Apr-1961 Referring Provider: Dr. Mardelle Matte  Encounter Date: 02/18/2017      PT End of Session - 02/18/17 1033    Visit Number 17   Number of Visits 20   Date for PT Re-Evaluation 02/24/17   Authorization Type [facility]   PT Start Time 1017   PT Stop Time 1059   PT Time Calculation (min) 42 min   Activity Tolerance Patient tolerated treatment well   Behavior During Therapy [facility] for tasks assessed/performed      Past Medical History:  Diagnosis Date  . Anxiety   . Arthritis   . CAD (coronary artery disease) NON-OBSTRUCTIVE PER DR HOCHREIN NOTE 08-26-2011  . Cancer [facility]) 2006   uterine  . Complication of anesthesia PT STATES HAS LETTER FROM [place] [facility]. STATING THAT SHE HAS A VERY SMALL AIRWAY (LAST SURG. HYSTERECTOMY AT [facility] IN 2006   PT STATES LYNN BEASON CRNA WHOM WORKS HERE TOLD SHE WOULD BE OKAY TO BE DONE AT [facility]  . Diabetes mellitus    no meds taken now, checks cbg once or twice a week   . 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 GERD (gastroesophageal reflux disease)   . Heart murmur    with last visit, MD states "outgrown" murmur  . Hemorrhoids, internal, with bleeding and Grade 2 prolapse 03/05/2013   All positions seen an anoscopy RP columns banded 03/05/2013 LL and RA banded 03/18/2013     . History of endometrial cancer 2006 ---- S/P ABD. HYSTERECTOMY   STAGE I ENDOMETROID CARCINOMA  . Hx of adenomatous colonic polyps 2011   BENIGN  . Hyperlipidemia    no meds now  . Hypertension   . Morbid obesity, weight - 203, BMI - 38.4 05/12/2013  . OSA on CPAP    cpap setting of 16  . Sleep apnea    no CPAP since surgery    Past Surgical History:  Procedure Laterality Date  . ABDOMINAL HYSTERECTOMY   12-20-2004   TAH  . BILATERAL BREAST REDUCTION  11-16-1999  . BREATH TEK H PYLORI N/A 05/27/2013   Procedure: BREATH TEK H PYLORI;  Surgeon: Shann Medal, MD;  Location: [facility];  Service: General;  Laterality: N/A;  . [place]  . COLONOSCOPY    . COLONOSCOPY N/A 04/25/2014   Procedure: COLONOSCOPY;  Surgeon: Gatha Mayer, MD;  Location: [facility];  Service: Endoscopy;  Laterality: N/A;  . CYSTOSCOPY  09/06/2011   Procedure: CYSTOSCOPY;  Surgeon: Ailene Rud, MD;  Location: [facility];  Service: Urology;  Laterality: N/A;  LYNX SLING   . ESOPHAGOGASTRODUODENOSCOPY (EGD) WITH PROPOFOL N/A 09/27/2014   Procedure: ESOPHAGOGASTRODUODENOSCOPY (EGD) WITH PROPOFOL;  Surgeon: Gatha Mayer, MD;  Location: [facility];  Service: Endoscopy;  Laterality: N/A;  . ESOPHAGOGASTRODUODENOSCOPY (EGD) WITH PROPOFOL N/A 03/11/2016   Procedure: ESOPHAGOGASTRODUODENOSCOPY (EGD) WITH PROPOFOL;  Surgeon: Jerene Bears, MD;  Location: [facility];  Service: Endoscopy;  Laterality: N/A;  . GASTRIC ROUX-EN-Y N/A 10/04/2013   Procedure: LAPAROSCOPIC ROUX-EN-Y GASTRIC BYPASS WITH UPPER ENDOSCOPY;  Surgeon: Shann Medal, MD;  Location: [facility];  Service: General;  Laterality: N/A;  . HEMORRHOID BANDING  02/2013  . PUBOVAGINAL SLING  09/06/2011   Procedure: PUBO-VAGINAL SLING;  Surgeon: Ailene Rud, MD;  Location: [facility];  Service: Urology;  Laterality: N/A;  . UVULOPALATOPHARYNGOPLASTY, TONSILLECTOMY AND SEPTOPLASTY  1994    There were no vitals filed for this visit.      Subjective Assessment - 02/18/17 1019    Subjective Pt. reporting shoulder has been feeling "achy" over weekend which she attributes to cold weather.     Patient Stated Goals improve pain and function   Currently in Pain? Yes   Pain Score 2    Pain Location Shoulder   Pain Orientation Right   Pain Descriptors / Indicators Sore   Pain Type Acute pain   Pain Onset  More than a month ago   Pain Frequency Constant   Aggravating Factors  Reaching back, reaching overhead                          [facility] Adult PT Treatment/Exercise - 02/18/17 1023      Self-Care   Self-Care Other Self-Care Comments   Other Self-Care Comments  sled push/pull 2 x 25 ft each way; to simulate pt. handling with work related tasks      Shoulder Exercises: Supine   Protraction Right;20 reps;Weights   Protraction Weight (lbs) 5     Shoulder Exercises: Standing   Row Both;15 reps;Theraband   Row Limitations TRX row    Other Standing Exercises R UE cabinet reaches - flexion (3#), abduction (3#) x 1 min each      Shoulder Exercises: ROM/Strengthening   UBE (Upper Arm Bike) L3.5 x 6 min (3/3)   Wall Pushups 15 reps   Pushups Limitations with orange pball against wall     Shoulder Exercises: Stretch   Corner Stretch 2 reps;30 seconds   Corner Stretch Limitations mid and high to tolerance    Internal Rotation Stretch 30 seconds   Internal Rotation Stretch Limitations sleeper stretch    Other Shoulder Stretches R cross body stretch 3 x 30 sec   Other Shoulder Stretches R IR stretch with golf club 3" x 10 reps      Manual Therapy   Manual Therapy Soft tissue mobilization;Myofascial release;Taping   Manual therapy comments L sidelying    Soft tissue mobilization STM to R lateral shoulder, posterior/inferior shoulder    Myofascial Release Manual TPR to lats   Passive ROM PROM into all planes with gentle stretch   Muscle [employer] Technique contract relax stretching into flexion, abduction   [employer] banker, 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 over area of tenderness (30% stretch)                   PT Short Term Goals - 01/13/17 1408      PT SHORT TERM GOAL #1   Title patient to be independent with initial HEP    Status Achieved     PT SHORT TERM GOAL #2   Title patient to improve R shoulder PROM equal to that of L  shoulder without pain limiting    Status On-going           PT Long Term Goals - 01/13/17 1413      PT LONG TERM GOAL #1   Title patient to be independent with advanced HEP   Status On-going     PT LONG TERM GOAL #2   Title patient to improve R shoulder AROM equal to that of L shoulder without  pain limiting   Status On-going     PT LONG TERM GOAL #3   Title patient to improve gross R UE strength to >/= 4/5   Status On-going     PT LONG TERM GOAL #4   Title patient to report ability to perform ADLs and household tasks without pain limiting function   Status On-going  still limited with turning steering wheel while driving and reaching into fridge      PT LONG TERM GOAL #5   Title patient to report ability to return to work at full capacity.    Status On-going               Plan - 02/18/17 1239    Clinical Impression Statement Avyn reporting some benefit from taping applied to ribs last visit thus re-applied tape to this area today.  Tolerated all strengthening therex well today without issue.  Has been performing updated HEP without issue.  Sled push/pull today to simulate demands with work related pt. handling.  Will continue to progress as pt. able in coming visits.     PT Treatment/Interventions ADLs/Self Care Home Management;Cryotherapy;Electrical Stimulation;Iontophoresis 4mg /ml Dexamethasone;Moist Heat;Ultrasound;Therapeutic exercise;Therapeutic activities;Patient/family education;Manual techniques;Passive range of motion;Vasopneumatic Device;Taping;Dry needling      Patient will benefit from skilled therapeutic intervention in order to improve the following deficits and impairments:  Decreased activity tolerance, Decreased range of motion, Decreased strength, Impaired UE functional use, Pain  Visit Diagnosis: Acute pain of right shoulder  Stiffness of right shoulder, not elsewhere classified  Abnormal posture  Muscle weakness  (generalized)     Problem List Patient Active Problem List   Diagnosis Date Noted  . Cervical disc disorder with radiculopathy of cervical region 04/25/2016  . Nonallopathic lesion of cervical region 04/17/2016  . Nonallopathic lesion of thoracic region 04/17/2016  . Nonallopathic lesion of rib cage 04/17/2016  . Nonallopathic lesion of lumbosacral region 04/17/2016  . Degenerative cervical disc 03/29/2016  . Degenerative arthritis of right knee 03/29/2016  . Acute blood loss anemia   . UGIB (upper gastrointestinal bleed) 03/10/2016  . Upper GI bleed 03/10/2016  . 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 Abdominal pain, epigastric   . Adjustment disorder with depressed mood 07/14/2014  . Hx of colonic polyps   . 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 Chronic constipation 03/05/2013  . Coronary atherosclerosis of native coronary artery 08/27/2011  . History of uterine cancer 08/15/2011  . Hypercholesterolemia 08/15/2011  . History of colonic polyps 12/29/2009    Bess Harvest, PTA 02/18/17 12:44 PM  [facility] [address]  [place], [place], [address] Phone: [phone]   Fax:  [phone]  Name: Sandra [name] MRN: [id number] Date of Birth: 1960-08-10

## 2017-02-20 ENCOUNTER — Ambulatory Visit: Payer: 59

## 2017-02-20 DIAGNOSIS — R293 Abnormal posture: Secondary | ICD-10-CM

## 2017-02-20 DIAGNOSIS — M25511 Pain in right shoulder: Secondary | ICD-10-CM | POA: Diagnosis not present

## 2017-02-20 DIAGNOSIS — M6281 Muscle weakness (generalized): Secondary | ICD-10-CM | POA: Diagnosis not present

## 2017-02-20 DIAGNOSIS — M25611 Stiffness of right shoulder, not elsewhere classified: Secondary | ICD-10-CM

## 2017-02-20 NOTE — Therapy (Signed)
[facility] [address]  [place], [place], [address] Phone: [phone]   Fax:  [phone]  Physical Therapy Treatment  Patient Details  Name: Sandra [name] MRN: [id number] Date of Birth: [birth year] Referring Provider: Dr. Mardelle Matte  Encounter Date: 02/20/2017      PT End of Session - 02/20/17 0950    Visit Number 18   Number of Visits 20   Date for PT Re-Evaluation 02/24/17   Authorization Type [facility]   PT Start Time 0933   PT Stop Time 1013   PT Time Calculation (min) 40 min   Activity Tolerance Patient tolerated treatment well   Behavior During Therapy [facility] for tasks assessed/performed      Past Medical History:  Diagnosis Date  . Anxiety   . Arthritis   . CAD (coronary artery disease) NON-OBSTRUCTIVE PER DR HOCHREIN NOTE 08-26-2011  . Cancer [facility]) 2006   uterine  . Complication of anesthesia PT STATES HAS LETTER FROM [place] [facility]. STATING THAT SHE HAS A VERY SMALL AIRWAY (LAST SURG. HYSTERECTOMY AT [facility] IN 2006   PT STATES LYNN BEASON CRNA WHOM WORKS HERE TOLD SHE WOULD BE OKAY TO BE DONE AT [facility]  . Diabetes mellitus    no meds taken now, checks cbg once or twice a week   . 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 GERD (gastroesophageal reflux disease)   . Heart murmur    with last visit, MD states "outgrown" murmur  . Hemorrhoids, internal, with bleeding and Grade 2 prolapse 03/05/2013   All positions seen an anoscopy RP columns banded 03/05/2013 LL and RA banded 03/18/2013     . History of endometrial cancer 2006 ---- S/P ABD. HYSTERECTOMY   STAGE I ENDOMETROID CARCINOMA  . Hx of adenomatous colonic polyps 2011   BENIGN  . Hyperlipidemia    no meds now  . Hypertension   . Morbid obesity, weight - 203, BMI - 38.4 05/12/2013  . OSA on CPAP    cpap setting of 16  . Sleep apnea    no CPAP since surgery    Past Surgical History:  Procedure Laterality Date  . ABDOMINAL HYSTERECTOMY   12-20-2004   TAH  . BILATERAL BREAST REDUCTION  11-16-1999  . BREATH TEK H PYLORI N/A 05/27/2013   Procedure: BREATH TEK H PYLORI;  Surgeon: Shann Medal, MD;  Location[facility];  Service: General;  Laterality: N/A;  . [place]  . COLONOSCOPY    . COLONOSCOPY N/A 04/25/2014   Procedure: COLONOSCOPY;  Surgeon: Gatha Mayer, MD;  [facility];  Service: Endoscopy;  Laterality: N/A;  . CYSTOSCOPY  09/06/2011   Procedure: CYSTOSCOPY;  Surgeon: Ailene Rud, MD;  Location: [facility];  Service: Urology;  Laterality: N/A;  LYNX SLING   . ESOPHAGOGASTRODUODENOSCOPY (EGD) WITH PROPOFOL N/A 09/27/2014   Procedure: ESOPHAGOGASTRODUODENOSCOPY (EGD) WITH PROPOFOL;  Surgeon: Gatha Mayer, MD;  [facility];  Service: Endoscopy;  Laterality: N/A;  . ESOPHAGOGASTRODUODENOSCOPY (EGD) WITH PROPOFOL N/A 03/11/2016   Procedure: ESOPHAGOGASTRODUODENOSCOPY (EGD) WITH PROPOFOL;  Surgeon: Jerene Bears, MD;  [facility];  Service: Endoscopy;  Laterality: N/A;  . GASTRIC ROUX-EN-Y N/A 10/04/2013   Procedure: LAPAROSCOPIC ROUX-EN-Y GASTRIC BYPASS WITH UPPER ENDOSCOPY;  Surgeon: Shann Medal, MD;  Location[facility];  Service: General;  Laterality: N/A;  . HEMORRHOID BANDING  02/2013  . PUBOVAGINAL SLING  09/06/2011   Procedure: PUBO-VAGINAL SLING;  Surgeon: Ailene Rud, MD;  Location: [facility];  Service: Urology;  Laterality: N/A;  . UVULOPALATOPHARYNGOPLASTY, TONSILLECTOMY AND SEPTOPLASTY  1994    There were no vitals filed for this visit.      Subjective Assessment - 02/20/17 0935    Subjective Pt. reporting no new issues just typical "achiness following last visit".     Patient Stated Goals improve pain and function   Currently in Pain? Yes   Pain Score 2    Pain Location Shoulder   Pain Orientation Right   Pain Descriptors / Indicators Sore   Pain Type Acute pain   Pain Onset More than a month ago    Pain Frequency Constant   Multiple Pain Sites No            [facility] PT Assessment - 02/20/17 1003      Assessment   Next MD Visit 11.14.18                     [facility] Adult PT Treatment/Exercise - 02/20/17 0951      Self-Care   Self-Care Other Self-Care Comments   Other Self-Care Comments  sled push/pull 2 x 25 ft each way; to simulate pt. handling with work related tasks   with 20# in sled      Shoulder Exercises: Prone   Other Prone Exercises Quadruped Serratus punch 2 x 20 reps     Shoulder Exercises: Standing   External Rotation 15 reps;Theraband;Right   Theraband Level (Shoulder External Rotation) Level 1 (Yellow)   External Rotation Limitations elbows at 45dg    Other Standing Exercises B pallof press with red TB in door x 15 reps each way   Other Standing Exercises R UE cabinet reaches - flexion (3#), abduction (3#) x 1 min each      Shoulder Exercises: ROM/Strengthening   UBE (Upper Arm Bike) L3.5 x 6 min (3/3)   Cybex Row 20 reps   Cybex Row Limitations 10# R single arm    Wall Pushups 15 reps   Pushups Limitations with orange pball against wall   Other ROM/Strengthening Exercises BATCA cable row single arm from floor 5# x 15 reps each side   Other ROM/Strengthening Exercises BATCA pull down 15# x 15 reps                   PT Short Term Goals - 01/13/17 1408      PT SHORT TERM GOAL #1   Title patient to be independent with initial HEP    Status Achieved     PT SHORT TERM GOAL #2   Title patient to improve R shoulder PROM equal to that of L shoulder without pain limiting    Status On-going           PT Long Term Goals - 01/13/17 1413      PT LONG TERM GOAL #1   Title patient to be independent with advanced HEP   Status On-going     PT LONG TERM GOAL #2   Title patient to improve R shoulder AROM equal to that of L shoulder without pain limiting   Status On-going     PT LONG TERM GOAL #3   Title patient to improve gross R UE  strength to >/= 4/5   Status On-going     PT LONG TERM GOAL #4   Title patient to report ability to perform ADLs and household tasks without pain limiting function  Status On-going  still limited with turning steering wheel while driving and reaching into fridge      PT LONG TERM GOAL #5   Title patient to report ability to return to work at full capacity.    Status On-going               Plan - 02/20/17 1019    Clinical Impression Statement Sandra [name] doing well today reporting typical low-level ache at R shoulder this morning.  Some additional weight added for push/pull work simulation on sled today, which was tolerated well.  Additional activities added in treatment today to simulate push/pull pt. handling with pt. tolerating well.  Will continue to progress per pt. in coming visits.     PT Treatment/Interventions ADLs/Self Care Home Management;Cryotherapy;Electrical Stimulation;Iontophoresis 4mg /ml Dexamethasone;Moist Heat;Ultrasound;Therapeutic exercise;Therapeutic activities;Patient/family education;Manual techniques;Passive range of motion;Vasopneumatic Device;Taping;Dry needling      Patient will benefit from skilled therapeutic intervention in order to improve the following deficits and impairments:  Decreased activity tolerance, Decreased range of motion, Decreased strength, Impaired UE functional use, Pain  Visit Diagnosis: Acute pain of right shoulder  Stiffness of right shoulder, not elsewhere classified  Abnormal posture  Muscle weakness (generalized)     Problem List Patient Active Problem List   Diagnosis Date Noted  . Cervical disc disorder with radiculopathy of cervical region 04/25/2016  . Nonallopathic lesion of cervical region 04/17/2016  . Nonallopathic lesion of thoracic region 04/17/2016  . Nonallopathic lesion of rib cage 04/17/2016  . Nonallopathic lesion of lumbosacral region 04/17/2016  . Degenerative cervical disc 03/29/2016  . Degenerative  arthritis of right knee 03/29/2016  . Acute blood loss anemia   . UGIB (upper gastrointestinal bleed) 03/10/2016  . Upper GI bleed 03/10/2016  . 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 Abdominal pain, epigastric   . Adjustment disorder with depressed mood 07/14/2014  . Hx of colonic polyps   . 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 Chronic constipation 03/05/2013  . Coronary atherosclerosis of native coronary artery 08/27/2011  . History of uterine cancer 08/15/2011  . Hypercholesterolemia 08/15/2011  . History of colonic polyps 12/29/2009    Bess Harvest, PTA 02/20/17 12:30 PM  [facility] [address]  [place], [place], [address] Phone: [phone]   Fax:  [phone]  Name: Sandra [name] MRN: [id number] Date of Birth: [birth year]

## 2017-02-24 ENCOUNTER — Ambulatory Visit: Payer: 59 | Admitting: Physical Therapy

## 2017-02-24 DIAGNOSIS — M6281 Muscle weakness (generalized): Secondary | ICD-10-CM

## 2017-02-24 DIAGNOSIS — M25511 Pain in right shoulder: Secondary | ICD-10-CM

## 2017-02-24 DIAGNOSIS — M25611 Stiffness of right shoulder, not elsewhere classified: Secondary | ICD-10-CM

## 2017-02-24 DIAGNOSIS — R293 Abnormal posture: Secondary | ICD-10-CM | POA: Diagnosis not present

## 2017-02-24 NOTE — Therapy (Signed)
[facility] [address]  [place], [place], [address] Phone: [phone]   Fax:  [phone]  Physical Therapy Treatment  Patient Details  Name: Sandra [name] MRN: [id number] Date of Birth: 04/24/1961 Referring Provider: Dr. Mardelle Matte  Encounter Date: 02/24/2017      PT End of Session - 02/24/17 0926    Visit Number 19   Number of Visits 28   Date for PT Re-Evaluation 03/24/17   Authorization Type [facility]   PT Start Time 0923   PT Stop Time 1014   PT Time Calculation (min) 51 min   Activity Tolerance Patient tolerated treatment well   Behavior During Therapy [facility] for tasks assessed/performed      Past Medical History:  Diagnosis Date  . Anxiety   . Arthritis   . CAD (coronary artery disease) NON-OBSTRUCTIVE PER DR HOCHREIN NOTE 08-26-2011  . Cancer [facility]) 2006   uterine  . Complication of anesthesia PT STATES HAS LETTER FROM [place] [facility]. STATING THAT SHE HAS A VERY SMALL AIRWAY (LAST SURG. HYSTERECTOMY AT [facility] IN 2006   PT STATES LYNN BEASON CRNA WHOM WORKS HERE TOLD SHE WOULD BE OKAY TO BE DONE AT [facility]  . Diabetes mellitus    no meds taken now, checks cbg once or twice a week   . 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 GERD (gastroesophageal reflux disease)   . Heart murmur    with last visit, MD states "outgrown" murmur  . Hemorrhoids, internal, with bleeding and Grade 2 prolapse 03/05/2013   All positions seen an anoscopy RP columns banded 03/05/2013 LL and RA banded 03/18/2013     . History of endometrial cancer 2006 ---- S/P ABD. HYSTERECTOMY   STAGE I ENDOMETROID CARCINOMA  . Hx of adenomatous colonic polyps 2011   BENIGN  . Hyperlipidemia    no meds now  . Hypertension   . Morbid obesity, weight - 203, BMI - 38.4 05/12/2013  . OSA on CPAP    cpap setting of 16  . Sleep apnea    no CPAP since surgery    Past Surgical History:  Procedure Laterality Date  . ABDOMINAL HYSTERECTOMY   12-20-2004   TAH  . BILATERAL BREAST REDUCTION  11-16-1999  . BREATH TEK H PYLORI N/A 05/27/2013   Procedure: BREATH TEK H PYLORI;  Surgeon: Shann Medal, MD;  Location: [facility];  Service: General;  Laterality: N/A;  . [place]  . COLONOSCOPY    . COLONOSCOPY N/A 04/25/2014   Procedure: COLONOSCOPY;  Surgeon: Gatha Mayer, MD;  Location: [facility];  Service: Endoscopy;  Laterality: N/A;  . CYSTOSCOPY  09/06/2011   Procedure: CYSTOSCOPY;  Surgeon: Ailene Rud, MD;  Location: [facility];  Service: Urology;  Laterality: N/A;  LYNX SLING   . ESOPHAGOGASTRODUODENOSCOPY (EGD) WITH PROPOFOL N/A 09/27/2014   Procedure: ESOPHAGOGASTRODUODENOSCOPY (EGD) WITH PROPOFOL;  Surgeon: Gatha Mayer, MD;  [facility];  Service: Endoscopy;  Laterality: N/A;  . ESOPHAGOGASTRODUODENOSCOPY (EGD) WITH PROPOFOL N/A 03/11/2016   Procedure: ESOPHAGOGASTRODUODENOSCOPY (EGD) WITH PROPOFOL;  Surgeon: Jerene Bears, MD;  [facility];  Service: Endoscopy;  Laterality: N/A;  . GASTRIC ROUX-EN-Y N/A 10/04/2013   Procedure: LAPAROSCOPIC ROUX-EN-Y GASTRIC BYPASS WITH UPPER ENDOSCOPY;  Surgeon: Shann Medal, MD;  [facility];  Service: General;  Laterality: N/A;  . HEMORRHOID BANDING  02/2013  . PUBOVAGINAL SLING  09/06/2011   Procedure: PUBO-VAGINAL SLING;  Surgeon: Ailene Rud, MD;  Location: [facility];  Service: Urology;  Laterality: N/A;  . UVULOPALATOPHARYNGOPLASTY, TONSILLECTOMY AND SEPTOPLASTY  1994    There were no vitals filed for this visit.      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    Pertinent History gastric bypass   Diagnostic tests Xray - normal healing   Patient Stated Goals improve pain and function   Currently in Pain? Yes   Pain Score 3    Pain Location Shoulder   Pain Orientation Right   Pain Descriptors / Indicators Sore   Pain  Type Acute pain            [facility] PT Assessment - 02/24/17 0001      AROM   Right Shoulder Flexion 138 Degrees   Right Shoulder ABduction 105 Degrees   Right Shoulder Internal Rotation --  FIR to sacrum   Right Shoulder External Rotation --  FER to ~occiput                     [facility] Adult PT Treatment/Exercise - 02/24/17 0001      Shoulder Exercises: Prone   Other Prone Exercises modified plank with resisted reaches - yellow tband x 10 each side   Other Prone Exercises plank walkovers - single aerobic step x 5 each direction     Shoulder Exercises: Standing   Other Standing Exercises chest press - B UE - green tband x 15 reps   Other Standing Exercises side pulling with side step - double green band - 15 reps stepping right     Shoulder Exercises: ROM/Strengthening   UBE (Upper Arm Bike) L4 x 6 min (3/3)   Ball on Wall walking orange pball up wall - 3# at wrist x 15 reps   Other ROM/Strengthening Exercises chest pulls - 20# x 15 reps   Other ROM/Strengthening Exercises BATCA pull down 15# x 15 reps      Shoulder Exercises: Stretch   Other Shoulder Stretches pec stretch over foam roller - STM to R pec region                   PT Short Term Goals - 01/13/17 1408      PT SHORT TERM GOAL #1   Title patient to be independent with initial HEP    Status Achieved     PT SHORT TERM GOAL #2   Title patient to improve R shoulder PROM equal to that of L shoulder without pain limiting    Status On-going           PT Long Term Goals - 02/24/17 1308      PT LONG TERM GOAL #1   Title patient to be independent with advanced HEP   Status On-going     PT LONG TERM GOAL #2   Title patient to improve R shoulder AROM equal to that of L shoulder without pain limiting   Status On-going     PT LONG TERM GOAL #3   Title patient to improve gross R UE strength to >/= 4/5   Status On-going     PT LONG TERM GOAL #4   Title patient to report ability to  perform ADLs and household tasks without pain limiting function   Status On-going     PT LONG TERM GOAL #5   Title patient to report ability to return  to work at full capacity.    Status On-going               Plan - 02/24/17 1308    Clinical Impression Statement Sandra [name] today with subjective reports of pain at R shoulder following moving bricks this weekend. PT session today focusing on simulating work related tasks as well as body weight acceptance and multi-directional stability. patient doing well with all tasks with some rest breaks needed. AROM slightly reduced today, of which she relates to pain exacerbation from over the weekend. Will continue to progress patient towards return to work capability with POC extension.    PT Treatment/Interventions ADLs/Self Care Home Management;Cryotherapy;Electrical Stimulation;Iontophoresis 4mg /ml Dexamethasone;Moist Heat;Ultrasound;Therapeutic exercise;Therapeutic activities;Patient/family education;Manual techniques;Passive range of motion;Vasopneumatic Device;Taping;Dry needling   Consulted and Agree with Plan of Care Patient      Patient will benefit from skilled therapeutic intervention in order to improve the following deficits and impairments:  Decreased activity tolerance, Decreased range of motion, Decreased strength, Impaired UE functional use, Pain  Visit Diagnosis: Acute pain of right shoulder - Plan: PT plan of care cert/re-cert  Stiffness of right shoulder, not elsewhere classified - Plan: PT plan of care cert/re-cert  Abnormal posture - Plan: PT plan of care cert/re-cert  Muscle weakness (generalized) - Plan: PT plan of care cert/re-cert     Problem List Patient Active Problem List   Diagnosis Date Noted  . Cervical disc disorder with radiculopathy of cervical region 04/25/2016  . Nonallopathic lesion of cervical region 04/17/2016  . Nonallopathic lesion of thoracic region 04/17/2016  . Nonallopathic lesion of rib cage  04/17/2016  . Nonallopathic lesion of lumbosacral region 04/17/2016  . Degenerative cervical disc 03/29/2016  . Degenerative arthritis of right knee 03/29/2016  . Acute blood loss anemia   . UGIB (upper gastrointestinal bleed) 03/10/2016  . Upper GI bleed 03/10/2016  . 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 Abdominal pain, epigastric   . Adjustment disorder with depressed mood 07/14/2014  . Hx of colonic polyps   . 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 Chronic constipation 03/05/2013  . Coronary atherosclerosis of native coronary artery 08/27/2011  . History of uterine cancer 08/15/2011  . Hypercholesterolemia 08/15/2011  . History of colonic polyps 12/29/2009     Lanney Gins, PT, DPT 02/24/17 1:17 PM   [facility] [address]  [place], [place], [address] Phone: [phone]   Fax:  [phone]  Name: Sandra [name] MRN: [id number] Date of Birth: 16-Oct-1960

## 2017-02-27 ENCOUNTER — Ambulatory Visit: Payer: 59 | Attending: Orthopedic Surgery | Admitting: Physical Therapy

## 2017-02-27 DIAGNOSIS — M25511 Pain in right shoulder: Secondary | ICD-10-CM | POA: Insufficient documentation

## 2017-02-27 DIAGNOSIS — M25611 Stiffness of right shoulder, not elsewhere classified: Secondary | ICD-10-CM | POA: Diagnosis not present

## 2017-02-27 DIAGNOSIS — M6281 Muscle weakness (generalized): Secondary | ICD-10-CM | POA: Diagnosis not present

## 2017-02-27 DIAGNOSIS — R293 Abnormal posture: Secondary | ICD-10-CM | POA: Diagnosis not present

## 2017-02-27 NOTE — Therapy (Signed)
[facility] [address]  [place], [place], [address] Phone: [phone]   Fax:  [phone]  Physical Therapy Treatment  Patient Details  Name: Sandra [name] MRN: [id number] Date of Birth: 11-22-1960 Referring Provider: Dr. Mardelle Matte  Encounter Date: 02/27/2017      PT End of Session - 02/27/17 0904    Visit Number 20   Number of Visits 28   Date for PT Re-Evaluation 03/24/17   Authorization Type [facility]   PT Start Time 0901  pt late   PT Stop Time 0930   PT Time Calculation (min) 29 min   Activity Tolerance Patient tolerated treatment well   Behavior During Therapy [facility] for tasks assessed/performed      Past Medical History:  Diagnosis Date  . Anxiety   . Arthritis   . CAD (coronary artery disease) NON-OBSTRUCTIVE PER DR HOCHREIN NOTE 08-26-2011  . Cancer [facility]) 2006   uterine  . Complication of anesthesia PT STATES HAS LETTER FROM [place] [facility]. STATING THAT SHE HAS A VERY SMALL AIRWAY (LAST SURG. HYSTERECTOMY AT [facility] IN 2006   PT STATES LYNN BEASON CRNA WHOM WORKS HERE TOLD SHE WOULD BE OKAY TO BE DONE AT [facility]  . Diabetes mellitus    no meds taken now, checks cbg once or twice a week   . 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 GERD (gastroesophageal reflux disease)   . Heart murmur    with last visit, MD states "outgrown" murmur  . Hemorrhoids, internal, with bleeding and Grade 2 prolapse 03/05/2013   All positions seen an anoscopy RP columns banded 03/05/2013 LL and RA banded 03/18/2013     . History of endometrial cancer 2006 ---- S/P ABD. HYSTERECTOMY   STAGE I ENDOMETROID CARCINOMA  . Hx of adenomatous colonic polyps 2011   BENIGN  . Hyperlipidemia    no meds now  . Hypertension   . Morbid obesity, weight - 203, BMI - 38.4 05/12/2013  . OSA on CPAP    cpap setting of 16  . Sleep apnea    no CPAP since surgery    Past Surgical History:  Procedure Laterality Date  . ABDOMINAL  HYSTERECTOMY  12-20-2004   TAH  . BILATERAL BREAST REDUCTION  11-16-1999  . BREATH TEK H PYLORI N/A 05/27/2013   Procedure: BREATH TEK H PYLORI;  Surgeon: Shann Medal, MD;  [facility];  Service: General;  Laterality: N/A;  . [place]  . COLONOSCOPY    . COLONOSCOPY N/A 04/25/2014   Procedure: COLONOSCOPY;  Surgeon: Gatha Mayer, MD;  [facility];  Service: Endoscopy;  Laterality: N/A;  . CYSTOSCOPY  09/06/2011   Procedure: CYSTOSCOPY;  Surgeon: Ailene Rud, MD;  [facility];  Service: Urology;  Laterality: N/A;  LYNX SLING   . ESOPHAGOGASTRODUODENOSCOPY (EGD) WITH PROPOFOL N/A 09/27/2014   Procedure: ESOPHAGOGASTRODUODENOSCOPY (EGD) WITH PROPOFOL;  Surgeon: Gatha Mayer, MD;  [facility];  Service: Endoscopy;  Laterality: N/A;  . ESOPHAGOGASTRODUODENOSCOPY (EGD) WITH PROPOFOL N/A 03/11/2016   Procedure: ESOPHAGOGASTRODUODENOSCOPY (EGD) WITH PROPOFOL;  Surgeon: Jerene Bears, MD;  [facility];  Service: Endoscopy;  Laterality: N/A;  . GASTRIC ROUX-EN-Y N/A 10/04/2013   Procedure: LAPAROSCOPIC ROUX-EN-Y GASTRIC BYPASS WITH UPPER ENDOSCOPY;  Surgeon: Shann Medal, MD;  [facility];  Service: General;  Laterality: N/A;  . HEMORRHOID BANDING  02/2013  . PUBOVAGINAL SLING  09/06/2011  Procedure: Gaynelle Arabian;  Surgeon: Ailene Rud, MD;  Location: [facility];  Service: Urology;  Laterality: N/A;  . UVULOPALATOPHARYNGOPLASTY, TONSILLECTOMY AND SEPTOPLASTY  1994    There were no vitals filed for this visit.      Subjective Assessment - 02/27/17 0904    Subjective Had some soreness after last session, feeling well today   Pertinent History gastric bypass   Diagnostic tests Xray - normal healing   Patient Stated Goals improve pain and function   Currently in Pain? Yes   Pain Score 1    Pain Location Shoulder   Pain Orientation Right   Pain Descriptors /  Indicators Sore   Pain Type Acute pain                         [facility] Adult PT Treatment/Exercise - 02/27/17 0905      Shoulder Exercises: Seated   Other Seated Exercises 1/2 kneeling - single arm row - 5#     Shoulder Exercises: Prone   Other Prone Exercises modified plank - forward step ups to 4" aerobic step x 12 reps   Other Prone Exercises BOSU weight shifts x 15 reps each     Shoulder Exercises: Standing   Row Both;20 reps   Row Limitations TRX   Other Standing Exercises chest press - B UE - green tband x 15 reps     Shoulder Exercises: ROM/Strengthening   UBE (Upper Arm Bike) L4 x 4 min (2/2)   Rhythmic Stabilization, Seated standing - orange pball against wall - 3 x 30 seconds                  PT Short Term Goals - 01/13/17 1408      PT SHORT TERM GOAL #1   Title patient to be independent with initial HEP    Status Achieved     PT SHORT TERM GOAL #2   Title patient to improve R shoulder PROM equal to that of L shoulder without pain limiting    Status On-going           PT Long Term Goals - 02/24/17 1308      PT LONG TERM GOAL #1   Title patient to be independent with advanced HEP   Status On-going     PT LONG TERM GOAL #2   Title patient to improve R shoulder AROM equal to that of L shoulder without pain limiting   Status On-going     PT LONG TERM GOAL #3   Title patient to improve gross R UE strength to >/= 4/5   Status On-going     PT LONG TERM GOAL #4   Title patient to report ability to perform ADLs and household tasks without pain limiting function   Status On-going     PT LONG TERM GOAL #5   Title patient to report ability to return to work at full capacity.    Status On-going               Plan - 02/27/17 0905    Clinical Impression Statement Patient late limting session. Some soreness produced after last session, howeer, very little pain today. Continued work with both body weight and multidirectional  stabilization with good tolerance. Patient continuing to make gains towards return to work tasks, however, pain at end ranges of forward elevation as well as reduced endurance at R shoulder.    PT Treatment/Interventions ADLs/Self Care  Home Management;Cryotherapy;Electrical Stimulation;Iontophoresis 4mg /ml Dexamethasone;Moist Heat;Ultrasound;Therapeutic exercise;Therapeutic activities;Patient/family education;Manual techniques;Passive range of motion;Vasopneumatic Device;Taping;Dry needling   Consulted and Agree with Plan of Care Patient      Patient will benefit from skilled therapeutic intervention in order to improve the following deficits and impairments:  Decreased activity tolerance, Decreased range of motion, Decreased strength, Impaired UE functional use, Pain  Visit Diagnosis: Acute pain of right shoulder  Stiffness of right shoulder, not elsewhere classified  Abnormal posture  Muscle weakness (generalized)     Problem List Patient Active Problem List   Diagnosis Date Noted  . Cervical disc disorder with radiculopathy of cervical region 04/25/2016  . Nonallopathic lesion of cervical region 04/17/2016  . Nonallopathic lesion of thoracic region 04/17/2016  . Nonallopathic lesion of rib cage 04/17/2016  . Nonallopathic lesion of lumbosacral region 04/17/2016  . Degenerative cervical disc 03/29/2016  . Degenerative arthritis of right knee 03/29/2016  . Acute blood loss anemia   . UGIB (upper gastrointestinal bleed) 03/10/2016  . Upper GI bleed 03/10/2016  . 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 Abdominal pain, epigastric   . Adjustment disorder with depressed mood 07/14/2014  . Hx of colonic polyps   . 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 Chronic constipation 03/05/2013  . Coronary atherosclerosis of native coronary artery 08/27/2011  . History of uterine cancer 08/15/2011  . Hypercholesterolemia 08/15/2011  . History of colonic polyps 12/29/2009      Lanney Gins, PT, DPT 02/27/17 1:03 PM   [facility] [address]  [place], [place], [address] Phone: [phone]   Fax:  [phone]  Name: AKYA FIORELLO MRN: [id number] Date of Birth: 08/20/1960

## 2017-03-03 ENCOUNTER — Ambulatory Visit: Payer: 59

## 2017-03-03 DIAGNOSIS — M25511 Pain in right shoulder: Secondary | ICD-10-CM | POA: Diagnosis not present

## 2017-03-03 DIAGNOSIS — M25611 Stiffness of right shoulder, not elsewhere classified: Secondary | ICD-10-CM

## 2017-03-03 DIAGNOSIS — M6281 Muscle weakness (generalized): Secondary | ICD-10-CM | POA: Diagnosis not present

## 2017-03-03 DIAGNOSIS — R293 Abnormal posture: Secondary | ICD-10-CM

## 2017-03-03 NOTE — Therapy (Signed)
[facility] [address]  [place], [place], [address] Phone: [phone]   Fax:  [phone]  Physical Therapy Treatment  Patient Details  Name: Sandra [name] MRN: [id number] Date of Birth: [birth year] Referring Provider: Dr. Mardelle Matte   Encounter Date: 03/03/2017  PT End of Session - 03/03/17 0856    Visit Number  21    Number of Visits  28    Date for PT Re-Evaluation  03/24/17    Authorization Type  [facility]    PT Start Time  0846    PT Stop Time  0945    PT Time Calculation (min)  59 min    Activity Tolerance  Patient tolerated treatment well    Behavior During Therapy  [facility] for tasks assessed/performed       Past Medical History:  Diagnosis Date  . Anxiety   . Arthritis   . CAD (coronary artery disease) NON-OBSTRUCTIVE PER DR HOCHREIN NOTE 08-26-2011  . Cancer [facility]) 2006   uterine  . Complication of anesthesia PT STATES HAS LETTER FROM [place] [facility]. STATING THAT SHE HAS A VERY SMALL AIRWAY (LAST SURG. [facility]OMY AT [facility] 2006   PT STATES LYNN BEASON CRNA WHOM WORKS HERE TOLD SHE WOULD BE OKAY TO BE DONE AT [facility]  . Diabetes mellitus    no meds taken now, checks cbg once or twice a week   . 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 GERD (gastroesophageal reflux disease)   . Heart murmur    with last visit, MD states "outgrown" murmur  . Hemorrhoids, internal, with bleeding and Grade 2 prolapse 03/05/2013   All positions seen an anoscopy RP columns banded 03/05/2013 LL and RA banded 03/18/2013     . History of endometrial cancer 2006 ---- S/P ABD. HYSTERECTOMY   STAGE I ENDOMETROID CARCINOMA  . Hx of adenomatous colonic polyps 2011   BENIGN  . Hyperlipidemia    no meds now  . Hypertension   . Morbid obesity, weight - 203, BMI - 38.4 05/12/2013  . OSA on CPAP    cpap setting of 16  . Sleep apnea    no CPAP since surgery    Past Surgical History:  Procedure Laterality Date  . ABDOMINAL  HYSTERECTOMY  12-20-2004   TAH  . BILATERAL BREAST REDUCTION  11-16-1999  . CESAREAN SECTION  1989  . COLONOSCOPY    . HEMORRHOID BANDING  02/2013  . UVULOPALATOPHARYNGOPLASTY, TONSILLECTOMY AND SEPTOPLASTY  1994    There were no vitals filed for this visit.  Subjective Assessment - 03/03/17 0849    Subjective  Pt. reporting "it's getting easier to wash my hair in the shower".      Patient Stated Goals  improve pain and function    Currently in Pain?  Yes    Pain Score  2     Pain Orientation  Right    Pain Descriptors / Indicators  Sore    Pain Type  Acute pain    Pain Onset  More than a month ago    Pain Frequency  Constant    Aggravating Factors   Reaching back, reaching overhead     Pain Relieving Factors  tylenol     Multiple Pain Sites  No                      [facility] Adult PT Treatment/Exercise - 03/03/17 0859      Self-Care  Self-Care  Other Self-Care Comments    Other Self-Care Comments   15# cuffweights (10# + 5#) in pillowcase with diagonal B UE push/pull on elevated mat table 2 x 15 reps each way;       Shoulder Exercises: Prone   Other Prone Exercises  BOSU weight shifts x 20 reps each      Shoulder Exercises: Standing   Flexion  Right;Left;10 reps;Theraband    Theraband Level (Shoulder Flexion)  Level 1 (Yellow)    Flexion Limitations  Alternating flexion/extension with yellow TB leaning on 1/2 foam on wall     Row  Both;15 reps    Row Limitations  mid row on TRX     Other Standing Exercises  R shoulder 2000Gr med ball catch/throw 2 x 1 min  2 UE on catch    2 UE on catch    Other Standing Exercises  Alternating UE pushup on 2000Gr med ball on wall x 10 reps       Shoulder Exercises: ROM/Strengthening   UBE (Upper Arm Bike)  L4 x 3 min (forward/backwards)      Shoulder Exercises: Stretch   Other Shoulder Stretches  Modified pushup position <> full pushup position on BOSU ball (down) 3" x 10 reps     Other Shoulder Stretches  R UT stretch x  30 sec       Moist Heat Therapy   Number Minutes Moist Heat  15 Minutes    Moist Heat Location  Shoulder R   R              PT Short Term Goals - 01/13/17 1408      PT SHORT TERM GOAL #1   Title  patient to be independent with initial HEP     Status  Achieved      PT SHORT TERM GOAL #2   Title  patient to improve R shoulder PROM equal to that of L shoulder without pain limiting     Status  On-going        PT Long Term Goals - 02/24/17 1308      PT LONG TERM GOAL #1   Title  patient to be independent with advanced HEP    Status  On-going      PT LONG TERM GOAL #2   Title  patient to improve R shoulder AROM equal to that of L shoulder without pain limiting    Status  On-going      PT LONG TERM GOAL #3   Title  patient to improve gross R UE strength to >/= 4/5    Status  On-going      PT LONG TERM GOAL #4   Title  patient to report ability to perform ADLs and household tasks without pain limiting function    Status  On-going      PT LONG TERM GOAL #5   Title  patient to report ability to return to work at full capacity.     Status  On-going            Plan - 03/03/17 0857    Clinical Impression Statement  Sandra [name] tolerating progression of work simulation tasks and scapular stabilization activities today well.  Reports she is able to clean hair in shower with greater ease.  Progressing well toward established goals.      PT Treatment/Interventions  ADLs/Self Care Home Management;Cryotherapy;Electrical Stimulation;Iontophoresis 4mg /ml Dexamethasone;Moist Heat;Ultrasound;Therapeutic exercise;Therapeutic activities;Patient/family education;Manual techniques;Passive range of motion;Vasopneumatic Device;Taping;Dry needling  Patient will benefit from skilled therapeutic intervention in order to improve the following deficits and impairments:  Decreased activity tolerance, Decreased range of motion, Decreased strength, Impaired UE functional use, Pain  Visit  Diagnosis: Acute pain of right shoulder  Stiffness of right shoulder, not elsewhere classified  Abnormal posture  Muscle weakness (generalized)     Problem List Patient Active Problem List   Diagnosis Date Noted  . Cervical disc disorder with radiculopathy of cervical region 04/25/2016  . Nonallopathic lesion of cervical region 04/17/2016  . Nonallopathic lesion of thoracic region 04/17/2016  . Nonallopathic lesion of rib cage 04/17/2016  . Nonallopathic lesion of lumbosacral region 04/17/2016  . Degenerative cervical disc 03/29/2016  . Degenerative arthritis of right knee 03/29/2016  . Acute blood loss anemia   . UGIB (upper gastrointestinal bleed) 03/10/2016  . Upper GI bleed 03/10/2016  . 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 Abdominal pain, epigastric   . Adjustment disorder with depressed mood 07/14/2014  . Hx of colonic polyps   . 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 Chronic constipation 03/05/2013  . Coronary atherosclerosis of native coronary artery 08/27/2011  . History of uterine cancer 08/15/2011  . Hypercholesterolemia 08/15/2011  . History of colonic polyps 12/29/2009    Bess Harvest, PTA 03/03/17 9:14 PM  [facility] [address]  [place], [place], [address] Phone: [phone]   Fax:  [phone]  Name: BRAXTON WEISBECKER MRN: [id number] Date of Birth: 1961/04/23

## 2017-03-06 ENCOUNTER — Ambulatory Visit: Payer: 59 | Admitting: Physical Therapy

## 2017-03-06 ENCOUNTER — Encounter: Payer: Self-pay | Admitting: Physical Therapy

## 2017-03-06 DIAGNOSIS — R293 Abnormal posture: Secondary | ICD-10-CM

## 2017-03-06 DIAGNOSIS — M6281 Muscle weakness (generalized): Secondary | ICD-10-CM | POA: Diagnosis not present

## 2017-03-06 DIAGNOSIS — M25511 Pain in right shoulder: Secondary | ICD-10-CM | POA: Diagnosis not present

## 2017-03-06 DIAGNOSIS — M25611 Stiffness of right shoulder, not elsewhere classified: Secondary | ICD-10-CM

## 2017-03-06 NOTE — Therapy (Signed)
[facility] [address]  [place], [place], [address] Phone: [phone]   Fax:  [phone]  Physical Therapy Treatment  Patient Details  Name: Sandra [name] MRN: [id number] Date of Birth: 1960/12/22 Referring Provider: Dr. Mardelle Matte   Encounter Date: 03/06/2017  PT End of Session - 03/06/17 0848    Visit Number  22    Number of Visits  28    Date for PT Re-Evaluation  03/24/17    Authorization Type  [facility]    PT Start Time  0846    PT Stop Time  0927    PT Time Calculation (min)  41 min    Activity Tolerance  Patient tolerated treatment well    Behavior During Therapy  [facility] for tasks assessed/performed       Past Medical History:  Diagnosis Date  . Anxiety   . Arthritis   . CAD (coronary artery disease) NON-OBSTRUCTIVE PER DR HOCHREIN NOTE 08-26-2011  . Cancer [facility]) 2006   uterine  . Complication of anesthesia PT STATES HAS LETTER FROM [place] [facility]. STATING THAT SHE HAS A VERY SMALL AIRWAY (LAST SURG. HYSTERECTOMY AT [facility] IN 2006   PT STATES Sandra BEASON CRNA WHOM WORKS HERE TOLD SHE WOULD BE OKAY TO BE DONE AT [facility]  . Diabetes mellitus    no meds taken now, checks cbg once or twice a week   . 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 GERD (gastroesophageal reflux disease)   . Heart murmur    with last visit, MD states "outgrown" murmur  . Hemorrhoids, internal, with bleeding and Grade 2 prolapse 03/05/2013   All positions seen an anoscopy RP columns banded 03/05/2013 LL and RA banded 03/18/2013     . History of endometrial cancer 2006 ---- S/P ABD. HYSTERECTOMY   STAGE I ENDOMETROID CARCINOMA  . Hx of adenomatous colonic polyps 2011   BENIGN  . Hyperlipidemia    no meds now  . Hypertension   . Morbid obesity, weight - 203, BMI - 38.4 05/12/2013  . OSA on CPAP    cpap setting of 16  . Sleep apnea    no CPAP since surgery    Past Surgical History:  Procedure Laterality Date  . ABDOMINAL  HYSTERECTOMY  12-20-2004   TAH  . BILATERAL BREAST REDUCTION  11-16-1999  . CESAREAN SECTION  1989  . COLONOSCOPY    . HEMORRHOID BANDING  02/2013  . UVULOPALATOPHARYNGOPLASTY, TONSILLECTOMY AND SEPTOPLASTY  1994    There were no vitals filed for this visit.  Subjective Assessment - 03/06/17 0848    Subjective  feeling well - no new complaints    Pertinent History  gastric bypass    Diagnostic tests  Xray - normal healing    Patient Stated Goals  improve pain and function    Currently in Pain?  Yes    Pain Score  2     Pain Location  Shoulder UT area   UT area   Pain Orientation  Right    Pain Descriptors / Indicators  Aching;Tightness    Pain Type  Acute pain                      [facility] Adult PT Treatment/Exercise - 03/06/17 0001      Shoulder Exercises: Prone   Other Prone Exercises  multidirectional resisted motion - red tband in quadruped      Shoulder Exercises: Standing  External Rotation  Right;15 reps;Theraband    Theraband Level (Shoulder External Rotation)  Level 2 (Red)    External Rotation Limitations  90 deg abduction    Internal Rotation  Right;15 reps;Theraband    Theraband Level (Shoulder Internal Rotation)  Level 2 (Red)    Internal Rotation Limitations  90 deg abduction    Other Standing Exercises  serratus rolls - red tband at forearms x 15    Other Standing Exercises  shoulder press - 3# x 15 reps; low row - 3# x 15 reps      Shoulder Exercises: ROM/Strengthening   UBE (Upper Arm Bike)  L4 x 6 min (3/3)    Rhythmic Stabilization, Seated  standing orange pball against wall - flexion 2 x 30 seconds, abductions 2 x 30 seconds      Shoulder Exercises: Body Blade   External Rotation  2 reps;30 seconds    External Rotation Limitations  arm extended for 2 reps, elbow by side for 2 reps      Manual Therapy   Manual Therapy  Soft tissue mobilization    Manual therapy comments  patient supine    Soft tissue mobilization  STM to R lateral  deltoid, R UT       Trigger Point Dry Needling - 03/06/17 0906    Consent Given?  Yes    Muscles Treated Upper Body  Upper trapezius Deltoid   Deltoid   Upper Trapezius Response  Twitch reponse elicited;Palpable increased muscle length             PT Short Term Goals - 01/13/17 1408      PT SHORT TERM GOAL #1   Title  patient to be independent with initial HEP     Status  Achieved      PT SHORT TERM GOAL #2   Title  patient to improve R shoulder PROM equal to that of L shoulder without pain limiting     Status  On-going        PT Long Term Goals - 02/24/17 1308      PT LONG TERM GOAL #1   Title  patient to be independent with advanced HEP    Status  On-going      PT LONG TERM GOAL #2   Title  patient to improve R shoulder AROM equal to that of L shoulder without pain limiting    Status  On-going      PT LONG TERM GOAL #3   Title  patient to improve gross R UE strength to >/= 4/5    Status  On-going      PT LONG TERM GOAL #4   Title  patient to report ability to perform ADLs and household tasks without pain limiting function    Status  On-going      PT LONG TERM GOAL #5   Title  patient to report ability to return to work at full capacity.     Status  On-going            Plan - 03/06/17 0953    Clinical Impression Statement  Sandra [name] doing well today. Most limitations continue with IR (reaching behind back). Good work today with all weight bearing and strengthening activities. DN continued at R UT and R lateral deltoid with excellent twitch repsonse, as patient notes great benefit from DN in the past. Will continue to progress patient towards return to work.     PT Treatment/Interventions  ADLs/Self Care Home Management;Cryotherapy;Electrical Stimulation;Iontophoresis  4mg /ml Dexamethasone;Moist Heat;Ultrasound;Therapeutic exercise;Therapeutic activities;Patient/family education;Manual techniques;Passive range of motion;Vasopneumatic Device;Taping;Dry needling     Consulted and Agree with Plan of Care  Patient       Patient will benefit from skilled therapeutic intervention in order to improve the following deficits and impairments:  Decreased activity tolerance, Decreased range of motion, Decreased strength, Impaired UE functional use, Pain  Visit Diagnosis: Acute pain of right shoulder  Stiffness of right shoulder, not elsewhere classified  Abnormal posture  Muscle weakness (generalized)     Problem List Patient Active Problem List   Diagnosis Date Noted  . Cervical disc disorder with radiculopathy of cervical region 04/25/2016  . Nonallopathic lesion of cervical region 04/17/2016  . Nonallopathic lesion of thoracic region 04/17/2016  . Nonallopathic lesion of rib cage 04/17/2016  . Nonallopathic lesion of lumbosacral region 04/17/2016  . Degenerative cervical disc 03/29/2016  . Degenerative arthritis of right knee 03/29/2016  . Acute blood loss anemia   . UGIB (upper gastrointestinal bleed) 03/10/2016  . Upper GI bleed 03/10/2016  . 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 Abdominal pain, epigastric   . Adjustment disorder with depressed mood 07/14/2014  . Hx of colonic polyps   . 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 Chronic constipation 03/05/2013  . Coronary atherosclerosis of native coronary artery 08/27/2011  . History of uterine cancer 08/15/2011  . Hypercholesterolemia 08/15/2011  . History of colonic polyps 12/29/2009     Lanney Gins, PT, DPT 03/06/17 11:44 AM   [facility] [address]  [place], [place], [address] Phone: [phone]   Fax:  [phone]  Name: Sandra [name] MRN: [id number] Date of Birth: 1961-03-07

## 2017-03-10 ENCOUNTER — Ambulatory Visit: Payer: 59

## 2017-03-10 DIAGNOSIS — M6281 Muscle weakness (generalized): Secondary | ICD-10-CM

## 2017-03-10 DIAGNOSIS — R293 Abnormal posture: Secondary | ICD-10-CM | POA: Diagnosis not present

## 2017-03-10 DIAGNOSIS — M25611 Stiffness of right shoulder, not elsewhere classified: Secondary | ICD-10-CM | POA: Diagnosis not present

## 2017-03-10 DIAGNOSIS — M25511 Pain in right shoulder: Secondary | ICD-10-CM | POA: Diagnosis not present

## 2017-03-10 NOTE — Therapy (Signed)
[facility] [address]  [place], [place], [address] Phone: [phone]   Fax:  [phone]  Physical Therapy Treatment  Patient Details  Name: Sandra [name] MRN: [id number] Date of Birth: November 22, 1960 Referring Provider: Dr. Mardelle Matte   Encounter Date: 03/10/2017  PT End of Session - 03/10/17 0851    Visit Number  23    Number of Visits  28    Date for PT Re-Evaluation  03/24/17    Authorization Type  [facility]    PT Start Time  0847    PT Stop Time  0930    PT Time Calculation (min)  43 min    Activity Tolerance  Patient tolerated treatment well    Behavior During Therapy  [facility] for tasks assessed/performed       Past Medical History:  Diagnosis Date  . Anxiety   . Arthritis   . CAD (coronary artery disease) NON-OBSTRUCTIVE PER DR HOCHREIN NOTE 08-26-2011  . Cancer [facility]) 2006   uterine  . Complication of anesthesia PT STATES HAS LETTER FROM [place] [facility]. STATING THAT SHE HAS A VERY SMALL AIRWAY (LAST SURG. HYSTERECTOMY AT [facility] IN 2006   PT STATES LYNN BEASON CRNA WHOM WORKS HERE TOLD SHE WOULD BE OKAY TO BE DONE AT [facility]  . Diabetes mellitus    no meds taken now, checks cbg once or twice a week   . 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 GERD (gastroesophageal reflux disease)   . Heart murmur    with last visit, MD states "outgrown" murmur  . Hemorrhoids, internal, with bleeding and Grade 2 prolapse 03/05/2013   All positions seen an anoscopy RP columns banded 03/05/2013 LL and RA banded 03/18/2013     . History of endometrial cancer 2006 ---- S/P ABD. HYSTERECTOMY   STAGE I ENDOMETROID CARCINOMA  . Hx of adenomatous colonic polyps 2011   BENIGN  . Hyperlipidemia    no meds now  . Hypertension   . Morbid obesity, weight - 203, BMI - 38.4 05/12/2013  . OSA on CPAP    cpap setting of 16  . Sleep apnea    no CPAP since surgery    Past Surgical History:  Procedure Laterality Date  . ABDOMINAL  HYSTERECTOMY  12-20-2004   TAH  . BILATERAL BREAST REDUCTION  11-16-1999  . CESAREAN SECTION  1989  . COLONOSCOPY    . HEMORRHOID BANDING  02/2013  . UVULOPALATOPHARYNGOPLASTY, TONSILLECTOMY AND SEPTOPLASTY  1994    There were no vitals filed for this visit.  Subjective Assessment - 03/10/17 0850    Subjective  Pt. reporting increased shoulder pain which she attributes to cooler weather.      Patient Stated Goals  improve pain and function    Currently in Pain?  Yes    Pain Score  3     Pain Location  Shoulder    Pain Orientation  Right    Pain Descriptors / Indicators  Aching;Tightness    Pain Type  Acute pain    Pain Onset  More than a month ago    Pain Frequency  Constant    Aggravating Factors   Putting coat on, reaching overhead     Multiple Pain Sites  No         [facility] PT Assessment - 03/10/17 0859      AROM   Right Shoulder Flexion  140 Degrees    Right Shoulder ABduction  120 Degrees  Right Shoulder Internal Rotation  -- FIR to sacrum     Right Shoulder External Rotation  -- FER to C6      PROM   PROM Assessment Site  Shoulder    Right/Left Shoulder  Right    Right Shoulder Flexion  155 Degrees    Right Shoulder ABduction  150 Degrees    Right Shoulder Internal Rotation  73 Degrees    Right Shoulder External Rotation  65 Degrees      Strength   Strength Assessment Site  Shoulder    Right/Left Shoulder  Right;Left    Right Shoulder Flexion  4+/5    Right Shoulder ABduction  4/5    Right Shoulder Internal Rotation  4/5    Right Shoulder External Rotation  4/5    Left Shoulder Flexion  4+/5    Left Shoulder ABduction  4+/5    Left Shoulder Internal Rotation  4+/5    Left Shoulder External Rotation  4+/5                  [facility] Adult PT Treatment/Exercise - 03/10/17 0915      Self-Care   Self-Care  Other Self-Care Comments    Other Self-Care Comments   B UE 30# sheet pull, lateral sheet slide, down/back (length wise) on elevated treatment  table x 5 each       Shoulder Exercises: Supine   Protraction  Right;20 reps;Weights    Protraction Weight (lbs)  6      Shoulder Exercises: Prone   Flexion  Both;15 reps    Flexion Limitations  "Y" prone on green P-ball     Extension  Both;15 reps;Weights    Extension Weight (lbs)  2    Extension Limitations  "I" prone on green P-ball cues for scapular retraction     External Rotation  Both;15 reps    External Rotation Limitations  "W" prone on green p-ball     Horizontal ABduction 1  Both;15 reps    Horizontal ABduction 1 Limitations  no wieght due to poor ROM with weights       Shoulder Exercises: Standing   Row  Both;15 reps    Row Limitations  R single arm TRX row x 15 reps       Shoulder Exercises: ROM/Strengthening   UBE (Upper Arm Bike)  L4 x 6 min (3/3)      Iontophoresis   Type of Iontophoresis  Dexamethasone    Location  R lateral shoulder    Dose  1.0 mL    Time  4-6 hours; 78m      Manual Therapy   Manual Therapy  Muscle Energy Technique    Manual therapy comments  patient supine    Passive ROM  PROM into all planes with gentle stretch    Muscle Energy Technique  contract relax stretching into flexion, abduction, IR, ER motions                PT Short Term Goals - 01/13/17 1408      PT SHORT TERM GOAL #1   Title  patient to be independent with initial HEP     Status  Achieved      PT SHORT TERM GOAL #2   Title  patient to improve R shoulder PROM equal to that of L shoulder without pain limiting     Status  On-going        PT Long Term Goals - 03/10/17 05364  PT LONG TERM GOAL #1   Title  patient to be independent with advanced HEP    Status  Partially Met met for current       PT LONG TERM GOAL #2   Title  patient to improve R shoulder AROM equal to that of L shoulder without pain limiting    Status  On-going      PT LONG TERM GOAL #3   Title  patient to improve gross R UE strength to >/= 4/5    Status  Achieved      PT LONG  TERM GOAL #4   Title  patient to report ability to perform ADLs and household tasks without pain limiting function    Status  On-going      PT LONG TERM GOAL #5   Title  patient to report ability to return to work at full capacity.     Status  On-going            Plan - 03/10/17 0852    Clinical Impression Statement  Sandra [name] reporting she has somewhat increased shoulder pain today, which she attributes to cooler weather.  Tolerated all scapular stabilization and strengthening activities in treatment well today.  Tolerated work simulation with weighted sheet pull at chest height on high/low table well today.  Cueing provided to pt. throughout work simulation for LE engagement and scapular retraction as to reduce strain on shoulders.  Showed some improvement in AROM flexion, abduction ROM today, however PROM nearly unchanged.  Met strength goal today with R UE strength now grossly 4/5-4+/5 strength.  Reports some benefit from DN performed last visit and progressing well toward goals.  Will continue focus on return to work in coming visits.      PT Treatment/Interventions  ADLs/Self Care Home Management;Cryotherapy;Electrical Stimulation;Iontophoresis 24m/ml Dexamethasone;Moist Heat;Ultrasound;Therapeutic exercise;Therapeutic activities;Patient/family education;Manual techniques;Passive range of motion;Vasopneumatic Device;Taping;Dry needling       Patient will benefit from skilled therapeutic intervention in order to improve the following deficits and impairments:  Decreased activity tolerance, Decreased range of motion, Decreased strength, Impaired UE functional use, Pain  Visit Diagnosis: Acute pain of right shoulder  Stiffness of right shoulder, not elsewhere classified  Abnormal posture  Muscle weakness (generalized)     Problem List Patient Active Problem List   Diagnosis Date Noted  . Cervical disc disorder with radiculopathy of cervical region 04/25/2016  . Nonallopathic  lesion of cervical region 04/17/2016  . Nonallopathic lesion of thoracic region 04/17/2016  . Nonallopathic lesion of rib cage 04/17/2016  . Nonallopathic lesion of lumbosacral region 04/17/2016  . Degenerative cervical disc 03/29/2016  . Degenerative arthritis of right knee 03/29/2016  . Acute blood loss anemia   . UGIB (upper gastrointestinal bleed) 03/10/2016  . Upper GI bleed 03/10/2016  . 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 Abdominal pain, epigastric   . Adjustment disorder with depressed mood 07/14/2014  . Hx of colonic polyps   . 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 Chronic constipation 03/05/2013  . Coronary atherosclerosis of native coronary artery 08/27/2011  . History of uterine cancer 08/15/2011  . Hypercholesterolemia 08/15/2011  . History of colonic polyps 12/29/2009    MBess Harvest PTA 03/10/17 12:46 PM   [facility] [address] [place] [address]: [phone]  Fax:  [phone] Name: Sandra MURTAUGHMRN: [id number] of Birth: 709/10/1960

## 2017-03-12 DIAGNOSIS — S42024D Nondisplaced fracture of shaft of right clavicle, subsequent encounter for fracture with routine healing: Secondary | ICD-10-CM | POA: Diagnosis not present

## 2017-03-13 ENCOUNTER — Encounter: Payer: Self-pay | Admitting: Physical Therapy

## 2017-03-13 ENCOUNTER — Ambulatory Visit: Payer: 59 | Admitting: Physical Therapy

## 2017-03-13 DIAGNOSIS — M25611 Stiffness of right shoulder, not elsewhere classified: Secondary | ICD-10-CM | POA: Diagnosis not present

## 2017-03-13 DIAGNOSIS — M6281 Muscle weakness (generalized): Secondary | ICD-10-CM | POA: Diagnosis not present

## 2017-03-13 DIAGNOSIS — M25511 Pain in right shoulder: Secondary | ICD-10-CM | POA: Diagnosis not present

## 2017-03-13 DIAGNOSIS — R293 Abnormal posture: Secondary | ICD-10-CM | POA: Diagnosis not present

## 2017-03-13 NOTE — Therapy (Signed)
[facility] [address]  [place], [place], [address] Phone: [phone]   Fax:  [phone]  Physical Therapy Treatment  Patient Details  Name: Sandra [name] MRN: [id number] Date of Birth: July 04, 1960 Referring Provider: Dr. Mardelle Matte   Encounter Date: 03/13/2017  PT End of Session - 03/13/17 0857    Visit Number  24    Number of Visits  28    Date for PT Re-Evaluation  03/24/17    Authorization Type  [facility]    PT Start Time  0850    PT Stop Time  0933    PT Time Calculation (min)  43 min    Activity Tolerance  Patient tolerated treatment well    Behavior During Therapy  [facility] for tasks assessed/performed       Past Medical History:  Diagnosis Date  . Anxiety   . Arthritis   . CAD (coronary artery disease) NON-OBSTRUCTIVE PER DR HOCHREIN NOTE 08-26-2011  . Cancer [facility]) 2006   uterine  . Complication of anesthesia PT STATES HAS LETTER FROM [place] [facility]. STATING THAT SHE HAS A VERY SMALL AIRWAY (LAST SURG. HYSTERECTOMY AT [facility] IN 2006   PT STATES LYNN BEASON CRNA WHOM WORKS HERE TOLD SHE WOULD BE OKAY TO BE DONE AT [facility]  . Diabetes mellitus    no meds taken now, checks cbg once or twice a week   . 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 GERD (gastroesophageal reflux disease)   . Heart murmur    with last visit, MD states "outgrown" murmur  . Hemorrhoids, internal, with bleeding and Grade 2 prolapse 03/05/2013   All positions seen an anoscopy RP columns banded 03/05/2013 LL and RA banded 03/18/2013     . History of endometrial cancer 2006 ---- S/P ABD. HYSTERECTOMY   STAGE I ENDOMETROID CARCINOMA  . Hx of adenomatous colonic polyps 2011   BENIGN  . Hyperlipidemia    no meds now  . Hypertension   . Morbid obesity, weight - 203, BMI - 38.4 05/12/2013  . OSA on CPAP    cpap setting of 16  . Sleep apnea    no CPAP since surgery    Past Surgical History:  Procedure Laterality Date  . ABDOMINAL  HYSTERECTOMY  12-20-2004   TAH  . BILATERAL BREAST REDUCTION  11-16-1999  . BREATH TEK H PYLORI N/A 05/27/2013   Procedure: BREATH TEK H PYLORI;  Surgeon: Shann Medal, MD;  Location: [facility];  Service: General;  Laterality: N/A;  . [place]  . COLONOSCOPY    . COLONOSCOPY N/A 04/25/2014   Procedure: COLONOSCOPY;  Surgeon: Gatha Mayer, MD;  Location: [facility];  Service: Endoscopy;  Laterality: N/A;  . CYSTOSCOPY  09/06/2011   Procedure: CYSTOSCOPY;  Surgeon: Ailene Rud, MD;  Location: [facility];  Service: Urology;  Laterality: N/A;  LYNX SLING   . ESOPHAGOGASTRODUODENOSCOPY (EGD) WITH PROPOFOL N/A 09/27/2014   Procedure: ESOPHAGOGASTRODUODENOSCOPY (EGD) WITH PROPOFOL;  Surgeon: Gatha Mayer, MD;  Location: [facility];  Service: Endoscopy;  Laterality: N/A;  . ESOPHAGOGASTRODUODENOSCOPY (EGD) WITH PROPOFOL N/A 03/11/2016   Procedure: ESOPHAGOGASTRODUODENOSCOPY (EGD) WITH PROPOFOL;  Surgeon: Jerene Bears, MD;  Location: [facility];  Service: Endoscopy;  Laterality: N/A;  . GASTRIC ROUX-EN-Y N/A 10/04/2013   Procedure: LAPAROSCOPIC ROUX-EN-Y GASTRIC BYPASS WITH UPPER ENDOSCOPY;  Surgeon: Shann Medal, MD;  Location: [facility];  Service: General;  Laterality: N/A;  .  HEMORRHOID BANDING  02/2013  . PUBOVAGINAL SLING  09/06/2011   Procedure: Gaynelle Arabian;  Surgeon: Ailene Rud, MD;  Location: [facility];  Service: Urology;  Laterality: N/A;  . UVULOPALATOPHARYNGOPLASTY, TONSILLECTOMY AND SEPTOPLASTY  1994    There were no vitals filed for this visit.  Subjective Assessment - 03/13/17 0852    Subjective  doing well today - cant reahc behind back to take coat off    Pertinent History  gastric bypass    Diagnostic tests  Xray - normal healing    Patient Stated Goals  improve pain and function    Currently in Pain?  Yes    Pain Score  2     Pain Location  Shoulder    Pain Orientation  Right    Pain  Descriptors / Indicators  Throbbing    Pain Type  Acute pain                      [facility] Adult PT Treatment/Exercise - 03/13/17 0858      Shoulder Exercises: Standing   Row  Both;20 reps    Row Limitations  TRX     Other Standing Exercises  BATCA pulldown - 15# x 15 reps    Other Standing Exercises  cable chest press - 5# x 15      Shoulder Exercises: ROM/Strengthening   UBE (Upper Arm Bike)  L4 x 6 min (3/3)      Manual Therapy   Manual Therapy  Soft tissue mobilization    Manual therapy comments  patient supine    Soft tissue mobilization  STM to R UT, R deltoid, R bicep and R pec maj/min - tightness and TTP noted       Trigger Point Dry Needling - 03/13/17 1013    Consent Given?  Yes    Muscles Treated Upper Body  Upper trapezius;Pectoralis major bicep    Upper Trapezius Response  Twitch reponse elicited;Palpable increased muscle length    Pectoralis Major Response  Twitch response elicited;Palpable increased muscle length             PT Short Term Goals - 01/13/17 1408      PT SHORT TERM GOAL #1   Title  patient to be independent with initial HEP     Status  Achieved      PT SHORT TERM GOAL #2   Title  patient to improve R shoulder PROM equal to that of L shoulder without pain limiting     Status  On-going        PT Long Term Goals - 03/10/17 0910      PT LONG TERM GOAL #1   Title  patient to be independent with advanced HEP    Status  Partially Met met for current       PT LONG TERM GOAL #2   Title  patient to improve R shoulder AROM equal to that of L shoulder without pain limiting    Status  On-going      PT LONG TERM GOAL #3   Title  patient to improve gross R UE strength to >/= 4/5    Status  Achieved      PT LONG TERM GOAL #4   Title  patient to report ability to perform ADLs and household tasks without pain limiting function    Status  On-going      PT LONG TERM GOAL #5   Title  patient to  report ability to return to work at  full capacity.     Status  On-going            Plan - 03/13/17 0857    Clinical Impression Statement  Sandra [name] seen by MD yesterday - did have a fall yesterday into door frame, however, repeat xrays taken with appropriate healing noted. DN continued with excellent twitch response noted. Progressing well towards goals otherwise.     PT Treatment/Interventions  ADLs/Self Care Home Management;Cryotherapy;Electrical Stimulation;Iontophoresis 23m/ml Dexamethasone;Moist Heat;Ultrasound;Therapeutic exercise;Therapeutic activities;Patient/family education;Manual techniques;Passive range of motion;Vasopneumatic Device;Taping;Dry needling    Consulted and Agree with Plan of Care  Patient       Patient will benefit from skilled therapeutic intervention in order to improve the following deficits and impairments:  Decreased activity tolerance, Decreased range of motion, Decreased strength, Impaired UE functional use, Pain  Visit Diagnosis: Acute pain of right shoulder  Stiffness of right shoulder, not elsewhere classified  Abnormal posture  Muscle weakness (generalized)     Problem List Patient Active Problem List   Diagnosis Date Noted  . Cervical disc disorder with radiculopathy of cervical region 04/25/2016  . Nonallopathic lesion of cervical region 04/17/2016  . Nonallopathic lesion of thoracic region 04/17/2016  . Nonallopathic lesion of rib cage 04/17/2016  . Nonallopathic lesion of lumbosacral region 04/17/2016  . Degenerative cervical disc 03/29/2016  . Degenerative arthritis of right knee 03/29/2016  . Acute blood loss anemia   . UGIB (upper gastrointestinal bleed) 03/10/2016  . Upper GI bleed 03/10/2016  . 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 Abdominal pain, epigastric   . Adjustment disorder with depressed mood 07/14/2014  . Hx of colonic polyps   . 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 Chronic constipation 03/05/2013  . Coronary atherosclerosis of  native coronary artery 08/27/2011  . History of uterine cancer 08/15/2011  . Hypercholesterolemia 08/15/2011  . History of colonic polyps 12/29/2009     SLanney Gins PT, DPT 03/13/17 10:20 AM   [facility] [place] [address]Phone: [phone]  Fax:  [phone] Name: Sandra CASALINOMRN: [id number] of Birth: [birth year]

## 2017-03-17 ENCOUNTER — Ambulatory Visit: Payer: 59

## 2017-03-18 ENCOUNTER — Ambulatory Visit: Payer: 59

## 2017-03-18 DIAGNOSIS — R293 Abnormal posture: Secondary | ICD-10-CM

## 2017-03-18 DIAGNOSIS — M25611 Stiffness of right shoulder, not elsewhere classified: Secondary | ICD-10-CM

## 2017-03-18 DIAGNOSIS — M6281 Muscle weakness (generalized): Secondary | ICD-10-CM | POA: Diagnosis not present

## 2017-03-18 DIAGNOSIS — M25511 Pain in right shoulder: Secondary | ICD-10-CM | POA: Diagnosis not present

## 2017-03-18 NOTE — Therapy (Signed)
[facility] [address]  [place], [place], [address] Phone: [phone]   Fax:  [phone]  Physical Therapy Treatment  Patient Details  Name: Sandra [name] MRN: [id number] Date of Birth: [birth year] Referring Provider: Dr. Mardelle Matte   Encounter Date: 03/18/2017  PT End of Session - 03/18/17 1022    Visit Number  25    Number of Visits  28    Date for PT Re-Evaluation  03/24/17    Authorization Type  [facility]    PT Start Time  1016    PT Stop Time  1115    PT Time Calculation (min)  59 min    Activity Tolerance  Patient tolerated treatment well    Behavior During Therapy  [facility] for tasks assessed/performed       Past Medical History:  Diagnosis Date  . Anxiety   . Arthritis   . CAD (coronary artery disease) NON-OBSTRUCTIVE PER DR HOCHREIN NOTE 08-26-2011  . Cancer [facility]) 2006   uterine  . Complication of anesthesia PT STATES HAS LETTER FROM [place] [facility]. STATING THAT SHE HAS A VERY SMALL AIRWAY (LAST SURG. HYSTERECTOMY AT [facility] IN 2006   PT STATES LYNN BEASON CRNA WHOM WORKS HERE TOLD SHE WOULD BE OKAY TO BE DONE AT [facility]  . Diabetes mellitus    no meds taken now, checks cbg once or twice a week   . 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 GERD (gastroesophageal reflux disease)   . Heart murmur    with last visit, MD states "outgrown" murmur  . Hemorrhoids, internal, with bleeding and Grade 2 prolapse 03/05/2013   All positions seen an anoscopy RP columns banded 03/05/2013 LL and RA banded 03/18/2013     . History of endometrial cancer 2006 ---- S/P ABD. HYSTERECTOMY   STAGE I ENDOMETROID CARCINOMA  . Hx of adenomatous colonic polyps 2011   BENIGN  . Hyperlipidemia    no meds now  . Hypertension   . Morbid obesity, weight - 203, BMI - 38.4 05/12/2013  . OSA on CPAP    cpap setting of 16  . Sleep apnea    no CPAP since surgery    Past Surgical History:  Procedure Laterality Date  . ABDOMINAL  HYSTERECTOMY  12-20-2004   TAH  . BILATERAL BREAST REDUCTION  11-16-1999  . BREATH TEK H PYLORI N/A 05/27/2013   Procedure: BREATH TEK H PYLORI;  Surgeon: Shann Medal, MD;  [facility];  Service: General;  Laterality: N/A;  . [place]  . COLONOSCOPY    . COLONOSCOPY N/A 04/25/2014   Procedure: COLONOSCOPY;  Surgeon: Gatha Mayer, MD;  [facility];  Service: Endoscopy;  Laterality: N/A;  . CYSTOSCOPY  09/06/2011   Procedure: CYSTOSCOPY;  Surgeon: Ailene Rud, MD;  Location: [facility];  Service: Urology;  Laterality: N/A;  LYNX SLING   . ESOPHAGOGASTRODUODENOSCOPY (EGD) WITH PROPOFOL N/A 09/27/2014   Procedure: ESOPHAGOGASTRODUODENOSCOPY (EGD) WITH PROPOFOL;  Surgeon: Gatha Mayer, MD;  [facility];  Service: Endoscopy;  Laterality: N/A;  . ESOPHAGOGASTRODUODENOSCOPY (EGD) WITH PROPOFOL N/A 03/11/2016   Procedure: ESOPHAGOGASTRODUODENOSCOPY (EGD) WITH PROPOFOL;  Surgeon: Jerene Bears, MD;  [facility];  Service: Endoscopy;  Laterality: N/A;  . GASTRIC ROUX-EN-Y N/A 10/04/2013   Procedure: LAPAROSCOPIC ROUX-EN-Y GASTRIC BYPASS WITH UPPER ENDOSCOPY;  Surgeon: Shann Medal, MD;  [facility];  Service: General;  Laterality: N/A;  .  HEMORRHOID BANDING  02/2013  . PUBOVAGINAL SLING  09/06/2011   Procedure: Gaynelle Arabian;  Surgeon: Ailene Rud, MD;  Location: [facility];  Service: Urology;  Laterality: N/A;  . UVULOPALATOPHARYNGOPLASTY, TONSILLECTOMY AND SEPTOPLASTY  1994    There were no vitals filed for this visit.  Subjective Assessment - 03/18/17 1018    Subjective  Pt. reporting she rearranged living room, moving furniture, and assembling christmas tree on Saturday and was very sore for two nights following this.  Got good relief from DN last visit.      Pertinent History  gastric bypass    Diagnostic tests  Xray - normal healing    Patient Stated Goals  improve pain and  function    Currently in Pain?  Yes    Pain Score  3     Pain Location  Shoulder    Pain Orientation  Right    Pain Descriptors / Indicators  Throbbing    Pain Type  Acute pain    Pain Onset  More than a month ago    Pain Frequency  Constant    Aggravating Factors   putting coat on, moving furniture, reaching overhead    Pain Relieving Factors  tylenol     Multiple Pain Sites  No         [facility] PT Assessment - 03/18/17 1037      Assessment   Next MD Visit  12.12.18                   [facility] Adult PT Treatment/Exercise - 03/18/17 1051      Shoulder Exercises: Standing   Protraction  Both;20 reps    Protraction Limitations  on BOSU ball (down) leaning on mat table     Other Standing Exercises  BATCA pulldown - 20# x 15 reps      Shoulder Exercises: ROM/Strengthening   UBE (Upper Arm Bike)  L4 x 6 min (3/3)    Wall Pushups  15 reps    Wall Pushups Limitations  green p-ball       Shoulder Exercises: Stretch   Internal Rotation Stretch  1 rep    Internal Rotation Stretch Limitations  sleeper stretch     Wall Stretch - Flexion  2 reps;20 seconds    Wall Stretch - Flexion Limitations  R; ttp in posterior/inferior shoulder       Modalities   Modalities  Electrical Stimulation      Moist Heat Therapy   Number Minutes Moist Heat  15 Minutes    Moist Heat Location  Shoulder R shoulder and upper back musculature      Electrical Stimulation   Electrical Stimulation Location  R deltoid     Electrical Stimulation Action  IFC    Electrical Stimulation Parameters  80-150Hz , intensity to pt. tolerance, 15'     Electrical Stimulation Goals  Tone;Pain      Iontophoresis   Type of Iontophoresis  Dexamethasone    Location  R lateral shoulder    Dose  1.0 mL    Time  4-6 hours; 7m patch #6/6      Manual Therapy   Manual Therapy  Soft tissue mobilization;Myofascial release;Scapular mobilization;Joint mobilization    Manual therapy comments  patient supine    Joint  Mobilization  R shoulder posterior mob grade III for improved IR ROM     Soft tissue mobilization  STM to R UT, R deltoid, and R pec maj/min - tightness/TTP throughout  Myofascial Release  Manual TPR to R UT    Scapular Mobilization  R scapular mobs  - all directions to decrease guarding     Muscle Energy Technique  contract/relax stretching for R shoulder IR for improved ROM                PT Short Term Goals - 01/13/17 1408      PT SHORT TERM GOAL #1   Title  patient to be independent with initial HEP     Status  Achieved      PT SHORT TERM GOAL #2   Title  patient to improve R shoulder PROM equal to that of L shoulder without pain limiting     Status  On-going        PT Long Term Goals - 03/10/17 0910      PT LONG TERM GOAL #1   Title  patient to be independent with advanced HEP    Status  Partially Met met for current       PT LONG TERM GOAL #2   Title  patient to improve R shoulder AROM equal to that of L shoulder without pain limiting    Status  On-going      PT LONG TERM GOAL #3   Title  patient to improve gross R UE strength to >/= 4/5    Status  Achieved      PT LONG TERM GOAL #4   Title  patient to report ability to perform ADLs and household tasks without pain limiting function    Status  On-going      PT LONG TERM GOAL #5   Title  patient to report ability to return to work at full capacity.     Status  On-going            Plan - 03/18/17 1022    Clinical Impression Statement  [place] seen to start treatment reporting increased R shoulder "throbbing" pain over past two days since moving furniture in living room and assembling Christmas tree over weekend.  Pt. with increased tenderness throughout R upper shoulder musculature today and with palpable TP in R UT.  Tolerated TPR to UT well today however limited relief noted.  Some relief from general tenderness following STM.  Some focus on manual techniques and stretches into flexion, and IR today  as pt. still reporting difficulty reaching overhead and great difficulty donning/doffing coat.  Remaining time in treatment focused on scapular strengthening activities which were tolerated well.  Pt. reporting some previous relief following ionto patch thus ionto patch #6/6 applied to R lateral shoulder today.  Ended treatment with moist heat and trial of E-stim for hopeful decrease in pain/tone at shoulder complex.   Will continue toward goals.      PT Treatment/Interventions  ADLs/Self Care Home Management;Cryotherapy;Electrical Stimulation;Iontophoresis 60m/ml Dexamethasone;Moist Heat;Ultrasound;Therapeutic exercise;Therapeutic activities;Patient/family education;Manual techniques;Passive range of motion;Vasopneumatic Device;Taping;Dry needling       Patient will benefit from skilled therapeutic intervention in order to improve the following deficits and impairments:  Decreased activity tolerance, Decreased range of motion, Decreased strength, Impaired UE functional use, Pain  Visit Diagnosis: Acute pain of right shoulder  Stiffness of right shoulder, not elsewhere classified  Abnormal posture  Muscle weakness (generalized)     Problem List Patient Active Problem List   Diagnosis Date Noted  . Cervical disc disorder with radiculopathy of cervical region 04/25/2016  . Nonallopathic lesion of cervical region 04/17/2016  . Nonallopathic lesion of thoracic region 04/17/2016  .  Nonallopathic lesion of rib cage 04/17/2016  . Nonallopathic lesion of lumbosacral region 04/17/2016  . Degenerative cervical disc 03/29/2016  . Degenerative arthritis of right knee 03/29/2016  . Acute blood loss anemia   . UGIB (upper gastrointestinal bleed) 03/10/2016  . Upper GI bleed 03/10/2016  . 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 Abdominal pain, epigastric   . Adjustment disorder with depressed mood 07/14/2014  . Hx of colonic polyps   . 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  Chronic constipation 03/05/2013  . Coronary atherosclerosis of native coronary artery 08/27/2011  . History of uterine cancer 08/15/2011  . Hypercholesterolemia 08/15/2011  . History of colonic polyps 12/29/2009    Bess Harvest, PTA 03/18/17 8:51 PM  [facility] [address]  [place], [place], [address] Phone: [phone]   Fax:  [phone]  Name: Sandra [name] MRN: [id number] Date of Birth: [birth year]

## 2017-03-24 ENCOUNTER — Ambulatory Visit: Payer: 59 | Admitting: Physical Therapy

## 2017-03-27 ENCOUNTER — Encounter: Payer: Self-pay | Admitting: Physical Therapy

## 2017-03-27 ENCOUNTER — Ambulatory Visit: Payer: 59 | Admitting: Physical Therapy

## 2017-03-27 DIAGNOSIS — M25511 Pain in right shoulder: Secondary | ICD-10-CM | POA: Diagnosis not present

## 2017-03-27 DIAGNOSIS — M25611 Stiffness of right shoulder, not elsewhere classified: Secondary | ICD-10-CM

## 2017-03-27 DIAGNOSIS — R293 Abnormal posture: Secondary | ICD-10-CM

## 2017-03-27 DIAGNOSIS — M6281 Muscle weakness (generalized): Secondary | ICD-10-CM | POA: Diagnosis not present

## 2017-03-27 NOTE — Therapy (Signed)
[facility] [address]  [place], [place], [address] Phone: [phone]   Fax:  [phone]  Physical Therapy Treatment  Patient Details  Name: Sandra [name] MRN: [id number] Date of Birth: 1960/06/16 Referring Provider: Dr. Mardelle Matte   Encounter Date: 03/27/2017  PT End of Session - 03/27/17 0847    Visit Number  26    Number of Visits  32    Date for PT Re-Evaluation  04/24/17    Authorization Type  [facility]    PT Start Time  0846    PT Stop Time  0927    PT Time Calculation (min)  41 min    Activity Tolerance  Patient tolerated treatment well    Behavior During Therapy  [facility] for tasks assessed/performed       Past Medical History:  Diagnosis Date  . Anxiety   . Arthritis   . CAD (coronary artery disease) NON-OBSTRUCTIVE PER DR HOCHREIN NOTE 08-26-2011  . Cancer [facility]) 2006   uterine  . Complication of anesthesia PT STATES HAS LETTER FROM [place] [facility]. STATING THAT SHE HAS A VERY SMALL AIRWAY (LAST SURG. HYSTERECTOMY AT [facility] IN 2006   PT STATES LYNN BEASON CRNA WHOM WORKS HERE TOLD SHE WOULD BE OKAY TO BE DONE AT [facility]  . Diabetes mellitus    no meds taken now, checks cbg once or twice a week   . 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 GERD (gastroesophageal reflux disease)   . Heart murmur    with last visit, MD states "outgrown" murmur  . Hemorrhoids, internal, with bleeding and Grade 2 prolapse 03/05/2013   All positions seen an anoscopy RP columns banded 03/05/2013 LL and RA banded 03/18/2013     . History of endometrial cancer 2006 ---- S/P ABD. HYSTERECTOMY   STAGE I ENDOMETROID CARCINOMA  . Hx of adenomatous colonic polyps 2011   BENIGN  . Hyperlipidemia    no meds now  . Hypertension   . Morbid obesity, weight - 203, BMI - 38.4 05/12/2013  . OSA on CPAP    cpap setting of 16  . Sleep apnea    no CPAP since surgery    Past Surgical History:  Procedure Laterality Date  . ABDOMINAL  HYSTERECTOMY  12-20-2004   TAH  . BILATERAL BREAST REDUCTION  11-16-1999  . BREATH TEK H PYLORI N/A 05/27/2013   Procedure: BREATH TEK H PYLORI;  Surgeon: Shann Medal, MD;  Location: [facility];  Service: General;  Laterality: N/A;  . [place]  . COLONOSCOPY    . COLONOSCOPY N/A 04/25/2014   Procedure: COLONOSCOPY;  Surgeon: Gatha Mayer, MD;  Location: [facility];  Service: Endoscopy;  Laterality: N/A;  . CYSTOSCOPY  09/06/2011   Procedure: CYSTOSCOPY;  Surgeon: Ailene Rud, MD;  Location: [facility];  Service: Urology;  Laterality: N/A;  LYNX SLING   . ESOPHAGOGASTRODUODENOSCOPY (EGD) WITH PROPOFOL N/A 09/27/2014   Procedure: ESOPHAGOGASTRODUODENOSCOPY (EGD) WITH PROPOFOL;  Surgeon: Gatha Mayer, MD;  Location: [facility];  Service: Endoscopy;  Laterality: N/A;  . ESOPHAGOGASTRODUODENOSCOPY (EGD) WITH PROPOFOL N/A 03/11/2016   Procedure: ESOPHAGOGASTRODUODENOSCOPY (EGD) WITH PROPOFOL;  Surgeon: Jerene Bears, MD;  Location: [facility];  Service: Endoscopy;  Laterality: N/A;  . GASTRIC ROUX-EN-Y N/A 10/04/2013   Procedure: LAPAROSCOPIC ROUX-EN-Y GASTRIC BYPASS WITH UPPER ENDOSCOPY;  Surgeon: Shann Medal, MD;  Location: [facility];  Service: General;  Laterality: N/A;  .  HEMORRHOID BANDING  02/2013  . PUBOVAGINAL SLING  09/06/2011   Procedure: Gaynelle Arabian;  Surgeon: Ailene Rud, MD;  Location: [facility];  Service: Urology;  Laterality: N/A;  . UVULOPALATOPHARYNGOPLASTY, TONSILLECTOMY AND SEPTOPLASTY  1994    There were no vitals filed for this visit.  Subjective Assessment - 03/27/17 0847    Subjective  doing well - has been sick a few day    Diagnostic tests  Xray - normal healing    Patient Stated Goals  improve pain and function    Currently in Pain?  Yes    Pain Score  2     Pain Location  Shoulder    Pain Orientation  Right    Pain Descriptors / Indicators  Aching;Sore         [facility] PT  Assessment - 03/27/17 0001      AROM   AROM Assessment Site  Shoulder    Right/Left Shoulder  Right    Right Shoulder Flexion  142 Degrees    Right Shoulder ABduction  137 Degrees    Right Shoulder Internal Rotation  -- FIR~ sacrum    Right Shoulder External Rotation  -- FER ~C4                  [facility] Adult PT Treatment/Exercise - 03/27/17 0001      Shoulder Exercises: Prone   Flexion  AROM;Right;15 reps    External Rotation  Strengthening;Right;15 reps;Weights    External Rotation Weight (lbs)  1      Shoulder Exercises: Standing   Flexion  Strengthening;Both;15 reps;Weights    Shoulder Flexion Weight (lbs)  2    Flexion Limitations  B forward elevation    ABduction  Strengthening;Both;15 reps;Weights    Shoulder ABduction Weight (lbs)  2    Other Standing Exercises  upright row - B UE 4# - 15 reps      Shoulder Exercises: ROM/Strengthening   UBE (Upper Arm Bike)  L4 x 6 min (3/3)    Wall Pushups  15 reps    Wall Pushups Limitations  green p-ball     Other ROM/Strengthening Exercises  scapular PNF - R side only - 3 x 30 seconds    Other ROM/Strengthening Exercises  BATCA pull down 10# x 15 reps       Shoulder Exercises: Stretch   Other Shoulder Stretches  R sleeper stretch - 3 x 60 sec    Other Shoulder Stretches  B pec stretch over foam roller - PT assisted               PT Short Term Goals - 01/13/17 1408      PT SHORT TERM GOAL #1   Title  patient to be independent with initial HEP     Status  Achieved      PT SHORT TERM GOAL #2   Title  patient to improve R shoulder PROM equal to that of L shoulder without pain limiting     Status  On-going        PT Long Term Goals - 03/27/17 4268      PT LONG TERM GOAL #1   Title  patient to be independent with advanced HEP    Status  Achieved      PT LONG TERM GOAL #2   Title  patient to improve R shoulder AROM equal to that of L shoulder without pain limiting    Status  On-going  PT LONG  TERM GOAL #3   Title  patient to improve gross R UE strength to >/= 4/5    Status  Achieved      PT LONG TERM GOAL #4   Title  patient to report ability to perform ADLs and household tasks without pain limiting function    Status  On-going      PT LONG TERM GOAL #5   Title  patient to report ability to return to work at full capacity.     Status  On-going            Plan - 03/27/17 0939    Clinical Impression Statement  Sandra [name] doing well today - does report continued diffiuclty with fully reaching overhead, however does continue to demonstrate improvements in AROM. Patient doing well with all strengthening tasks and activities against gravity, with noted fatigue/weakness towards end of each set. Patient able to progress work simulated tasks well. Patient to follow-up with MD on 04/09/17, but will plan for continued treatment to progress towards return to work unitl that date and further advice from MD.    PT Treatment/Interventions  ADLs/Self Care Home Management;Cryotherapy;Electrical Stimulation;Iontophoresis 4mg /ml Dexamethasone;Moist Heat;Ultrasound;Therapeutic exercise;Therapeutic activities;Patient/family education;Manual techniques;Passive range of motion;Vasopneumatic Device;Taping;Dry needling    Consulted and Agree with Plan of Care  Patient       Patient will benefit from skilled therapeutic intervention in order to improve the following deficits and impairments:  Decreased activity tolerance, Decreased range of motion, Decreased strength, Impaired UE functional use, Pain  Visit Diagnosis: Acute pain of right shoulder - Plan: PT plan of care cert/re-cert  Stiffness of right shoulder, not elsewhere classified - Plan: PT plan of care cert/re-cert  Abnormal posture - Plan: PT plan of care cert/re-cert  Muscle weakness (generalized) - Plan: PT plan of care cert/re-cert     Problem List Patient Active Problem List   Diagnosis Date Noted  . Cervical disc disorder with  radiculopathy of cervical region 04/25/2016  . Nonallopathic lesion of cervical region 04/17/2016  . Nonallopathic lesion of thoracic region 04/17/2016  . Nonallopathic lesion of rib cage 04/17/2016  . Nonallopathic lesion of lumbosacral region 04/17/2016  . Degenerative cervical disc 03/29/2016  . Degenerative arthritis of right knee 03/29/2016  . Acute blood loss anemia   . UGIB (upper gastrointestinal bleed) 03/10/2016  . Upper GI bleed 03/10/2016  . 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 Abdominal pain, epigastric   . Adjustment disorder with depressed mood 07/14/2014  . Hx of colonic polyps   . 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 Chronic constipation 03/05/2013  . Coronary atherosclerosis of native coronary artery 08/27/2011  . History of uterine cancer 08/15/2011  . Hypercholesterolemia 08/15/2011  . History of colonic polyps 12/29/2009     Lanney Gins, PT, DPT 03/27/17 9:53 AM  [facility] [address]  [place], [place], [address] Phone: [phone]   Fax:  [phone]  Name: Sandra [name] MRN: [id number] Date of Birth: 29-Sep-1960

## 2017-03-31 ENCOUNTER — Ambulatory Visit: Payer: 59 | Attending: Orthopedic Surgery | Admitting: Physical Therapy

## 2017-03-31 ENCOUNTER — Encounter: Payer: Self-pay | Admitting: Physical Therapy

## 2017-03-31 DIAGNOSIS — M25511 Pain in right shoulder: Secondary | ICD-10-CM | POA: Diagnosis not present

## 2017-03-31 DIAGNOSIS — M6281 Muscle weakness (generalized): Secondary | ICD-10-CM | POA: Insufficient documentation

## 2017-03-31 DIAGNOSIS — R293 Abnormal posture: Secondary | ICD-10-CM | POA: Diagnosis not present

## 2017-03-31 DIAGNOSIS — M25611 Stiffness of right shoulder, not elsewhere classified: Secondary | ICD-10-CM | POA: Diagnosis not present

## 2017-03-31 NOTE — Therapy (Signed)
[facility] [address]  [place], [place], [address] Phone: [phone]   Fax:  [phone]  Physical Therapy Treatment  Patient Details  Name: Sandra [name] MRN: [id number] Date of Birth: 1960/07/15 Referring Provider: Dr. Mardelle Matte   Encounter Date: 03/31/2017  PT End of Session - 03/31/17 1010    Visit Number  27    Number of Visits  32    Date for PT Re-Evaluation  04/24/17    Authorization Type  [facility]    PT Start Time  0847    PT Stop Time  0929    PT Time Calculation (min)  42 min    Activity Tolerance  Patient tolerated treatment well    Behavior During Therapy  [facility] for tasks assessed/performed       Past Medical History:  Diagnosis Date  . Anxiety   . Arthritis   . CAD (coronary artery disease) NON-OBSTRUCTIVE PER DR HOCHREIN NOTE 08-26-2011  . Cancer [facility]) 2006   uterine  . Complication of anesthesia PT STATES HAS LETTER FROM [place] [facility]. STATING THAT SHE HAS A VERY SMALL AIRWAY (LAST SURG. HYSTERECTOMY AT [facility] IN 2006   PT STATES LYNN BEASON CRNA WHOM WORKS HERE TOLD SHE WOULD BE OKAY TO BE DONE AT [facility]  . Diabetes mellitus    no meds taken now, checks cbg once or twice a week   . 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 GERD (gastroesophageal reflux disease)   . Heart murmur    with last visit, MD states "outgrown" murmur  . Hemorrhoids, internal, with bleeding and Grade 2 prolapse 03/05/2013   All positions seen an anoscopy RP columns banded 03/05/2013 LL and RA banded 03/18/2013     . History of endometrial cancer 2006 ---- S/P ABD. HYSTERECTOMY   STAGE I ENDOMETROID CARCINOMA  . Hx of adenomatous colonic polyps 2011   BENIGN  . Hyperlipidemia    no meds now  . Hypertension   . Morbid obesity, weight - 203, BMI - 38.4 05/12/2013  . OSA on CPAP    cpap setting of 16  . Sleep apnea    no CPAP since surgery    Past Surgical History:  Procedure Laterality Date  . ABDOMINAL  HYSTERECTOMY  12-20-2004   TAH  . BILATERAL BREAST REDUCTION  11-16-1999  . BREATH TEK H PYLORI N/A 05/27/2013   Procedure: BREATH TEK H PYLORI;  Surgeon: Shann Medal, MD;  [facility];  Service: General;  Laterality: N/A;  . [place]  . COLONOSCOPY    . COLONOSCOPY N/A 04/25/2014   Procedure: COLONOSCOPY;  Surgeon: Gatha Mayer, MD;  Location[facility];  Service: Endoscopy;  Laterality: N/A;  . CYSTOSCOPY  09/06/2011   Procedure: CYSTOSCOPY;  Surgeon: Ailene Rud, MD;  Location: [facility];  Service: Urology;  Laterality: N/A;  LYNX SLING   . ESOPHAGOGASTRODUODENOSCOPY (EGD) WITH PROPOFOL N/A 09/27/2014   Procedure: ESOPHAGOGASTRODUODENOSCOPY (EGD) WITH PROPOFOL;  Surgeon: Gatha Mayer, MD;  [facility];  Service: Endoscopy;  Laterality: N/A;  . ESOPHAGOGASTRODUODENOSCOPY (EGD) WITH PROPOFOL N/A 03/11/2016   Procedure: ESOPHAGOGASTRODUODENOSCOPY (EGD) WITH PROPOFOL;  Surgeon: Jerene Bears, MD;  Location[facility];  Service: Endoscopy;  Laterality: N/A;  . GASTRIC ROUX-EN-Y N/A 10/04/2013   Procedure: LAPAROSCOPIC ROUX-EN-Y GASTRIC BYPASS WITH UPPER ENDOSCOPY;  Surgeon: Shann Medal, MD;  Location: [facility];  Service: General;  Laterality: N/A;  .  HEMORRHOID BANDING  02/2013  . PUBOVAGINAL SLING  09/06/2011   Procedure: Gaynelle Arabian;  Surgeon: Ailene Rud, MD;  Location: [facility];  Service: Urology;  Laterality: N/A;  . UVULOPALATOPHARYNGOPLASTY, TONSILLECTOMY AND SEPTOPLASTY  1994    There were no vitals filed for this visit.  Subjective Assessment - 03/31/17 0849    Subjective  Patient feeling good today, continues to have primary complaint of constant minor ache in front of shoulder.    Diagnostic tests  Xray - normal healing    Patient Stated Goals  improve pain and function    Currently in Pain?  Yes    Pain Score  2     Pain Location  Shoulder    Pain Orientation  Right     Pain Descriptors / Indicators  Aching    Pain Type  Acute pain                      [facility] Adult PT Treatment/Exercise - 03/31/17 0853      Shoulder Exercises: Prone   Other Prone Exercises  multidirectional resisted motion - red tband in quadruped; 15 reps    Other Prone Exercises  "I", "T", "Y" on green ball; 2#; 15 reps each VC for form with horizontal abduction      Shoulder Exercises: Standing   External Rotation  Right;15 reps    Theraband Level (Shoulder External Rotation)  Level 2 (Red)    External Rotation Limitations  90 deg abduction VC for form    Internal Rotation  Right;15 reps    Theraband Level (Shoulder Internal Rotation)  Level 2 (Red)    Internal Rotation Limitations  90 deg abduction VC for form    Other Standing Exercises  DB curl, press overhead; 15 reps; 3#      Shoulder Exercises: ROM/Strengthening   UBE (Upper Arm Bike)  L4 x 6 min (3/3)    Cybex Row  15 reps    Cybex Row Limitations  20# B    Other ROM/Strengthening Exercises  serratus roll up on foam; red tband on forearms; 15 reps    Other ROM/Strengthening Exercises  BATCA pull down 20# x 15 reps       Manual Therapy   Manual Therapy  Muscle Energy Technique;Passive ROM    Manual therapy comments  patient supine    Passive ROM  PROM with stretch at end range; flexion, IR, ER    Muscle Energy Technique  contract/relax stretching for R shoulder IR for improved ROM                PT Short Term Goals - 01/13/17 1408      PT SHORT TERM GOAL #1   Title  patient to be independent with initial HEP     Status  Achieved      PT SHORT TERM GOAL #2   Title  patient to improve R shoulder PROM equal to that of L shoulder without pain limiting     Status  On-going        PT Long Term Goals - 03/27/17 2778      PT LONG TERM GOAL #1   Title  patient to be independent with advanced HEP    Status  Achieved      PT LONG TERM GOAL #2   Title  patient to improve R shoulder AROM equal  to that of L shoulder without pain limiting    Status  On-going  PT LONG TERM GOAL #3   Title  patient to improve gross R UE strength to >/= 4/5    Status  Achieved      PT LONG TERM GOAL #4   Title  patient to report ability to perform ADLs and household tasks without pain limiting function    Status  On-going      PT LONG TERM GOAL #5   Title  patient to report ability to return to work at full capacity.     Status  On-going            Plan - 03/31/17 1011    Clinical Impression Statement  Patient progressing well with shoulder ROM and strength. Continues to have difficulty with overhead motions and internal rotation with pain being primary limiting factor. Pain continues to be localized to anterior deltoid and AC joint area. Will continue to progress to patient tolerance.     PT Treatment/Interventions  ADLs/Self Care Home Management;Cryotherapy;Electrical Stimulation;Iontophoresis 4mg /ml Dexamethasone;Moist Heat;Ultrasound;Therapeutic exercise;Therapeutic activities;Patient/family education;Manual techniques;Passive range of motion;Vasopneumatic Device;Taping;Dry needling    Consulted and Agree with Plan of Care  Patient       Patient will benefit from skilled therapeutic intervention in order to improve the following deficits and impairments:  Decreased activity tolerance, Decreased range of motion, Decreased strength, Impaired UE functional use, Pain  Visit Diagnosis: Acute pain of right shoulder  Stiffness of right shoulder, not elsewhere classified  Abnormal posture  Muscle weakness (generalized)     Problem List Patient Active Problem List   Diagnosis Date Noted  . Cervical disc disorder with radiculopathy of cervical region 04/25/2016  . Nonallopathic lesion of cervical region 04/17/2016  . Nonallopathic lesion of thoracic region 04/17/2016  . Nonallopathic lesion of rib cage 04/17/2016  . Nonallopathic lesion of lumbosacral region 04/17/2016  .  Degenerative cervical disc 03/29/2016  . Degenerative arthritis of right knee 03/29/2016  . Acute blood loss anemia   . UGIB (upper gastrointestinal bleed) 03/10/2016  . Upper GI bleed 03/10/2016  . 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 Abdominal pain, epigastric   . Adjustment disorder with depressed mood 07/14/2014  . Hx of colonic polyps   . 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 Chronic constipation 03/05/2013  . Coronary atherosclerosis of native coronary artery 08/27/2011  . History of uterine cancer 08/15/2011  . Hypercholesterolemia 08/15/2011  . History of colonic polyps 12/29/2009     Mickle Asper, SPT 03/31/17 11:35 AM    [facility] [address]  [place], [place], [address] Phone: [phone]   Fax:  [phone]  Name: Sandra [name] MRN: [id number] Date of Birth: 1960/10/24

## 2017-04-03 ENCOUNTER — Ambulatory Visit: Payer: 59 | Admitting: Physical Therapy

## 2017-04-07 ENCOUNTER — Ambulatory Visit: Payer: 59 | Admitting: Physical Therapy

## 2017-04-09 DIAGNOSIS — S42024D Nondisplaced fracture of shaft of right clavicle, subsequent encounter for fracture with routine healing: Secondary | ICD-10-CM | POA: Diagnosis not present

## 2017-04-10 ENCOUNTER — Ambulatory Visit: Payer: 59 | Admitting: Physical Therapy

## 2017-04-10 ENCOUNTER — Encounter: Payer: Self-pay | Admitting: Physical Therapy

## 2017-04-10 DIAGNOSIS — R293 Abnormal posture: Secondary | ICD-10-CM

## 2017-04-10 DIAGNOSIS — M25611 Stiffness of right shoulder, not elsewhere classified: Secondary | ICD-10-CM | POA: Diagnosis not present

## 2017-04-10 DIAGNOSIS — M25511 Pain in right shoulder: Secondary | ICD-10-CM

## 2017-04-10 DIAGNOSIS — M6281 Muscle weakness (generalized): Secondary | ICD-10-CM | POA: Diagnosis not present

## 2017-04-10 NOTE — Therapy (Signed)
[facility] [address]  [place], [place], [address] Phone: [phone]   Fax:  [phone]  Physical Therapy Treatment  Patient Details  Name: Sandra [name] MRN: [id number] Date of Birth: 07-Feb-1961 Referring Provider: Dr. Mardelle Matte   Encounter Date: 04/10/2017  PT End of Session - 04/10/17 1038    Visit Number  28    Number of Visits  32    Date for PT Re-Evaluation  04/24/17    Authorization Type  [facility]    PT Start Time  0841    PT Stop Time  0930    PT Time Calculation (min)  49 min    Activity Tolerance  Patient tolerated treatment well    Behavior During Therapy  [facility] for tasks assessed/performed       Past Medical History:  Diagnosis Date  . Anxiety   . Arthritis   . CAD (coronary artery disease) NON-OBSTRUCTIVE PER DR HOCHREIN NOTE 08-26-2011  . Cancer [facility]) 2006   uterine  . Complication of anesthesia PT STATES HAS LETTER FROM [place] [facility]. STATING THAT SHE HAS A VERY SMALL AIRWAY (LAST SURG. HYSTERECTOMY AT [facility] IN 2006   PT STATES LYNN BEASON CRNA WHOM WORKS HERE TOLD SHE WOULD BE OKAY TO BE DONE AT [facility]  . Diabetes mellitus    no meds taken now, checks cbg once or twice a week   . 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 GERD (gastroesophageal reflux disease)   . Heart murmur    with last visit, MD states "outgrown" murmur  . Hemorrhoids, internal, with bleeding and Grade 2 prolapse 03/05/2013   All positions seen an anoscopy RP columns banded 03/05/2013 LL and RA banded 03/18/2013     . History of endometrial cancer 2006 ---- S/P ABD. HYSTERECTOMY   STAGE I ENDOMETROID CARCINOMA  . Hx of adenomatous colonic polyps 2011   BENIGN  . Hyperlipidemia    no meds now  . Hypertension   . Morbid obesity, weight - 203, BMI - 38.4 05/12/2013  . OSA on CPAP    cpap setting of 16  . Sleep apnea    no CPAP since surgery    Past Surgical History:  Procedure Laterality Date  . ABDOMINAL  HYSTERECTOMY  12-20-2004   TAH  . BILATERAL BREAST REDUCTION  11-16-1999  . BREATH TEK H PYLORI N/A 05/27/2013   Procedure: BREATH TEK H PYLORI;  Surgeon: Shann Medal, MD;  Location: [facility];  Service: General;  Laterality: N/A;  . [place]  . COLONOSCOPY    . COLONOSCOPY N/A 04/25/2014   Procedure: COLONOSCOPY;  Surgeon: Gatha Mayer, MD;  Location: [facility];  Service: Endoscopy;  Laterality: N/A;  . CYSTOSCOPY  09/06/2011   Procedure: CYSTOSCOPY;  Surgeon: Ailene Rud, MD;  Location: [facility];  Service: Urology;  Laterality: N/A;  LYNX SLING   . ESOPHAGOGASTRODUODENOSCOPY (EGD) WITH PROPOFOL N/A 09/27/2014   Procedure: ESOPHAGOGASTRODUODENOSCOPY (EGD) WITH PROPOFOL;  Surgeon: Gatha Mayer, MD;  [facility]: [facility];  Service: Endoscopy;  Laterality: N/A;  . ESOPHAGOGASTRODUODENOSCOPY (EGD) WITH PROPOFOL N/A 03/11/2016   Procedure: ESOPHAGOGASTRODUODENOSCOPY (EGD) WITH PROPOFOL;  Surgeon: Jerene Bears, MD;  Location: [facility];  Service: Endoscopy;  Laterality: N/A;  . GASTRIC ROUX-EN-Y N/A 10/04/2013   Procedure: LAPAROSCOPIC ROUX-EN-Y GASTRIC BYPASS WITH UPPER ENDOSCOPY;  Surgeon: Shann Medal, MD;  Location: [facility];  Service: General;  Laterality: N/A;  .  HEMORRHOID BANDING  02/2013  . PUBOVAGINAL SLING  09/06/2011   Procedure: Gaynelle Arabian;  Surgeon: Ailene Rud, MD;  Location: [facility];  Service: Urology;  Laterality: N/A;  . UVULOPALATOPHARYNGOPLASTY, TONSILLECTOMY AND SEPTOPLASTY  1994    There were no vitals filed for this visit.  Subjective Assessment - 04/10/17 0844    Subjective  Patient will be returning to work on Jan. 7 and was advised to continue PT until then. Has been aching more over the last week.    Pertinent History  gastric bypass    Diagnostic tests  Xray - normal healing    Patient Stated Goals  improve pain and function    Currently in Pain?  Yes    Pain Score  2      Pain Location  Shoulder    Pain Orientation  Right    Pain Descriptors / Indicators  Aching                      [facility] Adult PT Treatment/Exercise - 04/10/17 0843      Shoulder Exercises: Sidelying   External Rotation  Right;15 reps    External Rotation Weight (lbs)  2    External Rotation Limitations  towel roll     Internal Rotation  Right;15 reps;Theraband    Theraband Level (Shoulder Internal Rotation)  Level 2 (Red)      Shoulder Exercises: ROM/Strengthening   UBE (Upper Arm Bike)  L4 x 6 min (3/3)    Other ROM/Strengthening Exercises  ABCs with red med ball; 1 set in flex, 1 set in abd; pt sitting      Manual Therapy   Manual Therapy  Joint mobilization;Passive ROM    Manual therapy comments  patient supine    Joint Mobilization  R shoulder, AP/PA glides; grade III for improved ER/IR ROM    Passive ROM  PROM with stretch at end range; flexion, IR, ER               PT Short Term Goals - 01/13/17 1408      PT SHORT TERM GOAL #1   Title  patient to be independent with initial HEP     Status  Achieved      PT SHORT TERM GOAL #2   Title  patient to improve R shoulder PROM equal to that of L shoulder without pain limiting     Status  On-going        PT Long Term Goals - 03/27/17 9470      PT LONG TERM GOAL #1   Title  patient to be independent with advanced HEP    Status  Achieved      PT LONG TERM GOAL #2   Title  patient to improve R shoulder AROM equal to that of L shoulder without pain limiting    Status  On-going      PT LONG TERM GOAL #3   Title  patient to improve gross R UE strength to >/= 4/5    Status  Achieved      PT LONG TERM GOAL #4   Title  patient to report ability to perform ADLs and household tasks without pain limiting function    Status  On-going      PT LONG TERM GOAL #5   Title  patient to report ability to return to work at full capacity.     Status  On-going  Plan - 04/10/17 1039    Clinical  Impression Statement  Patient progressing well, continuing to have pain in anterior R shoulder with most movements. Worked on R shoulder endurance during treatment session today. Patient planning to return to work at the beginning of next month, so will plan to continue focus on improving overhead ROM and general shoulder strength.     PT Treatment/Interventions  ADLs/Self Care Home Management;Cryotherapy;Electrical Stimulation;Iontophoresis 4mg /ml Dexamethasone;Moist Heat;Ultrasound;Therapeutic exercise;Therapeutic activities;Patient/family education;Manual techniques;Passive range of motion;Vasopneumatic Device;Taping;Dry needling    Consulted and Agree with Plan of Care  Patient       Patient will benefit from skilled therapeutic intervention in order to improve the following deficits and impairments:  Decreased activity tolerance, Decreased range of motion, Decreased strength, Impaired UE functional use, Pain  Visit Diagnosis: Acute pain of right shoulder  Stiffness of right shoulder, not elsewhere classified  Abnormal posture  Muscle weakness (generalized)     Problem List Patient Active Problem List   Diagnosis Date Noted  . Cervical disc disorder with radiculopathy of cervical region 04/25/2016  . Nonallopathic lesion of cervical region 04/17/2016  . Nonallopathic lesion of thoracic region 04/17/2016  . Nonallopathic lesion of rib cage 04/17/2016  . Nonallopathic lesion of lumbosacral region 04/17/2016  . Degenerative cervical disc 03/29/2016  . Degenerative arthritis of right knee 03/29/2016  . Acute blood loss anemia   . UGIB (upper gastrointestinal bleed) 03/10/2016  . Upper GI bleed 03/10/2016  . 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 Abdominal pain, epigastric   . Adjustment disorder with depressed mood 07/14/2014  . Hx of colonic polyps   . 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 Chronic constipation 03/05/2013  . Coronary atherosclerosis of native  coronary artery 08/27/2011  . History of uterine cancer 08/15/2011  . Hypercholesterolemia 08/15/2011  . History of colonic polyps 12/29/2009     Mickle Asper, SPT 04/10/17 10:45 AM    [facility] [address]  [place], [place], [address] Phone: [phone]   Fax:  [phone]  Name: Sandra [name] MRN: [id number] Date of Birth: Mar 13, 1961

## 2017-04-15 ENCOUNTER — Ambulatory Visit: Payer: 59

## 2017-04-15 DIAGNOSIS — M25511 Pain in right shoulder: Secondary | ICD-10-CM | POA: Diagnosis not present

## 2017-04-15 DIAGNOSIS — R293 Abnormal posture: Secondary | ICD-10-CM | POA: Diagnosis not present

## 2017-04-15 DIAGNOSIS — M25611 Stiffness of right shoulder, not elsewhere classified: Secondary | ICD-10-CM

## 2017-04-15 DIAGNOSIS — M6281 Muscle weakness (generalized): Secondary | ICD-10-CM

## 2017-04-15 NOTE — Therapy (Signed)
[facility] [address]  [place], [place], [address] Phone: [phone]   Fax:  [phone]  Physical Therapy Treatment  Patient Details  Name: CAYENNE BREAULT MRN: [id number] Date of Birth: [birth year] Referring Provider: Dr. Mardelle Matte   Encounter Date: 04/15/2017  PT End of Session - 04/15/17 0936    Visit Number  29    Number of Visits  32    Date for PT Re-Evaluation  04/24/17    Authorization Type  [facility]    PT Start Time  0929    PT Stop Time  1024    PT Time Calculation (min)  55 min    Activity Tolerance  Patient tolerated treatment well    Behavior During Therapy  [facility] for tasks assessed/performed       Past Medical History:  Diagnosis Date  . Anxiety   . Arthritis   . CAD (coronary artery disease) NON-OBSTRUCTIVE PER DR HOCHREIN NOTE 08-26-2011  . Cancer [facility]) 2006   uterine  . Complication of anesthesia PT STATES HAS LETTER FROM [place] [facility]. STATING THAT SHE HAS A VERY SMALL AIRWAY (LAST SURG. HYSTERECTOMY AT [facility] IN 2006   PT STATES LYNN BEASON CRNA WHOM WORKS HERE TOLD SHE WOULD BE OKAY TO BE DONE AT [facility]  . Diabetes mellitus    no meds taken now, checks cbg once or twice a week   . 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 GERD (gastroesophageal reflux disease)   . Heart murmur    with last visit, MD states "outgrown" murmur  . Hemorrhoids, internal, with bleeding and Grade 2 prolapse 03/05/2013   All positions seen an anoscopy RP columns banded 03/05/2013 LL and RA banded 03/18/2013     . History of endometrial cancer 2006 ---- S/P ABD. HYSTERECTOMY   STAGE I ENDOMETROID CARCINOMA  . Hx of adenomatous colonic polyps 2011   BENIGN  . Hyperlipidemia    no meds now  . Hypertension   . Morbid obesity, weight - 203, BMI - 38.4 05/12/2013  . OSA on CPAP    cpap setting of 16  . Sleep apnea    no CPAP since surgery    Past Surgical History:  Procedure Laterality Date  . ABDOMINAL  HYSTERECTOMY  12-20-2004   TAH  . BILATERAL BREAST REDUCTION  11-16-1999  . BREATH TEK H PYLORI N/A 05/27/2013   Procedure: BREATH TEK H PYLORI;  Surgeon: Shann Medal, MD;  [facility];  Service: General;  Laterality: N/A;  . [place]  . COLONOSCOPY    . COLONOSCOPY N/A 04/25/2014   Procedure: COLONOSCOPY;  Surgeon: Gatha Mayer, MD;  [facility];  Service: Endoscopy;  Laterality: N/A;  . CYSTOSCOPY  09/06/2011   Procedure: CYSTOSCOPY;  Surgeon: Ailene Rud, MD;  Location: [facility];  Service: Urology;  Laterality: N/A;  LYNX SLING   . ESOPHAGOGASTRODUODENOSCOPY (EGD) WITH PROPOFOL N/A 09/27/2014   Procedure: ESOPHAGOGASTRODUODENOSCOPY (EGD) WITH PROPOFOL;  Surgeon: Gatha Mayer, MD;  [facility];  Service: Endoscopy;  Laterality: N/A;  . ESOPHAGOGASTRODUODENOSCOPY (EGD) WITH PROPOFOL N/A 03/11/2016   Procedure: ESOPHAGOGASTRODUODENOSCOPY (EGD) WITH PROPOFOL;  Surgeon: Jerene Bears, MD;  [facility];  Service: Endoscopy;  Laterality: N/A;  . GASTRIC ROUX-EN-Y N/A 10/04/2013   Procedure: LAPAROSCOPIC ROUX-EN-Y GASTRIC BYPASS WITH UPPER ENDOSCOPY;  Surgeon: Shann Medal, MD;  [facility];  Service: General;  Laterality: N/A;  .  HEMORRHOID BANDING  02/2013  . PUBOVAGINAL SLING  09/06/2011   Procedure: Gaynelle Arabian;  Surgeon: Ailene Rud, MD;  [facility]: [facility];  Service: Urology;  Laterality: N/A;  . UVULOPALATOPHARYNGOPLASTY, TONSILLECTOMY AND SEPTOPLASTY  1994    There were no vitals filed for this visit.  Subjective Assessment - 04/15/17 0931    Subjective  Pt. reports benefit from DN last visit feeling "looser".      Diagnostic tests  Xray - normal healing    Patient Stated Goals  improve pain and function    Currently in Pain?  Yes    Pain Score  2     Pain Location  Shoulder    Pain Orientation  Right    Pain Descriptors / Indicators  Aching    Pain Type   Acute pain    Pain Onset  More than a month ago    Pain Frequency  Constant    Aggravating Factors   Overhead reaching, reaching behind back     Multiple Pain Sites  No                      [facility] Adult PT Treatment/Exercise - 04/15/17 0933      Shoulder Exercises: Standing   Protraction  Both;20 reps    Protraction Limitations  on wall in 30 dg pushup position     External Rotation  Right;15 reps    Theraband Level (Shoulder External Rotation)  Level 2 (Red)    External Rotation Limitations  90 deg abduction tactile cueing to maintain proper position    Internal Rotation  Right;15 reps    Theraband Level (Shoulder Internal Rotation)  Level 2 (Red)    Internal Rotation Limitations  90 deg abduction tactile cueing for pt. to maintain proper position    Flexion  Strengthening;Both;15 reps;Weights    Shoulder Flexion Weight (lbs)  2    Flexion Limitations  leaning on 6" bolster on wall     ABduction  Strengthening;Both;15 reps;Weights    Shoulder ABduction Weight (lbs)  2    ABduction Limitations  scaption; leaning into 6" bolster on wall     Row  Both;20 reps    Row Limitations  TRX; tactile cues for scapular retraction before each rep       Shoulder Exercises: ROM/Strengthening   UBE (Upper Arm Bike)  L4 x 6 min (3/3)    Other ROM/Strengthening Exercises  Side<>side walk in pushup position with red TB at wrists 3 laps 15 ft       Shoulder Exercises: Stretch   Corner Stretch  2 reps;60 seconds    Corner Stretch Limitations  Supine on pool noodle with overpressure from therapist     Internal Rotation Stretch  5 reps    Internal Rotation Stretch Limitations  5" hold; golf club     External Rotation Stretch  5 reps;10 seconds    External Rotation Stretch Limitations  with golf club       Shoulder Exercises: Body Blade   Flexion  45 seconds;2 reps    Flexion Limitations  B 90dg - to pt. tolerance in end range flexion     External Rotation  60 seconds;1 rep     External Rotation Limitations  neutral elbow     Internal Rotation  --    Other Body Blade Exercises  --      Moist Heat Therapy   Number Minutes Moist Heat  10 Minutes  Moist Heat Location  Shoulder R      Manual Therapy   Manual Therapy  Joint mobilization;Passive ROM;Soft tissue mobilization    Manual therapy comments  patient supine    Joint Mobilization  R shoulder, AP/PA glides; grade III for improved ER/IR ROM    Soft tissue mobilization  STM to R anterior shoulder over area of reported tenderness     Passive ROM  PROM with stretch with overpressure at end range; flexion, abduction IR, ER    Muscle Energy Technique  contract/relax stretching for R shoulder IR, ER, fleixon, abduction for improved ROM; visible improvement in ROM with pt. verbalizing this as well                PT Short Term Goals - 01/13/17 1408      PT SHORT TERM GOAL #1   Title  patient to be independent with initial HEP     Status  Achieved      PT SHORT TERM GOAL #2   Title  patient to improve R shoulder PROM equal to that of L shoulder without pain limiting     Status  On-going        PT Long Term Goals - 03/27/17 3664      PT LONG TERM GOAL #1   Title  patient to be independent with advanced HEP    Status  Achieved      PT LONG TERM GOAL #2   Title  patient to improve R shoulder AROM equal to that of L shoulder without pain limiting    Status  On-going      PT LONG TERM GOAL #3   Title  patient to improve gross R UE strength to >/= 4/5    Status  Achieved      PT LONG TERM GOAL #4   Title  patient to report ability to perform ADLs and household tasks without pain limiting function    Status  On-going      PT LONG TERM GOAL #5   Title  patient to report ability to return to work at full capacity.     Status  On-going            Plan - 04/15/17 0936    Clinical Impression Statement  Merleen doing well today reporting typical anterior shoulder soreness to start treatment.   Focused manual stretching with overpressure, and contract/relax stretching with pt. today with visible improvement in ROM and decrease in guarding.  Still reports her greatest limitation is reaching behind back and washing back of head in shower.  Able to demo improved AROM with overhead elevation activities today and confirming these type of activities will be primary demand upon starting back to work on January 7th.  Will continue to progress as pt. able in coming visits.       PT Treatment/Interventions  ADLs/Self Care Home Management;Cryotherapy;Electrical Stimulation;Iontophoresis 4mg /ml Dexamethasone;Moist Heat;Ultrasound;Therapeutic exercise;Therapeutic activities;Patient/family education;Manual techniques;Passive range of motion;Vasopneumatic Device;Taping;Dry needling    Consulted and Agree with Plan of Care  Patient       Patient will benefit from skilled therapeutic intervention in order to improve the following deficits and impairments:  Decreased activity tolerance, Decreased range of motion, Decreased strength, Impaired UE functional use, Pain  Visit Diagnosis: Acute pain of right shoulder  Stiffness of right shoulder, not elsewhere classified  Abnormal posture  Muscle weakness (generalized)     Problem List Patient Active Problem List   Diagnosis Date Noted  . Cervical  disc disorder with radiculopathy of cervical region 04/25/2016  . Nonallopathic lesion of cervical region 04/17/2016  . Nonallopathic lesion of thoracic region 04/17/2016  . Nonallopathic lesion of rib cage 04/17/2016  . Nonallopathic lesion of lumbosacral region 04/17/2016  . Degenerative cervical disc 03/29/2016  . Degenerative arthritis of right knee 03/29/2016  . Acute blood loss anemia   . UGIB (upper gastrointestinal bleed) 03/10/2016  . Upper GI bleed 03/10/2016  . 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 Abdominal pain, epigastric   . Adjustment disorder with depressed mood 07/14/2014  . Hx of  colonic polyps   . 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 Chronic constipation 03/05/2013  . Coronary atherosclerosis of native coronary artery 08/27/2011  . History of uterine cancer 08/15/2011  . Hypercholesterolemia 08/15/2011  . History of colonic polyps 12/29/2009    Bess Harvest, PTA 04/15/17 12:41 PM  [facility] [address]  [place], [place], [address] Phone: [phone]   Fax:  [phone]  Name: RHODA WALDVOGEL MRN: [id number] Date of Birth: 04-01-1961

## 2017-04-17 ENCOUNTER — Ambulatory Visit: Payer: 59 | Admitting: Physical Therapy

## 2017-04-23 ENCOUNTER — Ambulatory Visit: Payer: 59

## 2017-04-25 ENCOUNTER — Ambulatory Visit: Payer: 59 | Admitting: Physical Therapy

## 2017-04-30 ENCOUNTER — Encounter: Payer: Self-pay | Admitting: Physical Therapy

## 2017-04-30 ENCOUNTER — Ambulatory Visit: Payer: 59 | Attending: Orthopedic Surgery | Admitting: Physical Therapy

## 2017-04-30 DIAGNOSIS — M25611 Stiffness of right shoulder, not elsewhere classified: Secondary | ICD-10-CM | POA: Diagnosis not present

## 2017-04-30 DIAGNOSIS — M25511 Pain in right shoulder: Secondary | ICD-10-CM | POA: Diagnosis not present

## 2017-04-30 DIAGNOSIS — M6281 Muscle weakness (generalized): Secondary | ICD-10-CM | POA: Diagnosis not present

## 2017-04-30 DIAGNOSIS — R293 Abnormal posture: Secondary | ICD-10-CM | POA: Insufficient documentation

## 2017-04-30 NOTE — Therapy (Signed)
[facility] [address]  [place], [place], [address] Phone: [phone]   Fax:  [phone]  Physical Therapy Treatment  Patient Details  Name: Sandra [name] MRN: [id number] Date of Birth: 08-26-1960 Referring Provider: Dr. Mardelle Matte   Encounter Date: 04/30/2017  PT End of Session - 04/30/17 0936    Visit Number  30    Number of Visits  32    Date for PT Re-Evaluation  04/24/17    Authorization Type  [facility]    PT Start Time  0934    PT Stop Time  1015    PT Time Calculation (min)  41 min    Activity Tolerance  Patient tolerated treatment well    Behavior During Therapy  [facility] for tasks assessed/performed       Past Medical History:  Diagnosis Date  . Anxiety   . Arthritis   . CAD (coronary artery disease) NON-OBSTRUCTIVE PER DR HOCHREIN NOTE 08-26-2011  . Cancer [facility]) 2006   uterine  . Complication of anesthesia PT STATES HAS LETTER FROM [place] [facility]. STATING THAT SHE HAS A VERY SMALL AIRWAY (LAST SURG. HYSTERECTOMY AT [facility] IN 2006   PT STATES LYNN BEASON CRNA WHOM WORKS HERE TOLD SHE WOULD BE OKAY TO BE DONE AT [facility]  . Diabetes mellitus    no meds taken now, checks cbg once or twice a week   . 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 GERD (gastroesophageal reflux disease)   . Heart murmur    with last visit, MD states "outgrown" murmur  . Hemorrhoids, internal, with bleeding and Grade 2 prolapse 03/05/2013   All positions seen an anoscopy RP columns banded 03/05/2013 LL and RA banded 03/18/2013     . History of endometrial cancer 2006 ---- S/P ABD. HYSTERECTOMY   STAGE I ENDOMETROID CARCINOMA  . Hx of adenomatous colonic polyps 2011   BENIGN  . Hyperlipidemia    no meds now  . Hypertension   . Morbid obesity, weight - 203, BMI - 38.4 05/12/2013  . OSA on CPAP    cpap setting of 16  . Sleep apnea    no CPAP since surgery    Past Surgical History:  Procedure Laterality Date  . ABDOMINAL  HYSTERECTOMY  12-20-2004   TAH  . BILATERAL BREAST REDUCTION  11-16-1999  . BREATH TEK H PYLORI N/A 05/27/2013   Procedure: BREATH TEK H PYLORI;  Surgeon: Shann Medal, MD;  Location: [facility];  Service: General;  Laterality: N/A;  . [place]  . COLONOSCOPY    . COLONOSCOPY N/A 04/25/2014   Procedure: COLONOSCOPY;  Surgeon: Gatha Mayer, MD;  Location: [facility];  Service: Endoscopy;  Laterality: N/A;  . CYSTOSCOPY  09/06/2011   Procedure: CYSTOSCOPY;  Surgeon: Ailene Rud, MD;  Location: [facility];  Service: Urology;  Laterality: N/A;  LYNX SLING   . ESOPHAGOGASTRODUODENOSCOPY (EGD) WITH PROPOFOL N/A 09/27/2014   Procedure: ESOPHAGOGASTRODUODENOSCOPY (EGD) WITH PROPOFOL;  Surgeon: Gatha Mayer, MD;  [facility];  Service: Endoscopy;  Laterality: N/A;  . ESOPHAGOGASTRODUODENOSCOPY (EGD) WITH PROPOFOL N/A 03/11/2016   Procedure: ESOPHAGOGASTRODUODENOSCOPY (EGD) WITH PROPOFOL;  Surgeon: Jerene Bears, MD;  [facility];  Service: Endoscopy;  Laterality: N/A;  . GASTRIC ROUX-EN-Y N/A 10/04/2013   Procedure: LAPAROSCOPIC ROUX-EN-Y GASTRIC BYPASS WITH UPPER ENDOSCOPY;  Surgeon: Shann Medal, MD;  [facility];  Service: General;  Laterality: N/A;  .  HEMORRHOID BANDING  02/2013  . PUBOVAGINAL SLING  09/06/2011   Procedure: Gaynelle Arabian;  Surgeon: Ailene Rud, MD;  Location: [facility];  Service: Urology;  Laterality: N/A;  . UVULOPALATOPHARYNGOPLASTY, TONSILLECTOMY AND SEPTOPLASTY  1994    There were no vitals filed for this visit.  Subjective Assessment - 04/30/17 0935    Subjective  goes back to work in 5 days    Pertinent History  gastric bypass    Diagnostic tests  Xray - normal healing    Patient Stated Goals  improve pain and function    Currently in Pain?  No/denies    Pain Score  0-No pain                      [facility] Adult PT Treatment/Exercise - 04/30/17 0937       Shoulder Exercises: Sidelying   External Rotation  Strengthening;Right;15 reps;Weights    External Rotation Weight (lbs)  3    External Rotation Limitations  small motion past neutral - "challenging"    Flexion  Strengthening;Right;15 reps;Weights    Flexion Weight (lbs)  3    Flexion Limitations  "popping"    ABduction  Strengthening;Right;15 reps;Weights;Limitations    ABduction Weight (lbs)  3    ABduction Limitations  some discomfort      Shoulder Exercises: ROM/Strengthening   UBE (Upper Arm Bike)  L4 x 6 min (3/3)      Manual Therapy   Manual Therapy  Soft tissue mobilization;Passive ROM    Manual therapy comments  patient supine    Soft tissue mobilization  STM to R anterior and lateral proximal shoulder - trigger points noted    Passive ROM  PROM with stretch with overpressure at end range; flexion, abduction IR, ER       Trigger Point Dry Needling - 04/30/17 1004    Consent Given?  Yes    Muscles Treated Upper Body  -- R anterior and lateral deltoid             PT Short Term Goals - 01/13/17 1408      PT SHORT TERM GOAL #1   Title  patient to be independent with initial HEP     Status  Achieved      PT SHORT TERM GOAL #2   Title  patient to improve R shoulder PROM equal to that of L shoulder without pain limiting     Status  On-going        PT Long Term Goals - 03/27/17 7035      PT LONG TERM GOAL #1   Title  patient to be independent with advanced HEP    Status  Achieved      PT LONG TERM GOAL #2   Title  patient to improve R shoulder AROM equal to that of L shoulder without pain limiting    Status  On-going      PT LONG TERM GOAL #3   Title  patient to improve gross R UE strength to >/= 4/5    Status  Achieved      PT LONG TERM GOAL #4   Title  patient to report ability to perform ADLs and household tasks without pain limiting function    Status  On-going      PT LONG TERM GOAL #5   Title  patient to report ability to return to work at  full capacity.     Status  On-going  Plan - 04/30/17 0936    Clinical Impression Statement  Patient doing well today - some reported tenderness and trigger pointed noted at R anterior/lateral deltoid, with DN continued as patient has shown benefit from this in the past with good tolerance. Able to perform all resistance training today against gravity with no issue today. Will plan reassess goals at next visit and plan for 30 day hold as patient will be returning to work.    PT Treatment/Interventions  ADLs/Self Care Home Management;Cryotherapy;Electrical Stimulation;Iontophoresis 4mg /ml Dexamethasone;Moist Heat;Ultrasound;Therapeutic exercise;Therapeutic activities;Patient/family education;Manual techniques;Passive range of motion;Vasopneumatic Device;Taping;Dry needling    Consulted and Agree with Plan of Care  Patient       Patient will benefit from skilled therapeutic intervention in order to improve the following deficits and impairments:  Decreased activity tolerance, Decreased range of motion, Decreased strength, Impaired UE functional use, Pain  Visit Diagnosis: Acute pain of right shoulder  Stiffness of right shoulder, not elsewhere classified  Abnormal posture  Muscle weakness (generalized)     Problem List Patient Active Problem List   Diagnosis Date Noted  . Cervical disc disorder with radiculopathy of cervical region 04/25/2016  . Nonallopathic lesion of cervical region 04/17/2016  . Nonallopathic lesion of thoracic region 04/17/2016  . Nonallopathic lesion of rib cage 04/17/2016  . Nonallopathic lesion of lumbosacral region 04/17/2016  . Degenerative cervical disc 03/29/2016  . Degenerative arthritis of right knee 03/29/2016  . Acute blood loss anemia   . UGIB (upper gastrointestinal bleed) 03/10/2016  . Upper GI bleed 03/10/2016  . 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 Abdominal pain, epigastric   . Adjustment disorder with depressed mood 07/14/2014   . Hx of colonic polyps   . 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 Chronic constipation 03/05/2013  . Coronary atherosclerosis of native coronary artery 08/27/2011  . History of uterine cancer 08/15/2011  . Hypercholesterolemia 08/15/2011  . History of colonic polyps 12/29/2009     Lanney Gins, PT, DPT 04/30/17 10:16 AM   [facility] [address]  [place], [place], [address] Phone: [phone]   Fax:  [phone]  Name: LENDY DITTRICH MRN: [id number] Date of Birth: December 05, 1960

## 2017-05-02 ENCOUNTER — Encounter: Payer: Self-pay | Admitting: Physical Therapy

## 2017-05-02 ENCOUNTER — Ambulatory Visit: Payer: 59 | Admitting: Physical Therapy

## 2017-05-02 DIAGNOSIS — R293 Abnormal posture: Secondary | ICD-10-CM

## 2017-05-02 DIAGNOSIS — M6281 Muscle weakness (generalized): Secondary | ICD-10-CM

## 2017-05-02 DIAGNOSIS — M25511 Pain in right shoulder: Secondary | ICD-10-CM | POA: Diagnosis not present

## 2017-05-02 DIAGNOSIS — M25611 Stiffness of right shoulder, not elsewhere classified: Secondary | ICD-10-CM

## 2017-05-02 NOTE — Therapy (Addendum)
[facility] [address]  [place], [place], [address] Phone: [phone]   Fax:  [phone]  Physical Therapy Treatment  Patient Details  Name: Sandra [name] MRN: [id number] Date of Birth: [birth year] Referring Provider: Dr. Mardelle Matte   Encounter Date: 05/02/2017  PT End of Session - 05/02/17 1015    Visit Number  31    Number of Visits  32    Date for PT Re-Evaluation  04/24/17    Authorization Type  [facility]    PT Start Time  0933    PT Stop Time  1012    PT Time Calculation (min)  39 min    Activity Tolerance  Patient tolerated treatment well    Behavior During Therapy  [facility] for tasks assessed/performed       Past Medical History:  Diagnosis Date  . Anxiety   . Arthritis   . CAD (coronary artery disease) NON-OBSTRUCTIVE PER DR HOCHREIN NOTE 08-26-2011  . Cancer [facility]) 2006   uterine  . Complication of anesthesia PT STATES HAS LETTER FROM [place] [facility]. STATING THAT SHE HAS A VERY SMALL AIRWAY (LAST SURG. HYSTERECTOMY AT [facility] IN 2006   PT STATES LYNN BEASON CRNA WHOM WORKS HERE TOLD SHE WOULD BE OKAY TO BE DONE AT [facility]  . Diabetes mellitus    no meds taken now, checks cbg once or twice a week   . 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 GERD (gastroesophageal reflux disease)   . Heart murmur    with last visit, MD states "outgrown" murmur  . Hemorrhoids, internal, with bleeding and Grade 2 prolapse 03/05/2013   All positions seen an anoscopy RP columns banded 03/05/2013 LL and RA banded 03/18/2013     . History of endometrial cancer 2006 ---- S/P ABD. HYSTERECTOMY   STAGE I ENDOMETROID CARCINOMA  . Hx of adenomatous colonic polyps 2011   BENIGN  . Hyperlipidemia    no meds now  . Hypertension   . Morbid obesity, weight - 203, BMI - 38.4 05/12/2013  . OSA on CPAP    cpap setting of 16  . Sleep apnea    no CPAP since surgery    Past Surgical History:  Procedure Laterality Date  . ABDOMINAL  HYSTERECTOMY  12-20-2004   TAH  . BILATERAL BREAST REDUCTION  11-16-1999  . BREATH TEK H PYLORI N/A 05/27/2013   Procedure: BREATH TEK H PYLORI;  Surgeon: Shann Medal, MD;  [facility];  Service: General;  Laterality: N/A;  . [place]  . COLONOSCOPY    . COLONOSCOPY N/A 04/25/2014   Procedure: COLONOSCOPY;  Surgeon: Gatha Mayer, MD;  [facility];  Service: Endoscopy;  Laterality: N/A;  . CYSTOSCOPY  09/06/2011   Procedure: CYSTOSCOPY;  Surgeon: Ailene Rud, MD;  Location: [facility];  Service: Urology;  Laterality: N/A;  LYNX SLING   . ESOPHAGOGASTRODUODENOSCOPY (EGD) WITH PROPOFOL N/A 09/27/2014   Procedure: ESOPHAGOGASTRODUODENOSCOPY (EGD) WITH PROPOFOL;  Surgeon: Gatha Mayer, MD;  [facility];  Service: Endoscopy;  Laterality: N/A;  . ESOPHAGOGASTRODUODENOSCOPY (EGD) WITH PROPOFOL N/A 03/11/2016   Procedure: ESOPHAGOGASTRODUODENOSCOPY (EGD) WITH PROPOFOL;  Surgeon: Jerene Bears, MD;  [facility];  Service: Endoscopy;  Laterality: N/A;  . GASTRIC ROUX-EN-Y N/A 10/04/2013   Procedure: LAPAROSCOPIC ROUX-EN-Y GASTRIC BYPASS WITH UPPER ENDOSCOPY;  Surgeon: Shann Medal, MD;  [facility];  Service: General;  Laterality: N/A;  .  HEMORRHOID BANDING  02/2013  . PUBOVAGINAL SLING  09/06/2011   Procedure: Gaynelle Arabian;  Surgeon: Ailene Rud, MD;  Location: [facility];  Service: Urology;  Laterality: N/A;  . UVULOPALATOPHARYNGOPLASTY, TONSILLECTOMY AND SEPTOPLASTY  1994    There were no vitals filed for this visit.  Subjective Assessment - 05/02/17 1016    Subjective  feeling well - no new complaints    Pertinent History  gastric bypass    Diagnostic tests  Xray - normal healing    Patient Stated Goals  improve pain and function    Currently in Pain?  No/denies    Pain Score  0-No pain         [facility] PT Assessment - 05/02/17 0001      AROM   Right Shoulder Flexion   152 Degrees    Right Shoulder ABduction  147 Degrees    Right Shoulder Internal Rotation  -- FIR to~L5    Right Shoulder External Rotation  -- FER to ~T1      Strength   Right Shoulder Flexion  4+/5    Right Shoulder ABduction  4+/5    Right Shoulder Internal Rotation  4+/5    Right Shoulder External Rotation  4+/5                  [facility] Adult PT Treatment/Exercise - 05/02/17 0001      Shoulder Exercises: Standing   External Rotation  Strengthening;Right;15 reps    Theraband Level (Shoulder External Rotation)  Level 2 (Red)    External Rotation Limitations  90 deg abduction    Internal Rotation  Right;15 reps    Theraband Level (Shoulder Internal Rotation)  Level 2 (Red)    Internal Rotation Limitations  90 deg abduction    Extension  Strengthening;Both;15 reps;Theraband    Theraband Level (Shoulder Extension)  Level 3 (Green)    Row  Strengthening;Both;15 reps;Theraband    Theraband Level (Shoulder Row)  Level 3 (Green)      Shoulder Exercises: ROM/Strengthening   UBE (Upper Arm Bike)  L4 x 6 min (3/3)    Wall Pushups  15 reps    Wall Pushups Limitations  orange pball against wall    Other ROM/Strengthening Exercises  BATCA pull down - 20# x 15               PT Short Term Goals - 05/02/17 1018      PT SHORT TERM GOAL #1   Title  patient to be independent with initial HEP     Status  Achieved      PT SHORT TERM GOAL #2   Title  patient to improve R shoulder PROM equal to that of L shoulder without pain limiting     Status  Achieved        PT Long Term Goals - 05/02/17 1018      PT LONG TERM GOAL #1   Title  patient to be independent with advanced HEP    Status  Achieved      PT LONG TERM GOAL #2   Title  patient to improve R shoulder AROM equal to that of L shoulder without pain limiting    Status  Achieved      PT LONG TERM GOAL #3   Title  patient to improve gross R UE strength to >/= 4/5    Status  Achieved      PT LONG TERM GOAL #4    Title  patient to report ability to perform ADLs and household tasks without pain limiting function    Status  Achieved      PT LONG TERM GOAL #5   Title  patient to report ability to return to work at full capacity.     Status  Partially Met will reutrn to work on light duty and progress to full capacity            Plan - 05/02/17 [place] has progressed well with PT thus far - plans to return to work on Monday with light duty adn progress herself to full work capacity as she feels able. All goals met or partially met at this time with great improvements in strength and AROM at R UE. Patient with good understanding of need to continue to progress AROM and strength independently to maximize functional mobility. PT to place patient on 30 day hold at this time in the event patient needs to return to PT.     PT Treatment/Interventions  ADLs/Self Care Home Management;Cryotherapy;Electrical Stimulation;Iontophoresis 41m/ml Dexamethasone;Moist Heat;Ultrasound;Therapeutic exercise;Therapeutic activities;Patient/family education;Manual techniques;Passive range of motion;Vasopneumatic Device;Taping;Dry needling    Consulted and Agree with Plan of Care  Patient       Patient will benefit from skilled therapeutic intervention in order to improve the following deficits and impairments:  Decreased activity tolerance, Decreased range of motion, Decreased strength, Impaired UE functional use, Pain  Visit Diagnosis: Acute pain of right shoulder  Stiffness of right shoulder, not elsewhere classified  Abnormal posture  Muscle weakness (generalized)     Problem List Patient Active Problem List   Diagnosis Date Noted  . Cervical disc disorder with radiculopathy of cervical region 04/25/2016  . Nonallopathic lesion of cervical region 04/17/2016  . Nonallopathic lesion of thoracic region 04/17/2016  . Nonallopathic lesion of rib cage 04/17/2016  .  Nonallopathic lesion of lumbosacral region 04/17/2016  . Degenerative cervical disc 03/29/2016  . Degenerative arthritis of right knee 03/29/2016  . Acute blood loss anemia   . UGIB (upper gastrointestinal bleed) 03/10/2016  . Upper GI bleed 03/10/2016  . 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 Abdominal pain, epigastric   . Adjustment disorder with depressed mood 07/14/2014  . Hx of colonic polyps   . 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 Chronic constipation 03/05/2013  . Coronary atherosclerosis of native coronary artery 08/27/2011  . History of uterine cancer 08/15/2011  . Hypercholesterolemia 08/15/2011  . History of colonic polyps 12/29/2009     SLanney Gins PT, DPT 05/02/17 10:24 AM   [facility] [place] [address]Phone: [phone]  Fax:  [phone] Name: SRAKEISHA NYCEMRN: [id number] of Birth: [birth year]  PHYSICAL THERAPY DISCHARGE SUMMARY  Visits from Start of Care: 31  Current functional level related to goals / functional outcomes:   Refer to above clinical impression for status as of last visit on 05/02/17. Pt was placed on hold for 30 days and has not needed to return to PT, therefore will proceed with discharge from PT for this episode.   Remaining deficits:   As above.   Education / Equipment:   HEP  Plan: Patient agrees to discharge.  Patient goals were met. Patient is being discharged due to meeting the stated rehab goals.  ?????    JPercival Spanish PT, MPT 06/09/17, 3:12 PM  [facility]  [facility] [address]  [place], [place], [address] Phone: [phone]   Fax:  [phone]

## 2017-05-15 ENCOUNTER — Telehealth: Payer: Self-pay

## 2017-05-15 MED ORDER — LINACLOTIDE 145 MCG PO CAPS: 145.0000 ug | ORAL_CAPSULE | Freq: Every day | ORAL | 0 refills | Status: DC

## 2017-05-15 NOTE — Telephone Encounter (Signed)
Linah would like to know if you can rx Linzess again for her.  She has had constipation issues x 6 months or more.  On average has a bowel movement 1-2 times a week. She is currently taking 1-2 doses of Miralax daily. Please advise Sir.

## 2017-05-15 NOTE — Telephone Encounter (Signed)
Linzess 145 ug daily # 30 vs 90 - ask her 1 year total  Samples ok too

## 2017-05-15 NOTE — Telephone Encounter (Signed)
Patient supplied with samples of the Linzess 145 mcg. And she will let us know if this helps before we send it in.

## 2017-06-03 ENCOUNTER — Telehealth: Payer: Self-pay

## 2017-06-03 MED ORDER — LINACLOTIDE 145 MCG PO CAPS: 145.0000 ug | ORAL_CAPSULE | Freq: Every day | ORAL | 5 refills | Status: DC

## 2017-06-03 MED ORDER — LINACLOTIDE 145 MCG PO CAPS: 145.0000 ug | ORAL_CAPSULE | Freq: Every day | ORAL | 11 refills | Status: DC

## 2017-06-03 MED FILL — LINZESS 145 MCG CAPSULE: 145 | 30 days supply | Qty: 30 | Fill #0

## 2017-06-03 NOTE — Telephone Encounter (Signed)
Sandra [name] called to report that the Linzess 145 mcg samples worked and she requested that rx be sent in. I sent it in to [facility] as requested. Okayed per Dr Carlean Purl on 05/15/17.

## 2017-06-04 DIAGNOSIS — M25511 Pain in right shoulder: Secondary | ICD-10-CM | POA: Diagnosis not present

## 2017-06-07 DIAGNOSIS — M25511 Pain in right shoulder: Secondary | ICD-10-CM | POA: Diagnosis not present

## 2017-06-13 DIAGNOSIS — M67911 Unspecified disorder of synovium and tendon, right shoulder: Secondary | ICD-10-CM | POA: Diagnosis not present

## 2017-07-18 DIAGNOSIS — M67911 Unspecified disorder of synovium and tendon, right shoulder: Secondary | ICD-10-CM | POA: Diagnosis not present

## 2017-07-18 DIAGNOSIS — M24811 Other specific joint derangements of right shoulder, not elsewhere classified: Secondary | ICD-10-CM | POA: Diagnosis not present

## 2017-07-29 ENCOUNTER — Telehealth: Payer: Self-pay

## 2017-07-29 MED ORDER — ONDANSETRON 4 MG PO TBDP: 4.0000 mg | ORAL_TABLET | Freq: Three times a day (TID) | ORAL | 3 refills | Status: DC | PRN

## 2017-07-29 NOTE — Telephone Encounter (Signed)
Sandra [name] called and requested a refill on generic zofran 4 mg ODT type because of her gastric bypass this will work better for her.  Okayed per Dr Carlean Purl along with refills.

## 2017-07-30 ENCOUNTER — Other Ambulatory Visit: Payer: Self-pay | Admitting: Internal Medicine

## 2017-07-30 DIAGNOSIS — Z9884 Bariatric surgery status: Secondary | ICD-10-CM

## 2017-07-30 NOTE — Progress Notes (Signed)
Lab orders for f/u of gastric bypass and hx GI bleed.

## 2017-08-01 ENCOUNTER — Telehealth: Payer: Self-pay | Admitting: Internal Medicine

## 2017-08-01 ENCOUNTER — Other Ambulatory Visit (INDEPENDENT_AMBULATORY_CARE_PROVIDER_SITE_OTHER): Payer: 59

## 2017-08-01 DIAGNOSIS — Z9884 Bariatric surgery status: Secondary | ICD-10-CM | POA: Diagnosis not present

## 2017-08-01 LAB — CBC
HCT: 42 % (ref 36.0–46.0)
Hemoglobin: 14.1 g/dL (ref 12.0–15.0)
MCHC: 33.6 g/dL (ref 30.0–36.0)
MCV: 92 fl (ref 78.0–100.0)
Platelets: 262 10*3/uL (ref 150.0–400.0)
RBC: 4.57 Mil/uL (ref 3.87–5.11)
RDW: 12.9 % (ref 11.5–15.5)
WBC: 7.2 10*3/uL (ref 4.0–10.5)

## 2017-08-01 LAB — COMPREHENSIVE METABOLIC PANEL
ALT: 30 U/L (ref 0–35)
AST: 29 U/L (ref 0–37)
Albumin: 4.4 g/dL (ref 3.5–5.2)
Alkaline Phosphatase: 56 U/L (ref 39–117)
BUN: 22 mg/dL (ref 6–23)
CO2: 27 mEq/L (ref 19–32)
Calcium: 9.3 mg/dL (ref 8.4–10.5)
Chloride: 106 mEq/L (ref 96–112)
Creatinine, Ser: 0.54 mg/dL (ref 0.40–1.20)
GFR: 123.37 mL/min (ref >60.00)
Glucose, Bld: 122 mg/dL — ABNORMAL HIGH (ref 70–99)
Potassium: 3.9 mEq/L (ref 3.5–5.1)
Sodium: 143 mEq/L (ref 135–145)
Total Bilirubin: 0.3 mg/dL (ref 0.2–1.2)
Total Protein: 6.8 g/dL (ref 6.0–8.3)

## 2017-08-01 LAB — VITAMIN B12: Vitamin B-12: >1500 pg/mL — ABNORMAL HIGH (ref 211–911)

## 2017-08-01 LAB — FERRITIN: Ferritin: 21.5 ng/mL (ref 10.0–291.0)

## 2017-08-01 LAB — FOLATE: Folate: >23.6 ng/mL (ref >5.9)

## 2017-08-01 LAB — VITAMIN D 25 HYDROXY (VIT D DEFICIENCY, FRACTURES): VITD: 31.79 ng/mL (ref 30.00–100.00)

## 2017-08-01 NOTE — Progress Notes (Signed)
My Chart message re: labs ok  Area of concern glucose 122

## 2017-08-01 NOTE — Telephone Encounter (Signed)
Dr. Quay Burow has agreed to take patient on at Dr. Celesta Aver request.  I have left patient vm to call back to schedule.

## 2017-08-06 ENCOUNTER — Other Ambulatory Visit: Payer: Self-pay | Admitting: Orthopedic Surgery

## 2017-08-06 MED FILL — ONDANSETRON ODT 4 MG TABLET: 4 | 5 days supply | Qty: 30 | Fill #0

## 2017-08-14 MED FILL — LINZESS 145 MCG CAPSULE: 145 | 30 days supply | Qty: 30 | Fill #1

## 2017-08-19 NOTE — Progress Notes (Signed)
[name] [address], [place] [address] Phone: [phone] Subjective:     CC: Neck pain follow-up  GUR:[id number]   Sandra [name] is a 57 y.o. female coming in with complaint of neck pain.  Was found to have mild osteoarthritic changes.  Patient did though have central disc protrusions from C3 through C7 were significant seen on MRI July 02, 2016.  Attempted epidural July 29, 2016.  Patient was then lost to follow-up.  Did have a closed displaced fracture of the acromioclavicular joint and has been undergoing physical therapy for quite some time. Broke her collar bone last may. Didn't heal correctly. Was told she has 3 rotator cuff tears and wants a second opinion for surgery.  Patient states continues some discomfort and pain.  Patient states that from time to time in certain position she has weakness.  Patient is just wanting to make sure she knows what she is doing is correct.    Past Medical History:  Diagnosis Date  . Anxiety   . Arthritis   . CAD (coronary artery disease) NON-OBSTRUCTIVE PER DR HOCHREIN NOTE 08-26-2011  . Cancer [facility]) 2006   uterine  . Complication of anesthesia PT STATES HAS LETTER FROM [place] [facility]. STATING THAT SHE HAS A VERY SMALL AIRWAY (LAST SURG. HYSTERECTOMY AT [facility] IN 2006   PT STATES LYNN BEASON CRNA WHOM WORKS HERE TOLD SHE WOULD BE OKAY TO BE DONE AT [facility]  . Diabetes mellitus    no meds taken now, checks cbg once or twice a week   . 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 GERD (gastroesophageal reflux disease)   . Heart murmur    with last visit, MD states "outgrown" murmur  . Hemorrhoids, internal, with bleeding and Grade 2 prolapse 03/05/2013   All positions seen an anoscopy RP columns banded 03/05/2013 LL and RA banded 03/18/2013     . History of endometrial cancer 2006 ---- S/P ABD. HYSTERECTOMY   STAGE I ENDOMETROID CARCINOMA  . Hx of adenomatous colonic polyps 2011   BENIGN  .  Hyperlipidemia    no meds now  . Hypertension   . Morbid obesity, weight - 203, BMI - 38.4 05/12/2013  . OSA on CPAP    cpap setting of 16  . Sleep apnea    no CPAP since surgery   Past Surgical History:  Procedure Laterality Date  . ABDOMINAL HYSTERECTOMY  12-20-2004   TAH  . BILATERAL BREAST REDUCTION  11-16-1999  . BREATH TEK H PYLORI N/A 05/27/2013   Procedure: BREATH TEK H PYLORI;  Surgeon: Shann Medal, MD;  [facility];  Service: General;  Laterality: N/A;  . [place]  . COLONOSCOPY    . COLONOSCOPY N/A 04/25/2014   Procedure: COLONOSCOPY;  Surgeon: Gatha Mayer, MD;  Location: [facility];  Service: Endoscopy;  Laterality: N/A;  . CYSTOSCOPY  09/06/2011   Procedure: CYSTOSCOPY;  Surgeon: Ailene Rud, MD;  Location[facility];  Service: Urology;  Laterality: N/A;  LYNX SLING   . ESOPHAGOGASTRODUODENOSCOPY (EGD) WITH PROPOFOL N/A 09/27/2014   Procedure: ESOPHAGOGASTRODUODENOSCOPY (EGD) WITH PROPOFOL;  Surgeon: Gatha Mayer, MD;  Location: [facility];  Service: Endoscopy;  Laterality: N/A;  . ESOPHAGOGASTRODUODENOSCOPY (EGD) WITH PROPOFOL N/A 03/11/2016   Procedure: ESOPHAGOGASTRODUODENOSCOPY (EGD) WITH PROPOFOL;  Surgeon: Jerene Bears, MD;  Location: [facility];  Service: Endoscopy;  Laterality: N/A;  . GASTRIC ROUX-EN-Y N/A 10/04/2013  Procedure: LAPAROSCOPIC ROUX-EN-Y GASTRIC BYPASS WITH UPPER ENDOSCOPY;  Surgeon: Shann Medal, MD;  Location: [facility];  Service: General;  Laterality: N/A;  . HEMORRHOID BANDING  02/2013  . PUBOVAGINAL SLING  09/06/2011   Procedure: Gaynelle Arabian;  Surgeon: Ailene Rud, MD;  Location: [facility];  Service: Urology;  Laterality: N/A;  . UVULOPALATOPHARYNGOPLASTY, TONSILLECTOMY AND SEPTOPLASTY  1994   Social History   Socioeconomic History  . Marital status: Single    Spouse name: Not on file  . Number of children: Not on file  . Years of education: Not  on file  . Highest education level: Not on file  Occupational History  . Not on file  Social Needs  . Financial resource strain: Not on file  . Food insecurity:    Worry: Not on file    Inability: Not on file  . Transportation needs:    Medical: Not on file    Non-medical: Not on file  Tobacco Use  . Smoking status: Never Smoker  . Smokeless tobacco: Never Used  Substance and Sexual Activity  . Alcohol use: No    Alcohol/week: 0.0 oz  . Drug use: No  . Sexual activity: Not Currently    Birth control/protection: Surgical    Comment: HYST.. 1st intercourse- 13, partners- 6  Lifestyle  . Physical activity:    Days per week: Not on file    Minutes per session: Not on file  . Stress: Not on file  Relationships  . Social connections:    Talks on phone: Not on file    Gets together: Not on file    Attends religious service: Not on file    Active member of club or organization: Not on file    Attends meetings of clubs or organizations: Not on file    Relationship status: Not on file  Other Topics Concern  . Not on file  Social History Narrative   Married, second time, has children and cares for granddaughter   Endoscopy Tech at [employer]   Allergies  Allergen Reactions  . Nsaids     r/t gastric surgery   Family History  Adopted: Yes     Past medical history, social, surgical and family history all reviewed in electronic medical record.  No pertanent information unless stated regarding to the chief complaint.   Review of Systems:Review of systems updated and as accurate as of 08/19/17  No headache, visual changes, nausea, vomiting, diarrhea, constipation, dizziness, abdominal pain, skin rash, fevers, chills, night sweats, weight loss, swollen lymph nodes, body aches, joint swelling, muscle aches, chest pain, shortness of breath, mood changes.   Objective  There were no vitals taken for this visit. Systems examined below as of 08/19/17   General: No  apparent distress alert and oriented x3 mood and affect normal, dressed appropriately.  HEENT: Pupils equal, extraocular movements intact  Respiratory: Patient's speak in full sentences and does not appear short of breath  Cardiovascular: No lower extremity edema, non tender, no erythema  Skin: Warm dry intact with no signs of infection or rash on extremities or on axial skeleton.  Abdomen: Soft nontender  Neuro: Cranial nerves II through XII are intact, neurovascularly intact in all extremities with 2+ DTRs and 2+ pulses.  Lymph: No lymphadenopathy of posterior or anterior cervical chain or axillae bilaterally.  Gait normal with good balance and coordination.  MSK:  Non tender with full range of motion and good stability and symmetric strength and  tone of elbows, wrist, hip, knee and ankles bilaterally.  Shoulder: Right Inspection reveals mild asymmetry of the acromioclavicular joint noted. Palpation is normal with no tenderness over AC joint or bicipital groove. ROM is restricted lacking the last 10 degrees of internal rotation left 5 degrees of external rotation. Rotator cuff strength 4 out of 5 compared to contralateral side Positive impingement Speeds and Yergason's tests normal. No labral pathology noted with negative Obrien's, negative clunk and good stability. Normal scapular function observed. Positive painful arc negative drop arm sign Contralateral shoulder unremarkable  MSK US performed of:  Right shoulder This study was ordered, performed, and interpreted by Charlann Boxer D.O.  Shoulder:   Supraspinatus: Large near full-thickness tear noted.  Does have retraction of the greatest length of 1.12 cm.   Subscapularis:  Appears normal on long and transverse views. AC joint:  Capsule undistended, no geyser sign.  Distal clavicle fracture well-healed. Glenohumeral Joint:  Appears normal without effusion. Glenoid Labrum:  Intact without visualized tears. Biceps Tendon:  Appears  normal on long and tran sverse views, no fraying of tendon, tendon located in intertubercular groove, no subluxation with shoulder internal or external rotation. No increased power doppler signal. Contralateral shoulder unremarkable Impression: Near full-thickness tear of the supraspinatus with retraction.     Impression and Recommendations:     This case required medical decision making of moderate complexity.      Note: This dictation was prepared with Dragon dictation along with smaller phrase technology. Any transcriptional errors that result from this process are unintentional.

## 2017-08-20 ENCOUNTER — Ambulatory Visit: Payer: Self-pay

## 2017-08-20 ENCOUNTER — Encounter: Payer: Self-pay | Admitting: Family Medicine

## 2017-08-20 ENCOUNTER — Ambulatory Visit: Payer: 59 | Admitting: Family Medicine

## 2017-08-20 VITALS — BP 138/84 | HR 101 | Ht 62.0 in | Wt 154.0 lb

## 2017-08-20 DIAGNOSIS — G8929 Other chronic pain: Secondary | ICD-10-CM

## 2017-08-20 DIAGNOSIS — S46011A Strain of muscle(s) and tendon(s) of the rotator cuff of right shoulder, initial encounter: Secondary | ICD-10-CM

## 2017-08-20 DIAGNOSIS — M25511 Pain in right shoulder: Secondary | ICD-10-CM | POA: Diagnosis not present

## 2017-08-20 DIAGNOSIS — M75101 Unspecified rotator cuff tear or rupture of right shoulder, not specified as traumatic: Secondary | ICD-10-CM | POA: Insufficient documentation

## 2017-08-20 MED ORDER — VITAMIN D (ERGOCALCIFEROL) 1.25 MG (50000 UNIT) PO CAPS: ORAL_CAPSULE | ORAL | 0 refills | Status: DC

## 2017-08-20 MED ORDER — NITROGLYCERIN 0.2 MG/HR TD PT24: MEDICATED_PATCH | TRANSDERMAL | 1 refills | Status: DC

## 2017-08-20 MED FILL — VIT D2 1.25 MG (50,000 UNIT: 1.25 MG | 84 days supply | Qty: 12 | Fill #0

## 2017-08-20 MED FILL — NITROGLYCERIN 0.2 MG/HR PTC: 0.2 | 90 days supply | Qty: 23 | Fill #0

## 2017-08-20 NOTE — Assessment & Plan Note (Signed)
Patient does have rotator cuff tearing.  Has an MRI in summer showing that there was 3 potential tenderness.  On the ultrasound today only one noted.  We discussed with patient in great length.  We discussed that with the retraction there is concern that this could decrease healing with the being greater than 1 cm.  We discussed that if he goes greater than 2 cm in retraction and it would not be able to be fixed.  We discussed at this point because she is already had injections and physical therapy and do not feel repeating these would be beneficial.  The only thing would be an experimental PRP injection and started nitroglycerin today.  We discussed icing regimen and home exercises.  Any worsening symptoms patient should get surgical intervention.

## 2017-08-20 NOTE — Patient Instructions (Signed)
Good to see you  Alvera Singh is your friend. Ice 20 minutes 2 times daily. Usually after activity and before bed. Exercises 3 times a week.  Keep hands within peripheral vision  Once weekly vitamin D for 12 weeks Nitroglycerin Protocol   Apply 1/4 nitroglycerin patch to affected area daily.  Change position of patch within the affected area every 24 hours.  You may experience a headache during the first 1-2 weeks of using the patch, these should subside.  If you experience headaches after beginning nitroglycerin patch treatment, you may take your preferred over the counter pain reliever.  Another side effect of the nitroglycerin patch is skin irritation or rash related to patch adhesive.  Please notify our office if you develop more severe headaches or rash, and stop the patch.  Tendon healing with nitroglycerin patch may require 12 to 24 weeks depending on the extent of injury.  Men should not use if taking Viagra, Cialis, or Levitra.   Do not use if you have migraines or rosacea.  Consider PRP and call us at [phone] if you decide to.  Or send me a message.

## 2017-08-25 ENCOUNTER — Encounter: Payer: Self-pay | Admitting: Family Medicine

## 2017-08-27 ENCOUNTER — Inpatient Hospital Stay (HOSPITAL_COMMUNITY): Admission: RE | Admit: 2017-08-27 | Payer: 59 | Source: Ambulatory Visit

## 2017-08-27 DIAGNOSIS — D485 Neoplasm of uncertain behavior of skin: Secondary | ICD-10-CM | POA: Diagnosis not present

## 2017-08-27 DIAGNOSIS — L738 Other specified follicular disorders: Secondary | ICD-10-CM | POA: Diagnosis not present

## 2017-08-27 DIAGNOSIS — D225 Melanocytic nevi of trunk: Secondary | ICD-10-CM | POA: Diagnosis not present

## 2017-08-27 DIAGNOSIS — L814 Other melanin hyperpigmentation: Secondary | ICD-10-CM | POA: Diagnosis not present

## 2017-08-27 DIAGNOSIS — D1801 Hemangioma of skin and subcutaneous tissue: Secondary | ICD-10-CM | POA: Diagnosis not present

## 2017-08-27 DIAGNOSIS — L720 Epidermal cyst: Secondary | ICD-10-CM | POA: Diagnosis not present

## 2017-09-04 NOTE — Progress Notes (Signed)
Subjective:    Patient ID: Sandra [name], female    DOB: Dec 19, 1960, 57 y.o.   MRN: [id number]  HPI She is here to establish with a new pcp.     Diabetes: She is not on any medication.  She had a gastric bypass surgery in 2015 and since then her sugars have been in the normal range.  She is active at work, but not currently exercising regularly.  She is fairly compliant with a low sugar/carbohydrate diet, but recently has not been doing as well.   Elevated blood pressure:  Her blood pressure is elevated here today.  She does not have hypertension.  She states it does go up when she is nervous and she does feel that it is typically well controlled.  Depression, some anxiety:  She has struggled with depression since her father died 3 years ago.  She has had increased anxiety with work and health problems.  She was on paxil and Effexor in the past.  She thinks she needs something down to help with both anxiety and depression.  She feels like she is on edge all the time.  Medications and allergies reviewed with patient and updated if appropriate.  Patient Active Problem List   Diagnosis Date Noted  . Right rotator cuff tear 08/20/2017  . Cervical disc disorder with radiculopathy of cervical region 04/25/2016  . Nonallopathic lesion of cervical region 04/17/2016  . Nonallopathic lesion of thoracic region 04/17/2016  . Nonallopathic lesion of rib cage 04/17/2016  . Nonallopathic lesion of lumbosacral region 04/17/2016  . Degenerative cervical disc 03/29/2016  . Degenerative arthritis of right knee 03/29/2016  . 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 Abdominal pain, epigastric   . Adjustment disorder with depressed mood 07/14/2014  . Hx of colonic polyps   . 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 Chronic constipation 03/05/2013  . Coronary atherosclerosis of native coronary artery 08/27/2011  . History of uterine cancer 08/15/2011  . Hypercholesterolemia 08/15/2011   . History of colonic polyps 12/29/2009    Current Outpatient Medications on File Prior to Visit  Medication Sig Dispense Refill  . Calcium Carb-Cholecalciferol (CALCIUM PLUS VITAMIN D3 PO) Take 1 tablet by mouth 3 (three) times daily.     . Cyanocobalamin (B-12) 1000 MCG SUBL Place 1,000 mcg under the tongue daily.    Marland Kitchen linaclotide (LINZESS) 145 MCG CAPS capsule Take 1 capsule (145 mcg total) by mouth daily before breakfast. (Patient taking differently: Take 145 mcg by mouth daily as needed (constipation). ) 30 capsule 11  . Multiple Vitamins-Minerals (MULTIVITAMIN PO) Take 1 tablet by mouth 2 (two) times daily.     . nitroGLYCERIN (NITRODUR - DOSED IN MG/24 HR) 0.2 mg/hr patch 1/4 patch daily 30 patch 1  . ondansetron (ZOFRAN ODT) 4 MG disintegrating tablet Take 1 tablet (4 mg total) by mouth every 8 (eight) hours as needed for nausea or vomiting (May take 2 if one ineffective). 30 tablet 3  . Vitamin D, Ergocalciferol, (DRISDOL) 50000 units CAPS capsule TAKE 1 CAPSULE BY MOUTH EVERY 7 DAYS 12 capsule 0   No current facility-administered medications on file prior to visit.     Past Medical History:  Diagnosis Date  . Anxiety   . Arthritis   . CAD (coronary artery disease) NON-OBSTRUCTIVE PER DR HOCHREIN NOTE 08-26-2011  . Cancer [facility] 2006   uterine  . Complication of anesthesia PT STATES HAS LETTER FROM [place] [facility]. STATING THAT SHE HAS A VERY SMALL AIRWAY (  LAST SURG. HYSTERECTOMY AT [facility] IN 2006   PT STATES LYNN BEASON CRNA WHOM WORKS HERE TOLD SHE WOULD BE OKAY TO BE DONE AT [facility]  . Diabetes mellitus    no meds taken now, checks cbg once or twice a week   . 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 GERD (gastroesophageal reflux disease)   . Heart murmur    with last visit, MD states "outgrown" murmur  . Hemorrhoids, internal, with bleeding and Grade 2 prolapse 03/05/2013   All positions seen an anoscopy RP columns banded 03/05/2013 LL and RA banded 03/18/2013     .  History of endometrial cancer 2006 ---- S/P ABD. HYSTERECTOMY   STAGE I ENDOMETROID CARCINOMA  . Hx of adenomatous colonic polyps 2011   BENIGN  . Hyperlipidemia    no meds now  . Hypertension   . Morbid obesity, weight - 203, BMI - 38.4 05/12/2013  . OSA on CPAP    cpap setting of 16  . Sleep apnea    no CPAP since surgery    Past Surgical History:  Procedure Laterality Date  . ABDOMINAL HYSTERECTOMY  12-20-2004   TAH  . BILATERAL BREAST REDUCTION  11-16-1999  . BREATH TEK H PYLORI N/A 05/27/2013   Procedure: BREATH TEK H PYLORI;  Surgeon: Shann Medal, MD;  [facility];  Service: General;  Laterality: N/A;  . [place]  . COLONOSCOPY    . COLONOSCOPY N/A 04/25/2014   Procedure: COLONOSCOPY;  Surgeon: Gatha Mayer, MD;  [facility];  Service: Endoscopy;  Laterality: N/A;  . CYSTOSCOPY  09/06/2011   Procedure: CYSTOSCOPY;  Surgeon: Ailene Rud, MD;  Location[facility];  Service: Urology;  Laterality: N/A;  LYNX SLING   . ESOPHAGOGASTRODUODENOSCOPY (EGD) WITH PROPOFOL N/A 09/27/2014   Procedure: ESOPHAGOGASTRODUODENOSCOPY (EGD) WITH PROPOFOL;  Surgeon: Gatha Mayer, MD;  [facility];  Service: Endoscopy;  Laterality: N/A;  . ESOPHAGOGASTRODUODENOSCOPY (EGD) WITH PROPOFOL N/A 03/11/2016   Procedure: ESOPHAGOGASTRODUODENOSCOPY (EGD) WITH PROPOFOL;  Surgeon: Jerene Bears, MD;  [facility];  Service: Endoscopy;  Laterality: N/A;  . GASTRIC ROUX-EN-Y N/A 10/04/2013   Procedure: LAPAROSCOPIC ROUX-EN-Y GASTRIC BYPASS WITH UPPER ENDOSCOPY;  Surgeon: Shann Medal, MD;  Location[facility];  Service: General;  Laterality: N/A;  . HEMORRHOID BANDING  02/2013  . PUBOVAGINAL SLING  09/06/2011   Procedure: Gaynelle Arabian;  Surgeon: Ailene Rud, MD;  Location: [facility];  Service: Urology;  Laterality: N/A;  . UVULOPALATOPHARYNGOPLASTY, TONSILLECTOMY AND SEPTOPLASTY  1994    Social  History   Socioeconomic History  . Marital status: Single    Spouse name: Not on file  . Number of children: Not on file  . Years of education: Not on file  . Highest education level: Not on file  Occupational History  . Not on file  Social Needs  . Financial resource strain: Not on file  . Food insecurity:    Worry: Not on file    Inability: Not on file  . Transportation needs:    Medical: Not on file    Non-medical: Not on file  Tobacco Use  . Smoking status: Never Smoker  . Smokeless tobacco: Never Used  Substance and Sexual Activity  . Alcohol use: No    Alcohol/week: 0.0 oz  . Drug use: No  . Sexual activity: Not Currently    Birth control/protection: Surgical    Comment: HYST.. 1st intercourse-  58, partners- 6  Lifestyle  . Physical activity:    Days per week: Not on file    Minutes per session: Not on file  . Stress: Not on file  Relationships  . Social connections:    Talks on phone: Not on file    Gets together: Not on file    Attends religious service: Not on file    Active member of club or organization: Not on file    Attends meetings of clubs or organizations: Not on file    Relationship status: Not on file  Other Topics Concern  . 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  Eyes: Negative for visual disturbance.  Respiratory: Negative for cough, shortness of breath and wheezing.   Cardiovascular: Negative for chest pain, palpitations and leg swelling.  Gastrointestinal: Positive for constipation (controlled - with linzess) and nausea (occasional). Negative for abdominal pain, blood in stool and diarrhea.  Neurological: Negative for light-headedness and headaches.    Psychiatric/Behavioral: Positive for dysphoric mood. The patient is nervous/anxious.        Objective:   Vitals:   09/05/17 1407  BP: (!) 144/90  Pulse: 87  Resp: 16  Temp: 98.7 F (37.1 C)  SpO2: 98%   Filed Weights   09/05/17 1407  Weight: 156 lb (70.8 kg)   Body mass index is 28.53 kg/m.  Wt Readings from Last 3 Encounters:  09/05/17 156 lb (70.8 kg)  08/20/17 154 lb (69.9 kg)  10/22/16 146 lb (66.2 kg)     Physical Exam Constitutional: She appears well-developed and well-nourished. No distress.  HENT:  Head: Normocephalic and atraumatic.  Right Ear: External ear normal. Normal ear canal and TM Left Ear: External ear normal.  Normal ear canal and TM Mouth/Throat: Oropharynx is clear and moist.  Eyes: Conjunctivae and EOM are normal.  Neck: Neck supple. No tracheal deviation present. No thyromegaly present.  No carotid bruit  Cardiovascular: Normal rate, regular rhythm and normal heart sounds.   No murmur heard.  No edema. Pulmonary/Chest: Effort normal and breath sounds normal. No respiratory distress. She has no wheezes. She has no rales.  Breast: deferred  Abdominal: Soft. She exhibits no distension. There is no tenderness.  Lymphadenopathy: She has no cervical adenopathy.  Skin: Skin is warm and dry. She is not diaphoretic.  Psychiatric: She has a normal mood and affect. Her behavior is normal.        Assessment & Plan:     See Problem List for Assessment and Plan of chronic medical problems.

## 2017-09-05 ENCOUNTER — Encounter: Payer: Self-pay | Admitting: Internal Medicine

## 2017-09-05 ENCOUNTER — Ambulatory Visit: Payer: 59 | Admitting: Internal Medicine

## 2017-09-05 VITALS — BP 144/90 | HR 87 | Temp 98.7°F | Resp 16 | Ht 62.0 in | Wt 156.0 lb

## 2017-09-05 DIAGNOSIS — K289 Gastrojejunal ulcer, unspecified as acute or chronic, without hemorrhage or perforation: Secondary | ICD-10-CM | POA: Diagnosis not present

## 2017-09-05 DIAGNOSIS — I2510 Atherosclerotic heart disease of native coronary artery without angina pectoris: Secondary | ICD-10-CM

## 2017-09-05 DIAGNOSIS — F4323 Adjustment disorder with mixed anxiety and depressed mood: Secondary | ICD-10-CM

## 2017-09-05 DIAGNOSIS — K5909 Other constipation: Secondary | ICD-10-CM | POA: Diagnosis not present

## 2017-09-05 DIAGNOSIS — R030 Elevated blood-pressure reading, without diagnosis of hypertension: Secondary | ICD-10-CM

## 2017-09-05 DIAGNOSIS — Z8542 Personal history of malignant neoplasm of other parts of uterus: Secondary | ICD-10-CM | POA: Diagnosis not present

## 2017-09-05 MED ORDER — ESCITALOPRAM OXALATE 10 MG PO TABS: 10.0000 mg | ORAL_TABLET | Freq: Every day | ORAL | 5 refills | Status: DC

## 2017-09-05 MED ORDER — BUPROPION HCL ER (XL) 150 MG PO TB24: 150.0000 mg | ORAL_TABLET | Freq: Every day | ORAL | 5 refills | Status: DC

## 2017-09-05 MED FILL — buPROPion HCL ER (XL) 150 M: 150 | 30 days supply | Qty: 30 | Fill #0

## 2017-09-05 MED FILL — ESCITALOPRAM 10 MG TABLET: 10 | 30 days supply | Qty: 30 | Fill #0

## 2017-09-05 NOTE — Patient Instructions (Addendum)
   Medications reviewed and updated.  Changes include starting wellbutrin and lexapro daily.    Your prescription(s) have been submitted to your pharmacy. Please take as directed and contact our office if you believe you are having problem(s) with the medication(s).   Please followup in 6 months

## 2017-09-05 NOTE — Assessment & Plan Note (Addendum)
Start wellbutrin 150 mg and lexparo 10 mg Consider seeing a therapist She will let me know if this combination is not effective so we can adjust it Follow-up in 2 months, sooner if needed

## 2017-09-06 DIAGNOSIS — R030 Elevated blood-pressure reading, without diagnosis of hypertension: Secondary | ICD-10-CM | POA: Insufficient documentation

## 2017-09-06 NOTE — Assessment & Plan Note (Signed)
Her blood pressure is high here today, but she states it is typically better controlled.  She states she is slightly nervous today She can monitor it we will make sure that it is at goal Discussed the importance of regular exercise, low-sodium diet

## 2017-09-06 NOTE — Assessment & Plan Note (Signed)
Controlled with Linzess every other day

## 2017-09-06 NOTE — Assessment & Plan Note (Signed)
2016-related to NSAIDs No current symptoms No GERD or stomach pain

## 2017-09-06 NOTE — Assessment & Plan Note (Signed)
Nonobstructive CAD by coronary CTA in 2013 Controlled risk factors Encouraged regular exercise Monitor blood pressure to ensure it is well controlled

## 2017-09-08 NOTE — Progress Notes (Signed)
[name] [address] [place], [place] [address] Phone: [phone] Subjective:    I'm seeing this patient by the request  of:    CC: Right shoulder pain follow-up  OAC:[id number]  Sandra [name] is a 57 y.o. female coming in with complaint of right shoulder pain. She continues to have chronic pain in that shoulder and is here today for a PRP injection. Has started using nitro patch but did not notice any difference.     Past Medical History:  Diagnosis Date  . Anxiety   . Arthritis   . CAD (coronary artery disease) NON-OBSTRUCTIVE PER DR HOCHREIN NOTE 08-26-2011  . Cancer [facility]) 2006   uterine  . Complication of anesthesia PT STATES HAS LETTER FROM [place] [facility]. STATING THAT SHE HAS A VERY SMALL AIRWAY (LAST SURG. HYSTERECTOMY AT [facility] IN 2006   PT STATES LYNN BEASON CRNA WHOM WORKS HERE TOLD SHE WOULD BE OKAY TO BE DONE AT [facility]  . Diabetes mellitus    no meds taken now, checks cbg once or twice a week   . 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 GERD (gastroesophageal reflux disease)   . Heart murmur    with last visit, MD states "outgrown" murmur  . Hemorrhoids, internal, with bleeding and Grade 2 prolapse 03/05/2013   All positions seen an anoscopy RP columns banded 03/05/2013 LL and RA banded 03/18/2013     . History of endometrial cancer 2006 ---- S/P ABD. HYSTERECTOMY   STAGE I ENDOMETROID CARCINOMA  . Hx of adenomatous colonic polyps 2011   BENIGN  . Hyperlipidemia    no meds now  . Hypertension   . Morbid obesity, weight - 203, BMI - 38.4 05/12/2013  . OSA on CPAP    cpap setting of 16  . Sleep apnea    no CPAP since surgery   Past Surgical History:  Procedure Laterality Date  . ABDOMINAL HYSTERECTOMY  12-20-2004   TAH  . BILATERAL BREAST REDUCTION  11-16-1999  . BREATH TEK H PYLORI N/A 05/27/2013   Procedure: BREATH TEK H PYLORI;  Surgeon: Shann Medal, MD;  [facility];  Service: General;  Laterality:  N/A;  . [place]  . COLONOSCOPY    . COLONOSCOPY N/A 04/25/2014   Procedure: COLONOSCOPY;  Surgeon: Gatha Mayer, MD;  [facility];  Service: Endoscopy;  Laterality: N/A;  . CYSTOSCOPY  09/06/2011   Procedure: CYSTOSCOPY;  Surgeon: Ailene Rud, MD;  Location: [facility];  Service: Urology;  Laterality: N/A;  LYNX SLING   . ESOPHAGOGASTRODUODENOSCOPY (EGD) WITH PROPOFOL N/A 09/27/2014   Procedure: ESOPHAGOGASTRODUODENOSCOPY (EGD) WITH PROPOFOL;  Surgeon: Gatha Mayer, MD;  [facility];  Service: Endoscopy;  Laterality: N/A;  . ESOPHAGOGASTRODUODENOSCOPY (EGD) WITH PROPOFOL N/A 03/11/2016   Procedure: ESOPHAGOGASTRODUODENOSCOPY (EGD) WITH PROPOFOL;  Surgeon: Jerene Bears, MD;  [facility];  Service: Endoscopy;  Laterality: N/A;  . GASTRIC ROUX-EN-Y N/A 10/04/2013   Procedure: LAPAROSCOPIC ROUX-EN-Y GASTRIC BYPASS WITH UPPER ENDOSCOPY;  Surgeon: Shann Medal, MD;  [facility];  Service: General;  Laterality: N/A;  . HEMORRHOID BANDING  02/2013  . PUBOVAGINAL SLING  09/06/2011   Procedure: Gaynelle Arabian;  Surgeon: Ailene Rud, MD;  Location: [facility];  Service: Urology;  Laterality: N/A;  . UVULOPALATOPHARYNGOPLASTY, TONSILLECTOMY AND SEPTOPLASTY  1994   Social History   Socioeconomic History  . Marital status: Single  Spouse name: Not on file  . Number of children: Not on file  . Years of education: Not on file  . Highest education level: Not on file  Occupational History  . Not on file  Social Needs  . Financial resource strain: Not on file  . Food insecurity:    Worry: Not on file    Inability: Not on file  . Transportation needs:    Medical: Not on file    Non-medical: Not on file  Tobacco Use  . Smoking status: Never Smoker  . Smokeless tobacco: Never Used  Substance and Sexual Activity  . Alcohol use: No    Alcohol/week: 0.0 oz  . Drug use: No  . Sexual  activity: Not Currently    Birth control/protection: Surgical    Comment: HYST.. 1st intercourse- 13, partners- 6  Lifestyle  . Physical activity:    Days per week: Not on file    Minutes per session: Not on file  . Stress: Not on file  Relationships  . Social connections:    Talks on phone: Not on file    Gets together: Not on file    Attends religious service: Not on file    Active member of club or organization: Not on file    Attends meetings of clubs or organizations: Not on file    Relationship status: Not on file  Other Topics Concern  . 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 Nsaids     r/t gastric surgery   Family History  Adopted: Yes  Family history unknown: Yes     Past medical history, social, surgical and family history all reviewed in electronic medical record.  No pertanent information unless stated regarding to the chief complaint.   Review of Systems:Review of systems updated and as accurate as of 09/09/17  No headache, visual changes, nausea, vomiting, diarrhea, constipation, dizziness, abdominal pain, skin rash, fevers, chills, night sweats, weight loss, swollen lymph nodes, body aches, joint swelling, muscle aches, chest pain, shortness of breath, mood changes.   Objective  Blood pressure 132/76, pulse (!) 59, height 5\' 2"  (1.575 m), weight 153 lb (69.4 kg), SpO2 98 %. Systems examined below as of 09/09/17   General: No apparent distress alert and oriented x3 mood and   Procedure: Real-time Ultrasound Guided Injection of right glenohumeral joint Device: GE Logiq Q7  Ultrasound guided injection is preferred based studies that show increased duration, increased effect, greater accuracy, decreased procedural pain, increased response rate with ultrasound guided versus blind  injection.  Verbal informed consent obtained.  Time-out conducted.  Noted no overlying erythema, induration, or other signs of local infection.  Skin prepped in a sterile fashion.  Local anesthesia: Topical Ethyl chloride.  With sterile technique and under real time ultrasound guidance:  Joint visualized.  23g 1  inch needle inserted posterior lateral approach. Pictures taken for needle placement. Patient did have injection of 2 cc of  Marcaine, and 4 cc of pre-centrifuge PRP Completed without difficulty  Pain immediately resolved suggesting accurate placement of the medication.  Advised to call if fevers/chills, erythema, induration, drainage, or persistent bleeding.  Images permanently stored and available for review in the ultrasound unit.  Impression: Technically successful ultrasound guided injection.    Impression and Recommendations:  This case required medical decision making of moderate complexity.      Note: This dictation was prepared with Dragon dictation along with smaller phrase technology. Any transcriptional errors that result from this process are unintentional.

## 2017-09-09 ENCOUNTER — Other Ambulatory Visit: Payer: 59

## 2017-09-09 ENCOUNTER — Ambulatory Visit: Payer: 59 | Admitting: Family Medicine

## 2017-09-09 ENCOUNTER — Encounter: Payer: Self-pay | Admitting: Family Medicine

## 2017-09-09 ENCOUNTER — Ambulatory Visit: Payer: Self-pay

## 2017-09-09 VITALS — BP 132/76 | HR 59 | Ht 62.0 in | Wt 153.0 lb

## 2017-09-09 DIAGNOSIS — M25511 Pain in right shoulder: Secondary | ICD-10-CM

## 2017-09-09 DIAGNOSIS — G8929 Other chronic pain: Secondary | ICD-10-CM

## 2017-09-09 DIAGNOSIS — S46011A Strain of muscle(s) and tendon(s) of the rotator cuff of right shoulder, initial encounter: Secondary | ICD-10-CM

## 2017-09-09 NOTE — Assessment & Plan Note (Signed)
Patient given injection today.  Tolerated the procedure well.  Patient is to avoid anti-inflammatories already.  Discussed icing regimen and home exercises.  Patient will try to avoid the icing at this moment for the next 72 to 96 hours.  Slow progression over the course the next 6 weeks.  Patient is scheduled for surgery still in June and if this does not seem to make a difference.  Follow-up again in 3 weeks

## 2017-09-09 NOTE — Patient Instructions (Signed)
Good to see you  We will try it and see how it goes.  Follow directions See me again in 3 weeks

## 2017-09-10 ENCOUNTER — Encounter: Payer: Self-pay | Admitting: Family Medicine

## 2017-09-11 ENCOUNTER — Encounter: Payer: Self-pay | Admitting: Family Medicine

## 2017-09-15 ENCOUNTER — Encounter: Payer: Self-pay | Admitting: *Deleted

## 2017-10-01 NOTE — Progress Notes (Signed)
[name] [address], [place] [address] Phone: [phone] Subjective:     CC: Right shoulder pain  IRJ:[id number]  Sandra [name] [name] is a 57 y.o. female coming in with complaint of right shoulder pain.  Patient initially did have a displaced distal clavicle fracture.  Patient did have a ultrasound showing a full-thickness tear of the supraspinatus.  Patient wanting to try all conservative options and was given PRP injections at last exam.  States that the pain is somewhat better but continues to have discomfort.  States that if anything she is about 20% better.     Past Medical History:  Diagnosis Date  . Anxiety   . Arthritis   . CAD (coronary artery disease) NON-OBSTRUCTIVE PER DR HOCHREIN NOTE 08-26-2011  . Cancer [facility]) 2006   uterine  . Complication of anesthesia PT STATES HAS LETTER FROM [place] [facility]. STATING THAT SHE HAS A VERY SMALL AIRWAY (LAST SURG. HYSTERECTOMY AT [facility] IN 2006   PT STATES Sandra BEASON CRNA WHOM WORKS HERE TOLD SHE WOULD BE OKAY TO BE DONE AT [facility]  . Diabetes mellitus    no meds taken now, checks cbg once or twice a week   . 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 GERD (gastroesophageal reflux disease)   . Heart murmur    with last visit, MD states "outgrown" murmur  . Hemorrhoids, internal, with bleeding and Grade 2 prolapse 03/05/2013   All positions seen an anoscopy RP columns banded 03/05/2013 LL and RA banded 03/18/2013     . History of endometrial cancer 2006 ---- S/P ABD. HYSTERECTOMY   STAGE I ENDOMETROID CARCINOMA  . Hx of adenomatous colonic polyps 2011   BENIGN  . Hyperlipidemia    no meds now  . Hypertension   . Morbid obesity, weight - 203, BMI - 38.4 05/12/2013  . OSA on CPAP    cpap setting of 16  . Sleep apnea    no CPAP since surgery   Past Surgical History:  Procedure Laterality Date  . ABDOMINAL HYSTERECTOMY  12-20-2004   TAH  . BILATERAL BREAST REDUCTION  11-16-1999  .  BREATH TEK H PYLORI N/A 05/27/2013   Procedure: BREATH TEK H PYLORI;  Surgeon: Shann Medal, MD;  [facility];  Service: General;  Laterality: N/A;  . [place]  . COLONOSCOPY    . COLONOSCOPY N/A 04/25/2014   Procedure: COLONOSCOPY;  Surgeon: Gatha Mayer, MD;  [facility];  Service: Endoscopy;  Laterality: N/A;  . CYSTOSCOPY  09/06/2011   Procedure: CYSTOSCOPY;  Surgeon: Ailene Rud, MD;  Location: [facility];  Service: Urology;  Laterality: N/A;  LYNX SLING   . ESOPHAGOGASTRODUODENOSCOPY (EGD) WITH PROPOFOL N/A 09/27/2014   Procedure: ESOPHAGOGASTRODUODENOSCOPY (EGD) WITH PROPOFOL;  Surgeon: Gatha Mayer, MD;  [facility];  Service: Endoscopy;  Laterality: N/A;  . ESOPHAGOGASTRODUODENOSCOPY (EGD) WITH PROPOFOL N/A 03/11/2016   Procedure: ESOPHAGOGASTRODUODENOSCOPY (EGD) WITH PROPOFOL;  Surgeon: Jerene Bears, MD;  [facility];  Service: Endoscopy;  Laterality: N/A;  . GASTRIC ROUX-EN-Y N/A 10/04/2013   Procedure: LAPAROSCOPIC ROUX-EN-Y GASTRIC BYPASS WITH UPPER ENDOSCOPY;  Surgeon: Shann Medal, MD;  [facility];  Service: General;  Laterality: N/A;  . HEMORRHOID BANDING  02/2013  . PUBOVAGINAL SLING  09/06/2011   Procedure: Gaynelle Arabian;  Surgeon: Ailene Rud, MD;  Location[facility];  Service: Urology;  Laterality: N/A;  .  UVULOPALATOPHARYNGOPLASTY, TONSILLECTOMY AND SEPTOPLASTY  1994   Social History   Socioeconomic History  . Marital status: Single    Spouse name: Not on file  . Number of children: Not on file  . Years of education: Not on file  . Highest education level: Not on file  Occupational History  . Not on file  Social Needs  . Financial resource strain: Not on file  . Food insecurity:    Worry: Not on file    Inability: Not on file  . Transportation needs:    Medical: Not on file    Non-medical: Not on file  Tobacco Use  . Smoking status:  Never Smoker  . Smokeless tobacco: Never Used  Substance and Sexual Activity  . Alcohol use: No    Alcohol/week: 0.0 oz  . Drug use: No  . Sexual activity: Not Currently    Birth control/protection: Surgical    Comment: HYST.. 1st intercourse- 13, partners- 6  Lifestyle  . Physical activity:    Days per week: Not on file    Minutes per session: Not on file  . Stress: Not on file  Relationships  . Social connections:    Talks on phone: Not on file    Gets together: Not on file    Attends religious service: Not on file    Active member of club or organization: Not on file    Attends meetings of clubs or organizations: Not on file    Relationship status: Not on file  Other Topics Concern  . Not on file  Social History Narrative   Married, second time, has children and cares for granddaughter   Active at work, no regular exercise   Endoscopy Tech at [employer]   Allergies  Allergen Reactions  . Nsaids     r/t gastric surgery   Family History  Adopted: Yes  Family history unknown: Yes     Past medical history, social, surgical and family history all reviewed in electronic medical record.  No pertanent information unless stated regarding to the chief complaint.   Review of Systems:Review of systems updated and as accurate as of 10/03/17  No headache, visual changes, nausea, vomiting, diarrhea, constipation, dizziness, abdominal pain, skin rash, fevers, chills, night sweats, weight loss, swollen lymph nodes, body aches, joint swelling, muscle aches, chest pain, shortness of breath, mood changes.   Objective  Blood pressure 140/84, pulse 67, height 5\' 2"  (1.575 m), weight 153 lb (69.4 kg), SpO2 96 %. Systems examined below as of 10/03/17   General: No apparent distress alert and oriented x3 mood and affect normal, dressed appropriately.  HEENT: Pupils equal, extraocular movements intact  Respiratory: Patient's speak in full sentences and does not appear short  of breath  Cardiovascular: No lower extremity edema, non tender, no erythema  Skin: Warm dry intact with no signs of infection or rash on extremities or on axial skeleton.  Abdomen: Soft nontender  Neuro: Cranial nerves II through XII are intact, neurovascularly intact in all extremities with 2+ DTRs and 2+ pulses.  Lymph: No lymphadenopathy of posterior or anterior cervical chain or axillae bilaterally.  Gait normal with good balance and coordination.  MSK:  Non tender with full range of motion and good stability and symmetric strength and tone of  elbows, wrist, hip, knee and ankles bilaterally.  Right shoulder shows patient still has discomfort with internal range of motion.  4 out of 5 strength in the rotator cuff compared to the contralateral side.  Positive painful arc still noted.  Grip strength intact  Contralateral shoulder unremarkable  Limited musculoskeletal ultrasound was performed and interpreted by Lyndal Pulley  Limited ultrasound of patient's supraspinatus tendon shows a full-thickness tear.  Minimal improvement in the retraction noted as well. Impression: No significant healing since PRP    Impression and Recommendations:     This case required medical decision making of moderate complexity.      Note: This dictation was prepared with Dragon dictation along with smaller phrase technology. Any transcriptional errors that result from this process are unintentional.

## 2017-10-03 ENCOUNTER — Ambulatory Visit: Payer: 59 | Admitting: Family Medicine

## 2017-10-03 ENCOUNTER — Encounter: Payer: Self-pay | Admitting: Family Medicine

## 2017-10-03 DIAGNOSIS — S46011D Strain of muscle(s) and tendon(s) of the rotator cuff of right shoulder, subsequent encounter: Secondary | ICD-10-CM | POA: Diagnosis not present

## 2017-10-03 DIAGNOSIS — Z01812 Encounter for preprocedural laboratory examination: Secondary | ICD-10-CM | POA: Diagnosis not present

## 2017-10-03 NOTE — Patient Instructions (Signed)
Good to see you  Alvera Singh is your friend.  Not the healing I would hope for  Continue what you are doing but I would continue to move forward with the surgery

## 2017-10-03 NOTE — Assessment & Plan Note (Signed)
Frustrated with patient at this time.  Encourage patient to follow through with the use possible surgical aspect of the right now for the rotator cuff.  Due to the retraction and did not feel that likely will improve with any other conservative therapy at this time.  Patient is in agreement with the plan.  Following up with me again as needed

## 2017-10-15 NOTE — Pre-Procedure Instructions (Signed)
Sandra [name]  10/15/2017      [place], [place] [address] Phone: [phone] Fax: [phone]    Your procedure is scheduled on Thursday 10/23/2017.  Report to [facility] at [place].M.  Call this number if you have problems the morning of surgery:  [phone]   Remember:  Do not eat or drink after midnight after midnight the night before your surgery.   Continue all medications as directed by your physician except follow these medication instructions before surgery below     Take these medicines the morning of surgery with A SIP OF WATER: Bupropion (Wellbutrin XL) Escitalopram (Lexapro) Ondansetron (Zofran ODT) - if needed  7 days prior to surgery STOP taking any Aspirin (unless otherwise instructed by your surgeon), Aleve, Naproxen, Ibuprofen, Motrin, Advil, Goody's, BC's, all herbal medications, fish oil, and all vitamins     Do not wear jewelry, make-up or nail polish.  Do not wear lotions, powders, or perfumes, or deodorant.  Do not shave 48 hours prior to surgery.    Do not bring valuables to the hospital.  [facility] is not responsible for any belongings or valuables.  Hearing aids, eyeglasses, contacts, dentures or bridgework may not be worn into surgery.  Leave your suitcase in the car.  After surgery it may be brought to your room.  For patients admitted to the hospital, discharge time will be determined by your treatment team.  Patients discharged the day of surgery will not be allowed to drive home.   Name and phone number of your driver:    Special instructions:   [place]- Preparing For Surgery  Before surgery, you can play an important role. Because skin is not sterile, your skin needs to be as free of germs as possible. You can reduce the number of germs on your skin by washing with CHG (chlorahexidine gluconate) Soap before surgery.   CHG is an antiseptic cleaner which kills germs and bonds with the skin to continue killing germs even after washing.    Oral Hygiene is also important to reduce your risk of infection.  Remember - BRUSH YOUR TEETH THE MORNING OF SURGERY WITH YOUR REGULAR TOOTHPASTE  Please do not use if you have an allergy to CHG or antibacterial soaps. If your skin becomes reddened/irritated stop using the CHG.  Do not shave (including legs and underarms) for at least 48 hours prior to first CHG shower. It is OK to shave your face.  Please follow these instructions carefully.   1. Shower the NIGHT BEFORE SURGERY and the MORNING OF SURGERY with CHG.   2. If you chose to wash your hair, wash your hair first as usual with your normal shampoo.  3. After you shampoo, rinse your hair and body thoroughly to remove the shampoo.  4. Use CHG as you would any other liquid soap. You can apply CHG directly to the skin and wash gently with a scrungie or a clean washcloth.   5. Apply the CHG Soap to your body ONLY FROM THE NECK DOWN.  Do not use on open wounds or open sores. Avoid contact with your eyes, ears, mouth and genitals (private parts). Wash Face and genitals (private parts)  with your normal soap.  6. Wash thoroughly, paying special attention to the area where your surgery will be performed.  7. Thoroughly rinse your body with warm water from the  neck down.  8. DO NOT shower/wash with your normal soap after using and rinsing off the CHG Soap.  9. Pat yourself dry with a CLEAN TOWEL.  10. Wear CLEAN PAJAMAS to bed the night before surgery, wear comfortable clothes the morning of surgery  11. Place CLEAN SHEETS on your bed the night of your first shower and DO NOT SLEEP WITH PETS.    Day of Surgery:  Do not apply any deodorants/lotions.  Please wear clean clothes to the hospital/surgery center.   Remember to brush your teeth WITH YOUR REGULAR TOOTHPASTE.    Please read over the following fact  sheets that you were given.

## 2017-10-16 ENCOUNTER — Other Ambulatory Visit: Payer: Self-pay

## 2017-10-16 ENCOUNTER — Encounter (HOSPITAL_COMMUNITY)
Admission: RE | Admit: 2017-10-16 | Discharge: 2017-10-16 | Disposition: A | Discharge status: Home / Self Care | Payer: 59 | Source: Ambulatory Visit | Attending: Orthopedic Surgery | Admitting: Orthopedic Surgery

## 2017-10-16 ENCOUNTER — Encounter (HOSPITAL_COMMUNITY): Payer: Self-pay

## 2017-10-16 DIAGNOSIS — Z01810 Encounter for preprocedural cardiovascular examination: Secondary | ICD-10-CM | POA: Diagnosis not present

## 2017-10-16 DIAGNOSIS — R001 Bradycardia, unspecified: Secondary | ICD-10-CM | POA: Insufficient documentation

## 2017-10-16 DIAGNOSIS — Z01812 Encounter for preprocedural laboratory examination: Secondary | ICD-10-CM | POA: Diagnosis not present

## 2017-10-16 DIAGNOSIS — I10 Essential (primary) hypertension: Secondary | ICD-10-CM | POA: Insufficient documentation

## 2017-10-16 DIAGNOSIS — I2510 Atherosclerotic heart disease of native coronary artery without angina pectoris: Secondary | ICD-10-CM | POA: Diagnosis not present

## 2017-10-16 HISTORY — DX: Major depressive disorder, single episode, unspecified: F32.9

## 2017-10-16 HISTORY — DX: Depression, unspecified: F32.A

## 2017-10-16 LAB — BASIC METABOLIC PANEL
Anion gap: 7 (ref 5–15)
BUN: 21 mg/dL — ABNORMAL HIGH (ref 6–20)
CO2: 31 mmol/L (ref 22–32)
Calcium: 9.7 mg/dL (ref 8.9–10.3)
Chloride: 107 mmol/L (ref 101–111)
Creatinine, Ser: 0.59 mg/dL (ref 0.44–1.00)
GFR calc Af Amer: >60 mL/min (ref >60)
GFR calc non Af Amer: >60 mL/min (ref >60)
Glucose, Bld: 132 mg/dL — ABNORMAL HIGH (ref 65–99)
Potassium: 3.7 mmol/L (ref 3.5–5.1)
Sodium: 145 mmol/L (ref 135–145)

## 2017-10-16 LAB — CBC
HCT: 43.8 % (ref 36.0–46.0)
Hemoglobin: 14 g/dL (ref 12.0–15.0)
MCH: 29.7 pg (ref 26.0–34.0)
MCHC: 32 g/dL (ref 30.0–36.0)
MCV: 93 fL (ref 78.0–100.0)
Platelets: 219 10*3/uL (ref 150–400)
RBC: 4.71 MIL/uL (ref 3.87–5.11)
RDW: 12.6 % (ref 11.5–15.5)
WBC: 5.9 10*3/uL (ref 4.0–10.5)

## 2017-10-16 LAB — HEMOGLOBIN A1C
Hgb A1c MFr Bld: 6.5 % — ABNORMAL HIGH (ref 4.8–5.6)
Mean Plasma Glucose: 139.85 mg/dL

## 2017-10-16 MED FILL — buPROPion HCL ER (XL) 150 M: 150 | 30 days supply | Qty: 30 | Fill #1

## 2017-10-16 MED FILL — ESCITALOPRAM 10 MG TABLET: 10 | 30 days supply | Qty: 30 | Fill #1

## 2017-10-16 NOTE — Progress Notes (Signed)
PCP - Dr. Billey Gosling  Cardiologist - Dr. Burt Knack   Chest x-ray - N/A EKG - 10/16/2017 Stress Test - 2002 ECHO - patient denies Cardiac Cath - patient denies  Sleep Study - Yes, positive study prior to gastric bypass. No issues with sleep apnea post surgery.   CPAP - patient denies  Fasting Blood Sugar - 90; no issues with diabetes since gastric bypass surgery. Checks Blood Sugar 1-2 times a month  Blood Thinner Instructions: N/A Aspirin Instructions: N/A  Anesthesia review: Yes  Patient denies shortness of breath, fever, cough and chest pain at PAT appointment   Patient verbalized understanding of instructions that were given to them at the PAT appointment. Patient was also instructed that they will need to review over the PAT instructions again at home before surgery.  Jacqlyn Larsen, RN

## 2017-10-17 ENCOUNTER — Encounter (HOSPITAL_COMMUNITY): Payer: Self-pay

## 2017-10-17 NOTE — Progress Notes (Signed)
Anesthesia Chart Review:   Case:  [id number] Date/Time:  10/23/17 0930   Procedure:  RIGHT SHOULDER ARTHROSCOPIC ROTATOR CUFF REPAIR VS. DEBRIDEMENT, SUBACROMIAL DECOMPARESSION, DISTAL CLAVICAL EXCISION (Right )   Anesthesia type:  Choice   Pre-op diagnosis:      RIGHT SHOULDER ROTATOR CUFF SYNDROME/IMPINGMENT,AC OSTEOARTHRITIS     M67.911, M19.011   Location:  [facility] ROOM 07 / [facility]   Surgeon:  Tania Ade, MD      DISCUSSION: Pt is a 57 year old female with hx:  - nonobstructive CAD (in LAD, as seen on CTA 2013) - heart murmur (no longer has; PCP note 09/05/17 documents "no murmur heard") - OSA (no CPAP since gastric bypass 2015)  - Anesthesia hx: Pt reports she was told she has a "small airway" when she had hysterectomy in 2006   VS: BP (!) 143/77   Pulse 69   Temp 37.1 C   Resp 18   Ht 5\' 2"  (1.575 m)   Wt 154 lb 14.4 oz (70.3 kg)   SpO2 100%   BMI 28.33 kg/m    PROVIDERS: PCP [name], MD   Saw cardiologist Sherren Mocha, MD 08/26/11 for eval chest pain.  Pt had been seen in ED for chest pain 08/15/11.  ACS ruled out, CTA identified nonobstructive CAD in LAD.  Pt saw Dr. Burt Knack on f/u.  No further cardiac testing ordered, prn f/u with cardiology recommended.    LABS: Labs reviewed: Acceptable for surgery. (all labs ordered are listed, but only abnormal results are displayed)  Labs Reviewed  HEMOGLOBIN A1C - Abnormal; Notable for the following components:      Result Value   Hgb A1c MFr Bld 6.5 (*)    All other components within normal limits  BASIC METABOLIC PANEL - Abnormal; Notable for the following components:   Glucose, Bld 132 (*)    BUN 21 (*)    All other components within normal limits  CBC     IMAGES:  CTA coronary morphology 08/16/11:  1. Mixed density nonocclusive atherosclerotic disease involving the left anterior descending artery. 2. Total coronary artery calcium score is 48, which is 91st percentile for patient's matched age and  gender. 3.  Right  coronary artery dominance. 4.  Left upper lobe pulmonary nodule. If the patient is at high risk for bronchogenic carcinoma, follow-up chest CT at 1 year is recommended.  If the patient is at low risk, no follow-up is needed.    EKG 10/16/17: Sinus bradycardia (58 bpm)   CV:  Past Medical History:  Diagnosis Date  . Anxiety   . Arthritis   . CAD (coronary artery disease)    nonobstructive CAD in LAD per 2013 CTA coronary morph  . Cancer [facility]) 2006   uterine  . Complication of anesthesia PT STATES HAS LETTER FROM [place] [facility]. STATING THAT SHE HAS A VERY SMALL AIRWAY (LAST SURG. HYSTERECTOMY AT [facility] IN 2006   PT STATES LYNN BEASON CRNA WHOM WORKS HERE TOLD SHE WOULD BE OKAY TO BE DONE AT [facility]  . Depression   . Diabetes mellitus    no meds taken now, checks cbg once or twice a week   . 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 GERD (gastroesophageal reflux disease)   . Heart murmur    with last visit, MD states "outgrown" murmur  . Hemorrhoids, internal, with bleeding and Grade 2 prolapse 03/05/2013   All positions seen an anoscopy RP columns banded 03/05/2013 LL  and RA banded 03/18/2013     . History of endometrial cancer 2006 ---- S/P ABD. HYSTERECTOMY   STAGE I ENDOMETROID CARCINOMA  . Hx of adenomatous colonic polyps 2011   BENIGN  . Hyperlipidemia    no meds now  . Hypertension   . Morbid obesity, weight - 203, BMI - 38.4 05/12/2013  . OSA on CPAP    cpap setting of 16  . Sleep apnea    no CPAP since surgery    Past Surgical History:  Procedure Laterality Date  . ABDOMINAL HYSTERECTOMY  12-20-2004   TAH  . BILATERAL BREAST REDUCTION  11-16-1999  . BREATH TEK H PYLORI N/A 05/27/2013   Procedure: BREATH TEK H PYLORI;  Surgeon: Shann Medal, MD;  Location: [facility];  Service: General;  Laterality: N/A;  . [place]  . COLONOSCOPY    . COLONOSCOPY N/A 04/25/2014   Procedure: COLONOSCOPY;  Surgeon: Gatha Mayer, MD;   Location: [facility];  Service: Endoscopy;  Laterality: N/A;  . CYSTOSCOPY  09/06/2011   Procedure: CYSTOSCOPY;  Surgeon: Ailene Rud, MD;  Location: [facility];  Service: Urology;  Laterality: N/A;  LYNX SLING   . ESOPHAGOGASTRODUODENOSCOPY (EGD) WITH PROPOFOL N/A 09/27/2014   Procedure: ESOPHAGOGASTRODUODENOSCOPY (EGD) WITH PROPOFOL;  Surgeon: Gatha Mayer, MD;  Location: [facility];  Service: Endoscopy;  Laterality: N/A;  . ESOPHAGOGASTRODUODENOSCOPY (EGD) WITH PROPOFOL N/A 03/11/2016   Procedure: ESOPHAGOGASTRODUODENOSCOPY (EGD) WITH PROPOFOL;  Surgeon: Jerene Bears, MD;  Location: [facility];  Service: Endoscopy;  Laterality: N/A;  . GASTRIC BYPASS  10/04/2013  . GASTRIC ROUX-EN-Y N/A 10/04/2013   Procedure: LAPAROSCOPIC ROUX-EN-Y GASTRIC BYPASS WITH UPPER ENDOSCOPY;  Surgeon: Shann Medal, MD;  Location: [facility];  Service: General;  Laterality: N/A;  . HEMORRHOID BANDING  02/2013  . PUBOVAGINAL SLING  09/06/2011   Procedure: Gaynelle Arabian;  Surgeon: Ailene Rud, MD;  Location: [facility];  Service: Urology;  Laterality: N/A;  . UVULOPALATOPHARYNGOPLASTY, TONSILLECTOMY AND SEPTOPLASTY  1994    MEDICATIONS: . buPROPion (WELLBUTRIN XL) 150 MG 24 hr tablet  . Calcium Carb-Cholecalciferol (CALCIUM PLUS VITAMIN D3 PO)  . Cyanocobalamin (B-12) 1000 MCG SUBL  . escitalopram (LEXAPRO) 10 MG tablet  . linaclotide (LINZESS) 145 MCG CAPS capsule  . Multiple Vitamins-Minerals (MULTIVITAMIN PO)  . nitroGLYCERIN (NITRODUR - DOSED IN MG/24 HR) 0.2 mg/hr patch  . ondansetron (ZOFRAN ODT) 4 MG disintegrating tablet  . Vitamin D, Ergocalciferol, (DRISDOL) 50000 units CAPS capsule   No current facility-administered medications for this encounter.     If no changes, I anticipate pt can proceed with surgery as scheduled.   Willeen Cass, FNP-BC [facility]/Anesthesiology Phone: [phone] 10/17/2017 2:04  PM

## 2017-10-23 ENCOUNTER — Encounter (HOSPITAL_COMMUNITY)
Admission: RE | Disposition: A | Discharge status: Home / Self Care | Payer: Self-pay | Source: Ambulatory Visit | Attending: Orthopedic Surgery

## 2017-10-23 ENCOUNTER — Ambulatory Visit (HOSPITAL_COMMUNITY): Payer: 59 | Admitting: Emergency Medicine

## 2017-10-23 ENCOUNTER — Ambulatory Visit (HOSPITAL_COMMUNITY): Payer: 59 | Admitting: Certified Registered Nurse Anesthetist

## 2017-10-23 ENCOUNTER — Ambulatory Visit (HOSPITAL_COMMUNITY)
Admission: RE | Admit: 2017-10-23 | Discharge: 2017-10-23 | Disposition: A | Discharge status: Home / Self Care | Payer: 59 | Source: Ambulatory Visit | Attending: Orthopedic Surgery | Admitting: Orthopedic Surgery

## 2017-10-23 ENCOUNTER — Encounter (HOSPITAL_COMMUNITY): Payer: Self-pay | Admitting: *Deleted

## 2017-10-23 ENCOUNTER — Encounter: Payer: 59 | Admitting: Obstetrics & Gynecology

## 2017-10-23 DIAGNOSIS — E785 Hyperlipidemia, unspecified: Secondary | ICD-10-CM | POA: Insufficient documentation

## 2017-10-23 DIAGNOSIS — M7541 Impingement syndrome of right shoulder: Secondary | ICD-10-CM | POA: Insufficient documentation

## 2017-10-23 DIAGNOSIS — M75111 Incomplete rotator cuff tear or rupture of right shoulder, not specified as traumatic: Secondary | ICD-10-CM | POA: Diagnosis not present

## 2017-10-23 DIAGNOSIS — F329 Major depressive disorder, single episode, unspecified: Secondary | ICD-10-CM | POA: Diagnosis not present

## 2017-10-23 DIAGNOSIS — S42031A Displaced fracture of lateral end of right clavicle, initial encounter for closed fracture: Secondary | ICD-10-CM | POA: Diagnosis not present

## 2017-10-23 DIAGNOSIS — F419 Anxiety disorder, unspecified: Secondary | ICD-10-CM | POA: Insufficient documentation

## 2017-10-23 DIAGNOSIS — S43431A Superior glenoid labrum lesion of right shoulder, initial encounter: Secondary | ICD-10-CM | POA: Diagnosis not present

## 2017-10-23 DIAGNOSIS — E119 Type 2 diabetes mellitus without complications: Secondary | ICD-10-CM | POA: Insufficient documentation

## 2017-10-23 DIAGNOSIS — G4733 Obstructive sleep apnea (adult) (pediatric): Secondary | ICD-10-CM | POA: Diagnosis not present

## 2017-10-23 DIAGNOSIS — I10 Essential (primary) hypertension: Secondary | ICD-10-CM | POA: Insufficient documentation

## 2017-10-23 DIAGNOSIS — M75101 Unspecified rotator cuff tear or rupture of right shoulder, not specified as traumatic: Secondary | ICD-10-CM | POA: Diagnosis not present

## 2017-10-23 DIAGNOSIS — Z79899 Other long term (current) drug therapy: Secondary | ICD-10-CM | POA: Diagnosis not present

## 2017-10-23 DIAGNOSIS — I2510 Atherosclerotic heart disease of native coronary artery without angina pectoris: Secondary | ICD-10-CM | POA: Diagnosis not present

## 2017-10-23 DIAGNOSIS — K219 Gastro-esophageal reflux disease without esophagitis: Secondary | ICD-10-CM | POA: Diagnosis not present

## 2017-10-23 DIAGNOSIS — G8918 Other acute postprocedural pain: Secondary | ICD-10-CM | POA: Diagnosis not present

## 2017-10-23 DIAGNOSIS — M13811 Other specified arthritis, right shoulder: Secondary | ICD-10-CM | POA: Insufficient documentation

## 2017-10-23 HISTORY — PX: SHOULDER ARTHROSCOPY: SHX128

## 2017-10-23 LAB — GLUCOSE, CAPILLARY
Glucose-Capillary: 144 mg/dL — ABNORMAL HIGH (ref 70–99)
Glucose-Capillary: 115 mg/dL — ABNORMAL HIGH (ref 70–99)

## 2017-10-23 SURGERY — ARTHROSCOPY, SHOULDER
Anesthesia: General | Site: Shoulder | Laterality: Right

## 2017-10-23 MED ORDER — SUGAMMADEX SODIUM 200 MG/2ML IV SOLN
INTRAVENOUS | Status: DC | PRN
  Administered 2017-10-23: 200 mg via INTRAVENOUS

## 2017-10-23 MED ORDER — ONDANSETRON HCL 4 MG/2ML IJ SOLN
INTRAMUSCULAR | Status: AC
  Filled 2017-10-23: qty 2

## 2017-10-23 MED ORDER — DEXAMETHASONE SODIUM PHOSPHATE 10 MG/ML IJ SOLN
INTRAMUSCULAR | Status: DC | PRN
  Administered 2017-10-23: 5 mg via INTRAVENOUS

## 2017-10-23 MED ORDER — PHENYLEPHRINE 40 MCG/ML (10ML) SYRINGE FOR IV PUSH (FOR BLOOD PRESSURE SUPPORT)
PREFILLED_SYRINGE | INTRAVENOUS | Status: DC | PRN
  Administered 2017-10-23 (×2): 80 ug via INTRAVENOUS

## 2017-10-23 MED ORDER — FENTANYL CITRATE (PF) 100 MCG/2ML IJ SOLN
INTRAMUSCULAR | Status: AC
  Administered 2017-10-23: 50 ug via INTRAVENOUS
  Filled 2017-10-23: qty 2

## 2017-10-23 MED ORDER — PROMETHAZINE HCL 25 MG/ML IJ SOLN: 6.2500 mg | INTRAMUSCULAR | Status: DC | PRN

## 2017-10-23 MED ORDER — FENTANYL CITRATE (PF) 100 MCG/2ML IJ SOLN
50.0000 ug | Freq: Once | INTRAMUSCULAR | Status: AC
  Administered 2017-10-23: 50 ug via INTRAVENOUS

## 2017-10-23 MED ORDER — BUPIVACAINE LIPOSOME 1.3 % IJ SUSP
INTRAMUSCULAR | Status: DC | PRN
  Administered 2017-10-23: 10 mL via PERINEURAL

## 2017-10-23 MED ORDER — MIDAZOLAM HCL 2 MG/2ML IJ SOLN
2.0000 mg | Freq: Once | INTRAMUSCULAR | Status: AC
  Administered 2017-10-23: 2 mg via INTRAVENOUS

## 2017-10-23 MED ORDER — FENTANYL CITRATE (PF) 250 MCG/5ML IJ SOLN
INTRAMUSCULAR | Status: AC
  Filled 2017-10-23: qty 5

## 2017-10-23 MED ORDER — BUPIVACAINE HCL (PF) 0.5 % IJ SOLN
INTRAMUSCULAR | Status: DC | PRN
  Administered 2017-10-23: 15 mL via PERINEURAL

## 2017-10-23 MED ORDER — LIDOCAINE 2% (20 MG/ML) 5 ML SYRINGE
INTRAMUSCULAR | Status: DC | PRN
  Administered 2017-10-23: 60 mg via INTRAVENOUS

## 2017-10-23 MED ORDER — LACTATED RINGERS IV SOLN
Freq: Once | INTRAVENOUS | Status: AC
  Administered 2017-10-23: 09:00:00 via INTRAVENOUS

## 2017-10-23 MED ORDER — OXYCODONE HCL 5 MG PO TABS: 5.0000 mg | ORAL_TABLET | Freq: Once | ORAL | Status: DC | PRN

## 2017-10-23 MED ORDER — CEFAZOLIN SODIUM-DEXTROSE 2-4 GM/100ML-% IV SOLN
2.0000 g | INTRAVENOUS | Status: AC
  Administered 2017-10-23: 2 g via INTRAVENOUS
  Filled 2017-10-23: qty 100

## 2017-10-23 MED ORDER — METHOCARBAMOL 500 MG PO TABS: 500.0000 mg | ORAL_TABLET | Freq: Three times a day (TID) | ORAL | 0 refills | Status: DC

## 2017-10-23 MED ORDER — EPHEDRINE SULFATE 50 MG/ML IJ SOLN
INTRAMUSCULAR | Status: DC | PRN
Start: 2017-10-23 — End: 2017-10-23
  Administered 2017-10-23: 10 mg via INTRAVENOUS

## 2017-10-23 MED ORDER — MIDAZOLAM HCL 2 MG/2ML IJ SOLN
INTRAMUSCULAR | Status: AC
  Filled 2017-10-23: qty 2

## 2017-10-23 MED ORDER — FENTANYL CITRATE (PF) 100 MCG/2ML IJ SOLN: 25.0000 ug | INTRAMUSCULAR | Status: DC | PRN

## 2017-10-23 MED ORDER — LACTATED RINGERS IV SOLN
INTRAVENOUS | Status: DC | PRN
  Administered 2017-10-23: 10:00:00 via INTRAVENOUS

## 2017-10-23 MED ORDER — PHENYLEPHRINE HCL 10 MG/ML IJ SOLN
INTRAMUSCULAR | Status: DC | PRN
  Administered 2017-10-23: 25 ug/min via INTRAVENOUS

## 2017-10-23 MED ORDER — ROCURONIUM BROMIDE 50 MG/5ML IV SOLN
INTRAVENOUS | Status: AC
  Filled 2017-10-23: qty 1

## 2017-10-23 MED ORDER — PROPOFOL 10 MG/ML IV BOLUS
INTRAVENOUS | Status: AC
  Filled 2017-10-23: qty 20

## 2017-10-23 MED ORDER — ONDANSETRON HCL 4 MG/2ML IJ SOLN
INTRAMUSCULAR | Status: DC | PRN
  Administered 2017-10-23: 4 mg via INTRAVENOUS

## 2017-10-23 MED ORDER — DOCUSATE SODIUM 100 MG PO CAPS: 100.0000 mg | ORAL_CAPSULE | Freq: Three times a day (TID) | ORAL | 0 refills | Status: DC | PRN

## 2017-10-23 MED ORDER — OXYCODONE HCL 5 MG/5ML PO SOLN: 5.0000 mg | Freq: Once | ORAL | Status: DC | PRN

## 2017-10-23 MED ORDER — DEXAMETHASONE SODIUM PHOSPHATE 10 MG/ML IJ SOLN
INTRAMUSCULAR | Status: AC
  Filled 2017-10-23: qty 1

## 2017-10-23 MED ORDER — SUGAMMADEX SODIUM 200 MG/2ML IV SOLN
INTRAVENOUS | Status: AC
  Filled 2017-10-23: qty 2

## 2017-10-23 MED ORDER — ROCURONIUM BROMIDE 10 MG/ML (PF) SYRINGE
PREFILLED_SYRINGE | INTRAVENOUS | Status: DC | PRN
  Administered 2017-10-23: 50 mg via INTRAVENOUS

## 2017-10-23 MED ORDER — POVIDONE-IODINE 7.5 % EX SOLN: Freq: Once | CUTANEOUS | Status: DC

## 2017-10-23 MED ORDER — BUPIVACAINE-EPINEPHRINE (PF) 0.25% -1:200000 IJ SOLN
INTRAMUSCULAR | Status: AC
  Filled 2017-10-23: qty 30

## 2017-10-23 MED ORDER — SODIUM CHLORIDE 0.9 % IR SOLN
Status: DC | PRN
  Administered 2017-10-23 (×2): 3000 mL

## 2017-10-23 MED ORDER — MIDAZOLAM HCL 2 MG/2ML IJ SOLN
INTRAMUSCULAR | Status: AC
  Administered 2017-10-23: 2 mg via INTRAVENOUS
  Filled 2017-10-23: qty 2

## 2017-10-23 MED ORDER — PROPOFOL 10 MG/ML IV BOLUS
INTRAVENOUS | Status: DC | PRN
  Administered 2017-10-23: 40 mg via INTRAVENOUS
  Administered 2017-10-23: 160 mg via INTRAVENOUS

## 2017-10-23 MED ORDER — OXYCODONE-ACETAMINOPHEN 5-325 MG PO TABS: 1.0000 | ORAL_TABLET | ORAL | 0 refills | Status: DC | PRN

## 2017-10-23 MED FILL — METHOCARBAMOL 500 MG TABLET: 500 | 10 days supply | Qty: 30 | Fill #0

## 2017-10-23 MED FILL — OXYCODONE-ACETAMINOPHEN 5-3: 5-325 | 5 days supply | Qty: 30 | Fill #0

## 2017-10-23 SURGICAL SUPPLY — 69 items
BLADE LONG MED 31X9 (MISCELLANEOUS) IMPLANT
BLADE SURG 11 STRL SS (BLADE) ×2 IMPLANT
BUR OVAL 4.0 (BURR) ×2 IMPLANT
BURR OVAL 8 FLU 4.0X13 (MISCELLANEOUS) ×2 IMPLANT
CANISTER OMNI JUG 16 LITER (MISCELLANEOUS) ×2 IMPLANT
CANNULA 5.75X71 LONG (CANNULA) ×2 IMPLANT
CHLORAPREP W/TINT 26ML (MISCELLANEOUS) ×2 IMPLANT
COVER SURGICAL LIGHT HANDLE (MISCELLANEOUS) ×2 IMPLANT
CUTTER BONE 4.0MM X 13CM (MISCELLANEOUS) ×2 IMPLANT
DRAPE IMP U-DRAPE 54X76 (DRAPES) ×2 IMPLANT
DRAPE INCISE IOBAN 66X45 STRL (DRAPES) ×2 IMPLANT
DRAPE ORTHO SPLIT 77X108 STRL (DRAPES) ×2
DRAPE STERI 35X30 U-POUCH (DRAPES) ×2 IMPLANT
DRAPE SURG ORHT 6 SPLT 77X108 (DRAPES) ×2 IMPLANT
DRAPE U-SHAPE 47X51 STRL (DRAPES) ×2 IMPLANT
DRILL BIT PANALOK RC 3.2 (BIT) IMPLANT
DRSG EMULSION OIL 3X3 NADH (GAUZE/BANDAGES/DRESSINGS) ×4 IMPLANT
DRSG PAD ABDOMINAL 8X10 ST (GAUZE/BANDAGES/DRESSINGS) ×6 IMPLANT
ELECT CAUTERY BLADE 6.4 (BLADE) IMPLANT
ELECT NEEDLE TIP 2.8 STRL (NEEDLE) IMPLANT
ELECT REM PT RETURN 9FT ADLT (ELECTROSURGICAL)
ELECTRODE REM PT RTRN 9FT ADLT (ELECTROSURGICAL) IMPLANT
GAUZE SPONGE 4X4 12PLY STRL (GAUZE/BANDAGES/DRESSINGS) ×2 IMPLANT
GAUZE XEROFORM 1X8 LF (GAUZE/BANDAGES/DRESSINGS) ×2 IMPLANT
GLOVE BIO SURGEON STRL SZ7 (GLOVE) ×4 IMPLANT
GLOVE BIO SURGEON STRL SZ7.5 (GLOVE) ×2 IMPLANT
GLOVE BIOGEL PI IND STRL 7.0 (GLOVE) ×1 IMPLANT
GLOVE BIOGEL PI IND STRL 8 (GLOVE) ×1 IMPLANT
GLOVE BIOGEL PI INDICATOR 7.0 (GLOVE) ×1
GLOVE BIOGEL PI INDICATOR 8 (GLOVE) ×1
GLOVE SURG SS PI 7.0 STRL IVOR (GLOVE) ×2 IMPLANT
GOWN STRL REUS W/ TWL LRG LVL3 (GOWN DISPOSABLE) ×3 IMPLANT
GOWN STRL REUS W/TWL LRG LVL3 (GOWN DISPOSABLE) ×3
KIT BASIN OR (CUSTOM PROCEDURE TRAY) ×2 IMPLANT
KIT TURNOVER KIT B (KITS) ×2 IMPLANT
MANIFOLD NEPTUNE II (INSTRUMENTS) ×2 IMPLANT
NDL SUT 6 .5 CRC .975X.05 MAYO (NEEDLE) IMPLANT
NEEDLE HYPO 25GX1X1/2 BEV (NEEDLE) ×2 IMPLANT
NEEDLE MAYO TAPER (NEEDLE)
NEEDLE SPNL 18GX3.5 QUINCKE PK (NEEDLE) ×2 IMPLANT
NS IRRIG 1000ML POUR BTL (IV SOLUTION) ×2 IMPLANT
PACK SHOULDER (CUSTOM PROCEDURE TRAY) ×2 IMPLANT
PACK UNIVERSAL I (CUSTOM PROCEDURE TRAY) ×2 IMPLANT
PAD ARMBOARD 7.5X6 YLW CONV (MISCELLANEOUS) ×4 IMPLANT
PROBE BIPOLAR ATHRO 135MM 90D (MISCELLANEOUS) ×2 IMPLANT
RESECTOR FULL RADIUS 4.2MM (BLADE) ×2 IMPLANT
SET ARTHROSCOPY TUBING (MISCELLANEOUS) ×1
SET ARTHROSCOPY TUBING LN (MISCELLANEOUS) ×1 IMPLANT
SLING ARM FOAM STRAP LRG (SOFTGOODS) ×2 IMPLANT
SLING ARM FOAM STRAP MED (SOFTGOODS) ×2 IMPLANT
SPONGE LAP 4X18 RFD (DISPOSABLE) IMPLANT
SUCTION FRAZIER HANDLE 10FR (MISCELLANEOUS)
SUCTION TUBE FRAZIER 10FR DISP (MISCELLANEOUS) IMPLANT
SUPPORT WRAP ARM LG (MISCELLANEOUS) ×2 IMPLANT
SUT BONE WAX W31G (SUTURE) IMPLANT
SUT ETHIBOND 2 OS 4 DA (SUTURE) IMPLANT
SUT ETHIBOND NAB BRD #0 18IN (SUTURE) IMPLANT
SUT ETHILON 3 0 PS 1 (SUTURE) ×2 IMPLANT
SUT FIBERWIRE #2 38 T-5 BLUE (SUTURE)
SUT VIC AB 0 CT2 27 (SUTURE) IMPLANT
SUT VIC AB 2-0 CT1 27 (SUTURE)
SUT VIC AB 2-0 CT1 TAPERPNT 27 (SUTURE) IMPLANT
SUT VIC AB 2-0 FS1 27 (SUTURE) IMPLANT
SUTURE FIBERWR #2 38 T-5 BLUE (SUTURE) IMPLANT
SYR CONTROL 10ML LL (SYRINGE) ×2 IMPLANT
TOWEL OR 17X26 10 PK STRL BLUE (TOWEL DISPOSABLE) ×2 IMPLANT
TUBE CONNECTING 12X1/4 (SUCTIONS) ×4 IMPLANT
WAND HAND CNTRL MULTIVAC 90 (MISCELLANEOUS) ×2 IMPLANT
WATER STERILE IRR 1000ML POUR (IV SOLUTION) ×2 IMPLANT

## 2017-10-23 NOTE — Anesthesia Postprocedure Evaluation (Signed)
Anesthesia Post Note  Patient: [name]  Procedure(s) Performed: RIGHT SHOULDER ARTHROSCOPIC ROTATOR CUFF REPAIR VS. DEBRIDEMENT, SUBACROMIAL DECOMPARESSION, DISTAL CLAVICAL EXCISION (Right Shoulder)     Patient location during evaluation: PACU Anesthesia Type: General Level of consciousness: awake and alert Pain management: pain level controlled Vital Signs Assessment: post-procedure vital signs reviewed and stable Respiratory status: spontaneous breathing, nonlabored ventilation and respiratory function stable Cardiovascular status: blood pressure returned to baseline and stable Postop Assessment: no apparent nausea or vomiting Anesthetic complications: no    Last Vitals:  Vitals:   10/23/17 1143 10/23/17 1158  BP: 125/80 118/78  Pulse: 68 71  Resp: 15 (!) 22  Temp:    SpO2: 97% 99%    Last Pain:  Vitals:   10/23/17 1200  TempSrc:   PainSc: 0-No pain                 Audry Pili

## 2017-10-23 NOTE — Anesthesia Procedure Notes (Signed)
Procedure Name: Intubation Date/Time: 10/23/2017 10:22 AM Performed by: Colin Benton, CRNA Pre-anesthesia Checklist: Patient identified, Emergency Drugs available, Suction available and Patient being monitored Patient Re-evaluated:Patient Re-evaluated prior to induction Oxygen Delivery Method: Circle system utilized Preoxygenation: Pre-oxygenation with 100% oxygen Induction Type: IV induction Ventilation: Mask ventilation without difficulty Laryngoscope Size: Glidescope and 4 Grade View: Grade I Tube type: Oral Tube size: 7.0 mm Number of attempts: 2 Airway Equipment and Method: Rigid stylet and Video-laryngoscopy Placement Confirmation: ETT inserted through vocal cords under direct vision,  positive ETCO2 and breath sounds checked- equal and bilateral Secured at: 21 cm Tube secured with: Tape Dental Injury: Teeth and Oropharynx as per pre-operative assessment  Difficulty Due To: Difficulty was anticipated and Difficult Airway- due to limited oral opening Comments: Positive BMV.  DL x1 with Mil 2.  Grade 3 view.  Decision made to use glidescope. Positive BMV in between DL attempts. DL x2 with glidescope 4.  EBBS and VSS.

## 2017-10-23 NOTE — Discharge Instructions (Signed)

## 2017-10-23 NOTE — Anesthesia Procedure Notes (Signed)
Anesthesia Regional Block: Interscalene brachial plexus block   Pre-Anesthetic Checklist: ,, timeout performed, Correct Patient, Correct Site, Correct Laterality, Correct Procedure, Correct Position, site marked, Risks and benefits discussed,  Surgical consent,  Pre-op evaluation,  At surgeon's request and post-op pain management  Laterality: Right  Prep: chloraprep       Needles:  Injection technique: Single-shot  Needle Type: Echogenic Needle     Needle Length: 9cm  Needle Gauge: 21     Additional Needles:   Narrative:  Start time: 10/23/2017 9:22 AM End time: 10/23/2017 9:26 AM Injection made incrementally with aspirations every 5 mL.  Performed by: Personally  Anesthesiologist: Audry Pili, MD  Additional Notes: No pain on injection. No increased resistance to injection. Injection made in 5cc increments. Good needle visualization. Patient tolerated the procedure well.

## 2017-10-23 NOTE — Transfer of Care (Signed)
Immediate Anesthesia Transfer of Care Note  Patient: Sandra [name]  Procedure(s) Performed: RIGHT SHOULDER ARTHROSCOPIC ROTATOR CUFF REPAIR VS. DEBRIDEMENT, SUBACROMIAL DECOMPARESSION, DISTAL CLAVICAL EXCISION (Right Shoulder)  Patient Location: PACU  Anesthesia Type:GA combined with regional for post-op pain  Level of Consciousness: awake, alert , oriented and patient cooperative  Airway & Oxygen Therapy: Patient Spontanous Breathing and Patient connected to nasal cannula oxygen  Post-op Assessment: Report given to RN and Post -op Vital signs reviewed and stable  Post vital signs: Reviewed and stable  Last Vitals:  Vitals Value Taken Time  BP    Temp 36.5 C 10/23/2017 11:28 AM  Pulse 82 10/23/2017 11:29 AM  Resp 18 10/23/2017 11:29 AM  SpO2 99 % 10/23/2017 11:29 AM  Vitals shown include unvalidated device data.  Last Pain:  Vitals:   10/23/17 1128  TempSrc:   PainSc: 0-No pain      Patients Stated Pain Goal: 2 (80/16/55 3748)  Complications: No apparent anesthesia complications

## 2017-10-23 NOTE — Anesthesia Preprocedure Evaluation (Addendum)
Anesthesia Evaluation  Patient identified by MRN, date of birth, ID band Patient awake    Reviewed: Allergy & Precautions, NPO status , Patient's Chart, lab work & pertinent test results  History of Anesthesia Complications (+) history of anesthetic complications  Airway Mallampati: III   Neck ROM: Full    Dental  (+) Teeth Intact, Dental Advisory Given   Pulmonary sleep apnea ,    breath sounds clear to auscultation       Cardiovascular hypertension, + CAD   Rhythm:Regular Rate:Normal     Neuro/Psych PSYCHIATRIC DISORDERS Anxiety Depression negative neurological ROS     GI/Hepatic Neg liver ROS, PUD, GERD  Medicated and Controlled,S/p gastric bypass   Endo/Other  diabetes, Well Controlled  Renal/GU negative Renal ROS  negative genitourinary   Musculoskeletal  (+) Arthritis , Osteoarthritis,    Abdominal   Peds negative pediatric ROS (+)  Hematology negative hematology ROS (+)   Anesthesia Other Findings   Reproductive/Obstetrics Uterine cancer                            02/2016 EKG: normal sinus rhythm.  Anesthesia Physical  Anesthesia Plan  ASA: III  Anesthesia Plan: General   Post-op Pain Management:  Regional for Post-op pain   Induction: Intravenous  PONV Risk Score and Plan: 3 and Treatment may vary due to age or medical condition, Ondansetron, Dexamethasone and Midazolam  Airway Management Planned: Oral ETT and Video Laryngoscope Planned  Additional Equipment: None  Intra-op Plan:   Post-operative Plan: Extubation in OR  Informed Consent: I have reviewed the patients History and Physical, chart, labs and discussed the procedure including the risks, benefits and alternatives for the proposed anesthesia with the patient or authorized representative who has indicated his/her understanding and acceptance.   Dental advisory given  Plan Discussed with: CRNA and  Anesthesiologist  Anesthesia Plan Comments:        Anesthesia Quick Evaluation

## 2017-10-23 NOTE — H&P (Addendum)
Sandra [name] is an 57 y.o. female.   Chief Complaint: Right shoulder pain and dysfunction   HPI: the patient is a 57 year old female with a long history of right shoulder pain with severe AC joint arthropathy and impingement, failed extensive conservative management.  Past Medical History:  Diagnosis Date  . Anxiety   . Arthritis   . CAD (coronary artery disease)    nonobstructive CAD in LAD per 2013 CTA coronary morph  . Cancer [facility]) 2006   uterine  . Complication of anesthesia PT STATES HAS LETTER FROM [place] [facility]. STATING THAT SHE HAS A VERY SMALL AIRWAY (LAST SURG. HYSTERECTOMY AT [facility] IN 2006   PT STATES LYNN BEASON CRNA WHOM WORKS HERE TOLD SHE WOULD BE OKAY TO BE DONE AT [facility]  . Depression   . Diabetes mellitus    no meds taken now, checks cbg once or twice a week   . 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 GERD (gastroesophageal reflux disease)   . Heart murmur    with last visit, MD states "outgrown" murmur  . Hemorrhoids, internal, with bleeding and Grade 2 prolapse 03/05/2013   All positions seen an anoscopy RP columns banded 03/05/2013 LL and RA banded 03/18/2013     . History of endometrial cancer 2006 ---- S/P ABD. HYSTERECTOMY   STAGE I ENDOMETROID CARCINOMA  . Hx of adenomatous colonic polyps 2011   BENIGN  . Hyperlipidemia    no meds now  . Hypertension   . Morbid obesity, weight - 203, BMI - 38.4 05/12/2013  . OSA on CPAP    cpap setting of 16  . Sleep apnea    no CPAP since surgery    Past Surgical History:  Procedure Laterality Date  . ABDOMINAL HYSTERECTOMY  12-20-2004   TAH  . BILATERAL BREAST REDUCTION  11-16-1999  . BREATH TEK H PYLORI N/A 05/27/2013   Procedure: BREATH TEK H PYLORI;  Surgeon: Shann Medal, MD;  [facility];  Service: General;  Laterality: N/A;  . [place]  . COLONOSCOPY    . COLONOSCOPY N/A 04/25/2014   Procedure: COLONOSCOPY;  Surgeon: Gatha Mayer, MD;  Location[facility];   Service: Endoscopy;  Laterality: N/A;  . CYSTOSCOPY  09/06/2011   Procedure: CYSTOSCOPY;  Surgeon: Ailene Rud, MD;  Location: [facility];  Service: Urology;  Laterality: N/A;  LYNX SLING   . ESOPHAGOGASTRODUODENOSCOPY (EGD) WITH PROPOFOL N/A 09/27/2014   Procedure: ESOPHAGOGASTRODUODENOSCOPY (EGD) WITH PROPOFOL;  Surgeon: Gatha Mayer, MD;  [facility];  Service: Endoscopy;  Laterality: N/A;  . ESOPHAGOGASTRODUODENOSCOPY (EGD) WITH PROPOFOL N/A 03/11/2016   Procedure: ESOPHAGOGASTRODUODENOSCOPY (EGD) WITH PROPOFOL;  Surgeon: Jerene Bears, MD;  [facility];  Service: Endoscopy;  Laterality: N/A;  . GASTRIC BYPASS  10/04/2013  . GASTRIC ROUX-EN-Y N/A 10/04/2013   Procedure: LAPAROSCOPIC ROUX-EN-Y GASTRIC BYPASS WITH UPPER ENDOSCOPY;  Surgeon: Shann Medal, MD;  Location[facility];  Service: General;  Laterality: N/A;  . HEMORRHOID BANDING  02/2013  . PUBOVAGINAL SLING  09/06/2011   Procedure: Gaynelle Arabian;  Surgeon: Ailene Rud, MD;  Location: [facility];  Service: Urology;  Laterality: N/A;  . UVULOPALATOPHARYNGOPLASTY, TONSILLECTOMY AND SEPTOPLASTY  1994    Family History  Adopted: Yes  Family history unknown: Yes   Social History:  reports that she has never smoked. She has never used smokeless tobacco. She reports that she does not drink alcohol or use drugs.  Allergies:  Allergies  Allergen Reactions  . Nsaids     r/t gastric surgery    Medications Prior to Admission  Medication Sig Dispense Refill  . buPROPion (WELLBUTRIN XL) 150 MG 24 hr tablet Take 1 tablet (150 mg total) by mouth daily. 30 tablet 5  . Calcium Carb-Cholecalciferol (CALCIUM PLUS VITAMIN D3 PO) Take 1 tablet by mouth 3 (three) times daily.     . Cyanocobalamin (B-12) 1000 MCG SUBL Place 1,000 mcg under the tongue daily.    Marland Kitchen escitalopram (LEXAPRO) 10 MG tablet Take 1 tablet (10 mg total) by mouth daily. 30 tablet 5  . linaclotide  (LINZESS) 145 MCG CAPS capsule Take 1 capsule (145 mcg total) by mouth daily before breakfast. (Patient taking differently: Take 145 mcg by mouth daily as needed (constipation). ) 30 capsule 11  . Multiple Vitamins-Minerals (MULTIVITAMIN PO) Take 1 tablet by mouth 2 (two) times daily.     . Vitamin D, Ergocalciferol, (DRISDOL) 50000 units CAPS capsule TAKE 1 CAPSULE BY MOUTH EVERY 7 DAYS 12 capsule 0  . nitroGLYCERIN (NITRODUR - DOSED IN MG/24 HR) 0.2 mg/hr patch 1/4 patch daily 30 patch 1  . ondansetron (ZOFRAN ODT) 4 MG disintegrating tablet Take 1 tablet (4 mg total) by mouth every 8 (eight) hours as needed for nausea or vomiting (May take 2 if one ineffective). 30 tablet 3    Results for orders placed or performed during the hospital encounter of 10/23/17 (from the past 48 hour(s))  Glucose, capillary     Status: Abnormal   Collection Time: 10/23/17  8:05 AM  Result Value Ref Range   Glucose-Capillary 144 (H) 70 - 99 mg/dL   No results found.  Review of Systems  All other systems reviewed and are negative.   Blood pressure (!) 145/77, pulse 70, temperature 98.5 F (36.9 C), temperature source Oral, resp. rate 20, height 5\' 2"  (1.575 m), weight 70.3 kg (154 lb 14.4 oz), SpO2 98 %. Physical Exam  Constitutional: She appears well-developed and well-nourished.  HENT:  Head: Atraumatic.  Eyes: EOM are normal.  Cardiovascular: Intact distal pulses.  Respiratory: Effort normal.  Musculoskeletal:  Right shoulder pain with impingement testing, AC joint tenderness to palpation.  Skin: Skin is warm and dry.  Psychiatric: She has a normal mood and affect.     Assessment/Plan the patient is a 57 year old female with a long history of right shoulder pain with severe AC joint arthropathy and impingement, failed extensive conservative management. Plan right shoulder arthroscopic debridement rotator cuff, subacromial decompression, distal clavicle excision Risks / benefits of surgery  discussed Consent on chart  NPO for OR Preop antibiotics   Isabella Stalling, MD 10/23/2017, 9:22 AM

## 2017-10-23 NOTE — Op Note (Signed)
Procedure(s):   Sandra [name] female 57 y.o. 10/23/2017  Procedure(s) and Anesthesia Type:    #1 RIGHT SHOULDER ARTHROSCOPIC ROTATOR CUFF AND LABRAL DEBRIDEMENT,     #2 SUBACROMIAL DECOMPARESSION,     #3 DISTAL CLAVICAL EXCISION -  Postoperative diagnosis: #1 right shoulder partial rotator cuff tear and SLAP tear #2 right shoulder impingement #3 right shoulder AC joint arthropathy  Surgeon(s) and Role:    Tania Ade, MD - Primary     Surgeon: Isabella Stalling   Assistants: Jeanmarie Hubert PA-C (Danielle was present and scrubbed throughout the procedure and was essential in positioning, assisting with the camera and instrumentation,, and closure)  Anesthesia: General endotracheal anesthesia with preoperative interscalene block given by the attending anesthesiologist    Procedure Detail    Estimated Blood Loss: Min         Drains: none  Blood Given: none         Specimens: none        Complications:  * No complications entered in OR log *         Disposition: PACU - hemodynamically stable.         Condition: stable    Procedure:   INDICATIONS FOR SURGERY: The patient is 57 y.o. female who has had ongoing right shoulder pain since suffering a right distal clavicle fracture which went on to heal appropriately.  She has had a combination of impingement type rotator cuff pain and AC joint pain which has been managed with rest medication and injections which gave her temporary improvement but not lasting relief.  Indicated for surgical treatment to try and decrease pain and restore function.  OPERATIVE FINDINGS: Examination under anesthesia: Mild stiffness noted no instability.  DESCRIPTION OF PROCEDURE: The patient was identified in preoperative  holding area where I personally marked the operative site after  verifying site, side, and procedure with the patient. An interscalene block was given by the attending anesthesiologist the holding  area.  The patient was taken back to the operating room where general anesthesia was induced without complication and was placed in the beach-chair position with the back  elevated about 60 degrees and all extremities and head and neck carefully padded and  positioned.   The right upper extremity was then prepped and  draped in a standard sterile fashion. The appropriate time-out  procedure was carried out. The patient did receive IV antibiotics  within 30 minutes of incision.   A small posterior portal incision was made and the arthroscope was introduced into the joint. An anterior portal was then established above the subscapularis using needle localization. Small cannula was placed anteriorly. Diagnostic arthroscopy was then carried out.  The subscapularis was noted to be mildly frayed but intact.  There was extensive tearing of the superior labrum extending from about the 10:00 to 2 o'clock position.  This was debrided extensively with a shaver back to intact labrum.  The biceps anchor was probed and was noted to be intact.  The biceps tendon was pulled into the joint and noted to be intact.  The undersurface of the supraspinatus was examined and noted to have about 25% partial tearing from the tuberosity.  This was extensively debrided with a shaver back to healthy bleeding tendon.  This amount of tearing did not warrant formal repair and was felt to be appropriate for debridement alone.  The arthroscope was then introduced into the subacromial space a standard lateral portal was established with needle localization. The shaver  was used through the lateral portal to perform extensive bursectomy. Coracoacromial ligament was examined and found to be frayed indicating chronic impingement.  The bursal surface of the rotator cuff was examined and noted to be completely intact.  The coracoacromial ligament was taken down off the anterior acromion with the [employer]throCare exposing a downsloping  anterior acromial spur. A high-speed bur was then used through the lateral portal to take down the anterior acromial spur from lateral to medial in a standard acromioplasty.  The acromioplasty was also viewed from the lateral portal and the bur was used as necessary to ensure that the acromion was completely flat from posterior to anterior.  The distal clavicle was exposed arthroscopically and the bur was used to take off the undersurface for approximately 8 mm from the lateral portal. The bur was then moved to an anterior portal position to complete the distal clavicle excision resecting about 8 mm of the distal clavicle and a smooth even fashion. This was viewed from anterior and lateral portals and felt to be complete.  The arthroscopic equipment was removed from the joint and the portals were closed with 3-0 nylon in an interrupted fashion. Sterile dressings were then applied including Xeroform 4 x 4's ABDs and tape. The patient was then allowed to awaken from general anesthesia, placed in a sling, transferred to the stretcher and taken to the recovery room in stable condition.   POSTOPERATIVE PLAN: The patient will be discharged home today and will followup in one week for suture removal and wound check.

## 2017-10-24 ENCOUNTER — Encounter (HOSPITAL_COMMUNITY): Payer: Self-pay | Admitting: Orthopedic Surgery

## 2017-10-29 DIAGNOSIS — Z9889 Other specified postprocedural states: Secondary | ICD-10-CM | POA: Diagnosis not present

## 2017-11-10 ENCOUNTER — Ambulatory Visit: Payer: 59 | Admitting: Physical Therapy

## 2017-11-11 ENCOUNTER — Other Ambulatory Visit: Payer: Self-pay

## 2017-11-11 ENCOUNTER — Ambulatory Visit: Payer: 59 | Attending: Orthopedic Surgery | Admitting: Physical Therapy

## 2017-11-11 ENCOUNTER — Encounter: Payer: Self-pay | Admitting: Physical Therapy

## 2017-11-11 ENCOUNTER — Encounter: Payer: Self-pay | Admitting: Internal Medicine

## 2017-11-11 DIAGNOSIS — R29898 Other symptoms and signs involving the musculoskeletal system: Secondary | ICD-10-CM | POA: Insufficient documentation

## 2017-11-11 DIAGNOSIS — G8929 Other chronic pain: Secondary | ICD-10-CM | POA: Insufficient documentation

## 2017-11-11 DIAGNOSIS — M25611 Stiffness of right shoulder, not elsewhere classified: Secondary | ICD-10-CM | POA: Insufficient documentation

## 2017-11-11 DIAGNOSIS — M25511 Pain in right shoulder: Secondary | ICD-10-CM | POA: Diagnosis not present

## 2017-11-11 DIAGNOSIS — M6281 Muscle weakness (generalized): Secondary | ICD-10-CM | POA: Diagnosis not present

## 2017-11-11 NOTE — Therapy (Signed)
[facility] [address]  [place], [place], [address] Phone: [phone]   Fax:  [phone]  Physical Therapy Evaluation  Patient Details  Name: Sandra [name] MRN: [id number] Date of Birth: 05-26-1960 Referring Provider: Tania Ade, MD   Encounter Date: 11/11/2017  PT End of Session - 11/11/17 1001    Visit Number  1    Number of Visits  12    Date for PT Re-Evaluation  12/23/17    PT Start Time  0851    PT Stop Time  0933    PT Time Calculation (min)  42 min    Activity Tolerance  Patient tolerated treatment well;Patient limited by pain    Behavior During Therapy  [facility] for tasks assessed/performed       Past Medical History:  Diagnosis Date  . Anxiety   . Arthritis   . CAD (coronary artery disease)    nonobstructive CAD in LAD per 2013 CTA coronary morph  . Cancer [facility]) 2006   uterine  . Complication of anesthesia PT STATES HAS LETTER FROM [place] [facility]. STATING THAT SHE HAS A VERY SMALL AIRWAY (LAST SURG. HYSTERECTOMY AT [facility] IN 2006   PT STATES LYNN BEASON CRNA WHOM WORKS HERE TOLD SHE WOULD BE OKAY TO BE DONE AT [facility]  . Depression   . Diabetes mellitus    no meds taken now, checks cbg once or twice a week   . 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 GERD (gastroesophageal reflux disease)   . Heart murmur    with last visit, MD states "outgrown" murmur  . Hemorrhoids, internal, with bleeding and Grade 2 prolapse 03/05/2013   All positions seen an anoscopy RP columns banded 03/05/2013 LL and RA banded 03/18/2013     . History of endometrial cancer 2006 ---- S/P ABD. HYSTERECTOMY   STAGE I ENDOMETROID CARCINOMA  . Hx of adenomatous colonic polyps 2011   BENIGN  . Hyperlipidemia    no meds now  . Hypertension   . Morbid obesity, weight - 203, BMI - 38.4 05/12/2013  . OSA on CPAP    cpap setting of 16  . Sleep apnea    no CPAP since surgery    Past Surgical History:  Procedure  Laterality Date  . ABDOMINAL HYSTERECTOMY  12-20-2004   TAH  . BILATERAL BREAST REDUCTION  11-16-1999  . BREATH TEK H PYLORI N/A 05/27/2013   Procedure: BREATH TEK H PYLORI;  Surgeon: Shann Medal, MD;  Location: [facility];  Service: General;  Laterality: N/A;  . [place]  . COLONOSCOPY    . COLONOSCOPY N/A 04/25/2014   Procedure: COLONOSCOPY;  Surgeon: Gatha Mayer, MD;  Location: [facility];  Service: Endoscopy;  Laterality: N/A;  . CYSTOSCOPY  09/06/2011   Procedure: CYSTOSCOPY;  Surgeon: Ailene Rud, MD;  Location: [facility];  Service: Urology;  Laterality: N/A;  LYNX SLING   . ESOPHAGOGASTRODUODENOSCOPY (EGD) WITH PROPOFOL N/A 09/27/2014   Procedure: ESOPHAGOGASTRODUODENOSCOPY (EGD) WITH PROPOFOL;  Surgeon: Gatha Mayer, MD;  [facility];  Service: Endoscopy;  Laterality: N/A;  . ESOPHAGOGASTRODUODENOSCOPY (EGD) WITH PROPOFOL N/A 03/11/2016   Procedure: ESOPHAGOGASTRODUODENOSCOPY (EGD) WITH PROPOFOL;  Surgeon: Jerene Bears, MD;  [facility];  Service: Endoscopy;  Laterality: N/A;  . GASTRIC BYPASS  10/04/2013  . GASTRIC ROUX-EN-Y N/A 10/04/2013   Procedure: LAPAROSCOPIC ROUX-EN-Y GASTRIC BYPASS WITH UPPER ENDOSCOPY;  Surgeon: Shann Medal,  MD;  [facility];  Service: General;  Laterality: N/A;  . HEMORRHOID BANDING  02/2013  . PUBOVAGINAL SLING  09/06/2011   Procedure: Gaynelle Arabian;  Surgeon: Ailene Rud, MD;  Location: [facility];  Service: Urology;  Laterality: N/A;  . SHOULDER ARTHROSCOPY Right 10/23/2017   Procedure: RIGHT SHOULDER ARTHROSCOPIC ROTATOR CUFF REPAIR VS. DEBRIDEMENT, SUBACROMIAL DECOMPARESSION, DISTAL CLAVICAL EXCISION;  Surgeon: Tania Ade, MD;  Location: [facility];  Service: Orthopedics;  Laterality: Right;  . UVULOPALATOPHARYNGOPLASTY, TONSILLECTOMY AND SEPTOPLASTY  1994    There were no vitals filed for this visit.   Subjective Assessment - 11/11/17 0900     Subjective  Pt states that since the surgery she has been stubborn and probably doing more than she should be doing. Reported to the MD a few days after surgery that she was sweeping the floor due to family coming over as her mother recently passed away. Lives with 27 y/o granddaughter who helps her perform various ADLs around the house such as hanging up the laundry and putting things away in the cabinet.  Pt states she has either been doing things with her L arm or having her granddaughter performing activities that she is unable to do. 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     Limitations  Lifting;House hold activities    Patient Stated Goals  Get back to normal job activities, normal household activities    Currently in Pain?  Yes    Pain Score  4     Pain Location  Shoulder    Pain Orientation  Right    Pain Descriptors / Indicators  Throbbing    Pain Type  Chronic pain    Pain Onset  More than a month ago    Pain Frequency  Constant    Aggravating Factors   Movement, Lifting things overhead    Pain Relieving Factors  Medication, Ice    Effect of Pain on Daily Activities  Unable to perform ADLs like placing laundry on the line, lifting things into the cabinet     Multiple Pain Sites  No         [facility] PT Assessment - 11/11/17 0855      Assessment   Medical Diagnosis  R Rotator Cuff Repiar    Referring Provider  Tania Ade, MD    Hand Dominance  Right    Next MD Visit  11/19/2017    Prior Therapy  Yes      Balance Screen   Has the patient fallen in the past 6 months  No    Has the patient had a decrease in activity level because of a fear of falling?   No    Is the patient reluctant to leave their home because of a fear of falling?   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    Leisure  Playing with the grandkids, roller skating      Observation/Other Assessments   Focus on Therapeutic Outcomes (FOTO)   Intake - status 43% (limitation 57%); predicted - 64% (limitation 36%)      Posture/Postural  Control   Posture/Postural Control  Postural limitations    Postural Limitations  Rounded Shoulders    Posture Comments  R > L Rounded Shoulders      AROM   Overall AROM Comments  L shoulder WNL    AROM Assessment Site  Shoulder    Right/Left Shoulder  Right;Left    Right Shoulder Flexion  85 Degrees    Right Shoulder ABduction  60 Degrees    Right Shoulder Internal Rotation  46 Degrees    Right Shoulder External Rotation  32 Degrees      PROM   Right/Left Shoulder  Right    Right Shoulder Flexion  106 Degrees      Strength   Overall Strength Comments  Not tested today secondary to pain and limited ROM      Palpation   Palpation comment  TTP over superior angle of scapula, infraspinatus,                 Objective measurements completed on examination: See above findings.      [facility] Adult PT Treatment/Exercise - 11/11/17 0855      Shoulder Exercises: Supine   External Rotation  15 reps;AAROM    Flexion  15 reps;AAROM      Shoulder Exercises: Standing   Internal Rotation  15 reps;AAROM;Right    ABduction  15 reps;AAROM;Right             PT Education - 11/11/17 1035    Education Details  Pt educated on results of examination, intial HEP, and POC    Person(s) Educated  Patient    Methods  Explanation;Demonstration    Comprehension  Verbalized understanding;Returned demonstration       PT Short Term Goals - 11/11/17 1051      PT SHORT TERM GOAL #1   Title  Pt to be independent with initial HEP    Status  New    Target Date  12/02/17      PT SHORT TERM GOAL #2   Title  Pt to demonstrate 25 degree  improvement in all directions of R shoulder AROM     Target Date  12/02/17        PT Long Term Goals - 11/11/17 1053      PT LONG TERM GOAL #1   Title  Pt to be independent with advaned HEP    Status  New    Target Date  12/23/17      PT LONG TERM GOAL #2   Title  Pt will improve R shoulder ROM to WNL in all directions with no increase in pain     Status  New    Target Date  12/23/17      PT LONG TERM GOAL #3   Title  Pt to demonstrate ability to lift 5 lbs onto top shelf of cabinet with no increase in pain    Status  New    Target Date  12/23/17      PT LONG TERM GOAL #4   Title  Pt to report ability to perform ADLs and household tasks without pain limiting function    Target Date  12/23/17      PT LONG TERM GOAL #5   Title  Pt to reporrt ability to return to work at full capacity with no limitations or increase in pain     Status  New    Target Date  12/23/17  Plan - 11/11/17 1002    Clinical Impression Statement  Pt is a 57 y/o F who presents to OP PT s/p R rotator cuff debridement and R distal clavical excision. Pt presents with decreased ROM into all directions secondary to pain and weakness. These impairments are limiting pt's ability to perform ADLs without pain including hanging laundry on a line to dry and lifting objects overhead into a cabinet. At this time pt is doing light duty at work but would like to return to full duty including lifting and positioning patients. 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     Clinical Presentation  Stable    Clinical Decision Making  Low    Rehab Potential  Fair    Clinical Impairments Affecting Rehab Potential  Pt has experienced symptoms in the R shoulder for > 1 year and has 25% partial tear in supraspinatus    PT Frequency  2x / week    PT Duration  6 weeks    PT  Treatment/Interventions  ADLs/Self Care Home Management;Cryotherapy;Electrical Stimulation;Iontophoresis 4mg /ml Dexamethasone;Moist Heat;Ultrasound;Functional mobility training;Therapeutic activities;Therapeutic exercise;Neuromuscular re-education;Patient/family education;Manual techniques;Scar mobilization;Passive range of motion;Dry needling;Taping    Consulted and Agree with Plan of Care  Patient       Patient will benefit from skilled therapeutic intervention in order to improve the following deficits and impairments:  Hypomobility, Decreased scar mobility, Decreased activity tolerance, Decreased strength, Impaired UE functional use, Pain, Increased muscle spasms, Decreased range of motion, Improper body mechanics, Postural dysfunction, Impaired flexibility  Visit Diagnosis: Chronic right shoulder pain  Muscle weakness (generalized)  Stiffness of right shoulder, not elsewhere classified  Other symptoms and signs involving the musculoskeletal system     Problem List Patient Active Problem List   Diagnosis Date Noted  . Elevated blood pressure reading 09/06/2017  . Right rotator cuff tear 08/20/2017  . Cervical disc disorder with radiculopathy of cervical region 04/25/2016  . Nonallopathic lesion of cervical region 04/17/2016  . Nonallopathic lesion of thoracic region 04/17/2016  . Nonallopathic lesion of rib cage 04/17/2016  . Nonallopathic lesion of lumbosacral region 04/17/2016  . Degenerative cervical disc 03/29/2016  . Degenerative arthritis of right knee 03/29/2016  . 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 Adjustment disorder with mixed anxiety and depressed mood 07/14/2014  . Hx of colonic polyps   . 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 Chronic constipation 03/05/2013  . Coronary atherosclerosis of native coronary artery 08/27/2011  . History of uterine cancer 08/15/2011  . Hypercholesterolemia 08/15/2011  . History of colonic polyps 12/29/2009     Shirline Frees, SPT 11/11/2017, 12:07 PM  [facility] [address]  [place], [place], [address] Phone: [phone]   Fax:  [phone]  Name: Sandra [name] MRN: [id number] Date of Birth: 1961/03/26

## 2017-11-13 ENCOUNTER — Encounter: Payer: Self-pay | Admitting: Physical Therapy

## 2017-11-13 ENCOUNTER — Ambulatory Visit: Payer: 59 | Admitting: Physical Therapy

## 2017-11-13 DIAGNOSIS — M6281 Muscle weakness (generalized): Secondary | ICD-10-CM | POA: Diagnosis not present

## 2017-11-13 DIAGNOSIS — G8929 Other chronic pain: Secondary | ICD-10-CM

## 2017-11-13 DIAGNOSIS — M25511 Pain in right shoulder: Secondary | ICD-10-CM | POA: Diagnosis not present

## 2017-11-13 DIAGNOSIS — M25611 Stiffness of right shoulder, not elsewhere classified: Secondary | ICD-10-CM

## 2017-11-13 DIAGNOSIS — R29898 Other symptoms and signs involving the musculoskeletal system: Secondary | ICD-10-CM | POA: Diagnosis not present

## 2017-11-13 NOTE — Therapy (Signed)
[facility] [address]  [place], [place], [address] Phone: [phone]   Fax:  [phone]  Physical Therapy Treatment  Patient Details  Name: Sandra [name] MRN: [id number] Date of Birth: April 23, 1961 Referring Provider: Tania Ade, MD   Encounter Date: 11/13/2017  PT End of Session - 11/13/17 0849    Visit Number  2    Number of Visits  12    Date for PT Re-Evaluation  12/23/17    Authorization Type  [facility]    PT Start Time  0849    PT Stop Time  0940    PT Time Calculation (min)  51 min    Activity Tolerance  Patient tolerated treatment well    Behavior During Therapy  [facility] for tasks assessed/performed       Past Medical History:  Diagnosis Date  . Anxiety   . Arthritis   . CAD (coronary artery disease)    nonobstructive CAD in LAD per 2013 CTA coronary morph  . Cancer [facility]) 2006   uterine  . Complication of anesthesia PT STATES HAS LETTER FROM [place] [facility]. STATING THAT SHE HAS A VERY SMALL AIRWAY (LAST SURG. HYSTERECTOMY AT [facility] IN 2006   PT STATES LYNN BEASON CRNA WHOM WORKS HERE TOLD SHE WOULD BE OKAY TO BE DONE AT [facility]  . Depression   . Diabetes mellitus    no meds taken now, checks cbg once or twice a week   . 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 GERD (gastroesophageal reflux disease)   . Heart murmur    with last visit, MD states "outgrown" murmur  . Hemorrhoids, internal, with bleeding and Grade 2 prolapse 03/05/2013   All positions seen an anoscopy RP columns banded 03/05/2013 LL and RA banded 03/18/2013     . History of endometrial cancer 2006 ---- S/P ABD. HYSTERECTOMY   STAGE I ENDOMETROID CARCINOMA  . Hx of adenomatous colonic polyps 2011   BENIGN  . Hyperlipidemia    no meds now  . Hypertension   . Morbid obesity, weight - 203, BMI - 38.4 05/12/2013  . OSA on CPAP    cpap setting of 16  . Sleep apnea    no CPAP since surgery    Past Surgical History:   Procedure Laterality Date  . ABDOMINAL HYSTERECTOMY  12-20-2004   TAH  . BILATERAL BREAST REDUCTION  11-16-1999  . BREATH TEK H PYLORI N/A 05/27/2013   Procedure: BREATH TEK H PYLORI;  Surgeon: Shann Medal, MD;  Location: [facility];  Service: General;  Laterality: N/A;  . [place]  . COLONOSCOPY    . COLONOSCOPY N/A 04/25/2014   Procedure: COLONOSCOPY;  Surgeon: Gatha Mayer, MD;  [facility];  Service: Endoscopy;  Laterality: N/A;  . CYSTOSCOPY  09/06/2011   Procedure: CYSTOSCOPY;  Surgeon: Ailene Rud, MD;  Location: [facility];  Service: Urology;  Laterality: N/A;  LYNX SLING   . ESOPHAGOGASTRODUODENOSCOPY (EGD) WITH PROPOFOL N/A 09/27/2014   Procedure: ESOPHAGOGASTRODUODENOSCOPY (EGD) WITH PROPOFOL;  Surgeon: Gatha Mayer, MD;  [facility];  Service: Endoscopy;  Laterality: N/A;  . ESOPHAGOGASTRODUODENOSCOPY (EGD) WITH PROPOFOL N/A 03/11/2016   Procedure: ESOPHAGOGASTRODUODENOSCOPY (EGD) WITH PROPOFOL;  Surgeon: Jerene Bears, MD;  [facility];  Service: Endoscopy;  Laterality: N/A;  . GASTRIC BYPASS  10/04/2013  . GASTRIC ROUX-EN-Y N/A 10/04/2013   Procedure: LAPAROSCOPIC ROUX-EN-Y GASTRIC BYPASS WITH UPPER ENDOSCOPY;  Surgeon: Shann Medal, MD;  [facility];  Service: General;  Laterality: N/A;  . HEMORRHOID BANDING  02/2013  . PUBOVAGINAL SLING  09/06/2011   Procedure: Gaynelle Arabian;  Surgeon: Ailene Rud, MD;  Location: [facility];  Service: Urology;  Laterality: N/A;  . SHOULDER ARTHROSCOPY Right 10/23/2017   Procedure: RIGHT SHOULDER ARTHROSCOPIC ROTATOR CUFF REPAIR VS. DEBRIDEMENT, SUBACROMIAL DECOMPARESSION, DISTAL CLAVICAL EXCISION;  Surgeon: Tania Ade, MD;  Location: [facility];  Service: Orthopedics;  Laterality: Right;  . UVULOPALATOPHARYNGOPLASTY, TONSILLECTOMY AND SEPTOPLASTY  1994    There were no vitals filed for this visit.  Subjective Assessment -  11/13/17 0851    Subjective  Pt reports trying to wean self off oxycodone - took Tylenol Arthritis last night and feels like it did ok managing pain.    Patient Stated Goals  Get back to normal job activities, normal household activities    Currently in Pain?  Yes    Pain Score  3     Pain Location  Shoulder    Pain Orientation  Right    Pain Descriptors / Indicators  Throbbing    Pain Type  Chronic pain                       [facility] Adult PT Treatment/Exercise - 11/13/17 0849      Exercises   Exercises  Shoulder      Shoulder Exercises: Supine   Protraction  Right;10 reps;Weights;Strengthening    Protraction Weight (lbs)  2      Shoulder Exercises: Standing   External Rotation  Both;10 reps 3" hold    External Rotation Limitations  standing against pool noodle on wall for tactile cueing for scap retraction    Extension  Both;10 reps;Theraband;Strengthening    Theraband Level (Shoulder Extension)  Level 1 (Yellow)    Extension Limitations  cues for scap retraction    Row  Both;10 reps;Theraband;Strengthening    Theraband Level (Shoulder Row)  Level 1 (Yellow)    Retraction  Both;10 reps 3" hold    Retraction Limitations  standing against pool noodle on wall for tactile cueing      Shoulder Exercises: Pulleys   Flexion  3 minutes    Scaption  3 minutes      Shoulder Exercises: Therapy Ball   Flexion  Both;10 reps    Flexion Limitations  orange Pball on wall      Shoulder Exercises: Isometric Strengthening   External Rotation Limitations  Isometric step-out with yellow TB x10    Internal Rotation Limitations  Isometric step-out with yellow TB x10      Modalities   Modalities  Vasopneumatic      Vasopneumatic   Number Minutes Vasopneumatic   10 minutes    Vasopnuematic Location   Shoulder    Vasopneumatic Pressure  Low    Vasopneumatic Temperature   coldest - 36 dg      Manual Therapy   Manual Therapy  Joint mobilization;Soft tissue  mobilization;Myofascial release;Passive ROM    Manual therapy comments  supine    Joint Mobilization  R shoulder grade II-III inferior glide and A/P    Soft tissue mobilization  R pecs and posterior shoulder complex    Myofascial Release  manual TPR R pec major & teres group    Passive ROM  MWM with R shoulder flexion & IR/ER               PT Short  Term Goals - 11/13/17 0854      PT SHORT TERM GOAL #1   Title  Pt to be independent with initial HEP    Status  On-going      PT SHORT TERM GOAL #2   Title  Pt to demonstrate 25 degree improvement in all directions of R shoulder AROM     Status  On-going        PT Long Term Goals - 11/13/17 0855      PT LONG TERM GOAL #1   Title  Pt to be independent with advaned HEP    Status  On-going      PT LONG TERM GOAL #2   Title  Pt will improve R shoulder ROM to WNL in all directions with no increase in pain     Status  On-going      PT LONG TERM GOAL #3   Title  Pt to demonstrate ability to lift 5 lbs onto top shelf of cabinet with no increase in pain    Status  On-going      PT LONG TERM GOAL #4   Title  Pt to report ability to perform ADLs and household tasks without pain limiting function    Status  On-going      PT LONG TERM GOAL #5   Title  Pt to reporrt ability to return to work at full capacity with no limitations or increase in pain     Status  On-going      PT LONG TERM GOAL #6   Title  Pt will demonstrate equal to or > 4/5 R UE strength to improve ability to perform work-related activities and ADLs independently.     Status  On-going            Plan - 11/13/17 0854    Clinical Impression Statement  Sandra [name] reporting no issues or concerns with wand AAROM HEP and denies need for review due to familiarity from prior PT episode. Muscle tightness with ttp present over lateral pecs and inferior posterior shoulder complex which responded to some degree to manual therapy but may benefit from DN if tension/ttp  persists. Reviewed technique for self-STM with tennis/small ball on wall for home. Pt reporting poor tolerance for RTC strengthening HEP exercises with red TB provided by MD due to pain, therefore reduced resistance to yellow TB and shifted strengthening focus to isometric step-outs with pt reporting better tolerance/decreased pain.    Rehab Potential  Fair    Clinical Impairments Affecting Rehab Potential  Pt has experienced symptoms in the R shoulder for > 1 year and has 25% partial tear in supraspinatus    PT Treatment/Interventions  ADLs/Self Care Home Management;Cryotherapy;Electrical Stimulation;Iontophoresis 4mg /ml Dexamethasone;Moist Heat;Ultrasound;Functional mobility training;Therapeutic activities;Therapeutic exercise;Neuromuscular re-education;Patient/family education;Manual techniques;Scar mobilization;Passive range of motion;Dry needling;Taping    Consulted and Agree with Plan of Care  Patient       Patient will benefit from skilled therapeutic intervention in order to improve the following deficits and impairments:  Hypomobility, Decreased scar mobility, Decreased activity tolerance, Decreased strength, Impaired UE functional use, Pain, Increased muscle spasms, Decreased range of motion, Improper body mechanics, Postural dysfunction, Impaired flexibility  Visit Diagnosis: Chronic right shoulder pain  Muscle weakness (generalized)  Stiffness of right shoulder, not elsewhere classified  Other symptoms and signs involving the musculoskeletal system     Problem List Patient Active Problem List   Diagnosis Date Noted  . Elevated blood pressure reading 09/06/2017  . Right rotator cuff tear 08/20/2017  .  Cervical disc disorder with radiculopathy of cervical region 04/25/2016  . Nonallopathic lesion of cervical region 04/17/2016  . Nonallopathic lesion of thoracic region 04/17/2016  . Nonallopathic lesion of rib cage 04/17/2016  . Nonallopathic lesion of lumbosacral region  04/17/2016  . Degenerative cervical disc 03/29/2016  . Degenerative arthritis of right knee 03/29/2016  . 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 Adjustment disorder with mixed anxiety and depressed mood 07/14/2014  . Hx of colonic polyps   . 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 Chronic constipation 03/05/2013  . Coronary atherosclerosis of native coronary artery 08/27/2011  . History of uterine cancer 08/15/2011  . Hypercholesterolemia 08/15/2011  . History of colonic polyps 12/29/2009    Percival Spanish, PT, MPT 11/13/2017, 11:55 AM  [facility] [address]  [place], [place], [address] Phone: [phone]   Fax:  [phone]  Name: Sandra [name] MRN: [id number] Date of Birth: Oct 21, 1960

## 2017-11-18 ENCOUNTER — Ambulatory Visit: Payer: 59 | Admitting: Physical Therapy

## 2017-11-18 ENCOUNTER — Encounter: Payer: Self-pay | Admitting: Physical Therapy

## 2017-11-18 DIAGNOSIS — M6281 Muscle weakness (generalized): Secondary | ICD-10-CM | POA: Diagnosis not present

## 2017-11-18 DIAGNOSIS — G8929 Other chronic pain: Secondary | ICD-10-CM

## 2017-11-18 DIAGNOSIS — M25611 Stiffness of right shoulder, not elsewhere classified: Secondary | ICD-10-CM

## 2017-11-18 DIAGNOSIS — M25511 Pain in right shoulder: Secondary | ICD-10-CM | POA: Diagnosis not present

## 2017-11-18 DIAGNOSIS — R29898 Other symptoms and signs involving the musculoskeletal system: Secondary | ICD-10-CM | POA: Diagnosis not present

## 2017-11-18 MED ORDER — BUPROPION HCL ER (XL) 150 MG PO TB24: 300.0000 mg | ORAL_TABLET | Freq: Every day | ORAL | 5 refills | Status: DC

## 2017-11-18 MED ORDER — ESCITALOPRAM OXALATE 10 MG PO TABS: 20.0000 mg | ORAL_TABLET | Freq: Every day | ORAL | 5 refills | Status: DC

## 2017-11-18 NOTE — Therapy (Addendum)
[facility] [address]  [place], [place], [address] Phone: [phone]   Fax:  [phone]  Physical Therapy Treatment  Patient Details  Name: Sandra [name] MRN: [id number] Date of Birth: June 09, 1960 Referring Provider: Tania Ade, MD   Encounter Date: 11/18/2017  PT End of Session - 11/18/17 1017    Visit Number  3    Number of Visits  12    Date for PT Re-Evaluation  12/23/17    Authorization Type  [facility]    PT Start Time  0931    PT Stop Time  1014    PT Time Calculation (min)  43 min    Activity Tolerance  Patient tolerated treatment well    Behavior During Therapy  [facility] for tasks assessed/performed       Past Medical History:  Diagnosis Date  . Anxiety   . Arthritis   . CAD (coronary artery disease)    nonobstructive CAD in LAD per 2013 CTA coronary morph  . Cancer [facility]) 2006   uterine  . Complication of anesthesia PT STATES HAS LETTER FROM [place] [facility]. STATING THAT SHE HAS A VERY SMALL AIRWAY (LAST SURG. HYSTERECTOMY AT [facility] IN 2006   PT STATES LYNN BEASON CRNA WHOM WORKS HERE TOLD SHE WOULD BE OKAY TO BE DONE AT [facility]  . Depression   . Diabetes mellitus    no meds taken now, checks cbg once or twice a week   . 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 GERD (gastroesophageal reflux disease)   . Heart murmur    with last visit, MD states "outgrown" murmur  . Hemorrhoids, internal, with bleeding and Grade 2 prolapse 03/05/2013   All positions seen an anoscopy RP columns banded 03/05/2013 LL and RA banded 03/18/2013     . History of endometrial cancer 2006 ---- S/P ABD. HYSTERECTOMY   STAGE I ENDOMETROID CARCINOMA  . Hx of adenomatous colonic polyps 2011   BENIGN  . Hyperlipidemia    no meds now  . Hypertension   . Morbid obesity, weight - 203, BMI - 38.4 05/12/2013  . OSA on CPAP    cpap setting of 16  . Sleep apnea    no CPAP since surgery    Past Surgical History:   Procedure Laterality Date  . ABDOMINAL HYSTERECTOMY  12-20-2004   TAH  . BILATERAL BREAST REDUCTION  11-16-1999  . BREATH TEK H PYLORI N/A 05/27/2013   Procedure: BREATH TEK H PYLORI;  Surgeon: Shann Medal, MD;  Location: [facility];  Service: General;  Laterality: N/A;  . [place]  . COLONOSCOPY    . COLONOSCOPY N/A 04/25/2014   Procedure: COLONOSCOPY;  Surgeon: Gatha Mayer, MD;  [facility]: [facility];  Service: Endoscopy;  Laterality: N/A;  . CYSTOSCOPY  09/06/2011   Procedure: CYSTOSCOPY;  Surgeon: Ailene Rud, MD;  Location[facility];  Service: Urology;  Laterality: N/A;  LYNX SLING   . ESOPHAGOGASTRODUODENOSCOPY (EGD) WITH PROPOFOL N/A 09/27/2014   Procedure: ESOPHAGOGASTRODUODENOSCOPY (EGD) WITH PROPOFOL;  Surgeon: Gatha Mayer, MD;  [facility];  Service: Endoscopy;  Laterality: N/A;  . ESOPHAGOGASTRODUODENOSCOPY (EGD) WITH PROPOFOL N/A 03/11/2016   Procedure: ESOPHAGOGASTRODUODENOSCOPY (EGD) WITH PROPOFOL;  Surgeon: Jerene Bears, MD;  [facility];  Service: Endoscopy;  Laterality: N/A;  . GASTRIC BYPASS  10/04/2013  . GASTRIC ROUX-EN-Y N/A 10/04/2013   Procedure: LAPAROSCOPIC ROUX-EN-Y GASTRIC BYPASS WITH UPPER ENDOSCOPY;  Surgeon: Shann Medal, MD;  [facility];  Service: General;  Laterality: N/A;  . HEMORRHOID BANDING  02/2013  . PUBOVAGINAL SLING  09/06/2011   Procedure: Gaynelle Arabian;  Surgeon: Ailene Rud, MD;  Location: [facility];  Service: Urology;  Laterality: N/A;  . SHOULDER ARTHROSCOPY Right 10/23/2017   Procedure: RIGHT SHOULDER ARTHROSCOPIC ROTATOR CUFF REPAIR VS. DEBRIDEMENT, SUBACROMIAL DECOMPARESSION, DISTAL CLAVICAL EXCISION;  Surgeon: Tania Ade, MD;  Location: [place] [facility];  Service: Orthopedics;  Laterality: Right;  . UVULOPALATOPHARYNGOPLASTY, TONSILLECTOMY AND SEPTOPLASTY  1994    There were no vitals filed for this visit.  Subjective Assessment -  11/18/17 0934    Subjective  Pt reports that she has not had any oxycodone in at least a week or more. Has been managing pain with OTC medication and ice    Limitations  Lifting;House hold activities    Patient Stated Goals  Get back to normal job activities, normal household activities    Currently in Pain?  Yes    Pain Score  2     Pain Location  Shoulder    Pain Orientation  Right    Pain Descriptors / Indicators  Aching;Throbbing    Pain Type  Chronic pain    Pain Relieving Factors  Medication, Ice                       [facility] Adult PT Treatment/Exercise - 11/18/17 0001      Shoulder Exercises: Supine   Protraction  Right;15 reps;Strengthening;Weights    Protraction Weight (lbs)  500 g medicine ball at 90 degrees of flexion    Other Supine Exercises  Holding 500 g Green medicine ball at 90 degrees of flexion with R UE; 2 reps; 1st rep capital letters, 2nd rep lowercase letters      Shoulder Exercises: Prone   Extension  15 reps;Both;Strengthening prone over Green PB    External Rotation  15 reps;Both;Strengthening prone over green PB    Other Prone Exercises  Scaption with thumbs up; prone over Green TB; 15 reps      Shoulder Exercises: Standing   Other Standing Exercises  Wall clocks with Yellow TB; 10 reps to 3 positions with each UE. VC for slow, controlled movement.       Shoulder Exercises: ROM/Strengthening   UBE (Upper Arm Bike)  L2 x 6 min (3 min forward/3 min backward)    Other ROM/Strengthening Exercises  90 degress of R UE abduction with R hand on ball against wall. PT provided perterbations for rhythmic stabilization 2 x 1 min. Pt has most difficulty with maintaining position when pushed into horizontal adduction      Shoulder Exercises: Stretch   Wall Stretch - Flexion  5 reps AAROM at wall ladder    Wall Stretch - ABduction  5 reps AAROM at wall ladder    Other Shoulder Stretches  Doorway pec stretch; 3 x 30 sec middle position, 3 x 30 sec upper  position             PT Education - 11/18/17 1123    Education Details  New HEP exercises given today    Person(s) Educated  Patient    Methods  Explanation;Demonstration    Comprehension  Verbalized understanding;Returned demonstration       PT Short Term Goals - 11/13/17 0854      PT SHORT TERM GOAL #1   Title  Pt to be independent with initial HEP  Status  On-going      PT SHORT TERM GOAL #2   Title  Pt to demonstrate 25 degree improvement in all directions of R shoulder AROM     Status  On-going        PT Long Term Goals - 11/13/17 0855      PT LONG TERM GOAL #1   Title  Pt to be independent with advaned HEP    Status  On-going      PT LONG TERM GOAL #2   Title  Pt will improve R shoulder ROM to WNL in all directions with no increase in pain     Status  On-going      PT LONG TERM GOAL #3   Title  Pt to demonstrate ability to lift 5 lbs onto top shelf of cabinet with no increase in pain    Status  On-going      PT LONG TERM GOAL #4   Title  Pt to report ability to perform ADLs and household tasks without pain limiting function    Status  On-going      PT LONG TERM GOAL #5   Title  Pt to reporrt ability to return to work at full capacity with no limitations or increase in pain     Status  On-going      PT LONG TERM GOAL #6   Title  Pt will demonstrate equal to or > 4/5 R UE strength to improve ability to perform work-related activities and ADLs independently.     Status  On-going            Plan - 11/18/17 1118    Clinical Impression Statement  Yuki tolerated treatment session well today with increased muscular fatigue noted toward end of session. Pt expressed very good stretch with pec stretch and therefore given as HEP. Pt continues to have limitations in ROM, especially into flexion and abduction. Pt will benefit from further physical therapy to address ROM limitations, address strength deficits, and improve ability to perform ADLs  independently.     Clinical Impairments Affecting Rehab Potential  Pt has experienced symptoms in the R shoulder for > 1 year and has 25% partial tear in supraspinatus    PT Treatment/Interventions  ADLs/Self Care Home Management;Cryotherapy;Electrical Stimulation;Iontophoresis 4mg /ml Dexamethasone;Moist Heat;Ultrasound;Functional mobility training;Therapeutic activities;Therapeutic exercise;Neuromuscular re-education;Patient/family education;Manual techniques;Scar mobilization;Passive range of motion;Dry needling;Taping    Consulted and Agree with Plan of Care  Patient       Patient will benefit from skilled therapeutic intervention in order to improve the following deficits and impairments:  Hypomobility, Decreased scar mobility, Decreased activity tolerance, Decreased strength, Impaired UE functional use, Pain, Increased muscle spasms, Decreased range of motion, Improper body mechanics, Postural dysfunction, Impaired flexibility  Visit Diagnosis: Chronic right shoulder pain  Muscle weakness (generalized)  Stiffness of right shoulder, not elsewhere classified     Problem List Patient Active Problem List   Diagnosis Date Noted  . Elevated blood pressure reading 09/06/2017  . Right rotator cuff tear 08/20/2017  . Cervical disc disorder with radiculopathy of cervical region 04/25/2016  . Nonallopathic lesion of cervical region 04/17/2016  . Nonallopathic lesion of thoracic region 04/17/2016  . Nonallopathic lesion of rib cage 04/17/2016  . Nonallopathic lesion of lumbosacral region 04/17/2016  . Degenerative cervical disc 03/29/2016  . Degenerative arthritis of right knee 03/29/2016  . 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 Adjustment disorder with mixed anxiety and depressed mood 07/14/2014  . Hx of colonic polyps   .  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 Chronic constipation 03/05/2013  . Coronary atherosclerosis of native coronary artery 08/27/2011  . History  of uterine cancer 08/15/2011  . Hypercholesterolemia 08/15/2011  . History of colonic polyps 12/29/2009    Shirline Frees, SPT 11/18/2017, 11:49 AM  [facility] [address]  [place], [place], [address] Phone: [phone]   Fax:  [phone]  Name: Vienna Folden MRN: [id number] Date of Birth: 1961/04/17

## 2017-11-18 NOTE — Addendum Note (Signed)
Addended by: [name] on: 11/18/2017 08:00 AM   Modules accepted: Orders

## 2017-11-19 DIAGNOSIS — Z9889 Other specified postprocedural states: Secondary | ICD-10-CM | POA: Diagnosis not present

## 2017-11-19 MED FILL — buPROPion HCL ER (XL) 150 M: 150 | 30 days supply | Qty: 30 | Fill #2

## 2017-11-19 MED FILL — ESCITALOPRAM 10 MG TABLET: 10 | 30 days supply | Qty: 30 | Fill #2

## 2017-11-20 ENCOUNTER — Encounter: Payer: Self-pay | Admitting: Physical Therapy

## 2017-11-20 ENCOUNTER — Ambulatory Visit: Payer: 59 | Admitting: Physical Therapy

## 2017-11-20 DIAGNOSIS — M25611 Stiffness of right shoulder, not elsewhere classified: Secondary | ICD-10-CM | POA: Diagnosis not present

## 2017-11-20 DIAGNOSIS — M6281 Muscle weakness (generalized): Secondary | ICD-10-CM | POA: Diagnosis not present

## 2017-11-20 DIAGNOSIS — M25511 Pain in right shoulder: Principal | ICD-10-CM

## 2017-11-20 DIAGNOSIS — G8929 Other chronic pain: Secondary | ICD-10-CM | POA: Diagnosis not present

## 2017-11-20 DIAGNOSIS — R29898 Other symptoms and signs involving the musculoskeletal system: Secondary | ICD-10-CM | POA: Diagnosis not present

## 2017-11-20 NOTE — Therapy (Addendum)
[facility] [address]  [place], [place], [address] Phone: [phone]   Fax:  [phone]  Physical Therapy Treatment  Patient Details  Name: Sandra [name] MRN: [id number] Date of Birth: 11-Feb-1961 Referring Provider: Tania Ade, MD   Encounter Date: 11/20/2017  PT End of Session - 11/20/17 1101    Visit Number  4    Number of Visits  12    Date for PT Re-Evaluation  12/23/17    Authorization Type  [facility]    PT Start Time  1023    PT Stop Time  1101    PT Time Calculation (min)  38 min    Activity Tolerance  Patient tolerated treatment well    Behavior During Therapy  [facility] for tasks assessed/performed       Past Medical History:  Diagnosis Date  . Anxiety   . Arthritis   . CAD (coronary artery disease)    nonobstructive CAD in LAD per 2013 CTA coronary morph  . Cancer [facility]) 2006   uterine  . Complication of anesthesia PT STATES HAS LETTER FROM [place] [facility]. STATING THAT SHE HAS A VERY SMALL AIRWAY (LAST SURG. HYSTERECTOMY AT [facility] IN 2006   PT STATES LYNN BEASON CRNA WHOM WORKS HERE TOLD SHE WOULD BE OKAY TO BE DONE AT [facility]  . Depression   . Diabetes mellitus    no meds taken now, checks cbg once or twice a week   . 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 GERD (gastroesophageal reflux disease)   . Heart murmur    with last visit, MD states "outgrown" murmur  . Hemorrhoids, internal, with bleeding and Grade 2 prolapse 03/05/2013   All positions seen an anoscopy RP columns banded 03/05/2013 LL and RA banded 03/18/2013     . History of endometrial cancer 2006 ---- S/P ABD. HYSTERECTOMY   STAGE I ENDOMETROID CARCINOMA  . Hx of adenomatous colonic polyps 2011   BENIGN  . Hyperlipidemia    no meds now  . Hypertension   . Morbid obesity, weight - 203, BMI - 38.4 05/12/2013  . OSA on CPAP    cpap setting of 16  . Sleep apnea    no CPAP since surgery    Past Surgical History:   Procedure Laterality Date  . ABDOMINAL HYSTERECTOMY  12-20-2004   TAH  . BILATERAL BREAST REDUCTION  11-16-1999  . BREATH TEK H PYLORI N/A 05/27/2013   Procedure: BREATH TEK H PYLORI;  Surgeon: Shann Medal, MD;  [facility];  Service: General;  Laterality: N/A;  . [place]  . COLONOSCOPY    . COLONOSCOPY N/A 04/25/2014   Procedure: COLONOSCOPY;  Surgeon: Gatha Mayer, MD;  [facility];  Service: Endoscopy;  Laterality: N/A;  . CYSTOSCOPY  09/06/2011   Procedure: CYSTOSCOPY;  Surgeon: Ailene Rud, MD;  Location: [facility];  Service: Urology;  Laterality: N/A;  LYNX SLING   . ESOPHAGOGASTRODUODENOSCOPY (EGD) WITH PROPOFOL N/A 09/27/2014   Procedure: ESOPHAGOGASTRODUODENOSCOPY (EGD) WITH PROPOFOL;  Surgeon: Gatha Mayer, MD;  [facility];  Service: Endoscopy;  Laterality: N/A;  . ESOPHAGOGASTRODUODENOSCOPY (EGD) WITH PROPOFOL N/A 03/11/2016   Procedure: ESOPHAGOGASTRODUODENOSCOPY (EGD) WITH PROPOFOL;  Surgeon: Jerene Bears, MD;  [facility];  Service: Endoscopy;  Laterality: N/A;  . GASTRIC BYPASS  10/04/2013  . GASTRIC ROUX-EN-Y N/A 10/04/2013   Procedure: LAPAROSCOPIC ROUX-EN-Y GASTRIC BYPASS WITH UPPER ENDOSCOPY;  Surgeon: Shann Medal, MD;  [facility];  Service: General;  Laterality: N/A;  . HEMORRHOID BANDING  02/2013  . PUBOVAGINAL SLING  09/06/2011   Procedure: Gaynelle Arabian;  Surgeon: Ailene Rud, MD;  Location: [facility];  Service: Urology;  Laterality: N/A;  . SHOULDER ARTHROSCOPY Right 10/23/2017   Procedure: RIGHT SHOULDER ARTHROSCOPIC ROTATOR CUFF REPAIR VS. DEBRIDEMENT, SUBACROMIAL DECOMPARESSION, DISTAL CLAVICAL EXCISION;  Surgeon: Tania Ade, MD;  Location: [place];  Service: Orthopedics;  Laterality: Right;  . UVULOPALATOPHARYNGOPLASTY, TONSILLECTOMY AND SEPTOPLASTY  1994    There were no vitals filed for this visit.  Subjective Assessment -  11/20/17 1025    Subjective  Pt reports that she is well today, encoutered construction and traffic on commute and therefore was a few minutes late. Pt visited MD yesterday and was cleared to do more housework activities including sweeping and carrying the laundry basket. Will return to work on 8/8.      Limitations  Lifting;House hold activities    Patient Stated Goals  Get back to normal job activities, normal household activities         [facility] PT Assessment - 11/20/17 1023      Assessment   Medical Diagnosis  R rotator cuff and labrum debridement     Referring Provider  Tania Ade, MD    Next MD Visit  12/24/17                   [facility] Adult PT Treatment/Exercise - 11/20/17 0001      Self-Care   Self-Care  ADL's    ADL's  Pt provided with education on how to reduce strain/load on R shoulder when performing activities around the house including sweeping/lifting laundry basket.       Shoulder Exercises: Prone   Other Prone Exercises  In quadraped, rocking forward onto the hands and then to the R. VC for adequate challenge. 15 reps.      Shoulder Exercises: Sidelying   External Rotation  Right;15 reps;Strengthening;Weights    External Rotation Weight (lbs)  2#    ABduction  10 reps;Right;Weights    ABduction Weight (lbs)  2#      Manual Therapy   Manual Therapy  Soft tissue mobilization;Myofascial release    Manual therapy comments  Supine; Pec, biceps, and deltoid musculature. Trigger points noted in R middle deltoid and biceps musculature    Soft tissue mobilization  R pecs and anterior shoulder complex    Myofascial Release  Attempted manual TPR to delt and biceps, no twitch reponse noted       Trigger Point Dry Needling - 11/20/17 1023    Consent Given?  Yes    Education Handout Provided  Yes    Muscles Treated Upper Body  -- R ant deltoid, biceps (both heads), brachialis: + twtich           PT Education - 11/20/17 1201    Education Details   Information about trigger point dry needling    Person(s) Educated  Patient    Methods  Handout    Comprehension  Verbalized understanding       PT Short Term Goals - 11/13/17 0854      PT SHORT TERM GOAL #1   Title  Pt to be independent with initial HEP    Status  On-going      PT SHORT TERM GOAL #2   Title  Pt to demonstrate 25 degree improvement in all directions of  R shoulder AROM     Status  On-going        PT Long Term Goals - 11/13/17 0855      PT LONG TERM GOAL #1   Title  Pt to be independent with advaned HEP    Status  On-going      PT LONG TERM GOAL #2   Title  Pt will improve R shoulder ROM to WNL in all directions with no increase in pain     Status  On-going      PT LONG TERM GOAL #3   Title  Pt to demonstrate ability to lift 5 lbs onto top shelf of cabinet with no increase in pain    Status  On-going      PT LONG TERM GOAL #4   Title  Pt to report ability to perform ADLs and household tasks without pain limiting function    Status  On-going      PT LONG TERM GOAL #5   Title  Pt to reporrt ability to return to work at full capacity with no limitations or increase in pain     Status  On-going      PT LONG TERM GOAL #6   Title  Pt will demonstrate equal to or > 4/5 R UE strength to improve ability to perform work-related activities and ADLs independently.     Status  On-going            Plan - 11/20/17 1216    Clinical Impression Statement  Session today emphasized strengthening of global shoulder musculature and TRP of biceps brachii, brachialis, and deltoid musculature. Twitch response elicited during dry needling with palpable increased length in muscle. Pt expressed relief of "tight" feeling with dry needling and better tolerance for therapeutic activity. She will continue to benefit from physical therapy to address her pain level, improve her R shoulder ROM and strength, and improve her functional mobility.     Clinical Impairments Affecting  Rehab Potential  Pt has experienced symptoms in the R shoulder for > 1 year and has 25% partial tear in supraspinatus    PT Treatment/Interventions  ADLs/Self Care Home Management;Cryotherapy;Electrical Stimulation;Iontophoresis 55m/ml Dexamethasone;Moist Heat;Ultrasound;Functional mobility training;Therapeutic activities;Therapeutic exercise;Neuromuscular re-education;Patient/family education;Manual techniques;Scar mobilization;Passive range of motion;Dry needling;Taping    Consulted and Agree with Plan of Care  Patient       Patient will benefit from skilled therapeutic intervention in order to improve the following deficits and impairments:  Hypomobility, Decreased scar mobility, Decreased activity tolerance, Decreased strength, Impaired UE functional use, Pain, Increased muscle spasms, Decreased range of motion, Improper body mechanics, Postural dysfunction, Impaired flexibility  Visit Diagnosis: Chronic right shoulder pain  Muscle weakness (generalized)  Stiffness of right shoulder, not elsewhere classified     Problem List Patient Active Problem List   Diagnosis Date Noted  . Elevated blood pressure reading 09/06/2017  . Right rotator cuff tear 08/20/2017  . Cervical disc disorder with radiculopathy of cervical region 04/25/2016  . Nonallopathic lesion of cervical region 04/17/2016  . Nonallopathic lesion of thoracic region 04/17/2016  . Nonallopathic lesion of rib cage 04/17/2016  . Nonallopathic lesion of lumbosacral region 04/17/2016  . Degenerative cervical disc 03/29/2016  . Degenerative arthritis of right knee 03/29/2016  . 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 Adjustment disorder with mixed anxiety and depressed mood 07/14/2014  . Hx of colonic polyps   . 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 Chronic constipation 03/05/2013  . Coronary atherosclerosis of native coronary  artery 08/27/2011  . History of uterine cancer 08/15/2011  .  Hypercholesterolemia 08/15/2011  . History of colonic polyps 12/29/2009    Shirline Frees, SPT 11/20/2017, 12:19 PM  [facility] [address]  [place], [place], [address] Phone: [phone]   Fax:  [phone]  Name: Sandra [name] MRN: [id number] Date of Birth: 16-Mar-1961  PHYSICAL THERAPY DISCHARGE SUMMARY  Visits from Start of Care: 4  Current functional level related to goals / functional outcomes:   Pt called to cancel all remaining appointments due to returning to work and stated she would get her PT there.   Remaining deficits:   Refer to above note.   Education / Equipment:   HEP  Plan: Patient agrees to discharge.  Patient goals were not met. Patient is being discharged due to not returning since the last visit.  ?????     Percival Spanish, PT, MPT 01/02/18, 9:44 AM  [facility] [address]  [place], [place], [address] Phone: [phone]   Fax:  [phone]

## 2017-11-20 NOTE — Patient Instructions (Signed)

## 2017-12-01 ENCOUNTER — Encounter: Payer: Self-pay | Admitting: Internal Medicine

## 2017-12-02 ENCOUNTER — Encounter: Payer: 59 | Admitting: Physical Therapy

## 2017-12-02 MED ORDER — BUPROPION HCL ER (XL) 150 MG PO TB24: 300.0000 mg | ORAL_TABLET | Freq: Every day | ORAL | 3 refills | Status: DC

## 2017-12-02 MED ORDER — ESCITALOPRAM OXALATE 10 MG PO TABS: 20.0000 mg | ORAL_TABLET | Freq: Every day | ORAL | 3 refills | Status: DC

## 2017-12-11 ENCOUNTER — Encounter: Payer: 59 | Admitting: Physical Therapy

## 2017-12-18 ENCOUNTER — Encounter: Payer: 59 | Admitting: Physical Therapy

## 2017-12-22 MED FILL — buPROPion HCL ER (XL) 150 M: 150 | 30 days supply | Qty: 60 | Fill #0

## 2017-12-22 MED FILL — ESCITALOPRAM 10 MG TABLET: 10 | 30 days supply | Qty: 60 | Fill #0

## 2017-12-24 ENCOUNTER — Ambulatory Visit (INDEPENDENT_AMBULATORY_CARE_PROVIDER_SITE_OTHER): Payer: 59 | Admitting: Obstetrics & Gynecology

## 2017-12-24 ENCOUNTER — Encounter: Payer: Self-pay | Admitting: Obstetrics & Gynecology

## 2017-12-24 VITALS — BP 126/78 | Ht 62.0 in | Wt 150.0 lb

## 2017-12-24 DIAGNOSIS — Z01419 Encounter for gynecological examination (general) (routine) without abnormal findings: Secondary | ICD-10-CM | POA: Diagnosis not present

## 2017-12-24 DIAGNOSIS — Z780 Asymptomatic menopausal state: Secondary | ICD-10-CM

## 2017-12-24 DIAGNOSIS — Z9889 Other specified postprocedural states: Secondary | ICD-10-CM | POA: Diagnosis not present

## 2017-12-24 DIAGNOSIS — Z90710 Acquired absence of both cervix and uterus: Secondary | ICD-10-CM | POA: Diagnosis not present

## 2017-12-24 DIAGNOSIS — Z8542 Personal history of malignant neoplasm of other parts of uterus: Secondary | ICD-10-CM | POA: Diagnosis not present

## 2017-12-24 NOTE — Progress Notes (Signed)
Sandra [name] 1960-09-15 [id number]   History:    57 y.o. G3P2A1L2 Separated.    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  Preserved her ovaries.  Menopause, well on no HRT.  Pap tests negative every year.  Last Pelvic US negative in 2017.  No pelvic pain.  Urine and bowel movements normal.  Abstinent.  Breasts wnl.  BMI 27.44.  Health labs with Fam MD.  Past medical history,surgical history, family history and social history were all reviewed and documented in the EPIC chart.  Gynecologic History No LMP recorded. Patient has had a hysterectomy. Contraception: status post hysterectomy Last Pap: 09/2016. Results were: Negative Last mammogram: 09/2015. Results were: Negative Bone Density: Never Colonoscopy: 2015, benign polyps, 5 yr schedule  Obstetric History OB History  Gravida Para Term Preterm AB Living  3 2     1 2   SAB TAB Ectopic Multiple Live Births  1            # Outcome Date GA Lbr Len/2nd Weight Sex Delivery Anes PTL Lv  3 SAB           2 Para           1 Para              ROS: A ROS was performed and pertinent positives and negatives are included in the history.  GENERAL: No fevers or chills. HEENT: No change in vision, no earache, sore throat or sinus congestion. NECK: No pain or stiffness. CARDIOVASCULAR: No chest pain or pressure. No palpitations. PULMONARY: No shortness of breath, cough or wheeze. GASTROINTESTINAL: No abdominal pain, nausea, vomiting or diarrhea, melena or bright red blood per rectum. GENITOURINARY: No urinary frequency, urgency, hesitancy or dysuria. MUSCULOSKELETAL: No joint or muscle pain, no back pain, no recent trauma. DERMATOLOGIC: No rash, no itching, no lesions. ENDOCRINE: No polyuria, polydipsia, no heat or cold intolerance. No recent change in weight. HEMATOLOGICAL: No anemia or easy bruising or bleeding.  NEUROLOGIC: No headache, seizures, numbness, tingling or weakness. PSYCHIATRIC: No depression, no loss of interest in normal activity or change in sleep pattern.     Exam:   BP 126/78   Ht 5\' 2"  (1.575 m)   Wt 150 lb (68 kg)   BMI 27.44 kg/m   Body mass index is 27.44 kg/m.  General appearance : Well developed well nourished female. No acute distress HEENT: Eyes: no retinal hemorrhage or exudates,  Neck supple, trachea midline, no carotid bruits, no thyroidmegaly Lungs: Clear to auscultation, no rhonchi or wheezes, or rib retractions  Heart: Regular rate and rhythm, no murmurs or gallops Breast:Examined in sitting and supine position were symmetrical in appearance, no palpable masses or tenderness,  no skin retraction, no nipple inversion, no nipple discharge, no skin discoloration, no axillary or supraclavicular lymphadenopathy Abdomen: no palpable masses or tenderness, no rebound or guarding Extremities: no edema or skin discoloration or tenderness  Pelvic: Vulva: Normal             Vagina: No gross lesions or discharge.  Pap reflex done.  Cervix/Uterus absent  Adnexa  Without masses or tenderness  Anus: Normal   Assessment/Plan:  57 y.o. female for annual exam   1. Encounter for gynecological examination Gynecologic exam status post TAH in menopause.  Pap reflex done on the vaginal vault.  Breast exam normal.  Schedule screening mammogram  now.  Body mass index 27.44.  Recommend increased physical activity with aerobic activities 5 times a week and weightlifting every 2 days.  Health labs with family physician.  Colonoscopy will be due in 2020. - Pap IG w/ reflex to HPV when ASC-U  2. S/P TAH (total abdominal hysterectomy)  3. History of uterine cancer TAH/Bilateral Salpingectomy in 2006.  Well differentiated Endometrial Endometrioid AdenoCa Stage 1A.  No evidence of recurrence.  4. Menopause present Well on no HRT.  Recommend vitamin D supplements, calcium intake of 1.5  g/day including nutritional and supplemental, regular weightbearing physical activity.  Will plan a bone density in the coming years.  Princess Bruins MD, 4:37 PM 12/24/2017

## 2017-12-25 ENCOUNTER — Encounter: Payer: 59 | Admitting: Physical Therapy

## 2017-12-25 LAB — PAP IG W/ RFLX HPV ASCU

## 2017-12-26 ENCOUNTER — Encounter: Payer: Self-pay | Admitting: *Deleted

## 2017-12-28 ENCOUNTER — Encounter: Payer: Self-pay | Admitting: Obstetrics & Gynecology

## 2017-12-28 NOTE — Patient Instructions (Addendum)
1. Encounter for gynecological examination Gynecologic exam status post TAH in menopause.  Pap reflex done on the vaginal vault.  Breast exam normal.  Schedule screening mammogram now.  Body mass index 27.44.  Recommend increased physical activity with aerobic activities 5 times a week and weightlifting every 2 days.  Health labs with family physician.  Colonoscopy will be due in 2020. - Pap IG w/ reflex to HPV when ASC-U  2. S/P TAH (total abdominal hysterectomy)  3. History of uterine cancer TAH/Bilateral Salpingectomy in 2006.  Well differentiated Endometrial Endometrioid AdenoCa Stage 1A.  No evidence of recurrence.  4. Menopause present Well on no HRT.  Recommend vitamin D supplements, calcium intake of 1.5 g/day including nutritional and supplemental, regular weightbearing physical activity.  Will plan a bone density in the coming years.  Sandra [name], it was a pleasure meeting you today!  I will inform you of your results as soon as they are available.

## 2018-01-05 MED FILL — ONDANSETRON ODT 4 MG TABLET: 4 | 5 days supply | Qty: 30 | Fill #1

## 2018-01-16 DIAGNOSIS — Z9889 Other specified postprocedural states: Secondary | ICD-10-CM | POA: Diagnosis not present

## 2018-02-02 DIAGNOSIS — H25813 Combined forms of age-related cataract, bilateral: Secondary | ICD-10-CM | POA: Diagnosis not present

## 2018-02-02 DIAGNOSIS — H35373 Puckering of macula, bilateral: Secondary | ICD-10-CM | POA: Diagnosis not present

## 2018-02-19 DIAGNOSIS — H2511 Age-related nuclear cataract, right eye: Secondary | ICD-10-CM | POA: Diagnosis not present

## 2018-03-01 MED FILL — ESCITALOPRAM 10 MG TABLET: 10 | 30 days supply | Qty: 60 | Fill #1

## 2018-03-01 MED FILL — buPROPion HCL ER (XL) 150 M: 150 | 30 days supply | Qty: 60 | Fill #1

## 2018-03-12 NOTE — Progress Notes (Deleted)
Subjective:    Patient ID: Sandra [name], female    DOB: May 13, 1960, 57 y.o.   MRN: [id number]  HPI The patient is here for follow up.  Diabetes: She had a gastric bypass surgery in 2015 and sugars have been in the normal range since then.  She is compliant with a diabetic diet. She is active, but not exercising regularly.   Depression, anxiety:  Six months ago she was struggling with depression since her father died three years ago and some anxiety.  We started lexapro and wellbutrin and increased the doses in July.  She is taking all of her medications as prescribed.      Medications and allergies reviewed with patient and updated if appropriate.  Patient Active Problem List   Diagnosis Date Noted  . Elevated blood pressure reading 09/06/2017  . Right rotator cuff tear 08/20/2017  . Cervical disc disorder with radiculopathy of cervical region 04/25/2016  . Nonallopathic lesion of cervical region 04/17/2016  . Nonallopathic lesion of thoracic region 04/17/2016  . Nonallopathic lesion of rib cage 04/17/2016  . Nonallopathic lesion of lumbosacral region 04/17/2016  . Degenerative cervical disc 03/29/2016  . Degenerative arthritis of right knee 03/29/2016  . 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 Adjustment disorder with mixed anxiety and depressed mood 07/14/2014  . Hx of colonic polyps   . 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 Chronic constipation 03/05/2013  . Coronary atherosclerosis of native coronary artery 08/27/2011  . History of uterine cancer 08/15/2011  . Hypercholesterolemia 08/15/2011  . History of colonic polyps 12/29/2009    Current Outpatient Medications on File Prior to Visit  Medication Sig Dispense Refill  . buPROPion (WELLBUTRIN XL) 150 MG 24 hr tablet Take 2 tablets (300 mg total) by mouth daily. 60 tablet 3  . Calcium Carb-Cholecalciferol (CALCIUM PLUS VITAMIN D3 PO) Take 1 tablet by mouth 3 (three) times daily.     .  Cyanocobalamin (B-12) 1000 MCG SUBL Place 1,000 mcg under the tongue daily.    Marland Kitchen docusate sodium (COLACE) 100 MG capsule Take 1 capsule (100 mg total) by mouth 3 (three) times daily as needed. 20 capsule 0  . escitalopram (LEXAPRO) 10 MG tablet Take 2 tablets (20 mg total) by mouth daily. 60 tablet 3  . linaclotide (LINZESS) 145 MCG CAPS capsule Take 1 capsule (145 mcg total) by mouth daily before breakfast. (Patient taking differently: Take 145 mcg by mouth daily as needed (constipation). ) 30 capsule 11  . Multiple Vitamins-Minerals (MULTIVITAMIN PO) Take 1 tablet by mouth 2 (two) times daily.     . ondansetron (ZOFRAN ODT) 4 MG disintegrating tablet Take 1 tablet (4 mg total) by mouth every 8 (eight) hours as needed for nausea or vomiting (May take 2 if one ineffective). 30 tablet 3  . Vitamin D, Ergocalciferol, (DRISDOL) 50000 units CAPS capsule TAKE 1 CAPSULE BY MOUTH EVERY 7 DAYS 12 capsule 0   No current facility-administered medications on file prior to visit.     Past Medical History:  Diagnosis Date  . Anxiety   . Arthritis   . CAD (coronary artery disease)    nonobstructive CAD in LAD per 2013 CTA coronary morph  . Cancer [facility]) 2006   uterine  . Complication of anesthesia PT STATES HAS LETTER FROM [place] [facility]. STATING THAT SHE HAS A VERY SMALL AIRWAY (LAST SURG. HYSTERECTOMY AT [facility] IN 2006   PT STATES LYNN BEASON CRNA WHOM WORKS HERE TOLD SHE WOULD BE OKAY TO  BE DONE AT [facility]  . Depression   . Diabetes mellitus    no meds taken now, checks cbg once or twice a week   . 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 GERD (gastroesophageal reflux disease)   . Heart murmur    with last visit, MD states "outgrown" murmur  . Hemorrhoids, internal, with bleeding and Grade 2 prolapse 03/05/2013   All positions seen an anoscopy RP columns banded 03/05/2013 LL and RA banded 03/18/2013     . History of endometrial cancer 2006 ---- S/P ABD. HYSTERECTOMY   STAGE I ENDOMETROID  CARCINOMA  . Hx of adenomatous colonic polyps 2011   BENIGN  . Hyperlipidemia    no meds now  . Hypertension   . Morbid obesity, weight - 203, BMI - 38.4 05/12/2013  . OSA on CPAP    cpap setting of 16  . Sleep apnea    no CPAP since surgery    Past Surgical History:  Procedure Laterality Date  . ABDOMINAL HYSTERECTOMY  12-20-2004   TAH  . BILATERAL BREAST REDUCTION  11-16-1999  . BREATH TEK H PYLORI N/A 05/27/2013   Procedure: BREATH TEK H PYLORI;  Surgeon: Shann Medal, MD;  Location: [facility];  Service: General;  Laterality: N/A;  . [place]  . COLONOSCOPY    . COLONOSCOPY N/A 04/25/2014   Procedure: COLONOSCOPY;  Surgeon: Gatha Mayer, MD;  Location: [facility];  Service: Endoscopy;  Laterality: N/A;  . CYSTOSCOPY  09/06/2011   Procedure: CYSTOSCOPY;  Surgeon: Ailene Rud, MD;  Location: [facility];  Service: Urology;  Laterality: N/A;  LYNX SLING   . ESOPHAGOGASTRODUODENOSCOPY (EGD) WITH PROPOFOL N/A 09/27/2014   Procedure: ESOPHAGOGASTRODUODENOSCOPY (EGD) WITH PROPOFOL;  Surgeon: Gatha Mayer, MD;  [facility];  Service: Endoscopy;  Laterality: N/A;  . ESOPHAGOGASTRODUODENOSCOPY (EGD) WITH PROPOFOL N/A 03/11/2016   Procedure: ESOPHAGOGASTRODUODENOSCOPY (EGD) WITH PROPOFOL;  Surgeon: Jerene Bears, MD;  [facility];  Service: Endoscopy;  Laterality: N/A;  . GASTRIC BYPASS  10/04/2013  . GASTRIC ROUX-EN-Y N/A 10/04/2013   Procedure: LAPAROSCOPIC ROUX-EN-Y GASTRIC BYPASS WITH UPPER ENDOSCOPY;  Surgeon: Shann Medal, MD;  [facility];  Service: General;  Laterality: N/A;  . HEMORRHOID BANDING  02/2013  . PUBOVAGINAL SLING  09/06/2011   Procedure: Gaynelle Arabian;  Surgeon: Ailene Rud, MD;  Location: [facility];  Service: Urology;  Laterality: N/A;  . SHOULDER ARTHROSCOPY Right 10/23/2017   Procedure: RIGHT SHOULDER ARTHROSCOPIC ROTATOR CUFF REPAIR VS. DEBRIDEMENT, SUBACROMIAL  DECOMPARESSION, DISTAL CLAVICAL EXCISION;  Surgeon: Tania Ade, MD;  Location: [place];  Service: Orthopedics;  Laterality: Right;  . UVULOPALATOPHARYNGOPLASTY, TONSILLECTOMY AND SEPTOPLASTY  1994    Social History   Socioeconomic History  . Marital status: Single    Spouse name: Not on file  . Number of children: Not on file  . Years of education: Not on file  . Highest education level: Not on file  Occupational History  . Not on file  Social Needs  . Financial resource strain: Not on file  . Food insecurity:    Worry: Not on file    Inability: Not on file  . Transportation needs:    Medical: Not on file    Non-medical: Not on file  Tobacco Use  . Smoking status: Never Smoker  . Smokeless tobacco: Never Used  Substance and Sexual Activity  . Alcohol use: No    Alcohol/week: 0.0 standard  drinks  . Drug use: No  . Sexual activity: Not Currently    Birth control/protection: Surgical    Comment: HYST.. 1st intercourse- 13, partners- 6  Lifestyle  . Physical activity:    Days per week: Not on file    Minutes per session: Not on file  . Stress: Not on file  Relationships  . Social connections:    Talks on phone: Not on file    Gets together: Not on file    Attends religious service: Not on file    Active member of club or organization: Not on file    Attends meetings of clubs or organizations: Not on file    Relationship status: Not on file  Other Topics Concern  . 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 BP Readings from Last 3 Encounters:  12/24/17 126/78  10/23/17 118/78  10/16/17 (!) 143/77   Wt Readings from Last 3 Encounters:  12/24/17 150 lb (68 kg)  10/23/17  154 lb 14.4 oz (70.3 kg)  10/16/17 154 lb 14.4 oz (70.3 kg)   There is no height or weight on file to calculate BMI.   Physical Exam    Constitutional: Appears well-developed and well-nourished. No distress.  HENT:  Head: Normocephalic and atraumatic.  Neck: Neck supple. No tracheal deviation present. No thyromegaly present.  No cervical lymphadenopathy Cardiovascular: Normal rate, regular rhythm and normal heart sounds.   No murmur heard. No carotid bruit .  No edema Pulmonary/Chest: Effort normal and breath sounds normal. No respiratory distress. No has no wheezes. No rales.  Skin: Skin is warm and dry. Not diaphoretic.  Psychiatric: Normal mood and affect. Behavior is normal.      Assessment & Plan:    See Problem List for Assessment and Plan of chronic medical problems.

## 2018-03-13 ENCOUNTER — Ambulatory Visit: Payer: 59 | Admitting: Internal Medicine

## 2018-04-27 DIAGNOSIS — H2512 Age-related nuclear cataract, left eye: Secondary | ICD-10-CM | POA: Diagnosis not present

## 2018-04-29 MED FILL — ONDANSETRON ODT 4 MG TABLET: 4 | 5 days supply | Qty: 30 | Fill #2

## 2018-04-30 DIAGNOSIS — H268 Other specified cataract: Secondary | ICD-10-CM | POA: Diagnosis not present

## 2018-04-30 DIAGNOSIS — H2512 Age-related nuclear cataract, left eye: Secondary | ICD-10-CM | POA: Diagnosis not present

## 2018-05-01 MED FILL — KETOROLAC 0.4% OPHTH SOLN: 0.4 | 25 days supply | Qty: 5 | Fill #0

## 2018-05-02 NOTE — Progress Notes (Signed)
Subjective:    Patient ID: Sandra [name], female    DOB: 1960/12/06, 58 y.o.   MRN: [id number]  HPI The patient is here for follow up.  Diabetes: She had a gastric bypass in 2015 and her sugars have been controlled since then with diet. She is compliant with a diabetic diet. She is not exercising regularly, but is active at work. She denies foot lesions. She is up-to-date with an ophthalmology examination.   Depression, mild anxiety: She is taking her medication daily as prescribed.  We had increased them over the summer. She has a very dry mouth as a side effects from the medication - she thinks it is the wellbutrin and decreased her dose to 1 pill a day. This helped.  She feels her depression is well controlled and she is happy with her current dose of medication.  She is still experiencing anxiety.  She does with that her anxiety was better controlled.  She often has difficulty falling asleep.  Left elbow throbs all the time.  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  She has pain localized to the lateral elbow.  She has pain that radiates to her 4-5th fingers. Those fingers feel asleep a lot.  She has tried ice and heat and neither works.  It started 3-4 weeks ago.    Medications and allergies reviewed with patient and updated if appropriate.  Patient Active Problem List   Diagnosis Date Noted  . Left elbow pain 05/04/2018  . Elevated blood pressure reading 09/06/2017  . Right rotator cuff tear 08/20/2017  . Cervical disc disorder with radiculopathy of cervical region 04/25/2016  . Nonallopathic lesion of cervical region 04/17/2016  . Nonallopathic lesion of thoracic region 04/17/2016  . Nonallopathic lesion of rib cage 04/17/2016  . Nonallopathic lesion of lumbosacral region 04/17/2016  . Degenerative cervical disc 03/29/2016  . Degenerative arthritis of right knee 03/29/2016  . 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 Adjustment disorder with mixed anxiety  and depressed mood 07/14/2014  . Hx of colonic polyps   . 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 Chronic constipation 03/05/2013  . Coronary atherosclerosis of native coronary artery 08/27/2011  . History of uterine cancer 08/15/2011  . Diabetes ([place]) 08/15/2011  . Hypercholesterolemia 08/15/2011  . History of colonic polyps 12/29/2009    Current Outpatient Medications on File Prior to Visit  Medication Sig Dispense Refill  . Calcium Carb-Cholecalciferol (CALCIUM PLUS VITAMIN D3 PO) Take 1 tablet by mouth 3 (three) times daily.     . Cyanocobalamin (B-12) 1000 MCG SUBL Place 1,000 mcg under the tongue daily.    Marland Kitchen docusate sodium (COLACE) 100 MG capsule Take 1 capsule (100 mg total) by mouth 3 (three) times daily as needed. 20 capsule 0  . escitalopram (LEXAPRO) 10 MG tablet Take 2 tablets (20 mg total) by mouth daily. 60 tablet 3  . linaclotide (LINZESS) 145 MCG CAPS capsule Take 1 capsule (145 mcg total) by mouth daily before breakfast. (Patient taking differently: Take 145 mcg by mouth daily as needed (constipation). ) 30 capsule 11  . Multiple Vitamins-Minerals (MULTIVITAMIN PO) Take 1 tablet by mouth 2 (two) times daily.     . ondansetron (ZOFRAN ODT) 4 MG disintegrating tablet Take 1 tablet (4 mg total) by mouth every 8 (eight) hours as needed for nausea or vomiting (May take 2 if one ineffective). 30 tablet 3  . Vitamin D, Ergocalciferol, (DRISDOL) 50000 units CAPS capsule TAKE 1  CAPSULE BY MOUTH EVERY 7 DAYS 12 capsule 0   No current facility-administered medications on file prior to visit.     Past Medical History:  Diagnosis Date  . Anxiety   . Arthritis   . CAD (coronary artery disease)    nonobstructive CAD in LAD per 2013 CTA coronary morph  . Cancer [facility]) 2006   uterine  . Complication of anesthesia PT STATES HAS LETTER FROM [place] [facility]. STATING THAT SHE HAS A VERY SMALL AIRWAY (LAST SURG. HYSTERECTOMY AT [facility] IN 2006   PT STATES LYNN BEASON CRNA  WHOM WORKS HERE TOLD SHE WOULD BE OKAY TO BE DONE AT [facility]  . Depression   . Diabetes mellitus    no meds taken now, checks cbg once or twice a week   . 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 GERD (gastroesophageal reflux disease)   . Heart murmur    with last visit, MD states "outgrown" murmur  . Hemorrhoids, internal, with bleeding and Grade 2 prolapse 03/05/2013   All positions seen an anoscopy RP columns banded 03/05/2013 LL and RA banded 03/18/2013     . History of endometrial cancer 2006 ---- S/P ABD. HYSTERECTOMY   STAGE I ENDOMETROID CARCINOMA  . Hx of adenomatous colonic polyps 2011   BENIGN  . Hyperlipidemia    no meds now  . Hypertension   . Morbid obesity, weight - 203, BMI - 38.4 05/12/2013  . OSA on CPAP    cpap setting of 16  . Sleep apnea    no CPAP since surgery    Past Surgical History:  Procedure Laterality Date  . ABDOMINAL HYSTERECTOMY  12-20-2004   TAH  . BILATERAL BREAST REDUCTION  11-16-1999  . BREATH TEK H PYLORI N/A 05/27/2013   Procedure: BREATH TEK H PYLORI;  Surgeon: Shann Medal, MD;  Location: [facility];  Service: General;  Laterality: N/A;  . [place]  . COLONOSCOPY    . COLONOSCOPY N/A 04/25/2014   Procedure: COLONOSCOPY;  Surgeon: Gatha Mayer, MD;  [facility];  Service: Endoscopy;  Laterality: N/A;  . CYSTOSCOPY  09/06/2011   Procedure: CYSTOSCOPY;  Surgeon: Ailene Rud, MD;  [facility];  Service: Urology;  Laterality: N/A;  LYNX SLING   . ESOPHAGOGASTRODUODENOSCOPY (EGD) WITH PROPOFOL N/A 09/27/2014   Procedure: ESOPHAGOGASTRODUODENOSCOPY (EGD) WITH PROPOFOL;  Surgeon: Gatha Mayer, MD;  [facility];  Service: Endoscopy;  Laterality: N/A;  . ESOPHAGOGASTRODUODENOSCOPY (EGD) WITH PROPOFOL N/A 03/11/2016   Procedure: ESOPHAGOGASTRODUODENOSCOPY (EGD) WITH PROPOFOL;  Surgeon: Jerene Bears, MD;  [facility];  Service: Endoscopy;  Laterality: N/A;    . GASTRIC BYPASS  10/04/2013  . GASTRIC ROUX-EN-Y N/A 10/04/2013   Procedure: LAPAROSCOPIC ROUX-EN-Y GASTRIC BYPASS WITH UPPER ENDOSCOPY;  Surgeon: Shann Medal, MD;  [facility];  Service: General;  Laterality: N/A;  . HEMORRHOID BANDING  02/2013  . PUBOVAGINAL SLING  09/06/2011   Procedure: Gaynelle Arabian;  Surgeon: Ailene Rud, MD;  Location: [facility];  Service: Urology;  Laterality: N/A;  . SHOULDER ARTHROSCOPY Right 10/23/2017   Procedure: RIGHT SHOULDER ARTHROSCOPIC ROTATOR CUFF REPAIR VS. DEBRIDEMENT, SUBACROMIAL DECOMPARESSION, DISTAL CLAVICAL EXCISION;  Surgeon: Tania Ade, MD;  Location: [place];  Service: Orthopedics;  Laterality: Right;  . UVULOPALATOPHARYNGOPLASTY, TONSILLECTOMY AND SEPTOPLASTY  1994    Social History   Socioeconomic History  . Marital status: Single    Spouse name: Not on file  .  Number of children: Not on file  . Years of education: Not on file  . Highest education level: Not on file  Occupational History  . Not on file  Social Needs  . Financial resource strain: Not on file  . Food insecurity:    Worry: Not on file    Inability: Not on file  . Transportation needs:    Medical: Not on file    Non-medical: Not on file  Tobacco Use  . Smoking status: Never Smoker  . Smokeless tobacco: Never Used  Substance and Sexual Activity  . Alcohol use: No    Alcohol/week: 0.0 standard drinks  . Drug use: No  . Sexual activity: Not Currently    Birth control/protection: Surgical    Comment: HYST.. 1st intercourse- 13, partners- 6  Lifestyle  . Physical activity:    Days per week: Not on file    Minutes per session: Not on file  . Stress: Not on file  Relationships  . Social connections:    Talks on phone: Not on file    Gets together: Not on file    Attends religious service: Not on file    Active member of club or organization: Not on file    Attends meetings of clubs or organizations: Not on file     Relationship status: Not on file  Other Topics Concern  . 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  Respiratory: Negative for cough, shortness of breath and wheezing.   Cardiovascular: Negative for chest pain, palpitations and leg swelling.  Neurological: Negative for light-headedness and headaches.       Objective:   Vitals:   05/04/18 0752  BP: (!) 154/88  Pulse: 68  Resp: 16  Temp: 98.8 F (37.1 C)  SpO2: 98%   BP Readings from Last 3 Encounters:  05/04/18 (!) 154/88  12/24/17 126/78  10/23/17 118/78   Wt Readings from Last 3 Encounters:  05/04/18 152 lb 12.8 oz (69.3 kg)  12/24/17 150 lb (68 kg)  10/23/17 154 lb 14.4 oz (70.3 kg)   Body mass index is 27.95 kg/m.   Physical Exam    Constitutional: Appears well-developed and well-nourished. No distress.  HENT:  Head: Normocephalic and atraumatic.  Neck: Neck supple. No tracheal deviation present. No thyromegaly present.  No cervical lymphadenopathy Cardiovascular: Normal rate, regular rhythm and normal heart sounds.   No murmur heard. No carotid bruit .  No edema Pulmonary/Chest: Effort normal and breath sounds normal. No respiratory distress. No has no wheezes. No rales.  Msk: Tenderness with palpation lateral left elbow, normal sensation on exam Skin: Skin is warm and dry. Not diaphoretic.  Psychiatric: Normal mood and affect. Behavior is normal.   Diabetic Foot Exam - Simple   Simple Foot Form Diabetic Foot exam was performed with the following findings:  Yes   Visual Inspection No deformities, no ulcerations, no other skin breakdown bilaterally:  Yes Sensation Testing Intact to touch and monofilament testing bilaterally:   Yes Pulse Check Posterior Tibialis and Dorsalis pulse intact bilaterally:  Yes Comments       Assessment & Plan:    See Problem List for Assessment and Plan of chronic medical  problems.

## 2018-05-04 ENCOUNTER — Ambulatory Visit: Payer: 59 | Admitting: Internal Medicine

## 2018-05-04 ENCOUNTER — Other Ambulatory Visit (INDEPENDENT_AMBULATORY_CARE_PROVIDER_SITE_OTHER): Payer: 59

## 2018-05-04 ENCOUNTER — Encounter: Payer: Self-pay | Admitting: Internal Medicine

## 2018-05-04 VITALS — BP 154/88 | HR 68 | Temp 98.8°F | Resp 16 | Ht 62.0 in | Wt 152.8 lb

## 2018-05-04 DIAGNOSIS — E119 Type 2 diabetes mellitus without complications: Secondary | ICD-10-CM | POA: Diagnosis not present

## 2018-05-04 DIAGNOSIS — M25522 Pain in left elbow: Secondary | ICD-10-CM

## 2018-05-04 DIAGNOSIS — F4323 Adjustment disorder with mixed anxiety and depressed mood: Secondary | ICD-10-CM

## 2018-05-04 LAB — COMPREHENSIVE METABOLIC PANEL
ALT: 20 U/L (ref 0–35)
AST: 15 U/L (ref 0–37)
Albumin: 4.7 g/dL (ref 3.5–5.2)
Alkaline Phosphatase: 59 U/L (ref 39–117)
BUN: 21 mg/dL (ref 6–23)
CO2: 30 mEq/L (ref 19–32)
Calcium: 9.9 mg/dL (ref 8.4–10.5)
Chloride: 103 mEq/L (ref 96–112)
Creatinine, Ser: 0.58 mg/dL (ref 0.40–1.20)
GFR: 113.31 mL/min (ref >60.00)
Glucose, Bld: 137 mg/dL — ABNORMAL HIGH (ref 70–99)
Potassium: 3.4 mEq/L — ABNORMAL LOW (ref 3.5–5.1)
Sodium: 142 mEq/L (ref 135–145)
Total Bilirubin: 0.4 mg/dL (ref 0.2–1.2)
Total Protein: 7.1 g/dL (ref 6.0–8.3)

## 2018-05-04 LAB — HEMOGLOBIN A1C: Hgb A1c MFr Bld: 6.7 % — ABNORMAL HIGH (ref 4.6–6.5)

## 2018-05-04 MED ORDER — BUPROPION HCL ER (XL) 150 MG PO TB24: 150.0000 mg | ORAL_TABLET | Freq: Every day | ORAL | 5 refills | Status: DC

## 2018-05-04 NOTE — Assessment & Plan Note (Signed)
Eye exam up to date Compliant with a diabetic diet - will check a1c Active, but not exercising Diabetes diet controlled FU in 6 months

## 2018-05-04 NOTE — Patient Instructions (Addendum)
  Tests ordered today. Your results will be released to [place] (or called to you) after review, usually within 72hours after test completion. If any changes need to be made, you will be notified at that same time.  Medications reviewed and updated.  Changes include :   Hold the wellbutrin for a few weeks.  If you still have anxiety let me know and we can consider starting buspar.     Please followup in 6 months

## 2018-05-04 NOTE — Assessment & Plan Note (Signed)
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

## 2018-05-04 NOTE — Assessment & Plan Note (Signed)
Taking Lexapro daily as prescribed Decreased Wellbutrin to 150 mg daily due to mouth dryness Does have difficulty sleeping Overall feels depression and anxiety are fairly controlled, but admits anxiety could be better Will try holding Wellbutrin since this seems to be causing mouth dryness and may possibly be affecting sleep or increasing anxiety Continue Lexapro 20 mg daily If anxiety continues to be a problem can consider BuSpar

## 2018-05-05 ENCOUNTER — Encounter: Payer: Self-pay | Admitting: Internal Medicine

## 2018-05-13 IMAGING — DX DG CERVICAL SPINE COMPLETE 4+V
5 series · 5 of 5 positions shown · non-contrast
Comparison: 12/23/2008

CLINICAL DATA: Cervical spine pain

EXAM:
CERVICAL SPINE - COMPLETE 4+ VIEW

[c-spine lat]
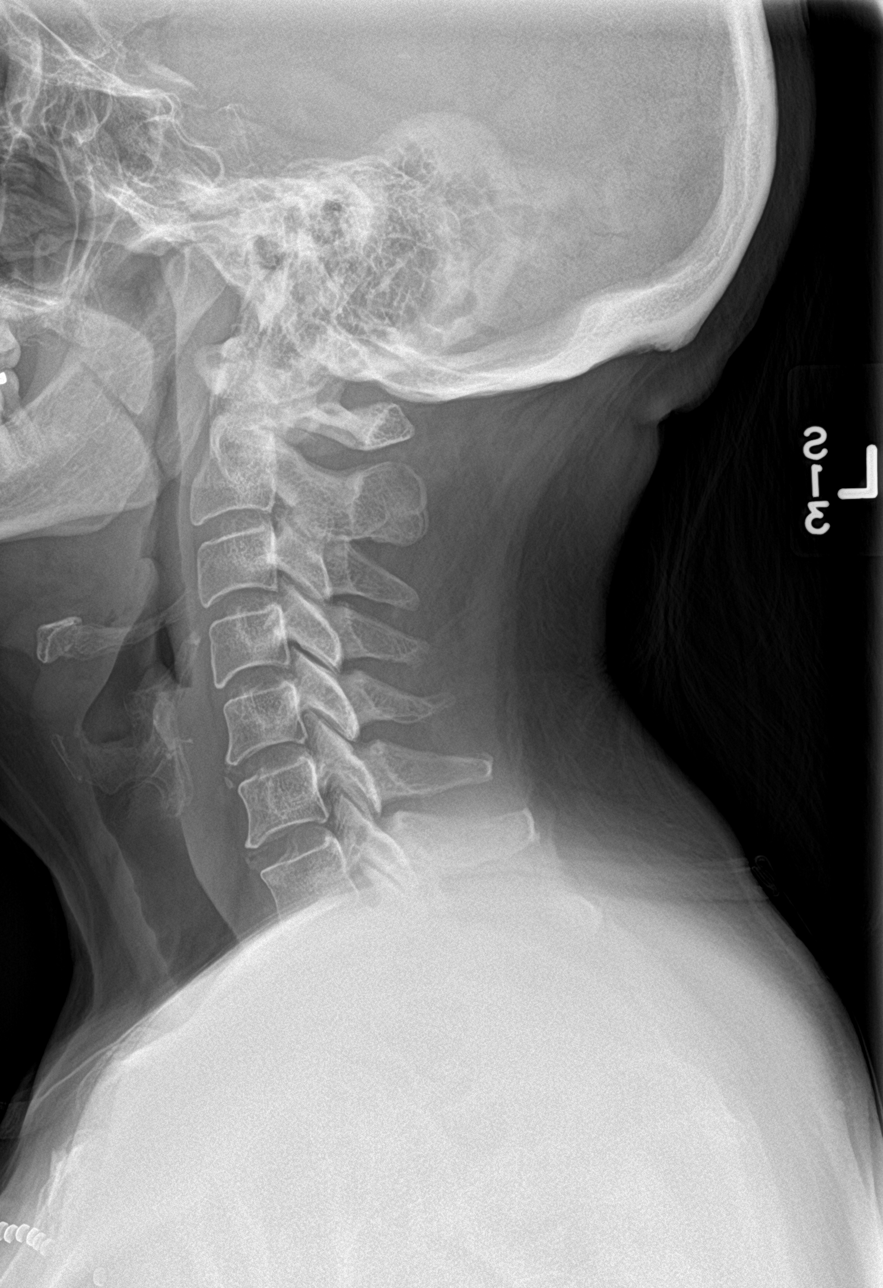

[c-spine obl (1 of 2)]
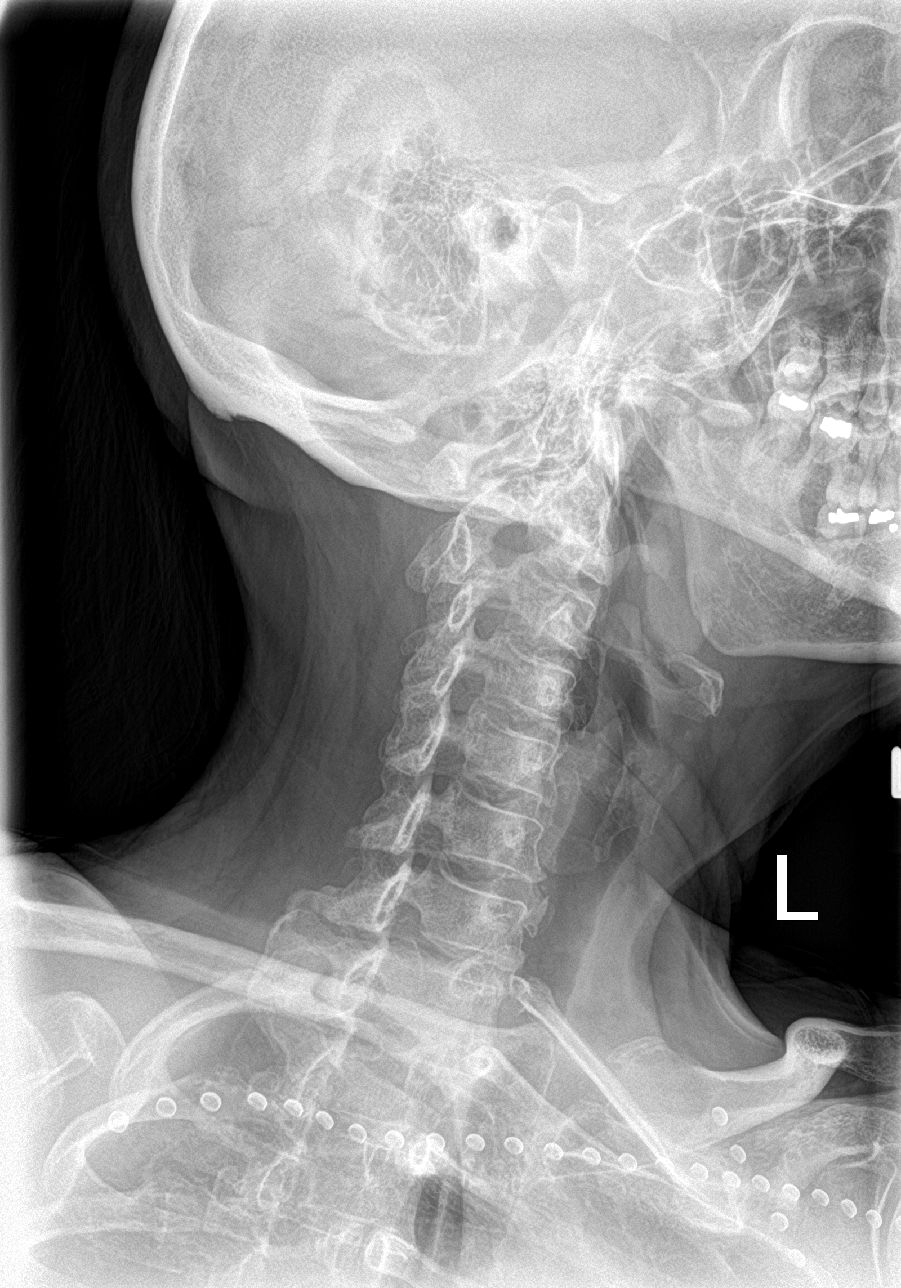

[c-spine obl (2 of 2)]
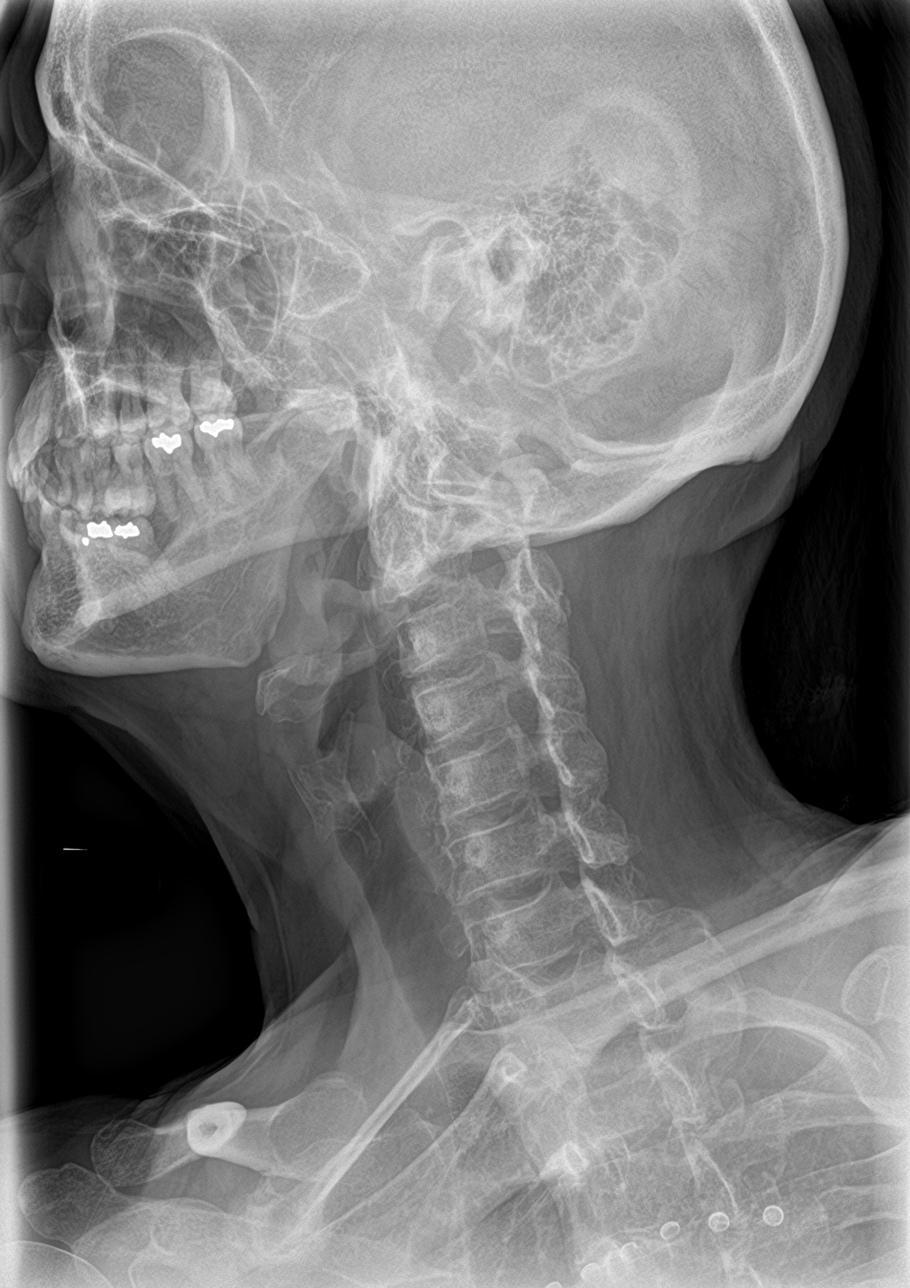

[c-spine ap]
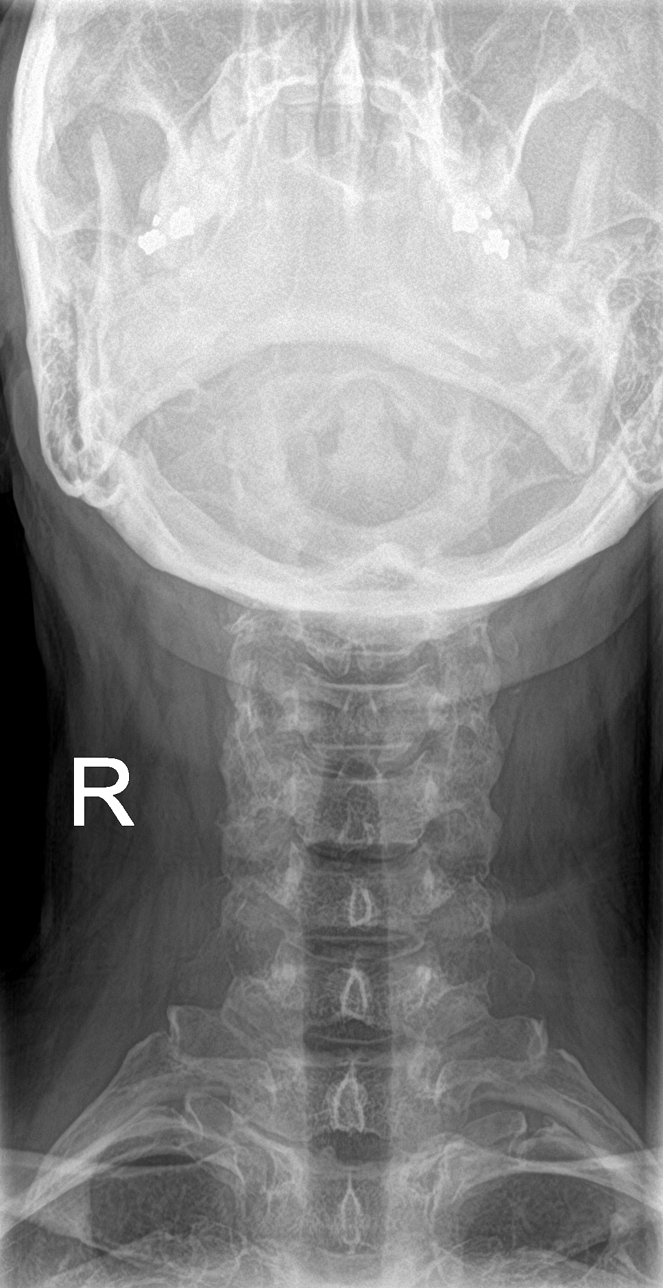

[c-spine open mouth]
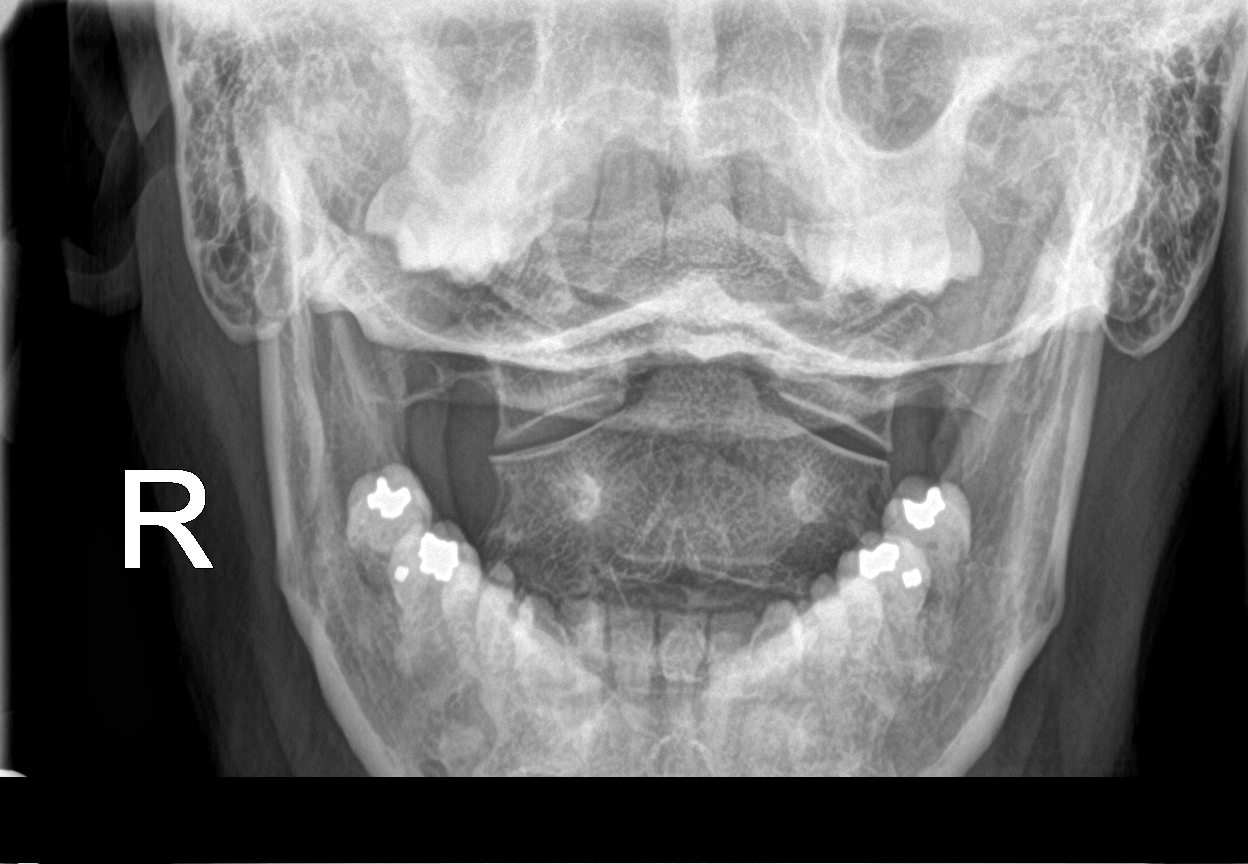

[5 of 5 positions shown; findings below may reference images not displayed]

FINDINGS: Minimal spondylosis at C5-6 and C6-7, with small endplate spurs and
annulus calcification. No disc narrowing or osseous foraminal
stenosis. No fracture deformity, endplate erosion, or evidence of
focal bone lesion. No prevertebral thickening.
IMPRESSION: Minimal spondylosis, stable from 6292. No acute finding or disc
narrowing.

## 2018-05-14 ENCOUNTER — Ambulatory Visit (INDEPENDENT_AMBULATORY_CARE_PROVIDER_SITE_OTHER)
Admission: RE | Admit: 2018-05-14 | Discharge: 2018-05-14 | Disposition: A | Discharge status: Home / Self Care | Payer: 59 | Source: Ambulatory Visit | Attending: Family Medicine | Admitting: Family Medicine

## 2018-05-14 ENCOUNTER — Ambulatory Visit: Payer: 59 | Admitting: Family Medicine

## 2018-05-14 ENCOUNTER — Encounter: Payer: Self-pay | Admitting: Family Medicine

## 2018-05-14 ENCOUNTER — Ambulatory Visit: Payer: Self-pay

## 2018-05-14 VITALS — BP 128/88 | HR 73 | Ht 62.0 in | Wt 151.0 lb

## 2018-05-14 DIAGNOSIS — M25522 Pain in left elbow: Secondary | ICD-10-CM

## 2018-05-14 DIAGNOSIS — M7712 Lateral epicondylitis, left elbow: Secondary | ICD-10-CM

## 2018-05-14 MED ORDER — GABAPENTIN 100 MG PO CAPS: 200.0000 mg | ORAL_CAPSULE | Freq: Every day | ORAL | 3 refills | Status: DC

## 2018-05-14 MED FILL — GABAPENTIN 100 MG CAPSULE: 100 | 30 days supply | Qty: 60 | Fill #0

## 2018-05-14 NOTE — Assessment & Plan Note (Signed)
Left lateral epicondylitis.  Discussed with patient at great length.  Discussed icing regimen, bracing of the wrist.  Patient did have some atypical vascularity in the area with a potential intrasubstance tearing of the common extensor muscle.  Patient's will have x-rays done today secondary to the abnormality noted.  History of uterine cancer.  Hopefully patient does respond well to conservative therapy and will follow-up with me again in 3 weeks.  Worsening symptoms or abnormality of the elbow occurs we will get advanced imaging.

## 2018-05-14 NOTE — Patient Instructions (Addendum)
God ot see you  No overhand lifting.  Ice 20 minutes 2 times daily. Usually after activity and before bed. Wrist brace day and night for 2 weeks then nightly for 3 weeks.  Xray downstairs Starting Monday Exercises 3 times a week.  Gabapentin 200mg  at night to calm down the nerve  See me again in 3 weeks so we can look at it again

## 2018-05-14 NOTE — Progress Notes (Signed)
[name] [address] [place], [place] [address] Phone: [phone] Subjective:    I'm seeing this patient by the request  of:  [name], MD   CC: Left elbow pain  [id number]  Sandra [name] is a 57 y.o. female coming in with complaint of left elbow pain. Pain started 3 weeks ago. Pain over lateral epicondyle. Tender to touch the epicondyle. 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 Patient notes that the lateral aspect of forearm feels like it is going to sleep. Pain with pronation and supination.      Past Medical History:  Diagnosis Date  . Anxiety   . Arthritis   . CAD (coronary artery disease)    nonobstructive CAD in LAD per 2013 CTA coronary morph  . Cancer [facility]) 2006   uterine  . Complication of anesthesia PT STATES HAS LETTER FROM [place] [facility]. STATING THAT SHE HAS A VERY SMALL AIRWAY (LAST SURG. HYSTERECTOMY AT [facility] IN 2006   PT STATES LYNN BEASON CRNA WHOM WORKS HERE TOLD SHE WOULD BE OKAY TO BE DONE AT [facility]  . Depression   . Diabetes mellitus    no meds taken now, checks cbg once or twice a week   . 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 GERD (gastroesophageal reflux disease)   . Heart murmur    with last visit, MD states "outgrown" murmur  . Hemorrhoids, internal, with bleeding and Grade 2 prolapse 03/05/2013   All positions seen an anoscopy RP columns banded 03/05/2013 LL and RA banded 03/18/2013     . History of endometrial cancer 2006 ---- S/P ABD. HYSTERECTOMY   STAGE I ENDOMETROID CARCINOMA  . Hx of adenomatous colonic polyps 2011   BENIGN  . Hyperlipidemia    no meds now  . Hypertension   . Morbid obesity, weight - 203, BMI - 38.4 05/12/2013  . OSA on CPAP    cpap setting of 16  . Sleep apnea    no CPAP since surgery   Past Surgical History:  Procedure Laterality Date  . ABDOMINAL HYSTERECTOMY  12-20-2004   TAH  . BILATERAL BREAST REDUCTION  11-16-1999  . BREATH TEK H PYLORI N/A  05/27/2013   Procedure: BREATH TEK H PYLORI;  Surgeon: Shann Medal, MD;  Location: [facility];  Service: General;  Laterality: N/A;  . [place]  . COLONOSCOPY    . COLONOSCOPY N/A 04/25/2014   Procedure: COLONOSCOPY;  Surgeon: Gatha Mayer, MD;  Location: [facility];  Service: Endoscopy;  Laterality: N/A;  . CYSTOSCOPY  09/06/2011   Procedure: CYSTOSCOPY;  Surgeon: Ailene Rud, MD;  Location: [facility];  Service: Urology;  Laterality: N/A;  LYNX SLING   . ESOPHAGOGASTRODUODENOSCOPY (EGD) WITH PROPOFOL N/A 09/27/2014   Procedure: ESOPHAGOGASTRODUODENOSCOPY (EGD) WITH PROPOFOL;  Surgeon: Gatha Mayer, MD;  [facility];  Service: Endoscopy;  Laterality: N/A;  . ESOPHAGOGASTRODUODENOSCOPY (EGD) WITH PROPOFOL N/A 03/11/2016   Procedure: ESOPHAGOGASTRODUODENOSCOPY (EGD) WITH PROPOFOL;  Surgeon: Jerene Bears, MD;  [facility];  Service: Endoscopy;  Laterality: N/A;  . GASTRIC BYPASS  10/04/2013  . GASTRIC ROUX-EN-Y N/A 10/04/2013   Procedure: LAPAROSCOPIC ROUX-EN-Y GASTRIC BYPASS WITH UPPER ENDOSCOPY;  Surgeon: Shann Medal, MD;  [facility];  Service: General;  Laterality: N/A;  . HEMORRHOID BANDING  02/2013  . PUBOVAGINAL SLING  09/06/2011   Procedure: Gaynelle Arabian;  Surgeon: Ailene Rud, MD;  Location: [facility];  Service: Urology;  Laterality: N/A;  . SHOULDER ARTHROSCOPY Right 10/23/2017   Procedure: RIGHT SHOULDER ARTHROSCOPIC ROTATOR CUFF REPAIR VS. DEBRIDEMENT, SUBACROMIAL DECOMPARESSION, DISTAL CLAVICAL EXCISION;  Surgeon: Tania Ade, MD;  Location: [facility];  Service: Orthopedics;  Laterality: Right;  . UVULOPALATOPHARYNGOPLASTY, TONSILLECTOMY AND SEPTOPLASTY  1994   Social History   Socioeconomic History  . Marital status: Single    Spouse name: Not on file  . Number of children: Not on file  . Years of education: Not on file  . Highest education level: Not on file    Occupational History  . Not on file  Social Needs  . Financial resource strain: Not on file  . Food insecurity:    Worry: Not on file    Inability: Not on file  . Transportation needs:    Medical: Not on file    Non-medical: Not on file  Tobacco Use  . Smoking status: Never Smoker  . Smokeless tobacco: Never Used  Substance and Sexual Activity  . Alcohol use: No    Alcohol/week: 0.0 standard drinks  . Drug use: No  . Sexual activity: Not Currently    Birth control/protection: Surgical    Comment: HYST.. 1st intercourse- 13, partners- 6  Lifestyle  . Physical activity:    Days per week: Not on file    Minutes per session: Not on file  . Stress: Not on file  Relationships  . Social connections:    Talks on phone: Not on file    Gets together: Not on file    Attends religious service: Not on file    Active member of club or organization: Not on file    Attends meetings of clubs or organizations: Not on file    Relationship status: Not on file  Other Topics Concern  . 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 Nsaids     r/t gastric surgery   Family History  Adopted: Yes  Family history unknown: Yes        Current Outpatient Medications (Hematological):  Marland Kitchen  Cyanocobalamin (B-12) 1000 MCG SUBL, Place 1,000 mcg under the tongue daily.  Current Outpatient Medications (Other):  Marland Kitchen  buPROPion (WELLBUTRIN XL) 150 MG 24 hr tablet, Take 1 tablet (150 mg total) by mouth daily. .  Calcium Carb-Cholecalciferol (CALCIUM PLUS VITAMIN D3 PO), Take 1 tablet by mouth 3 (three) times daily.  Marland Kitchen  docusate sodium (COLACE) 100 MG capsule, Take 1 capsule (100 mg total) by mouth 3 (three) times daily as needed. Marland Kitchen  escitalopram (LEXAPRO) 10 MG tablet, Take 2 tablets (20 mg total) by mouth daily. Marland Kitchen   linaclotide (LINZESS) 145 MCG CAPS capsule, Take 1 capsule (145 mcg total) by mouth daily before breakfast. (Patient taking differently: Take 145 mcg by mouth daily as needed (constipation). ) .  Multiple Vitamins-Minerals (MULTIVITAMIN PO), Take 1 tablet by mouth 2 (two) times daily.  .  ondansetron (ZOFRAN ODT) 4 MG disintegrating tablet, Take 1 tablet (4 mg total) by mouth every 8 (eight) hours as needed for nausea or vomiting (May take 2 if one ineffective). .  Vitamin D, Ergocalciferol, (DRISDOL) 50000 units CAPS capsule, TAKE 1 CAPSULE BY MOUTH EVERY 7 DAYS .  gabapentin (NEURONTIN) 100 MG capsule, Take 2 capsules (200 mg total)  by mouth at bedtime.    Past medical history, social, surgical and family history all reviewed in electronic medical record.  No pertanent information unless stated regarding to the chief complaint.   Review of Systems:  No headache, visual changes, nausea, vomiting, diarrhea, constipation, dizziness, abdominal pain, skin rash, fevers, chills, night sweats, weight loss, swollen lymph nodes, body aches, joint swelling,  chest pain, shortness of breath, mood changes. Positive muscle aches   Objective  Blood pressure 128/88, pulse 73, height 5\' 2"  (1.575 m), weight 151 lb (68.5 kg), SpO2 98 %.   General: No apparent distress alert and oriented x3 mood and affect normal, dressed appropriately.  HEENT: Pupils equal, extraocular movements intact  Respiratory: Patient's speak in full sentences and does not appear short of breath  Cardiovascular: No lower extremity edema, non tender, no erythema  Skin: Warm dry intact with no signs of infection or rash on extremities or on axial skeleton.  Abdomen: Soft nontender  Neuro: Cranial nerves II through XII are intact, neurovascularly intact in all extremities with 2+ DTRs and 2+ pulses.  Lymph: No lymphadenopathy of posterior or anterior cervical chain or axillae bilaterally.  Gait normal with good balance and coordination.    MSK:  Non tender with full range of motion and good stability and symmetric strength and tone of shoulders,  wrist, hip, knee and ankles bilaterally.  Elbow: Left Swelling over the lateral epicondylar region Range of motion full pronation, supination, flexion, extension. Strength is full to all of the above directions Stable to varus, valgus stress. Negative moving valgus stress test. Tender to palpation in the lateral epicondylar region that is also worse with any type of resisted wrist extension Ulnar nerve does not sublux. Negative cubital tunnel Tinel's.   Musculoskeletal ultrasound was performed and interpreted by Charlann Boxer D.O.   Elbow:  Lateral epicondyle and common extensor tendon origin visualized.  Increase in Doppler flow with intrasubstance tearing noted.  No avulsion noted.  Significant irregularity of the radial nerve neurovascular bundle in the forearm noted.  No true mass appreciated though.  IMPRESSION: Lateral epicondylitis with abnormal vascularity    Impression and Recommendations:     This case required medical decision making of moderate complexity. The above documentation has been reviewed and is accurate and complete Lyndal Pulley, DO       Note: This dictation was prepared with Dragon dictation along with smaller phrase technology. Any transcriptional errors that result from this process are unintentional.

## 2018-05-15 MED FILL — KETOROLAC 0.4% OPHTH SOLN: 0.4 | 25 days supply | Qty: 5 | Fill #0

## 2018-05-19 ENCOUNTER — Encounter: Payer: Self-pay | Admitting: Internal Medicine

## 2018-05-20 MED ORDER — BUSPIRONE HCL 5 MG PO TABS: 5.0000 mg | ORAL_TABLET | Freq: Three times a day (TID) | ORAL | 0 refills | Status: DC

## 2018-05-20 MED FILL — busPIRone HCL 5 MG TABS: 5 | 30 days supply | Qty: 90 | Fill #0

## 2018-05-29 LAB — HM DIABETES EYE EXAM

## 2018-06-01 DIAGNOSIS — H35341 Macular cyst, hole, or pseudohole, right eye: Secondary | ICD-10-CM | POA: Diagnosis not present

## 2018-06-01 DIAGNOSIS — H43811 Vitreous degeneration, right eye: Secondary | ICD-10-CM | POA: Diagnosis not present

## 2018-06-01 DIAGNOSIS — Z961 Presence of intraocular lens: Secondary | ICD-10-CM | POA: Diagnosis not present

## 2018-06-09 ENCOUNTER — Ambulatory Visit: Payer: 59 | Admitting: Family Medicine

## 2018-06-09 NOTE — Progress Notes (Deleted)
[name] [place] [place], [place] [address] Phone: [phone] Subjective:    I'm seeing this patient by the request  of:    CC: Left elbow pain follow-up  [id number]  Sandra [name] is a 58 y.o. female coming in with complaint of ***  Onset-  Location Duration-  Character- Aggravating factors- Reliving factors-  Therapies tried-  Severity-     Past Medical History:  Diagnosis Date  . Anxiety   . Arthritis   . CAD (coronary artery disease)    nonobstructive CAD in LAD per 2013 CTA coronary morph  . Cancer [facility]) 2006   uterine  . Complication of anesthesia PT STATES HAS LETTER FROM [place] [facility]. STATING THAT SHE HAS A VERY SMALL AIRWAY (LAST SURG. HYSTERECTOMY AT [facility] IN 2006   PT STATES LYNN BEASON CRNA WHOM WORKS HERE TOLD SHE WOULD BE OKAY TO BE DONE AT [facility]  . Depression   . Diabetes mellitus    no meds taken now, checks cbg once or twice a week   . 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 GERD (gastroesophageal reflux disease)   . Heart murmur    with last visit, MD states "outgrown" murmur  . Hemorrhoids, internal, with bleeding and Grade 2 prolapse 03/05/2013   All positions seen an anoscopy RP columns banded 03/05/2013 LL and RA banded 03/18/2013     . History of endometrial cancer 2006 ---- S/P ABD. HYSTERECTOMY   STAGE I ENDOMETROID CARCINOMA  . Hx of adenomatous colonic polyps 2011   BENIGN  . Hyperlipidemia    no meds now  . Hypertension   . Morbid obesity, weight - 203, BMI - 38.4 05/12/2013  . OSA on CPAP    cpap setting of 16  . Sleep apnea    no CPAP since surgery   Past Surgical History:  Procedure Laterality Date  . ABDOMINAL HYSTERECTOMY  12-20-2004   TAH  . BILATERAL BREAST REDUCTION  11-16-1999  . BREATH TEK H PYLORI N/A 05/27/2013   Procedure: BREATH TEK H PYLORI;  Surgeon: Shann Medal, MD;  [facility]: [facility];  Service: General;  Laterality: N/A;  . [place]    . COLONOSCOPY    . COLONOSCOPY N/A 04/25/2014   Procedure: COLONOSCOPY;  Surgeon: Gatha Mayer, MD;  [facility];  Service: Endoscopy;  Laterality: N/A;  . CYSTOSCOPY  09/06/2011   Procedure: CYSTOSCOPY;  Surgeon: Ailene Rud, MD;  Location: [facility];  Service: Urology;  Laterality: N/A;  LYNX SLING   . ESOPHAGOGASTRODUODENOSCOPY (EGD) WITH PROPOFOL N/A 09/27/2014   Procedure: ESOPHAGOGASTRODUODENOSCOPY (EGD) WITH PROPOFOL;  Surgeon: Gatha Mayer, MD;  [facility];  Service: Endoscopy;  Laterality: N/A;  . ESOPHAGOGASTRODUODENOSCOPY (EGD) WITH PROPOFOL N/A 03/11/2016   Procedure: ESOPHAGOGASTRODUODENOSCOPY (EGD) WITH PROPOFOL;  Surgeon: Jerene Bears, MD;  [facility];  Service: Endoscopy;  Laterality: N/A;  . GASTRIC BYPASS  10/04/2013  . GASTRIC ROUX-EN-Y N/A 10/04/2013   Procedure: LAPAROSCOPIC ROUX-EN-Y GASTRIC BYPASS WITH UPPER ENDOSCOPY;  Surgeon: Shann Medal, MD;  [facility];  Service: General;  Laterality: N/A;  . HEMORRHOID BANDING  02/2013  . PUBOVAGINAL SLING  09/06/2011   Procedure: Gaynelle Arabian;  Surgeon: Ailene Rud, MD;  Location[facility];  Service: Urology;  Laterality: N/A;  . SHOULDER ARTHROSCOPY Right 10/23/2017   Procedure: RIGHT SHOULDER ARTHROSCOPIC ROTATOR CUFF REPAIR VS. DEBRIDEMENT, SUBACROMIAL DECOMPARESSION, DISTAL CLAVICAL  EXCISION;  Surgeon: Tania Ade, MD;  Location: [facility];  Service: Orthopedics;  Laterality: Right;  . UVULOPALATOPHARYNGOPLASTY, TONSILLECTOMY AND SEPTOPLASTY  1994   Social History   Socioeconomic History  . Marital status: Single    Spouse name: Not on file  . Number of children: Not on file  . Years of education: Not on file  . Highest education level: Not on file  Occupational History  . Not on file  Social Needs  . Financial resource strain: Not on file  . Food insecurity:    Worry: Not on file    Inability: Not on file  .  Transportation needs:    Medical: Not on file    Non-medical: Not on file  Tobacco Use  . Smoking status: Never Smoker  . Smokeless tobacco: Never Used  Substance and Sexual Activity  . Alcohol use: No    Alcohol/week: 0.0 standard drinks  . Drug use: No  . Sexual activity: Not Currently    Birth control/protection: Surgical    Comment: HYST.. 1st intercourse- 13, partners- 6  Lifestyle  . Physical activity:    Days per week: Not on file    Minutes per session: Not on file  . Stress: Not on file  Relationships  . Social connections:    Talks on phone: Not on file    Gets together: Not on file    Attends religious service: Not on file    Active member of club or organization: Not on file    Attends meetings of clubs or organizations: Not on file    Relationship status: Not on file  Other Topics Concern  . 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 Nsaids     r/t gastric surgery   Family History  Adopted: Yes  Family history unknown: Yes        Current Outpatient Medications (Hematological):  Marland Kitchen  Cyanocobalamin (B-12) 1000 MCG SUBL, Place 1,000 mcg under the tongue daily.  Current Outpatient Medications (Other):  Marland Kitchen  buPROPion (WELLBUTRIN XL) 150 MG 24 hr tablet, Take 1 tablet (150 mg total) by mouth daily. .  busPIRone (BUSPAR) 5 MG tablet, Take 1 tablet (5 mg total) by mouth 3 (three) times daily. .  Calcium Carb-Cholecalciferol (CALCIUM PLUS VITAMIN D3 PO), Take 1 tablet by mouth 3 (three) times daily.  Marland Kitchen  docusate sodium (COLACE) 100 MG capsule, Take 1 capsule (100 mg total) by mouth 3 (three) times daily as needed. Marland Kitchen  escitalopram (LEXAPRO) 10 MG tablet, Take 2 tablets (20 mg total) by mouth daily. Marland Kitchen  gabapentin (NEURONTIN) 100 MG capsule, Take 2 capsules (200 mg total) by mouth at  bedtime. Marland Kitchen  linaclotide (LINZESS) 145 MCG CAPS capsule, Take 1 capsule (145 mcg total) by mouth daily before breakfast. (Patient taking differently: Take 145 mcg by mouth daily as needed (constipation). ) .  Multiple Vitamins-Minerals (MULTIVITAMIN PO), Take 1 tablet by mouth 2 (two) times daily.  .  ondansetron (ZOFRAN ODT) 4 MG disintegrating tablet, Take 1 tablet (4 mg total) by mouth every 8 (eight) hours as needed for nausea or vomiting (May take 2 if one ineffective). .  Vitamin D, Ergocalciferol, (DRISDOL) 50000 units CAPS capsule, TAKE 1 CAPSULE BY MOUTH EVERY 7 DAYS    Past medical history, social, surgical and family  history all reviewed in electronic medical record.  No pertanent information unless stated regarding to the chief complaint.   Review of Systems:  No headache, visual changes, nausea, vomiting, diarrhea, constipation, dizziness, abdominal pain, skin rash, fevers, chills, night sweats, weight loss, swollen lymph nodes, body aches, joint swelling, muscle aches, chest pain, shortness of breath, mood changes.   Objective  There were no vitals taken for this visit. Systems examined below as of    General: No apparent distress alert and oriented x3 mood and affect normal, dressed appropriately.  HEENT: Pupils equal, extraocular movements intact  Respiratory: Patient's speak in full sentences and does not appear short of breath  Cardiovascular: No lower extremity edema, non tender, no erythema  Skin: Warm dry intact with no signs of infection or rash on extremities or on axial skeleton.  Abdomen: Soft nontender  Neuro: Cranial nerves II through XII are intact, neurovascularly intact in all extremities with 2+ DTRs and 2+ pulses.  Lymph: No lymphadenopathy of posterior or anterior cervical chain or axillae bilaterally.  Gait normal with good balance and coordination.  MSK:  Non tender with full range of motion and good stability and symmetric strength and tone of shoulders,  wrist, hip, knee and ankles bilaterally.  Elbow: Unremarkable to inspection. Range of motion full pronation, supination, flexion, extension. Strength is full to all of the above directions Stable to varus, valgus stress. Negative moving valgus stress test. No discrete areas of tenderness to palpation. Ulnar nerve does not sublux. Negative cubital tunnel Tinel's.  Musculoskeletal ultrasound was performed and interpreted by Charlann Boxer D.O.   Elbow:  Lateral epicondyle and common extensor tendon origin visualized.  No edema, effusions, or avulsions seen.  Radial head unremarkable and located in annular ligament Medial epicondyle and common flexor tendon origin visualized.  No edema, effusions, or avulsions seen. Ulnar nerve in cubital tunnel unremarkable. Olecranon and triceps insertion visualized and unremarkable without edema, effusion, or avulsion.  No signs olecranon bursitis. Power doppler signal normal.  IMPRESSION:  NORMAL ULTRASONOGRAPHIC EXAMINATION OF THE ELBOW.    Impression and Recommendations:     This case required medical decision making of moderate complexity. The above documentation has been reviewed and is accurate and complete Lyndal Pulley, DO       Note: This dictation was prepared with Dragon dictation along with smaller phrase technology. Any transcriptional errors that result from this process are unintentional.

## 2018-06-10 MED FILL — OFLOXACIN 0.3% EYE DROPS: 0.3 | 25 days supply | Qty: 5 | Fill #0

## 2018-06-10 MED FILL — PREDNISOLONE AC 1% EYE DROP: 1 | 50 days supply | Qty: 10 | Fill #0

## 2018-06-15 ENCOUNTER — Encounter: Payer: Self-pay | Admitting: Internal Medicine

## 2018-06-17 MED FILL — GABAPENTIN 100 MG CAPSULE: 100 | 30 days supply | Qty: 60 | Fill #1

## 2018-06-19 MED FILL — ESCITALOPRAM 10 MG TABLET: 10 | 30 days supply | Qty: 60 | Fill #2

## 2018-06-24 DIAGNOSIS — H35341 Macular cyst, hole, or pseudohole, right eye: Secondary | ICD-10-CM | POA: Diagnosis not present

## 2018-06-24 DIAGNOSIS — H35371 Puckering of macula, right eye: Secondary | ICD-10-CM | POA: Diagnosis not present

## 2018-07-02 DIAGNOSIS — Z09 Encounter for follow-up examination after completed treatment for conditions other than malignant neoplasm: Secondary | ICD-10-CM | POA: Diagnosis not present

## 2018-07-02 DIAGNOSIS — H35341 Macular cyst, hole, or pseudohole, right eye: Secondary | ICD-10-CM | POA: Diagnosis not present

## 2018-07-23 ENCOUNTER — Other Ambulatory Visit: Payer: Self-pay | Admitting: Internal Medicine

## 2018-07-23 MED FILL — GABAPENTIN 100 MG CAPSULE: 100 | 30 days supply | Qty: 60 | Fill #2

## 2018-07-24 MED FILL — busPIRone HCL 5 MG TABS: 5 | 30 days supply | Qty: 90 | Fill #0

## 2018-08-26 MED FILL — GABAPENTIN 100 MG CAPSULE: 100 | 30 days supply | Qty: 60 | Fill #3

## 2018-09-10 ENCOUNTER — Telehealth: Payer: Self-pay | Admitting: Internal Medicine

## 2018-09-10 ENCOUNTER — Encounter: Payer: Self-pay | Admitting: Family Medicine

## 2018-09-10 ENCOUNTER — Encounter: Payer: Self-pay | Admitting: Internal Medicine

## 2018-09-10 MED ORDER — GLUCOSE BLOOD VI STRP: ORAL_STRIP | 3 refills | Status: DC

## 2018-09-10 NOTE — Telephone Encounter (Addendum)
Copied from [facility] [phone]. Topic: Quick Communication - See Telephone Encounter >> Sep 10, 2018  6:26 PM Ivar Drape wrote: CRM for notification. See Telephone encounter for: 09/10/18. Audrea Muscat w/Link to [facility] w/[facility] [phone] wanted the provider to know that they will be dispensing Free Style Light Meter and Lancets to the patient.

## 2018-09-11 MED ORDER — GLUCOSE BLOOD VI STRP: ORAL_STRIP | 12 refills | Status: DC

## 2018-09-11 NOTE — Telephone Encounter (Signed)
FYI.Marland KitchenMarland KitchenAdded freestyle lite strips & lancets to med list../lmb

## 2018-09-18 MED FILL — FREESTYLE LITE METER: 1 days supply | Qty: 1 | Fill #0

## 2018-09-18 MED FILL — FREESTYLE LITE TEST STRIP: 90 days supply | Qty: 100 | Fill #0

## 2018-09-28 ENCOUNTER — Other Ambulatory Visit: Payer: Self-pay | Admitting: Family Medicine

## 2018-09-28 NOTE — Telephone Encounter (Signed)
Left message for patient to schedule virtual visit.

## 2018-09-30 NOTE — Progress Notes (Signed)
  [name] [address] [place], [place] [address] Phone: [phone] Subjective:    Virtual Visit via Video Note  I connected with Sandra [name] on 10/01/18 at 12:30 PM EDT by a video enabled telemedicine application and verified that I am speaking with the correct person using two identifiers.  Location: Patient: Patient and home setting Provider: I was in office setting   I discussed the limitations of evaluation and management by telemedicine and the availability of in person appointments. The patient expressed understanding and agreed to proceed.  History of Present Illness: Patient is following up on her back and neck pain.  Has responded well to epidurals previously.  Has been doing very well but needs a refill of gabapentin.  States that as long as she takes it she seems to do well.  Able to sleep at night.  Nothing severe.  Does not affect daily activities as long as she takes the medicine.  No side effects to the medicine    Observations/Objective: Alert and oriented x3.  Seems very pleasant on the video conference   Assessment and Plan: Chronic neck and neck pain, patient does have a central disc protrusion from C3-C7 he has no significant impingement.   Follow Up Instructions: As needed    I discussed the assessment and treatment plan with the patient. The patient was provided an opportunity to ask questions and all were answered. The patient agreed with the plan and demonstrated an understanding of the instructions.   The patient was advised to call back or seek an in-person evaluation if the symptoms worsen or if the condition fails to improve as anticipated.  I provided33minutes of -face-to-face time during this encounter.   Lyndal Pulley, DO

## 2018-10-01 ENCOUNTER — Ambulatory Visit (INDEPENDENT_AMBULATORY_CARE_PROVIDER_SITE_OTHER): Payer: 59 | Admitting: Family Medicine

## 2018-10-01 DIAGNOSIS — M5010 Cervical disc disorder with radiculopathy, unspecified cervical region: Secondary | ICD-10-CM | POA: Diagnosis not present

## 2018-10-01 MED ORDER — GABAPENTIN 100 MG PO CAPS: 200.0000 mg | ORAL_CAPSULE | Freq: Every day | ORAL | 3 refills | Status: DC

## 2018-10-01 MED FILL — GABAPENTIN 100 MG CAPSULE: 100 | 90 days supply | Qty: 180 | Fill #0

## 2018-10-07 DIAGNOSIS — Z961 Presence of intraocular lens: Secondary | ICD-10-CM | POA: Diagnosis not present

## 2018-10-07 DIAGNOSIS — H35371 Puckering of macula, right eye: Secondary | ICD-10-CM | POA: Diagnosis not present

## 2018-11-02 ENCOUNTER — Encounter: Payer: 59 | Admitting: Internal Medicine

## 2018-11-10 ENCOUNTER — Ambulatory Visit: Payer: 59 | Admitting: Internal Medicine

## 2018-11-12 DIAGNOSIS — F419 Anxiety disorder, unspecified: Secondary | ICD-10-CM | POA: Insufficient documentation

## 2018-11-12 DIAGNOSIS — F329 Major depressive disorder, single episode, unspecified: Secondary | ICD-10-CM | POA: Insufficient documentation

## 2018-11-12 NOTE — Patient Instructions (Addendum)
  Tests ordered today. Your results will be released to MyChart (or called to you) after review.  If any changes need to be made, you will be notified at that same time.    Medications reviewed and updated.  Changes include :   none     Please followup in 6 months   

## 2018-11-12 NOTE — Progress Notes (Signed)
Subjective:    Patient ID: Sandra [name], female    DOB: January 12, 1961, 58 y.o.   MRN: [id number]  HPI The patient is here for follow up.  She is exercising regularly - swims in her pool, some walking.     Diabetes: She is taking her medication daily as prescribed. She is compliant with a diabetic diet.   She denies numbness/tingling in her feet and foot lesions. She is up-to-date with an ophthalmology examination.    Depression, mild anxiety:  She is taking her medication daily as prescribed. She denies any side effects from the medication. She feels her depression and anxiety are well controlled and she is happy with her current dose of medication.   Hyperlipidemia: She is statin intolerant. She is compliant with a low fat/cholesterol diet.       Medications and allergies reviewed with patient and updated if appropriate.  Patient Active Problem List   Diagnosis Date Noted  . Anxiety and depression 11/12/2018  . Left lateral epicondylitis 05/14/2018  . Left elbow pain 05/04/2018  . Elevated blood pressure reading 09/06/2017  . Right rotator cuff tear 08/20/2017  . Cervical disc disorder with radiculopathy of cervical region 04/25/2016  . Nonallopathic lesion of cervical region 04/17/2016  . Nonallopathic lesion of thoracic region 04/17/2016  . Nonallopathic lesion of rib cage 04/17/2016  . Nonallopathic lesion of lumbosacral region 04/17/2016  . Degenerative cervical disc 03/29/2016  . Degenerative arthritis of right knee 03/29/2016  . 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 Adjustment disorder with mixed anxiety and depressed mood 07/14/2014  . Hx of colonic polyps   . 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 Chronic constipation 03/05/2013  . Coronary atherosclerosis of native coronary artery 08/27/2011  . History of uterine cancer 08/15/2011  . Diabetes ([place]) 08/15/2011  . Hypercholesterolemia 08/15/2011  . History of colonic polyps 12/29/2009     Current Outpatient Medications on File Prior to Visit  Medication Sig Dispense Refill  . buPROPion (WELLBUTRIN XL) 150 MG 24 hr tablet Take 1 tablet (150 mg total) by mouth daily. 30 tablet 5  . busPIRone (BUSPAR) 5 MG tablet TAKE 1 TABLET BY MOUTH 3 TIMES DAILY 90 tablet 0  . Calcium Carb-Cholecalciferol (CALCIUM PLUS VITAMIN D3 PO) Take 1 tablet by mouth 3 (three) times daily.     . Cyanocobalamin (B-12) 1000 MCG SUBL Place 1,000 mcg under the tongue daily.    Marland Kitchen docusate sodium (COLACE) 100 MG capsule Take 1 capsule (100 mg total) by mouth 3 (three) times daily as needed. 20 capsule 0  . escitalopram (LEXAPRO) 10 MG tablet Take 2 tablets (20 mg total) by mouth daily. 60 tablet 3  . gabapentin (NEURONTIN) 100 MG capsule Take 2 capsules (200 mg total) by mouth at bedtime. 180 capsule 3  . glucose blood (FREESTYLE LITE) test strip Use as instructed 100 each 12  . glucose blood test strip Use to check blood sugars daily. 100 each 3  . linaclotide (LINZESS) 145 MCG CAPS capsule Take 1 capsule (145 mcg total) by mouth daily before breakfast. (Patient taking differently: Take 145 mcg by mouth daily as needed (constipation). ) 30 capsule 11  . Multiple Vitamins-Minerals (MULTIVITAMIN PO) Take 1 tablet by mouth 2 (two) times daily.     . ondansetron (ZOFRAN ODT) 4 MG disintegrating tablet Take 1 tablet (4 mg total) by mouth every 8 (eight) hours as needed for nausea or vomiting (May take 2 if one ineffective). 30 tablet 3  .  Vitamin D, Ergocalciferol, (DRISDOL) 50000 units CAPS capsule TAKE 1 CAPSULE BY MOUTH EVERY 7 DAYS 12 capsule 0   No current facility-administered medications on file prior to visit.     Past Medical History:  Diagnosis Date  . Anxiety   . Arthritis   . CAD (coronary artery disease)    nonobstructive CAD in LAD per 2013 CTA coronary morph  . Cancer [facility]) 2006   uterine  . Complication of anesthesia PT STATES HAS LETTER FROM [place] [facility]. STATING THAT SHE HAS A VERY SMALL AIRWAY  (LAST SURG. HYSTERECTOMY AT [facility] IN 2006   PT STATES LYNN BEASON CRNA WHOM WORKS HERE TOLD SHE WOULD BE OKAY TO BE DONE AT [facility]  . Depression   . Diabetes mellitus    no meds taken now, checks cbg once or twice a week   . 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 GERD (gastroesophageal reflux disease)   . Heart murmur    with last visit, MD states "outgrown" murmur  . Hemorrhoids, internal, with bleeding and Grade 2 prolapse 03/05/2013   All positions seen an anoscopy RP columns banded 03/05/2013 LL and RA banded 03/18/2013     . History of endometrial cancer 2006 ---- S/P ABD. HYSTERECTOMY   STAGE I ENDOMETROID CARCINOMA  . Hx of adenomatous colonic polyps 2011   BENIGN  . Hyperlipidemia    no meds now  . Hypertension   . Morbid obesity, weight - 203, BMI - 38.4 05/12/2013  . OSA on CPAP    cpap setting of 16  . Sleep apnea    no CPAP since surgery    Past Surgical History:  Procedure Laterality Date  . ABDOMINAL HYSTERECTOMY  12-20-2004   TAH  . BILATERAL BREAST REDUCTION  11-16-1999  . BREATH TEK H PYLORI N/A 05/27/2013   Procedure: BREATH TEK H PYLORI;  Surgeon: Shann Medal, MD;  Location: [facility];  Service: General;  Laterality: N/A;  . [place]  . COLONOSCOPY    . COLONOSCOPY N/A 04/25/2014   Procedure: COLONOSCOPY;  Surgeon: Gatha Mayer, MD;  Location: [facility];  Service: Endoscopy;  Laterality: N/A;  . CYSTOSCOPY  09/06/2011   Procedure: CYSTOSCOPY;  Surgeon: Ailene Rud, MD;  Location: [facility];  Service: Urology;  Laterality: N/A;  LYNX SLING   . ESOPHAGOGASTRODUODENOSCOPY (EGD) WITH PROPOFOL N/A 09/27/2014   Procedure: ESOPHAGOGASTRODUODENOSCOPY (EGD) WITH PROPOFOL;  Surgeon: Gatha Mayer, MD;  [facility];  Service: Endoscopy;  Laterality: N/A;  . ESOPHAGOGASTRODUODENOSCOPY (EGD) WITH PROPOFOL N/A 03/11/2016   Procedure: ESOPHAGOGASTRODUODENOSCOPY (EGD) WITH PROPOFOL;  Surgeon: Jerene Bears, MD;  [facility];  Service: Endoscopy;  Laterality: N/A;  . GASTRIC BYPASS  10/04/2013  . GASTRIC ROUX-EN-Y N/A 10/04/2013   Procedure: LAPAROSCOPIC ROUX-EN-Y GASTRIC BYPASS WITH UPPER ENDOSCOPY;  Surgeon: Shann Medal, MD;  [facility]: [facility];  Service: General;  Laterality: N/A;  . HEMORRHOID BANDING  02/2013  . PUBOVAGINAL SLING  09/06/2011   Procedure: Gaynelle Arabian;  Surgeon: Ailene Rud, MD;  Location[facility];  Service: Urology;  Laterality: N/A;  . SHOULDER ARTHROSCOPY Right 10/23/2017   Procedure: RIGHT SHOULDER ARTHROSCOPIC ROTATOR CUFF REPAIR VS. DEBRIDEMENT, SUBACROMIAL DECOMPARESSION, DISTAL CLAVICAL EXCISION;  Surgeon: Tania Ade, MD;  Location: [place];  Service: Orthopedics;  Laterality: Right;  . UVULOPALATOPHARYNGOPLASTY, TONSILLECTOMY AND SEPTOPLASTY  1994    Social History   Socioeconomic History  . Marital status: Single  Spouse name: Not on file  . Number of children: Not on file  . Years of education: Not on file  . Highest education level: Not on file  Occupational History  . Not on file  Social Needs  . Financial resource strain: Not on file  . Food insecurity    Worry: Not on file    Inability: Not on file  . Transportation needs    Medical: Not on file    Non-medical: Not on file  Tobacco Use  . Smoking status: Never Smoker  . Smokeless tobacco: Never Used  Substance and Sexual Activity  . Alcohol use: No    Alcohol/week: 0.0 standard drinks  . Drug use: No  . Sexual activity: Not Currently    Birth control/protection: Surgical    Comment: HYST.. 1st intercourse- 13, partners- 6  Lifestyle  . Physical activity    Days per week: Not on file    Minutes per session: Not on file  . Stress: Not on file  Relationships  . Social Herbalist on phone: Not on file    Gets together: Not on file    Attends religious service: Not on file    Active member of club or organization: Not on file     Attends meetings of clubs or organizations: Not on file    Relationship status: Not on file  Other Topics Concern  . 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  Respiratory: Negative for cough, shortness of breath and wheezing.   Cardiovascular: Negative for chest pain, palpitations and leg swelling.  Neurological: Negative for light-headedness and headaches.       Objective:   Vitals:   11/13/18 1420  BP: 140/80  Pulse: 79  Temp: 99.6 F (37.6 C)  SpO2: 98%   BP Readings from Last 3 Encounters:  11/13/18 140/80  05/14/18 128/88  10/23/17 118/78   Wt Readings from Last 3 Encounters:  11/13/18 152 lb 4 oz (69.1 kg)  05/14/18 151 lb (68.5 kg)  10/23/17 154 lb 14.4 oz (70.3 kg)   Body mass index is 27.85 kg/m.   Physical Exam    Constitutional: Appears well-developed and well-nourished. No distress.  HENT:  Head: Normocephalic and atraumatic.  Neck: Neck supple. No tracheal deviation present. No thyromegaly present.  No cervical lymphadenopathy Cardiovascular: Normal rate, regular rhythm and normal heart sounds.   No murmur heard. No carotid bruit .  No edema Pulmonary/Chest: Effort normal and breath sounds normal. No respiratory distress. No has no wheezes. No rales.  Skin: Skin is warm and dry. Not diaphoretic.  Psychiatric: Normal mood and affect. Behavior is normal.      Assessment & Plan:    See Problem List for Assessment and Plan of chronic medical problems.

## 2018-11-13 ENCOUNTER — Other Ambulatory Visit: Payer: Self-pay

## 2018-11-13 ENCOUNTER — Other Ambulatory Visit (INDEPENDENT_AMBULATORY_CARE_PROVIDER_SITE_OTHER): Payer: 59

## 2018-11-13 ENCOUNTER — Encounter: Payer: Self-pay | Admitting: Internal Medicine

## 2018-11-13 ENCOUNTER — Ambulatory Visit (INDEPENDENT_AMBULATORY_CARE_PROVIDER_SITE_OTHER): Payer: 59 | Admitting: Internal Medicine

## 2018-11-13 VITALS — BP 140/80 | HR 79 | Temp 99.6°F | Ht 62.0 in | Wt 152.2 lb

## 2018-11-13 DIAGNOSIS — F419 Anxiety disorder, unspecified: Secondary | ICD-10-CM

## 2018-11-13 DIAGNOSIS — F32A Depression, unspecified: Secondary | ICD-10-CM

## 2018-11-13 DIAGNOSIS — E7800 Pure hypercholesterolemia, unspecified: Secondary | ICD-10-CM

## 2018-11-13 DIAGNOSIS — R8290 Unspecified abnormal findings in urine: Secondary | ICD-10-CM | POA: Diagnosis not present

## 2018-11-13 DIAGNOSIS — F329 Major depressive disorder, single episode, unspecified: Secondary | ICD-10-CM | POA: Diagnosis not present

## 2018-11-13 DIAGNOSIS — E119 Type 2 diabetes mellitus without complications: Secondary | ICD-10-CM

## 2018-11-13 LAB — URINALYSIS, ROUTINE W REFLEX MICROSCOPIC
Bilirubin Urine: NEGATIVE
Ketones, ur: NEGATIVE
Leukocytes,Ua: NEGATIVE
Nitrite: NEGATIVE
Specific Gravity, Urine: 1.025 (ref 1.000–1.030)
Total Protein, Urine: NEGATIVE
Urine Glucose: NEGATIVE
Urobilinogen, UA: 0.2 (ref 0.0–1.0)
pH: 6 (ref 5.0–8.0)

## 2018-11-13 LAB — COMPREHENSIVE METABOLIC PANEL
ALT: 15 U/L (ref 0–35)
AST: 16 U/L (ref 0–37)
Albumin: 4.7 g/dL (ref 3.5–5.2)
Alkaline Phosphatase: 48 U/L (ref 39–117)
BUN: 31 mg/dL — ABNORMAL HIGH (ref 6–23)
CO2: 31 mEq/L (ref 19–32)
Calcium: 9.9 mg/dL (ref 8.4–10.5)
Chloride: 104 mEq/L (ref 96–112)
Creatinine, Ser: 0.62 mg/dL (ref 0.40–1.20)
GFR: 98.53 mL/min (ref >60.00)
Glucose, Bld: 116 mg/dL — ABNORMAL HIGH (ref 70–99)
Potassium: 4.8 mEq/L (ref 3.5–5.1)
Sodium: 141 mEq/L (ref 135–145)
Total Bilirubin: 0.6 mg/dL (ref 0.2–1.2)
Total Protein: 7 g/dL (ref 6.0–8.3)

## 2018-11-13 LAB — LDL CHOLESTEROL, DIRECT: Direct LDL: 95 mg/dL

## 2018-11-13 LAB — MICROALBUMIN / CREATININE URINE RATIO
Creatinine,U: 80.6 mg/dL
Microalb Creat Ratio: 1.2 mg/g (ref 0.0–30.0)
Microalb, Ur: 0.9 mg/dL (ref 0.0–1.9)

## 2018-11-13 LAB — HEMOGLOBIN A1C: Hgb A1c MFr Bld: 6.4 % (ref 4.6–6.5)

## 2018-11-13 LAB — LIPID PANEL
Cholesterol: 154 mg/dL (ref 0–200)
HDL: 41.9 mg/dL (ref >39.00)
NonHDL: 112.18
Total CHOL/HDL Ratio: 4
Triglycerides: 257 mg/dL — ABNORMAL HIGH (ref 0.0–149.0)
VLDL: 51.4 mg/dL — ABNORMAL HIGH (ref 0.0–40.0)

## 2018-11-13 NOTE — Assessment & Plan Note (Addendum)
Check a1c Low sugar / carb diet Stressed regular exercise Has not tolerated statins in the past

## 2018-11-14 NOTE — Assessment & Plan Note (Signed)
Should be on a statin, but has not tolerated statins or zetia in the past Check lipids

## 2018-11-14 NOTE — Assessment & Plan Note (Addendum)
Controlled, stable Continue current dose of medications: Buspar, wellbutrin, lexapro

## 2018-11-15 LAB — URINE CULTURE
MICRO NUMBER:: 678725
SPECIMEN QUALITY:: ADEQUATE

## 2018-11-16 ENCOUNTER — Other Ambulatory Visit: Payer: Self-pay | Admitting: Internal Medicine

## 2018-11-16 ENCOUNTER — Encounter: Payer: Self-pay | Admitting: Internal Medicine

## 2018-11-16 MED ORDER — CEPHALEXIN 500 MG PO CAPS: 500.0000 mg | ORAL_CAPSULE | Freq: Two times a day (BID) | ORAL | 0 refills | Status: DC

## 2018-11-16 MED FILL — CEPHALEXIN 500 MG CAPSULE: 500 | 7 days supply | Qty: 14 | Fill #0

## 2018-12-28 ENCOUNTER — Encounter: Payer: 59 | Admitting: Obstetrics & Gynecology

## 2019-01-25 MED FILL — GABAPENTIN 100 MG CAPSULE: 100 | 90 days supply | Qty: 180 | Fill #1

## 2019-02-16 ENCOUNTER — Other Ambulatory Visit: Payer: Self-pay | Admitting: Internal Medicine

## 2019-02-16 NOTE — Telephone Encounter (Signed)
Patient reports having problems again and is requesting refills.

## 2019-02-17 ENCOUNTER — Other Ambulatory Visit: Payer: Self-pay

## 2019-02-17 ENCOUNTER — Ambulatory Visit (INDEPENDENT_AMBULATORY_CARE_PROVIDER_SITE_OTHER): Payer: 59 | Admitting: Family Medicine

## 2019-02-17 ENCOUNTER — Encounter: Payer: Self-pay | Admitting: Family Medicine

## 2019-02-17 ENCOUNTER — Ambulatory Visit: Payer: Self-pay

## 2019-02-17 VITALS — BP 122/74 | HR 72 | Ht 62.0 in | Wt 155.0 lb

## 2019-02-17 DIAGNOSIS — S46011D Strain of muscle(s) and tendon(s) of the rotator cuff of right shoulder, subsequent encounter: Secondary | ICD-10-CM

## 2019-02-17 DIAGNOSIS — M79641 Pain in right hand: Secondary | ICD-10-CM | POA: Diagnosis not present

## 2019-02-17 DIAGNOSIS — M65321 Trigger finger, right index finger: Secondary | ICD-10-CM | POA: Diagnosis not present

## 2019-02-17 MED ORDER — VITAMIN D (ERGOCALCIFEROL) 1.25 MG (50000 UNIT) PO CAPS: ORAL_CAPSULE | ORAL | 0 refills | Status: DC

## 2019-02-17 MED FILL — SUCRALFATE 1 GM TABLET: 1 | 30 days supply | Qty: 120 | Fill #0

## 2019-02-17 MED FILL — ESOMEPRAZOLE MAG DR 40 MG C: 40 | 90 days supply | Qty: 180 | Fill #0

## 2019-02-17 MED FILL — ONDANSETRON ODT 4 MG TABLET: 4 | 5 days supply | Qty: 30 | Fill #0

## 2019-02-17 MED FILL — VIT D2 1.25 MG (50,000 UNIT: 1.25 MG | 84 days supply | Qty: 12 | Fill #0

## 2019-02-17 NOTE — Assessment & Plan Note (Signed)
Patient given injection, tolerated the procedure well.  We discussed bracing at night, discussed which activities to do which wants to avoid.  Patient will increase activity slowly.

## 2019-02-17 NOTE — Progress Notes (Signed)
[name] [address], [place] [address] Phone: [phone] Subjective:   [name], am serving as a scribe for Dr. Hulan Saas.  I'm seeing this patient by the request  of:    CC: Patient is coming in with right shoulder pain, right hand pain  [id number]   10/01/2018 Chronic neck and neck pain, patient does have a central disc protrusion from C3-C7 he has no significant impingement.  Has had previous epidural on 07/29/2016  Update 02/17/2019 Sandra [name] is a 58 y.o. female coming in with complaint of right shoulder pain. Pain with activity at work. 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  Patient states it is a dull, throbbing aching pain.  Has had difficulties with it in the past and responding well to injection.  Patient denies any radiation down the arm or any numbness or tingling.  History of a rotator cuff tear that needed surgical intervention  Is also having right hand 2nd finger trigger finger especially at night. Feels like the middle finger is also triggering.  Patient states that instantly symptoms did not severe.  Mostly though only in the second finger seems to be the worse at the moment    Past Medical History:  Diagnosis Date  . Anxiety   . Arthritis   . CAD (coronary artery disease)    nonobstructive CAD in LAD per 2013 CTA coronary morph  . Cancer [facility]) 2006   uterine  . Complication of anesthesia PT STATES HAS LETTER FROM [place] [facility]. STATING THAT SHE HAS A VERY SMALL AIRWAY (LAST SURG. HYSTERECTOMY AT [facility] IN 2006   PT STATES LYNN BEASON CRNA WHOM WORKS HERE TOLD SHE WOULD BE OKAY TO BE DONE AT [facility]  . Depression   . Diabetes mellitus    no meds taken now, checks cbg once or twice a week   . 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 GERD (gastroesophageal reflux disease)   . Heart murmur    with last visit, MD states "outgrown" murmur  . Hemorrhoids, internal, with bleeding and Grade  2 prolapse 03/05/2013   All positions seen an anoscopy RP columns banded 03/05/2013 LL and RA banded 03/18/2013     . History of endometrial cancer 2006 ---- S/P ABD. HYSTERECTOMY   STAGE I ENDOMETROID CARCINOMA  . Hx of adenomatous colonic polyps 2011   BENIGN  . Hyperlipidemia    no meds now  . Hypertension   . Morbid obesity, weight - 203, BMI - 38.4 05/12/2013  . OSA on CPAP    cpap setting of 16  . Sleep apnea    no CPAP since surgery   Past Surgical History:  Procedure Laterality Date  . ABDOMINAL HYSTERECTOMY  12-20-2004   TAH  . BILATERAL BREAST REDUCTION  11-16-1999  . BREATH TEK H PYLORI N/A 05/27/2013   Procedure: BREATH TEK H PYLORI;  Surgeon: Shann Medal, MD;  Location: [facility];  Service: General;  Laterality: N/A;  . [place]  . COLONOSCOPY    . COLONOSCOPY N/A 04/25/2014   Procedure: COLONOSCOPY;  Surgeon: Gatha Mayer, MD;  [facility];  Service: Endoscopy;  Laterality: N/A;  . CYSTOSCOPY  09/06/2011   Procedure: CYSTOSCOPY;  Surgeon: Ailene Rud, MD;  Location[facility];  Service: Urology;  Laterality: N/A;  LYNX SLING   . ESOPHAGOGASTRODUODENOSCOPY (EGD) WITH PROPOFOL N/A 09/27/2014   Procedure: ESOPHAGOGASTRODUODENOSCOPY (EGD) WITH PROPOFOL;  Surgeon: Gatha Mayer, MD;  [facility];  Service: Endoscopy;  Laterality: N/A;  . ESOPHAGOGASTRODUODENOSCOPY (EGD) WITH PROPOFOL N/A 03/11/2016   Procedure: ESOPHAGOGASTRODUODENOSCOPY (EGD) WITH PROPOFOL;  Surgeon: Jerene Bears, MD;  [facility];  Service: Endoscopy;  Laterality: N/A;  . GASTRIC BYPASS  10/04/2013  . GASTRIC ROUX-EN-Y N/A 10/04/2013   Procedure: LAPAROSCOPIC ROUX-EN-Y GASTRIC BYPASS WITH UPPER ENDOSCOPY;  Surgeon: Shann Medal, MD;  [facility];  Service: General;  Laterality: N/A;  . HEMORRHOID BANDING  02/2013  . PUBOVAGINAL SLING  09/06/2011   Procedure: Gaynelle Arabian;  Surgeon: Ailene Rud, MD;   Location: [facility];  Service: Urology;  Laterality: N/A;  . SHOULDER ARTHROSCOPY Right 10/23/2017   Procedure: RIGHT SHOULDER ARTHROSCOPIC ROTATOR CUFF REPAIR VS. DEBRIDEMENT, SUBACROMIAL DECOMPARESSION, DISTAL CLAVICAL EXCISION;  Surgeon: Tania Ade, MD;  Location: [place];  Service: Orthopedics;  Laterality: Right;  . UVULOPALATOPHARYNGOPLASTY, TONSILLECTOMY AND SEPTOPLASTY  1994   Social History   Socioeconomic History  . Marital status: Single    Spouse name: Not on file  . Number of children: Not on file  . Years of education: Not on file  . Highest education level: Not on file  Occupational History  . Not on file  Social Needs  . Financial resource strain: Not on file  . Food insecurity    Worry: Not on file    Inability: Not on file  . Transportation needs    Medical: Not on file    Non-medical: Not on file  Tobacco Use  . Smoking status: Never Smoker  . Smokeless tobacco: Never Used  Substance and Sexual Activity  . Alcohol use: No    Alcohol/week: 0.0 standard drinks  . Drug use: No  . Sexual activity: Not Currently    Birth control/protection: Surgical    Comment: HYST.. 1st intercourse- 13, partners- 6  Lifestyle  . Physical activity    Days per week: Not on file    Minutes per session: Not on file  . Stress: Not on file  Relationships  . Social Herbalist on phone: Not on file    Gets together: Not on file    Attends religious service: Not on file    Active member of club or organization: Not on file    Attends meetings of clubs or organizations: Not on file    Relationship status: Not on file  Other Topics Concern  . 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 Nsaids     r/t gastric surgery  . Statins    Family History  Adopted: Yes   Family history unknown: Yes        Current Outpatient Medications (Hematological):  Marland Kitchen  Cyanocobalamin (B-12) 1000 MCG SUBL, Place 1,000 mcg under the tongue daily.  Current Outpatient Medications (Other):  Marland Kitchen  buPROPion (WELLBUTRIN XL) 150 MG 24 hr tablet, Take 1 tablet (150 mg total) by mouth daily. .  busPIRone (BUSPAR) 5 MG tablet, TAKE 1 TABLET BY MOUTH 3 TIMES DAILY .  Calcium Carb-Cholecalciferol (CALCIUM PLUS VITAMIN D3 PO), Take 1 tablet by mouth 3 (three) times daily.  .  cephALEXin (KEFLEX) 500 MG capsule, Take 1 capsule (500 mg total) by mouth 2 (two) times daily. Marland Kitchen  docusate sodium (COLACE)  100 MG capsule, Take 1 capsule (100 mg total) by mouth 3 (three) times daily as needed. Marland Kitchen  escitalopram (LEXAPRO) 10 MG tablet, Take 2 tablets (20 mg total) by mouth daily. Marland Kitchen  gabapentin (NEURONTIN) 100 MG capsule, Take 2 capsules (200 mg total) by mouth at bedtime. Marland Kitchen  glucose blood (FREESTYLE LITE) test strip, Use as instructed .  glucose blood test strip, Use to check blood sugars daily. Marland Kitchen  linaclotide (LINZESS) 145 MCG CAPS capsule, Take 1 capsule (145 mcg total) by mouth daily before breakfast. (Patient taking differently: Take 145 mcg by mouth daily as needed (constipation). ) .  Multiple Vitamins-Minerals (MULTIVITAMIN PO), Take 1 tablet by mouth 2 (two) times daily.  .  ondansetron (ZOFRAN ODT) 4 MG disintegrating tablet, Take 1 tablet (4 mg total) by mouth every 8 (eight) hours as needed for nausea or vomiting (May take 2 if one ineffective). .  Vitamin D, Ergocalciferol, (DRISDOL) 1.25 MG (50000 UT) CAPS capsule, TAKE 1 CAPSULE BY MOUTH EVERY 7 DAYS    Past medical history, social, surgical and family history all reviewed in electronic medical record.  No pertanent information unless stated regarding to the chief complaint.   Review of Systems:  No headache, visual changes, nausea, vomiting, diarrhea, constipation, dizziness, abdominal pain, skin rash, fevers, chills, night  sweats, weight loss, swollen lymph nodes, body aches, joint swelling, muscle aches, chest pain, shortness of breath, mood changes.   Objective  Blood pressure 122/74, pulse 72, height 5\' 2"  (1.575 m), weight 155 lb (70.3 kg), SpO2 98 %.    General: No apparent distress alert and oriented x3 mood and affect normal, dressed appropriately.  HEENT: Pupils equal, extraocular movements intact  Respiratory: Patient's speak in full sentences and does not appear short of breath  Cardiovascular: No lower extremity edema, non tender, no erythema  Skin: Warm dry intact with no signs of infection or rash on extremities or on axial skeleton.  Abdomen: Soft nontender  Neuro: Cranial nerves II through XII are intact, neurovascularly intact in all extremities with 2+ DTRs and 2+ pulses.  Lymph: No lymphadenopathy of posterior or anterior cervical chain or axillae bilaterally.  Gait normal with good balance and coordination.  MSK:  Non tender with full range of motion and good stability and symmetric strength and tone of  elbows, wrist, hip, knee and ankles bilaterally.  Shoulder: Right Inspection reveals no abnormalities, atrophy or asymmetry. Palpation is normal with no tenderness over AC joint or bicipital groove. ROM is full in all planes passively. Rotator cuff strength normal throughout. signs of impingement with positive Neer and Hawkin's tests, but negative empty can sign. Speeds and Yergason's tests normal. No labral pathology noted with negative Obrien's, negative clunk and good stability. Normal scapular function observed. No painful arc and no drop arm sign. No apprehension sign Anterolateral shoulder unremarkable  Right hand exam shows the patient does have a triggering of the A1 pulley of the index finger.  Severely tender to palpation.  Patient does have full range of motion though noted.  Mild triggering noted with flexion and extension  MSK US performed of: Right This study was  ordered, performed, and interpreted by Charlann Boxer D.O.  Shoulder:   Supraspinatus:  Appears normal on long and transverse views, Bursal bulge seen with shoulder abduction on impingement view. Infraspinatus:  Appears normal on long and transverse views. Significant increase in Doppler flow Subscapularis:  Appears normal on long and transverse views. Positive bursa Teres Minor:  Appears normal on  long and transverse views. AC joint:  Capsule undistended, no geyser sign. Glenohumeral Joint:  Appears normal without effusion. Glenoid Labrum:  Intact without visualized tears. Biceps Tendon:  Appears normal on long and transverse views, no fraying of tendon, tendon located in intertubercular groove, no subluxation with shoulder internal or external rotation.  Impression: Subacromial bursitis  Procedure: Real-time Ultrasound Guided Injection of right glenohumeral joint Device: GE Logiq E  Ultrasound guided injection is preferred based studies that show increased duration, increased effect, greater accuracy, decreased procedural pain, increased response rate with ultrasound guided versus blind injection.  Verbal informed consent obtained.  Time-out conducted.  Noted no overlying erythema, induration, or other signs of local infection.  Skin prepped in a sterile fashion.  Local anesthesia: Topical Ethyl chloride.  With sterile technique and under real time ultrasound guidance:  Joint visualized.  23g 1  inch needle inserted posterior approach. Pictures taken for needle placement. Patient did have injection of 2 cc of 1% lidocaine, 2 cc of 0.5% Marcaine, and 1.0 cc of Kenalog 40 mg/dL. Completed without difficulty  Pain immediately resolved suggesting accurate placement of the medication.  Advised to call if fevers/chills, erythema, induration, drainage, or persistent bleeding.  Images permanently stored and available for review in the ultrasound unit.  Impression: Technically successful ultrasound  guided injection.  Procedure: Real-time Ultrasound Guided Injection of right flexor tendon sheath second finger Device: GE Logiq Q7 Ultrasound guided injection is preferred based studies that show increased duration, increased effect, greater accuracy, decreased procedural pain, increased response rate, and decreased cost with ultrasound guided versus blind injection.  Verbal informed consent obtained.  Time-out conducted.  Noted no overlying erythema, induration, or other signs of local infection.  Skin prepped in a sterile fashion.  Local anesthesia: Topical Ethyl chloride.  With sterile technique and under real time ultrasound guidance: With a 25-gauge half inch needle injected with 0.5 cc of 0.5% Marcaine and 0.5 cc of Kenalog 40 mg/mL Completed without difficulty  Pain immediately resolved suggesting accurate placement of the medication.  Advised to call if fevers/chills, erythema, induration, drainage, or persistent bleeding.  Images permanently stored and available for review in the ultrasound unit.  Impression: Technically successful ultrasound guided injection.   Impression and Recommendations:     This case required medical decision making of moderate complexity. The above documentation has been reviewed and is accurate and complete Lyndal Pulley, DO       Note: This dictation was prepared with Dragon dictation along with smaller phrase technology. Any transcriptional errors that result from this process are unintentional.

## 2019-02-17 NOTE — Patient Instructions (Addendum)
Good to see you  Ice is your friend Injected the trigger finger and the shoulder  Once weekly vitamin D  See me again in 6-7 weeks

## 2019-02-17 NOTE — Assessment & Plan Note (Signed)
Steroid injection given today.  Seem to be more of a subacromial bursitis than anterior tear.  No significant weakness.  Discussed icing regimen.  Discussed home exercise, discussed avoiding certain activities.  Patient will follow-up again in 4 to 8 weeks

## 2019-03-04 ENCOUNTER — Other Ambulatory Visit: Payer: Self-pay

## 2019-03-05 ENCOUNTER — Ambulatory Visit (INDEPENDENT_AMBULATORY_CARE_PROVIDER_SITE_OTHER): Payer: 59 | Admitting: Obstetrics & Gynecology

## 2019-03-05 ENCOUNTER — Encounter: Payer: Self-pay | Admitting: Obstetrics & Gynecology

## 2019-03-05 VITALS — BP 124/72 | Ht 62.0 in | Wt 150.0 lb

## 2019-03-05 DIAGNOSIS — Z8542 Personal history of malignant neoplasm of other parts of uterus: Secondary | ICD-10-CM | POA: Diagnosis not present

## 2019-03-05 DIAGNOSIS — Z113 Encounter for screening for infections with a predominantly sexual mode of transmission: Secondary | ICD-10-CM | POA: Diagnosis not present

## 2019-03-05 DIAGNOSIS — Z90710 Acquired absence of both cervix and uterus: Secondary | ICD-10-CM | POA: Diagnosis not present

## 2019-03-05 DIAGNOSIS — Z1272 Encounter for screening for malignant neoplasm of vagina: Secondary | ICD-10-CM

## 2019-03-05 DIAGNOSIS — N6325 Unspecified lump in the left breast, overlapping quadrants: Secondary | ICD-10-CM

## 2019-03-05 DIAGNOSIS — Z780 Asymptomatic menopausal state: Secondary | ICD-10-CM | POA: Diagnosis not present

## 2019-03-05 DIAGNOSIS — Z01419 Encounter for gynecological examination (general) (routine) without abnormal findings: Secondary | ICD-10-CM

## 2019-03-05 DIAGNOSIS — N898 Other specified noninflammatory disorders of vagina: Secondary | ICD-10-CM | POA: Diagnosis not present

## 2019-03-05 LAB — WET PREP FOR TRICH, YEAST, CLUE

## 2019-03-05 NOTE — Progress Notes (Signed)
Sandra [name] [birth year] [id number]   History:    58 y.o. G3P2A1L2 Divorced.  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  Preserved her ovaries.  Menopause, well on no HRT.  Pap tests negative every year.  Last Pelvic US negative in 2017.  No pelvic pain.  C/O vaginal odor.  Urine and bowel movements normal.  Abstinent x 1 year.  Would like STI screen.  Breasts wnl.  BMI 27.44.  Health labs with Fam MD.  Past medical history,surgical history, family history and social history were all reviewed and documented in the EPIC chart.  Gynecologic History No LMP recorded. Patient has had a hysterectomy. Contraception: status post hysterectomy Last Pap: 11/2017. Results were: Negative Last mammogram: 09/2015. Results were: Negative Bone Density: Never, will schedule at age 25. Colonoscopy: Scheduling 04/2019  Obstetric History OB History  Gravida Para Term Preterm AB Living  3 2     1 2   SAB TAB Ectopic Multiple Live Births  1            # Outcome Date GA Lbr Len/2nd Weight Sex Delivery Anes PTL Lv  3 SAB           2 Para           1 Para              ROS: A ROS was performed and pertinent positives and negatives are included in the history.  GENERAL: No fevers or chills. HEENT: No change in vision, no earache, sore throat or sinus congestion. NECK: No pain or stiffness. CARDIOVASCULAR: No chest pain or pressure. No palpitations. PULMONARY: No shortness of breath, cough or wheeze. GASTROINTESTINAL: No abdominal pain, nausea, vomiting or diarrhea, melena or bright red blood per rectum. GENITOURINARY: No urinary frequency, urgency, hesitancy or dysuria. MUSCULOSKELETAL: No joint or muscle pain, no back pain, no recent trauma. DERMATOLOGIC: No rash, no itching, no lesions. ENDOCRINE: No polyuria, polydipsia, no heat or cold intolerance. No recent change in  weight. HEMATOLOGICAL: No anemia or easy bruising or bleeding. NEUROLOGIC: No headache, seizures, numbness, tingling or weakness. PSYCHIATRIC: No depression, no loss of interest in normal activity or change in sleep pattern.     Exam:   BP 124/72   Ht 5\' 2"  (1.575 m)   Wt 150 lb (68 kg)   BMI 27.44 kg/m   Body mass index is 27.44 kg/m.  General appearance : Well developed well nourished female. No acute distress HEENT: Eyes: no retinal hemorrhage or exudates,  Neck supple, trachea midline, no carotid bruits, no thyroidmegaly Lungs: Clear to auscultation, no rhonchi or wheezes, or rib retractions  Heart: Regular rate and rhythm, no murmurs or gallops Breast:Examined in sitting and supine position were symmetrical in appearance, s/p bilateral breast reduction.  Rt breast: no palpable masses or tenderness,  no skin retraction, no nipple inversion, no nipple discharge, no skin discoloration, no axillary or supraclavicular lymphadenopathy.  Lt breast with a nodule 2 x 1.5 cm at 6 O'clock, close to surgical scars. No Lt breast tenderness,  no skin retraction, no nipple inversion, no nipple discharge, no skin discoloration, no axillary or supraclavicular lymphadenopathy. Abdomen: no palpable masses or tenderness, no rebound or guarding Extremities: no edema or skin discoloration or tenderness  Pelvic: Vulva: Normal             Vagina: No gross lesions or discharge.  Pap reflex/Gono-Chlam done.  Wet prep done.  Cervix/Uterus absent.  Adnexa  Without masses or tenderness  Anus: Normal  Wet prep: Negative   Assessment/Plan:  58 y.o. female for annual exam   1. Encounter for Papanicolaou smear of vagina as part of routine gynecological examination Gynecologic exam status post total hysterectomy and menopause.  History of endometrial cancer, Pap reflex done.  Breast exam with a left breast lump.  Colonoscopy to be scheduled in January 2021.  Health labs with family physician.  2. S/P TAH  (total abdominal hysterectomy)  3. 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  Ovaries were preserved.  4. Postmenopause Well on no hormone replacement therapy.  Will schedule a bone density at age 57.  Recommend vitamin D supplements, calcium intake of 1200 mg daily and regular weightbearing physical activities.  5. Breast lump on left side at 6 o'clock position Breast lump at 6:00 on the left breast close to the scars from her bilateral breast reduction.  The nodule measures 2 x 1.5 cm and is nontender.  We will do a left diagnostic mammogram with ultrasound and a right screening mammogram.  6. Screen for STD (sexually transmitted disease) -Gonorrhea and chlamydia on Pap test - HIV antibody (with reflex) - RPR - Hepatitis C Antibody - Hepatitis B Surface AntiGEN  7. Vaginal odor Wet prep negative.  Will use probiotic tablet vaginally once a week as needed. - WET PREP FOR [place], YEAST, CLUE  Princess Bruins MD, 8:31 AM 03/05/2019

## 2019-03-06 ENCOUNTER — Encounter: Payer: Self-pay | Admitting: Obstetrics & Gynecology

## 2019-03-06 NOTE — Patient Instructions (Addendum)
1. Encounter for Papanicolaou smear of vagina as part of routine gynecological examination Gynecologic exam status post total hysterectomy and menopause.  History of endometrial cancer, Pap reflex done.  Breast exam with a left breast lump.  Colonoscopy to be scheduled in January 2021.  Health labs with family physician.  2. S/P TAH (total abdominal hysterectomy)  3. 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  Ovaries were preserved.  4. Postmenopause Well on no hormone replacement therapy.  Will schedule a bone density at age 18.  Recommend vitamin D supplements, calcium intake of 1200 mg daily and regular weightbearing physical activities.  5. Breast lump on left side at 6 o'clock position Breast lump at 6:00 on the left breast close to the scars from her bilateral breast reduction.  The nodule measures 2 x 1.5 cm and is nontender.  We will do a left diagnostic mammogram with ultrasound and a right screening mammogram.  6. Screen for STD (sexually transmitted disease) -Gonorrhea and chlamydia on Pap test - HIV antibody (with reflex) - RPR - Hepatitis C Antibody - Hepatitis B Surface AntiGEN  7. Vaginal odor Wet prep negative.  Will use probiotic tablet vaginally once a week as needed. - WET PREP FOR [place], YEAST, CLUE  Leon[place]ne, it was a pleasure seeing you today!  I will inform you of your results as soon as they are available.

## 2019-03-08 LAB — HEPATITIS C ANTIBODY
Hepatitis C Ab: NONREACTIVE
SIGNAL TO CUT-OFF: 0.01 (ref <1.00)

## 2019-03-08 LAB — HEPATITIS B SURFACE ANTIGEN: Hepatitis B Surface Ag: NONREACTIVE

## 2019-03-08 LAB — HIV ANTIBODY (ROUTINE TESTING W REFLEX): HIV 1&2 Ab, 4th Generation: NONREACTIVE

## 2019-03-08 LAB — RPR: RPR Ser Ql: NONREACTIVE

## 2019-03-09 LAB — PAP THINPREP ASCUS RFLX HPV RFLX TYPE
C. trachomatis RNA, TMA: NOT DETECTED
N. gonorrhoeae RNA, TMA: NOT DETECTED

## 2019-03-10 ENCOUNTER — Telehealth: Payer: Self-pay | Admitting: *Deleted

## 2019-03-10 ENCOUNTER — Other Ambulatory Visit: Payer: Self-pay | Admitting: Obstetrics & Gynecology

## 2019-03-10 DIAGNOSIS — N6320 Unspecified lump in the left breast, unspecified quadrant: Secondary | ICD-10-CM

## 2019-03-10 NOTE — Telephone Encounter (Signed)
Per Dr Dellis Filbert schedule Dx left brst mammo and screening right and left brst [place] if needed due to left breast mass 6 oclock 2x1.5cm.  Apt at [facility] 11/23 840am arrive 820.  Left message on patient's cell VM. Sharrie Rothman CMA

## 2019-03-22 ENCOUNTER — Other Ambulatory Visit: Payer: 59

## 2019-03-31 ENCOUNTER — Ambulatory Visit
Admission: RE | Admit: 2019-03-31 | Discharge: 2019-03-31 | Disposition: A | Discharge status: Home / Self Care | Payer: 59 | Source: Ambulatory Visit | Attending: Obstetrics & Gynecology | Admitting: Obstetrics & Gynecology

## 2019-03-31 ENCOUNTER — Ambulatory Visit: Payer: 59 | Admitting: Family Medicine

## 2019-03-31 ENCOUNTER — Telehealth: Payer: Self-pay

## 2019-03-31 ENCOUNTER — Other Ambulatory Visit: Payer: Self-pay

## 2019-03-31 DIAGNOSIS — N6489 Other specified disorders of breast: Secondary | ICD-10-CM | POA: Diagnosis not present

## 2019-03-31 DIAGNOSIS — N6320 Unspecified lump in the left breast, unspecified quadrant: Secondary | ICD-10-CM

## 2019-03-31 DIAGNOSIS — R928 Other abnormal and inconclusive findings on diagnostic imaging of breast: Secondary | ICD-10-CM | POA: Diagnosis not present

## 2019-03-31 NOTE — Telephone Encounter (Signed)
Canceled patient appointment for today at 11:30.

## 2019-04-19 MED FILL — GABAPENTIN 100 MG CAPSULE: 100 | 90 days supply | Qty: 180 | Fill #2

## 2019-04-30 ENCOUNTER — Ambulatory Visit (INDEPENDENT_AMBULATORY_CARE_PROVIDER_SITE_OTHER)
Admission: RE | Admit: 2019-04-30 | Discharge: 2019-04-30 | Disposition: A | Discharge status: Home / Self Care | Payer: 59 | Source: Ambulatory Visit

## 2019-04-30 DIAGNOSIS — M25551 Pain in right hip: Secondary | ICD-10-CM

## 2019-04-30 DIAGNOSIS — W108XXA Fall (on) (from) other stairs and steps, initial encounter: Secondary | ICD-10-CM | POA: Diagnosis not present

## 2019-04-30 DIAGNOSIS — M79651 Pain in right thigh: Secondary | ICD-10-CM | POA: Diagnosis not present

## 2019-04-30 NOTE — ED Provider Notes (Signed)
Virtual Visit via Video Note:  Sandra [name]  initiated request for Telemedicine visit with [facility] team. I connected with Sandra [name]  on 04/30/2019 at 6:09 PM  for a synchronized telemedicine visit using a video enabled HIPPA compliant telemedicine application. I verified that I am speaking with Sandra [name]  using two identifiers. Sandra Eagles, Sandra [name]  was physically located in a [facility] Urgent care site and Sandra [name] was located at a different location.   The limitations of evaluation and management by telemedicine as well as the availability of in-person appointments were discussed. Patient was informed that she  may incur a bill ( including co-pay) for this virtual visit encounter. Sandra [name]  expressed understanding and gave verbal consent to proceed with virtual visit.     History of Present Illness:Sandra [name]  is a 59 y.o. female presents with posterior hip, leg pain from falling down some steps (a total of 3) earlier today.  Patient states that she landed at the edge of the step and had a knot developed thereafter.  She had moderate to severe pain that has since calm down significantly from taking Tylenol.  She is able to bear weight on her leg and move around some.  Feels pain when she applies pressure to the area only.  ROS  No current facility-administered medications for this encounter.   Current Outpatient Medications  Medication Sig Dispense Refill  . buPROPion (WELLBUTRIN XL) 150 MG 24 hr tablet Take 1 tablet (150 mg total) by mouth daily. 30 tablet 5  . busPIRone (BUSPAR) 5 MG tablet TAKE 1 TABLET BY MOUTH 3 TIMES DAILY 90 tablet 0  . Calcium Carb-Cholecalciferol (CALCIUM PLUS VITAMIN D3 PO) Take 1 tablet by mouth 3 (three) times daily.     . cephALEXin (KEFLEX) 500 MG capsule Take 1 capsule (500 mg total) by mouth 2 (two) times daily. 14 capsule 0  . Cyanocobalamin (B-12) 1000 MCG SUBL Place 1,000 mcg under the  tongue daily.    Marland Kitchen docusate sodium (COLACE) 100 MG capsule Take 1 capsule (100 mg total) by mouth 3 (three) times daily as needed. 20 capsule 0  . esomeprazole (NEXIUM) 40 MG capsule TAKE 1 CAPSULE BY MOUTH TWICE DAILY BEFORE A MEAL 180 capsule 3  . gabapentin (NEURONTIN) 100 MG capsule Take 2 capsules (200 mg total) by mouth at bedtime. 180 capsule 3  . glucose blood (FREESTYLE LITE) test strip Use as instructed 100 each 12  . glucose blood test strip Use to check blood sugars daily. 100 each 3  . linaclotide (LINZESS) 145 MCG CAPS capsule Take 1 capsule (145 mcg total) by mouth daily before breakfast. (Patient taking differently: Take 145 mcg by mouth daily as needed (constipation). ) 30 capsule 11  . Multiple Vitamins-Minerals (MULTIVITAMIN PO) Take 1 tablet by mouth 2 (two) times daily.     . ondansetron (ZOFRAN-ODT) 4 MG disintegrating tablet DISSOLVE 1 TABLET BY MOUTH EVERY 8 HOURS AS NEEDED FOR NAUSEA OR VOMITING (MAY TAKE 2 FOR ONE INEFFECTIVE) 30 tablet 3  . sucralfate (CARAFATE) 1 g tablet TAKE 1 TABLET BY MOUTH 4 TIMES DAILY (WITH MEALS AND AT BEDTIME) 120 tablet 11  . Vitamin D, Ergocalciferol, (DRISDOL) 1.25 MG (50000 UT) CAPS capsule TAKE 1 CAPSULE BY MOUTH EVERY 7 DAYS 12 capsule 0     Allergies  Allergen Reactions  . Nsaids     r/t gastric surgery  . Statins  Past Medical History:  Diagnosis Date  . Anxiety   . Arthritis   . CAD (coronary artery disease)    nonobstructive CAD in LAD per 2013 CTA coronary morph  . Cancer [facility]) 2006   uterine  . Complication of anesthesia PT STATES HAS LETTER FROM [place] [facility]. STATING THAT SHE HAS A VERY SMALL AIRWAY (LAST SURG. HYSTERECTOMY AT [facility] IN 2006   PT STATES LYNN BEASON CRNA WHOM WORKS HERE TOLD SHE WOULD BE OKAY TO BE DONE AT [facility]  . Depression   . Diabetes mellitus    no meds taken now, checks cbg once or twice a week   . 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 GERD (gastroesophageal reflux disease)   .  Heart murmur    with last visit, MD states "outgrown" murmur  . Hemorrhoids, internal, with bleeding and Grade 2 prolapse 03/05/2013   All positions seen an anoscopy RP columns banded 03/05/2013 LL and RA banded 03/18/2013     . History of endometrial cancer 2006 ---- S/P ABD. HYSTERECTOMY   STAGE I ENDOMETROID CARCINOMA  . Hx of adenomatous colonic polyps 2011   BENIGN  . Hyperlipidemia    no meds now  . Hypertension   . Morbid obesity, weight - 203, BMI - 38.4 05/12/2013  . OSA on CPAP    cpap setting of 16  . Sleep apnea    no CPAP since surgery    Past Surgical History:  Procedure Laterality Date  . ABDOMINAL HYSTERECTOMY  12-20-2004   TAH  . BILATERAL BREAST REDUCTION  11-16-1999  . BREATH TEK H PYLORI N/A 05/27/2013   Procedure: BREATH TEK H PYLORI;  Surgeon: Shann Medal, MD;  Location: [facility];  Service: General;  Laterality: N/A;  . [place]  . COLONOSCOPY    . COLONOSCOPY N/A 04/25/2014   Procedure: COLONOSCOPY;  Surgeon: Gatha Mayer, MD;  Location: [facility];  Service: Endoscopy;  Laterality: N/A;  . CYSTOSCOPY  09/06/2011   Procedure: CYSTOSCOPY;  Surgeon: Ailene Rud, MD;  Location[facility];  Service: Urology;  Laterality: N/A;  LYNX SLING   . ESOPHAGOGASTRODUODENOSCOPY (EGD) WITH PROPOFOL N/A 09/27/2014   Procedure: ESOPHAGOGASTRODUODENOSCOPY (EGD) WITH PROPOFOL;  Surgeon: Gatha Mayer, MD;  Location: [facility];  Service: Endoscopy;  Laterality: N/A;  . ESOPHAGOGASTRODUODENOSCOPY (EGD) WITH PROPOFOL N/A 03/11/2016   Procedure: ESOPHAGOGASTRODUODENOSCOPY (EGD) WITH PROPOFOL;  Surgeon: Jerene Bears, MD;  Location: [facility];  Service: Endoscopy;  Laterality: N/A;  . GASTRIC BYPASS  10/04/2013  . GASTRIC ROUX-EN-Y N/A 10/04/2013   Procedure: LAPAROSCOPIC ROUX-EN-Y GASTRIC BYPASS WITH UPPER ENDOSCOPY;  Surgeon: Shann Medal, MD;  Location: [facility];  Service: General;  Laterality: N/A;  . HEMORRHOID BANDING   02/2013  . PUBOVAGINAL SLING  09/06/2011   Procedure: Gaynelle Arabian;  Surgeon: Ailene Rud, MD;  Location[facility];  Service: Urology;  Laterality: N/A;  . REDUCTION MAMMAPLASTY Bilateral 2001  . SHOULDER ARTHROSCOPY Right 10/23/2017   Procedure: RIGHT SHOULDER ARTHROSCOPIC ROTATOR CUFF REPAIR VS. DEBRIDEMENT, SUBACROMIAL DECOMPARESSION, DISTAL CLAVICAL EXCISION;  Surgeon: Tania Ade, MD;  Location: [place];  Service: Orthopedics;  Laterality: Right;  . UVULOPALATOPHARYNGOPLASTY, TONSILLECTOMY AND SEPTOPLASTY  1994      Observations/Objective: Physical Exam Constitutional:      General: She is not in acute distress.    Appearance: Normal appearance. She is well-developed. She is not ill-appearing, toxic-appearing or diaphoretic.  Eyes:  Extraocular Movements: Extraocular movements intact.  Pulmonary:     Effort: Pulmonary effort is normal.  Neurological:     General: No focal deficit present.     Mental Status: She is alert and oriented to person, place, and time.  Psychiatric:        Mood and Affect: Mood normal.        Behavior: Behavior normal.        Thought Content: Thought content normal.        Judgment: Judgment normal.      Assessment and Plan:  1. Right thigh pain   2. Right hip pain   3. Fall on steps, initial encounter    Recommended watchful waiting, use icing and Tylenol.  Discussed signs and symptoms warranting x-ray and an in person office visit. Counseled patient on potential for adverse effects with medications prescribed/recommended today, ER and return-to-clinic precautions discussed, patient verbalized understanding.    Follow Up Instructions:    I discussed the assessment and treatment plan with the patient. The patient was provided an opportunity to ask questions and all were answered. The patient agreed with the plan and demonstrated an understanding of the instructions.   The patient was advised to call back  or seek an in-person evaluation if the symptoms worsen or if the condition fails to improve as anticipated.  I provided 5 minutes of non-face-to-face time during this encounter.    Sandra Eagles, Sandra [name]  04/30/2019 6:09 PM       Sandra Eagles, Sandra [name] 04/30/19 1817

## 2019-05-05 ENCOUNTER — Encounter: Payer: Self-pay | Admitting: Family Medicine

## 2019-05-07 ENCOUNTER — Ambulatory Visit (INDEPENDENT_AMBULATORY_CARE_PROVIDER_SITE_OTHER): Payer: 59 | Admitting: Family Medicine

## 2019-05-07 ENCOUNTER — Ambulatory Visit (INDEPENDENT_AMBULATORY_CARE_PROVIDER_SITE_OTHER): Payer: 59

## 2019-05-07 ENCOUNTER — Encounter: Payer: Self-pay | Admitting: Family Medicine

## 2019-05-07 ENCOUNTER — Other Ambulatory Visit: Payer: Self-pay

## 2019-05-07 VITALS — BP 126/86 | HR 87 | Ht 62.0 in | Wt 158.0 lb

## 2019-05-07 DIAGNOSIS — M545 Low back pain, unspecified: Secondary | ICD-10-CM

## 2019-05-07 DIAGNOSIS — S7001XA Contusion of right hip, initial encounter: Secondary | ICD-10-CM

## 2019-05-07 DIAGNOSIS — S3992XA Unspecified injury of lower back, initial encounter: Secondary | ICD-10-CM | POA: Diagnosis not present

## 2019-05-07 DIAGNOSIS — M25551 Pain in right hip: Secondary | ICD-10-CM

## 2019-05-07 DIAGNOSIS — S79911A Unspecified injury of right hip, initial encounter: Secondary | ICD-10-CM | POA: Diagnosis not present

## 2019-05-07 DIAGNOSIS — M2550 Pain in unspecified joint: Secondary | ICD-10-CM

## 2019-05-07 MED ORDER — KETOROLAC TROMETHAMINE 60 MG/2ML IM SOLN
60.0000 mg | Freq: Once | INTRAMUSCULAR | Status: AC
  Administered 2019-05-07: 60 mg via INTRAMUSCULAR

## 2019-05-07 MED ORDER — METHYLPREDNISOLONE ACETATE 80 MG/ML IJ SUSP
80.0000 mg | Freq: Once | INTRAMUSCULAR | Status: AC
  Administered 2019-05-07: 80 mg via INTRAMUSCULAR

## 2019-05-07 NOTE — Assessment & Plan Note (Signed)
Hematoma noted, will get x-rays to rule out any type of greater trochanteric fracture.  Patient should relatively do well with conservative therapy.  Discussed icing regimen and home exercises.  Discussed heat and warm compresses over-the-counter gels, follow-up again in 3 weeks to restart manipulation

## 2019-05-07 NOTE — Patient Instructions (Addendum)
Arnica lotion Warm compression with massage Tramadol up to 2x a day with Tylenol as needed See me again in 3 weeks

## 2019-05-07 NOTE — Progress Notes (Signed)
[place] Phone: [phone] Subjective:   [name], am serving as a scribe for Dr. Hulan Saas. This visit occurred during the SARS-CoV-2 public health emergency.  Safety protocols were in place, including screening questions prior to the visit, additional usage of staff PPE, and extensive cleaning of exam room while observing appropriate contact time as indicated for disinfecting solutions.      CC: Right hip pain after fall  [id number]  Sandra [name] is a 59 y.o. female coming in with complaint of right hip pain following a fall on the steps. Landed on the right hip and right shoulder pain. Feels a knot in the gluteus medius. Also having pain in the SI joint with left sided numbness. Fell on 04/30/2019.  Patient states is continuing to give her pain.  Difficult to sit or even lay down on that side.  Rates the severity of pain is 8 out of 10.  Patient denies any radiation of pain, any numbness or tingling.     Past Medical History:  Diagnosis Date  . Anxiety   . Arthritis   . CAD (coronary artery disease)    nonobstructive CAD in LAD per 2013 CTA coronary morph  . Cancer [facility]) 2006   uterine  . Complication of anesthesia PT STATES HAS LETTER FROM [place] [facility]. STATING THAT SHE HAS A VERY SMALL AIRWAY (LAST SURG. HYSTERECTOMY AT [facility] IN 2006   PT STATES LYNN BEASON CRNA WHOM WORKS HERE TOLD SHE WOULD BE OKAY TO BE DONE AT [facility]  . Depression   . Diabetes mellitus    no meds taken now, checks cbg once or twice a week   . 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 GERD (gastroesophageal reflux disease)   . Heart murmur    with last visit, MD states "outgrown" murmur  . Hemorrhoids, internal, with bleeding and Grade 2 prolapse 03/05/2013   All positions seen an anoscopy RP columns banded 03/05/2013 LL and RA banded 03/18/2013     . History of endometrial cancer 2006 ---- S/P ABD. HYSTERECTOMY    STAGE I ENDOMETROID CARCINOMA  . Hx of adenomatous colonic polyps 2011   BENIGN  . Hyperlipidemia    no meds now  . Hypertension   . Morbid obesity, weight - 203, BMI - 38.4 05/12/2013  . OSA on CPAP    cpap setting of 16  . Sleep apnea    no CPAP since surgery   Past Surgical History:  Procedure Laterality Date  . ABDOMINAL HYSTERECTOMY  12-20-2004   TAH  . BILATERAL BREAST REDUCTION  11-16-1999  . BREATH TEK H PYLORI N/A 05/27/2013   Procedure: BREATH TEK H PYLORI;  Surgeon: Shann Medal, MD;  Location: [facility];  Service: General;  Laterality: N/A;  . [place]  . COLONOSCOPY    . COLONOSCOPY N/A 04/25/2014   Procedure: COLONOSCOPY;  Surgeon: Gatha Mayer, MD;  Location: [facility];  Service: Endoscopy;  Laterality: N/A;  . CYSTOSCOPY  09/06/2011   Procedure: CYSTOSCOPY;  Surgeon: Ailene Rud, MD;  Location[facility];  Service: Urology;  Laterality: N/A;  LYNX SLING   . ESOPHAGOGASTRODUODENOSCOPY (EGD) WITH PROPOFOL N/A 09/27/2014   Procedure: ESOPHAGOGASTRODUODENOSCOPY (EGD) WITH PROPOFOL;  Surgeon: Gatha Mayer, MD;  [facility];  Service: Endoscopy;  Laterality: N/A;  . ESOPHAGOGASTRODUODENOSCOPY (EGD) WITH PROPOFOL N/A 03/11/2016   Procedure: ESOPHAGOGASTRODUODENOSCOPY (EGD) WITH PROPOFOL;  Surgeon: Jerene Bears, MD;  Location: [facility];  Service: Endoscopy;  Laterality: N/A;  . GASTRIC BYPASS  10/04/2013  . GASTRIC ROUX-EN-Y N/A 10/04/2013   Procedure: LAPAROSCOPIC ROUX-EN-Y GASTRIC BYPASS WITH UPPER ENDOSCOPY;  Surgeon: Shann Medal, MD;  [facility];  Service: General;  Laterality: N/A;  . HEMORRHOID BANDING  02/2013  . PUBOVAGINAL SLING  09/06/2011   Procedure: Gaynelle Arabian;  Surgeon: Ailene Rud, MD;  Location[facility];  Service: Urology;  Laterality: N/A;  . REDUCTION MAMMAPLASTY Bilateral 2001  . SHOULDER ARTHROSCOPY Right 10/23/2017   Procedure: RIGHT SHOULDER  ARTHROSCOPIC ROTATOR CUFF REPAIR VS. DEBRIDEMENT, SUBACROMIAL DECOMPARESSION, DISTAL CLAVICAL EXCISION;  Surgeon: Tania Ade, MD;  Location: [place];  Service: Orthopedics;  Laterality: Right;  . UVULOPALATOPHARYNGOPLASTY, TONSILLECTOMY AND SEPTOPLASTY  1994   Social History   Socioeconomic History  . Marital status: Single    Spouse name: Not on file  . Number of children: Not on file  . Years of education: Not on file  . Highest education level: Not on file  Occupational History  . Not on file  Tobacco Use  . Smoking status: Never Smoker  . Smokeless tobacco: Never Used  Substance and Sexual Activity  . Alcohol use: No    Alcohol/week: 0.0 standard drinks  . Drug use: No  . Sexual activity: Not Currently    Birth control/protection: Surgical    Comment: HYST.. 1st intercourse- 13, partners- 6  Other Topics Concern  . 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 Difficulty of Paying Living Expenses: Not on file  Food Insecurity:   . Worried About Charity fundraiser in the Last Year: Not on file  . Ran Out of Food in the Last Year: Not on file  Transportation Needs:   . Lack of Transportation (Medical): Not on file  . Lack of Transportation (Non-Medical): Not on file  Physical Activity:   . Days of Exercise per Week: Not on file  . Minutes of Exercise per Session: Not on file  Stress:   . Feeling of Stress : Not on file  Social Connections:   . Frequency of Communication with Friends and Family: Not on file  . Frequency of Social Gatherings with Friends and Family: Not on file  . Attends Religious Services: Not on file  . Active Member of Clubs or Organizations: Not on file  . Attends Archivist Meetings: Not on file  . Marital Status: Not on file    Allergies  Allergen Reactions  . Nsaids     r/t gastric surgery  . Statins    Family History  Adopted: Yes  Family history unknown: Yes        Current Outpatient Medications (Hematological):  Marland Kitchen  Cyanocobalamin (B-12) 1000 MCG SUBL, Place 1,000 mcg under the tongue daily.  Current Outpatient Medications (Other):  Marland Kitchen  buPROPion (WELLBUTRIN XL) 150 MG 24 hr tablet, Take 1 tablet (150 mg total) by mouth daily. .  busPIRone (BUSPAR) 5 MG tablet, TAKE 1 TABLET BY MOUTH 3 TIMES DAILY .  Calcium Carb-Cholecalciferol (CALCIUM PLUS VITAMIN D3 PO), Take 1 tablet by mouth 3 (three) times daily.  .  cephALEXin (KEFLEX) 500 MG capsule, Take 1 capsule (500 mg total)  by mouth 2 (two) times daily. Marland Kitchen  docusate sodium (COLACE) 100 MG capsule, Take 1 capsule (100 mg total) by mouth 3 (three) times daily as needed. Marland Kitchen  esomeprazole (NEXIUM) 40 MG capsule, TAKE 1 CAPSULE BY MOUTH TWICE DAILY BEFORE A MEAL .  gabapentin (NEURONTIN) 100 MG capsule, Take 2 capsules (200 mg total) by mouth at bedtime. Marland Kitchen  glucose blood (FREESTYLE LITE) test strip, Use as instructed .  glucose blood test strip, Use to check blood sugars daily. Marland Kitchen  linaclotide (LINZESS) 145 MCG CAPS capsule, Take 1 capsule (145 mcg total) by mouth daily before breakfast. (Patient taking differently: Take 145 mcg by mouth daily as needed (constipation). ) .  Multiple Vitamins-Minerals (MULTIVITAMIN PO), Take 1 tablet by mouth 2 (two) times daily.  .  ondansetron (ZOFRAN-ODT) 4 MG disintegrating tablet, DISSOLVE 1 TABLET BY MOUTH EVERY 8 HOURS AS NEEDED FOR NAUSEA OR VOMITING (MAY TAKE 2 FOR ONE INEFFECTIVE) .  sucralfate (CARAFATE) 1 g tablet, TAKE 1 TABLET BY MOUTH 4 TIMES DAILY (WITH MEALS AND AT BEDTIME) .  Vitamin D, Ergocalciferol, (DRISDOL) 1.25 MG (50000 UT) CAPS capsule, TAKE 1 CAPSULE BY MOUTH EVERY 7 DAYS    Past medical history, social, surgical and family history all reviewed in electronic medical record.  No pertanent information  unless stated regarding to the chief complaint.   Review of Systems:  No headache, visual changes, nausea, vomiting, diarrhea, constipation, dizziness, abdominal pain, skin rash, fevers, chills, night sweats, weight loss, swollen lymph nodes, body aches, joint swelling, muscle aches, chest pain, shortness of breath, mood changes.   Objective  Blood pressure 126/86, pulse 87, height 5\' 2"  (1.575 m), weight 158 lb (71.7 kg), SpO2 98 %.    General: No apparent distress alert and oriented x3 mood and affect normal, dressed appropriately.  HEENT: Pupils equal, extraocular movements intact  Respiratory: Patient's speak in full sentences and does not appear short of breath  Cardiovascular: No lower extremity edema, non tender, no erythema  Skin: Warm dry intact with no signs of infection or rash on extremities or on axial skeleton.  Abdomen: Soft nontender  Neuro: Cranial nerves II through XII are intact, neurovascularly intact in all extremities with 2+ DTRs and 2+ pulses.  Lymph: No lymphadenopathy of posterior or anterior cervical chain or axill  Antalgic gait Right hip exam does have significant amount of bruising noted.  Patient does have a small hematoma over the greater trochanteric area.  Severely tender to palpation even to light palpation.  Neurovascularly intact distally.    Impression and Recommendations:     This case required medical decision making of moderate complexity. The above documentation has been reviewed and is accurate and complete Lyndal Pulley, DO       Note: This dictation was prepared with Dragon dictation along with smaller phrase technology. Any transcriptional errors that result from this process are unintentional.

## 2019-05-09 ENCOUNTER — Encounter: Payer: Self-pay | Admitting: Family Medicine

## 2019-05-10 MED ORDER — TRAMADOL HCL 50 MG PO TABS: 50.0000 mg | ORAL_TABLET | Freq: Three times a day (TID) | ORAL | 0 refills | Status: AC | PRN

## 2019-05-10 MED FILL — traMADol HCL 50 MG TABS: 50 | 5 days supply | Qty: 15 | Fill #0

## 2019-05-17 ENCOUNTER — Ambulatory Visit: Payer: 59 | Admitting: Internal Medicine

## 2019-05-27 ENCOUNTER — Other Ambulatory Visit: Payer: Self-pay

## 2019-05-27 ENCOUNTER — Encounter: Payer: Self-pay | Admitting: Family Medicine

## 2019-05-27 ENCOUNTER — Ambulatory Visit (INDEPENDENT_AMBULATORY_CARE_PROVIDER_SITE_OTHER): Payer: 59

## 2019-05-27 ENCOUNTER — Ambulatory Visit (INDEPENDENT_AMBULATORY_CARE_PROVIDER_SITE_OTHER): Payer: 59 | Admitting: Family Medicine

## 2019-05-27 VITALS — BP 132/78 | HR 70 | Ht 62.0 in | Wt 160.0 lb

## 2019-05-27 DIAGNOSIS — G8929 Other chronic pain: Secondary | ICD-10-CM | POA: Diagnosis not present

## 2019-05-27 DIAGNOSIS — S7001XD Contusion of right hip, subsequent encounter: Secondary | ICD-10-CM

## 2019-05-27 DIAGNOSIS — M545 Low back pain, unspecified: Secondary | ICD-10-CM | POA: Insufficient documentation

## 2019-05-27 DIAGNOSIS — M25551 Pain in right hip: Secondary | ICD-10-CM

## 2019-05-27 MED ORDER — PREDNISONE 50 MG PO TABS: ORAL_TABLET | ORAL | 0 refills | Status: DC

## 2019-05-27 MED ORDER — VITAMIN D (ERGOCALCIFEROL) 1.25 MG (50000 UNIT) PO CAPS: ORAL_CAPSULE | ORAL | 0 refills | Status: DC

## 2019-05-27 MED FILL — VIT D2 1.25 MG (50,000 UNIT: 1.25 MG | 84 days supply | Qty: 12 | Fill #0

## 2019-05-27 MED FILL — predniSONE 50 MG TABS: 50 | 5 days supply | Qty: 5 | Fill #0

## 2019-05-27 NOTE — Assessment & Plan Note (Signed)
Resolving, still having discomfort overall.  Still has some swelling noted on ultrasound.  Not completely resolved at this time.  I do think will be better.  I am concerned though the patient spondylolisthesis may have some exacerbation and will give him prednisone for short course.  Patient knows to monitor for side effects.  Test of the knees first in the past.  Discussed monitoring for GI discomfort.  Follow-up with me again in 4 weeks to make sure completely Lenis Noon will call sooner if necessary.

## 2019-05-27 NOTE — Assessment & Plan Note (Signed)
Degenerative changes.  Patient has known L4-L5 spondylolisthesis.  Discussed with patient in great length.  We will monitor and make sure patient does not have any true radicular symptoms.  We discussed prednisone to try to decrease the inflammation.  Icing regimen, compression for the side of the hip.  Follow-up again in 4 to 6 weeks

## 2019-05-27 NOTE — Progress Notes (Signed)
[place] [address] [place] Phone: [phone] Subjective:   I Sandra [name] am serving as a Education administrator for Dr. Hulan Saas.  This visit occurred during the SARS-CoV-2 public health emergency.  Safety protocols were in place, including screening questions prior to the visit, additional usage of staff PPE, and extensive cleaning of exam room while observing appropriate contact time as indicated for disinfecting solutions.   I'm seeing this patient by the request  of:  Sandra Rail, MD  CC: Hip pain follow-up  [id number]   05/07/2019 Hematoma noted, will get x-rays to rule out any type of greater trochanteric fracture.  Patient should relatively do well with conservative therapy.  Discussed icing regimen and home exercises.  Discussed heat and warm compresses over-the-counter gels, follow-up again in 3 weeks to restart manipulation  05/27/2019 Sandra [name] is a 59 y.o. female coming in with complaint of right hip pain. Patient states the hip is TTP and sore down to the ankle. States there is a knot the size of a tennis ball on the lateral hip. Lower back pain as well.  25% better patient is is and is having more discomfort than usual.  Reviewed patient's imaging previously.  Patient's imaging did show that there is no fracture noted.  Patient is back though does have a known L4-L5 spondylolisthesis.  Mild radicular symptoms now occurring down the legs.     Past Medical History:  Diagnosis Date  . Anxiety   . Arthritis   . CAD (coronary artery disease)    nonobstructive CAD in LAD per 2013 CTA coronary morph  . Cancer [facility]) 2006   uterine  . Complication of anesthesia PT STATES HAS LETTER FROM [place] [facility]. STATING THAT SHE HAS A VERY SMALL AIRWAY (LAST SURG. HYSTERECTOMY AT [facility] IN 2006   PT STATES LYNN BEASON CRNA WHOM WORKS HERE TOLD SHE WOULD BE OKAY TO BE DONE AT [facility]  . Depression   . Diabetes mellitus    no meds taken  now, checks cbg once or twice a week   . 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 GERD (gastroesophageal reflux disease)   . Heart murmur    with last visit, MD states "outgrown" murmur  . Hemorrhoids, internal, with bleeding and Grade 2 prolapse 03/05/2013   All positions seen an anoscopy RP columns banded 03/05/2013 LL and RA banded 03/18/2013     . History of endometrial cancer 2006 ---- S/P ABD. HYSTERECTOMY   STAGE I ENDOMETROID CARCINOMA  . Hx of adenomatous colonic polyps 2011   BENIGN  . Hyperlipidemia    no meds now  . Hypertension   . Morbid obesity, weight - 203, BMI - 38.4 05/12/2013  . OSA on CPAP    cpap setting of 16  . Sleep apnea    no CPAP since surgery   Past Surgical History:  Procedure Laterality Date  . ABDOMINAL HYSTERECTOMY  12-20-2004   TAH  . BILATERAL BREAST REDUCTION  11-16-1999  . BREATH TEK H PYLORI N/A 05/27/2013   Procedure: BREATH TEK H PYLORI;  Surgeon: Shann Medal, MD;  Location: [facility];  Service: General;  Laterality: N/A;  . [place]  . COLONOSCOPY    . COLONOSCOPY N/A 04/25/2014   Procedure: COLONOSCOPY;  Surgeon: Gatha Mayer, MD;  [facility];  Service: Endoscopy;  Laterality: N/A;  . CYSTOSCOPY  09/06/2011   Procedure: CYSTOSCOPY;  Surgeon: Shane Crutch  Gaynelle Arabian, MD;  Location: [facility]  Service: Urology;  Laterality: N/A;  LYNX SLING   . ESOPHAGOGASTRODUODENOSCOPY (EGD) WITH PROPOFOL N/A 09/27/2014   Procedure: ESOPHAGOGASTRODUODENOSCOPY (EGD) WITH PROPOFOL;  Surgeon: Gatha Mayer, MD;  [facility];  Service: Endoscopy;  Laterality: N/A;  . ESOPHAGOGASTRODUODENOSCOPY (EGD) WITH PROPOFOL N/A 03/11/2016   Procedure: ESOPHAGOGASTRODUODENOSCOPY (EGD) WITH PROPOFOL;  Surgeon: Jerene Bears, MD;  [facility];  Service: Endoscopy;  Laterality: N/A;  . GASTRIC BYPASS  10/04/2013  . GASTRIC ROUX-EN-Y N/A 10/04/2013   Procedure: LAPAROSCOPIC ROUX-EN-Y GASTRIC BYPASS  WITH UPPER ENDOSCOPY;  Surgeon: Shann Medal, MD;  [facility];  Service: General;  Laterality: N/A;  . HEMORRHOID BANDING  02/2013  . PUBOVAGINAL SLING  09/06/2011   Procedure: Gaynelle Arabian;  Surgeon: Ailene Rud, MD;  Location: [facility];  Service: Urology;  Laterality: N/A;  . REDUCTION MAMMAPLASTY Bilateral 2001  . SHOULDER ARTHROSCOPY Right 10/23/2017   Procedure: RIGHT SHOULDER ARTHROSCOPIC ROTATOR CUFF REPAIR VS. DEBRIDEMENT, SUBACROMIAL DECOMPARESSION, DISTAL CLAVICAL EXCISION;  Surgeon: Tania Ade, MD;  Location: [place];  Service: Orthopedics;  Laterality: Right;  . UVULOPALATOPHARYNGOPLASTY, TONSILLECTOMY AND SEPTOPLASTY  1994   Social History   Socioeconomic History  . Marital status: Single    Spouse name: Not on file  . Number of children: Not on file  . Years of education: Not on file  . Highest education level: Not on file  Occupational History  . Not on file  Tobacco Use  . Smoking status: Never Smoker  . Smokeless tobacco: Never Used  Substance and Sexual Activity  . Alcohol use: No    Alcohol/week: 0.0 standard drinks  . Drug use: No  . Sexual activity: Not Currently    Birth control/protection: Surgical    Comment: HYST.. 1st intercourse- 13, partners- 6  Other Topics Concern  . 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 Difficulty of Paying Living Expenses: Not on file  Food Insecurity:   . Worried About Charity fundraiser in the Last Year: Not on file  . Ran Out of Food in the Last Year: Not on file  Transportation Needs:   . Lack of Transportation (Medical): Not on file  . Lack of Transportation (Non-Medical): Not on file  Physical Activity:   . Days of Exercise per Week: Not on file  . Minutes of  Exercise per Session: Not on file  Stress:   . Feeling of Stress : Not on file  Social Connections:   . Frequency of Communication with Friends and Family: Not on file  . Frequency of Social Gatherings with Friends and Family: Not on file  . Attends Religious Services: Not on file  . Active Member of Clubs or Organizations: Not on file  . Attends Archivist Meetings: Not on file  . Marital Status: Not on file   Allergies  Allergen Reactions  . Nsaids     r/t gastric surgery  . Statins    Family History  Adopted: Yes  Family history unknown: Yes    Current Outpatient Medications (Endocrine & Metabolic):  .  predniSONE (DELTASONE) 50 MG tablet, 1 tablet by mouth daily     Current Outpatient Medications (Hematological):  Marland Kitchen  Cyanocobalamin (B-12) 1000 MCG SUBL, Place 1,000 mcg under the tongue daily.  Current Outpatient Medications (Other):  .  busPIRone (BUSPAR) 5 MG tablet, TAKE 1 TABLET BY MOUTH 3 TIMES DAILY .  Calcium Carb-Cholecalciferol (CALCIUM PLUS VITAMIN D3 PO), Take 1 tablet by mouth 3 (three) times daily.  Marland Kitchen  docusate sodium (COLACE) 100 MG capsule, Take 1 capsule (100 mg total) by mouth 3 (three) times daily as needed. Marland Kitchen  esomeprazole (NEXIUM) 40 MG capsule, TAKE 1 CAPSULE BY MOUTH TWICE DAILY BEFORE A MEAL .  gabapentin (NEURONTIN) 100 MG capsule, Take 2 capsules (200 mg total) by mouth at bedtime. Marland Kitchen  glucose blood (FREESTYLE LITE) test strip, Use as instructed .  glucose blood test strip, Use to check blood sugars daily. Marland Kitchen  linaclotide (LINZESS) 145 MCG CAPS capsule, Take 1 capsule (145 mcg total) by mouth daily before breakfast. (Patient taking differently: Take 145 mcg by mouth daily as needed (constipation). ) .  Multiple Vitamins-Minerals (MULTIVITAMIN PO), Take 1 tablet by mouth 2 (two) times daily.  .  ondansetron (ZOFRAN-ODT) 4 MG disintegrating tablet, DISSOLVE 1 TABLET BY MOUTH EVERY 8 HOURS AS NEEDED FOR NAUSEA OR VOMITING (MAY TAKE 2 FOR ONE  INEFFECTIVE) .  sucralfate (CARAFATE) 1 g tablet, TAKE 1 TABLET BY MOUTH 4 TIMES DAILY (WITH MEALS AND AT BEDTIME) .  Vitamin D, Ergocalciferol, (DRISDOL) 1.25 MG (50000 UT) CAPS capsule, TAKE 1 CAPSULE BY MOUTH EVERY 7 DAYS   Reviewed prior external information including notes and imaging from  primary care provider As well as notes that were available from care everywhere and other healthcare systems.  Past medical history, social, surgical and family history all reviewed in electronic medical record.  No pertanent information unless stated regarding to the chief complaint.   Review of Systems:  No headache, visual changes, nausea, vomiting, diarrhea, constipation, dizziness, abdominal pain, skin rash, fevers, chills, night sweats, weight loss, swollen lymph nodes, body aches, joint swelling, chest pain, shortness of breath, mood changes. POSITIVE muscle aches  Objective  Blood pressure 132/78, pulse 70, height 5\' 2"  (1.575 m), weight 160 lb (72.6 kg), SpO2 97 %.   General: No apparent distress alert and oriented x3 mood and affect normal, dressed appropriately.  HEENT: Pupils equal, extraocular movements intact  Respiratory: Patient's speak in full sentences and does not appear short of breath  Cardiovascular: No lower extremity edema, non tender, no erythema  Skin: Warm dry intact with no signs of infection or rash on extremities or on axial skeleton.  Abdomen: Soft nontender  Neuro: Cranial nerves II through XII are intact, neurovascularly intact in all extremities with 2+ DTRs and 2+ pulses.  Lymph: No lymphadenopathy of posterior or anterior cervical chain or axillae bilaterally.  Gait normal with good balance and coordination.  MSK:  tender with full range of motion and good stability and symmetric strength and tone of shoulders, elbows, wrist, knee and ankles bilaterally.  Right hip exam shows the patient does have tender to palpation over the lateral area.  Hematoma is improved  but still somewhat smaller. Still severely tender to palpation.  Nontender over the paraspinal musculature lumbar spine as well.  Tightness with straight leg test but no true radicular symptoms.  Limited musculoskeletal ultrasound was performed and interpreted by Lyndal Pulley  Limited ultrasound of patient's right hip shows that there is a hematoma still noted but does appear to have some coagulation noted already.   Impression and Recommendations:     This case required  medical decision making of moderate complexity. The above documentation has been reviewed and is accurate and complete Lyndal Pulley, DO       Note: This dictation was prepared with Dragon dictation along with smaller phrase technology. Any transcriptional errors that result from this process are unintentional.

## 2019-05-27 NOTE — Patient Instructions (Addendum)
Good to see you Prednisone sent in Warm compress and massage Continue Arnica See me again in 4-6 weeks

## 2019-06-02 ENCOUNTER — Encounter: Payer: Self-pay | Admitting: Internal Medicine

## 2019-06-29 ENCOUNTER — Other Ambulatory Visit: Payer: Self-pay

## 2019-06-29 ENCOUNTER — Ambulatory Visit (AMBULATORY_SURGERY_CENTER): Payer: Self-pay | Admitting: *Deleted

## 2019-06-29 VITALS — Ht 62.0 in | Wt 150.0 lb

## 2019-06-29 DIAGNOSIS — Z01818 Encounter for other preprocedural examination: Secondary | ICD-10-CM

## 2019-06-29 DIAGNOSIS — Z86010 Personal history of colonic polyps: Secondary | ICD-10-CM

## 2019-06-29 NOTE — Progress Notes (Signed)
No egg or soy allergy known to patient  No issues with past sedation with any surgeries  or procedures, no intubation problems - told has a small airway but J Nulty CRNA okay'd [facility] colon  No diet pills per patient No home 02 use per patient  No blood thinners per patient  Pt denies issues with constipation  No A fib or A flutter  EMMI video sent to pt's e mail  Jenny Reichmann said [facility] for [facility]  Per pt   Due to the COVID-19 pandemic we are asking patients to follow these guidelines. Please only bring one care partner. Please be aware that your care partner may wait in the car in the parking lot or if they feel like they will be too hot to wait in the car, they may wait in the lobby on the 4th floor. All care partners are required to wear a mask the entire time (we do not have any that we can provide them), they need to practice social distancing, and we will do a Covid check for all patient's and care partners when you arrive. Also we will check their temperature and your temperature. If the care partner waits in their car they need to stay in the parking lot the entire time and we will call them on their cell phone when the patient is ready for discharge so they can bring the car to the front of the building. Also all patient's will need to wear a mask into building.

## 2019-07-01 ENCOUNTER — Other Ambulatory Visit: Payer: Self-pay

## 2019-07-01 ENCOUNTER — Ambulatory Visit: Payer: 59 | Admitting: Family Medicine

## 2019-07-01 ENCOUNTER — Encounter: Payer: Self-pay | Admitting: Family Medicine

## 2019-07-01 ENCOUNTER — Ambulatory Visit (INDEPENDENT_AMBULATORY_CARE_PROVIDER_SITE_OTHER): Payer: 59

## 2019-07-01 VITALS — BP 140/82 | HR 76 | Ht 62.0 in | Wt 150.0 lb

## 2019-07-01 DIAGNOSIS — S7001XD Contusion of right hip, subsequent encounter: Secondary | ICD-10-CM | POA: Diagnosis not present

## 2019-07-01 DIAGNOSIS — M25551 Pain in right hip: Secondary | ICD-10-CM | POA: Diagnosis not present

## 2019-07-01 NOTE — Patient Instructions (Signed)
Continue compression Tried injection Ice is your friend See me again in 5-6 weeks

## 2019-07-01 NOTE — Progress Notes (Signed)
Sandra [name] [place] Phone: [phone] Subjective:   Sandra [name], am serving as a scribe for Dr. Hulan Saas. This visit occurred during the SARS-CoV-2 public health emergency.  Safety protocols were in place, including screening questions prior to the visit, additional usage of staff PPE, and extensive cleaning of exam room while observing appropriate contact time as indicated for disinfecting solutions.   I'm seeing this patient by the request  of:  [name], MD  CC: Back pain and hip pain follow-up  [id number]   05/27/2019 Resolving, still having discomfort overall.  Still has some swelling noted on ultrasound.  Not completely resolved at this time.  I do think will be better.  I am concerned though the patient spondylolisthesis may have some exacerbation and will give him prednisone for short course.  Patient knows to monitor for side effects.  Test of the knees first in the past.  Discussed monitoring for GI discomfort.  Follow-up with me again in 4 weeks to make sure completely Sandra [name] will call sooner if necessary.  Degenerative changes.  Patient has known L4-L5 spondylolisthesis.  Discussed with patient in great length.  We will monitor and make sure patient does not have any true radicular symptoms.  We discussed prednisone to try to decrease the inflammation.  Icing regimen, compression for the side of the hip.  Follow-up again in 4 to 6 weeks  Update 07/01/2019 Sandra [name] is a 59 y.o. female coming in with complaint of back and right hip pain. Patient states that she is doing better. Prednisone helped to decrease pain and improve mobility. Pain with lying on the right side. Does have weakness in quad with a lot of walking.  States that has not improved overall but continues to have some discomfort and pain though.  Patient still states that laying on the side gets more discomfort       Past Medical  History:  Diagnosis Date  . Anxiety   . Arthritis   . CAD (coronary artery disease)    nonobstructive CAD in LAD per 2013 CTA coronary morph  . Cancer [place]) 2006   uterine  . Complication of anesthesia PT STATES HAS LETTER FROM [place] [facility]. STATING THAT SHE HAS A VERY SMALL AIRWAY (LAST SURG. HYSTERECTOMY AT [facility] IN 2006   PT STATES LYNN BEASON CRNA WHOM WORKS HERE TOLD SHE WOULD BE OKAY TO BE DONE AT [facility]  . Depression   . Diabetes mellitus    [name] meds taken now, checks cbg once or twice a week   . 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 GERD (gastroesophageal reflux disease)   . Heart murmur    with last visit, MD states "outgrown" murmur  . Hemorrhoids, internal, with bleeding and Grade 2 prolapse 03/05/2013   All positions seen an anoscopy RP columns banded 03/05/2013 LL and RA banded 03/18/2013     . History of endometrial cancer 2006 ---- S/P ABD. HYSTERECTOMY   STAGE I ENDOMETROID CARCINOMA  . Hx of adenomatous colonic polyps 2011   BENIGN  . Hyperlipidemia    [name] meds now  . Hypertension   . Morbid obesity, weight - 203, BMI - 38.4 05/12/2013  . OSA on CPAP    cpap setting of 16  . Sleep apnea    [name] CPAP since surgery   Past Surgical History:  Procedure Laterality Date  . ABDOMINAL HYSTERECTOMY  12-20-2004   TAH  .  BILATERAL BREAST REDUCTION  11-16-1999  . BREATH TEK H PYLORI N/A 05/27/2013   Procedure: BREATH TEK H PYLORI;  Surgeon: Shann Medal, MD;  Location: [facility];  Service: General;  Laterality: N/A;  . [place]  . COLONOSCOPY    . COLONOSCOPY N/A 04/25/2014   Procedure: COLONOSCOPY;  Surgeon: Gatha Mayer, MD;  Location: [facility];  Service: Endoscopy;  Laterality: N/A;  . CYSTOSCOPY  09/06/2011   Procedure: CYSTOSCOPY;  Surgeon: Ailene Rud, MD;  Location[facility];  Service: Urology;  Laterality: N/A;  LYNX SLING   . ESOPHAGOGASTRODUODENOSCOPY (EGD) WITH PROPOFOL N/A 09/27/2014   Procedure:  ESOPHAGOGASTRODUODENOSCOPY (EGD) WITH PROPOFOL;  Surgeon: Gatha Mayer, MD;  [facility];  Service: Endoscopy;  Laterality: N/A;  . ESOPHAGOGASTRODUODENOSCOPY (EGD) WITH PROPOFOL N/A 03/11/2016   Procedure: ESOPHAGOGASTRODUODENOSCOPY (EGD) WITH PROPOFOL;  Surgeon: Jerene Bears, MD;  [facility];  Service: Endoscopy;  Laterality: N/A;  . GASTRIC BYPASS  10/04/2013  . GASTRIC ROUX-EN-Y N/A 10/04/2013   Procedure: LAPAROSCOPIC ROUX-EN-Y GASTRIC BYPASS WITH UPPER ENDOSCOPY;  Surgeon: Shann Medal, MD;  [facility];  Service: General;  Laterality: N/A;  . HEMORRHOID BANDING  02/2013  . POLYPECTOMY    . PUBOVAGINAL SLING  09/06/2011   Procedure: Gaynelle Arabian;  Surgeon: Ailene Rud, MD;  Location[facility];  Service: Urology;  Laterality: N/A;  . REDUCTION MAMMAPLASTY Bilateral 2001  . SHOULDER ARTHROSCOPY Right 10/23/2017   Procedure: RIGHT SHOULDER ARTHROSCOPIC ROTATOR CUFF REPAIR VS. DEBRIDEMENT, SUBACROMIAL DECOMPARESSION, DISTAL CLAVICAL EXCISION;  Surgeon: Tania Ade, MD;  Location: [place];  Service: Orthopedics;  Laterality: Right;  . UVULOPALATOPHARYNGOPLASTY, TONSILLECTOMY AND SEPTOPLASTY  1994   Social History   Socioeconomic History  . Marital status: Single    Spouse name: Not on file  . Number of children: Not on file  . Years of education: Not on file  . Highest education level: Not on file  Occupational History  . Not on file  Tobacco Use  . Smoking status: Never Smoker  . Smokeless tobacco: Never Used  Substance and Sexual Activity  . Alcohol use: [name]    Alcohol/week: 0.0 standard drinks  . Drug use: [name]  . Sexual activity: Not Currently    Birth control/protection: Surgical    Comment: HYST.. 1st intercourse- 13, partners- 6  Other Topics Concern  . 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 Difficulty of Paying Living Expenses: Not on file  Food Insecurity:   . Worried About Charity fundraiser in the Last Year: Not on file  . Ran Out of Food in the Last Year: Not on file  Transportation Needs:   . Lack of Transportation (Medical): Not on file  . Lack of Transportation (Non-Medical): Not on file  Physical Activity:   . Days of Exercise per Week: Not on file  . Minutes of Exercise per Session: Not on file  Stress:   . Feeling of Stress : Not on file  Social Connections:   . Frequency of Communication with Friends and Family: Not on file  . Frequency of Social Gatherings with Friends and Family: Not on file  . Attends Religious Services: Not on file  . Active  Member of Clubs or Organizations: Not on file  . Attends Archivist Meetings: Not on file  . Marital Status: Not on file   Allergies  Allergen Reactions  . Nsaids     r/t gastric surgery  . Statins    Family History  Adopted: Yes    Current Outpatient Medications (Endocrine & Metabolic):  .  predniSONE (DELTASONE) 50 MG tablet, 1 tablet by mouth daily     Current Outpatient Medications (Hematological):  Marland Kitchen  Cyanocobalamin (B-12) 1000 MCG SUBL, Place 1,000 mcg under the tongue daily.  Current Outpatient Medications (Other):  .  busPIRone (BUSPAR) 5 MG tablet, TAKE 1 TABLET BY MOUTH 3 TIMES DAILY .  Calcium Carb-Cholecalciferol (CALCIUM PLUS VITAMIN D3 PO), Take 1 tablet by mouth 3 (three) times daily.  Marland Kitchen  docusate sodium (COLACE) 100 MG capsule, Take 1 capsule (100 mg total) by mouth 3 (three) times daily as needed. Marland Kitchen  esomeprazole (NEXIUM) 40 MG capsule, TAKE 1 CAPSULE BY MOUTH TWICE DAILY BEFORE A MEAL .  gabapentin (NEURONTIN) 100 MG capsule, Take 2 capsules (200 mg total) by mouth at bedtime. Marland Kitchen  glucose blood (FREESTYLE LITE) test strip, Use as instructed .  glucose blood test strip, Use to check  blood sugars daily. Marland Kitchen  linaclotide (LINZESS) 145 MCG CAPS capsule, Take 1 capsule (145 mcg total) by mouth daily before breakfast. (Patient taking differently: Take 145 mcg by mouth daily as needed (constipation). ) .  Multiple Vitamins-Minerals (MULTIVITAMIN PO), Take 1 tablet by mouth 2 (two) times daily.  .  ondansetron (ZOFRAN-ODT) 4 MG disintegrating tablet, DISSOLVE 1 TABLET BY MOUTH EVERY 8 HOURS AS NEEDED FOR NAUSEA OR VOMITING (MAY TAKE 2 FOR ONE INEFFECTIVE) .  sucralfate (CARAFATE) 1 g tablet, TAKE 1 TABLET BY MOUTH 4 TIMES DAILY (WITH MEALS AND AT BEDTIME) .  Vitamin D, Ergocalciferol, (DRISDOL) 1.25 MG (50000 UNIT) CAPS capsule, TAKE 1 CAPSULE BY MOUTH EVERY 7 DAYS   Reviewed prior external information including notes and imaging from  primary care provider As well as notes that were available from care everywhere and other healthcare systems.  Past medical history, social, surgical and family history all reviewed in electronic medical record.  [name] pertanent information unless stated regarding to the chief complaint.   Review of Systems:  [name] headache, visual changes, nausea, vomiting, diarrhea, constipation, dizziness, abdominal pain, skin rash, fevers, chills, night sweats, weight loss, swollen lymph nodes, body aches, joint swelling, chest pain, shortness of breath, mood changes. POSITIVE muscle aches  Objective  Blood pressure 140/82, pulse 76, height 5\' 2"  (1.575 m), weight 150 lb (68 kg), SpO2 98 %.   General: [name] apparent distress alert and oriented x3 mood and affect normal, dressed appropriately.  HEENT: Pupils equal, extraocular movements intact  Respiratory: Patient's speak in full sentences and does not appear short of breath  Cardiovascular: [name] lower extremity edema, non tender, [name] erythema  Skin: Warm dry intact with [name] signs of infection or rash on extremities or on axial skeleton.  Abdomen: Soft nontender  Neuro: Cranial nerves II through XII are intact,  neurovascularly intact in all extremities with 2+ DTRs and 2+ pulses.  Lymph: [name] lymphadenopathy of posterior or anterior cervical chain or axillae bilaterally.  Gait normal with good balance and coordination.  MSK:  tender with range of motion and good stability and symmetric strength and tone of shoulders, elbows, wrist, , knee and ankles bilaterally.  Right hip exam has improvement in range of motion but still severe  tenderness to palpation overlying the greater trochanteric area.  Mild very small hematoma noted.   Procedure: Real-time Ultrasound Guided Injection of right greater trochanteric bursitis secondary to patient's body habitus Device: GE Logiq Q7 Ultrasound guided injection is preferred based studies that show increased duration, increased effect, greater accuracy, decreased procedural pain, increased response rate, and decreased cost with ultrasound guided versus blind injection.  Verbal informed consent obtained.  Time-out conducted.  Noted [name] overlying erythema, induration, or other signs of local infection.  Skin prepped in a sterile fashion.  Local anesthesia: Topical Ethyl chloride.  With sterile technique and under real time ultrasound guidance:  Greater trochanteric area was visualized and patient's bursa was noted. A 22-gauge 3 inch needle was inserted and 4 cc of 0.5% Marcaine and 1 cc of Kenalog 40 mg/dL was injected. Pictures taken Completed without difficulty  Pain immediately resolved suggesting accurate placement of the medication.  Advised to call if fevers/chills, erythema, induration, drainage, or persistent bleeding.  Images permanently stored and available for review in the ultrasound unit.  Impression: Technically successful ultrasound guided injection.   Impression and Recommendations:     This case required medical decision making of moderate complexity. The above documentation has been reviewed and is accurate and complete Sandra Pulley, DO        Note: This dictation was prepared with Dragon dictation along with smaller phrase technology. Any transcriptional errors that result from this process are unintentional.

## 2019-07-01 NOTE — Assessment & Plan Note (Signed)
Chronic problem.  Mild seroma noted.  Patient given injection, tolerated the procedure well, discussed icing regimen with home exercise, discussed which activities to do which wants to avoid.  Increase activity slowly.  Follow-up again in 4 to 8 weeks

## 2019-07-02 ENCOUNTER — Other Ambulatory Visit: Payer: Self-pay | Admitting: Internal Medicine

## 2019-07-02 MED FILL — busPIRone HCL 5 MG TABS: 5 | 30 days supply | Qty: 90 | Fill #0

## 2019-07-05 ENCOUNTER — Encounter: Payer: Self-pay | Admitting: Internal Medicine

## 2019-07-05 ENCOUNTER — Ambulatory Visit (INDEPENDENT_AMBULATORY_CARE_PROVIDER_SITE_OTHER): Payer: 59

## 2019-07-05 ENCOUNTER — Other Ambulatory Visit: Payer: Self-pay

## 2019-07-05 ENCOUNTER — Telehealth: Payer: Self-pay

## 2019-07-05 DIAGNOSIS — Z1159 Encounter for screening for other viral diseases: Secondary | ICD-10-CM

## 2019-07-05 NOTE — Telephone Encounter (Signed)
-----   Message from Gatha Mayer, MD sent at 07/05/2019  1:46 PM EST ----- Regarding: RE: Difficult Intubation pt John,  I was unaware that glide scope was used   So we will need to schedule at the hospital.  Francetta Found - please communicate with [place] about this and we will figure out when we can do it.  I will text her also  Glendell Docker ----- Message ----- From: Osvaldo Angst, CRNA Sent: 07/05/2019   8:00 AM EST To: Gatha Mayer, MD Subject: Difficult Intubation pt                        Dr. Carlean Purl,  To begin, I apologize for the late notice.  I have been trying to figure out a way to safely have [name] as a [place]  pt.  Georgina Snell, who would have been her CRNA, and I discussed it.  He reviewed the chart and knows the MDA and CRNA who attended her for her most recent surgery.  By his eval they are very good clinicians, tried to intubate her with a conventional laryngoscope, but could not so transitioned to a glide scope.  Consequently I feel the safest option for her would be to have her procedure at the hospital.  I have been communicatiing with [place] and have informed her of my decision.  Sorry to start your week this way.  Best regards,  Osvaldo Angst

## 2019-07-05 NOTE — Telephone Encounter (Signed)
Left message for pt regarding message below.

## 2019-07-06 ENCOUNTER — Other Ambulatory Visit: Payer: Self-pay

## 2019-07-06 ENCOUNTER — Telehealth: Payer: Self-pay

## 2019-07-06 DIAGNOSIS — Z86010 Personal history of colonic polyps: Secondary | ICD-10-CM

## 2019-07-06 NOTE — Telephone Encounter (Signed)
Patient's procedure has been changed from [place] to [place]. Patient has new instructions. New date is 07/26/2019 at 10:00AM.

## 2019-07-06 NOTE — Telephone Encounter (Signed)
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

## 2019-07-08 ENCOUNTER — Encounter: Payer: 59 | Admitting: Internal Medicine

## 2019-07-17 MED FILL — GABAPENTIN 100 MG CAPSULE: 100 | 90 days supply | Qty: 180 | Fill #3

## 2019-07-22 ENCOUNTER — Other Ambulatory Visit (HOSPITAL_COMMUNITY)
Admission: RE | Admit: 2019-07-22 | Discharge: 2019-07-22 | Disposition: A | Discharge status: Home / Self Care | Payer: 59 | Source: Ambulatory Visit | Attending: Internal Medicine | Admitting: Internal Medicine

## 2019-07-22 DIAGNOSIS — Z01812 Encounter for preprocedural laboratory examination: Secondary | ICD-10-CM | POA: Insufficient documentation

## 2019-07-22 DIAGNOSIS — Z20822 Contact with and (suspected) exposure to covid-19: Secondary | ICD-10-CM | POA: Diagnosis not present

## 2019-07-22 LAB — SARS CORONAVIRUS 2 (TAT 6-24 HRS): SARS Coronavirus 2: NEGATIVE

## 2019-07-26 ENCOUNTER — Ambulatory Visit (HOSPITAL_COMMUNITY): Payer: 59 | Admitting: Anesthesiology

## 2019-07-26 ENCOUNTER — Other Ambulatory Visit: Payer: Self-pay

## 2019-07-26 ENCOUNTER — Ambulatory Visit (HOSPITAL_COMMUNITY)
Admission: RE | Admit: 2019-07-26 | Discharge: 2019-07-26 | Disposition: A | Discharge status: Home / Self Care | Payer: 59 | Attending: Internal Medicine | Admitting: Internal Medicine

## 2019-07-26 ENCOUNTER — Encounter (HOSPITAL_COMMUNITY)
Admission: RE | Disposition: A | Discharge status: Home / Self Care | Payer: Self-pay | Source: Home / Self Care | Attending: Internal Medicine

## 2019-07-26 ENCOUNTER — Encounter (HOSPITAL_COMMUNITY): Payer: Self-pay | Admitting: Internal Medicine

## 2019-07-26 DIAGNOSIS — M1990 Unspecified osteoarthritis, unspecified site: Secondary | ICD-10-CM | POA: Insufficient documentation

## 2019-07-26 DIAGNOSIS — Z79899 Other long term (current) drug therapy: Secondary | ICD-10-CM | POA: Insufficient documentation

## 2019-07-26 DIAGNOSIS — F419 Anxiety disorder, unspecified: Secondary | ICD-10-CM | POA: Insufficient documentation

## 2019-07-26 DIAGNOSIS — K635 Polyp of colon: Secondary | ICD-10-CM | POA: Diagnosis not present

## 2019-07-26 DIAGNOSIS — G4733 Obstructive sleep apnea (adult) (pediatric): Secondary | ICD-10-CM | POA: Diagnosis not present

## 2019-07-26 DIAGNOSIS — I10 Essential (primary) hypertension: Secondary | ICD-10-CM | POA: Insufficient documentation

## 2019-07-26 DIAGNOSIS — E119 Type 2 diabetes mellitus without complications: Secondary | ICD-10-CM | POA: Insufficient documentation

## 2019-07-26 DIAGNOSIS — K219 Gastro-esophageal reflux disease without esophagitis: Secondary | ICD-10-CM | POA: Insufficient documentation

## 2019-07-26 DIAGNOSIS — I2510 Atherosclerotic heart disease of native coronary artery without angina pectoris: Secondary | ICD-10-CM | POA: Insufficient documentation

## 2019-07-26 DIAGNOSIS — Z888 Allergy status to other drugs, medicaments and biological substances status: Secondary | ICD-10-CM | POA: Insufficient documentation

## 2019-07-26 DIAGNOSIS — Z8711 Personal history of peptic ulcer disease: Secondary | ICD-10-CM | POA: Insufficient documentation

## 2019-07-26 DIAGNOSIS — D124 Benign neoplasm of descending colon: Secondary | ICD-10-CM | POA: Diagnosis not present

## 2019-07-26 DIAGNOSIS — Z1211 Encounter for screening for malignant neoplasm of colon: Secondary | ICD-10-CM | POA: Insufficient documentation

## 2019-07-26 DIAGNOSIS — Z6827 Body mass index (BMI) 27.0-27.9, adult: Secondary | ICD-10-CM | POA: Diagnosis not present

## 2019-07-26 DIAGNOSIS — Z860100 Personal history of colon polyps, unspecified: Secondary | ICD-10-CM

## 2019-07-26 DIAGNOSIS — Z8542 Personal history of malignant neoplasm of other parts of uterus: Secondary | ICD-10-CM | POA: Diagnosis not present

## 2019-07-26 DIAGNOSIS — Z886 Allergy status to analgesic agent status: Secondary | ICD-10-CM | POA: Insufficient documentation

## 2019-07-26 DIAGNOSIS — Z9884 Bariatric surgery status: Secondary | ICD-10-CM | POA: Insufficient documentation

## 2019-07-26 HISTORY — PX: COLONOSCOPY WITH PROPOFOL: SHX5780

## 2019-07-26 HISTORY — DX: Failed or difficult intubation, initial encounter: T88.4XXA

## 2019-07-26 HISTORY — PX: POLYPECTOMY: SHX5525

## 2019-07-26 SURGERY — COLONOSCOPY WITH PROPOFOL
Anesthesia: Monitor Anesthesia Care

## 2019-07-26 MED ORDER — LACTATED RINGERS IV SOLN
INTRAVENOUS | Status: DC
  Administered 2019-07-26: 1000 mL via INTRAVENOUS

## 2019-07-26 MED ORDER — SODIUM CHLORIDE 0.9 % IV SOLN: INTRAVENOUS | Status: DC

## 2019-07-26 MED ORDER — PROPOFOL 500 MG/50ML IV EMUL
INTRAVENOUS | Status: DC | PRN
  Administered 2019-07-26: 100 ug/kg/min via INTRAVENOUS
  Administered 2019-07-26: 80 mg via INTRAVENOUS

## 2019-07-26 MED ORDER — LIDOCAINE 2% (20 MG/ML) 5 ML SYRINGE
INTRAMUSCULAR | Status: DC | PRN
  Administered 2019-07-26: 60 mg via INTRAVENOUS

## 2019-07-26 MED ORDER — LACTATED RINGERS IV SOLN: INTRAVENOUS | Status: DC

## 2019-07-26 SURGICAL SUPPLY — 21 items

## 2019-07-26 NOTE — Transfer of Care (Signed)
Immediate Anesthesia Transfer of Care Note  Patient: Sandra [name]  Procedure(s) Performed: Procedure(s): COLONOSCOPY WITH PROPOFOL (N/A) POLYPECTOMY  Patient Location: PACU  Anesthesia Type:MAC Endo  Level of Consciousness: Alert, Awake, Oriented  Airway & Oxygen Therapy: Patient Spontanous Breathing  Post-op Assessment: Report given to RN  Post vital signs: Reviewed and stable  Last Vitals:  Vitals:   07/26/19 0918 07/26/19 1036  BP: 132/80   Pulse: 64 70  Resp: 15 16  Temp: 37 C   SpO2:  628%    Complications: No apparent anesthesia complications

## 2019-07-26 NOTE — H&P (Signed)
[facility] History and Physical   Primary Care Physician:  [name], MD   Reason for Procedure:   hx adenomatous colon polyps  Plan:    colonoscopy     HPI: Sandra [name] is a 59 y.o. female w/ hx adenomatous colon polyps here for a surveillance colonoscopy.   Past Medical History:  Diagnosis Date  . Anxiety   . Arthritis   . CAD (coronary artery disease)    nonobstructive CAD in LAD per 2013 CTA coronary morph  . Cancer [facility]) 2006   uterine  . Complication of anesthesia PT STATES HAS LETTER FROM [place] [facility]. STATING THAT SHE HAS A VERY SMALL AIRWAY (LAST SURG. HYSTERECTOMY AT [facility] IN 2006   PT STATES LYNN BEASON CRNA WHOM WORKS HERE TOLD SHE WOULD BE OKAY TO BE DONE AT [facility]  . Depression   . Diabetes mellitus    no meds taken now, checks cbg once or twice a week   . Difficult intubation    had to use the glidescope due to small airway   . 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 GERD (gastroesophageal reflux disease)   . Heart murmur    with last visit, MD states "outgrown" murmur  . Hemorrhoids, internal, with bleeding and Grade 2 prolapse 03/05/2013   All positions seen an anoscopy RP columns banded 03/05/2013 LL and RA banded 03/18/2013     . History of endometrial cancer 2006 ---- S/P ABD. HYSTERECTOMY   STAGE I ENDOMETROID CARCINOMA  . Hx of adenomatous colonic polyps 2011   BENIGN  . Hyperlipidemia    no meds now  . Hypertension   . Morbid obesity, weight - 203, BMI - 38.4 05/12/2013  . OSA on CPAP    cpap setting of 16  . Sleep apnea    no CPAP since surgery    Past Surgical History:  Procedure Laterality Date  . ABDOMINAL HYSTERECTOMY  12-20-2004   TAH  . BILATERAL BREAST REDUCTION  11-16-1999  . BREATH TEK H PYLORI N/A 05/27/2013   Procedure: BREATH TEK H PYLORI;  Surgeon: Shann Medal, MD;  [facility];  Service: General;  Laterality: N/A;  . [place]  . COLONOSCOPY    . COLONOSCOPY N/A  04/25/2014   Procedure: COLONOSCOPY;  Surgeon: Gatha Mayer, MD;  [facility];  Service: Endoscopy;  Laterality: N/A;  . CYSTOSCOPY  09/06/2011   Procedure: CYSTOSCOPY;  Surgeon: Ailene Rud, MD;  Location: [facility];  Service: Urology;  Laterality: N/A;  LYNX SLING   . ESOPHAGOGASTRODUODENOSCOPY (EGD) WITH PROPOFOL N/A 09/27/2014   Procedure: ESOPHAGOGASTRODUODENOSCOPY (EGD) WITH PROPOFOL;  Surgeon: Gatha Mayer, MD;  [facility];  Service: Endoscopy;  Laterality: N/A;  . ESOPHAGOGASTRODUODENOSCOPY (EGD) WITH PROPOFOL N/A 03/11/2016   Procedure: ESOPHAGOGASTRODUODENOSCOPY (EGD) WITH PROPOFOL;  Surgeon: Jerene Bears, MD;  [facility];  Service: Endoscopy;  Laterality: N/A;  . GASTRIC BYPASS  10/04/2013  . GASTRIC ROUX-EN-Y N/A 10/04/2013   Procedure: LAPAROSCOPIC ROUX-EN-Y GASTRIC BYPASS WITH UPPER ENDOSCOPY;  Surgeon: Shann Medal, MD;  [facility];  Service: General;  Laterality: N/A;  . HEMORRHOID BANDING  02/2013  . POLYPECTOMY    . PUBOVAGINAL SLING  09/06/2011   Procedure: Gaynelle Arabian;  Surgeon: Ailene Rud, MD;  [facility];  Service: Urology;  Laterality: N/A;  . REDUCTION MAMMAPLASTY Bilateral 2001  . SHOULDER ARTHROSCOPY Right 10/23/2017   Procedure: RIGHT  SHOULDER ARTHROSCOPIC ROTATOR CUFF REPAIR VS. DEBRIDEMENT, SUBACROMIAL DECOMPARESSION, DISTAL CLAVICAL EXCISION;  Surgeon: Tania Ade, MD;  Location: [place] [facility];  Service: Orthopedics;  Laterality: Right;  . UVULOPALATOPHARYNGOPLASTY, TONSILLECTOMY AND SEPTOPLASTY  1994    Prior to Admission medications   Medication Sig Start Date End Date Taking? Authorizing Provider  busPIRone (BUSPAR) 5 MG tablet TAKE 1 TABLET BY MOUTH 3 TIMES DAILY Patient taking differently: Take 5 mg by mouth 2 (two) times daily.  07/02/19  Yes Burns, Claudina Lick, MD  Cyanocobalamin (B-12) 1000 MCG SUBL Place 1,000 mcg under the tongue daily.   Yes [provider]  esomeprazole (NEXIUM) 40 MG capsule TAKE 1 CAPSULE BY MOUTH TWICE DAILY BEFORE A MEAL Patient taking differently: Take 40 mg by mouth daily as needed (acid reflux).  02/17/19  Yes Gatha Mayer, MD  gabapentin (NEURONTIN) 100 MG capsule Take 2 capsules (200 mg total) by mouth at bedtime. 10/01/18  Yes Hulan Saas M, DO  glucose blood (FREESTYLE LITE) test strip Use as instructed 09/11/18  Yes Burns, Claudina Lick, MD  glucose blood test strip Use to check blood sugars daily. 09/10/18  Yes Burns, Claudina Lick, MD  linaclotide [facility]) 145 MCG CAPS capsule Take 1 capsule (145 mcg total) by mouth daily before breakfast. Patient taking differently: Take 145 mcg by mouth daily as needed (constipation).  06/03/17  Yes Gatha Mayer, MD  Multiple Vitamins-Minerals (MULTIVITAMIN PO) Take 2 tablets by mouth daily.    Yes [provider]  Vitamin D, Ergocalciferol, (DRISDOL) 1.25 MG (50000 UNIT) CAPS capsule TAKE 1 CAPSULE BY MOUTH EVERY 7 DAYS Patient taking differently: Take 50,000 Units by mouth every 7 (seven) days.  05/27/19  Yes Lyndal Pulley, DO  docusate sodium (COLACE) 100 MG capsule Take 1 capsule (100 mg total) by mouth 3 (three) times daily as needed. Patient not taking: Reported on 07/20/2019 10/23/17   Grier Mitts, PA-C  ondansetron (ZOFRAN-ODT) 4 MG disintegrating tablet DISSOLVE 1 TABLET BY MOUTH EVERY 8 HOURS AS NEEDED FOR NAUSEA OR VOMITING (MAY TAKE 2 FOR ONE INEFFECTIVE) Patient not taking: Reported on 07/20/2019 02/17/19   Gatha Mayer, MD  predniSONE (DELTASONE) 50 MG tablet 1 tablet by mouth daily Patient not taking: Reported on 07/20/2019 05/27/19   Lyndal Pulley, DO  sucralfate (CARAFATE) 1 g tablet TAKE 1 TABLET BY MOUTH 4 TIMES DAILY (WITH MEALS AND AT BEDTIME) Patient taking differently: Take 1 g by mouth daily as needed (acid).  02/17/19   Gatha Mayer, MD    Current Facility-Administered Medications  Medication Dose Route Frequency Provider Last  Rate Last Admin  . 0.9 %  sodium chloride infusion   Intravenous Continuous Gatha Mayer, MD      . lactated ringers infusion   Intravenous Continuous Gatha Mayer, MD 10 mL/hr at 07/26/19 0941 1,000 mL at 07/26/19 0941  . lactated ringers infusion   Intravenous Continuous Gatha Mayer, MD        Allergies as of 07/06/2019 - Review Complete 07/01/2019  Allergen Reaction Noted  . Nsaids  09/06/2016  . 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  Physical Exam: Vital signs in last 24 hours: Temp:  [98.6 F (37 C)] 98.6 F (37 C) (03/29 0918) Pulse Rate:  [64] 64 (03/29 0918) Resp:  [15] 15 (03/29 0918) BP: (132)/(80) 132/80 (03/29 0918) Weight:  [68 kg] 68 kg (03/29 0918)   General:   Alert,  Well-developed, well-nourished, pleasant and cooperative in NAD Lungs:  Clear throughout to auscultation.   Heart:  Regular rate and rhythm; no murmurs, clicks, rubs,  or gallops. Abdomen:  Soft, nontender and nondistended. Normal bowel sounds.   Neuro/Psych:  Alert and cooperative. Normal mood and affect. A and O x 3   @Jakwan Sally  Simonne Maffucci, MD, [facility] [phone] (pager) 07/26/2019 9:47 AM@

## 2019-07-26 NOTE — Discharge Instructions (Signed)
One 2-3 mm polyp removed but not recovered.  Next routine colonoscopy 2028.  I appreciate the opportunity to care for you. Gatha Mayer, MD, FACG    YOU HAD AN ENDOSCOPIC PROCEDURE TODAY: Refer to the procedure report and other information in the discharge instructions given to you for any specific questions about what was found during the examination. If this information does not answer your questions, please call [place] office at [phone] to clarify.   YOU SHOULD EXPECT: Some feelings of bloating in the abdomen. Passage of more gas than usual. Walking can help get rid of the air that was put into your GI tract during the procedure and reduce the bloating. If you had a lower endoscopy (such as a colonoscopy or flexible sigmoidoscopy) you may notice spotting of blood in your stool or on the toilet paper. Some abdominal soreness may be present for a day or two, also.  DIET: Your first meal following the procedure should be a light meal and then it is ok to progress to your normal diet. A half-sandwich or bowl of soup is an example of a good first meal. Heavy or fried foods are harder to digest and may make you feel nauseous or bloated. Drink plenty of fluids but you should avoid alcoholic beverages for 24 hours. If you had a esophageal dilation, please see attached instructions for diet.    ACTIVITY: Your care partner should take you home directly after the procedure. You should plan to take it easy, moving slowly for the rest of the day. You can resume normal activity the day after the procedure however YOU SHOULD NOT DRIVE, use power tools, machinery or perform tasks that involve climbing or major physical exertion for 24 hours (because of the sedation medicines used during the test).   SYMPTOMS TO REPORT IMMEDIATELY: A gastroenterologist can be reached at any hour. Please call [phone]  for any of the following symptoms:  Following lower endoscopy (colonoscopy, flexible  sigmoidoscopy) Excessive amounts of blood in the stool  Significant tenderness, worsening of abdominal pains  Swelling of the abdomen that is new, acute  Fever of 100 or higher  Following upper endoscopy (EGD, EUS, ERCP, esophageal dilation) Vomiting of blood or coffee ground material  New, significant abdominal pain  New, significant chest pain or pain under the shoulder blades  Painful or persistently difficult swallowing  New shortness of breath  Black, tarry-looking or red, bloody stools  FOLLOW UP:  If any biopsies were taken you will be contacted by phone or by letter within the next 1-3 weeks. Call [phone]  if you have not heard about the biopsies in 3 weeks.  Please also call with any specific questions about appointments or follow up tests.

## 2019-07-26 NOTE — Op Note (Signed)
[facility] Patient Name: Sandra [name] Procedure Date: 07/26/2019 MRN: [id number] Attending MD: Gatha Mayer , MD Date of Birth: [birth year] CSN: [id number] Age: 59 Admit Type: Outpatient Procedure:                Colonoscopy Indications:              Surveillance: Personal history of adenomatous                            polyps on last colonoscopy > 5 years ago Providers:                Gatha Mayer, MD, Elmer Ramp. Tilden Dome, RN, Cherylynn Ridges, Technician Referring MD:              Medicines:                Propofol per Anesthesia, Monitored Anesthesia Care Complications:            No immediate complications. Estimated Blood Loss:     Estimated blood loss was minimal. Estimated blood                            loss was minimal. Procedure:                Pre-Anesthesia Assessment:                           - Prior to the procedure, a History and Physical                            was performed, and patient medications and                            allergies were reviewed. The patient's tolerance of                            previous anesthesia was also reviewed. The risks                            and benefits of the procedure and the sedation                            options and risks were discussed with the patient.                            All questions were answered, and informed consent                            was obtained. Prior Anticoagulants: The patient has                            taken no previous anticoagulant or antiplatelet  agents. ASA Grade Assessment: II - A patient with                            mild systemic disease. After reviewing the risks                            and benefits, the patient was deemed in                            satisfactory condition to undergo the procedure.                           After obtaining informed consent, the colonoscope     was passed under direct vision. Throughout the                            procedure, the patient's blood pressure, pulse, and                            oxygen saturations were monitored continuously. The                            CF-HQ190L [id number] Olympus colonoscope was                            introduced through the anus and advanced to the the                            cecum, identified by appendiceal orifice and                            ileocecal valve. The colonoscopy was performed                            without difficulty. The patient tolerated the                            procedure well. The quality of the bowel                            preparation was excellent. The bowel preparation                            used was Miralax via split dose instruction. The                            ileocecal valve, appendiceal orifice, and rectum                            were photographed. Scope In: 10:07:41 AM Scope Out: 10:32:18 AM Scope Withdrawal Time: 0 hours 19 minutes 41 seconds  Total Procedure Duration: 0 hours 24 minutes 37 seconds  Findings:      The perianal and digital rectal examinations were normal.      A 2  to 3 mm polyp was found in the descending colon. The polyp was       sessile. The polyp was removed with a cold snare. Resection was       complete, but the polyp tissue was not retrieved. Verification of       patient identification for the specimen was done. Estimated blood loss       was minimal.      The exam was otherwise without abnormality on direct and retroflexion       views. Impression:               - One 2 to 3 mm polyp in the descending colon,                            removed with a cold snare. Resected and retrieved.                           - The examination was otherwise normal on direct                            and retroflexion views.                           - Personal history of colonic polyps. 2011 ssa/p                             (diminutive) 2015 2 small adenomas Moderate Sedation:      Not Applicable - Patient had care per Anesthesia. Recommendation:           - Patient has a contact number available for                            emergencies. The signs and symptoms of potential                            delayed complications were discussed with the                            patient. Return to normal activities tomorrow.                            Written discharge instructions were provided to the                            patient.                           - Resume previous diet.                           - Continue present medications.                           - Repeat colonoscopy in 7 years for surveillance. Procedure Code(s):        --- Professional ---  45385, Colonoscopy, flexible; with removal of                            tumor(s), polyp(s), or other lesion(s) by snare                            technique Diagnosis Code(s):        --- Professional ---                           Z86.010, Personal history of colonic polyps                           K63.5, Polyp of colon CPT copyright 2019 American Medical Association. All rights reserved. The codes documented in this report are preliminary and upon coder review may  be revised to meet current compliance requirements. Gatha Mayer, MD 07/26/2019 10:53:52 AM This report has been signed electronically. Number of Addenda: 0

## 2019-07-26 NOTE — Anesthesia Preprocedure Evaluation (Addendum)
Anesthesia Evaluation  Patient identified by MRN, date of birth, ID band Patient awake    Reviewed: Allergy & Precautions, NPO status , Patient's Chart, lab work & pertinent test results  Airway Mallampati: II  TM Distance: >3 FB Neck ROM: Full    Dental  (+) Teeth Intact, Dental Advisory Given   Pulmonary sleep apnea and Continuous Positive Airway Pressure Ventilation ,    breath sounds clear to auscultation       Cardiovascular hypertension, + CAD   Rhythm:Regular Rate:Normal     Neuro/Psych PSYCHIATRIC DISORDERS Anxiety Depression    GI/Hepatic Neg liver ROS, PUD, GERD  Medicated,  Endo/Other  diabetes  Renal/GU negative Renal ROS     Musculoskeletal  (+) Arthritis ,   Abdominal Normal abdominal exam  (+)   Peds  Hematology negative hematology ROS (+)   Anesthesia Other Findings   Reproductive/Obstetrics                            Anesthesia Physical Anesthesia Plan  ASA: II  Anesthesia Plan: MAC   Post-op Pain Management:    Induction: Intravenous  PONV Risk Score and Plan: 0 and Propofol infusion  Airway Management Planned: Natural Airway and Simple Face Mask  Additional Equipment: None  Intra-op Plan:   Post-operative Plan:   Informed Consent: I have reviewed the patients History and Physical, chart, labs and discussed the procedure including the risks, benefits and alternatives for the proposed anesthesia with the patient or authorized representative who has indicated his/her understanding and acceptance.       Plan Discussed with: CRNA  Anesthesia Plan Comments:        Anesthesia Quick Evaluation

## 2019-07-26 NOTE — Anesthesia Postprocedure Evaluation (Signed)
Anesthesia Post Note  Patient: Sandra [name]  Procedure(s) Performed: COLONOSCOPY WITH PROPOFOL (N/A ) POLYPECTOMY     Patient location during evaluation: PACU Anesthesia Type: MAC Level of consciousness: awake and alert Pain management: pain level controlled Vital Signs Assessment: post-procedure vital signs reviewed and stable Respiratory status: spontaneous breathing, nonlabored ventilation, respiratory function stable and patient connected to nasal cannula oxygen Cardiovascular status: stable and blood pressure returned to baseline Postop Assessment: no apparent nausea or vomiting Anesthetic complications: no    Last Vitals:  Vitals:   07/26/19 1041 07/26/19 1050  BP: 134/82 127/83  Pulse: 62 61  Resp: 15 14  Temp:    SpO2: 98% 99%    Last Pain:  Vitals:   07/26/19 1050  TempSrc:   PainSc: 0-No pain                 Effie Berkshire

## 2019-08-05 ENCOUNTER — Other Ambulatory Visit: Payer: Self-pay | Admitting: Family Medicine

## 2019-08-05 ENCOUNTER — Ambulatory Visit: Payer: 59 | Admitting: Family Medicine

## 2019-08-05 ENCOUNTER — Encounter: Payer: Self-pay | Admitting: Family Medicine

## 2019-08-05 ENCOUNTER — Ambulatory Visit (INDEPENDENT_AMBULATORY_CARE_PROVIDER_SITE_OTHER): Payer: 59

## 2019-08-05 ENCOUNTER — Other Ambulatory Visit: Payer: Self-pay

## 2019-08-05 VITALS — BP 134/80 | HR 68 | Ht 62.0 in | Wt 159.0 lb

## 2019-08-05 DIAGNOSIS — M999 Biomechanical lesion, unspecified: Secondary | ICD-10-CM | POA: Insufficient documentation

## 2019-08-05 DIAGNOSIS — M79644 Pain in right finger(s): Secondary | ICD-10-CM

## 2019-08-05 DIAGNOSIS — M7061 Trochanteric bursitis, right hip: Secondary | ICD-10-CM

## 2019-08-05 DIAGNOSIS — M65321 Trigger finger, right index finger: Secondary | ICD-10-CM | POA: Diagnosis not present

## 2019-08-05 MED ORDER — GABAPENTIN 300 MG PO CAPS: 300.0000 mg | ORAL_CAPSULE | Freq: Three times a day (TID) | ORAL | 3 refills | Status: DC

## 2019-08-05 MED ORDER — TIZANIDINE HCL 4 MG PO CAPS: ORAL_CAPSULE | ORAL | 0 refills | Status: DC

## 2019-08-05 MED FILL — tiZANidine HCL 4 MG TABS: 4 | 30 days supply | Qty: 30 | Fill #0

## 2019-08-05 MED FILL — GABAPENTIN 300 MG CAPSULE: 300 | 30 days supply | Qty: 90 | Fill #0

## 2019-08-05 NOTE — Patient Instructions (Addendum)
Good to see you  Zanaflex at night and Gabapentin 300 sent in  See me again in 6 - 8 weeks hip should do good

## 2019-08-05 NOTE — Assessment & Plan Note (Signed)

## 2019-08-05 NOTE — Progress Notes (Signed)
[place] [address] [place] Phone: [phone] Subjective:   I Sandra [name] am serving as a Education administrator for Dr. Hulan Saas.  This visit occurred during the SARS-CoV-2 public health emergency.  Safety protocols were in place, including screening questions prior to the visit, additional usage of staff PPE, and extensive cleaning of exam room while observing appropriate contact time as indicated for disinfecting solutions.   I'm seeing this patient by the request  of:  [name], MD  CC: Right hip pain follow-up, back pain follow-up hand pain  [id number]   07/01/2019 Chronic problem.  Mild seroma noted.  Patient given injection, tolerated the procedure well, discussed icing regimen with home exercise, discussed which activities to do which wants to avoid.  Increase activity slowly.  Follow-up again in 4 to 8 weeks  Update 08/05/2019 Sandra [name] is a 59 y.o. female coming in with complaint of right hip pain. Patient states she is not doing well. Last night was painful. States she did some yard work and that she believes that caused her hip pain. Would like first finger right hand injection as well.  Has had trigger finger before.  Did do very well with an injection multiple months ago.  Starting to have it worse could not catch at night again.      Past Medical History:  Diagnosis Date  . Anxiety   . Arthritis   . CAD (coronary artery disease)    nonobstructive CAD in LAD per 2013 CTA coronary morph  . Cancer [facility]) 2006   uterine  . Complication of anesthesia PT STATES HAS LETTER FROM [place] [facility]. STATING THAT SHE HAS A VERY SMALL AIRWAY (LAST SURG. HYSTERECTOMY AT [facility] IN 2006   PT STATES LYNN BEASON CRNA WHOM WORKS HERE TOLD SHE WOULD BE OKAY TO BE DONE AT [facility]  . Depression   . Diabetes mellitus    no meds taken now, checks cbg once or twice a week   . Difficult intubation    had to use the glidescope due to small  airway   . 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 GERD (gastroesophageal reflux disease)   . Heart murmur    with last visit, MD states "outgrown" murmur  . Hemorrhoids, internal, with bleeding and Grade 2 prolapse 03/05/2013   All positions seen an anoscopy RP columns banded 03/05/2013 LL and RA banded 03/18/2013     . History of endometrial cancer 2006 ---- S/P ABD. HYSTERECTOMY   STAGE I ENDOMETROID CARCINOMA  . Hx of adenomatous colonic polyps 2011   BENIGN  . Hyperlipidemia    no meds now  . Hypertension   . Morbid obesity, weight - 203, BMI - 38.4 05/12/2013  . OSA on CPAP    cpap setting of 16  . Sleep apnea    no CPAP since surgery   Past Surgical History:  Procedure Laterality Date  . ABDOMINAL HYSTERECTOMY  12-20-2004   TAH  . BILATERAL BREAST REDUCTION  11-16-1999  . BREATH TEK H PYLORI N/A 05/27/2013   Procedure: BREATH TEK H PYLORI;  Surgeon: Shann Medal, MD;  Location: [facility];  Service: General;  Laterality: N/A;  . [place]  . COLONOSCOPY    . COLONOSCOPY N/A 04/25/2014   Procedure: COLONOSCOPY;  Surgeon: Gatha Mayer, MD;  Location: [facility];  Service: Endoscopy;  Laterality: N/A;  . COLONOSCOPY WITH PROPOFOL N/A 07/26/2019   Procedure:  COLONOSCOPY WITH PROPOFOL;  Surgeon: Gatha Mayer, MD;  Location: [facility];  Service: Endoscopy;  Laterality: N/A;  . CYSTOSCOPY  09/06/2011   Procedure: CYSTOSCOPY;  Surgeon: Ailene Rud, MD;  Location: [facility];  Service: Urology;  Laterality: N/A;  LYNX SLING   . ESOPHAGOGASTRODUODENOSCOPY (EGD) WITH PROPOFOL N/A 09/27/2014   Procedure: ESOPHAGOGASTRODUODENOSCOPY (EGD) WITH PROPOFOL;  Surgeon: Gatha Mayer, MD;  Location: [facility];  Service: Endoscopy;  Laterality: N/A;  . ESOPHAGOGASTRODUODENOSCOPY (EGD) WITH PROPOFOL N/A 03/11/2016   Procedure: ESOPHAGOGASTRODUODENOSCOPY (EGD) WITH PROPOFOL;  Surgeon: Jerene Bears, MD;  Location: [facility];   Service: Endoscopy;  Laterality: N/A;  . GASTRIC BYPASS  10/04/2013  . GASTRIC ROUX-EN-Y N/A 10/04/2013   Procedure: LAPAROSCOPIC ROUX-EN-Y GASTRIC BYPASS WITH UPPER ENDOSCOPY;  Surgeon: Shann Medal, MD;  Location: [facility];  Service: General;  Laterality: N/A;  . HEMORRHOID BANDING  02/2013  . POLYPECTOMY    . POLYPECTOMY  07/26/2019   Procedure: POLYPECTOMY;  Surgeon: Gatha Mayer, MD;  Location: [facility];  Service: Endoscopy;;  . PUBOVAGINAL SLING  09/06/2011   Procedure: Gaynelle Arabian;  Surgeon: Ailene Rud, MD;  Location: [facility];  Service: Urology;  Laterality: N/A;  . REDUCTION MAMMAPLASTY Bilateral 2001  . SHOULDER ARTHROSCOPY Right 10/23/2017   Procedure: RIGHT SHOULDER ARTHROSCOPIC ROTATOR CUFF REPAIR VS. DEBRIDEMENT, SUBACROMIAL DECOMPARESSION, DISTAL CLAVICAL EXCISION;  Surgeon: Tania Ade, MD;  Location: [facility];  Service: Orthopedics;  Laterality: Right;  . UVULOPALATOPHARYNGOPLASTY, TONSILLECTOMY AND SEPTOPLASTY  1994   Social History   Socioeconomic History  . Marital status: Single    Spouse name: Not on file  . Number of children: Not on file  . Years of education: Not on file  . Highest education level: Not on file  Occupational History  . Not on file  Tobacco Use  . Smoking status: Never Smoker  . Smokeless tobacco: Never Used  Substance and Sexual Activity  . Alcohol use: No    Alcohol/week: 0.0 standard drinks  . Drug use: No  . Sexual activity: Not Currently    Birth control/protection: Surgical    Comment: HYST.. 1st intercourse- 13, partners- 6  Other Topics Concern  . 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 Difficulty of Paying Living Expenses:   Food Insecurity:   . Worried About Sales executive in the Last Year:   . Arboriculturist in the Last Year:   Transportation Needs:   . Film/video editor (Medical):   Marland Kitchen Lack of Transportation (Non-Medical):   Physical Activity:   . Days of Exercise per Week:   . Minutes of Exercise per Session:   Stress:   . Feeling of Stress :   Social Connections:   . Frequency of Communication with Friends and Family:   . Frequency of Social Gatherings with Friends and Family:   . Attends Religious Services:   . Active Member of Clubs or Organizations:   . Attends Archivist Meetings:   Marland Kitchen Marital Status:    Allergies  Allergen Reactions  . Nsaids     r/t gastric surgery  . Statins    Family History  Adopted: Yes  Current Outpatient Medications (Hematological):  Marland Kitchen  Cyanocobalamin (B-12) 1000 MCG SUBL, Place 1,000 mcg under the tongue daily.  Current Outpatient Medications (Other):  .  busPIRone (BUSPAR) 5 MG tablet, Take 1 tablet (5 mg total) by mouth 2 (two) times daily. Marland Kitchen  esomeprazole (NEXIUM) 40 MG capsule, Take 1 capsule (40 mg total) by mouth daily as needed (acid reflux). .  gabapentin (NEURONTIN) 100 MG capsule, Take 2 capsules (200 mg total) by mouth at bedtime. Marland Kitchen  glucose blood (FREESTYLE LITE) test strip, Use as instructed .  glucose blood test strip, Use to check blood sugars daily. Marland Kitchen  linaclotide (LINZESS) 145 MCG CAPS capsule, Take 1 capsule (145 mcg total) by mouth daily before breakfast. (Patient taking differently: Take 145 mcg by mouth daily as needed (constipation). ) .  Multiple Vitamins-Minerals (MULTIVITAMIN PO), Take 2 tablets by mouth daily.  .  ondansetron (ZOFRAN-ODT) 4 MG disintegrating tablet, DISSOLVE 1 TABLET BY MOUTH EVERY 8 HOURS AS NEEDED FOR NAUSEA OR VOMITING (MAY TAKE 2 FOR ONE INEFFECTIVE) .  sucralfate (CARAFATE) 1 g tablet, Take 1 tablet (1 g total) by mouth daily as needed (acid). .  Vitamin D, Ergocalciferol, (DRISDOL) 1.25 MG (50000 UNIT) CAPS capsule, TAKE 1 CAPSULE BY  MOUTH EVERY 7 DAYS (Patient taking differently: Take 50,000 Units by mouth every 7 (seven) days. ) .  gabapentin (NEURONTIN) 300 MG capsule, Take 1 capsule (300 mg total) by mouth 3 (three) times daily. Marland Kitchen  tiZANidine (ZANAFLEX) 4 MG capsule, 1 tablet at night   Reviewed prior external information including notes and imaging from  primary care provider As well as notes that were available from care everywhere and other healthcare systems.  Past medical history, social, surgical and family history all reviewed in electronic medical record.  No pertanent information unless stated regarding to the chief complaint.   Review of Systems:  No headache, visual changes, nausea, vomiting, diarrhea, constipation, dizziness, abdominal pain, skin rash, fevers, chills, night sweats, weight loss, swollen lymph nodes, body aches, joint swelling, chest pain, shortness of breath, mood changes. POSITIVE muscle aches  Objective  Blood pressure 134/80, pulse 68, height 5\' 2"  (1.575 m), weight 159 lb (72.1 kg), SpO2 98 %.   General: No apparent distress alert and oriented x3 mood and affect normal, dressed appropriately.  HEENT: Pupils equal, extraocular movements intact  Respiratory: Patient's speak in full sentences and does not appear short of breath  Cardiovascular: No lower extremity edema, non tender, no erythema  Neuro: Cranial nerves II through XII are intact, neurovascularly intact in all extremities with 2+ DTRs and 2+ pulses.  Gait normal with good balance and coordination.  MSK:  Non tender with full range of motion and good stability and symmetric strength and tone of shoulders, elbows, wrist, knee and ankles bilaterally.  Right hip exam still shows that patient does have some mild swelling over the greater trochanteric area.  Tender to palpation in this area.  Positive Corky Sox.  Negative straight leg test.  Low back does have some tenderness to palpation even to the light palpation.  Tightness of the  paraspinal musculature right greater than left.  Negative straight leg test.  Patient's index finger on the right hand shows the patient does have a trigger nodule noted at the A1 pulley with triggering noted today.  Tender to palpation.  Neurovascularly intact distally  Procedure: Real-time Ultrasound Guided Injection of right index finger flexor tendon Device: GE Logiq Q7 Ultrasound guided injection is preferred based studies that show  increased duration, increased effect, greater accuracy, decreased procedural pain, increased response rate, and decreased cost with ultrasound guided versus blind injection.  Verbal informed consent obtained.  Time-out conducted.  Noted no overlying erythema, induration, or other signs of local infection.  Skin prepped in a sterile fashion.  Local anesthesia: Topical Ethyl chloride.  With sterile technique and under real time ultrasound guidance: With a 25-gauge half inch needle injected with 0.5 cc of 0.5% Marcaine and 0.5 cc of Kenalog 40mg /mL.  Osteopathic finding C2 flexed rotated and side bent right C4 flexed rotated and side bent left C6 flexed rotated and side bent left T3 extended rotated and side bent right inhaled third rib T9 extended rotated and side bent left L2 flexed rotated and side bent right Sacrum right on right  Completed without difficulty  Pain immediately resolved suggesting accurate placement of the medication.  Advised to call if fevers/chills, erythema, induration, drainage, or persistent bleeding.  Images permanently stored and available for review in the ultrasound unit.  Impression: Technically successful ultrasound guided injection.   Procedure: Real-time Ultrasound Guided Injection of right greater trochanteric bursitis secondary to patient's body habitus Device: GE Logiq Q7 Ultrasound guided injection is preferred based studies that show increased duration, increased effect, greater accuracy, decreased procedural pain,  increased response rate, and decreased cost with ultrasound guided versus blind injection.  Verbal informed consent obtained.  Time-out conducted.  Noted no overlying erythema, induration, or other signs of local infection.  Skin prepped in a sterile fashion.  Local anesthesia: Topical Ethyl chloride.  With sterile technique and under real time ultrasound guidance:  Greater trochanteric area was visualized and patient's bursa was noted. A 22-gauge 3 inch needle was inserted and 4 cc of 0.5% Marcaine and 1 cc of Kenalog 40 mg/dL was injected. Pictures taken Completed without difficulty  Pain immediately resolved suggesting accurate placement of the medication.  Advised to call if fevers/chills, erythema, induration, drainage, or persistent bleeding.  Images permanently stored and available for review in the ultrasound unit.  Impression: Technically successful ultrasound guided injection.   Impression and Recommendations:     This case required medical decision making of moderate complexity. The above documentation has been reviewed and is accurate and complete Lyndal Pulley, DO       Note: This dictation was prepared with Dragon dictation along with smaller phrase technology. Any transcriptional errors that result from this process are unintentional.

## 2019-08-05 NOTE — Assessment & Plan Note (Signed)
Status post hematoma and bone contusion.  Extra injections given today, tolerated procedure well, discussed icing regimen, which activities to doing which wants to avoid.  Increase activity slowly.  Follow-up again in 4 to 8 weeks

## 2019-08-05 NOTE — Assessment & Plan Note (Signed)
Repeat injection given today, tolerated the procedure well, will discuss with bracing at night, discussed the repetitive activity.  2 injections within a 36-month period.  We will need to consider the possibility of surgical intervention if needed greater than 30.

## 2019-09-06 ENCOUNTER — Other Ambulatory Visit: Payer: Self-pay | Admitting: Family Medicine

## 2019-09-16 ENCOUNTER — Ambulatory Visit: Payer: 59 | Admitting: Family Medicine

## 2019-09-16 NOTE — Progress Notes (Deleted)
[place] [address] [place] Phone: [phone] Subjective:    I'm seeing this patient by the request  of:  Burns, Claudina Lick, MD  CC:   [id number]  Sandra [name] is a 59 y.o. female coming in with complaint of ***  Onset-  Location Duration-  Character- Aggravating factors- Reliving factors-  Therapies tried-  Severity-     Past Medical History:  Diagnosis Date  . Anxiety   . Arthritis   . CAD (coronary artery disease)    nonobstructive CAD in LAD per 2013 CTA coronary morph  . Cancer [facility]) 2006   uterine  . Complication of anesthesia PT STATES HAS LETTER FROM [place] [facility]. STATING THAT SHE HAS A VERY SMALL AIRWAY (LAST SURG. HYSTERECTOMY AT [facility] IN 2006   PT STATES LYNN BEASON CRNA WHOM WORKS HERE TOLD SHE WOULD BE OKAY TO BE DONE AT [facility]  . Depression   . Diabetes mellitus    no meds taken now, checks cbg once or twice a week   . Difficult intubation    had to use the glidescope due to small airway   . 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 GERD (gastroesophageal reflux disease)   . Heart murmur    with last visit, MD states "outgrown" murmur  . Hemorrhoids, internal, with bleeding and Grade 2 prolapse 03/05/2013   All positions seen an anoscopy RP columns banded 03/05/2013 LL and RA banded 03/18/2013     . History of endometrial cancer 2006 ---- S/P ABD. HYSTERECTOMY   STAGE I ENDOMETROID CARCINOMA  . Hx of adenomatous colonic polyps 2011   BENIGN  . Hyperlipidemia    no meds now  . Hypertension   . Morbid obesity, weight - 203, BMI - 38.4 05/12/2013  . OSA on CPAP    cpap setting of 16  . Sleep apnea    no CPAP since surgery   Past Surgical History:  Procedure Laterality Date  . ABDOMINAL HYSTERECTOMY  12-20-2004   TAH  . BILATERAL BREAST REDUCTION  11-16-1999  . BREATH TEK H PYLORI N/A 05/27/2013   Procedure: BREATH TEK H PYLORI;  Surgeon: Shann Medal, MD;  [facility];  Service: General;  Laterality: N/A;  . [place]  . COLONOSCOPY    . COLONOSCOPY N/A 04/25/2014   Procedure: COLONOSCOPY;  Surgeon: Gatha Mayer, MD;  Location: [facility];  Service: Endoscopy;  Laterality: N/A;  . COLONOSCOPY WITH PROPOFOL N/A 07/26/2019   Procedure: COLONOSCOPY WITH PROPOFOL;  Surgeon: Gatha Mayer, MD;  Location: [facility];  Service: Endoscopy;  Laterality: N/A;  . CYSTOSCOPY  09/06/2011   Procedure: CYSTOSCOPY;  Surgeon: Ailene Rud, MD;  Location[facility];  Service: Urology;  Laterality: N/A;  LYNX SLING   . ESOPHAGOGASTRODUODENOSCOPY (EGD) WITH PROPOFOL N/A 09/27/2014   Procedure: ESOPHAGOGASTRODUODENOSCOPY (EGD) WITH PROPOFOL;  Surgeon: Gatha Mayer, MD;  Location: [facility];  Service: Endoscopy;  Laterality: N/A;  . ESOPHAGOGASTRODUODENOSCOPY (EGD) WITH PROPOFOL N/A 03/11/2016   Procedure: ESOPHAGOGASTRODUODENOSCOPY (EGD) WITH PROPOFOL;  Surgeon: Jerene Bears, MD;  Location: [facility];  Service: Endoscopy;  Laterality: N/A;  . GASTRIC BYPASS  10/04/2013  . GASTRIC ROUX-EN-Y N/A 10/04/2013   Procedure: LAPAROSCOPIC ROUX-EN-Y GASTRIC BYPASS WITH UPPER ENDOSCOPY;  Surgeon: Shann Medal, MD;  Location: [facility];  Service: General;  Laterality: N/A;  . HEMORRHOID BANDING  02/2013  . POLYPECTOMY    .  POLYPECTOMY  07/26/2019   Procedure: POLYPECTOMY;  Surgeon: Gatha Mayer, MD;  Location[facility];  Service: Endoscopy;;  . PUBOVAGINAL SLING  09/06/2011   Procedure: Gaynelle Arabian;  Surgeon: Ailene Rud, MD;  Location[facility];  Service: Urology;  Laterality: N/A;  . REDUCTION MAMMAPLASTY Bilateral 2001  . SHOULDER ARTHROSCOPY Right 10/23/2017   Procedure: RIGHT SHOULDER ARTHROSCOPIC ROTATOR CUFF REPAIR VS. DEBRIDEMENT, SUBACROMIAL DECOMPARESSION, DISTAL CLAVICAL EXCISION;  Surgeon: Tania Ade, MD;  Location: [place];  Service: Orthopedics;  Laterality: Right;  .  UVULOPALATOPHARYNGOPLASTY, TONSILLECTOMY AND SEPTOPLASTY  1994   Social History   Socioeconomic History  . Marital status: Single    Spouse name: Not on file  . Number of children: Not on file  . Years of education: Not on file  . Highest education level: Not on file  Occupational History  . Not on file  Tobacco Use  . Smoking status: Never Smoker  . Smokeless tobacco: Never Used  Substance and Sexual Activity  . Alcohol use: No    Alcohol/week: 0.0 standard drinks  . Drug use: No  . Sexual activity: Not Currently    Birth control/protection: Surgical    Comment: HYST.. 1st intercourse- 13, partners- 6  Other Topics Concern  . 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 Difficulty of Paying Living Expenses:   Food Insecurity:   . Worried About Charity fundraiser in the Last Year:   . Arboriculturist in the Last Year:   Transportation Needs:   . Film/video editor (Medical):   Marland Kitchen Lack of Transportation (Non-Medical):   Physical Activity:   . Days of Exercise per Week:   . Minutes of Exercise per Session:   Stress:   . Feeling of Stress :   Social Connections:   . Frequency of Communication with Friends and Family:   . Frequency of Social Gatherings with Friends and Family:   . Attends Religious Services:   . Active Member of Clubs or Organizations:   . Attends Archivist Meetings:   Marland Kitchen Marital Status:    Allergies  Allergen Reactions  . Nsaids     r/t gastric surgery  . Statins    Family History  Adopted: Yes        Current Outpatient Medications (Hematological):  Marland Kitchen  Cyanocobalamin (B-12) 1000 MCG SUBL, Place 1,000 mcg under the tongue daily.  Current Outpatient Medications (Other):  .  busPIRone (BUSPAR) 5 MG tablet, Take 1 tablet  (5 mg total) by mouth 2 (two) times daily. Marland Kitchen  esomeprazole (NEXIUM) 40 MG capsule, Take 1 capsule (40 mg total) by mouth daily as needed (acid reflux). .  gabapentin (NEURONTIN) 100 MG capsule, Take 2 capsules (200 mg total) by mouth at bedtime. .  gabapentin (NEURONTIN) 300 MG capsule, Take 1 capsule (300 mg total) by mouth 3 (three) times daily. Marland Kitchen  glucose blood (FREESTYLE LITE) test strip, Use as instructed .  glucose blood test strip, Use to check blood sugars daily. Marland Kitchen  linaclotide (LINZESS) 145 MCG CAPS capsule, Take 1 capsule (145 mcg total) by mouth daily before breakfast. (Patient taking differently: Take 145 mcg by mouth daily as needed (constipation). ) .  Multiple Vitamins-Minerals (MULTIVITAMIN PO), Take 2  tablets by mouth daily.  .  ondansetron (ZOFRAN-ODT) 4 MG disintegrating tablet, DISSOLVE 1 TABLET BY MOUTH EVERY 8 HOURS AS NEEDED FOR NAUSEA OR VOMITING (MAY TAKE 2 FOR ONE INEFFECTIVE) .  sucralfate (CARAFATE) 1 g tablet, Take 1 tablet (1 g total) by mouth daily as needed (acid). Marland Kitchen  tiZANidine (ZANAFLEX) 4 MG tablet, TAKE 1 TABLET BY MOUTH AT NIGHT .  Vitamin D, Ergocalciferol, (DRISDOL) 1.25 MG (50000 UNIT) CAPS capsule, TAKE 1 CAPSULE BY MOUTH EVERY 7 DAYS (Patient taking differently: Take 50,000 Units by mouth every 7 (seven) days. )   Reviewed prior external information including notes and imaging from  primary care provider As well as notes that were available from care everywhere and other healthcare systems.  Past medical history, social, surgical and family history all reviewed in electronic medical record.  No pertanent information unless stated regarding to the chief complaint.   Review of Systems:  No headache, visual changes, nausea, vomiting, diarrhea, constipation, dizziness, abdominal pain, skin rash, fevers, chills, night sweats, weight loss, swollen lymph nodes, body aches, joint swelling, chest pain, shortness of breath, mood changes. POSITIVE muscle  aches  Objective  There were no vitals taken for this visit.   General: No apparent distress alert and oriented x3 mood and affect normal, dressed appropriately.  HEENT: Pupils equal, extraocular movements intact  Respiratory: Patient's speak in full sentences and does not appear short of breath  Cardiovascular: No lower extremity edema, non tender, no erythema  Neuro: Cranial nerves II through XII are intact, neurovascularly intact in all extremities with 2+ DTRs and 2+ pulses.  Gait normal with good balance and coordination.  MSK:  Non tender with full range of motion and good stability and symmetric strength and tone of shoulders, elbows, wrist, hip, knee and ankles bilaterally.     Impression and Recommendations:     This case required medical decision making of moderate complexity. The above documentation has been reviewed and is accurate and complete Lyndal Pulley, DO       Note: This dictation was prepared with Dragon dictation along with smaller phrase technology. Any transcriptional errors that result from this process are unintentional.

## 2019-09-29 ENCOUNTER — Telehealth: Payer: 59 | Admitting: Nurse Practitioner

## 2019-09-29 DIAGNOSIS — R21 Rash and other nonspecific skin eruption: Secondary | ICD-10-CM | POA: Diagnosis not present

## 2019-09-29 DIAGNOSIS — W57XXXA Bitten or stung by nonvenomous insect and other nonvenomous arthropods, initial encounter: Secondary | ICD-10-CM | POA: Diagnosis not present

## 2019-09-29 MED ORDER — DOXYCYCLINE HYCLATE 100 MG PO TABS: 100.0000 mg | ORAL_TABLET | Freq: Two times a day (BID) | ORAL | 0 refills | Status: DC

## 2019-09-29 MED FILL — DOXYCYCLINE HYCLATE 100 MG: 100 | 21 days supply | Qty: 42 | Fill #0

## 2019-09-29 NOTE — Progress Notes (Signed)
Thank you for describing your tick bite, Here is how we plan to help! Based on the information that you shared with me it looks like you have An infected or complicated tick bite that requires a longer course of antibiotics and will need for you to schedule a follow-up visit with a provider.  In most cases a tick bite is painless and does not itch.  Most tick bites in which the tick is quickly removed do not require prescriptions. Ticks can transmit several diseases if they are infected and remain attacked to your skin. Therefore the length that the tick was attached and any symptoms you have experienced after the bite are import to accurately develop your custom treatment plan. In most cases a single dose of doxycycline may prevent the development of a more serious condition.  Based on your information I have Provided a home care guide for tick bites and  instructions on when to call for help. and Your symptoms indicate that you need a longer course of antibiotics and a follow up visit with a provider. I have sent doxycycline 100 mg twice a day for 21 days to the pharmacy that you selected. You will need to schedule a follow up visit with your provider. If you do not have a primary care provider you may use our telehealth physicians on the web at [email or web address]  Which ticks  are associated with illness?  The Wood Tick (dog tick) is the size of a watermelon seed and can sometimes transmit Rocky Mountain spotted fever and [place] tick fever.   The Deer Tick (black-legged tick) is between the size of a poppy seed (pin head) and an apple seed, and can sometimes transmit Lyme disease.  A brown to black tick with a white splotch on its back is likely a female Amblyomma americanum (Lone Star tick). This tick has been associated with Southern Tick Associated illness ( STARI)  Lyme disease has become the most common tick-borne illness in the [place]. The risk of Lyme disease following a  recognized deer tick bite is estimated to be 1%.  The majority of cases of Lyme disease start with a bull's eye rash at the site of the tick bite. The rash can occur days to weeks (typically 7-10 days) after a tick bite. Treatment with antibiotics is indicated if this rash appears. Flu-like symptoms may accompany the rash, including: fever, chills, headaches, muscle aches, and fatigue. Removing ticks promptly may prevent tick borne disease.  What can be used to prevent Tick Bites?   Insect repellant with at leas 20% DEET.  Wearing long pants with sock and shoes.  Avoiding tall grass and heavily wooded areas.  Checking your skin after being outdoors.  Shower with a washcloth after outdoor exposures.  HOME CARE ADVICE FOR TICK BITE  1. Wood Tick Removal:  o Use a pair of tweezers and grasp the wood tick close to the skin (on its head). Pull the wood tick straight upward without twisting or crushing it. Maintain a steady pressure until it releases its grip.   o If tweezers aren't available, use fingers, a loop of thread around the jaws, or a needle between the jaws for traction.  o Note: covering the tick with petroleum jelly, nail polish or rubbing alcohol doesn't work. Neither does touching the tick with a hot or cold object. 2. Tiny Deer Tick Removal:   o Needs to be scraped off with a knife blade or credit card edge. o Place tick   in a sealed container (e.g. glass jar, zip lock plastic bag), in case your doctor wants to see it. 3. Tick's Head Removal:  o If the wood tick's head breaks off in the skin, it must be removed. Clean the skin. Then use a sterile needle to uncover the head and lift it out or scrape it off.  o If a very small piece of the head remains, the skin will eventually slough it off. 4. Antibiotic Ointment:  o Wash the wound and your hands with soap and water after removal to prevent catching any tick disease.  Apply an over the counter antibiotic ointment (e.g.  bacitracin) to the bite once. 5. Expected Course: Tick bites normally don't itch or hurt. That's why they often go unnoticed. 6. Call Your Doctor If:  o You can't remove the tick or the tick's head o Fever, a severe head ache, or rash occur in the next 2 weeks o Bite begins to look infected o Lyme's disease is common in your area o You have not had a tetanus in the last 10 years o Your current symptoms become worse    MAKE SURE YOU   Understand these instructions.  Will watch your condition.  Will get help right away if you are not doing well or get worse.   Thank you for choosing an e-visit.  Your e-visit answers were reviewed by a board certified advanced clinical practitioner to complete your personal care plan. Depending upon the condition, your plan could have included both over the counter or prescription medications. Please review your pharmacy choice. If there is a problem you may use MyChart messaging to have the prescription routed to another pharmacy. Your safety is important to us. If you have drug allergies check your prescription carefully.   You can use MyChart to ask questions about today's visit, request a non-urgent call back, or ask for a work or school excuse for 24 hours related to this e-Visit. If it has been greater than 24 hours you will need to follow up with your provider, or enter a new e-Visit to address those concerns.  You will get an email in the next two days asking about your experience. I hope  that your e-visit has been valuable and will speed your recovery   5-10 minutes spent reviewing and documenting in chart.  

## 2019-10-05 ENCOUNTER — Other Ambulatory Visit: Payer: Self-pay | Admitting: Family Medicine

## 2019-10-07 ENCOUNTER — Telehealth: Payer: Self-pay | Admitting: Internal Medicine

## 2019-10-07 NOTE — Telephone Encounter (Signed)
Sent rx but she is overdue for a f/u.  Please schedule her for follow-up.

## 2019-10-07 NOTE — Telephone Encounter (Signed)
Patient scheduled for Monday 10/11/2019 at 2:30pm.

## 2019-10-07 NOTE — Telephone Encounter (Signed)
Pls advise if ok to refill../lmb 

## 2019-10-07 NOTE — Telephone Encounter (Signed)
Forwarding msg to schedulers to make follow-up appt per MD../lmb

## 2019-10-10 NOTE — Patient Instructions (Signed)
  Blood work was ordered.     Medications reviewed and updated.  Changes include :     Your prescription(s) have been submitted to your pharmacy. Please take as directed and contact our office if you believe you are having problem(s) with the medication(s).  A referral was ordered for        Someone will call you to schedule this.    Please followup in 6 months   

## 2019-10-10 NOTE — Progress Notes (Signed)
Subjective:    Patient ID: Sandra [name], female    DOB: 1960/11/25, 59 y.o.   MRN: [id number]  HPI The patient is here for follow up of their chronic medical problems, including DM, anxiety, depression   No longer on lexparo and wellbutrin.  She takes buspar      Medications and allergies reviewed with patient and updated if appropriate.  Patient Active Problem List   Diagnosis Date Noted  . Nonallopathic lesion of sacral region 08/05/2019  . Greater trochanteric bursitis, right 08/05/2019  . Benign neoplasm of descending colon   . Low back pain 05/27/2019  . Hematoma of right hip 05/07/2019  . Trigger finger, right index finger 02/17/2019  . Anxiety and depression 11/12/2018  . Left lateral epicondylitis 05/14/2018  . Left elbow pain 05/04/2018  . Elevated blood pressure reading 09/06/2017  . Right rotator cuff tear 08/20/2017  . Cervical disc disorder with radiculopathy of cervical region 04/25/2016  . Nonallopathic lesion of cervical region 04/17/2016  . Nonallopathic lesion of thoracic region 04/17/2016  . Nonallopathic lesion of rib cage 04/17/2016  . Nonallopathic lesion of lumbosacral region 04/17/2016  . Degenerative cervical disc 03/29/2016  . Degenerative arthritis of right knee 03/29/2016  . 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 Adjustment disorder with mixed anxiety and depressed mood 07/14/2014  . Personal history of colonic polyps   . 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 Chronic constipation 03/05/2013  . Coronary atherosclerosis of native coronary artery 08/27/2011  . History of uterine cancer 08/15/2011  . Diabetes ([place]) 08/15/2011  . Hypercholesterolemia 08/15/2011  . History of colonic polyps 12/29/2009    Current Outpatient Medications on File Prior to Visit  Medication Sig Dispense Refill  . busPIRone (BUSPAR) 5 MG tablet TAKE 1 TABLET BY MOUTH 3 TIMES DAILY 90 tablet 0  . Cyanocobalamin (B-12) 1000 MCG SUBL Place  1,000 mcg under the tongue daily.    Marland Kitchen doxycycline (VIBRA-TABS) 100 MG tablet Take 1 tablet (100 mg total) by mouth 2 (two) times daily. 1 po bid 42 tablet 0  . esomeprazole (NEXIUM) 40 MG capsule Take 1 capsule (40 mg total) by mouth daily as needed (acid reflux).    . gabapentin (NEURONTIN) 100 MG capsule Take 2 capsules (200 mg total) by mouth at bedtime. 180 capsule 3  . gabapentin (NEURONTIN) 300 MG capsule Take 1 capsule (300 mg total) by mouth 3 (three) times daily. 90 capsule 3  . glucose blood (FREESTYLE LITE) test strip Use as instructed 100 each 12  . glucose blood test strip Use to check blood sugars daily. 100 each 3  . linaclotide (LINZESS) 145 MCG CAPS capsule Take 1 capsule (145 mcg total) by mouth daily before breakfast. (Patient taking differently: Take 145 mcg by mouth daily as needed (constipation). ) 30 capsule 11  . Multiple Vitamins-Minerals (MULTIVITAMIN PO) Take 2 tablets by mouth daily.     . ondansetron (ZOFRAN-ODT) 4 MG disintegrating tablet DISSOLVE 1 TABLET BY MOUTH EVERY 8 HOURS AS NEEDED FOR NAUSEA OR VOMITING (MAY TAKE 2 FOR ONE INEFFECTIVE) 30 tablet 3  . sucralfate (CARAFATE) 1 g tablet Take 1 tablet (1 g total) by mouth daily as needed (acid).    Marland Kitchen tiZANidine (ZANAFLEX) 4 MG tablet TAKE 1 TABLET BY MOUTH NIGHTLY 30 tablet 0  . Vitamin D, Ergocalciferol, (DRISDOL) 1.25 MG (50000 UNIT) CAPS capsule TAKE 1 CAPSULE BY MOUTH EVERY 7 DAYS (Patient taking differently: Take 50,000 Units by mouth every 7 (seven) days. )  12 capsule 0   No current facility-administered medications on file prior to visit.    Past Medical History:  Diagnosis Date  . Anxiety   . Arthritis   . CAD (coronary artery disease)    nonobstructive CAD in LAD per 2013 CTA coronary morph  . Cancer [facility]) 2006   uterine  . Complication of anesthesia PT STATES HAS LETTER FROM [place] [facility]. STATING THAT SHE HAS A VERY SMALL AIRWAY (LAST SURG. HYSTERECTOMY AT [facility] IN 2006   PT STATES LYNN BEASON CRNA  WHOM WORKS HERE TOLD SHE WOULD BE OKAY TO BE DONE AT [facility]  . Depression   . Diabetes mellitus    no meds taken now, checks cbg once or twice a week   . Difficult intubation    had to use the glidescope due to small airway   . 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 GERD (gastroesophageal reflux disease)   . Heart murmur    with last visit, MD states "outgrown" murmur  . Hemorrhoids, internal, with bleeding and Grade 2 prolapse 03/05/2013   All positions seen an anoscopy RP columns banded 03/05/2013 LL and RA banded 03/18/2013     . History of endometrial cancer 2006 ---- S/P ABD. HYSTERECTOMY   STAGE I ENDOMETROID CARCINOMA  . Hx of adenomatous colonic polyps 2011   BENIGN  . Hyperlipidemia    no meds now  . Hypertension   . Morbid obesity, weight - 203, BMI - 38.4 05/12/2013  . OSA on CPAP    cpap setting of 16  . Sleep apnea    no CPAP since surgery    Past Surgical History:  Procedure Laterality Date  . ABDOMINAL HYSTERECTOMY  12-20-2004   TAH  . BILATERAL BREAST REDUCTION  11-16-1999  . BREATH TEK H PYLORI N/A 05/27/2013   Procedure: BREATH TEK H PYLORI;  Surgeon: Shann Medal, MD;  [facility];  Service: General;  Laterality: N/A;  . [place]  . COLONOSCOPY    . COLONOSCOPY N/A 04/25/2014   Procedure: COLONOSCOPY;  Surgeon: Gatha Mayer, MD;  [facility]: [facility];  Service: Endoscopy;  Laterality: N/A;  . COLONOSCOPY WITH PROPOFOL N/A 07/26/2019   Procedure: COLONOSCOPY WITH PROPOFOL;  Surgeon: Gatha Mayer, MD;  [facility];  Service: Endoscopy;  Laterality: N/A;  . CYSTOSCOPY  09/06/2011   Procedure: CYSTOSCOPY;  Surgeon: Ailene Rud, MD;  Location: [facility];  Service: Urology;  Laterality: N/A;  LYNX SLING   . ESOPHAGOGASTRODUODENOSCOPY (EGD) WITH PROPOFOL N/A 09/27/2014   Procedure: ESOPHAGOGASTRODUODENOSCOPY (EGD) WITH PROPOFOL;  Surgeon: Gatha Mayer, MD;  [facility];   Service: Endoscopy;  Laterality: N/A;  . ESOPHAGOGASTRODUODENOSCOPY (EGD) WITH PROPOFOL N/A 03/11/2016   Procedure: ESOPHAGOGASTRODUODENOSCOPY (EGD) WITH PROPOFOL;  Surgeon: Jerene Bears, MD;  [facility];  Service: Endoscopy;  Laterality: N/A;  . GASTRIC BYPASS  10/04/2013  . GASTRIC ROUX-EN-Y N/A 10/04/2013   Procedure: LAPAROSCOPIC ROUX-EN-Y GASTRIC BYPASS WITH UPPER ENDOSCOPY;  Surgeon: Shann Medal, MD;  [facility];  Service: General;  Laterality: N/A;  . HEMORRHOID BANDING  02/2013  . POLYPECTOMY    . POLYPECTOMY  07/26/2019   Procedure: POLYPECTOMY;  Surgeon: Gatha Mayer, MD;  Location: [facility];  Service: Endoscopy;;  . PUBOVAGINAL SLING  09/06/2011   Procedure: Gaynelle Arabian;  Surgeon: Ailene Rud, MD;  Location[facility];  Service: Urology;  Laterality: N/A;  . REDUCTION MAMMAPLASTY  Bilateral 2001  . SHOULDER ARTHROSCOPY Right 10/23/2017   Procedure: RIGHT SHOULDER ARTHROSCOPIC ROTATOR CUFF REPAIR VS. DEBRIDEMENT, SUBACROMIAL DECOMPARESSION, DISTAL CLAVICAL EXCISION;  Surgeon: Tania Ade, MD;  Location: [place];  Service: Orthopedics;  Laterality: Right;  . UVULOPALATOPHARYNGOPLASTY, TONSILLECTOMY AND SEPTOPLASTY  1994    Social History   Socioeconomic History  . Marital status: Single    Spouse name: Not on file  . Number of children: Not on file  . Years of education: Not on file  . Highest education level: Not on file  Occupational History  . Not on file  Tobacco Use  . Smoking status: Never Smoker  . Smokeless tobacco: Never Used  Vaping Use  . Vaping Use: Never used  Substance and Sexual Activity  . Alcohol use: No    Alcohol/week: 0.0 standard drinks  . Drug use: No  . Sexual activity: Not Currently    Birth control/protection: Surgical    Comment: HYST.. 1st intercourse- 13, partners- 6  Other Topics Concern  . 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 Difficulty of Paying Living Expenses:   Food Insecurity:   . Worried About Charity fundraiser in the Last Year:   . Arboriculturist in the Last Year:   Transportation Needs:   . Film/video editor (Medical):   Marland Kitchen Lack of Transportation (Non-Medical):   Physical Activity:   . Days of Exercise per Week:   . Minutes of Exercise per Session:   Stress:   . Feeling of Stress :   Social Connections:   . Frequency of Communication with Friends and Family:   . Frequency of Social Gatherings with Friends and Family:   . Attends Religious Services:   . Active Member of Clubs or Organizations:   . Attends Archivist Meetings:   Marland Kitchen Marital Status:     Family History  Adopted: Yes    Review of Systems     Objective:  There were no vitals filed for this visit. BP Readings from Last 3 Encounters:  08/05/19 134/80  07/26/19 127/83  07/01/19 140/82   Wt Readings from Last 3 Encounters:  08/05/19 159 lb (72.1 kg)  07/26/19 150 lb (68 kg)  07/01/19 150 lb (68 kg)   There is no height or weight on file to calculate BMI.   Physical Exam    Constitutional: Appears well-developed and well-nourished. No distress.  HENT:  Head: Normocephalic and atraumatic.  Neck: Neck supple. No tracheal deviation present. No thyromegaly present.  No cervical lymphadenopathy Cardiovascular: Normal rate, regular rhythm and normal heart sounds.   No murmur heard. No carotid bruit .  No edema Pulmonary/Chest: Effort normal and breath sounds normal. No respiratory distress. No has no wheezes. No rales.  Skin: Skin is warm and dry. Not diaphoretic.  Psychiatric: Normal mood and affect. Behavior is normal.      Assessment & Plan:    See Problem List for Assessment and Plan of chronic medical problems.    This visit  occurred during the SARS-CoV-2 public health emergency.  Safety protocols were in place, including screening questions prior to the visit, additional usage of staff PPE, and extensive cleaning of exam room while observing appropriate contact time as indicated for disinfecting  solutions.    This encounter was created in error - please disregard.

## 2019-10-11 ENCOUNTER — Encounter: Payer: 59 | Admitting: Internal Medicine

## 2019-10-11 ENCOUNTER — Ambulatory Visit: Payer: 59 | Admitting: Family Medicine

## 2019-10-11 NOTE — Progress Notes (Deleted)
[place] [address] [place] Phone: [phone] Subjective:    I'm seeing this patient by the request  of:  Burns, Claudina Lick, MD  CC:   [id number]  Sandra [name] is a 59 y.o. female coming in with complaint of ***  Onset-  Location Duration-  Character- Aggravating factors- Reliving factors-  Therapies tried-  Severity-     Past Medical History:  Diagnosis Date  . Anxiety   . Arthritis   . CAD (coronary artery disease)    nonobstructive CAD in LAD per 2013 CTA coronary morph  . Cancer [facility]) 2006   uterine  . Complication of anesthesia PT STATES HAS LETTER FROM [place] [facility]. STATING THAT SHE HAS A VERY SMALL AIRWAY (LAST SURG. HYSTERECTOMY AT [facility] IN 2006   PT STATES LYNN BEASON CRNA WHOM WORKS HERE TOLD SHE WOULD BE OKAY TO BE DONE AT [facility]  . Depression   . Diabetes mellitus    no meds taken now, checks cbg once or twice a week   . Difficult intubation    had to use the glidescope due to small airway   . 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 GERD (gastroesophageal reflux disease)   . Heart murmur    with last visit, MD states "outgrown" murmur  . Hemorrhoids, internal, with bleeding and Grade 2 prolapse 03/05/2013   All positions seen an anoscopy RP columns banded 03/05/2013 LL and RA banded 03/18/2013     . History of endometrial cancer 2006 ---- S/P ABD. HYSTERECTOMY   STAGE I ENDOMETROID CARCINOMA  . Hx of adenomatous colonic polyps 2011   BENIGN  . Hyperlipidemia    no meds now  . Hypertension   . Morbid obesity, weight - 203, BMI - 38.4 05/12/2013  . OSA on CPAP    cpap setting of 16  . Sleep apnea    no CPAP since surgery   Past Surgical History:  Procedure Laterality Date  . ABDOMINAL HYSTERECTOMY  12-20-2004   TAH  . BILATERAL BREAST REDUCTION  11-16-1999  . BREATH TEK H PYLORI N/A 05/27/2013   Procedure: BREATH TEK H PYLORI;  Surgeon: Shann Medal, MD;  [facility];  Service: General;  Laterality: N/A;  . [place]  . COLONOSCOPY    . COLONOSCOPY N/A 04/25/2014   Procedure: COLONOSCOPY;  Surgeon: Gatha Mayer, MD;  [facility];  Service: Endoscopy;  Laterality: N/A;  . COLONOSCOPY WITH PROPOFOL N/A 07/26/2019   Procedure: COLONOSCOPY WITH PROPOFOL;  Surgeon: Gatha Mayer, MD;  [facility];  Service: Endoscopy;  Laterality: N/A;  . CYSTOSCOPY  09/06/2011   Procedure: CYSTOSCOPY;  Surgeon: Ailene Rud, MD;  Location[facility];  Service: Urology;  Laterality: N/A;  LYNX SLING   . ESOPHAGOGASTRODUODENOSCOPY (EGD) WITH PROPOFOL N/A 09/27/2014   Procedure: ESOPHAGOGASTRODUODENOSCOPY (EGD) WITH PROPOFOL;  Surgeon: Gatha Mayer, MD;  [facility];  Service: Endoscopy;  Laterality: N/A;  . ESOPHAGOGASTRODUODENOSCOPY (EGD) WITH PROPOFOL N/A 03/11/2016   Procedure: ESOPHAGOGASTRODUODENOSCOPY (EGD) WITH PROPOFOL;  Surgeon: Jerene Bears, MD;  [facility];  Service: Endoscopy;  Laterality: N/A;  . GASTRIC BYPASS  10/04/2013  . GASTRIC ROUX-EN-Y N/A 10/04/2013   Procedure: LAPAROSCOPIC ROUX-EN-Y GASTRIC BYPASS WITH UPPER ENDOSCOPY;  Surgeon: Shann Medal, MD;  [facility];  Service: General;  Laterality: N/A;  . HEMORRHOID BANDING  02/2013  . POLYPECTOMY    .  POLYPECTOMY  07/26/2019   Procedure: POLYPECTOMY;  Surgeon: Gatha Mayer, MD;  [facility];  Service: Endoscopy;;  . PUBOVAGINAL SLING  09/06/2011   Procedure: Gaynelle Arabian;  Surgeon: Ailene Rud, MD;  Location: [facility];  Service: Urology;  Laterality: N/A;  . REDUCTION MAMMAPLASTY Bilateral 2001  . SHOULDER ARTHROSCOPY Right 10/23/2017   Procedure: RIGHT SHOULDER ARTHROSCOPIC ROTATOR CUFF REPAIR VS. DEBRIDEMENT, SUBACROMIAL DECOMPARESSION, DISTAL CLAVICAL EXCISION;  Surgeon: Tania Ade, MD;  Location: [place];  Service: Orthopedics;  Laterality: Right;  .  UVULOPALATOPHARYNGOPLASTY, TONSILLECTOMY AND SEPTOPLASTY  1994   Social History   Socioeconomic History  . Marital status: Single    Spouse name: Not on file  . Number of children: Not on file  . Years of education: Not on file  . Highest education level: Not on file  Occupational History  . Not on file  Tobacco Use  . Smoking status: Never Smoker  . Smokeless tobacco: Never Used  Vaping Use  . Vaping Use: Never used  Substance and Sexual Activity  . Alcohol use: No    Alcohol/week: 0.0 standard drinks  . Drug use: No  . Sexual activity: Not Currently    Birth control/protection: Surgical    Comment: HYST.. 1st intercourse- 13, partners- 6  Other Topics Concern  . 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 Difficulty of Paying Living Expenses:   Food Insecurity:   . Worried About Charity fundraiser in the Last Year:   . Arboriculturist in the Last Year:   Transportation Needs:   . Film/video editor (Medical):   Marland Kitchen Lack of Transportation (Non-Medical):   Physical Activity:   . Days of Exercise per Week:   . Minutes of Exercise per Session:   Stress:   . Feeling of Stress :   Social Connections:   . Frequency of Communication with Friends and Family:   . Frequency of Social Gatherings with Friends and Family:   . Attends Religious Services:   . Active Member of Clubs or Organizations:   . Attends Archivist Meetings:   Marland Kitchen Marital Status:    Allergies  Allergen Reactions  . Nsaids     r/t gastric surgery  . Statins    Family History  Adopted: Yes        Current Outpatient Medications (Hematological):  Marland Kitchen  Cyanocobalamin (B-12) 1000 MCG SUBL, Place 1,000 mcg under the tongue daily.  Current Outpatient Medications (Other):  .   busPIRone (BUSPAR) 5 MG tablet, TAKE 1 TABLET BY MOUTH 3 TIMES DAILY .  doxycycline (VIBRA-TABS) 100 MG tablet, Take 1 tablet (100 mg total) by mouth 2 (two) times daily. 1 po bid .  esomeprazole (NEXIUM) 40 MG capsule, Take 1 capsule (40 mg total) by mouth daily as needed (acid reflux). .  gabapentin (NEURONTIN) 100 MG capsule, Take 2 capsules (200 mg total) by mouth at bedtime. .  gabapentin (NEURONTIN) 300 MG capsule, Take 1 capsule (300 mg total) by mouth 3 (three) times daily. Marland Kitchen  glucose blood (FREESTYLE LITE) test strip, Use as instructed .  glucose blood test strip, Use to check blood sugars daily. Marland Kitchen  linaclotide (LINZESS) 145 MCG CAPS capsule, Take 1 capsule (145 mcg  total) by mouth daily before breakfast. (Patient taking differently: Take 145 mcg by mouth daily as needed (constipation). ) .  Multiple Vitamins-Minerals (MULTIVITAMIN PO), Take 2 tablets by mouth daily.  .  ondansetron (ZOFRAN-ODT) 4 MG disintegrating tablet, DISSOLVE 1 TABLET BY MOUTH EVERY 8 HOURS AS NEEDED FOR NAUSEA OR VOMITING (MAY TAKE 2 FOR ONE INEFFECTIVE) .  sucralfate (CARAFATE) 1 g tablet, Take 1 tablet (1 g total) by mouth daily as needed (acid). Marland Kitchen  tiZANidine (ZANAFLEX) 4 MG tablet, TAKE 1 TABLET BY MOUTH NIGHTLY .  Vitamin D, Ergocalciferol, (DRISDOL) 1.25 MG (50000 UNIT) CAPS capsule, TAKE 1 CAPSULE BY MOUTH EVERY 7 DAYS (Patient taking differently: Take 50,000 Units by mouth every 7 (seven) days. )   Reviewed prior external information including notes and imaging from  primary care provider As well as notes that were available from care everywhere and other healthcare systems.  Past medical history, social, surgical and family history all reviewed in electronic medical record.  No pertanent information unless stated regarding to the chief complaint.   Review of Systems:  No headache, visual changes, nausea, vomiting, diarrhea, constipation, dizziness, abdominal pain, skin rash, fevers, chills, night  sweats, weight loss, swollen lymph nodes, body aches, joint swelling, chest pain, shortness of breath, mood changes. POSITIVE muscle aches  Objective  There were no vitals taken for this visit.   General: No apparent distress alert and oriented x3 mood and affect normal, dressed appropriately.  HEENT: Pupils equal, extraocular movements intact  Respiratory: Patient's speak in full sentences and does not appear short of breath  Cardiovascular: No lower extremity edema, non tender, no erythema  Neuro: Cranial nerves II through XII are intact, neurovascularly intact in all extremities with 2+ DTRs and 2+ pulses.  Gait normal with good balance and coordination.  MSK:  Non tender with full range of motion and good stability and symmetric strength and tone of shoulders, elbows, wrist, hip, knee and ankles bilaterally.     Impression and Recommendations:     The above documentation has been reviewed and is accurate and complete Lyndal Pulley, DO       Note: This dictation was prepared with Dragon dictation along with smaller phrase technology. Any transcriptional errors that result from this process are unintentional.

## 2019-10-19 MED FILL — GABAPENTIN 300 MG CAPSULE: 300 | 30 days supply | Qty: 90 | Fill #1

## 2019-10-20 NOTE — Progress Notes (Signed)
Subjective:    Patient ID: Sandra [name], female    DOB: [birth year], 59 y.o.   MRN: [id number]  HPI The patient is here for follow up of their chronic medical problems, including DM, anxiety, depression   No longer on lexparo and wellbutrin.  She takes buspar   Since Sunday chest pain, unable to take deep breaths and difficulty swallowing.  This occurred after an intense argument with her son's girlfriend and having to kick her out of the house.   She is not exercising regularly.      Medications and allergies reviewed with patient and updated if appropriate.  Patient Active Problem List   Diagnosis Date Noted  . Nonallopathic lesion of sacral region 08/05/2019  . Greater trochanteric bursitis, right 08/05/2019  . Benign neoplasm of descending colon   . Low back pain 05/27/2019  . Hematoma of right hip 05/07/2019  . Trigger finger, right index finger 02/17/2019  . Anxiety and depression 11/12/2018  . Left lateral epicondylitis 05/14/2018  . Left elbow pain 05/04/2018  . Right rotator cuff tear 08/20/2017  . Cervical disc disorder with radiculopathy of cervical region 04/25/2016  . Nonallopathic lesion of cervical region 04/17/2016  . Nonallopathic lesion of thoracic region 04/17/2016  . Nonallopathic lesion of rib cage 04/17/2016  . Nonallopathic lesion of lumbosacral region 04/17/2016  . Degenerative cervical disc 03/29/2016  . Degenerative arthritis of right knee 03/29/2016  . 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 Adjustment disorder with mixed anxiety and depressed mood 07/14/2014  . Personal history of colonic polyps   . 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 Chronic constipation 03/05/2013  . Coronary atherosclerosis of native coronary artery 08/27/2011  . History of uterine cancer 08/15/2011  . Diabetes ([place]) 08/15/2011  . Hypercholesterolemia 08/15/2011  . History of colonic polyps 12/29/2009    Current Outpatient Medications on File  Prior to Visit  Medication Sig Dispense Refill  . Cyanocobalamin (B-12) 1000 MCG SUBL Place 1,000 mcg under the tongue daily.    Marland Kitchen esomeprazole (NEXIUM) 40 MG capsule Take 1 capsule (40 mg total) by mouth daily as needed (acid reflux).    . gabapentin (NEURONTIN) 300 MG capsule Take 1 capsule (300 mg total) by mouth 3 (three) times daily. 90 capsule 3  . glucose blood (FREESTYLE LITE) test strip Use as instructed 100 each 12  . glucose blood test strip Use to check blood sugars daily. 100 each 3  . linaclotide (LINZESS) 145 MCG CAPS capsule Take 1 capsule (145 mcg total) by mouth daily before breakfast. (Patient taking differently: Take 145 mcg by mouth daily as needed (constipation). ) 30 capsule 11  . Multiple Vitamins-Minerals (MULTIVITAMIN PO) Take 2 tablets by mouth daily.     . ondansetron (ZOFRAN-ODT) 4 MG disintegrating tablet DISSOLVE 1 TABLET BY MOUTH EVERY 8 HOURS AS NEEDED FOR NAUSEA OR VOMITING (MAY TAKE 2 FOR ONE INEFFECTIVE) 30 tablet 3  . sucralfate (CARAFATE) 1 g tablet Take 1 tablet (1 g total) by mouth daily as needed (acid).     No current facility-administered medications on file prior to visit.    Past Medical History:  Diagnosis Date  . Anxiety   . Arthritis   . CAD (coronary artery disease)    nonobstructive CAD in LAD per 2013 CTA coronary morph  . Cancer [facility]) 2006   uterine  . Complication of anesthesia PT STATES HAS LETTER FROM [place] [facility]. STATING THAT SHE HAS A VERY SMALL AIRWAY (LAST SURG. HYSTERECTOMY  AT [place] 2006   PT STATES LYNN BEASON CRNA WHOM WORKS HERE TOLD SHE WOULD BE OKAY TO BE DONE AT [facility]  . Depression   . Diabetes mellitus    no meds taken now, checks cbg once or twice a week   . Difficult intubation    had to use the glidescope due to small airway   . 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 GERD (gastroesophageal reflux disease)   . Heart murmur    with last visit, MD states "outgrown" murmur  . Hemorrhoids, internal, with  bleeding and Grade 2 prolapse 03/05/2013   All positions seen an anoscopy RP columns banded 03/05/2013 LL and RA banded 03/18/2013     . History of endometrial cancer 2006 ---- S/P ABD. HYSTERECTOMY   STAGE I ENDOMETROID CARCINOMA  . Hx of adenomatous colonic polyps 2011   BENIGN  . Hyperlipidemia    no meds now  . Hypertension   . Morbid obesity, weight - 203, BMI - 38.4 05/12/2013  . OSA on CPAP    cpap setting of 16  . Sleep apnea    no CPAP since surgery    Past Surgical History:  Procedure Laterality Date  . ABDOMINAL HYSTERECTOMY  12-20-2004   TAH  . BILATERAL BREAST REDUCTION  11-16-1999  . BREATH TEK H PYLORI N/A 05/27/2013   Procedure: BREATH TEK H PYLORI;  Surgeon: Shann Medal, MD;  Location: [facility];  Service: General;  Laterality: N/A;  . [place]  . COLONOSCOPY    . COLONOSCOPY N/A 04/25/2014   Procedure: COLONOSCOPY;  Surgeon: Gatha Mayer, MD;  Location: [facility];  Service: Endoscopy;  Laterality: N/A;  . COLONOSCOPY WITH PROPOFOL N/A 07/26/2019   Procedure: COLONOSCOPY WITH PROPOFOL;  Surgeon: Gatha Mayer, MD;  [facility];  Service: Endoscopy;  Laterality: N/A;  . CYSTOSCOPY  09/06/2011   Procedure: CYSTOSCOPY;  Surgeon: Ailene Rud, MD;  Location: [facility];  Service: Urology;  Laterality: N/A;  LYNX SLING   . ESOPHAGOGASTRODUODENOSCOPY (EGD) WITH PROPOFOL N/A 09/27/2014   Procedure: ESOPHAGOGASTRODUODENOSCOPY (EGD) WITH PROPOFOL;  Surgeon: Gatha Mayer, MD;  [facility];  Service: Endoscopy;  Laterality: N/A;  . ESOPHAGOGASTRODUODENOSCOPY (EGD) WITH PROPOFOL N/A 03/11/2016   Procedure: ESOPHAGOGASTRODUODENOSCOPY (EGD) WITH PROPOFOL;  Surgeon: Jerene Bears, MD;  [facility];  Service: Endoscopy;  Laterality: N/A;  . GASTRIC BYPASS  10/04/2013  . GASTRIC ROUX-EN-Y N/A 10/04/2013   Procedure: LAPAROSCOPIC ROUX-EN-Y GASTRIC BYPASS WITH UPPER ENDOSCOPY;  Surgeon: Shann Medal, MD;   [facility];  Service: General;  Laterality: N/A;  . HEMORRHOID BANDING  02/2013  . POLYPECTOMY    . POLYPECTOMY  07/26/2019   Procedure: POLYPECTOMY;  Surgeon: Gatha Mayer, MD;  Location: [facility];  Service: Endoscopy;;  . PUBOVAGINAL SLING  09/06/2011   Procedure: Gaynelle Arabian;  Surgeon: Ailene Rud, MD;  Location[facility];  Service: Urology;  Laterality: N/A;  . REDUCTION MAMMAPLASTY Bilateral 2001  . SHOULDER ARTHROSCOPY Right 10/23/2017   Procedure: RIGHT SHOULDER ARTHROSCOPIC ROTATOR CUFF REPAIR VS. DEBRIDEMENT, SUBACROMIAL DECOMPARESSION, DISTAL CLAVICAL EXCISION;  Surgeon: Tania Ade, MD;  Location: [place];  Service: Orthopedics;  Laterality: Right;  . UVULOPALATOPHARYNGOPLASTY, TONSILLECTOMY AND SEPTOPLASTY  1994    Social History   Socioeconomic History  . Marital status: Single    Spouse name: Not on file  . Number of children: Not on file  . Years  of education: Not on file  . Highest education level: Not on file  Occupational History  . Not on file  Tobacco Use  . Smoking status: Never Smoker  . Smokeless tobacco: Never Used  Vaping Use  . Vaping Use: Never used  Substance and Sexual Activity  . Alcohol use: No    Alcohol/week: 0.0 standard drinks  . Drug use: No  . Sexual activity: Not Currently    Birth control/protection: Surgical    Comment: HYST.. 1st intercourse- 13, partners- 6  Other Topics Concern  . 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 Difficulty of Paying Living Expenses:   Food Insecurity:   . Worried About Charity fundraiser in the Last Year:   . Arboriculturist in the Last Year:   Transportation Needs:   . Film/video editor (Medical):   Marland Kitchen Lack of Transportation (Non-Medical):    Physical Activity:   . Days of Exercise per Week:   . Minutes of Exercise per Session:   Stress:   . Feeling of Stress :   Social Connections:   . Frequency of Communication with Friends and Family:   . Frequency of Social Gatherings with Friends and Family:   . Attends Religious Services:   . Active Member of Clubs or Organizations:   . Attends Archivist Meetings:   Marland Kitchen Marital Status:     Family History  Adopted: Yes    Review of Systems  Constitutional: Negative for fever.  Respiratory: Positive for shortness of breath (related to anxiety). Negative for cough and wheezing.   Cardiovascular: Positive for chest pain (related to anxiety) and palpitations (related to anxiety). Negative for leg swelling.  Neurological: Positive for headaches. Negative for light-headedness.  Psychiatric/Behavioral: Positive for sleep disturbance. Negative for dysphoric mood. The patient is nervous/anxious.        Objective:   Vitals:   10/21/19 0819  BP: 130/78  Pulse: 67  Temp: 98 F (36.7 C)  SpO2: 98%   BP Readings from Last 3 Encounters:  10/21/19 130/78  08/05/19 134/80  07/26/19 127/83   Wt Readings from Last 3 Encounters:  10/21/19 158 lb (71.7 kg)  08/05/19 159 lb (72.1 kg)  07/26/19 150 lb (68 kg)   Body mass index is 28.9 kg/m.   Physical Exam    Constitutional: Appears well-developed and well-nourished. No distress.  HENT:  Head: Normocephalic and atraumatic.  Neck: Neck supple. No tracheal deviation present. No thyromegaly present.  No cervical lymphadenopathy Cardiovascular: Normal rate, regular rhythm and normal heart sounds.   No murmur heard. No carotid bruit .  No edema Pulmonary/Chest: Effort normal and breath sounds normal. No respiratory distress. No has no wheezes. No rales.  Skin: Skin is warm and dry. Not diaphoretic.  Psychiatric: Normal mood and affect. Behavior is normal.   Diabetic Foot Exam - Simple   Simple Foot Form Diabetic Foot  exam was performed with the following findings: Yes 10/21/2019  8:59 AM  Visual Inspection No deformities, no ulcerations, no other skin breakdown bilaterally: Yes Sensation Testing Intact to touch and monofilament testing bilaterally: Yes Pulse Check Posterior Tibialis and Dorsalis pulse intact bilaterally: Yes Comments       Assessment & Plan:    See  Problem List for Assessment and Plan of chronic medical problems.    This visit occurred during the SARS-CoV-2 public health emergency.  Safety protocols were in place, including screening questions prior to the visit, additional usage of staff PPE, and extensive cleaning of exam room while observing appropriate contact time as indicated for disinfecting solutions.

## 2019-10-20 NOTE — Patient Instructions (Addendum)
  Blood work was ordered.     Medications reviewed and updated.  Changes include :   Increase buspar to 7.5 mg three times a day   Your prescription(s) have been submitted to your pharmacy. Please take as directed and contact our office if you believe you are having problem(s) with the medication(s).     Please followup in 6 months

## 2019-10-21 ENCOUNTER — Ambulatory Visit: Payer: 59 | Admitting: Internal Medicine

## 2019-10-21 ENCOUNTER — Encounter: Payer: Self-pay | Admitting: Internal Medicine

## 2019-10-21 ENCOUNTER — Other Ambulatory Visit: Payer: Self-pay

## 2019-10-21 VITALS — BP 130/78 | HR 67 | Temp 98.0°F | Ht 62.0 in | Wt 158.0 lb

## 2019-10-21 DIAGNOSIS — F329 Major depressive disorder, single episode, unspecified: Secondary | ICD-10-CM | POA: Diagnosis not present

## 2019-10-21 DIAGNOSIS — E119 Type 2 diabetes mellitus without complications: Secondary | ICD-10-CM

## 2019-10-21 DIAGNOSIS — F419 Anxiety disorder, unspecified: Secondary | ICD-10-CM

## 2019-10-21 DIAGNOSIS — F32A Depression, unspecified: Secondary | ICD-10-CM

## 2019-10-21 LAB — CBC WITH DIFFERENTIAL/PLATELET
Basophils Absolute: 0 10*3/uL (ref 0.0–0.1)
Basophils Relative: 0.7 % (ref 0.0–3.0)
Eosinophils Absolute: 0.1 10*3/uL (ref 0.0–0.7)
Eosinophils Relative: 2 % (ref 0.0–5.0)
HCT: 42.3 % (ref 36.0–46.0)
Hemoglobin: 14.2 g/dL (ref 12.0–15.0)
Lymphocytes Relative: 39.7 % (ref 12.0–46.0)
Lymphs Abs: 2.8 10*3/uL (ref 0.7–4.0)
MCHC: 33.6 g/dL (ref 30.0–36.0)
MCV: 93.4 fl (ref 78.0–100.0)
Monocytes Absolute: 0.5 10*3/uL (ref 0.1–1.0)
Monocytes Relative: 7.9 % (ref 3.0–12.0)
Neutro Abs: 3.5 10*3/uL (ref 1.4–7.7)
Neutrophils Relative %: 49.7 % (ref 43.0–77.0)
Platelets: 223 10*3/uL (ref 150.0–400.0)
RBC: 4.53 Mil/uL (ref 3.87–5.11)
RDW: 12.7 % (ref 11.5–15.5)
WBC: 7 10*3/uL (ref 4.0–10.5)

## 2019-10-21 LAB — MICROALBUMIN / CREATININE URINE RATIO
Creatinine,U: 112.7 mg/dL
Microalb Creat Ratio: 1.5 mg/g (ref 0.0–30.0)
Microalb, Ur: 1.6 mg/dL (ref 0.0–1.9)

## 2019-10-21 LAB — COMPREHENSIVE METABOLIC PANEL
ALT: 23 U/L (ref 0–35)
AST: 20 U/L (ref 0–37)
Albumin: 4.4 g/dL (ref 3.5–5.2)
Alkaline Phosphatase: 50 U/L (ref 39–117)
BUN: 22 mg/dL (ref 6–23)
CO2: 29 mEq/L (ref 19–32)
Calcium: 9.6 mg/dL (ref 8.4–10.5)
Chloride: 104 mEq/L (ref 96–112)
Creatinine, Ser: 0.6 mg/dL (ref 0.40–1.20)
GFR: 102 mL/min (ref >60.00)
Glucose, Bld: 154 mg/dL — ABNORMAL HIGH (ref 70–99)
Potassium: 4 mEq/L (ref 3.5–5.1)
Sodium: 140 mEq/L (ref 135–145)
Total Bilirubin: 0.7 mg/dL (ref 0.2–1.2)
Total Protein: 6.8 g/dL (ref 6.0–8.3)

## 2019-10-21 LAB — LIPID PANEL
Cholesterol: 157 mg/dL (ref 0–200)
HDL: 38.7 mg/dL — ABNORMAL LOW (ref >39.00)
LDL Cholesterol: 85 mg/dL (ref 0–99)
NonHDL: 118.59
Total CHOL/HDL Ratio: 4
Triglycerides: 166 mg/dL — ABNORMAL HIGH (ref 0.0–149.0)
VLDL: 33.2 mg/dL (ref 0.0–40.0)

## 2019-10-21 LAB — HEMOGLOBIN A1C: Hgb A1c MFr Bld: 7.1 % — ABNORMAL HIGH (ref 4.6–6.5)

## 2019-10-21 MED ORDER — BUSPIRONE HCL 7.5 MG PO TABS: 7.5000 mg | ORAL_TABLET | Freq: Three times a day (TID) | ORAL | 5 refills | Status: DC

## 2019-10-21 NOTE — Assessment & Plan Note (Signed)
Chronic No active depression at this time, but increased stress/anxiety due to his son's girlfriend  Had a panic attack last Sunday and still with increased stress Increase buspar to 7.5 mg TID

## 2019-10-21 NOTE — Assessment & Plan Note (Signed)
Chronic Diet controlled Check a1c Low sugar / carb diet Stressed regular exercise

## 2019-11-16 ENCOUNTER — Other Ambulatory Visit: Payer: Self-pay

## 2019-11-16 ENCOUNTER — Ambulatory Visit: Payer: 59 | Admitting: Family Medicine

## 2019-11-16 ENCOUNTER — Encounter: Payer: Self-pay | Admitting: Family Medicine

## 2019-11-16 VITALS — BP 142/82 | HR 87 | Ht 62.0 in | Wt 158.6 lb

## 2019-11-16 DIAGNOSIS — G8929 Other chronic pain: Secondary | ICD-10-CM

## 2019-11-16 DIAGNOSIS — M999 Biomechanical lesion, unspecified: Secondary | ICD-10-CM

## 2019-11-16 DIAGNOSIS — M545 Low back pain: Secondary | ICD-10-CM | POA: Diagnosis not present

## 2019-11-16 NOTE — Progress Notes (Signed)
[name] [place] Phone: [phone] Subjective:   Sandra [name], am serving as a scribe for Dr. Hulan Saas.  This visit occurred during the SARS-CoV-2 public health emergency.  Safety protocols were in place, including screening questions prior to the visit, additional usage of staff PPE, and extensive cleaning of exam room while observing appropriate contact time as indicated for disinfecting solutions.   I'm seeing this patient by the request  of:  Sandra Rail, MD  CC: follow up  [id number]  08/05/2019 Status post hematoma and bone contusion.  Extra injections given today, tolerated procedure well, discussed icing regimen, which activities to doing which wants to avoid.  Increase activity slowly.  Follow-up again in 4 to 8 weeks  Update 11/16/2019 Patient states she has been doing well just normal soreness and aches. States still cannot sleep on right side.  Patient states that during the day though is not having any significant pain.  Is able to increase activity slowly.  Has been able to work without any significant discomfort and pain.  Not taking anything for pain regularly has gabapentin that she takes intermittently for other problems     Past Medical History:  Diagnosis Date  . Anxiety   . Arthritis   . CAD (coronary artery disease)    nonobstructive CAD in LAD per 2013 CTA coronary morph  . Cancer [facility]) 2006   uterine  . Complication of anesthesia PT STATES HAS LETTER FROM [place] [facility]. STATING THAT SHE HAS A VERY SMALL AIRWAY (LAST SURG. HYSTERECTOMY AT [facility] IN 2006   PT STATES LYNN BEASON CRNA WHOM WORKS HERE TOLD SHE WOULD BE OKAY TO BE DONE AT [facility]  . Depression   . Diabetes mellitus    no meds taken now, checks cbg once or twice a week   . Difficult intubation    had to use the glidescope due to small airway   . 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 GERD (gastroesophageal  reflux disease)   . Heart murmur    with last visit, MD states "outgrown" murmur  . Hemorrhoids, internal, with bleeding and Grade 2 prolapse 03/05/2013   All positions seen an anoscopy RP columns banded 03/05/2013 LL and RA banded 03/18/2013     . History of endometrial cancer 2006 ---- S/P ABD. HYSTERECTOMY   STAGE I ENDOMETROID CARCINOMA  . Hx of adenomatous colonic polyps 2011   BENIGN  . Hyperlipidemia    no meds now  . Hypertension   . Morbid obesity, weight - 203, BMI - 38.4 05/12/2013  . OSA on CPAP    cpap setting of 16  . Sleep apnea    no CPAP since surgery   Past Surgical History:  Procedure Laterality Date  . ABDOMINAL HYSTERECTOMY  12-20-2004   TAH  . BILATERAL BREAST REDUCTION  11-16-1999  . BREATH TEK H PYLORI N/A 05/27/2013   Procedure: BREATH TEK H PYLORI;  Surgeon: Shann Medal, MD;  [facility]: [facility];  Service: General;  Laterality: N/A;  . [place]  . COLONOSCOPY    . COLONOSCOPY N/A 04/25/2014   Procedure: COLONOSCOPY;  Surgeon: Gatha Mayer, MD;  [facility];  Service: Endoscopy;  Laterality: N/A;  . COLONOSCOPY WITH PROPOFOL N/A 07/26/2019   Procedure: COLONOSCOPY WITH PROPOFOL;  Surgeon: Gatha Mayer, MD;  [facility];  Service: Endoscopy;  Laterality: N/A;  . CYSTOSCOPY  09/06/2011  Procedure: CYSTOSCOPY;  Surgeon: Ailene Rud, MD;  [facility];  Service: Urology;  Laterality: N/A;  LYNX SLING   . ESOPHAGOGASTRODUODENOSCOPY (EGD) WITH PROPOFOL N/A 09/27/2014   Procedure: ESOPHAGOGASTRODUODENOSCOPY (EGD) WITH PROPOFOL;  Surgeon: Gatha Mayer, MD;  [facility];  Service: Endoscopy;  Laterality: N/A;  . ESOPHAGOGASTRODUODENOSCOPY (EGD) WITH PROPOFOL N/A 03/11/2016   Procedure: ESOPHAGOGASTRODUODENOSCOPY (EGD) WITH PROPOFOL;  Surgeon: Jerene Bears, MD;  [facility];  Service: Endoscopy;  Laterality: N/A;  . GASTRIC BYPASS  10/04/2013  . GASTRIC ROUX-EN-Y N/A  10/04/2013   Procedure: LAPAROSCOPIC ROUX-EN-Y GASTRIC BYPASS WITH UPPER ENDOSCOPY;  Surgeon: Shann Medal, MD;  [facility];  Service: General;  Laterality: N/A;  . HEMORRHOID BANDING  02/2013  . POLYPECTOMY    . POLYPECTOMY  07/26/2019   Procedure: POLYPECTOMY;  Surgeon: Gatha Mayer, MD;  [facility];  Service: Endoscopy;;  . PUBOVAGINAL SLING  09/06/2011   Procedure: Gaynelle Arabian;  Surgeon: Ailene Rud, MD;  Location[facility];  Service: Urology;  Laterality: N/A;  . REDUCTION MAMMAPLASTY Bilateral 2001  . SHOULDER ARTHROSCOPY Right 10/23/2017   Procedure: RIGHT SHOULDER ARTHROSCOPIC ROTATOR CUFF REPAIR VS. DEBRIDEMENT, SUBACROMIAL DECOMPARESSION, DISTAL CLAVICAL EXCISION;  Surgeon: Tania Ade, MD;  Location: [place];  Service: Orthopedics;  Laterality: Right;  . UVULOPALATOPHARYNGOPLASTY, TONSILLECTOMY AND SEPTOPLASTY  1994   Social History   Socioeconomic History  . Marital status: Single    Spouse name: Not on file  . Number of children: Not on file  . Years of education: Not on file  . Highest education level: Not on file  Occupational History  . Not on file  Tobacco Use  . Smoking status: Never Smoker  . Smokeless tobacco: Never Used  Vaping Use  . Vaping Use: Never used  Substance and Sexual Activity  . Alcohol use: No    Alcohol/week: 0.0 standard drinks  . Drug use: No  . Sexual activity: Not Currently    Birth control/protection: Surgical    Comment: HYST.. 1st intercourse- 13, partners- 6  Other Topics Concern  . 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 Difficulty of Paying Living Expenses:   Food Insecurity:   . Worried About Charity fundraiser in the Last Year:   . Arboriculturist in the Last  Year:   Transportation Needs:   . Film/video editor (Medical):   Sandra [name] Lack of Transportation (Non-Medical):   Physical Activity:   . Days of Exercise per Week:   . Minutes of Exercise per Session:   Stress:   . Feeling of Stress :   Social Connections:   . Frequency of Communication with Friends and Family:   . Frequency of Social Gatherings with Friends and Family:   . Attends Religious Services:   . Active Member of Clubs or Organizations:   . Attends Archivist Meetings:   Sandra [name] Marital Status:    Allergies  Allergen Reactions  . Nsaids     r/t gastric surgery  . Statins    Family History  Adopted: Yes        Current Outpatient Medications (Hematological):  Sandra [name]  Cyanocobalamin (B-12) 1000 MCG SUBL, Place 1,000 mcg under the  tongue daily.  Current Outpatient Medications (Other):  .  busPIRone (BUSPAR) 7.5 MG tablet, Take 1 tablet (7.5 mg total) by mouth 3 (three) times daily. Sandra [name]  esomeprazole (NEXIUM) 40 MG capsule, Take 1 capsule (40 mg total) by mouth daily as needed (acid reflux). .  gabapentin (NEURONTIN) 300 MG capsule, Take 1 capsule (300 mg total) by mouth 3 (three) times daily. Sandra [name]  glucose blood (FREESTYLE LITE) test strip, Use as instructed .  glucose blood test strip, Use to check blood sugars daily. Sandra [name]  linaclotide (LINZESS) 145 MCG CAPS capsule, Take 1 capsule (145 mcg total) by mouth daily before breakfast. (Patient taking differently: Take 145 mcg by mouth daily as needed (constipation). ) .  Multiple Vitamins-Minerals (MULTIVITAMIN PO), Take 2 tablets by mouth daily.  .  ondansetron (ZOFRAN-ODT) 4 MG disintegrating tablet, DISSOLVE 1 TABLET BY MOUTH EVERY 8 HOURS AS NEEDED FOR NAUSEA OR VOMITING (MAY TAKE 2 FOR ONE INEFFECTIVE) .  sucralfate (CARAFATE) 1 g tablet, Take 1 tablet (1 g total) by mouth daily as needed (acid).   Reviewed prior external information including notes and imaging from  primary care provider As well as notes that were  available from care everywhere and other healthcare systems.  Past medical history, social, surgical and family history all reviewed in electronic medical record.  No pertanent information unless stated regarding to the chief complaint.   Review of Systems:  No headache, visual changes, nausea, vomiting, diarrhea, constipation, dizziness, abdominal pain, skin rash, fevers, chills, night sweats, weight loss, swollen lymph nodes, body aches, joint swelling, chest pain, shortness of breath, mood changes. POSITIVE muscle aches  Objective  Blood pressure (!) 142/82, pulse 87, height 5\' 2"  (1.575 m), weight 158 lb 9.6 oz (71.9 kg), SpO2 98 %.   General: No apparent distress alert and oriented x3 mood and affect normal, dressed appropriately.  HEENT: Pupils equal, extraocular movements intact  Respiratory: Patient's speak in full sentences and does not appear short of breath  Cardiovascular: No lower extremity edema, non tender, no erythema  Neuro: Cranial nerves II through XII are intact, neurovascularly intact in all extremities with 2+ DTRs and 2+ pulses.  Gait normal with good balance and coordination.  MSK:   Right side of the back does have more tightness than the left.  Tightness with FABER test.  Negative straight leg test.  Patient is still tender to palpation of the greater trochanteric area.   Osteopathic findings  C6 flexed rotated and side bent left T5 extended rotated and side bent right inhaled rib T10 extended rotated and side bent left L2 flexed rotated and side bent right Sacrum right on right     Impression and Recommendations:     The above documentation has been reviewed and is accurate and complete Lyndal Pulley, DO       Note: This dictation was prepared with Dragon dictation along with smaller phrase technology. Any transcriptional errors that result from this process are unintentional.

## 2019-11-16 NOTE — Assessment & Plan Note (Signed)
Patient seen seems to be doing well but I do think a bone contusion is going to take a total of about 6 months to fully heal.  Is making improvement though.  Do not think that any aspiration is necessary.  Attempted osteopathic manipulation.  Increase activity slowly and follow-up again 6 to 8 weeks

## 2019-11-16 NOTE — Patient Instructions (Signed)
Good to see you. Overall doing great Keep working on exercises See me again in 7-8 weeks for more manipulation

## 2019-11-16 NOTE — Assessment & Plan Note (Signed)

## 2019-12-13 DIAGNOSIS — Z961 Presence of intraocular lens: Secondary | ICD-10-CM | POA: Diagnosis not present

## 2019-12-13 DIAGNOSIS — H35371 Puckering of macula, right eye: Secondary | ICD-10-CM | POA: Diagnosis not present

## 2019-12-13 DIAGNOSIS — H40013 Open angle with borderline findings, low risk, bilateral: Secondary | ICD-10-CM | POA: Diagnosis not present

## 2019-12-22 MED FILL — ONDANSETRON ODT 4 MG TABLET: 4 | 5 days supply | Qty: 30 | Fill #1

## 2020-01-03 NOTE — Progress Notes (Signed)
[place] [address] [place] Phone: [phone] Subjective:   I Sandra [name] am serving as a Education administrator for Dr. Hulan Saas.  This visit occurred during the SARS-CoV-2 public health emergency.  Safety protocols were in place, including screening questions prior to the visit, additional usage of staff PPE, and extensive cleaning of exam room while observing appropriate contact time as indicated for disinfecting solutions.   I'm seeing this patient by the request  of:  [name], MD  CC: Low back pain and finger pain follow-up  [id number]  Sandra [name] is a 59 y.o. female coming in with complaint of back and neck pain. OMT 11/16/2019. Patient states she is doing well. Also first finger pain. Patient would like injection and states ROM is limited.   Medications patient has been prescribed: BuSpar otherwise nothing and gabapentin  Taking: Yes to the gabapentin         Reviewed prior external information including notes and imaging from previsou exam, outside providers and external EMR if available.   As well as notes that were available from care everywhere and other healthcare systems.  Past medical history, social, surgical and family history all reviewed in electronic medical record.  No pertanent information unless stated regarding to the chief complaint.   Past Medical History:  Diagnosis Date  . Anxiety   . Arthritis   . CAD (coronary artery disease)    nonobstructive CAD in LAD per 2013 CTA coronary morph  . Cancer [facility]) 2006   uterine  . Complication of anesthesia PT STATES HAS LETTER FROM [place] [facility]. STATING THAT SHE HAS A VERY SMALL AIRWAY (LAST SURG. HYSTERECTOMY AT [facility] IN 2006   PT STATES LYNN BEASON CRNA WHOM WORKS HERE TOLD SHE WOULD BE OKAY TO BE DONE AT [facility]  . Depression   . Diabetes mellitus    no meds taken now, checks cbg once or twice a week   . Difficult intubation    had to use the  glidescope due to small airway   . 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 GERD (gastroesophageal reflux disease)   . Heart murmur    with last visit, MD states "outgrown" murmur  . Hemorrhoids, internal, with bleeding and Grade 2 prolapse 03/05/2013   All positions seen an anoscopy RP columns banded 03/05/2013 LL and RA banded 03/18/2013     . History of endometrial cancer 2006 ---- S/P ABD. HYSTERECTOMY   STAGE I ENDOMETROID CARCINOMA  . Hx of adenomatous colonic polyps 2011   BENIGN  . Hyperlipidemia    no meds now  . Hypertension   . Morbid obesity, weight - 203, BMI - 38.4 05/12/2013  . OSA on CPAP    cpap setting of 16  . Sleep apnea    no CPAP since surgery    Allergies  Allergen Reactions  . Nsaids     r/t gastric surgery  . Statins      Review of Systems:  No headache, visual changes, nausea, vomiting, diarrhea, constipation, dizziness, abdominal pain, skin rash, fevers, chills, night sweats, weight loss, swollen lymph nodes, body aches, joint swelling, chest pain, shortness of breath, mood changes. POSITIVE muscle aches  Objective  There were no vitals taken for this visit.   General: No apparent distress alert and oriented x3 mood and affect normal, dressed appropriately.  HEENT: Pupils equal, extraocular movements intact  Respiratory: Patient's speak in full sentences and does not  appear short of breath  Cardiovascular: No lower extremity edema, non tender, no erythema  Neuro: Cranial nerves II through XII are intact, neurovascularly intact in all extremities with 2+ DTRs and 2+ pulses.  Gait normal with good balance and coordination.  MSK:  Non tender with full range of motion and good stability and symmetric strength and tone of shoulders, elbows, wrist, hip, knee and ankles bilaterally.  Back -back exam does have some mild loss of lordosis, morning loss of lordosis of the cervical spine.  No radicular symptoms.  Patient does have tightness with FABER  test of the lower back.  Hand exam shows the patient does have triggering noted over the right index finger.  This is at the A2 pulley with nodule noted.  Osteopathic findings  C2 flexed rotated and side bent right T3 extended rotated and side bent right inhaled rib T8 extended rotated and side bent left L4 flexed rotated and side bent left Sacrum right on right   After verbal consent patient was prepped with alcohol swab and with a 25-gauge half inch needle injected into the flexor tendon sheath at the A2 pulley with a total of 0.5 cc of 0.5% Marcaine and 0.5 cc of Kenalog 40 mg/mL no blood loss.  Band-Aid placed.  Postinjection instructions given    Assessment and Plan:  Trigger finger, right index finger Repeat injection given today, tolerated the procedure well, discussed icing regimen and home exercise, which activities to do which wants to avoid.  Discussed with patient about the possibility of surgical intervention with now having 3 within a calendar year  Low back pain Seems to be multifactorial.  Chronic problem with exacerbation.  Responded well to manipulation.  Discussed with patient though about different medications, discussing the different type of changing in positioning of work that I think can be beneficial.  Increase activity slowly.  Follow-up again in 4 to 8 weeks.    Nonallopathic problems  Decision today to treat with OMT was based on Physical Exam  After verbal consent patient was treated with HVLA, ME, FPR techniques in cervical, rib, thoracic, lumbar, and sacral  areas  Patient tolerated the procedure well with improvement in symptoms  Patient given exercises, stretches and lifestyle modifications  See medications in patient instructions if given  Patient will follow up in 4-8 weeks      The above documentation has been reviewed and is accurate and complete Lyndal Pulley, DO       Note: This dictation was prepared with Dragon dictation along  with smaller phrase technology. Any transcriptional errors that result from this process are unintentional.

## 2020-01-04 ENCOUNTER — Ambulatory Visit: Payer: 59 | Admitting: Family Medicine

## 2020-01-04 ENCOUNTER — Other Ambulatory Visit: Payer: Self-pay

## 2020-01-04 ENCOUNTER — Encounter: Payer: Self-pay | Admitting: Family Medicine

## 2020-01-04 VITALS — BP 120/86 | HR 64 | Ht 62.0 in | Wt 159.0 lb

## 2020-01-04 DIAGNOSIS — M545 Low back pain: Secondary | ICD-10-CM | POA: Diagnosis not present

## 2020-01-04 DIAGNOSIS — M5030 Other cervical disc degeneration, unspecified cervical region: Secondary | ICD-10-CM | POA: Diagnosis not present

## 2020-01-04 DIAGNOSIS — M65321 Trigger finger, right index finger: Secondary | ICD-10-CM

## 2020-01-04 DIAGNOSIS — G8929 Other chronic pain: Secondary | ICD-10-CM

## 2020-01-04 DIAGNOSIS — M5010 Cervical disc disorder with radiculopathy, unspecified cervical region: Secondary | ICD-10-CM

## 2020-01-04 DIAGNOSIS — M999 Biomechanical lesion, unspecified: Secondary | ICD-10-CM

## 2020-01-04 NOTE — Assessment & Plan Note (Signed)
Stable at the moment.  Responded well to manipulation.  No significant changes in management.  Follow-up 4 weeks

## 2020-01-04 NOTE — Patient Instructions (Addendum)
Good to see you Injected the finger today Overall not bad Stretch hip flexors after a lot of activity Stay hydrated  See me again in 6 weeks

## 2020-01-04 NOTE — Assessment & Plan Note (Signed)
Seems to be multifactorial.  Chronic problem with exacerbation.  Responded well to manipulation.  Discussed with patient though about different medications, discussing the different type of changing in positioning of work that I think can be beneficial.  Increase activity slowly.  Follow-up again in 4 to 8 weeks.

## 2020-01-04 NOTE — Assessment & Plan Note (Signed)
Repeat injection given today, tolerated the procedure well, discussed icing regimen and home exercise, which activities to do which wants to avoid.  Discussed with patient about the possibility of surgical intervention with now having 3 within a calendar year

## 2020-01-06 MED FILL — GABAPENTIN 300 MG CAPSULE: 300 | 30 days supply | Qty: 90 | Fill #2

## 2020-01-10 ENCOUNTER — Telehealth: Payer: 59 | Admitting: Emergency Medicine

## 2020-01-10 DIAGNOSIS — R059 Cough, unspecified: Secondary | ICD-10-CM

## 2020-01-10 DIAGNOSIS — R05 Cough: Secondary | ICD-10-CM | POA: Diagnosis not present

## 2020-01-10 MED ORDER — BENZONATATE 100 MG PO CAPS: 100.0000 mg | ORAL_CAPSULE | Freq: Three times a day (TID) | ORAL | 0 refills | Status: DC | PRN

## 2020-01-10 MED ORDER — ALBUTEROL SULFATE HFA 108 (90 BASE) MCG/ACT IN AERS: 1.0000 | INHALATION_SPRAY | RESPIRATORY_TRACT | 0 refills | Status: DC | PRN

## 2020-01-10 MED FILL — ALBUTEROL SULFATE HFA 108 (: 108 (90 BAS | 17 days supply | Qty: 18 | Fill #0

## 2020-01-10 NOTE — Progress Notes (Signed)
We are sorry that you are not feeling well.  Here is how we plan to help!  Based on your presentation I believe you most likely have A cough due to a virus.  This is called viral bronchitis and is best treated by rest, plenty of fluids and control of the cough.  You may use Ibuprofen or Tylenol as directed to help your symptoms.     In addition you may use A prescription cough medication called Tessalon Perles 100mg . You may take 1-2 capsules every 8 hours as needed for your cough.  I have also prescribed an inhaler.  If your symptoms are not improving with this treatment you will need to see your primary care doctor.  From your responses in the eVisit questionnaire you describe inflammation in the upper respiratory tract which is causing a significant cough.  This is commonly called Bronchitis and has four common causes:    Allergies  Viral Infections  Acid Reflux  Bacterial Infection Allergies, viruses and acid reflux are treated by controlling symptoms or eliminating the cause. An example might be a cough caused by taking certain blood pressure medications. You stop the cough by changing the medication. Another example might be a cough caused by acid reflux. Controlling the reflux helps control the cough.  USE OF BRONCHODILATOR ("RESCUE") INHALERS: There is a risk from using your bronchodilator too frequently.  The risk is that over-reliance on a medication which only relaxes the muscles surrounding the breathing tubes can reduce the effectiveness of medications prescribed to reduce swelling and congestion of the tubes themselves.  Although you feel brief relief from the bronchodilator inhaler, your asthma may actually be worsening with the tubes becoming more swollen and filled with mucus.  This can delay other crucial treatments, such as oral steroid medications. If you need to use a bronchodilator inhaler daily, several times per day, you should discuss this with your provider.  There are  probably better treatments that could be used to keep your asthma under control.     HOME CARE . Only take medications as instructed by your medical team. . Complete the entire course of an antibiotic. . Drink plenty of fluids and get plenty of rest. . Avoid close contacts especially the very young and the elderly . Cover your mouth if you cough or cough into your sleeve. . Always remember to wash your hands . A steam or ultrasonic humidifier can help congestion.   GET HELP RIGHT AWAY IF: . You develop worsening fever. . You become short of breath . You cough up blood. . Your symptoms persist after you have completed your treatment plan MAKE SURE YOU   Understand these instructions.  Will watch your condition.  Will get help right away if you are not doing well or get worse.  Your e-visit answers were reviewed by a board certified advanced clinical practitioner to complete your personal care plan.  Depending on the condition, your plan could have included both over the counter or prescription medications. If there is a problem please reply  once you have received a response from your provider. Your safety is important to [place].  If you have drug allergies check your prescription carefully.    You can use MyChart to ask questions about today's visit, request a non-urgent call back, or ask for a work or school excuse for 24 hours related to this e-Visit. If it has been greater than 24 hours you will need to follow up with your provider,  or enter a new e-Visit to address those concerns. You will get an e-mail in the next two days asking about your experience.  I hope that your e-visit has been valuable and will speed your recovery. Thank you for using e-visits.  **Please do not respond to this message unless you have follow up questions.** Greater than 5 but less than 10 minutes spent researching, coordinating, and implementing care for this patient today

## 2020-02-14 NOTE — Progress Notes (Signed)
[name] [address] [place] Phone: [phone] Subjective:   I Sandra [name] am serving as a Education administrator for Dr. Hulan Saas.  This visit occurred during the SARS-CoV-2 public health emergency.  Safety protocols were in place, including screening questions prior to the visit, additional usage of staff PPE, and extensive cleaning of exam room while observing appropriate contact time as indicated for disinfecting solutions.   I'm seeing this patient by the request  of:  [name], MD  CC: Neck pain and upper back pain follow-up  [id number]  Sandra [name] is a 59 y.o. female coming in with complaint of back and neck pain. OMT 01/04/2020. Patient states she is a little stiff.  Patient is to increase activity slowly over the course of the last couple weeks.  Has been using her one more on a more regular basis and has noticed more discomfort at the moment.  Patient states some mild radiation down the right arm.  Medications patient has been prescribed: Gabapentin  Taking: Yes even taking 2 at night         Reviewed prior external information including notes and imaging from previsou exam, outside providers and external EMR if available.   As well as notes that were available from care everywhere and other healthcare systems.  Past medical history, social, surgical and family history all reviewed in electronic medical record.  No pertanent information unless stated regarding to the chief complaint.   Past Medical History:  Diagnosis Date  . Anxiety   . Arthritis   . CAD (coronary artery disease)    nonobstructive CAD in LAD per 2013 CTA coronary morph  . Cancer [facility]) 2006   uterine  . Complication of anesthesia PT STATES HAS LETTER FROM [place] [facility]. STATING THAT SHE HAS A VERY SMALL AIRWAY (LAST SURG. HYSTERECTOMY AT [facility] IN 2006   PT STATES LYNN BEASON CRNA WHOM WORKS HERE TOLD SHE WOULD BE OKAY TO BE DONE AT [facility]  .  Depression   . Diabetes mellitus    no meds taken now, checks cbg once or twice a week   . Difficult intubation    had to use the glidescope due to small airway   . 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 GERD (gastroesophageal reflux disease)   . Heart murmur    with last visit, MD states "outgrown" murmur  . Hemorrhoids, internal, with bleeding and Grade 2 prolapse 03/05/2013   All positions seen an anoscopy RP columns banded 03/05/2013 LL and RA banded 03/18/2013     . History of endometrial cancer 2006 ---- S/P ABD. HYSTERECTOMY   STAGE I ENDOMETROID CARCINOMA  . Hx of adenomatous colonic polyps 2011   BENIGN  . Hyperlipidemia    no meds now  . Hypertension   . Morbid obesity, weight - 203, BMI - 38.4 05/12/2013  . OSA on CPAP    cpap setting of 16  . Sleep apnea    no CPAP since surgery    Allergies  Allergen Reactions  . Nsaids     r/t gastric surgery  . Statins      Review of Systems:  No headache, visual changes, nausea, vomiting, diarrhea, constipation, dizziness, abdominal pain, skin rash, fevers, chills, night sweats, weight loss, swollen lymph nodes, body aches, joint swelling, chest pain, shortness of breath, mood changes. POSITIVE muscle aches  Objective  Blood pressure 130/78, pulse 63, height 5\' 2"  (1.575 m), weight  159 lb (72.1 kg), SpO2 97 %.   General: No apparent distress alert and oriented x3 mood and affect normal, dressed appropriately.  HEENT: Pupils equal, extraocular movements intact  Respiratory: Patient's speak in full sentences and does not appear short of breath  Cardiovascular: No lower extremity edema, non tender, no erythema  Neuro: Cranial nerves II through XII are intact, neurovascularly intact in all extremities with 2+ DTRs and 2+ pulses.  Gait normal with good balance and coordination.  MSK:  Non tender with full range of motion and good stability and symmetric strength and tone of shoulders, elbows, wrist, hip, knee and  ankles bilaterally.  Back -low back exam does have some mild loss of lordosis and some tenderness to palpation in the paraspinal musculature of the parascapular region.  Tightness of the low back.  Neck has significant tightness as well.  Osteopathic findings  C7 flexed rotated and side bent right T3 extended rotated and side bent right inhaled rib T9 extended rotated and side bent left L2 flexed rotated and side bent right Sacrum right on right      Assessment and Plan:  Cervical disc disorder with radiculopathy of cervical region Mild increase in tightness of his chronic problem with mild exacerbation.  We did discuss the potential for the BuSpar.  We discussed the gabapentin can take up to 600 at night occasionally.  If she is doing it regularly will need to consider other medications.  Recommend home exercise.  Increase activity slowly.  Follow-up with me again in 4 to 8 weeks    Nonallopathic problems  Decision today to treat with OMT was based on Physical Exam  After verbal consent patient was treated with HVLA, ME, FPR techniques in cervical, rib, thoracic, lumbar, and sacral  areas avoided HVLA of the neck.  Patient tolerated the procedure well with improvement in symptoms  Patient given exercises, stretches and lifestyle modifications  See medications in patient instructions if given  Patient will follow up in 4-8 weeks      The above documentation has been reviewed and is accurate and complete Lyndal Pulley, DO       Note: This dictation was prepared with Dragon dictation along with smaller phrase technology. Any transcriptional errors that result from this process are unintentional.

## 2020-02-15 ENCOUNTER — Other Ambulatory Visit: Payer: Self-pay

## 2020-02-15 ENCOUNTER — Encounter: Payer: Self-pay | Admitting: Family Medicine

## 2020-02-15 ENCOUNTER — Ambulatory Visit: Payer: 59 | Admitting: Family Medicine

## 2020-02-15 VITALS — BP 130/78 | HR 63 | Ht 62.0 in | Wt 159.0 lb

## 2020-02-15 DIAGNOSIS — M5010 Cervical disc disorder with radiculopathy, unspecified cervical region: Secondary | ICD-10-CM | POA: Diagnosis not present

## 2020-02-15 DIAGNOSIS — M999 Biomechanical lesion, unspecified: Secondary | ICD-10-CM

## 2020-02-15 NOTE — Assessment & Plan Note (Signed)
Mild increase in tightness of his chronic problem with mild exacerbation.  We did discuss the potential for the BuSpar.  We discussed the gabapentin can take up to 600 at night occasionally.  If she is doing it regularly will need to consider other medications.  Recommend home exercise.  Increase activity slowly.  Follow-up with me again in 4 to 8 weeks

## 2020-02-15 NOTE — Patient Instructions (Addendum)
Good to see you Foam roller or yoga wheel to extend the back Tennis ball in tube sock See me again in 6-8 weeks

## 2020-03-08 ENCOUNTER — Encounter: Payer: 59 | Admitting: Obstetrics & Gynecology

## 2020-03-27 MED FILL — GABAPENTIN 300 MG CAPSULE: 300 | 30 days supply | Qty: 90 | Fill #3

## 2020-03-28 ENCOUNTER — Encounter: Payer: 59 | Admitting: Obstetrics & Gynecology

## 2020-03-29 ENCOUNTER — Other Ambulatory Visit: Payer: Self-pay | Admitting: Internal Medicine

## 2020-03-29 MED FILL — ESOMEPRAZOLE MAG DR 40 MG C: 40 | 90 days supply | Qty: 180 | Fill #0

## 2020-03-29 NOTE — Telephone Encounter (Signed)
Please advise Sir, thank you. 

## 2020-03-31 ENCOUNTER — Ambulatory Visit: Payer: 59 | Admitting: Family Medicine

## 2020-04-06 ENCOUNTER — Other Ambulatory Visit: Payer: Self-pay

## 2020-04-06 ENCOUNTER — Encounter: Payer: Self-pay | Admitting: Family Medicine

## 2020-04-06 ENCOUNTER — Ambulatory Visit: Payer: 59 | Admitting: Family Medicine

## 2020-04-06 VITALS — BP 140/90 | HR 66 | Ht 62.0 in | Wt 160.0 lb

## 2020-04-06 DIAGNOSIS — S7001XD Contusion of right hip, subsequent encounter: Secondary | ICD-10-CM | POA: Diagnosis not present

## 2020-04-06 DIAGNOSIS — M999 Biomechanical lesion, unspecified: Secondary | ICD-10-CM

## 2020-04-06 DIAGNOSIS — M545 Low back pain, unspecified: Secondary | ICD-10-CM | POA: Diagnosis not present

## 2020-04-06 DIAGNOSIS — M25551 Pain in right hip: Secondary | ICD-10-CM | POA: Diagnosis not present

## 2020-04-06 DIAGNOSIS — G8929 Other chronic pain: Secondary | ICD-10-CM | POA: Diagnosis not present

## 2020-04-06 DIAGNOSIS — M7061 Trochanteric bursitis, right hip: Secondary | ICD-10-CM

## 2020-04-06 NOTE — Assessment & Plan Note (Signed)
Moderate overall. Likely compensating for some of the hip pain. Discussed with patient icing regimen and home exercises does respond fairly well to osteopathic manipulation. Follow-up with me again in 4 to 8 weeks

## 2020-04-06 NOTE — Patient Instructions (Signed)
MRA hip [place] Follow-up in 6 weeks

## 2020-04-06 NOTE — Progress Notes (Signed)
[place] Phone: [phone] Subjective:   [name], am serving as a scribe for Dr. Hulan Saas. This visit occurred during the SARS-CoV-2 public health emergency.  Safety protocols were in place, including screening questions prior to the visit, additional usage of staff PPE, and extensive cleaning of exam room while observing appropriate contact time as indicated for disinfecting solutions.   I'm seeing this patient by the request  of:  [name], MD  CC: Back and neck pain follow-up  [id number]  Shanzay Bhumi Godbey is a 59 y.o. female coming in with complaint of back and neck pain. OMT 02/15/2020. Patient states that she continues to have pain in right hip. Last week patient had a couple episodes when she was unable to bear full weight on right leg. Pain over lateral hip. Would like to consider MRI of hip. Pain in right hip is constant otherwise. Patient used ice for pain.   Medications patient has been prescribed: Gabapentin  Taking:yes          Reviewed prior external information including notes and imaging from previsou exam, outside providers and external EMR if available.   As well as notes that were available from care everywhere and other healthcare systems.  Past medical history, social, surgical and family history all reviewed in electronic medical record.  No pertanent information unless stated regarding to the chief complaint.   Past Medical History:  Diagnosis Date  . Anxiety   . Arthritis   . CAD (coronary artery disease)    nonobstructive CAD in LAD per 2013 CTA coronary morph  . Cancer [facility]) 2006   uterine  . Complication of anesthesia PT STATES HAS LETTER FROM [place] [facility]. STATING THAT SHE HAS A VERY SMALL AIRWAY (LAST SURG. HYST[facility]CTOMY AT [facility] IN 2006   PT STATES LYNN BEASON CRNA WHOM WORKS HERE TOLD SHE WOULD BE OKAY TO BE DONE AT [facility]  . Depression   . Diabetes  mellitus    no meds taken now, checks cbg once or twice a week   . Difficult intubation    had to use the glidescope due to small airway   . 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 GERD (gastroesophageal reflux disease)   . Heart murmur    with last visit, MD states "outgrown" murmur  . Hemorrhoids, internal, with bleeding and Grade 2 prolapse 03/05/2013   All positions seen an anoscopy RP columns banded 03/05/2013 LL and RA banded 03/18/2013     . History of endometrial cancer 2006 ---- S/P ABD. HYSTERECTOMY   STAGE I ENDOMETROID CARCINOMA  . Hx of adenomatous colonic polyps 2011   BENIGN  . Hyperlipidemia    no meds now  . Hypertension   . Morbid obesity, weight - 203, BMI - 38.4 05/12/2013  . OSA on CPAP    cpap setting of 16  . Sleep apnea    no CPAP since surgery    Allergies  Allergen Reactions  . Nsaids     r/t gastric surgery  . Statins      Review of Systems:  No headache, visual changes, nausea, vomiting, diarrhea, constipation, dizziness, abdominal pain, skin rash, fevers, chills, night sweats, weight loss, swollen lymph nodes, body aches, joint swelling, chest pain, shortness of breath, mood changes. POSITIVE muscle aches  Objective  Blood pressure 140/90, pulse 66, height 5\' 2"  (1.575 m), weight 160 lb (72.6 kg), SpO2 98 %.  General: No apparent distress alert and oriented x3 mood and affect normal, dressed appropriately.  HEENT: Pupils equal, extraocular movements intact  Respiratory: Patient's speak in full sentences and does not appear short of breath  Cardiovascular: No lower extremity edema, non tender, no erythema  Mild antalgic. Patient does have some limited range of motion with external rotation of the hip more than internal rotation of the hip patient does have a mild positive grind test noted. Still tender to palpation over the greater trochanteric area. Patient's low back exam does have tightness noted in the paraspinal musculature of the  lumbar spine right greater than left. Neck exam does have loss of lordosis. Does have some crepitus with range of motion but negative Spurling's. Osteopathic findings  C2 flexed rotated and side bent right T3 extended rotated and side bent right inhaled rib T9 extended rotated and side bent left L2 flexed rotated and side bent right Sacrum right on right       Assessment and Plan:  Hematoma of right hip Patient did fall initially and had a hematoma of the right hip. We did give an injection and patient made improvement. This has been going on now for nearly a year at this time and continues to have intermittent pain. Patient states that she did finally have some instability which is new. At this point I do feel an MR arthrogram is necessary. Patient will have this done and then we will discuss further treatment options thereafter.    Nonallopathic problems  Decision today to treat with OMT was based on Physical Exam  After verbal consent patient was treated with HVLA, ME, FPR techniques in cervical, rib, thoracic, lumbar, and sacral  areas  Patient tolerated the procedure well with improvement in symptoms  Patient given exercises, stretches and lifestyle modifications  See medications in patient instructions if given  Patient will follow up in 4-8 weeks      The above documentation has been reviewed and is accurate and complete Lyndal Pulley, DO       Note: This dictation was prepared with Dragon dictation along with smaller phrase technology. Any transcriptional errors that result from this process are unintentional.

## 2020-04-06 NOTE — Assessment & Plan Note (Signed)
Patient did fall initially and had a hematoma of the right hip. We did give an injection and patient made improvement. This has been going on now for nearly a year at this time and continues to have intermittent pain. Patient states that she did finally have some instability which is new. At this point I do feel an MR arthrogram is necessary. Patient will have this done and then we will discuss further treatment options thereafter.

## 2020-04-10 ENCOUNTER — Ambulatory Visit (INDEPENDENT_AMBULATORY_CARE_PROVIDER_SITE_OTHER): Payer: 59

## 2020-04-10 ENCOUNTER — Ambulatory Visit (INDEPENDENT_AMBULATORY_CARE_PROVIDER_SITE_OTHER): Payer: 59 | Admitting: Sports Medicine

## 2020-04-10 ENCOUNTER — Other Ambulatory Visit: Payer: Self-pay

## 2020-04-10 DIAGNOSIS — M7061 Trochanteric bursitis, right hip: Secondary | ICD-10-CM

## 2020-04-10 DIAGNOSIS — G8929 Other chronic pain: Secondary | ICD-10-CM | POA: Diagnosis not present

## 2020-04-10 DIAGNOSIS — R102 Pelvic and perineal pain: Secondary | ICD-10-CM | POA: Diagnosis not present

## 2020-04-10 DIAGNOSIS — M25551 Pain in right hip: Secondary | ICD-10-CM

## 2020-04-10 DIAGNOSIS — M79651 Pain in right thigh: Secondary | ICD-10-CM | POA: Diagnosis not present

## 2020-04-10 DIAGNOSIS — S73191A Other sprain of right hip, initial encounter: Secondary | ICD-10-CM | POA: Diagnosis not present

## 2020-04-10 MED ORDER — GADOBUTROL 1 MMOL/ML IV SOLN
1.0000 mL | Freq: Once | INTRAVENOUS | Status: AC | PRN
  Administered 2020-04-10: 1 mL via INTRAVENOUS

## 2020-04-10 NOTE — Assessment & Plan Note (Signed)
Right-sided hip pain after a fall sometime ago. X-rays do show significant osteoarthritis with subchondral sclerosis of the right hip joint, historically hip joint injections have been helpful. Today I performed the injection for MR arthrography, I do suspect hip osteoarthritis as her primary pain generator though she does complain of some pain laterally as well. Certainly if we do not get good relief from the steroid and the injection today or a definitive diagnosis we can look into the lumbar spine as a pain generator, further management per primary treating provider.

## 2020-04-10 NOTE — Progress Notes (Signed)
    Procedures performed today:    Procedure: Real-time Ultrasound Guided gadolinium contrast injection of right hip joint Device: Samsung HS60  Verbal informed consent obtained.  Time-out conducted.  Noted no overlying erythema, induration, or other signs of local infection.  Skin prepped in a sterile fashion.  Local anesthesia: Topical Ethyl chloride.  With sterile technique and under real time ultrasound guidance: I advanced a 22-gauge spinal needle the femoral head/neck junction was contacted bone and then injected 1 cc kenalog 40, 2 cc lidocaine, 2 cc bupivacaine, syringe switched and 0.1 cc gadolinium injected, syringe again switched and 10 cc sterile saline used to distend the joint. Joint visualized and capsule seen distending confirming intra-articular placement of contrast material and medication. Completed without difficulty  Advised to call if fevers/chills, erythema, induration, drainage, or persistent bleeding.  Images permanently stored in PACS Impression: Technically successful ultrasound guided gadolinium contrast injection for MR arthrography.  Please see separate MR arthrogram report.   Independent interpretation of notes and tests performed by another provider:   None.  Brief History, Exam, Impression, and Recommendations:    Chronic right hip pain Right-sided hip pain after a fall sometime ago. X-rays do show significant osteoarthritis with subchondral sclerosis of the right hip joint, historically hip joint injections have been helpful. Today I performed the injection for MR arthrography, I do suspect hip osteoarthritis as her primary pain generator though she does complain of some pain laterally as well. Certainly if we do not get good relief from the steroid and the injection today or a definitive diagnosis we can look into the lumbar spine as a pain generator, further management per primary treating  provider.    ___________________________________________ Gwen Her. Dianah Field, M.D., ABFM., CAQSM. Primary Care and [place] Instructor of [place] of [facility] of [facility]

## 2020-04-12 ENCOUNTER — Encounter: Payer: Self-pay | Admitting: Family Medicine

## 2020-04-13 ENCOUNTER — Other Ambulatory Visit: Payer: Self-pay | Admitting: Internal Medicine

## 2020-04-13 MED FILL — LINZESS 145 MCG CAPSULE: 145 | 30 days supply | Qty: 30 | Fill #0

## 2020-04-14 ENCOUNTER — Encounter: Payer: Self-pay | Admitting: Family Medicine

## 2020-04-16 NOTE — Patient Instructions (Signed)
  Blood work was ordered.     Medications changes include :     Your prescription(s) have been submitted to your pharmacy. Please take as directed and contact our office if you believe you are having problem(s) with the medication(s).   A referral was ordered for        Someone from their office will call you to schedule an appointment.    Please followup in 6 months  

## 2020-04-16 NOTE — Progress Notes (Signed)
Subjective:    Patient ID: Sandra [name], female    DOB: [birth year], 59 y.o.   MRN: [id number]  HPI The patient is here for follow up of their chronic medical problems, including DM, CAD, anxiety      Medications and allergies reviewed with patient and updated if appropriate.  Patient Active Problem List   Diagnosis Date Noted  . Chronic right hip pain 04/10/2020  . Nonallopathic lesion of sacral region 08/05/2019  . Greater trochanteric bursitis, right 08/05/2019  . Benign neoplasm of descending colon   . Low back pain 05/27/2019  . Hematoma of right hip 05/07/2019  . Trigger finger, right index finger 02/17/2019  . Anxiety and depression 11/12/2018  . Left lateral epicondylitis 05/14/2018  . Left elbow pain 05/04/2018  . Right rotator cuff tear 08/20/2017  . Cervical disc disorder with radiculopathy of cervical region 04/25/2016  . Nonallopathic lesion of cervical region 04/17/2016  . Nonallopathic lesion of thoracic region 04/17/2016  . Nonallopathic lesion of rib cage 04/17/2016  . Nonallopathic lesion of lumbosacral region 04/17/2016  . Degenerative cervical disc 03/29/2016  . Degenerative arthritis of right knee 03/29/2016  . 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 Adjustment disorder with mixed anxiety and depressed mood 07/14/2014  . Personal history of colonic polyps   . 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 Chronic constipation 03/05/2013  . Coronary atherosclerosis of native coronary artery 08/27/2011  . History of uterine cancer 08/15/2011  . Diabetes ([place]) 08/15/2011  . Hypercholesterolemia 08/15/2011  . History of colonic polyps 12/29/2009    Current Outpatient Medications on File Prior to Visit  Medication Sig Dispense Refill  . albuterol (VENTOLIN HFA) 108 (90 Base) MCG/ACT inhaler Inhale 1-2 puffs into the lungs every 4 (four) hours as needed for wheezing or shortness of breath. 1 each 0  . benzonatate (TESSALON PERLES) 100 MG  capsule Take 1 capsule (100 mg total) by mouth 3 (three) times daily as needed for cough (cough). 20 capsule 0  . busPIRone (BUSPAR) 7.5 MG tablet Take 1 tablet (7.5 mg total) by mouth 3 (three) times daily. 90 tablet 5  . Cyanocobalamin (B-12) 1000 MCG SUBL Place 1,000 mcg under the tongue daily.    Marland Kitchen esomeprazole (NEXIUM) 40 MG capsule TAKE 1 CAPSULE BY MOUTH TWICE DAILY BEFORE A MEAL 180 capsule 3  . gabapentin (NEURONTIN) 300 MG capsule Take 1 capsule (300 mg total) by mouth 3 (three) times daily. 90 capsule 3  . glucose blood (FREESTYLE LITE) test strip Use as instructed 100 each 12  . glucose blood test strip Use to check blood sugars daily. 100 each 3  . LINZESS 145 MCG CAPS capsule TAKE 1 CAPSULE BY MOUTH ONCE DAILY BEFORE BREAKFAST 30 capsule 5  . Multiple Vitamins-Minerals (MULTIVITAMIN PO) Take 2 tablets by mouth daily.     . ondansetron (ZOFRAN-ODT) 4 MG disintegrating tablet DISSOLVE 1 TABLET BY MOUTH EVERY 8 HOURS AS NEEDED FOR NAUSEA OR VOMITING (MAY TAKE 2 FOR ONE INEFFECTIVE) 30 tablet 3  . sucralfate (CARAFATE) 1 g tablet Take 1 tablet (1 g total) by mouth daily as needed (acid).     No current facility-administered medications on file prior to visit.    Past Medical History:  Diagnosis Date  . Anxiety   . Arthritis   . CAD (coronary artery disease)    nonobstructive CAD in LAD per 2013 CTA coronary morph  . Cancer [facility]) 2006   uterine  . Complication of anesthesia PT  STATES HAS LETTER FROM [place] [facility]. STATING THAT SHE HAS A VERY SMALL AIRWAY (LAST SURG. HYSTERECTOMY AT [facility] IN 2006   PT STATES LYNN BEASON CRNA WHOM WORKS HERE TOLD SHE WOULD BE OKAY TO BE DONE AT [facility]  . Depression   . Diabetes mellitus    no meds taken now, checks cbg once or twice a week   . Difficult intubation    had to use the glidescope due to small airway   . 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 GERD (gastroesophageal reflux disease)   . Heart murmur    with last visit, MD  states "outgrown" murmur  . Hemorrhoids, internal, with bleeding and Grade 2 prolapse 03/05/2013   All positions seen an anoscopy RP columns banded 03/05/2013 LL and RA banded 03/18/2013     . History of endometrial cancer 2006 ---- S/P ABD. HYSTERECTOMY   STAGE I ENDOMETROID CARCINOMA  . Hx of adenomatous colonic polyps 2011   BENIGN  . Hyperlipidemia    no meds now  . Hypertension   . Morbid obesity, weight - 203, BMI - 38.4 05/12/2013  . OSA on CPAP    cpap setting of 16  . Sleep apnea    no CPAP since surgery    Past Surgical History:  Procedure Laterality Date  . ABDOMINAL HYSTERECTOMY  12-20-2004   TAH  . BILATERAL BREAST REDUCTION  11-16-1999  . BREATH TEK H PYLORI N/A 05/27/2013   Procedure: BREATH TEK H PYLORI;  Surgeon: Shann Medal, MD;  [facility];  Service: General;  Laterality: N/A;  . [place]  . COLONOSCOPY    . COLONOSCOPY N/A 04/25/2014   Procedure: COLONOSCOPY;  Surgeon: Gatha Mayer, MD;  [facility];  Service: Endoscopy;  Laterality: N/A;  . COLONOSCOPY WITH PROPOFOL N/A 07/26/2019   Procedure: COLONOSCOPY WITH PROPOFOL;  Surgeon: Gatha Mayer, MD;  [facility];  Service: Endoscopy;  Laterality: N/A;  . CYSTOSCOPY  09/06/2011   Procedure: CYSTOSCOPY;  Surgeon: Ailene Rud, MD;  [facility];  Service: Urology;  Laterality: N/A;  LYNX SLING   . ESOPHAGOGASTRODUODENOSCOPY (EGD) WITH PROPOFOL N/A 09/27/2014   Procedure: ESOPHAGOGASTRODUODENOSCOPY (EGD) WITH PROPOFOL;  Surgeon: Gatha Mayer, MD;  [facility];  Service: Endoscopy;  Laterality: N/A;  . ESOPHAGOGASTRODUODENOSCOPY (EGD) WITH PROPOFOL N/A 03/11/2016   Procedure: ESOPHAGOGASTRODUODENOSCOPY (EGD) WITH PROPOFOL;  Surgeon: Jerene Bears, MD;  [facility];  Service: Endoscopy;  Laterality: N/A;  . GASTRIC BYPASS  10/04/2013  . GASTRIC ROUX-EN-Y N/A 10/04/2013   Procedure: LAPAROSCOPIC ROUX-EN-Y GASTRIC  BYPASS WITH UPPER ENDOSCOPY;  Surgeon: Shann Medal, MD;  [facility];  Service: General;  Laterality: N/A;  . HEMORRHOID BANDING  02/2013  . POLYPECTOMY    . POLYPECTOMY  07/26/2019   Procedure: POLYPECTOMY;  Surgeon: Gatha Mayer, MD;  [facility];  Service: Endoscopy;;  . PUBOVAGINAL SLING  09/06/2011   Procedure: Gaynelle Arabian;  Surgeon: Ailene Rud, MD;  Location[facility];  Service: Urology;  Laterality: N/A;  . REDUCTION MAMMAPLASTY Bilateral 2001  . SHOULDER ARTHROSCOPY Right 10/23/2017   Procedure: RIGHT SHOULDER ARTHROSCOPIC ROTATOR CUFF REPAIR VS. DEBRIDEMENT, SUBACROMIAL DECOMPARESSION, DISTAL CLAVICAL EXCISION;  Surgeon: Tania Ade, MD;  Location: [place];  Service: Orthopedics;  Laterality: Right;  . UVULOPALATOPHARYNGOPLASTY, TONSILLECTOMY AND SEPTOPLASTY  1994    Social History   Socioeconomic History  . Marital status: Single  Spouse name: Not on file  . Number of children: Not on file  . Years of education: Not on file  . Highest education level: Not on file  Occupational History  . Not on file  Tobacco Use  . Smoking status: Never Smoker  . Smokeless tobacco: Never Used  Vaping Use  . Vaping Use: Never used  Substance and Sexual Activity  . Alcohol use: No    Alcohol/week: 0.0 standard drinks  . Drug use: No  . Sexual activity: Not Currently    Birth control/protection: Surgical    Comment: HYST.. 1st intercourse- 13, partners- 6  Other Topics Concern  . 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 BP Readings from Last 3 Encounters:  04/06/20 140/90  02/15/20 130/78  01/04/20 120/86   Wt Readings from Last 3 Encounters:  04/06/20 160 lb (72.6 kg)  02/15/20 159 lb (72.1 kg)  01/04/20 159 lb (72.1 kg)   There is no height or weight on file to calculate BMI.   Physical Exam    Constitutional: Appears well-developed and well-nourished. No distress.  HENT:  Head: Normocephalic and atraumatic.  Neck: Neck supple. No tracheal deviation present. No thyromegaly present.  No cervical lymphadenopathy Cardiovascular: Normal rate, regular rhythm and normal heart sounds.   No murmur heard. No carotid bruit .  No edema Pulmonary/Chest: Effort normal and breath sounds normal. No respiratory distress. No has no wheezes. No rales.  Skin: Skin is warm and dry. Not diaphoretic.  Psychiatric: Normal mood and affect. Behavior is normal.      Assessment & Plan:    See Problem List for Assessment and Plan of chronic medical problems.    This visit occurred during the SARS-CoV-2 public health emergency.  Safety protocols were in place, including screening questions prior to the visit, additional usage of staff PPE, and extensive cleaning of exam room while observing appropriate contact time as indicated for disinfecting solutions.    This encounter was created in error - please disregard.

## 2020-04-17 ENCOUNTER — Other Ambulatory Visit: Payer: Self-pay

## 2020-04-17 ENCOUNTER — Encounter: Payer: 59 | Admitting: Internal Medicine

## 2020-04-17 DIAGNOSIS — M7061 Trochanteric bursitis, right hip: Secondary | ICD-10-CM

## 2020-04-17 DIAGNOSIS — G8929 Other chronic pain: Secondary | ICD-10-CM

## 2020-04-18 ENCOUNTER — Encounter: Payer: Self-pay | Admitting: Internal Medicine

## 2020-04-19 ENCOUNTER — Ambulatory Visit: Payer: 59 | Admitting: Internal Medicine

## 2020-04-20 ENCOUNTER — Ambulatory Visit: Payer: 59 | Admitting: Internal Medicine

## 2020-05-01 DIAGNOSIS — M25551 Pain in right hip: Secondary | ICD-10-CM | POA: Diagnosis not present

## 2020-05-11 ENCOUNTER — Other Ambulatory Visit: Payer: Self-pay | Admitting: Obstetrics & Gynecology

## 2020-05-11 DIAGNOSIS — Z1231 Encounter for screening mammogram for malignant neoplasm of breast: Secondary | ICD-10-CM

## 2020-05-17 ENCOUNTER — Encounter: Payer: 59 | Admitting: Obstetrics & Gynecology

## 2020-05-17 ENCOUNTER — Ambulatory Visit: Payer: 59 | Admitting: Family Medicine

## 2020-05-18 ENCOUNTER — Ambulatory Visit: Payer: 59 | Admitting: Family Medicine

## 2020-05-24 DIAGNOSIS — Z1231 Encounter for screening mammogram for malignant neoplasm of breast: Secondary | ICD-10-CM

## 2020-05-31 DIAGNOSIS — M25551 Pain in right hip: Secondary | ICD-10-CM | POA: Diagnosis not present

## 2020-06-19 ENCOUNTER — Other Ambulatory Visit: Payer: Self-pay | Admitting: Family Medicine

## 2020-06-19 MED FILL — GABAPENTIN 300 MG CAPSULE: 300 | 30 days supply | Qty: 90 | Fill #0

## 2020-06-26 ENCOUNTER — Other Ambulatory Visit: Payer: Self-pay | Admitting: Internal Medicine

## 2020-06-26 MED FILL — ONDANSETRON ODT 4 MG TABLET: 4 | 5 days supply | Qty: 30 | Fill #0

## 2020-06-26 NOTE — Telephone Encounter (Signed)
Ondansetron refilled for Sandra [name].

## 2020-06-28 ENCOUNTER — Ambulatory Visit: Payer: 59 | Admitting: Obstetrics & Gynecology

## 2020-07-04 ENCOUNTER — Other Ambulatory Visit: Payer: Self-pay

## 2020-07-04 ENCOUNTER — Ambulatory Visit: Payer: Self-pay

## 2020-07-04 ENCOUNTER — Encounter: Payer: Self-pay | Admitting: Family Medicine

## 2020-07-04 ENCOUNTER — Ambulatory Visit: Payer: 59 | Admitting: Family Medicine

## 2020-07-04 VITALS — BP 102/74 | HR 71 | Ht 62.0 in | Wt 159.0 lb

## 2020-07-04 DIAGNOSIS — M545 Low back pain, unspecified: Secondary | ICD-10-CM

## 2020-07-04 DIAGNOSIS — G8929 Other chronic pain: Secondary | ICD-10-CM | POA: Diagnosis not present

## 2020-07-04 DIAGNOSIS — M999 Biomechanical lesion, unspecified: Secondary | ICD-10-CM

## 2020-07-04 DIAGNOSIS — M65321 Trigger finger, right index finger: Secondary | ICD-10-CM | POA: Diagnosis not present

## 2020-07-04 NOTE — Assessment & Plan Note (Signed)
Moderate overall, continued tightness.  Patient does have a labral tear of the hip but is doing better after an intra-articular injection by another provider.  Is responding well to osteopathic manipulation of the lower back.  Discussed posture and ergonomics.  Follow-up with me again in 2 to 3 months

## 2020-07-04 NOTE — Patient Instructions (Signed)
Injected finger Keep doing core exercises See me in 2-3 months

## 2020-07-04 NOTE — Progress Notes (Signed)
[place] Phone: [phone] Subjective:   [name], am serving as a scribe for Dr. Hulan Saas. This visit occurred during the SARS-CoV-2 public health emergency.  Safety protocols were in place, including screening questions prior to the visit, additional usage of staff PPE, and extensive cleaning of exam room while observing appropriate contact time as indicated for disinfecting solutions.   I'm seeing this patient by the request  of:  [name], MD  CC: Back pain follow-up, finger pain follow-up  ATF:[id number]  Minie Dyneisha Murchison is a 60 y.o. female coming in with complaint of right hand, 2nd finger triggering and back pain. Last injected in Sept 2021. OMT performed for patient's back December 2021. Patient states that her hip has been doing better after having injection by Dr. Stann Mainland. No longer having catch in her right hip. Does still have lower back pain that she feels is aggravated by work.        Past Medical History:  Diagnosis Date  . Anxiety   . Arthritis   . CAD (coronary artery disease)    nonobstructive CAD in LAD per 2013 CTA coronary morph  . Cancer [facility]) 2006   uterine  . Complication of anesthesia PT STATES HAS LETTER FROM [place] [facility]. STATING THAT SHE HAS A VERY SMALL AIRWAY (LAST SURG. HYSTERECTOMY AT [facility] IN 2006   PT STATES LYNN BEASON CRNA WHOM WORKS HERE TOLD SHE WOULD BE OKAY TO BE DONE AT [facility]  . Depression   . Diabetes mellitus    no meds taken now, checks cbg once or twice a week   . Difficult intubation    had to use the glidescope due to small airway   . 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 GERD (gastroesophageal reflux disease)   . Heart murmur    with last visit, MD states "outgrown" murmur  . Hemorrhoids, internal, with bleeding and Grade 2 prolapse 03/05/2013   All positions seen an anoscopy RP columns banded 03/05/2013 LL and RA banded  03/18/2013     . History of endometrial cancer 2006 ---- S/P ABD. HYSTERECTOMY   STAGE I ENDOMETROID CARCINOMA  . Hx of adenomatous colonic polyps 2011   BENIGN  . Hyperlipidemia    no meds now  . Hypertension   . Morbid obesity, weight - 203, BMI - 38.4 05/12/2013  . OSA on CPAP    cpap setting of 16  . Sleep apnea    no CPAP since surgery   Past Surgical History:  Procedure Laterality Date  . ABDOMINAL HYSTERECTOMY  12-20-2004   TAH  . BILATERAL BREAST REDUCTION  11-16-1999  . BREATH TEK H PYLORI N/A 05/27/2013   Procedure: BREATH TEK H PYLORI;  Surgeon: Shann Medal, MD;  [facility];  Service: General;  Laterality: N/A;  . [place]  . COLONOSCOPY    . COLONOSCOPY N/A 04/25/2014   Procedure: COLONOSCOPY;  Surgeon: Gatha Mayer, MD;  [facility];  Service: Endoscopy;  Laterality: N/A;  . COLONOSCOPY WITH PROPOFOL N/A 07/26/2019   Procedure: COLONOSCOPY WITH PROPOFOL;  Surgeon: Gatha Mayer, MD;  [facility];  Service: Endoscopy;  Laterality: N/A;  . CYSTOSCOPY  09/06/2011   Procedure: CYSTOSCOPY;  Surgeon: Ailene Rud, MD;  Location: [facility];  Service: Urology;  Laterality: N/A;  LYNX SLING   . ESOPHAGOGASTRODUODENOSCOPY (EGD) WITH PROPOFOL N/A 09/27/2014  Procedure: ESOPHAGOGASTRODUODENOSCOPY (EGD) WITH PROPOFOL;  Surgeon: Gatha Mayer, MD;  [facility];  Service: Endoscopy;  Laterality: N/A;  . ESOPHAGOGASTRODUODENOSCOPY (EGD) WITH PROPOFOL N/A 03/11/2016   Procedure: ESOPHAGOGASTRODUODENOSCOPY (EGD) WITH PROPOFOL;  Surgeon: Jerene Bears, MD;  [facility];  Service: Endoscopy;  Laterality: N/A;  . GASTRIC BYPASS  10/04/2013  . GASTRIC ROUX-EN-Y N/A 10/04/2013   Procedure: LAPAROSCOPIC ROUX-EN-Y GASTRIC BYPASS WITH UPPER ENDOSCOPY;  Surgeon: Shann Medal, MD;  [facility];  Service: General;  Laterality: N/A;  . HEMORRHOID BANDING  02/2013  . POLYPECTOMY    .  POLYPECTOMY  07/26/2019   Procedure: POLYPECTOMY;  Surgeon: Gatha Mayer, MD;  [facility];  Service: Endoscopy;;  . PUBOVAGINAL SLING  09/06/2011   Procedure: Gaynelle Arabian;  Surgeon: Ailene Rud, MD;  Location: [facility];  Service: Urology;  Laterality: N/A;  . REDUCTION MAMMAPLASTY Bilateral 2001  . SHOULDER ARTHROSCOPY Right 10/23/2017   Procedure: RIGHT SHOULDER ARTHROSCOPIC ROTATOR CUFF REPAIR VS. DEBRIDEMENT, SUBACROMIAL DECOMPARESSION, DISTAL CLAVICAL EXCISION;  Surgeon: Tania Ade, MD;  Location: [place];  Service: Orthopedics;  Laterality: Right;  . UVULOPALATOPHARYNGOPLASTY, TONSILLECTOMY AND SEPTOPLASTY  1994   Social History   Socioeconomic History  . Marital status: Single    Spouse name: Not on file  . Number of children: Not on file  . Years of education: Not on file  . Highest education level: Not on file  Occupational History  . Not on file  Tobacco Use  . Smoking status: Never Smoker  . Smokeless tobacco: Never Used  Vaping Use  . Vaping Use: Never used  Substance and Sexual Activity  . Alcohol use: No    Alcohol/week: 0.0 standard drinks  . Drug use: No  . Sexual activity: Not Currently    Birth control/protection: Surgical    Comment: HYST.. 1st intercourse- 13, partners- 6  Other Topics Concern  . 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 Nsaids     r/t gastric surgery  . Statins    Family History  Adopted: Yes      Current Outpatient Medications (Respiratory):  .  albuterol (VENTOLIN HFA) 108  (90 Base) MCG/ACT inhaler, Inhale 1-2 puffs into the lungs every 4 (four) hours as needed for wheezing or shortness of breath.   Current Outpatient Medications (Hematological):  Marland Kitchen  Cyanocobalamin (B-12) 1000 MCG SUBL, Place 1,000 mcg under the tongue daily.  Current Outpatient Medications (Other):  .  busPIRone (BUSPAR) 7.5 MG tablet, Take 1 tablet (7.5 mg total) by mouth 3 (three) times daily. Marland Kitchen  esomeprazole (NEXIUM) 40 MG capsule, TAKE 1 CAPSULE BY MOUTH TWICE DAILY BEFORE A MEAL .  gabapentin (NEURONTIN) 300 MG capsule, TAKE 1 CAPSULE BY MOUTH 3 TIMES DAILY. Marland Kitchen  glucose blood (FREESTYLE LITE) test strip, Use as instructed .  glucose blood test strip, Use to check blood sugars daily. Marland Kitchen  LINZESS 145 MCG CAPS capsule, TAKE 1 CAPSULE BY MOUTH ONCE DAILY BEFORE BREAKFAST .  Multiple Vitamins-Minerals (MULTIVITAMIN PO), Take 2 tablets  by mouth daily.  .  ondansetron (ZOFRAN-ODT) 4 MG disintegrating tablet, DISSOLVE 1 TABLET BY MOUTH EVERY 8 HOURS AS NEEDED FOR NAUSEA OR VOMITING (MAY TAKE 2 IF ONE INEFFECTIVE) .  sucralfate (CARAFATE) 1 g tablet, Take 1 tablet (1 g total) by mouth daily as needed (acid).   Reviewed prior external information including notes and imaging from  primary care provider As well as notes that were available from care everywhere and other healthcare systems.  Past medical history, social, surgical and family history all reviewed in electronic medical record.  No pertanent information unless stated regarding to the chief complaint.   Review of Systems:  No headache, visual changes, nausea, vomiting, diarrhea, constipation, dizziness, abdominal pain, skin rash, fevers, chills, night sweats, weight loss, swollen lymph nodes, body aches, joint swelling, chest pain, shortness of breath, mood changes. POSITIVE muscle aches  Objective  Blood pressure 102/74, pulse 71, height 5\' 2"  (1.575 m), weight 159 lb (72.1 kg), SpO2 98 %.   General: No apparent distress alert and  oriented x3 mood and affect normal, dressed appropriately.  HEENT: Pupils equal, extraocular movements intact  Respiratory: Patient's speak in full sentences and does not appear short of breath  Cardiovascular: No lower extremity edema, non tender, no erythema  Gait normal with good balance and coordination.  MSK: Patient's low back does have some loss of lordosis.  Some tenderness to palpation of the right sacroiliac joint.  Still some limited range of motion of the right hip compared to the left side.  Neurovascular intact distally.  5 out of 5 strength noted.  Mild tightness of the paraspinal musculature at the cervical thoracic junction.  Negative Spurling's though noted today.  Right hand exam shows the patient's index finger does have a trigger nodule noted at the A2 pulley.  Patient does have mild triggering noted.  Tender to palpation in the area.  Otherwise unremarkable.  Osteopathic findings C4 flexed rotated and side bent right T4 extended rotated and side bent right with inhaled rib  L3 flexed rotated and side bent right Sacrum right on right  Procedure: Real-time Ultrasound Guided Injection of right index finger flexor tendon sheath Device: GE Logiq Q7 Ultrasound guided injection is preferred based studies that show increased duration, increased effect, greater accuracy, decreased procedural pain, increased response rate, and decreased cost with ultrasound guided versus blind injection.  Verbal informed consent obtained.  Time-out conducted.  Noted no overlying erythema, induration, or other signs of local infection.  Skin prepped in a sterile fashion.  Local anesthesia: Topical Ethyl chloride.  With sterile technique and under real time ultrasound guidance: With a 25-gauge half inch needle injected with 0.5 cc of 0.5% Marcaine and 0.5 cc of Kenalog 40 mg/mL Completed without difficulty  Pain immediately improved suggesting accurate placement of the medication.  Advised to call  if fevers/chills, erythema, induration, drainage, or persistent bleeding.  Impression: Technically successful ultrasound guided injection. Impression and Recommendations:     The above documentation has been reviewed and is accurate and complete Lyndal Pulley, DO

## 2020-07-04 NOTE — Assessment & Plan Note (Signed)

## 2020-07-04 NOTE — Assessment & Plan Note (Signed)
Patient given injection and tolerated the procedure well.  Continue bracing.  Patient wants to avoid surgical intervention.  Discussed icing regimen.  No change in medications.  Follow-up again 2 months

## 2020-07-06 ENCOUNTER — Ambulatory Visit: Payer: 59

## 2020-07-07 ENCOUNTER — Telehealth: Payer: Self-pay | Admitting: Internal Medicine

## 2020-07-07 MED ORDER — BETAMETHASONE DIPROPIONATE AUG 0.05 % EX CREA: TOPICAL_CREAM | Freq: Two times a day (BID) | CUTANEOUS | 1 refills | Status: DC

## 2020-07-07 NOTE — Telephone Encounter (Signed)
contact dermatitis right forearm - advised use Tecnu in future  Treat w/ betamethasone

## 2020-07-18 ENCOUNTER — Other Ambulatory Visit (HOSPITAL_BASED_OUTPATIENT_CLINIC_OR_DEPARTMENT_OTHER): Payer: Self-pay

## 2020-07-20 ENCOUNTER — Ambulatory Visit: Payer: 59 | Admitting: Family Medicine

## 2020-07-25 ENCOUNTER — Ambulatory Visit: Payer: 59 | Admitting: Family Medicine

## 2020-08-24 ENCOUNTER — Ambulatory Visit (INDEPENDENT_AMBULATORY_CARE_PROVIDER_SITE_OTHER): Payer: 59 | Admitting: Obstetrics & Gynecology

## 2020-08-24 ENCOUNTER — Encounter: Payer: Self-pay | Admitting: Obstetrics & Gynecology

## 2020-08-24 ENCOUNTER — Other Ambulatory Visit: Payer: Self-pay

## 2020-08-24 ENCOUNTER — Other Ambulatory Visit (HOSPITAL_COMMUNITY)
Admission: RE | Admit: 2020-08-24 | Discharge: 2020-08-24 | Disposition: A | Discharge status: Home / Self Care | Payer: 59 | Source: Ambulatory Visit | Attending: Obstetrics & Gynecology | Admitting: Obstetrics & Gynecology

## 2020-08-24 VITALS — BP 118/80 | Ht 62.0 in | Wt 160.0 lb

## 2020-08-24 DIAGNOSIS — Z1272 Encounter for screening for malignant neoplasm of vagina: Secondary | ICD-10-CM | POA: Diagnosis not present

## 2020-08-24 DIAGNOSIS — Z780 Asymptomatic menopausal state: Secondary | ICD-10-CM

## 2020-08-24 DIAGNOSIS — Z8542 Personal history of malignant neoplasm of other parts of uterus: Secondary | ICD-10-CM | POA: Diagnosis not present

## 2020-08-24 DIAGNOSIS — Z01419 Encounter for gynecological examination (general) (routine) without abnormal findings: Secondary | ICD-10-CM

## 2020-08-24 DIAGNOSIS — Z90710 Acquired absence of both cervix and uterus: Secondary | ICD-10-CM | POA: Diagnosis not present

## 2020-08-24 DIAGNOSIS — L659 Nonscarring hair loss, unspecified: Secondary | ICD-10-CM | POA: Diagnosis not present

## 2020-08-24 NOTE — Progress Notes (Signed)
Sandra [name] 02-Apr-1961 [id number]   History:    60 y.o. G3P2A1L2 Divorced.  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 Preserved her ovaries. Postmenopause, well on no HRT. Pap tests negative every year. Last Pelvic US negative in 2017. No pelvic pain.Urine and bowel movements normal.Abstinent.  Complains of hair loss recently.  Possibly associated with stress of divorce. Breasts wnl. BMI 29.26. Health labs with Fam MD.  Past medical history,surgical history, family history and social history were all reviewed and documented in the EPIC chart.  Gynecologic History No LMP recorded. Patient has had a hysterectomy.  Obstetric History OB History  Gravida Para Term Preterm AB Living  3 2     1 2   SAB IAB Ectopic Multiple Live Births  1            # Outcome Date GA Lbr Len/2nd Weight Sex Delivery Anes PTL Lv  3 SAB           2 Para           1 Para              ROS: A ROS was performed and pertinent positives and negatives are included in the history.  GENERAL: No fevers or chills. HEENT: No change in vision, no earache, sore throat or sinus congestion. NECK: No pain or stiffness. CARDIOVASCULAR: No chest pain or pressure. No palpitations. PULMONARY: No shortness of breath, cough or wheeze. GASTROINTESTINAL: No abdominal pain, nausea, vomiting or diarrhea, melena or bright red blood per rectum. GENITOURINARY: No urinary frequency, urgency, hesitancy or dysuria. MUSCULOSKELETAL: No joint or muscle pain, no back pain, no recent trauma. DERMATOLOGIC: No rash, no itching, no lesions. ENDOCRINE: No polyuria, polydipsia, no heat or cold intolerance. No recent change in weight. HEMATOLOGICAL: No anemia or easy bruising or bleeding. NEUROLOGIC: No headache, seizures, numbness, tingling or weakness. PSYCHIATRIC: No depression, no loss of interest in  normal activity or change in sleep pattern.     Exam:   BP 118/80 (BP Location: Right Arm, Patient Position: Sitting, Cuff Size: Normal)   Ht 5\' 2"  (1.575 m)   Wt 160 lb (72.6 kg)   BMI 29.26 kg/m   Body mass index is 29.26 kg/m.  General appearance : Well developed well nourished female. No acute distress HEENT: Eyes: no retinal hemorrhage or exudates,  Neck supple, trachea midline, no carotid bruits, no thyroidmegaly Lungs: Clear to auscultation, no rhonchi or wheezes, or rib retractions  Heart: Regular rate and rhythm, no murmurs or gallops Breast:Examined in sitting and supine position were symmetrical in appearance, no palpable masses or tenderness,  no skin retraction, no nipple inversion, no nipple discharge, no skin discoloration, no axillary or supraclavicular lymphadenopathy Abdomen: no palpable masses or tenderness, no rebound or guarding Extremities: no edema or skin discoloration or tenderness  Pelvic: Vulva: Normal             Vagina: No gross lesions or discharge  Cervix: No gross lesions or discharge  Uterus AV, normal size, shape and consistency, non-tender and mobile  Adnexa  Without masses or tenderness  Anus: Normal   Assessment/Plan:  60 y.o. female for annual exam   1. Encounter for Papanicolaou smear of vagina as part of routine gynecological examination Gynecologic exam status post TAH for endometrial cancer in 2006.  Pap test have been negative since then.  Pap reflex done  at the vaginal vault today.  Breast exam normal.  Will schedule a screening mammogram now.  Colonoscopy March 2021.  Health labs with family physician. - Cytology - PAP( [place])  2. S/P TAH (total abdominal hysterectomy)  3. History of uterine cancer No pelvic pain.  Pap reflex at vaginal vault done. - Cytology - PAP( [place])  4. Postmenopause Well on no hormone replacement therapy.  Vitamin D supplements, calcium intake of 1.5 g/day total and regular weightbearing  physical activities recommended.  5. Hair loss Possibly associated with increased stress with recent divorce.  Counseling done on the hair loss.  Recommend washing hair with warm water, not hot and avoiding over combing her hair.  TSH normal with family physician.  Nutritional recommendations made.  Will observe for 3 months and if not improved will refer to dermatology.  Princess Bruins MD, 11:43 AM 08/24/2020

## 2020-08-25 LAB — CYTOLOGY - PAP: Diagnosis: NEGATIVE

## 2020-09-04 NOTE — Progress Notes (Signed)
[place] Phone: [phone] Subjective:   [name], am serving as a scribe for Dr. Hulan Saas. This visit occurred during the SARS-CoV-2 public health emergency.  Safety protocols were in place, including screening questions prior to the visit, additional usage of staff PPE, and extensive cleaning of exam room while observing appropriate contact time as indicated for disinfecting solutions.   I'm seeing this patient by the request  of:  [name], MD  CC: Back pain, neck pain follow-up, trigger pain follow-up  [id number]  Ladelle Aminah Zabawa is a 60 y.o. female coming in with complaint of back and neck pain. OMT 07/04/2020. Trigger finger, R index. Patient states that her trigger finger went away with injection.  Patient states that the gabapentin is helpful for the neck and back.  Patient is doing relatively well though otherwise.  Tightness noted in the lower back.  Doing more activity at work.  Medications patient has been prescribed: Gabapentin 300mg           Reviewed prior external information including notes and imaging from previsou exam, outside providers and external EMR if available.   As well as notes that were available from care everywhere and other healthcare systems.  Past medical history, social, surgical and family history all reviewed in electronic medical record.  No pertanent information unless stated regarding to the chief complaint.   Past Medical History:  Diagnosis Date  . Anxiety   . Arthritis   . CAD (coronary artery disease)    nonobstructive CAD in LAD per 2013 CTA coronary morph  . Cancer [facility]) 2006   uterine  . Complication of anesthesia PT STATES HAS LETTER FROM [place] [facility]. STATING THAT SHE HAS A VERY SMALL AIRWAY (LAST SURG. [facility]STERECTOMY AT [facility] IN 2006   PT STATES LYNN BEASON CRNA WHOM WORKS HERE TOLD SHE WOULD BE OKAY TO BE DONE AT [facility]  . Depression    . Diabetes mellitus    no meds taken now, checks cbg once or twice a week   . Difficult intubation    had to use the glidescope due to small airway   . 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 GERD (gastroesophageal reflux disease)   . Heart murmur    with last visit, MD states "outgrown" murmur  . Hemorrhoids, internal, with bleeding and Grade 2 prolapse 03/05/2013   All positions seen an anoscopy RP columns banded 03/05/2013 LL and RA banded 03/18/2013     . History of endometrial cancer 2006 ---- S/P ABD. HYSTERECTOMY   STAGE I ENDOMETROID CARCINOMA  . Hx of adenomatous colonic polyps 2011   BENIGN  . Hyperlipidemia    no meds now  . Hypertension   . Morbid obesity, weight - 203, BMI - 38.4 05/12/2013  . OSA on CPAP    cpap setting of 16  . Sleep apnea    no CPAP since surgery    Allergies  Allergen Reactions  . Nsaids     r/t gastric surgery  . Statins      Review of Systems:  No headache, visual changes, nausea, vomiting, diarrhea, constipation, dizziness, abdominal pain, skin rash, fevers, chills, night sweats, weight loss, swollen lymph nodes,  joint swelling, chest pain, shortness of breath, mood changes. POSITIVE muscle aches, body aches  Objective  Blood pressure 128/76, pulse 62, height 5\' 2"  (1.575 m), SpO2 98 %.   General: No apparent distress alert  and oriented x3 mood and affect normal, dressed appropriately.  HEENT: Pupils equal, extraocular movements intact  Respiratory: Patient's speak in full sentences and does not appear short of breath  Cardiovascular: No lower extremity edema, non tender, no erythema  Gait normal with good balance and coordination.  MSK:  Non tender with full range of motion and good stability and symmetric strength and tone of shoulders, elbows, wrist, hip, knee and ankles bilaterally.  Back -back exam does have some mild loss of lordosis.  Some tenderness to palpation of the paraspinal musculature.  Tightness noted in the  right hip flexor compared to the left side.  Mild tightness with FABER test as well.  Osteopathic findings  C5 flexed rotated and side bent left T3 extended rotated and side bent right inhaled rib T9 extended rotated and side bent left L2 flexed rotated and side bent right Sacrum right on right       Assessment and Plan:  Cervical disc disorder with radiculopathy of cervical region Patient does have some degenerative disc disease.  Patient does respond relatively well though to osteopathic manipulation.  Focus more on muscle energy of the neck though.  Did more HVLA on the lower back.  Continue the gabapentin.  Discussed icing regimen.  Follow-up again in 6 to 8 weeks  Low back pain Chronic problem with mild exacerbation.  Tightness of the hip flexor noted no need to make any other significant changes.  Continue the gabapentin.  Responded well to manipulation.  Follow-up again in 6 to 8 weeks patient does have known spondylolisthesis at L4-L5 and we will continue to monitor.  Any radicular symptoms we will consider advanced imaging.     Nonallopathic problems  Decision today to treat with OMT was based on Physical Exam  After verbal consent patient was treated with HVLA, ME, FPR techniques in cervical, rib, thoracic, lumbar, and sacral  areas  Patient tolerated the procedure well with improvement in symptoms  Patient given exercises, stretches and lifestyle modifications  See medications in patient instructions if given  Patient will follow up in 4-8 weeks      The above documentation has been reviewed and is accurate and complete Lyndal Pulley, DO       Note: This dictation was prepared with Dragon dictation along with smaller phrase technology. Any transcriptional errors that result from this process are unintentional.

## 2020-09-06 ENCOUNTER — Encounter: Payer: Self-pay | Admitting: Family Medicine

## 2020-09-06 ENCOUNTER — Ambulatory Visit: Payer: 59 | Admitting: Family Medicine

## 2020-09-06 ENCOUNTER — Other Ambulatory Visit: Payer: Self-pay

## 2020-09-06 VITALS — BP 128/76 | HR 62 | Ht 62.0 in

## 2020-09-06 DIAGNOSIS — M9904 Segmental and somatic dysfunction of sacral region: Secondary | ICD-10-CM | POA: Diagnosis not present

## 2020-09-06 DIAGNOSIS — M545 Low back pain, unspecified: Secondary | ICD-10-CM

## 2020-09-06 DIAGNOSIS — M9901 Segmental and somatic dysfunction of cervical region: Secondary | ICD-10-CM | POA: Diagnosis not present

## 2020-09-06 DIAGNOSIS — M9903 Segmental and somatic dysfunction of lumbar region: Secondary | ICD-10-CM

## 2020-09-06 DIAGNOSIS — M5010 Cervical disc disorder with radiculopathy, unspecified cervical region: Secondary | ICD-10-CM | POA: Diagnosis not present

## 2020-09-06 DIAGNOSIS — M9908 Segmental and somatic dysfunction of rib cage: Secondary | ICD-10-CM | POA: Diagnosis not present

## 2020-09-06 DIAGNOSIS — G8929 Other chronic pain: Secondary | ICD-10-CM | POA: Diagnosis not present

## 2020-09-06 DIAGNOSIS — M9902 Segmental and somatic dysfunction of thoracic region: Secondary | ICD-10-CM

## 2020-09-06 NOTE — Assessment & Plan Note (Signed)
Chronic problem with mild exacerbation.  Tightness of the hip flexor noted no need to make any other significant changes.  Continue the gabapentin.  Responded well to manipulation.  Follow-up again in 6 to 8 weeks patient does have known spondylolisthesis at L4-L5 and we will continue to monitor.  Any radicular symptoms we will consider advanced imaging.

## 2020-09-06 NOTE — Patient Instructions (Signed)
Hip flexors are tight See me again in 2 months

## 2020-09-06 NOTE — Assessment & Plan Note (Signed)
Patient does have some degenerative disc disease.  Patient does respond relatively well though to osteopathic manipulation.  Focus more on muscle energy of the neck though.  Did more HVLA on the lower back.  Continue the gabapentin.  Discussed icing regimen.  Follow-up again in 6 to 8 weeks

## 2020-09-18 ENCOUNTER — Other Ambulatory Visit (HOSPITAL_BASED_OUTPATIENT_CLINIC_OR_DEPARTMENT_OTHER): Payer: Self-pay

## 2020-09-29 ENCOUNTER — Other Ambulatory Visit (HOSPITAL_COMMUNITY): Payer: Self-pay

## 2020-09-29 MED FILL — Gabapentin Cap 300 MG: ORAL | 30 days supply | Qty: 90 | Fill #0 | Status: AC

## 2020-10-09 ENCOUNTER — Other Ambulatory Visit (HOSPITAL_COMMUNITY): Payer: Self-pay

## 2020-10-12 ENCOUNTER — Other Ambulatory Visit (HOSPITAL_COMMUNITY): Payer: Self-pay

## 2020-10-12 MED FILL — Ondansetron Orally Disintegrating Tab 4 MG: ORAL | 10 days supply | Qty: 30 | Fill #0 | Status: AC

## 2020-11-06 NOTE — Progress Notes (Signed)
[place] Phone: [phone] Subjective:   [name], am serving as a scribe for Dr. Hulan Saas.  This visit occurred during the SARS-CoV-2 public health emergency.  Safety protocols were in place, including screening questions prior to the visit, additional usage of staff PPE, and extensive cleaning of exam room while observing appropriate contact time as indicated for disinfecting solutions.   I'm seeing this patient by the request  of:  Burns, Claudina Lick, MD  CC: Hand and arm pain.  [id number]  Jocelyn Kevina Piloto is a 60 y.o. female coming in with complaint of back and neck pain. OMT 09/06/2020. Patient states that her back and hips have been bothering her more recently. Had injection with Dr. Stann Mainland in December. Pain increases in back with legs extended in reclined position.   Patient states that her R hand pain increased drastically on Sunday. Pain was so bad that she was unable to grasp items. Pain from R elbow into forearm.   Medications patient has been prescribed: Gabapentin  Taking: Yes         Reviewed prior external information including notes and imaging from previsou exam, outside providers and external EMR if available.   As well as notes that were available from care everywhere and other healthcare systems.  Past medical history, social, surgical and family history all reviewed in electronic medical record.  No pertanent information unless stated regarding to the chief complaint.   Past Medical History:  Diagnosis Date   Anxiety    Arthritis    CAD (coronary artery disease)    nonobstructive CAD in LAD per 2013 CTA coronary morph   Cancer ([place]) 2006   uterine   Complication of anesthesia PT STATES HAS LETTER FROM [place] [facility]. STATING THAT SHE HAS A VERY SMALL AIRWAY (LAST SURG. HYSTERECTOMY AT [facility] IN 2006   PT STATES LYNN BEASON CRNA WHOM WORKS HERE TOLD SHE WOULD BE OKAY TO  BE DONE AT [facility]   Depression    Diabetes mellitus    no meds taken now, checks cbg once or twice a week    Difficult intubation    had to use the glidescope due to small airway    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   marginal ulcer   GERD (gastroesophageal reflux disease)    Heart murmur    with last visit, MD states "outgrown" murmur   Hemorrhoids, internal, with bleeding and Grade 2 prolapse 03/05/2013   All positions seen an anoscopy RP columns banded 03/05/2013 LL and RA banded 03/18/2013      History of endometrial cancer 2006 ---- S/P ABD. HYSTERECTOMY   STAGE I ENDOMETROID CARCINOMA   Hx of adenomatous colonic polyps 2011   BENIGN   Hyperlipidemia    no meds now   Hypertension    Morbid obesity, weight - 203, BMI - 38.4 05/12/2013   OSA on CPAP    cpap setting of 16   Sleep apnea    no CPAP since surgery    Allergies  Allergen Reactions   Nsaids     r/t gastric surgery   Statins      Review of Systems:  No headache, visual changes, nausea, vomiting, diarrhea, constipation, dizziness, abdominal pain, skin rash, fevers, chills, night sweats, weight loss, swollen lymph nodes, body aches, joint swelling, chest pain, shortness of breath, mood changes. POSITIVE muscle aches  Objective  Blood pressure 122/78, pulse 63, height 5\' 2"  (1.575 m),  weight 156 lb (70.8 kg), SpO2 98 %.   General: No apparent distress alert and oriented x3 mood and affect normal, dressed appropriately.  HEENT: Pupils equal, extraocular movements intact  Respiratory: Patient's speak in full sentences and does not appear short of breath  Cardiovascular: No lower extremity edema, non tender, no erythema  Gait normal with good balance and coordination.  Back -low back exam does have some loss of lordosis and noted patient does have tightness noted.  Tightness noted in the paraspinal region as well.  Tightness with straight leg test bilaterally. Neck exam does have some mild loss of lordosis.  Crepitus noted  with sidebending bilaterally negative Spurling's. Positive Tinel's on the right hand.  Osteopathic findings  C3 flexed rotated and side bent right C6 flexed rotated and side bent left T3 extended rotated and side bent right inhaled rib T11 extended rotated and side bent left L2 flexed rotated and side bent right Sacrum right on right  Procedure: Real-time Ultrasound Guided Injection of right carpal tunnel Device: GE Logiq Q7  Ultrasound guided injection is preferred based studies that show increased duration, increased effect, greater accuracy, decreased procedural pain, increased response rate with ultrasound guided versus blind injection.  Verbal informed consent obtained.  Time-out conducted.  Noted no overlying erythema, induration, or other signs of local infection.  Skin prepped in a sterile fashion.  Local anesthesia: Topical Ethyl chloride.  With sterile technique and under real time ultrasound guidance:  median nerve visualized.  23g 5/8 inch needle inserted distal to proximal approach into nerve sheath. Pictures taken nfor needle placement. Patient did have injection of 0.5 cc of 0.5% Marcaine, and 0.5 cc of Kenalog 40 mg/dL. Completed without difficulty  Pain immediately resolved suggesting accurate placement of the medication.  Advised to call if fevers/chills, erythema, induration, drainage, or persistent bleeding.  Impression: Technically successful ultrasound guided injection.  Procedure: Real-time Ultrasound Guided Injection of right hip Device: GE Logiq Q7  Ultrasound guided injection is preferred based studies that show increased duration, increased effect, greater accuracy, decreased procedural pain, increased response rate with ultrasound guided versus blind injection.  Verbal informed consent obtained.  Time-out conducted.  Noted no overlying erythema, induration, or other signs of local infection.  Skin prepped in a sterile fashion.  Local anesthesia: Topical  Ethyl chloride.  With sterile technique and under real time ultrasound guidance:  Anterior capsule visualized, needle visualized going to the head neck junction at the anterior capsule. Pictures taken. Patient did have injection of  3 cc of 0.5% Marcaine, and 1 cc of Kenalog 40 mg/dL. Completed without difficulty  Pain immediately resolved suggesting accurate placement of the medication.  Advised to call if fevers/chills, erythema, induration, drainage, or persistent bleeding.  Images permanently stored and available for review in the ultrasound unit.  Impression: Technically successful ultrasound guided injection.   Assessment and Plan:  Cervical disc disorder with radiculopathy of cervical region Chronic, with mild exacerbation.  Responding well though to osteopathic manipulation.  Continue the gabapentin.  Discussed home exercises and icing regimen.  Follow-up again in 6 to 8 weeks  Right carpal tunnel syndrome Injection given today. Brace given, home exercises encouraged.  Discussed which activities to do which wants to avoid.  Discussed how repetitive motions occur.  Patient will do bracing fairly regularly.  Follow-up with me again 6 to 8 weeks  Labral tear of right hip joint Patient given injection and tolerated the procedure well.  Hopefully this will be beneficial.  Patient likely  will need a hip replacement at some point in the near future.  Hopefully this does help.  Patient will monitor her blood sugars.  Follow-up with me again in 6 to 8 weeks   Nonallopathic problems  Decision today to treat with OMT was based on Physical Exam  After verbal consent patient was treated with HVLA, ME, FPR techniques in cervical, rib, thoracic, lumbar, and sacral  areas  Patient tolerated the procedure well with improvement in symptoms  Patient given exercises, stretches and lifestyle modifications  See medications in patient instructions if given  Patient will follow up in 4-8 weeks       The above documentation has been reviewed and is accurate and complete Lyndal Pulley, DO       Note: This dictation was prepared with Dragon dictation along with smaller phrase technology. Any transcriptional errors that result from this process are unintentional.

## 2020-11-10 ENCOUNTER — Ambulatory Visit: Payer: Self-pay

## 2020-11-10 ENCOUNTER — Encounter: Payer: Self-pay | Admitting: Family Medicine

## 2020-11-10 ENCOUNTER — Ambulatory Visit: Payer: 59 | Admitting: Family Medicine

## 2020-11-10 ENCOUNTER — Other Ambulatory Visit: Payer: Self-pay

## 2020-11-10 VITALS — BP 122/78 | HR 63 | Ht 62.0 in | Wt 156.0 lb

## 2020-11-10 DIAGNOSIS — M5010 Cervical disc disorder with radiculopathy, unspecified cervical region: Secondary | ICD-10-CM

## 2020-11-10 DIAGNOSIS — M9903 Segmental and somatic dysfunction of lumbar region: Secondary | ICD-10-CM

## 2020-11-10 DIAGNOSIS — G5601 Carpal tunnel syndrome, right upper limb: Secondary | ICD-10-CM | POA: Insufficient documentation

## 2020-11-10 DIAGNOSIS — M9908 Segmental and somatic dysfunction of rib cage: Secondary | ICD-10-CM

## 2020-11-10 DIAGNOSIS — M79641 Pain in right hand: Secondary | ICD-10-CM | POA: Diagnosis not present

## 2020-11-10 DIAGNOSIS — M9901 Segmental and somatic dysfunction of cervical region: Secondary | ICD-10-CM

## 2020-11-10 DIAGNOSIS — M9902 Segmental and somatic dysfunction of thoracic region: Secondary | ICD-10-CM | POA: Diagnosis not present

## 2020-11-10 DIAGNOSIS — M9904 Segmental and somatic dysfunction of sacral region: Secondary | ICD-10-CM | POA: Diagnosis not present

## 2020-11-10 DIAGNOSIS — S73191A Other sprain of right hip, initial encounter: Secondary | ICD-10-CM | POA: Diagnosis not present

## 2020-11-10 NOTE — Patient Instructions (Addendum)
Injected hip and wrist today Wrist brace at night Exercises See me again in 5-7 weeks

## 2020-11-10 NOTE — Assessment & Plan Note (Signed)
Injection given today. Brace given, home exercises encouraged.  Discussed which activities to do which wants to avoid.  Discussed how repetitive motions occur.  Patient will do bracing fairly regularly.  Follow-up with me again 6 to 8 weeks

## 2020-11-10 NOTE — Assessment & Plan Note (Signed)
Patient given injection and tolerated the procedure well.  Hopefully this will be beneficial.  Patient likely will need a hip replacement at some point in the near future.  Hopefully this does help.  Patient will monitor her blood sugars.  Follow-up with me again in 6 to 8 weeks

## 2020-11-10 NOTE — Assessment & Plan Note (Signed)
Chronic, with mild exacerbation.  Responding well though to osteopathic manipulation.  Continue the gabapentin.  Discussed home exercises and icing regimen.  Follow-up again in 6 to 8 weeks

## 2020-12-20 NOTE — Progress Notes (Deleted)
[place] Phone: [phone] Subjective:    I'm seeing this patient by the request  of:  Burns, Claudina Lick, MD  CC:   [id number]  Sandra [name] is a 60 y.o. female coming in with complaint of back and neck pain. OMT 11/10/2020. R hip and R carpal tunnel f/u as well. Injected hip and carpal tunnel last visit. Patient states   Medications patient has been prescribed: Gabapentin   Taking:         Reviewed prior external information including notes and imaging from previsou exam, outside providers and external EMR if available.   As well as notes that were available from care everywhere and other healthcare systems.  Past medical history, social, surgical and family history all reviewed in electronic medical record.  No pertanent information unless stated regarding to the chief complaint.   Past Medical History:  Diagnosis Date   Anxiety    Arthritis    CAD (coronary artery disease)    nonobstructive CAD in LAD per 2013 CTA coronary morph   Cancer ([place]) 2006   uterine   Complication of anesthesia PT STATES HAS LETTER FROM [place] [facility]. STATING THAT SHE HAS A VERY SMALL AIRWAY (LAST SURG. HYSTERECTOMY AT [facility] IN 2006   PT STATES LYNN BEASON CRNA WHOM WORKS HERE TOLD SHE WOULD BE OKAY TO BE DONE AT [facility]   Depression    Diabetes mellitus    no meds taken now, checks cbg once or twice a week    Difficult intubation    had to use the glidescope due to small airway    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   marginal ulcer   GERD (gastroesophageal reflux disease)    Heart murmur    with last visit, MD states "outgrown" murmur   Hemorrhoids, internal, with bleeding and Grade 2 prolapse 03/05/2013   All positions seen an anoscopy RP columns banded 03/05/2013 LL and RA banded 03/18/2013      History of endometrial cancer 2006 ---- S/P ABD. HYSTERECTOMY   STAGE I ENDOMETROID CARCINOMA   Hx of adenomatous colonic  polyps 2011   BENIGN   Hyperlipidemia    no meds now   Hypertension    Morbid obesity, weight - 203, BMI - 38.4 05/12/2013   OSA on CPAP    cpap setting of 16   Sleep apnea    no CPAP since surgery    Allergies  Allergen Reactions   Nsaids     r/t gastric surgery   Statins      Review of Systems:  No headache, visual changes, nausea, vomiting, diarrhea, constipation, dizziness, abdominal pain, skin rash, fevers, chills, night sweats, weight loss, swollen lymph nodes, body aches, joint swelling, chest pain, shortness of breath, mood changes. POSITIVE muscle aches  Objective  There were no vitals taken for this visit.   General: No apparent distress alert and oriented x3 mood and affect normal, dressed appropriately.  HEENT: Pupils equal, extraocular movements intact  Respiratory: Patient's speak in full sentences and does not appear short of breath  Cardiovascular: No lower extremity edema, non tender, no erythema  Neuro: Cranial nerves II through XII are intact, neurovascularly intact in all extremities with 2+ DTRs and 2+ pulses.  Gait normal with good balance and coordination.  MSK:  Non tender with full range of motion and good stability and symmetric strength and tone of shoulders, elbows, wrist, hip, knee and ankles bilaterally.  Back -  Normal skin, Spine with normal alignment and no deformity.  No tenderness to vertebral process palpation.  Paraspinous muscles are not tender and without spasm.   Range of motion is full at neck and lumbar sacral regions  Osteopathic findings  C2 flexed rotated and side bent right C6 flexed rotated and side bent left T3 extended rotated and side bent right inhaled rib T9 extended rotated and side bent left L2 flexed rotated and side bent right Sacrum right on right       Assessment and Plan:    Nonallopathic problems  Decision today to treat with OMT was based on Physical Exam  After verbal consent patient was treated with  HVLA, ME, FPR techniques in cervical, rib, thoracic, lumbar, and sacral  areas  Patient tolerated the procedure well with improvement in symptoms  Patient given exercises, stretches and lifestyle modifications  See medications in patient instructions if given  Patient will follow up in 4-8 weeks      The above documentation has been reviewed and is accurate and complete Jacqualin Combes       Note: This dictation was prepared with Dragon dictation along with smaller phrase technology. Any transcriptional errors that result from this process are unintentional.

## 2020-12-21 ENCOUNTER — Ambulatory Visit: Payer: 59 | Admitting: Family Medicine

## 2021-01-01 ENCOUNTER — Other Ambulatory Visit (HOSPITAL_COMMUNITY): Payer: Self-pay

## 2021-01-01 MED FILL — Gabapentin Cap 300 MG: ORAL | 30 days supply | Qty: 90 | Fill #1 | Status: AC

## 2021-01-02 ENCOUNTER — Other Ambulatory Visit (HOSPITAL_COMMUNITY): Payer: Self-pay

## 2021-01-05 ENCOUNTER — Other Ambulatory Visit: Payer: Self-pay | Admitting: Internal Medicine

## 2021-01-05 ENCOUNTER — Other Ambulatory Visit (HOSPITAL_COMMUNITY): Payer: Self-pay

## 2021-01-05 MED ORDER — FREESTYLE LITE TEST VI STRP
ORAL_STRIP | 12 refills | Status: DC
  Filled 2021-01-05 – 2021-01-15 (×2): qty 100, 90d supply, fill #0

## 2021-01-15 ENCOUNTER — Other Ambulatory Visit (HOSPITAL_COMMUNITY): Payer: Self-pay

## 2021-01-17 DIAGNOSIS — S0511XA Contusion of eyeball and orbital tissues, right eye, initial encounter: Secondary | ICD-10-CM | POA: Diagnosis not present

## 2021-01-17 DIAGNOSIS — H35371 Puckering of macula, right eye: Secondary | ICD-10-CM | POA: Diagnosis not present

## 2021-01-17 DIAGNOSIS — Z961 Presence of intraocular lens: Secondary | ICD-10-CM | POA: Diagnosis not present

## 2021-01-17 DIAGNOSIS — H40013 Open angle with borderline findings, low risk, bilateral: Secondary | ICD-10-CM | POA: Diagnosis not present

## 2021-01-18 NOTE — Progress Notes (Signed)
[place] [address] Phone: [phone] Subjective:   [name], am serving as a scribe for Dr. Hulan Saas. This visit occurred during the SARS-CoV-2 public health emergency.  Safety protocols were in place, including screening questions prior to the visit, additional usage of staff PPE, and extensive cleaning of exam room while observing appropriate contact time as indicated for disinfecting solutions.   I'm seeing this patient by the request  of:  [name], MD  CC: Neck pain follow-up, finger pain  HEN:[id number]  Sandra [name] is a 60 y.o. female coming in with complaint of back and neck pain. OMT on 11/10/2020. Patient was also seen for hand pain. Injection given today. Brace given, home exercises encouraged.  Discussed which activities to do which wants to avoid.  Discussed how repetitive motions occur.  Patient will do bracing fairly regularly.  Follow-up with me again 6 to 8 weeks  Patient states that her R hand pointer finger is locking up on her for past week. Last injection March 2022.  Starting to have worsening pain again.  Affecting daily activities.  Increase in neck pain recently as well.  Patient states that it is about the same as usual.  Maybe some mild degree of aggravation.  Has been working more and does notice that positioning seems to aggravate it more.          The above documentation has been reviewed and is accurate and complete Lyndal Pulley, DO   Past Medical History:  Diagnosis Date   Anxiety    Arthritis    CAD (coronary artery disease)    nonobstructive CAD in LAD per 2013 CTA coronary morph   Cancer ([place]) 2006   uterine   Complication of anesthesia PT [place]. STATING THAT SHE HAS A VERY SMALL AIRWAY (LAST SURG. 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 HYSTERECTOMY   STAGE I ENDOMETROID CARCINOMA   Hx of adenomatous colonic polyps 2011   BENIGN   Hyperlipidemia    no meds now   Hypertension    Morbid obesity, weight - 203, BMI - 38.4 05/12/2013   OSA on CPAP    cpap setting of 16   Sleep apnea    no CPAP since surgery    Allergies  Allergen Reactions   Nsaids     r/t gastric surgery   Statins      Review of Systems:  No headache, visual changes, nausea, vomiting, diarrhea, constipation, dizziness, abdominal pain, skin rash, fevers, chills, night sweats, weight loss, swollen lymph nodes, body aches, joint swelling, chest pain, shortness of breath, mood changes. POSITIVE muscle aches  Objective  Blood pressure 122/82, pulse  76, height 5\' 2"  (1.575 m), weight 151 lb (68.5 kg), peak flow 98 L/min.   General: No apparent distress alert and oriented x3 mood and affect normal, dressed appropriately.  HEENT: Pupils equal, extraocular movements intact  Respiratory: Patient's speak in full sentences and does not appear short of breath  Cardiovascular: No lower extremity edema, non tender, no erythema  Neuro: Cranial nerves II through XII are intact, neurovascularly intact in all extremities with 2+ DTRs and 2+ pulses.  Gait normal with good balance and coordination.  Right hand exam shows the patient's right index finger does have triggering noted at the A2 pulley.  Tender to palpation in this area.   Neurovascularly intact distally. Neck exam does have some loss of lordosis.  Significant tightness noted in the parascapular region with patient having traction of the shoulders bilaterally.  Difficulty even to get patient to neutral.  Patient lacks last 10 degrees of extension.  Procedure: Real-time Ultrasound Guided Injection of right index finger tendon flexor sheath Device: GE Logiq Q7 Ultrasound guided injection is preferred based studies that show increased duration, increased effect, greater accuracy, decreased procedural pain, increased response rate, and decreased cost with ultrasound guided versus blind injection.  Verbal informed consent obtained.  Time-out conducted.  Noted no overlying erythema, induration, or other signs of local infection.  Skin prepped in a sterile fashion.  Local anesthesia: Topical Ethyl chloride.  With sterile technique and under real time ultrasound guidance: With a 25-gauge half inch needle injecting 0.5 cc of 0.5% Marcaine and 0.5 cc of Kenalog 40 mg/mL. Completed without difficulty  Pain immediately improved suggesting accurate placement of the medication.  Advised to call if fevers/chills, erythema, induration, drainage, or persistent bleeding.  Impression: Technically successful ultrasound guided injection.  Osteopathic findings  C6 flexed rotated and side bent left T3 extended rotated and side bent right inhaled rib T9 extended rotated and side bent left L2 flexed rotated and side bent right Sacrum right on right       Assessment and Plan:    Nonallopathic problems  Decision today to treat with OMT was based on Physical Exam  After verbal consent patient was treated with HVLA, ME, FPR techniques in cervical, rib, thoracic, lumbar, and sacral  areas  Patient tolerated the procedure well with improvement in symptoms  Patient given exercises, stretches and lifestyle modifications  See medications in patient instructions if  given  Patient will follow up in 4-8 weeks      The above documentation has been reviewed and is accurate and complete Lyndal Pulley, DO       Note: This dictation was prepared with Dragon dictation along with smaller phrase technology. Any transcriptional errors that result from this process are unintentional.

## 2021-01-19 ENCOUNTER — Ambulatory Visit: Payer: Self-pay

## 2021-01-19 ENCOUNTER — Ambulatory Visit: Payer: 59 | Admitting: Family Medicine

## 2021-01-19 ENCOUNTER — Other Ambulatory Visit: Payer: Self-pay

## 2021-01-19 VITALS — BP 122/82 | HR 76 | Ht 62.0 in | Wt 151.0 lb

## 2021-01-19 DIAGNOSIS — M9903 Segmental and somatic dysfunction of lumbar region: Secondary | ICD-10-CM

## 2021-01-19 DIAGNOSIS — M9908 Segmental and somatic dysfunction of rib cage: Secondary | ICD-10-CM | POA: Diagnosis not present

## 2021-01-19 DIAGNOSIS — M79644 Pain in right finger(s): Secondary | ICD-10-CM

## 2021-01-19 DIAGNOSIS — M9902 Segmental and somatic dysfunction of thoracic region: Secondary | ICD-10-CM

## 2021-01-19 DIAGNOSIS — M9904 Segmental and somatic dysfunction of sacral region: Secondary | ICD-10-CM | POA: Diagnosis not present

## 2021-01-19 DIAGNOSIS — M5010 Cervical disc disorder with radiculopathy, unspecified cervical region: Secondary | ICD-10-CM

## 2021-01-19 DIAGNOSIS — M65321 Trigger finger, right index finger: Secondary | ICD-10-CM

## 2021-01-19 DIAGNOSIS — M9901 Segmental and somatic dysfunction of cervical region: Secondary | ICD-10-CM | POA: Diagnosis not present

## 2021-01-19 NOTE — Assessment & Plan Note (Signed)
Reviewed injection given today.  Tolerated the procedure well.  Discussed icing regimen and home exercises.  Discussed potentially bracing at night.  Patient continues to have difficulty and may need to consider the possibility of surgical intervention if this continues.  Patient will follow up with me again in 6 to 8 weeks for other things.

## 2021-01-19 NOTE — Assessment & Plan Note (Signed)
Chronic problem with mild exacerbation.  I do think that the patient's job and positioning can potentially actually cause more discomfort and pain.  Discussed icing regimen and home exercises.  Discussed avoiding certain activities.  Patient is responding relatively well though to osteopathic manipulation.  Follow-up again 6 to 8 weeks.

## 2021-01-19 NOTE — Patient Instructions (Addendum)
Injected trigger finger See me again in 8-10 weeks

## 2021-01-25 ENCOUNTER — Other Ambulatory Visit (HOSPITAL_COMMUNITY): Payer: Self-pay

## 2021-01-25 MED FILL — Ondansetron Orally Disintegrating Tab 4 MG: ORAL | 10 days supply | Qty: 30 | Fill #1 | Status: AC

## 2021-02-02 ENCOUNTER — Other Ambulatory Visit: Payer: Self-pay | Admitting: Obstetrics & Gynecology

## 2021-02-02 DIAGNOSIS — Z1231 Encounter for screening mammogram for malignant neoplasm of breast: Secondary | ICD-10-CM

## 2021-02-17 ENCOUNTER — Encounter (HOSPITAL_BASED_OUTPATIENT_CLINIC_OR_DEPARTMENT_OTHER): Payer: Self-pay

## 2021-02-17 ENCOUNTER — Other Ambulatory Visit: Payer: Self-pay

## 2021-02-17 ENCOUNTER — Emergency Department (HOSPITAL_BASED_OUTPATIENT_CLINIC_OR_DEPARTMENT_OTHER): Payer: 59

## 2021-02-17 ENCOUNTER — Inpatient Hospital Stay (HOSPITAL_BASED_OUTPATIENT_CLINIC_OR_DEPARTMENT_OTHER)
Admission: EM | Admit: 2021-02-17 | Discharge: 2021-02-20 | DRG: 382 | Disposition: A | Discharge status: Home / Self Care | Payer: 59 | Attending: Internal Medicine | Admitting: Internal Medicine

## 2021-02-17 DIAGNOSIS — F419 Anxiety disorder, unspecified: Secondary | ICD-10-CM | POA: Diagnosis not present

## 2021-02-17 DIAGNOSIS — F32A Depression, unspecified: Secondary | ICD-10-CM | POA: Diagnosis not present

## 2021-02-17 DIAGNOSIS — K219 Gastro-esophageal reflux disease without esophagitis: Secondary | ICD-10-CM | POA: Diagnosis present

## 2021-02-17 DIAGNOSIS — G4733 Obstructive sleep apnea (adult) (pediatric): Secondary | ICD-10-CM | POA: Diagnosis not present

## 2021-02-17 DIAGNOSIS — I2510 Atherosclerotic heart disease of native coronary artery without angina pectoris: Secondary | ICD-10-CM | POA: Diagnosis present

## 2021-02-17 DIAGNOSIS — Z79899 Other long term (current) drug therapy: Secondary | ICD-10-CM | POA: Diagnosis not present

## 2021-02-17 DIAGNOSIS — R109 Unspecified abdominal pain: Secondary | ICD-10-CM | POA: Diagnosis present

## 2021-02-17 DIAGNOSIS — E785 Hyperlipidemia, unspecified: Secondary | ICD-10-CM | POA: Diagnosis not present

## 2021-02-17 DIAGNOSIS — Z8542 Personal history of malignant neoplasm of other parts of uterus: Secondary | ICD-10-CM

## 2021-02-17 DIAGNOSIS — K8020 Calculus of gallbladder without cholecystitis without obstruction: Secondary | ICD-10-CM | POA: Diagnosis not present

## 2021-02-17 DIAGNOSIS — Z886 Allergy status to analgesic agent status: Secondary | ICD-10-CM

## 2021-02-17 DIAGNOSIS — Z9884 Bariatric surgery status: Secondary | ICD-10-CM

## 2021-02-17 DIAGNOSIS — Z90710 Acquired absence of both cervix and uterus: Secondary | ICD-10-CM

## 2021-02-17 DIAGNOSIS — Z888 Allergy status to other drugs, medicaments and biological substances status: Secondary | ICD-10-CM | POA: Diagnosis not present

## 2021-02-17 DIAGNOSIS — R1110 Vomiting, unspecified: Secondary | ICD-10-CM | POA: Diagnosis not present

## 2021-02-17 DIAGNOSIS — N200 Calculus of kidney: Secondary | ICD-10-CM | POA: Diagnosis present

## 2021-02-17 DIAGNOSIS — K289 Gastrojejunal ulcer, unspecified as acute or chronic, without hemorrhage or perforation: Secondary | ICD-10-CM | POA: Diagnosis not present

## 2021-02-17 DIAGNOSIS — E7800 Pure hypercholesterolemia, unspecified: Secondary | ICD-10-CM | POA: Diagnosis not present

## 2021-02-17 DIAGNOSIS — R1011 Right upper quadrant pain: Secondary | ICD-10-CM

## 2021-02-17 DIAGNOSIS — I10 Essential (primary) hypertension: Secondary | ICD-10-CM | POA: Diagnosis not present

## 2021-02-17 DIAGNOSIS — K529 Noninfective gastroenteritis and colitis, unspecified: Secondary | ICD-10-CM | POA: Diagnosis not present

## 2021-02-17 DIAGNOSIS — E119 Type 2 diabetes mellitus without complications: Secondary | ICD-10-CM | POA: Diagnosis not present

## 2021-02-17 DIAGNOSIS — Z86010 Personal history of colonic polyps: Secondary | ICD-10-CM

## 2021-02-17 DIAGNOSIS — Z28310 Unvaccinated for covid-19: Secondary | ICD-10-CM

## 2021-02-17 DIAGNOSIS — E1165 Type 2 diabetes mellitus with hyperglycemia: Secondary | ICD-10-CM | POA: Diagnosis present

## 2021-02-17 DIAGNOSIS — Z20822 Contact with and (suspected) exposure to covid-19: Secondary | ICD-10-CM | POA: Diagnosis present

## 2021-02-17 DIAGNOSIS — Z9989 Dependence on other enabling machines and devices: Secondary | ICD-10-CM | POA: Diagnosis not present

## 2021-02-17 DIAGNOSIS — R1084 Generalized abdominal pain: Secondary | ICD-10-CM | POA: Diagnosis not present

## 2021-02-17 DIAGNOSIS — G4730 Sleep apnea, unspecified: Secondary | ICD-10-CM | POA: Diagnosis not present

## 2021-02-17 DIAGNOSIS — Z8719 Personal history of other diseases of the digestive system: Secondary | ICD-10-CM | POA: Diagnosis not present

## 2021-02-17 HISTORY — DX: Bariatric surgery status: Z98.84

## 2021-02-17 LAB — CBC WITH DIFFERENTIAL/PLATELET
Abs Immature Granulocytes: 0.08 10*3/uL — ABNORMAL HIGH (ref 0.00–0.07)
Basophils Absolute: 0.1 10*3/uL (ref 0.0–0.1)
Basophils Relative: 0 %
Eosinophils Absolute: 0.1 10*3/uL (ref 0.0–0.5)
Eosinophils Relative: 1 %
HCT: 43.8 % (ref 36.0–46.0)
Hemoglobin: 14.6 g/dL (ref 12.0–15.0)
Immature Granulocytes: 1 %
Lymphocytes Relative: 21 %
Lymphs Abs: 3 10*3/uL (ref 0.7–4.0)
MCH: 31.2 pg (ref 26.0–34.0)
MCHC: 33.3 g/dL (ref 30.0–36.0)
MCV: 93.6 fL (ref 80.0–100.0)
Monocytes Absolute: 0.7 10*3/uL (ref 0.1–1.0)
Monocytes Relative: 5 %
Neutro Abs: 10.6 10*3/uL — ABNORMAL HIGH (ref 1.7–7.7)
Neutrophils Relative %: 72 %
Platelets: 270 10*3/uL (ref 150–400)
RBC: 4.68 MIL/uL (ref 3.87–5.11)
RDW: 12.6 % (ref 11.5–15.5)
WBC: 14.6 10*3/uL — ABNORMAL HIGH (ref 4.0–10.5)
nRBC: 0 % (ref 0.0–0.2)

## 2021-02-17 LAB — COMPREHENSIVE METABOLIC PANEL
ALT: 17 U/L (ref 0–44)
AST: 16 U/L (ref 15–41)
Albumin: 4.9 g/dL (ref 3.5–5.0)
Alkaline Phosphatase: 49 U/L (ref 38–126)
Anion gap: 14 (ref 5–15)
BUN: 24 mg/dL — ABNORMAL HIGH (ref 8–23)
CO2: 25 mmol/L (ref 22–32)
Calcium: 10 mg/dL (ref 8.9–10.3)
Chloride: 102 mmol/L (ref 98–111)
Creatinine, Ser: 0.65 mg/dL (ref 0.44–1.00)
GFR, Estimated: >60 mL/min (ref >60)
Glucose, Bld: 174 mg/dL — ABNORMAL HIGH (ref 70–99)
Potassium: 3.9 mmol/L (ref 3.5–5.1)
Sodium: 141 mmol/L (ref 135–145)
Total Bilirubin: 0.3 mg/dL (ref 0.3–1.2)
Total Protein: 7.3 g/dL (ref 6.5–8.1)

## 2021-02-17 LAB — LIPASE, BLOOD: Lipase: 46 U/L (ref 11–51)

## 2021-02-17 MED ORDER — LACTATED RINGERS IV BOLUS
1000.0000 mL | Freq: Once | INTRAVENOUS | Status: AC
  Administered 2021-02-17: 1000 mL via INTRAVENOUS

## 2021-02-17 MED ORDER — SODIUM CHLORIDE 0.9 % IV SOLN
1.0000 g | Freq: Once | INTRAVENOUS | Status: AC
  Administered 2021-02-18: 1 g via INTRAVENOUS
  Filled 2021-02-17: qty 10

## 2021-02-17 MED ORDER — ONDANSETRON HCL 4 MG/2ML IJ SOLN
4.0000 mg | Freq: Once | INTRAMUSCULAR | Status: AC
  Administered 2021-02-17: 4 mg via INTRAVENOUS
  Filled 2021-02-17: qty 2

## 2021-02-17 MED ORDER — HYDROMORPHONE HCL 1 MG/ML IJ SOLN
1.0000 mg | Freq: Once | INTRAMUSCULAR | Status: AC
  Administered 2021-02-17: 1 mg via INTRAVENOUS
  Filled 2021-02-17: qty 1

## 2021-02-17 MED ORDER — IOHEXOL 300 MG/ML  SOLN
100.0000 mL | Freq: Once | INTRAMUSCULAR | Status: AC | PRN
  Administered 2021-02-17: 100 mL via INTRAVENOUS

## 2021-02-17 NOTE — ED Triage Notes (Addendum)
Pt is present for upper abd pain that started at 1600. Pain became excruciating at appx 1700. Pt thought it was "trapped air" and took something for constipation. Pt has had one episode of emesis and one BM. Pt its unable to sit still and moving around screaming. Hx of bleeding of ulcers and gastric bypass.

## 2021-02-17 NOTE — ED Provider Notes (Signed)
[facility] Provider Note  CSN: [id number] Arrival date & time: 02/17/21 1953    History Chief Complaint  Patient presents with  . Abdominal Pain  . Nausea    Sandra [name] is a 60 y.o. female with prior history of gastric bypass surgery in 2015 followed by bleeding ulcers a few years later has been doing well more recnetly. She reports she began having a gnawing hunger like pain in her upper abdomen around 4pm that progressed to severe aching throbbing pain, does not radiate into her back. Associated with vomiting but no hematemesis.    Past Medical History:  Diagnosis Date  . Anxiety   . Arthritis   . CAD (coronary artery disease)    nonobstructive CAD in LAD per 2013 CTA coronary morph  . Cancer [facility]) 2006   uterine  . Complication of anesthesia PT STATES HAS LETTER FROM [place] [facility]. STATING THAT SHE HAS A VERY SMALL AIRWAY (LAST SURG. HYSTERECTOMY AT [facility] IN 2006   PT STATES LYNN BEASON CRNA WHOM WORKS HERE TOLD SHE WOULD BE OKAY TO BE DONE AT [facility]  . Depression   . Diabetes mellitus    no meds taken now, checks cbg once or twice a week   . Difficult intubation    had to use the glidescope due to small airway   . 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 GERD (gastroesophageal reflux disease)   . Heart murmur    with last visit, MD states "outgrown" murmur  . Hemorrhoids, internal, with bleeding and Grade 2 prolapse 03/05/2013   All positions seen an anoscopy RP columns banded 03/05/2013 LL and RA banded 03/18/2013     . History of endometrial cancer 2006 ---- S/P ABD. HYSTERECTOMY   STAGE I ENDOMETROID CARCINOMA  . Hx of adenomatous colonic polyps 2011   BENIGN  . Hyperlipidemia    no meds now  . Hypertension   . Morbid obesity, weight - 203, BMI - 38.4 05/12/2013  . OSA on CPAP    cpap setting of 16  . Sleep apnea    no CPAP since surgery    Past Surgical History:  Procedure Laterality Date  . ABDOMINAL  HYSTERECTOMY  12-20-2004   TAH  . BILATERAL BREAST REDUCTION  11-16-1999  . BREATH TEK H PYLORI N/A 05/27/2013   Procedure: BREATH TEK H PYLORI;  Surgeon: Shann Medal, MD;  Location: [facility];  Service: General;  Laterality: N/A;  . [place]  . COLONOSCOPY    . COLONOSCOPY N/A 04/25/2014   Procedure: COLONOSCOPY;  Surgeon: Gatha Mayer, MD;  Location: [facility];  Service: Endoscopy;  Laterality: N/A;  . COLONOSCOPY WITH PROPOFOL N/A 07/26/2019   Procedure: COLONOSCOPY WITH PROPOFOL;  Surgeon: Gatha Mayer, MD;  Location: [facility];  Service: Endoscopy;  Laterality: N/A;  . CYSTOSCOPY  09/06/2011   Procedure: CYSTOSCOPY;  Surgeon: Ailene Rud, MD;  Location: [facility];  Service: Urology;  Laterality: N/A;  LYNX SLING   . ESOPHAGOGASTRODUODENOSCOPY (EGD) WITH PROPOFOL N/A 09/27/2014   Procedure: ESOPHAGOGASTRODUODENOSCOPY (EGD) WITH PROPOFOL;  Surgeon: Gatha Mayer, MD;  Location: [facility];  Service: Endoscopy;  Laterality: N/A;  . ESOPHAGOGASTRODUODENOSCOPY (EGD) WITH PROPOFOL N/A 03/11/2016   Procedure: ESOPHAGOGASTRODUODENOSCOPY (EGD) WITH PROPOFOL;  Surgeon: Jerene Bears, MD;  Location: [facility];  Service: Endoscopy;  Laterality: N/A;  . GASTRIC BYPASS  10/04/2013  . GASTRIC ROUX-EN-Y N/A 10/04/2013   Procedure: LAPAROSCOPIC ROUX-EN-Y  GASTRIC BYPASS WITH UPPER ENDOSCOPY;  Surgeon: Shann Medal, MD;  Location: [facility];  Service: General;  Laterality: N/A;  . HEMORRHOID BANDING  02/2013  . POLYPECTOMY    . POLYPECTOMY  07/26/2019   Procedure: POLYPECTOMY;  Surgeon: Gatha Mayer, MD;  Location: [facility];  Service: Endoscopy;;  . PUBOVAGINAL SLING  09/06/2011   Procedure: Gaynelle Arabian;  Surgeon: Ailene Rud, MD;  Location: [facility];  Service: Urology;  Laterality: N/A;  . REDUCTION MAMMAPLASTY Bilateral 2001  . SHOULDER ARTHROSCOPY Right 10/23/2017   Procedure: RIGHT SHOULDER ARTHROSCOPIC  ROTATOR CUFF REPAIR VS. DEBRIDEMENT, SUBACROMIAL DECOMPARESSION, DISTAL CLAVICAL EXCISION;  Surgeon: Tania Ade, MD;  Location: [facility];  Service: Orthopedics;  Laterality: Right;  . UVULOPALATOPHARYNGOPLASTY, TONSILLECTOMY AND SEPTOPLASTY  1994    Family History  Adopted: Yes    Social History   Tobacco Use  . Smoking status: Never  . Smokeless tobacco: Never  Vaping Use  . Vaping Use: Never used  Substance Use Topics  . Alcohol use: No    Alcohol/week: 0.0 standard drinks  . Drug use: No     Home Medications Prior to Admission medications   Medication Sig Start Date End Date Taking? Authorizing Provider  albuterol (VENTOLIN HFA) 108 (90 Base) MCG/ACT inhaler Inhale 1-2 puffs into the lungs every 4 (four) hours as needed for wheezing or shortness of breath. 01/10/20   Margarita Mail, PA-C  augmented betamethasone dipropionate (DIPROLENE-AF) 0.05 % cream Apply topically 2 (two) times daily. 07/07/20   Gatha Mayer, MD  busPIRone (BUSPAR) 7.5 MG tablet Take 1 tablet (7.5 mg total) by mouth 3 (three) times daily. 10/21/19   [name], MD  Cyanocobalamin (B-12) 1000 MCG SUBL Place 1,000 mcg under the tongue daily.    [provider]  esomeprazole (NEXIUM) 40 MG capsule TAKE 1 CAPSULE BY MOUTH 2 TIMES DAILY BEFORE A MEAL 03/29/20 03/29/21  Gatha Mayer, MD  gabapentin (NEURONTIN) 300 MG capsule TAKE 1 CAPSULE BY MOUTH 3 TIMES DAILY. 06/19/20 06/19/21  Lyndal Pulley, DO  glucose blood (FREESTYLE LITE) test strip Use daily as instructed. 01/05/21   [name], MD  glucose blood test strip Use to check blood sugars daily. 09/10/18   [name], MD  linaclotide [facility]) 145 MCG CAPS capsule TAKE 1 CAPSULE BY MOUTH ONCE DAILY BEFORE BREAKFAST 04/13/20 04/13/21  Gatha Mayer, MD  Multiple Vitamins-Minerals (MULTIVITAMIN PO) Take 2 tablets by mouth daily.     [provider]  ondansetron (ZOFRAN-ODT) 4 MG disintegrating tablet DISSOLVE 1 TABLET BY MOUTH  EVERY 8 HOURS AS NEEDED FOR NAUSEA OR VOMITING (MAY TAKE 2 TABLETS IF 1 TABLET IS INEFFECTIVE) 06/26/20 06/26/21  Gatha Mayer, MD  sucralfate (CARAFATE) 1 g tablet Take 1 tablet (1 g total) by mouth daily as needed (acid). 07/26/19   Gatha Mayer, MD     Allergies    Nsaids and Statins   Review of Systems   Review of Systems A comprehensive review of systems was completed and negative except as noted in HPI.    Physical Exam BP (!) 167/107 (BP Location: Left Arm)   Pulse 72   Temp 99.1 F (37.3 C) (Oral)   Resp 18   Ht 5\' 2"  (1.575 m)   Wt 66.7 kg   SpO2 100%   BMI 26.89 kg/m   Physical Exam Vitals and nursing note reviewed.  Constitutional:      General: She is in  acute distress.     Appearance: Normal appearance.  HENT:     Head: Normocephalic and atraumatic.     Nose: Nose normal.     Mouth/Throat:     Mouth: Mucous membranes are moist.  Eyes:     Extraocular Movements: Extraocular movements intact.     Conjunctiva/sclera: Conjunctivae normal.  Cardiovascular:     Rate and Rhythm: Normal rate.  Pulmonary:     Effort: Pulmonary effort is normal.     Breath sounds: Normal breath sounds.  Abdominal:     General: Abdomen is flat.     Palpations: Abdomen is soft.     Tenderness: There is abdominal tenderness in the right upper quadrant, epigastric area and left upper quadrant. There is guarding. Positive signs include Murphy's sign. Negative signs include McBurney's sign.  Musculoskeletal:        General: No swelling. Normal range of motion.     Cervical back: Neck supple.  Skin:    General: Skin is warm and dry.  Neurological:     General: No focal deficit present.     Mental Status: She is alert.  Psychiatric:        Mood and Affect: Mood normal.     ED Results / Procedures / Treatments   Labs (all labs ordered are listed, but only abnormal results are displayed) Labs Reviewed  CBC WITH DIFFERENTIAL/PLATELET - Abnormal; Notable for the following  components:      Result Value   WBC 14.6 (*)    Neutro Abs 10.6 (*)    Abs Immature Granulocytes 0.08 (*)    All other components within normal limits  COMPREHENSIVE METABOLIC PANEL  LIPASE, BLOOD    EKG None   Radiology No results found.  Procedures Procedures  Medications Ordered in the ED Medications  ondansetron (ZOFRAN) injection 4 mg (has no administration in time range)  lactated ringers bolus 1,000 mL (has no administration in time range)  HYDROmorphone (DILAUDID) injection 1 mg (1 mg Intravenous Given 02/17/21 2106)     MDM Rules/Calculators/A&P MDM Patient with severe epigastric pain, consider biliary colic, perforated ulcer, gastritis. Will check labs and send for CT. Pain/nausea meds for comfort.   ED Course  I have reviewed the triage vital signs and the nursing notes.  Pertinent labs & imaging results that were available during my care of the patient were reviewed by me and considered in my medical decision making (see chart for details).  Clinical Course as of 02/18/21 1456  Sat Feb 17, 2021  2121 CBC with leukocytosis.  [CS]  2149 CMP and lipase are unremarkable.  [CS]  2216 CT images and results reviewed.  Will check [place] to further eval gall bladder [CS]  2317 US shows gall stones without signs of cholecystitis however pain has worsened again. Will give a third dose of pain meds and discuss with surgery.  [CS]  2331 Discussed with Dr. Bobbye Morton, on call for General Surgery who requests the patient be admitted to Hospitalists for pain control, consideration of HIDA and they will consult.  [CS]  2332 Care of the patient signed out to Dr. Randal Buba pending Hospitalist call back. Rocephin given for suspected early cholecystitis/cholangitis given elevated WBC.  [CS]    Clinical Course User Index [CS] Truddie Hidden, MD    Final Clinical Impression(s) / ED Diagnoses Final diagnoses:  None    Rx / DC Orders ED Discharge Orders     None  Truddie Hidden, MD 02/18/21 [phone]

## 2021-02-18 ENCOUNTER — Encounter (HOSPITAL_COMMUNITY): Payer: Self-pay | Admitting: Internal Medicine

## 2021-02-18 DIAGNOSIS — Z28310 Unvaccinated for covid-19: Secondary | ICD-10-CM | POA: Diagnosis not present

## 2021-02-18 DIAGNOSIS — Z20822 Contact with and (suspected) exposure to covid-19: Secondary | ICD-10-CM | POA: Diagnosis not present

## 2021-02-18 DIAGNOSIS — Z886 Allergy status to analgesic agent status: Secondary | ICD-10-CM | POA: Diagnosis not present

## 2021-02-18 DIAGNOSIS — Z8542 Personal history of malignant neoplasm of other parts of uterus: Secondary | ICD-10-CM | POA: Diagnosis not present

## 2021-02-18 DIAGNOSIS — Z90710 Acquired absence of both cervix and uterus: Secondary | ICD-10-CM | POA: Diagnosis not present

## 2021-02-18 DIAGNOSIS — K219 Gastro-esophageal reflux disease without esophagitis: Secondary | ICD-10-CM | POA: Diagnosis not present

## 2021-02-18 DIAGNOSIS — Z79899 Other long term (current) drug therapy: Secondary | ICD-10-CM | POA: Diagnosis not present

## 2021-02-18 DIAGNOSIS — Z9884 Bariatric surgery status: Secondary | ICD-10-CM

## 2021-02-18 DIAGNOSIS — E119 Type 2 diabetes mellitus without complications: Secondary | ICD-10-CM | POA: Diagnosis not present

## 2021-02-18 DIAGNOSIS — F419 Anxiety disorder, unspecified: Secondary | ICD-10-CM | POA: Diagnosis not present

## 2021-02-18 DIAGNOSIS — N200 Calculus of kidney: Secondary | ICD-10-CM | POA: Diagnosis not present

## 2021-02-18 DIAGNOSIS — R1011 Right upper quadrant pain: Secondary | ICD-10-CM

## 2021-02-18 DIAGNOSIS — Z86010 Personal history of colonic polyps: Secondary | ICD-10-CM | POA: Diagnosis not present

## 2021-02-18 DIAGNOSIS — K289 Gastrojejunal ulcer, unspecified as acute or chronic, without hemorrhage or perforation: Secondary | ICD-10-CM | POA: Diagnosis not present

## 2021-02-18 DIAGNOSIS — G4733 Obstructive sleep apnea (adult) (pediatric): Secondary | ICD-10-CM | POA: Diagnosis not present

## 2021-02-18 DIAGNOSIS — I2510 Atherosclerotic heart disease of native coronary artery without angina pectoris: Secondary | ICD-10-CM | POA: Diagnosis not present

## 2021-02-18 DIAGNOSIS — E1165 Type 2 diabetes mellitus with hyperglycemia: Secondary | ICD-10-CM | POA: Diagnosis not present

## 2021-02-18 DIAGNOSIS — F32A Depression, unspecified: Secondary | ICD-10-CM | POA: Diagnosis not present

## 2021-02-18 DIAGNOSIS — R1084 Generalized abdominal pain: Secondary | ICD-10-CM | POA: Diagnosis not present

## 2021-02-18 DIAGNOSIS — E785 Hyperlipidemia, unspecified: Secondary | ICD-10-CM | POA: Diagnosis not present

## 2021-02-18 DIAGNOSIS — Z888 Allergy status to other drugs, medicaments and biological substances status: Secondary | ICD-10-CM | POA: Diagnosis not present

## 2021-02-18 DIAGNOSIS — I10 Essential (primary) hypertension: Secondary | ICD-10-CM | POA: Diagnosis not present

## 2021-02-18 LAB — RESP PANEL BY RT-PCR (FLU A&B, COVID) ARPGX2
Influenza A by PCR: NEGATIVE
Influenza B by PCR: NEGATIVE
SARS Coronavirus 2 by RT PCR: NEGATIVE

## 2021-02-18 LAB — HIV ANTIBODY (ROUTINE TESTING W REFLEX): HIV Screen 4th Generation wRfx: NONREACTIVE

## 2021-02-18 MED ORDER — ONDANSETRON 4 MG PO TBDP: 4.0000 mg | ORAL_TABLET | Freq: Four times a day (QID) | ORAL | Status: DC | PRN

## 2021-02-18 MED ORDER — ALBUTEROL SULFATE (2.5 MG/3ML) 0.083% IN NEBU: 2.5000 mg | INHALATION_SOLUTION | RESPIRATORY_TRACT | Status: DC | PRN

## 2021-02-18 MED ORDER — ACETAMINOPHEN 650 MG RE SUPP: 650.0000 mg | Freq: Four times a day (QID) | RECTAL | Status: DC | PRN

## 2021-02-18 MED ORDER — LACTATED RINGERS IV SOLN: INTRAVENOUS | Status: DC

## 2021-02-18 MED ORDER — HYDROMORPHONE HCL 1 MG/ML IJ SOLN
1.0000 mg | Freq: Once | INTRAMUSCULAR | Status: AC
  Administered 2021-02-18: 1 mg via INTRAVENOUS
  Filled 2021-02-18: qty 1

## 2021-02-18 MED ORDER — MULTIVITAMIN PO TABS: ORAL_TABLET | Freq: Every day | ORAL | Status: DC

## 2021-02-18 MED ORDER — HYDRALAZINE HCL 20 MG/ML IJ SOLN: 10.0000 mg | INTRAMUSCULAR | Status: DC | PRN

## 2021-02-18 MED ORDER — ACETAMINOPHEN 325 MG PO TABS
650.0000 mg | ORAL_TABLET | Freq: Four times a day (QID) | ORAL | Status: DC | PRN
  Administered 2021-02-18 – 2021-02-19 (×3): 650 mg via ORAL
  Filled 2021-02-18 (×5): qty 2

## 2021-02-18 MED ORDER — GABAPENTIN 300 MG PO CAPS
300.0000 mg | ORAL_CAPSULE | Freq: Every day | ORAL | Status: DC
  Administered 2021-02-18 – 2021-02-19 (×2): 300 mg via ORAL
  Filled 2021-02-18 (×2): qty 1

## 2021-02-18 MED ORDER — ONDANSETRON HCL 4 MG/2ML IJ SOLN: 4.0000 mg | Freq: Four times a day (QID) | INTRAMUSCULAR | Status: DC | PRN

## 2021-02-18 MED ORDER — ALBUTEROL SULFATE HFA 108 (90 BASE) MCG/ACT IN AERS: 1.0000 | INHALATION_SPRAY | RESPIRATORY_TRACT | Status: DC | PRN

## 2021-02-18 MED ORDER — ONDANSETRON HCL 4 MG/2ML IJ SOLN
4.0000 mg | Freq: Once | INTRAMUSCULAR | Status: AC
  Administered 2021-02-18: 4 mg via INTRAVENOUS
  Filled 2021-02-18: qty 2

## 2021-02-18 MED ORDER — MORPHINE SULFATE (PF) 2 MG/ML IV SOLN
2.0000 mg | INTRAVENOUS | Status: DC | PRN
  Administered 2021-02-18 – 2021-02-19 (×4): 2 mg via INTRAVENOUS
  Filled 2021-02-18 (×5): qty 1

## 2021-02-18 MED ORDER — OXYCODONE HCL 5 MG PO TABS
5.0000 mg | ORAL_TABLET | ORAL | Status: DC | PRN
  Administered 2021-02-18 – 2021-02-20 (×4): 10 mg via ORAL
  Filled 2021-02-18 (×4): qty 2

## 2021-02-18 MED ORDER — DICYCLOMINE HCL 10 MG/ML IM SOLN
20.0000 mg | Freq: Once | INTRAMUSCULAR | Status: AC
  Administered 2021-02-18: 20 mg via INTRAMUSCULAR
  Filled 2021-02-18: qty 2

## 2021-02-18 MED ORDER — ADULT MULTIVITAMIN W/MINERALS CH
1.0000 | ORAL_TABLET | Freq: Every day | ORAL | Status: DC
  Administered 2021-02-20: 1 via ORAL
  Filled 2021-02-18: qty 1

## 2021-02-18 NOTE — Progress Notes (Signed)
Patient has arrived to [facility] from [facility]. Patient is alert and oriented x 4. Will notify Admissions to page on call MD for orders.  Patient will need pain meds.  Time [phone] On call will put in order for Dilaudid iv and Zofran until day on call MD rounds this morning.

## 2021-02-18 NOTE — H&P (Signed)
History and Physical    Sandra [name] WEX:[id number] DOB: [birth year] DOA: 02/17/2021  PCP: [name], MD Consultants:  Tamala Julian - sports medicine; Abran Duke - GI Patient coming from:  Home - lives with granddaughter; NOK: Joan Mayans, [phone]  Chief Complaint: Abdominal pain  HPI: Sandra [name] is a 60 y.o. female with medical history significant of CAD; endometrial CA s/p TAH (2006); DM;  gastrojejunal marginal ulcer (2016); HTN; HLD; OSA no longer on CPAP; and s/p gastric bypass presenting with abdominal pain. Thursday and Friday, she thought she had a stomach bug after her granddaughter had it earlier in the week.  She had loose BMs.  Yesterday, the pain was harder and terrible.  Pain meds overnight only took the edge off. Loose BMs have resolved, last was yesterday afternoon.  +nausea, no emesis other than once yesterday.  Pain starts in R flank and radiates across her RUQ primarily.  No fever.  Pain was worse than childbirth.      ED Course: [facility] to [facility], per Dr. Myna Hidalgo:  abdominal pain, possible acute cholecystitis. She is a 60 yr old w/ T2DM, PUD, and gastric bypass who p/w severe upper abd pain and N/V x1. WBC is 14,600. CT with sludge in slightly distended gall bladder and slightly indistinct pericholecystic fat but no evidence for acute cholecystitis on RUQ [place]. ED spoke with surgery at [facility] (Dr. Bobbye Morton) who will consult.    Review of Systems: As per HPI; otherwise review of systems reviewed and negative.   Ambulatory Status:  Ambulates without assistance  COVID Vaccine Status:  None  Past Medical History:  Diagnosis Date   Anxiety    Arthritis    CAD (coronary artery disease)    nonobstructive CAD in LAD per 2013 CTA coronary morph   Depression    Diabetes mellitus    no meds taken now, checks cbg once or twice a week    Difficult intubation    had to use the glidescope due to small airway    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    marginal ulcer   GERD (gastroesophageal reflux disease)    H/O gastric bypass    Heart murmur    with last visit, MD states "outgrown" murmur   Hemorrhoids, internal, with bleeding and Grade 2 prolapse 03/05/2013   All positions seen an anoscopy RP columns banded 03/05/2013 LL and RA banded 03/18/2013      History of endometrial cancer 2006 ---- S/P ABD. HYSTERECTOMY   STAGE I ENDOMETROID CARCINOMA   Hx of adenomatous colonic polyps 2011   BENIGN   Hyperlipidemia    no meds now   Hypertension    Sleep apnea    no CPAP since surgery    Past Surgical History:  Procedure Laterality Date   ABDOMINAL HYSTERECTOMY  12-20-2004   TAH   BILATERAL BREAST REDUCTION  11-16-1999   BREATH TEK H PYLORI N/A 05/27/2013   Procedure: [place];  Surgeon: Shann Medal, MD;  Location: [facility];  Service: General;  Laterality: N/A;   [place] N/A 04/25/2014   Procedure: COLONOSCOPY;  Surgeon: Gatha Mayer, MD;  Location: [facility];  Service: Endoscopy;  Laterality: N/A;   COLONOSCOPY WITH PROPOFOL N/A 07/26/2019   Procedure: COLONOSCOPY WITH PROPOFOL;  Surgeon: Gatha Mayer, MD;  Location: [facility];  Service: Endoscopy;  Laterality: N/A;   CYSTOSCOPY  09/06/2011   Procedure: CYSTOSCOPY;  Surgeon: Ailene Rud, MD;  Location[facility];  Service: Urology;  Laterality: N/A;  LYNX SLING    ESOPHAGOGASTRODUODENOSCOPY (EGD) WITH PROPOFOL N/A 09/27/2014   Procedure: ESOPHAGOGASTRODUODENOSCOPY (EGD) WITH PROPOFOL;  Surgeon: Gatha Mayer, MD;  Location: [facility];  Service: Endoscopy;  Laterality: N/A;   ESOPHAGOGASTRODUODENOSCOPY (EGD) WITH PROPOFOL N/A 03/11/2016   Procedure: ESOPHAGOGASTRODUODENOSCOPY (EGD) WITH PROPOFOL;  Surgeon: Jerene Bears, MD;  Location: [facility];  Service: Endoscopy;  Laterality: N/A;   GASTRIC BYPASS  10/04/2013   GASTRIC ROUX-EN-Y N/A 10/04/2013   Procedure: LAPAROSCOPIC  ROUX-EN-Y GASTRIC BYPASS WITH UPPER ENDOSCOPY;  Surgeon: Shann Medal, MD;  Location: [facility];  Service: General;  Laterality: N/A;   HEMORRHOID BANDING  02/2013   POLYPECTOMY     POLYPECTOMY  07/26/2019   Procedure: POLYPECTOMY;  Surgeon: Gatha Mayer, MD;  Location: [facility];  Service: Endoscopy;;   PUBOVAGINAL SLING  09/06/2011   Procedure: Gaynelle Arabian;  Surgeon: Ailene Rud, MD;  Location: [facility];  Service: Urology;  Laterality: N/A;   REDUCTION MAMMAPLASTY Bilateral 2001   SHOULDER ARTHROSCOPY Right 10/23/2017   Procedure: RIGHT SHOULDER ARTHROSCOPIC ROTATOR CUFF REPAIR VS. 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[employer]   Social Determinants of Health   Financial Resource Strain: Not on file  Food Insecurity: Not on file  Transportation Needs: Not on file  Physical Activity: Not on file  Stress: Not on file  Social Connections: Not on file  Intimate Partner  Violence: Not on file    Allergies  Allergen Reactions   Nsaids     r/t gastric surgery   Statins     Family History  Adopted: Yes    Prior to Admission medications   Medication Sig Start Date End Date Taking? Authorizing Provider  albuterol (VENTOLIN HFA) 108 (90 Base) MCG/ACT inhaler Inhale 1-2 puffs into the lungs every 4 (four) hours as needed for wheezing or shortness of breath. 01/10/20   Margarita Mail, PA-C  augmented betamethasone dipropionate (DIPROLENE-AF) 0.05 % cream Apply topically 2 (two) times daily. 07/07/20   Gatha Mayer, MD  busPIRone (BUSPAR) 7.5 MG tablet Take 1 tablet (7.5 mg total) by mouth 3 (three) times daily. 10/21/19   [name], MD  Cyanocobalamin (B-12) 1000 MCG SUBL Place 1,000 mcg under the tongue daily.    [provider]  esomeprazole (NEXIUM) 40 MG capsule TAKE  1 CAPSULE BY MOUTH 2 TIMES DAILY BEFORE A MEAL 03/29/20 03/29/21  Gatha Mayer, MD  gabapentin (NEURONTIN) 300 MG capsule TAKE 1 CAPSULE BY MOUTH 3 TIMES DAILY. 06/19/20 06/19/21  Lyndal Pulley, DO  glucose blood (FREESTYLE LITE) test strip Use daily as instructed. 01/05/21   [name], MD  glucose blood test strip Use to check blood sugars daily. 09/10/18   [name], MD  linaclotide [facility]) 145 MCG CAPS capsule TAKE 1 CAPSULE BY MOUTH ONCE DAILY BEFORE BREAKFAST 04/13/20 04/13/21  Gatha Mayer, MD  Multiple Vitamins-Minerals (MULTIVITAMIN PO) Take 2 tablets by mouth daily.     [provider]  ondansetron (ZOFRAN-ODT) 4 MG disintegrating tablet DISSOLVE 1 TABLET BY MOUTH EVERY 8 HOURS AS NEEDED FOR NAUSEA OR VOMITING (MAY TAKE 2 TABLETS IF 1 TABLET IS INEFFECTIVE) 06/26/20 06/26/21  Gatha Mayer, MD  sucralfate (CARAFATE) 1 g tablet Take 1 tablet (1 g total) by mouth daily as needed (acid). 07/26/19   Gatha Mayer, MD    Physical Exam: Vitals:   02/18/21 0415 02/18/21 0500 02/18/21 0622 02/18/21 0828  BP:  (!) 164/89 (!) 175/99 (!) 143/85  Pulse:  86 89 91 (!) 101  Resp:  17 18 17   Temp:   98.7 F (37.1 C) 99.7 F (37.6 C)  TempSrc:   Oral Oral  SpO2: 97% 95% 94% 95%  Weight:      Height:         General:  Appears calm and comfortable and is in NAD Eyes:   EOMI, normal lids, iris ENT:  grossly normal hearing, lips & tongue, mmm Neck:  no LAD, masses or thyromegaly Cardiovascular:  RRR, no m/r/g. No LE edema.  Respiratory:   CTA bilaterally with no wheezes/rales/rhonchi.  Normal respiratory effort. Abdomen:  diffuse TTP with guarding, most pronounced in RUQ, hypoactive BS Skin:  no rash or induration seen on limited exam Musculoskeletal:  grossly normal tone BUE/BLE, good ROM, no bony abnormality Psychiatric:  grossly normal mood and affect, speech fluent and appropriate, AOx3 Neurologic:  CN 2-12 grossly intact, moves all extremities in coordinated fashion    Radiological Exams on Admission: Independently reviewed - see discussion in A/P where applicable  CT Abdomen Pelvis W Contrast  Result Date: 02/17/2021 CLINICAL DATA:  Bowel obstruction suspected Abdominal pain.  Episode of vomiting. EXAM: CT ABDOMEN AND PELVIS WITH CONTRAST TECHNIQUE: Multidetector CT imaging of the abdomen and pelvis was performed using the standard protocol following bolus administration of intravenous contrast. CONTRAST:  139mL OMNIPAQUE IOHEXOL 300 MG/ML  SOLN COMPARISON:  Noncontrast CT 08/19/2014 FINDINGS: Lower chest: No acute airspace disease or pleural effusion. Heart size normal. Hepatobiliary: No focal hepatic lesion. Layering stones or sludge in the gallbladder which is mildly distended. There is slight indistinctness of the pericholecystic fat. No visualized choledocholithiasis. Upper normal common bile duct measuring 7 mm at the porta hepatis. Pancreas: No ductal dilatation or inflammation. Single calcification in the pancreatic body. Spleen: Normal in size. Lobulated hypodensity in the periphery of the inferior spleen, likely a hemangioma.  Adrenals/Urinary Tract: Slight adrenal thickening without dominant nodule. Small nonobstructing stone in the lower right kidney. Punctate nonobstructing stone in the mid lower left kidney. No hydronephrosis. Homogeneous renal enhancement. Symmetric excretion on delayed phase imaging. Tiny cortical hypodensities in both kidneys are too small to characterize but likely small cyst. Partially distended urinary bladder with wall thickening at the dome. Stomach/Bowel: Mild wall thickening of the distal esophagus. Gastric bypass anatomy.  Minimal fluid in the excluded gastric remnant. The Roux limb is nondilated, but redundant. Jejunal anastomosis is minimally patulous. There is no bowel obstruction, inflammation, pneumatosis or ischemia. Few fluid-filled loops of small bowel in the pelvis. Normal appendix. Small volume of formed stool in the colon. The sigmoid colon is redundant. No colonic inflammation. Vascular/Lymphatic: Mild aortic atherosclerosis. No aortic aneurysm. Patent portal vein. No enlarged lymph nodes in the abdomen or pelvis. Reproductive: Hysterectomy.  No adnexal mass. Other: No free air, free fluid or focal fluid collection. No abdominal wall hernia. Musculoskeletal: Lower lumbar facet hypertrophy. Grade 1 anterolisthesis of L4 on L5 is likely facet mediated. There are no acute or suspicious osseous abnormalities. Incidental bone island in the pelvis. IMPRESSION: 1. Gastric bypass anatomy without bowel obstruction or inflammation. The Roux limb is redundant. 2. Layering stones or sludge in the gallbladder which is mildly distended. There is slight indistinctness of the pericholecystic fat, which can be seen with acute cholecystitis in the appropriate clinical setting. Recommend right upper quadrant ultrasound for further evaluation. 3. Bilateral nonobstructing renal calculi. 4. Mild wall thickening of the distal esophagus, can be seen with reflux or esophagitis. 5. Urinary bladder wall thickening at the  dome, recommend correlation with urinalysis to exclude urinary tract infection. Aortic Atherosclerosis (ICD10-I70.0). Electronically Signed   By: Keith Rake M.D.   On: 02/17/2021 21:51   US Abdomen Limited RUQ (LIVER/GB)  Result Date: 02/17/2021 CLINICAL DATA:  Right upper quadrant pain and right flank pain. EXAM: ULTRASOUND ABDOMEN LIMITED RIGHT UPPER QUADRANT COMPARISON:  Abdomen and pelvis CT, dated February 17, 2021 FINDINGS: Gallbladder: Echogenic sludge and shadowing echogenic gallstones are seen within the distended gallbladder. The largest measures approximately 1.1 cm x 0.6 cm x 0.7 cm. A 0.5 cm x 0.5 cm x 0.6 cm gallstone is seen within the neck of the gallbladder. There is no evidence of gallbladder wall thickening (2.1 mm). No sonographic Murphy sign noted by sonographer. Common bile duct: Diameter: 6.6 mm Liver: No focal lesion identified. Within normal limits in parenchymal echogenicity. Portal vein is patent on color Doppler imaging with normal direction of blood flow towards the liver. Other: None. IMPRESSION: Cholelithiasis and gallbladder sludge, without evidence of acute cholecystitis. Further evaluation with a nuclear medicine hepatobiliary scan is recommended if this remains of clinical concern. Electronically Signed   By: Virgina Norfolk M.D.   On: 02/17/2021 22:58    EKG: not done   Labs on Admission: I have personally reviewed the available labs and imaging studies at the time of the admission.  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 Bobbye Morton consulted  S/p gastric bypass -Most medical problems have resolved since surgery -No longer takes medications for HTN, HLD, DM -No longer needs CPAP -Continue Neurontin qhs and prn Albuterol HFA -Body mass index is 26.89 kg/m.    Note: This patient has been tested and is negative for the novel coronavirus COVID-19. The patient  has NOT been vaccinated against COVID-19.   Level of care: Med-Surg DVT prophylaxis:   SCDs Code Status:  Full - confirmed with patient Family Communication: None present Disposition Plan:  The patient is from: home  Anticipated d/c is to: home without [facility] services   Anticipated d/c date will depend on clinical response to treatment, but possibly as early as tomorrow if she has excellent response to treatment  Patient is currently: acutely ill Consults called: Surgery  Admission status:  It is my clinical opinion that referral for OBSERVATION is reasonable and necessary in this patient based on the above information provided. The aforementioned taken together are felt to place the patient at high risk for further clinical deterioration. However it is anticipated that the patient may be medically stable for discharge from the hospital within 24 to 48 hours.    Karmen Bongo MD [facility]   How to contact the [facility] Attending or Consulting provider [place] or covering provider during after hours [place], for this patient?  Check the care team in [facility] and look for a) attending/consulting [facility] provider listed and b) the [facility] team listed Log into [email or web address] and use [place]'s universal password to access. If you do not have the password, please contact the hospital operator. Locate the [facility] provider you are looking for under [facility] and  page to a number that you can be directly reached. If you still have difficulty reaching the provider, please page the [facility] (Director on Call) for the Hospitalists listed on amion for assistance.   02/18/2021, 10:18 AM

## 2021-02-18 NOTE — Consult Note (Signed)
Reason for Consult/Chief Complaint: abdominal pain, r/o biliary etiology Consultant: Karle Starch, MD  Sandra [name] is an 60 y.o. female.   HPI: 76F with abdominal pain that began 02/15/2021.She had associated anorexia that began 02/17/2021. One episode of vomiting PTA. She called Dr. Carlean Purl, her GI who has seen her in the past for gastric ulcers, who recommended she come into the ED.  Pain is localized to the b/l lower quadrants with pain most intensely felt in the mid lower abdomen. Denies fevers. Last BM 10/22 ~1700, has baseline constipation and this was a normal bowel movement for her. Last colonoscopy 03/2020 by Dr. Carlean Purl.   H/o GBP and hysterectomy, tubal ligation, and bladder tacking. Most recent surgery was the GBP in 2015 by Dr.Newman.   Past Medical History:  Diagnosis Date   Anxiety    Arthritis    CAD (coronary artery disease)    nonobstructive CAD in LAD per 2013 CTA coronary morph   Cancer ([place]) 2006   uterine   Complication of anesthesia PT STATES HAS LETTER FROM [place] [facility]. STATING THAT SHE HAS A VERY SMALL AIRWAY (LAST SURG. 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 HYSTERECTOMY   STAGE I ENDOMETROID CARCINOMA   Hx of adenomatous colonic polyps 2011   BENIGN   Hyperlipidemia    no meds now   Hypertension    Morbid obesity, weight - 203, BMI - 38.4 05/12/2013    OSA on CPAP    cpap setting of 16   Sleep apnea    no CPAP since surgery    Past Surgical History:  Procedure Laterality Date   ABDOMINAL HYSTERECTOMY  12-20-2004   TAH   BILATERAL BREAST REDUCTION  11-16-1999   BREATH TEK H PYLORI N/A 05/27/2013   Procedure: [place];  Surgeon: Shann Medal, MD;  Location: [facility];  Service: General;  Laterality: N/A;   [place] N/A 04/25/2014   Procedure: COLONOSCOPY;  Surgeon: Gatha Mayer, MD;  Location: [facility];  Service: Endoscopy;  Laterality: N/A;   COLONOSCOPY WITH PROPOFOL N/A 07/26/2019   Procedure: COLONOSCOPY WITH PROPOFOL;  Surgeon: Gatha Mayer, MD;  Location: [facility];  Service: Endoscopy;  Laterality: N/A;   CYSTOSCOPY  09/06/2011   Procedure: CYSTOSCOPY;  Surgeon: Ailene Rud, MD;  Location: [facility];  Service: Urology;  Laterality: N/A;  LYNX SLING    ESOPHAGOGASTRODUODENOSCOPY (EGD) WITH PROPOFOL N/A 09/27/2014   Procedure: ESOPHAGOGASTRODUODENOSCOPY (EGD) WITH PROPOFOL;  Surgeon: Gatha Mayer, MD;  Location: [facility];  Service: Endoscopy;  Laterality: N/A;   ESOPHAGOGASTRODUODENOSCOPY (EGD) WITH PROPOFOL N/A 03/11/2016   Procedure: ESOPHAGOGASTRODUODENOSCOPY (EGD) WITH PROPOFOL;  Surgeon: Jerene Bears,  MD;  Location: [facility];  Service: Endoscopy;  Laterality: N/A;   GASTRIC BYPASS  10/04/2013   GASTRIC ROUX-EN-Y N/A 10/04/2013   Procedure: LAPAROSCOPIC ROUX-EN-Y GASTRIC BYPASS WITH UPPER ENDOSCOPY;  Surgeon: Shann Medal, MD;  Location: [facility];  Service: General;  Laterality: N/A;   HEMORRHOID BANDING  02/2013   POLYPECTOMY     POLYPECTOMY  07/26/2019   Procedure: POLYPECTOMY;  Surgeon: Gatha Mayer, MD;  Location: [facility];  Service: Endoscopy;;   PUBOVAGINAL SLING  09/06/2011   Procedure: Gaynelle Arabian;  Surgeon: Ailene Rud, MD;  Location: [facility];  Service: Urology;   Laterality: N/A;   REDUCTION MAMMAPLASTY Bilateral 2001   SHOULDER ARTHROSCOPY Right 10/23/2017   Procedure: RIGHT SHOULDER ARTHROSCOPIC ROTATOR CUFF REPAIR VS. DEBRIDEMENT, SUBACROMIAL DECOMPARESSION, DISTAL CLAVICAL EXCISION;  Surgeon: Tania Ade, MD;  Location: [place];  Service: Orthopedics;  Laterality: Right;   UVULOPALATOPHARYNGOPLASTY, TONSILLECTOMY AND SEPTOPLASTY  1994    Family History  Adopted: Yes    Social History:  reports that she has never smoked. She has never used smokeless tobacco. She reports that she does not drink alcohol and does not use drugs.  Allergies:  Allergies  Allergen Reactions   Nsaids     r/t gastric surgery   Statins     Medications: I have reviewed the patient's current medications.  Results for orders placed or performed during the hospital encounter of 02/17/21 (from the past 48 hour(s))  Comprehensive metabolic panel     Status: Abnormal   Collection Time: 02/17/21  8:52 PM  Result Value Ref Range   Sodium 141 135 - 145 mmol/L   Potassium 3.9 3.5 - 5.1 mmol/L   Chloride 102 98 - 111 mmol/L   CO2 25 22 - 32 mmol/L   Glucose, Bld 174 (H) 70 - 99 mg/dL    Comment: Glucose reference range applies only to samples taken after fasting for at least 8 hours.   BUN 24 (H) 8 - 23 mg/dL   Creatinine, Ser 0.65 0.44 - 1.00 mg/dL   Calcium 10.0 8.9 - 10.3 mg/dL   Total Protein 7.3 6.5 - 8.1 g/dL   Albumin 4.9 3.5 - 5.0 g/dL   AST 16 15 - 41 U/L   ALT 17 0 - 44 U/L   Alkaline Phosphatase 49 38 - 126 U/L   Total Bilirubin 0.3 0.3 - 1.2 mg/dL   GFR, Estimated >60 >60 mL/min    Comment: (NOTE) Calculated using the CKD-EPI Creatinine Equation (2021)    Anion gap 14 5 - 15    Comment: Performed at [facility], [place], [place] [address]  Lipase, blood     Status: None   Collection Time: 02/17/21  8:52 PM  Result Value Ref Range   Lipase 46 11 - 51 U/L    Comment: Performed at [facility],  [address] [place], [place] [address]  CBC with Differential     Status: Abnormal   Collection Time: 02/17/21  8:52 PM  Result Value Ref Range   WBC 14.6 (H) 4.0 - 10.5 K/uL   RBC 4.68 3.87 - 5.11 MIL/uL   Hemoglobin 14.6 12.0 - 15.0 g/dL   HCT 43.8 36.0 - 46.0 %   MCV 93.6 80.0 - 100.0 fL   MCH 31.2 26.0 - 34.0 pg   MCHC 33.3 30.0 - 36.0 g/dL   RDW 12.6 11.5 - 15.5 %   Platelets 270 150 - 400 K/uL  nRBC 0.0 0.0 - 0.2 %   Neutrophils Relative % 72 %   Neutro Abs 10.6 (H) 1.7 - 7.7 K/uL   Lymphocytes Relative 21 %   Lymphs Abs 3.0 0.7 - 4.0 K/uL   Monocytes Relative 5 %   Monocytes Absolute 0.7 0.1 - 1.0 K/uL   Eosinophils Relative 1 %   Eosinophils Absolute 0.1 0.0 - 0.5 K/uL   Basophils Relative 0 %   Basophils Absolute 0.1 0.0 - 0.1 K/uL   Immature Granulocytes 1 %   Abs Immature Granulocytes 0.08 (H) 0.00 - 0.07 K/uL    Comment: Performed at [facility], [place], [place] [address]  Resp Panel by RT-PCR (Flu A&B, Covid) Nasopharyngeal Swab     Status: None   Collection Time: 02/18/21 12:23 AM   Specimen: Nasopharyngeal Swab; Nasopharyngeal(NP) swabs in vial transport medium  Result Value Ref Range   SARS Coronavirus 2 by RT PCR NEGATIVE NEGATIVE    Comment: (NOTE) SARS-CoV-2 target nucleic acids are NOT DETECTED.  The SARS-CoV-2 RNA is generally detectable in upper respiratory specimens during the acute phase of infection. The lowest concentration of SARS-CoV-2 viral copies this assay can detect is 138 copies/mL. A negative result does not preclude SARS-Cov-2 infection and should not be used as the sole basis for treatment or other patient management decisions. A negative result may occur with  improper specimen collection/handling, submission of specimen other than nasopharyngeal swab, presence of viral mutation(s) within the areas targeted by this assay, and inadequate number of viral copies(<138 copies/mL). A negative  result must be combined with clinical observations, patient history, and epidemiological information. The expected result is Negative.  Fact Sheet for Patients:  EntrepreneurPulse.com.au  Fact Sheet for Healthcare Providers:  IncredibleEmployment.be  This test is no t yet approved or cleared by the [place] FDA and  has been authorized for detection and/or diagnosis of SARS-CoV-2 by FDA under an Emergency Use Authorization (EUA). This EUA will remain  in effect (meaning this test can be used) for the duration of the COVID-19 declaration under Section 564(b)(1) of the Act, 21 U.S.C.section 360bbb-3(b)(1), unless the authorization is terminated  or revoked sooner.       Influenza A by PCR NEGATIVE NEGATIVE   Influenza B by PCR NEGATIVE NEGATIVE    Comment: (NOTE) The Xpert Xpress SARS-CoV-2/FLU/RSV plus assay is intended as an aid in the diagnosis of influenza from Nasopharyngeal swab specimens and should not be used as a sole basis for treatment. Nasal washings and aspirates are unacceptable for Xpert Xpress SARS-CoV-2/FLU/RSV testing.  Fact Sheet for Patients: EntrepreneurPulse.com.au  Fact Sheet for Healthcare Providers: IncredibleEmployment.be  This test is not yet approved or cleared by the [place] FDA and has been authorized for detection and/or diagnosis of SARS-CoV-2 by FDA under an Emergency Use Authorization (EUA). This EUA will remain in effect (meaning this test can be used) for the duration of the COVID-19 declaration under Section 564(b)(1) of the Act, 21 U.S.C. section 360bbb-3(b)(1), unless the authorization is terminated or revoked.  Performed at [facility], [address] [place], [place] [address]     CT Abdomen Pelvis W Contrast  Result Date: 02/17/2021 CLINICAL DATA:  Bowel obstruction suspected Abdominal pain.  Episode of vomiting. EXAM: CT  ABDOMEN AND PELVIS WITH CONTRAST TECHNIQUE: Multidetector CT imaging of the abdomen and pelvis was performed using the standard protocol following bolus administration of intravenous contrast. CONTRAST:  177mL OMNIPAQUE IOHEXOL 300 MG/ML  SOLN COMPARISON:  Noncontrast CT 08/19/2014 FINDINGS: Lower chest: No acute airspace disease or pleural effusion. Heart size normal. Hepatobiliary: No focal hepatic lesion. Layering stones or sludge in the gallbladder which is mildly distended. There is slight indistinctness of the pericholecystic fat. No visualized choledocholithiasis. Upper normal common bile duct measuring 7 mm at the porta hepatis. Pancreas: No ductal dilatation or inflammation. Single calcification in the pancreatic body. Spleen: Normal in size. Lobulated hypodensity in the periphery of the inferior spleen, likely a hemangioma. Adrenals/Urinary Tract: Slight adrenal thickening without dominant nodule. Small nonobstructing stone in the lower right kidney. Punctate nonobstructing stone in the mid lower left kidney. No hydronephrosis. Homogeneous renal enhancement. Symmetric excretion on delayed phase imaging. Tiny cortical hypodensities in both kidneys are too small to characterize but likely small cyst. Partially distended urinary bladder with wall thickening at the dome. Stomach/Bowel: Mild wall thickening of the distal esophagus. Gastric bypass anatomy. Minimal fluid in the excluded gastric remnant. The Roux limb is nondilated, but redundant. Jejunal anastomosis is minimally patulous. There is no bowel obstruction, inflammation, pneumatosis or ischemia. Few fluid-filled loops of small bowel in the pelvis. Normal appendix. Small volume of formed stool in the colon. The sigmoid colon is redundant. No colonic inflammation. Vascular/Lymphatic: Mild aortic atherosclerosis. No aortic aneurysm. Patent portal vein. No enlarged lymph nodes in the abdomen or pelvis. Reproductive: Hysterectomy.  No adnexal mass. Other:  No free air, free fluid or focal fluid collection. No abdominal wall hernia. Musculoskeletal: Lower lumbar facet hypertrophy. Grade 1 anterolisthesis of L4 on L5 is likely facet mediated. There are no acute or suspicious osseous abnormalities. Incidental bone island in the pelvis. IMPRESSION: 1. Gastric bypass anatomy without bowel obstruction or inflammation. The Roux limb is redundant. 2. Layering stones or sludge in the gallbladder which is mildly distended. There is slight indistinctness of the pericholecystic fat, which can be seen with acute cholecystitis in the appropriate clinical setting. Recommend right upper quadrant ultrasound for further evaluation. 3. Bilateral nonobstructing renal calculi. 4. Mild wall thickening of the distal esophagus, can be seen with reflux or esophagitis. 5. Urinary bladder wall thickening at the dome, recommend correlation with urinalysis to exclude urinary tract infection. Aortic Atherosclerosis (ICD10-I70.0). Electronically Signed   By: Keith Rake M.D.   On: 02/17/2021 21:51   US Abdomen Limited RUQ (LIVER/GB)  Result Date: 02/17/2021 CLINICAL DATA:  Right upper quadrant pain and right flank pain. EXAM: ULTRASOUND ABDOMEN LIMITED RIGHT UPPER QUADRANT COMPARISON:  Abdomen and pelvis CT, dated February 17, 2021 FINDINGS: Gallbladder: Echogenic sludge and shadowing echogenic gallstones are seen within the distended gallbladder. The largest measures approximately 1.1 cm x 0.6 cm x 0.7 cm. A 0.5 cm x 0.5 cm x 0.6 cm gallstone is seen within the neck of the gallbladder. There is no evidence of gallbladder wall thickening (2.1 mm). No sonographic Murphy sign noted by sonographer. Common bile duct: Diameter: 6.6 mm Liver: No focal lesion identified. Within normal limits in parenchymal echogenicity. Portal vein is patent on color Doppler imaging with normal direction of blood flow towards the liver. Other: None. IMPRESSION: Cholelithiasis and gallbladder sludge, without  evidence of acute cholecystitis. Further evaluation with a nuclear medicine hepatobiliary scan is recommended if this remains of clinical concern. Electronically Signed   By: Virgina Norfolk M.D.   On: 02/17/2021 22:58    Review of Systems  Neurological:  Negative for loss of consciousness.  10 point review of systems is negative except as listed above in HPI.   Physical Exam Blood pressure Marland Kitchen)  182/96, pulse 69, temperature 99.1 F (37.3 C), temperature source Oral, resp. rate 16, height 5\' 2"  (1.575 m), weight 66.7 kg, SpO2 98 %. Constitutional: well-developed, well-nourished HEENT: pupils equal, round, reactive to light, 69mm b/l, moist conjunctiva, external inspection of ears and nose normal, hearing intact Oropharynx: normal oropharyngeal mucosa, poor dentition Neck: no thyromegaly, trachea midline, no midline cervical tenderness to palpation Chest: breath sounds equal bilaterally, normal respiratory effort, no midline or lateral chest wall tenderness to palpation/deformity Abdomen: soft, central lower abdominal TTP, mild TTP b/l lower quadrants, no bruising, no hepatosplenomegaly GU: normal female genitalia  Back: no wounds, no thoracic/lumbar spine tenderness to palpation, no thoracic/lumbar spine stepoffs Rectal: deferred Extremities: 2+ radial and pedal pulses bilaterally, intact motor and sensation bilateral UE and LE, no peripheral edema MSK: unable to assess gait/station, no clubbing/cyanosis of fingers/toes, normal ROM of all four extremities Skin: warm, dry, no rashes Psych: normal memory, normal mood/affect    Assessment/Plan: 62F with abdominal pain. Consult requested to evaluate for biliary etiology. Unlikely biliary etiology given location of pain and absence of findings on CT or [place] to support acute cholecystitis, but plan for HIDA today. Will f/u after imaging complete.    Jesusita Oka, MD General and [facility]

## 2021-02-18 NOTE — Plan of Care (Signed)
Patient resting in bed. Complaints of abdominal pain. No concerns voiced at present. Call bell within reach.

## 2021-02-19 ENCOUNTER — Observation Stay (HOSPITAL_COMMUNITY): Payer: 59

## 2021-02-19 DIAGNOSIS — E1165 Type 2 diabetes mellitus with hyperglycemia: Secondary | ICD-10-CM | POA: Diagnosis present

## 2021-02-19 DIAGNOSIS — F419 Anxiety disorder, unspecified: Secondary | ICD-10-CM | POA: Diagnosis present

## 2021-02-19 DIAGNOSIS — R1011 Right upper quadrant pain: Secondary | ICD-10-CM | POA: Diagnosis not present

## 2021-02-19 DIAGNOSIS — R1084 Generalized abdominal pain: Secondary | ICD-10-CM

## 2021-02-19 DIAGNOSIS — Z8542 Personal history of malignant neoplasm of other parts of uterus: Secondary | ICD-10-CM | POA: Diagnosis not present

## 2021-02-19 DIAGNOSIS — I10 Essential (primary) hypertension: Secondary | ICD-10-CM | POA: Diagnosis present

## 2021-02-19 DIAGNOSIS — K289 Gastrojejunal ulcer, unspecified as acute or chronic, without hemorrhage or perforation: Secondary | ICD-10-CM | POA: Diagnosis present

## 2021-02-19 DIAGNOSIS — Z79899 Other long term (current) drug therapy: Secondary | ICD-10-CM | POA: Diagnosis not present

## 2021-02-19 DIAGNOSIS — Z28310 Unvaccinated for covid-19: Secondary | ICD-10-CM | POA: Diagnosis not present

## 2021-02-19 DIAGNOSIS — Z888 Allergy status to other drugs, medicaments and biological substances status: Secondary | ICD-10-CM | POA: Diagnosis not present

## 2021-02-19 DIAGNOSIS — Z8719 Personal history of other diseases of the digestive system: Secondary | ICD-10-CM | POA: Diagnosis not present

## 2021-02-19 DIAGNOSIS — E785 Hyperlipidemia, unspecified: Secondary | ICD-10-CM | POA: Diagnosis present

## 2021-02-19 DIAGNOSIS — F32A Depression, unspecified: Secondary | ICD-10-CM | POA: Diagnosis present

## 2021-02-19 DIAGNOSIS — G4733 Obstructive sleep apnea (adult) (pediatric): Secondary | ICD-10-CM | POA: Diagnosis present

## 2021-02-19 DIAGNOSIS — Z9884 Bariatric surgery status: Secondary | ICD-10-CM | POA: Diagnosis not present

## 2021-02-19 DIAGNOSIS — Z886 Allergy status to analgesic agent status: Secondary | ICD-10-CM | POA: Diagnosis not present

## 2021-02-19 DIAGNOSIS — N200 Calculus of kidney: Secondary | ICD-10-CM | POA: Diagnosis present

## 2021-02-19 DIAGNOSIS — Z90710 Acquired absence of both cervix and uterus: Secondary | ICD-10-CM | POA: Diagnosis not present

## 2021-02-19 DIAGNOSIS — Z86010 Personal history of colonic polyps: Secondary | ICD-10-CM | POA: Diagnosis not present

## 2021-02-19 DIAGNOSIS — K219 Gastro-esophageal reflux disease without esophagitis: Secondary | ICD-10-CM | POA: Diagnosis present

## 2021-02-19 DIAGNOSIS — I2510 Atherosclerotic heart disease of native coronary artery without angina pectoris: Secondary | ICD-10-CM | POA: Diagnosis present

## 2021-02-19 DIAGNOSIS — Z20822 Contact with and (suspected) exposure to covid-19: Secondary | ICD-10-CM | POA: Diagnosis present

## 2021-02-19 LAB — URINALYSIS, ROUTINE W REFLEX MICROSCOPIC
Bilirubin Urine: NEGATIVE
Glucose, UA: NEGATIVE mg/dL
Ketones, ur: 5 mg/dL — AB
Leukocytes,Ua: NEGATIVE
Nitrite: NEGATIVE
Protein, ur: 30 mg/dL — AB
Specific Gravity, Urine: 1.028 (ref 1.005–1.030)
pH: 5 (ref 5.0–8.0)

## 2021-02-19 LAB — CBC
HCT: 44.1 % (ref 36.0–46.0)
Hemoglobin: 14.1 g/dL (ref 12.0–15.0)
MCH: 30.9 pg (ref 26.0–34.0)
MCHC: 32 g/dL (ref 30.0–36.0)
MCV: 96.5 fL (ref 80.0–100.0)
Platelets: 218 10*3/uL (ref 150–400)
RBC: 4.57 MIL/uL (ref 3.87–5.11)
RDW: 13 % (ref 11.5–15.5)
WBC: 15.9 10*3/uL — ABNORMAL HIGH (ref 4.0–10.5)
nRBC: 0 % (ref 0.0–0.2)

## 2021-02-19 LAB — BASIC METABOLIC PANEL
Anion gap: 9 (ref 5–15)
BUN: 11 mg/dL (ref 8–23)
CO2: 26 mmol/L (ref 22–32)
Calcium: 9.2 mg/dL (ref 8.9–10.3)
Chloride: 100 mmol/L (ref 98–111)
Creatinine, Ser: 0.63 mg/dL (ref 0.44–1.00)
GFR, Estimated: >60 mL/min (ref >60)
Glucose, Bld: 163 mg/dL — ABNORMAL HIGH (ref 70–99)
Potassium: 4 mmol/L (ref 3.5–5.1)
Sodium: 135 mmol/L (ref 135–145)

## 2021-02-19 LAB — GLUCOSE, CAPILLARY
Glucose-Capillary: 156 mg/dL — ABNORMAL HIGH (ref 70–99)
Glucose-Capillary: 153 mg/dL — ABNORMAL HIGH (ref 70–99)

## 2021-02-19 MED ORDER — PANTOPRAZOLE SODIUM 40 MG IV SOLR
40.0000 mg | Freq: Two times a day (BID) | INTRAVENOUS | Status: DC
  Administered 2021-02-19: 40 mg via INTRAVENOUS
  Filled 2021-02-19: qty 40

## 2021-02-19 MED ORDER — INSULIN ASPART 100 UNIT/ML IJ SOLN
0.0000 [IU] | Freq: Three times a day (TID) | INTRAMUSCULAR | Status: DC
  Administered 2021-02-20: 1 [IU] via SUBCUTANEOUS

## 2021-02-19 MED ORDER — TECHNETIUM TC 99M MEBROFENIN IV KIT
5.5000 | PACK | Freq: Once | INTRAVENOUS | Status: AC | PRN
  Administered 2021-02-19: 5.5 via INTRAVENOUS

## 2021-02-19 MED ORDER — SODIUM CHLORIDE 0.9 % IV SOLN
1.0000 g | INTRAVENOUS | Status: DC
  Administered 2021-02-19: 1 g via INTRAVENOUS
  Filled 2021-02-19: qty 10

## 2021-02-19 MED ORDER — INSULIN ASPART 100 UNIT/ML IJ SOLN: 0.0000 [IU] | Freq: Every day | INTRAMUSCULAR | Status: DC

## 2021-02-19 MED ORDER — MORPHINE BOLUS VIA INFUSION: 2.7000 mg | Freq: Once | INTRAVENOUS | Status: DC

## 2021-02-19 MED ORDER — MORPHINE SULFATE (PF) 4 MG/ML IV SOLN
3.0000 mg | Freq: Once | INTRAVENOUS | Status: AC
Start: 2021-02-19 — End: 2021-02-19
  Administered 2021-02-19: 2.7 mg via INTRAVENOUS
  Filled 2021-02-19: qty 1

## 2021-02-19 NOTE — Progress Notes (Signed)
HIDA complete, negative for cholecystitis.  Surgery will sign off at this point.  Please call if any questions or issues.

## 2021-02-19 NOTE — Consult Note (Addendum)
Consultation  Referring Provider:  Dr. Nevada Crane    Primary Care Physician:  [name], MD Primary Gastroenterologist:  Dr. Carlean Purl       Reason for Consultation:  Epigastric Pain            HPI:   Sandra [name] is a 60 y.o. female with a past medical history significant for CAD, endometrial cancer status post TAH (2006), small airway, gastrojejunal marginal ulcer (2016), hypertension, OSA no longer on CPAP and status post gastric bypass who presented to the ED on 02/17/2021 with abdominal pain.  Initially LFTs were thought related to acute cholecystitis but HIDA scan was negative today and surgery has signed off, therefore we were consulted due to ongoing pain.    Today, patient is seen with her daughter and granddaughter by her bedside.  They do assist with history.  Her daughter tells me that she has been complaining of an "ulcer" over the past few weeks at least, "but this wasn't that bad", but Thursday of last week 02/15/2021 patient started with what she thought was a GI bug after her granddaughter had had it earlier in the week with loose bowel movements and nausea.  This seemed to last for about 48 hours and on Saturday morning 10/22 she woke up and started vomiting, later that day around 4 PM after she had had just a few peanut butter crackers she started with an epigastric pain rated as an initial 7-8/10 which worsened over the next 3 hours to the point where she was wanting to go to the ER.  She tried taking one prescription Nexium and Carafate x3 but this didn't help.  Her pain seemed to radiate from her epigastrium over to the right upper side of her abdomen.    Tells me that since admission she really has not eaten at all because pain remains at an 8/10 regardless of pain medicines.  Tells me in fact she had to stop her pain medication at midnight due to HIDA scan scheduled today and had an intense increase in pain, more so now just in her epigastrium.  Also some continued  nausea.  She has not had a bowel movement for at least 2 days.  No vomiting.    Denies fever, chills, weight loss, blood in her stool or symptoms that awaken her from sleep.  ED/Hospital Course: WBC 14,600, CT with sludge and slightly distended gallbladder and slightly indistinct pericholecystic fat, but no evidence for acute cholecystitis on right upper quadrant ultrasound, patient moved from droppage to [facility] in order to have HIDA scan, HIDA scan completed today and negative for acute cholecystitis, white count up to 15.9 today, LFTs normal  GI History: 07/26/2019 colonoscopy Dr. Carlean Purl with one 2 to 3 mm polyp in the descending colon; repeat recommended in 7 years 03/11/2016 EGD Dr. Hilarie Fredrickson in the hospital with normal esophagus, Z-line regular, normal mucosa in the gastric pouch, Roux-en-Y gastrojejunostomy with clean-based anastomotic ulcer ; at that time recommend twice daily PPI capsule that could be opened and sprinkled on food for better absorption  Past Medical History:  Diagnosis Date  . Anxiety   . Arthritis   . CAD (coronary artery disease)    nonobstructive CAD in LAD per 2013 CTA coronary morph  . Depression   . Diabetes mellitus    no meds taken now, checks cbg once or twice a week   . Difficult intubation    had to use the glidescope due to small  airway   . 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 GERD (gastroesophageal reflux disease)   . H/O gastric bypass   . Heart murmur    with last visit, MD states "outgrown" murmur  . Hemorrhoids, internal, with bleeding and Grade 2 prolapse 03/05/2013   All positions seen an anoscopy RP columns banded 03/05/2013 LL and RA banded 03/18/2013     . History of endometrial cancer 2006 ---- S/P ABD. HYSTERECTOMY   STAGE I ENDOMETROID CARCINOMA  . Hx of adenomatous colonic polyps 2011   BENIGN  . Hyperlipidemia    no meds now  . Hypertension   . Sleep apnea    no CPAP since surgery    Past Surgical History:  Procedure  Laterality Date  . ABDOMINAL HYSTERECTOMY  12-20-2004   TAH  . BILATERAL BREAST REDUCTION  11-16-1999  . BREATH TEK H PYLORI N/A 05/27/2013   Procedure: BREATH TEK H PYLORI;  Surgeon: Shann Medal, MD;  Location: [facility];  Service: General;  Laterality: N/A;  . [place]  . COLONOSCOPY    . COLONOSCOPY N/A 04/25/2014   Procedure: COLONOSCOPY;  Surgeon: Gatha Mayer, MD;  Location: [facility];  Service: Endoscopy;  Laterality: N/A;  . COLONOSCOPY WITH PROPOFOL N/A 07/26/2019   Procedure: COLONOSCOPY WITH PROPOFOL;  Surgeon: Gatha Mayer, MD;  Location: [facility];  Service: Endoscopy;  Laterality: N/A;  . CYSTOSCOPY  09/06/2011   Procedure: CYSTOSCOPY;  Surgeon: Ailene Rud, MD;  Location: [facility];  Service: Urology;  Laterality: N/A;  LYNX SLING   . ESOPHAGOGASTRODUODENOSCOPY (EGD) WITH PROPOFOL N/A 09/27/2014   Procedure: ESOPHAGOGASTRODUODENOSCOPY (EGD) WITH PROPOFOL;  Surgeon: Gatha Mayer, MD;  Location: [facility];  Service: Endoscopy;  Laterality: N/A;  . ESOPHAGOGASTRODUODENOSCOPY (EGD) WITH PROPOFOL N/A 03/11/2016   Procedure: ESOPHAGOGASTRODUODENOSCOPY (EGD) WITH PROPOFOL;  Surgeon: Jerene Bears, MD;  Location: [facility];  Service: Endoscopy;  Laterality: N/A;  . GASTRIC BYPASS  10/04/2013  . GASTRIC ROUX-EN-Y N/A 10/04/2013   Procedure: LAPAROSCOPIC ROUX-EN-Y GASTRIC BYPASS WITH UPPER ENDOSCOPY;  Surgeon: Shann Medal, MD;  Location: [facility];  Service: General;  Laterality: N/A;  . HEMORRHOID BANDING  02/2013  . POLYPECTOMY    . POLYPECTOMY  07/26/2019   Procedure: POLYPECTOMY;  Surgeon: Gatha Mayer, MD;  Location: [facility];  Service: Endoscopy;;  . PUBOVAGINAL SLING  09/06/2011   Procedure: Gaynelle Arabian;  Surgeon: Ailene Rud, MD;  Location: [facility];  Service: Urology;  Laterality: N/A;  . REDUCTION MAMMAPLASTY Bilateral 2001  . SHOULDER ARTHROSCOPY Right 10/23/2017   Procedure:  RIGHT SHOULDER ARTHROSCOPIC ROTATOR CUFF REPAIR VS. DEBRIDEMENT, SUBACROMIAL DECOMPARESSION, DISTAL CLAVICAL EXCISION;  Surgeon: Tania Ade, MD;  Location: [place];  Service: Orthopedics;  Laterality: Right;  . UVULOPALATOPHARYNGOPLASTY, TONSILLECTOMY AND SEPTOPLASTY  1994    Family History  Adopted: Yes    Social History   Tobacco Use  . Smoking status: Never  . Smokeless tobacco: Never  Vaping Use  . Vaping Use: Never used  Substance Use Topics  . Alcohol use: No    Alcohol/week: 0.0 standard drinks  . Drug use: No    Prior to Admission medications   Medication Sig Start Date End Date Taking? Authorizing Provider  albuterol (VENTOLIN HFA) 108 (90 Base) MCG/ACT inhaler Inhale 1-2 puffs into the lungs every 4 (four) hours as needed for wheezing or shortness of breath. 01/10/20  Yes Harris, Abigail, PA-C  gabapentin (NEURONTIN) 300  MG capsule TAKE 1 CAPSULE BY MOUTH 3 TIMES DAILY. Patient taking differently: Take 300 mg by mouth at bedtime. 06/19/20 06/19/21 Yes Lyndal Pulley, DO  Multiple Vitamins-Minerals (MULTIVITAMIN PO) Take 2 tablets by mouth daily.    Yes [provider]  ondansetron (ZOFRAN-ODT) 4 MG disintegrating tablet DISSOLVE 1 TABLET BY MOUTH EVERY 8 HOURS AS NEEDED FOR NAUSEA OR VOMITING (MAY TAKE 2 TABLETS IF 1 TABLET IS INEFFECTIVE) Patient taking differently: Take 4-8 mg by mouth every 8 (eight) hours as needed for nausea or vomiting. 06/26/20 06/26/21 Yes Gatha Mayer, MD  glucose blood (FREESTYLE LITE) test strip Use daily as instructed. 01/05/21   [name], MD    Current Facility-Administered Medications  Medication Dose Route Frequency Provider Last Rate Last Admin  . acetaminophen (TYLENOL) tablet 650 mg  650 mg Oral Q6H PRN Karmen Bongo, MD   650 mg at 02/19/21 8657   Or  . acetaminophen (TYLENOL) suppository 650 mg  650 mg Rectal Q6H PRN Karmen Bongo, MD      . albuterol (PROVENTIL) (2.5 MG/3ML) 0.083% nebulizer solution 2.5 mg  2.5  mg Nebulization Q4H PRN Karmen Bongo, MD      . gabapentin (NEURONTIN) capsule 300 mg  300 mg Oral Ivery Quale, MD   300 mg at 02/18/21 2218  . hydrALAZINE (APRESOLINE) injection 10 mg  10 mg Intravenous Q2H PRN Karmen Bongo, MD      . lactated ringers infusion   Intravenous Continuous Karmen Bongo, MD 75 mL/hr at 02/19/21 1352 New Bag at 02/19/21 1352  . morphine 2 MG/ML injection 2 mg  2 mg Intravenous Q2H PRN Karmen Bongo, MD   2 mg at 02/19/21 1337  . multivitamin with minerals tablet 1 tablet  1 tablet Oral Daily Karmen Bongo, MD      . ondansetron (ZOFRAN-ODT) disintegrating tablet 4 mg  4 mg Oral Q6H PRN Karmen Bongo, MD       Or  . ondansetron [facility]) injection 4 mg  4 mg Intravenous Q6H PRN Karmen Bongo, MD      . oxyCODONE (Oxy IR/ROXICODONE) immediate release tablet 5-10 mg  5-10 mg Oral Q4H PRN Karmen Bongo, MD   10 mg at 02/18/21 1957    Allergies as of 02/17/2021 - Review Complete 02/17/2021  Allergen Reaction Noted  . Nsaids  09/06/2016  . Statins  11/14/2018     Review of Systems:    Constitutional: No weight loss, fever or chills Skin: No rash  Cardiovascular: No chest pain Respiratory: No SOB  Gastrointestinal: See HPI and otherwise negative Genitourinary: No dysuria Neurological: No headache, dizziness or syncope Musculoskeletal: No new muscle or joint pain Hematologic: No bleeding  Psychiatric: No history of depression or anxiety    Physical Exam:  Vital signs in last 24 hours: Temp:  [98.7 F (37.1 C)-99.3 F (37.4 C)] 98.9 F (37.2 C) (10/24 1326) Pulse Rate:  [101-108] 101 (10/24 1326) Resp:  [17-20] 18 (10/24 1326) BP: (122-151)/(73-83) 122/73 (10/24 1326) SpO2:  [94 %-96 %] 94 % (10/24 1326) Last BM Date: 02/16/21 General:   Pleasant Caucasian female appears to be in NAD, Well developed, Well nourished, alert and cooperative Head:  Normocephalic and atraumatic. Eyes:   PEERL, EOMI. No icterus. Conjunctiva  pink. Ears:  Normal auditory acuity. Neck:  Supple Throat: Oral cavity and pharynx without inflammation, swelling or lesion.  Lungs: Respirations even and unlabored. Lungs clear to auscultation bilaterally.   No wheezes, crackles, or rhonchi.  Heart: Normal  S1, S2. No MRG. Regular rate and rhythm. No peripheral edema, cyanosis or pallor.  Abdomen:  Soft, nondistended, marked epigastric TTP with some involuntary guarding, also tender in the right lower quadrant. Normal bowel sounds. No appreciable masses or hepatomegaly. Rectal:  Not performed.  Msk:  Symmetrical without gross deformities. Peripheral pulses intact.  Extremities:  Without edema, no deformity or joint abnormality.  Neurologic:  Alert and  oriented x4;  grossly normal neurologically.  Skin:   Dry and intact without significant lesions or rashes. Psychiatric: Demonstrates good judgement and reason without abnormal affect or behaviors.   LAB RESULTS: Recent Labs    02/17/21 2052 02/19/21 0219  WBC 14.6* 15.9*  HGB 14.6 14.1  HCT 43.8 44.1  PLT 270 218   BMET Recent Labs    02/17/21 2052 02/19/21 0219  NA 141 135  K 3.9 4.0  CL 102 100  CO2 25 26  GLUCOSE 174* 163*  BUN 24* 11  CREATININE 0.65 0.63  CALCIUM 10.0 9.2   LFT Recent Labs    02/17/21 2052  PROT 7.3  ALBUMIN 4.9  AST 16  ALT 17  ALKPHOS 49  BILITOT 0.3    STUDIES: CT Abdomen Pelvis W Contrast  Result Date: 02/17/2021 CLINICAL DATA:  Bowel obstruction suspected Abdominal pain.  Episode of vomiting. EXAM: CT ABDOMEN AND PELVIS WITH CONTRAST TECHNIQUE: Multidetector CT imaging of the abdomen and pelvis was performed using the standard protocol following bolus administration of intravenous contrast. CONTRAST:  151mL OMNIPAQUE IOHEXOL 300 MG/ML  SOLN COMPARISON:  Noncontrast CT 08/19/2014 FINDINGS: Lower chest: No acute airspace disease or pleural effusion. Heart size normal. Hepatobiliary: No focal hepatic lesion. Layering stones or sludge in  the gallbladder which is mildly distended. There is slight indistinctness of the pericholecystic fat. No visualized choledocholithiasis. Upper normal common bile duct measuring 7 mm at the porta hepatis. Pancreas: No ductal dilatation or inflammation. Single calcification in the pancreatic body. Spleen: Normal in size. Lobulated hypodensity in the periphery of the inferior spleen, likely a hemangioma. Adrenals/Urinary Tract: Slight adrenal thickening without dominant nodule. Small nonobstructing stone in the lower right kidney. Punctate nonobstructing stone in the mid lower left kidney. No hydronephrosis. Homogeneous renal enhancement. Symmetric excretion on delayed phase imaging. Tiny cortical hypodensities in both kidneys are too small to characterize but likely small cyst. Partially distended urinary bladder with wall thickening at the dome. Stomach/Bowel: Mild wall thickening of the distal esophagus. Gastric bypass anatomy. Minimal fluid in the excluded gastric remnant. The Roux limb is nondilated, but redundant. Jejunal anastomosis is minimally patulous. There is no bowel obstruction, inflammation, pneumatosis or ischemia. Few fluid-filled loops of small bowel in the pelvis. Normal appendix. Small volume of formed stool in the colon. The sigmoid colon is redundant. No colonic inflammation. Vascular/Lymphatic: Mild aortic atherosclerosis. No aortic aneurysm. Patent portal vein. No enlarged lymph nodes in the abdomen or pelvis. Reproductive: Hysterectomy.  No adnexal mass. Other: No free air, free fluid or focal fluid collection. No abdominal wall hernia. Musculoskeletal: Lower lumbar facet hypertrophy. Grade 1 anterolisthesis of L4 on L5 is likely facet mediated. There are no acute or suspicious osseous abnormalities. Incidental bone island in the pelvis. IMPRESSION: 1. Gastric bypass anatomy without bowel obstruction or inflammation. The Roux limb is redundant. 2. Layering stones or sludge in the gallbladder  which is mildly distended. There is slight indistinctness of the pericholecystic fat, which can be seen with acute cholecystitis in the appropriate clinical setting. Recommend right upper quadrant ultrasound for further  evaluation. 3. Bilateral nonobstructing renal calculi. 4. Mild wall thickening of the distal esophagus, can be seen with reflux or esophagitis. 5. Urinary bladder wall thickening at the dome, recommend correlation with urinalysis to exclude urinary tract infection. Aortic Atherosclerosis (ICD10-I70.0). Electronically Signed   By: Keith Rake M.D.   On: 02/17/2021 21:51   NM Hepato W/EF  Result Date: 02/19/2021 CLINICAL DATA:  Right upper quadrant pain. Assess for cholecystitis. EXAM: NUCLEAR MEDICINE HEPATOBILIARY IMAGING WITH GALLBLADDER EF TECHNIQUE: Sequential images of the abdomen were obtained out to 60 minutes following intravenous administration of radiopharmaceutical. After slow intravenous infusion of 1.77 micrograms Cholecystokinin, gallbladder ejection fraction was determined. RADIOPHARMACEUTICALS:  5.5 mCi Tc-75m  Choletec IV COMPARISON:  Abdominal sonogram 02/17/2021 FINDINGS: Prompt uptake and biliary excretion of activity by the liver is seen. Biliary activity passes into small bowel, consistent with patent common bile duct. After 90 minutes of imaging no gallbladder activity was visualized. The patient received 2.7 mg of morphine the IV infusion. Following the IV administration of morphine the gallbladder was visualized. Calculated gallbladder ejection fraction is 45.7%. (At 60 min, normal ejection fraction is greater than 40%.) IMPRESSION: 1. Patent cystic duct without evidence for acute cholecystitis. 2. Normal gallbladder ejection fraction at 45.7%. Electronically Signed   By: Kerby Moors M.D.   On: 02/19/2021 13:11   US Abdomen Limited RUQ (LIVER/GB)  Result Date: 02/17/2021 CLINICAL DATA:  Right upper quadrant pain and right flank pain. EXAM: ULTRASOUND ABDOMEN  LIMITED RIGHT UPPER QUADRANT COMPARISON:  Abdomen and pelvis CT, dated February 17, 2021 FINDINGS: Gallbladder: Echogenic sludge and shadowing echogenic gallstones are seen within the distended gallbladder. The largest measures approximately 1.1 cm x 0.6 cm x 0.7 cm. A 0.5 cm x 0.5 cm x 0.6 cm gallstone is seen within the neck of the gallbladder. There is no evidence of gallbladder wall thickening (2.1 mm). No sonographic Murphy sign noted by sonographer. Common bile duct: Diameter: 6.6 mm Liver: No focal lesion identified. Within normal limits in parenchymal echogenicity. Portal vein is patent on color Doppler imaging with normal direction of blood flow towards the liver. Other: None. IMPRESSION: Cholelithiasis and gallbladder sludge, without evidence of acute cholecystitis. Further evaluation with a nuclear medicine hepatobiliary scan is recommended if this remains of clinical concern. Electronically Signed   By: Virgina Norfolk M.D.   On: 02/17/2021 22:58      Impression / Plan:  Impression: 1.  Epigastric pain: Initially assumed gallbladder origin, ultrasound with cholelithiasis and gallbladder sludge, no evidence for acute cholecystitis, HIDA scan negative today, previous history of anastomotic ulcers, patient only on prn Nexium (unsure of dose) and prn Carafate (which she hadn't been using; consider ulcer+/- episodic symptomatic cholelithiasis 2.  Leukocytosis: Increased today 14.6--> 15.9; likely with above 3.  History of gastric bypass: With previous anastomotic ulcers in 2017  Plan: 1.  Scheduled patient for EGD tomorrow 02/20/2021 with Dr. Candis Schatz.  Did discuss risks, benefits, limitations and alternatives and patient agrees to proceed. 2.  Patient can be on her regular diet today as tolerated and n.p.o. at midnight 3.  Patient was not on a PPI.  Started IV Pantoprazole 40 mg twice daily.  Discussed that this is likely something she should remain on chronically given her hx of ulcers in the  past 4.  Continue as needed analgesics and antiemetics  Thank you for your kind consultation, we will continue to follow.  Lavone Nian Lemmon  02/19/2021, 3:47 PM   I have taken a history, reviewed  the chart and examined the patient. I performed a substantive portion of this encounter, including complete performance of at least one of the key components, in conjunction with the APP. I agree with the APP's note, impression and recommendations  60 year old female with history of gastric bypass complicated by marginal ulcers, admitted with severe abdominal pain, with associated nausea and vomiting, initially thought to be related to gallstones, but subsequent HIDA normal.  CT abdomen/pelvis also with bladder wall thickening and a UA suggestive of UTI, culture pending.  Patient states that her abdominal pain does not seem very similar to her previous ulcer pain, but I agree that an EGD is warranted to exclude this as a cause.    Will plan for EGD tomorrow Diet as tolerated tonight then NPO after midnight PPI BID today   Elyn Krogh E. Candis Schatz, MD [facility]

## 2021-02-19 NOTE — Progress Notes (Signed)
PROGRESS NOTE  Sandra [name] DPO:[id number] DOB: Sep 08, 1960 DOA: 02/17/2021 PCP: [name], MD  HPI/Recap of past 24 hours: Sandra [name] is a 60 y.o. female with medical history significant of CAD; endometrial CA s/p TAH (2006); DM;  gastrojejunal marginal ulcer (2016); HTN; HLD; OSA no longer on CPAP; and s/p gastric bypass presenting to [facility] ED with complaints of severe abdominal pain.  Associated with nausea and vomiting x1 day.  CT abdomen and pelvis with contrast showed:  1. Gastric bypass anatomy without bowel obstruction or inflammation. The Roux limb is redundant. 2. Layering stones or sludge in the gallbladder which is mildly distended. There is slight indistinctness of the pericholecystic fat, which can be seen with acute cholecystitis in the appropriate clinical setting. Recommend right upper quadrant ultrasound for further evaluation. 3. Bilateral nonobstructing renal calculi. 4. Mild wall thickening of the distal esophagus, can be seen with reflux or esophagitis. 5. Urinary bladder wall thickening at the dome, recommend correlation with urinalysis to exclude urinary tract infection.  02/19/2021: Patient was seen and examined at bedside.  She had a HIDA scan which came back negative for evidence of acute cholecystitis.  Reports abdominal pain epigastric region and periumbilical region.  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 GI consulted IV PPI Protonix 40 mg twice daily  Leukocytosis Rule out active infective process UA, many bacteria Urine culture pending Start Rocephin empirically, DC if urine culture returns negative  Type 2 diabetes with hyperglycemia Last hemoglobin A1c 7.1 on 10/21/2019 Obtain hemoglobin  A1c Start insulin sliding scale.   Code Status: Full code  Family Communication: None at bedside  Disposition Plan: Likely will discharge to home once GI signs of   Consultants: GI  Procedures: None  Antimicrobials: Rocephin  DVT prophylaxis: None  Status is: Inpatient  Inpatient status.  Patient will require at least 2 midnights for further evaluation and treatment of present condition.      Objective: Vitals:   02/18/21 0828 02/18/21 1939 02/19/21 0800 02/19/21 1326  BP: (!) 143/85 (!) 151/83 126/79 122/73  Pulse: (!) 101 (!) 108 (!) 102 (!) 101  Resp: 17 20 17 18   Temp: 99.7 F (37.6 C) 98.7 F (37.1 C) 99.3 F (37.4 C) 98.9 F (37.2 C)  TempSrc: Oral Oral Oral Oral  SpO2: 95% 96% 96% 94%  Weight:      Height:        Intake/Output Summary (Last 24 hours) at 02/19/2021 1621 Last data filed at 02/19/2021 1500 Gross per 24 hour  Intake 2178.08 ml  Output 400 ml  Net 1778.08 ml   Filed Weights   02/17/21 1959  Weight: 66.7 kg    Exam:  General: 60 y.o. year-old female well developed well nourished in no acute distress.  Alert and oriented x3. Cardiovascular: Regular rate and rhythm with no rubs or gallops.  No thyromegaly or JVD noted.   Respiratory: Clear to auscultation with no wheezes or rales. Good inspiratory effort. Abdomen: Soft epigastric tenderness, lower quadrant tenderness with palpation.  Nondistended with normal bowel sounds x4 quadrants. Musculoskeletal: No lower extremity edema. 2/4 pulses in all 4 extremities. Skin: No ulcerative lesions noted or rashes, Psychiatry: Mood is appropriate for condition and setting   Data Reviewed: CBC: Recent Labs  Lab 02/17/21 2052 02/19/21 0219  WBC  14.6* 15.9*  NEUTROABS 10.6*  --   HGB 14.6 14.1  HCT 43.8 44.1  MCV 93.6 96.5  PLT 270 010   Basic Metabolic Panel: Recent Labs  Lab 02/17/21 2052 02/19/21 0219  NA 141 135  K 3.9 4.0  CL 102 100  CO2 25 26  GLUCOSE 174* 163*  BUN  24* 11  CREATININE 0.65 0.63  CALCIUM 10.0 9.2   GFR: Estimated Creatinine Clearance: 66.1 mL/min (by C-G formula based on SCr of 0.63 mg/dL). Liver Function Tests: Recent Labs  Lab 02/17/21 2052  AST 16  ALT 17  ALKPHOS 49  BILITOT 0.3  PROT 7.3  ALBUMIN 4.9   Recent Labs  Lab 02/17/21 2052  LIPASE 46   No results for input(s): AMMONIA in the last 168 hours. Coagulation Profile: No results for input(s): INR, PROTIME in the last 168 hours. Cardiac Enzymes: No results for input(s): CKTOTAL, CKMB, CKMBINDEX, TROPONINI in the last 168 hours. BNP (last 3 results) No results for input(s): PROBNP in the last 8760 hours. HbA1C: No results for input(s): HGBA1C in the last 72 hours. CBG: No results for input(s): GLUCAP in the last 168 hours. Lipid Profile: No results for input(s): CHOL, HDL, LDLCALC, TRIG, CHOLHDL, LDLDIRECT in the last 72 hours. Thyroid Function Tests: No results for input(s): TSH, T4TOTAL, FREET4, T3FREE, THYROIDAB in the last 72 hours. Anemia Panel: No results for input(s): VITAMINB12, FOLATE, FERRITIN, TIBC, IRON, RETICCTPCT in the last 72 hours. Urine analysis:    Component Value Date/Time   COLORURINE AMBER (A) 02/19/2021 0603   APPEARANCEUR TURBID (A) 02/19/2021 0603   LABSPEC 1.028 02/19/2021 0603   PHURINE 5.0 02/19/2021 0603   GLUCOSEU NEGATIVE 02/19/2021 0603   GLUCOSEU NEGATIVE 11/13/2018 1452   HGBUR SMALL (A) 02/19/2021 0603   BILIRUBINUR NEGATIVE 02/19/2021 0603   KETONESUR 5 (A) 02/19/2021 0603   PROTEINUR 30 (A) 02/19/2021 0603   UROBILINOGEN 0.2 11/13/2018 1452   NITRITE NEGATIVE 02/19/2021 0603   LEUKOCYTESUR NEGATIVE 02/19/2021 0603   Sepsis Labs: @LABRCNTIP (procalcitonin:4,lacticidven:4)  ) Recent Results (from the past 240 hour(s))  Resp Panel by RT-PCR (Flu A&B, Covid) Nasopharyngeal Swab     Status: None   Collection Time: 02/18/21 12:23 AM   Specimen: Nasopharyngeal Swab; Nasopharyngeal(NP) swabs in vial transport medium   Result Value Ref Range Status   SARS Coronavirus 2 by RT PCR NEGATIVE NEGATIVE Final    Comment: (NOTE) SARS-CoV-2 target nucleic acids are NOT DETECTED.  The SARS-CoV-2 RNA is generally detectable in upper respiratory specimens during the acute phase of infection. The lowest concentration of SARS-CoV-2 viral copies this assay can detect is 138 copies/mL. A negative result does not preclude SARS-Cov-2 infection and should not be used as the sole basis for treatment or other patient management decisions. A negative result may occur with  improper specimen collection/handling, submission of specimen other than nasopharyngeal swab, presence of viral mutation(s) within the areas targeted by this assay, and inadequate number of viral copies(<138 copies/mL). A negative result must be combined with clinical observations, patient history, and epidemiological information. The expected result is Negative.  Fact Sheet for Patients:  EntrepreneurPulse.com.au  Fact Sheet for Healthcare Providers:  IncredibleEmployment.be  This test is no t yet approved or cleared by the [place] FDA and  has been authorized for detection and/or diagnosis of SARS-CoV-2 by FDA under an Emergency Use Authorization (EUA). This EUA will remain  in effect (meaning this test can be used) for the duration of the COVID-19 declaration under  Section 564(b)(1) of the Act, 21 U.S.C.section 360bbb-3(b)(1), unless the authorization is terminated  or revoked sooner.       Influenza A by PCR NEGATIVE NEGATIVE Final   Influenza B by PCR NEGATIVE NEGATIVE Final    Comment: (NOTE) The Xpert Xpress SARS-CoV-2/FLU/RSV plus assay is intended as an aid in the diagnosis of influenza from Nasopharyngeal swab specimens and should not be used as a sole basis for treatment. Nasal washings and aspirates are unacceptable for Xpert Xpress SARS-CoV-2/FLU/RSV testing.  Fact Sheet for  Patients: EntrepreneurPulse.com.au  Fact Sheet for Healthcare Providers: IncredibleEmployment.be  This test is not yet approved or cleared by the [place] FDA and has been authorized for detection and/or diagnosis of SARS-CoV-2 by FDA under an Emergency Use Authorization (EUA). This EUA will remain in effect (meaning this test can be used) for the duration of the COVID-19 declaration under Section 564(b)(1) of the Act, 21 U.S.C. section 360bbb-3(b)(1), unless the authorization is terminated or revoked.  Performed at [facility], [address] [place], [place] [address]       Studies: NM Hepato W/EF  Result Date: 02/19/2021 CLINICAL DATA:  Right upper quadrant pain. Assess for cholecystitis. EXAM: NUCLEAR MEDICINE HEPATOBILIARY IMAGING WITH GALLBLADDER EF TECHNIQUE: Sequential images of the abdomen were obtained out to 60 minutes following intravenous administration of radiopharmaceutical. After slow intravenous infusion of 1.77 micrograms Cholecystokinin, gallbladder ejection fraction was determined. RADIOPHARMACEUTICALS:  5.5 mCi Tc-2m  Choletec IV COMPARISON:  Abdominal sonogram 02/17/2021 FINDINGS: Prompt uptake and biliary excretion of activity by the liver is seen. Biliary activity passes into small bowel, consistent with patent common bile duct. After 90 minutes of imaging no gallbladder activity was visualized. The patient received 2.7 mg of morphine the IV infusion. Following the IV administration of morphine the gallbladder was visualized. Calculated gallbladder ejection fraction is 45.7%. (At 60 min, normal ejection fraction is greater than 40%.) IMPRESSION: 1. Patent cystic duct without evidence for acute cholecystitis. 2. Normal gallbladder ejection fraction at 45.7%. Electronically Signed   By: Kerby Moors M.D.   On: 02/19/2021 13:11    Scheduled Meds:  gabapentin  300 mg Oral QHS   multivitamin with minerals   1 tablet Oral Daily   pantoprazole (PROTONIX) IV  40 mg Intravenous Q12H    Continuous Infusions:  lactated ringers 75 mL/hr at 02/19/21 1352     LOS: 0 days     Kayleen Memos, MD [facility] Pager [phone]  If 7PM-7AM, please contact night-coverage [email or web address] Password [email or web address] 02/19/2021, 4:21 PM

## 2021-02-19 NOTE — Plan of Care (Signed)

## 2021-02-19 NOTE — H&P (View-Only) (Signed)
Consultation  Referring Provider:  Dr. Nevada Crane    Primary Care Physician:  [name], MD Primary Gastroenterologist:  Dr. Carlean Purl       Reason for Consultation:  Epigastric Pain            HPI:   Sandra [name] is a 60 y.o. female with a past medical history significant for CAD, endometrial cancer status post TAH (2006), small airway, gastrojejunal marginal ulcer (2016), hypertension, OSA no longer on CPAP and status post gastric bypass who presented to the ED on 02/17/2021 with abdominal pain.  Initially LFTs were thought related to acute cholecystitis but HIDA scan was negative today and surgery has signed off, therefore we were consulted due to ongoing pain.    Today, patient is seen with her daughter and granddaughter by her bedside.  They do assist with history.  Her daughter tells me that she has been complaining of an "ulcer" over the past few weeks at least, "but this wasn't that bad", but Thursday of last week 02/15/2021 patient started with what she thought was a GI bug after her granddaughter had had it earlier in the week with loose bowel movements and nausea.  This seemed to last for about 48 hours and on Saturday morning 10/22 she woke up and started vomiting, later that day around 4 PM after she had had just a few peanut butter crackers she started with an epigastric pain rated as an initial 7-8/10 which worsened over the next 3 hours to the point where she was wanting to go to the ER.  She tried taking one prescription Nexium and Carafate x3 but this didn't help.  Her pain seemed to radiate from her epigastrium over to the right upper side of her abdomen.    Tells me that since admission she really has not eaten at all because pain remains at an 8/10 regardless of pain medicines.  Tells me in fact she had to stop her pain medication at midnight due to HIDA scan scheduled today and had an intense increase in pain, more so now just in her epigastrium.  Also some continued  nausea.  She has not had a bowel movement for at least 2 days.  No vomiting.    Denies fever, chills, weight loss, blood in her stool or symptoms that awaken her from sleep.  ED/Hospital Course: WBC 14,600, CT with sludge and slightly distended gallbladder and slightly indistinct pericholecystic fat, but no evidence for acute cholecystitis on right upper quadrant ultrasound, patient moved from droppage to [facility] in order to have HIDA scan, HIDA scan completed today and negative for acute cholecystitis, white count up to 15.9 today, LFTs normal  GI History: 07/26/2019 colonoscopy Dr. Carlean Purl with one 2 to 3 mm polyp in the descending colon; repeat recommended in 7 years 03/11/2016 EGD Dr. Hilarie Fredrickson in the hospital with normal esophagus, Z-line regular, normal mucosa in the gastric pouch, Roux-en-Y gastrojejunostomy with clean-based anastomotic ulcer ; at that time recommend twice daily PPI capsule that could be opened and sprinkled on food for better absorption  Past Medical History:  Diagnosis Date  . Anxiety   . Arthritis   . CAD (coronary artery disease)    nonobstructive CAD in LAD per 2013 CTA coronary morph  . Depression   . Diabetes mellitus    no meds taken now, checks cbg once or twice a week   . Difficult intubation    had to use the glidescope due to small  airway   . 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 GERD (gastroesophageal reflux disease)   . H/O gastric bypass   . Heart murmur    with last visit, MD states "outgrown" murmur  . Hemorrhoids, internal, with bleeding and Grade 2 prolapse 03/05/2013   All positions seen an anoscopy RP columns banded 03/05/2013 LL and RA banded 03/18/2013     . History of endometrial cancer 2006 ---- S/P ABD. HYSTERECTOMY   STAGE I ENDOMETROID CARCINOMA  . Hx of adenomatous colonic polyps 2011   BENIGN  . Hyperlipidemia    no meds now  . Hypertension   . Sleep apnea    no CPAP since surgery    Past Surgical History:  Procedure  Laterality Date  . ABDOMINAL HYSTERECTOMY  12-20-2004   TAH  . BILATERAL BREAST REDUCTION  11-16-1999  . BREATH TEK H PYLORI N/A 05/27/2013   Procedure: BREATH TEK H PYLORI;  Surgeon: Shann Medal, MD;  Location: [facility];  Service: General;  Laterality: N/A;  . [place]  . COLONOSCOPY    . COLONOSCOPY N/A 04/25/2014   Procedure: COLONOSCOPY;  Surgeon: Gatha Mayer, MD;  Location: [facility];  Service: Endoscopy;  Laterality: N/A;  . COLONOSCOPY WITH PROPOFOL N/A 07/26/2019   Procedure: COLONOSCOPY WITH PROPOFOL;  Surgeon: Gatha Mayer, MD;  Location: [facility];  Service: Endoscopy;  Laterality: N/A;  . CYSTOSCOPY  09/06/2011   Procedure: CYSTOSCOPY;  Surgeon: Ailene Rud, MD;  Location[facility];  Service: Urology;  Laterality: N/A;  LYNX SLING   . ESOPHAGOGASTRODUODENOSCOPY (EGD) WITH PROPOFOL N/A 09/27/2014   Procedure: ESOPHAGOGASTRODUODENOSCOPY (EGD) WITH PROPOFOL;  Surgeon: Gatha Mayer, MD;  Location: [facility];  Service: Endoscopy;  Laterality: N/A;  . ESOPHAGOGASTRODUODENOSCOPY (EGD) WITH PROPOFOL N/A 03/11/2016   Procedure: ESOPHAGOGASTRODUODENOSCOPY (EGD) WITH PROPOFOL;  Surgeon: Jerene Bears, MD;  Location: [facility];  Service: Endoscopy;  Laterality: N/A;  . GASTRIC BYPASS  10/04/2013  . GASTRIC ROUX-EN-Y N/A 10/04/2013   Procedure: LAPAROSCOPIC ROUX-EN-Y GASTRIC BYPASS WITH UPPER ENDOSCOPY;  Surgeon: Shann Medal, MD;  Location: [facility];  Service: General;  Laterality: N/A;  . HEMORRHOID BANDING  02/2013  . POLYPECTOMY    . POLYPECTOMY  07/26/2019   Procedure: POLYPECTOMY;  Surgeon: Gatha Mayer, MD;  Location: [facility];  Service: Endoscopy;;  . PUBOVAGINAL SLING  09/06/2011   Procedure: Gaynelle Arabian;  Surgeon: Ailene Rud, MD;  Location: [facility];  Service: Urology;  Laterality: N/A;  . REDUCTION MAMMAPLASTY Bilateral 2001  . SHOULDER ARTHROSCOPY Right 10/23/2017   Procedure:  RIGHT SHOULDER ARTHROSCOPIC ROTATOR CUFF REPAIR VS. DEBRIDEMENT, SUBACROMIAL DECOMPARESSION, DISTAL CLAVICAL EXCISION;  Surgeon: Tania Ade, MD;  Location: [facility];  Service: Orthopedics;  Laterality: Right;  . UVULOPALATOPHARYNGOPLASTY, TONSILLECTOMY AND SEPTOPLASTY  1994    Family History  Adopted: Yes    Social History   Tobacco Use  . Smoking status: Never  . Smokeless tobacco: Never  Vaping Use  . Vaping Use: Never used  Substance Use Topics  . Alcohol use: No    Alcohol/week: 0.0 standard drinks  . Drug use: No    Prior to Admission medications   Medication Sig Start Date End Date Taking? Authorizing Provider  albuterol (VENTOLIN HFA) 108 (90 Base) MCG/ACT inhaler Inhale 1-2 puffs into the lungs every 4 (four) hours as needed for wheezing or shortness of breath. 01/10/20  Yes Harris, Abigail, PA-C  gabapentin (NEURONTIN) 300  MG capsule TAKE 1 CAPSULE BY MOUTH 3 TIMES DAILY. Patient taking differently: Take 300 mg by mouth at bedtime. 06/19/20 06/19/21 Yes Lyndal Pulley, DO  Multiple Vitamins-Minerals (MULTIVITAMIN PO) Take 2 tablets by mouth daily.    Yes [provider]  ondansetron (ZOFRAN-ODT) 4 MG disintegrating tablet DISSOLVE 1 TABLET BY MOUTH EVERY 8 HOURS AS NEEDED FOR NAUSEA OR VOMITING (MAY TAKE 2 TABLETS IF 1 TABLET IS INEFFECTIVE) Patient taking differently: Take 4-8 mg by mouth every 8 (eight) hours as needed for nausea or vomiting. 06/26/20 06/26/21 Yes Gatha Mayer, MD  glucose blood (FREESTYLE LITE) test strip Use daily as instructed. 01/05/21   [name], MD    Current Facility-Administered Medications  Medication Dose Route Frequency Provider Last Rate Last Admin  . acetaminophen (TYLENOL) tablet 650 mg  650 mg Oral Q6H PRN Karmen Bongo, MD   650 mg at 02/19/21 6295   Or  . acetaminophen (TYLENOL) suppository 650 mg  650 mg Rectal Q6H PRN Karmen Bongo, MD      . albuterol (PROVENTIL) (2.5 MG/3ML) 0.083% nebulizer solution 2.5 mg  2.5  mg Nebulization Q4H PRN Karmen Bongo, MD      . gabapentin (NEURONTIN) capsule 300 mg  300 mg Oral Ivery Quale, MD   300 mg at 02/18/21 2218  . hydrALAZINE (APRESOLINE) injection 10 mg  10 mg Intravenous Q2H PRN Karmen Bongo, MD      . lactated ringers infusion   Intravenous Continuous Karmen Bongo, MD 75 mL/hr at 02/19/21 1352 New Bag at 02/19/21 1352  . morphine 2 MG/ML injection 2 mg  2 mg Intravenous Q2H PRN Karmen Bongo, MD   2 mg at 02/19/21 1337  . multivitamin with minerals tablet 1 tablet  1 tablet Oral Daily Karmen Bongo, MD      . ondansetron (ZOFRAN-ODT) disintegrating tablet 4 mg  4 mg Oral Q6H PRN Karmen Bongo, MD       Or  . ondansetron [facility]) injection 4 mg  4 mg Intravenous Q6H PRN Karmen Bongo, MD      . oxyCODONE (Oxy IR/ROXICODONE) immediate release tablet 5-10 mg  5-10 mg Oral Q4H PRN Karmen Bongo, MD   10 mg at 02/18/21 1957    Allergies as of 02/17/2021 - Review Complete 02/17/2021  Allergen Reaction Noted  . Nsaids  09/06/2016  . Statins  11/14/2018     Review of Systems:    Constitutional: No weight loss, fever or chills Skin: No rash  Cardiovascular: No chest pain Respiratory: No SOB  Gastrointestinal: See HPI and otherwise negative Genitourinary: No dysuria Neurological: No headache, dizziness or syncope Musculoskeletal: No new muscle or joint pain Hematologic: No bleeding  Psychiatric: No history of depression or anxiety    Physical Exam:  Vital signs in last 24 hours: Temp:  [98.7 F (37.1 C)-99.3 F (37.4 C)] 98.9 F (37.2 C) (10/24 1326) Pulse Rate:  [101-108] 101 (10/24 1326) Resp:  [17-20] 18 (10/24 1326) BP: (122-151)/(73-83) 122/73 (10/24 1326) SpO2:  [94 %-96 %] 94 % (10/24 1326) Last BM Date: 02/16/21 General:   Pleasant Caucasian female appears to be in NAD, Well developed, Well nourished, alert and cooperative Head:  Normocephalic and atraumatic. Eyes:   PEERL, EOMI. No icterus. Conjunctiva  pink. Ears:  Normal auditory acuity. Neck:  Supple Throat: Oral cavity and pharynx without inflammation, swelling or lesion.  Lungs: Respirations even and unlabored. Lungs clear to auscultation bilaterally.   No wheezes, crackles, or rhonchi.  Heart: Normal  S1, S2. No MRG. Regular rate and rhythm. No peripheral edema, cyanosis or pallor.  Abdomen:  Soft, nondistended, marked epigastric TTP with some involuntary guarding, also tender in the right lower quadrant. Normal bowel sounds. No appreciable masses or hepatomegaly. Rectal:  Not performed.  Msk:  Symmetrical without gross deformities. Peripheral pulses intact.  Extremities:  Without edema, no deformity or joint abnormality.  Neurologic:  Alert and  oriented x4;  grossly normal neurologically.  Skin:   Dry and intact without significant lesions or rashes. Psychiatric: Demonstrates good judgement and reason without abnormal affect or behaviors.   LAB RESULTS: Recent Labs    02/17/21 2052 02/19/21 0219  WBC 14.6* 15.9*  HGB 14.6 14.1  HCT 43.8 44.1  PLT 270 218   BMET Recent Labs    02/17/21 2052 02/19/21 0219  NA 141 135  K 3.9 4.0  CL 102 100  CO2 25 26  GLUCOSE 174* 163*  BUN 24* 11  CREATININE 0.65 0.63  CALCIUM 10.0 9.2   LFT Recent Labs    02/17/21 2052  PROT 7.3  ALBUMIN 4.9  AST 16  ALT 17  ALKPHOS 49  BILITOT 0.3    STUDIES: CT Abdomen Pelvis W Contrast  Result Date: 02/17/2021 CLINICAL DATA:  Bowel obstruction suspected Abdominal pain.  Episode of vomiting. EXAM: CT ABDOMEN AND PELVIS WITH CONTRAST TECHNIQUE: Multidetector CT imaging of the abdomen and pelvis was performed using the standard protocol following bolus administration of intravenous contrast. CONTRAST:  162mL OMNIPAQUE IOHEXOL 300 MG/ML  SOLN COMPARISON:  Noncontrast CT 08/19/2014 FINDINGS: Lower chest: No acute airspace disease or pleural effusion. Heart size normal. Hepatobiliary: No focal hepatic lesion. Layering stones or sludge in  the gallbladder which is mildly distended. There is slight indistinctness of the pericholecystic fat. No visualized choledocholithiasis. Upper normal common bile duct measuring 7 mm at the porta hepatis. Pancreas: No ductal dilatation or inflammation. Single calcification in the pancreatic body. Spleen: Normal in size. Lobulated hypodensity in the periphery of the inferior spleen, likely a hemangioma. Adrenals/Urinary Tract: Slight adrenal thickening without dominant nodule. Small nonobstructing stone in the lower right kidney. Punctate nonobstructing stone in the mid lower left kidney. No hydronephrosis. Homogeneous renal enhancement. Symmetric excretion on delayed phase imaging. Tiny cortical hypodensities in both kidneys are too small to characterize but likely small cyst. Partially distended urinary bladder with wall thickening at the dome. Stomach/Bowel: Mild wall thickening of the distal esophagus. Gastric bypass anatomy. Minimal fluid in the excluded gastric remnant. The Roux limb is nondilated, but redundant. Jejunal anastomosis is minimally patulous. There is no bowel obstruction, inflammation, pneumatosis or ischemia. Few fluid-filled loops of small bowel in the pelvis. Normal appendix. Small volume of formed stool in the colon. The sigmoid colon is redundant. No colonic inflammation. Vascular/Lymphatic: Mild aortic atherosclerosis. No aortic aneurysm. Patent portal vein. No enlarged lymph nodes in the abdomen or pelvis. Reproductive: Hysterectomy.  No adnexal mass. Other: No free air, free fluid or focal fluid collection. No abdominal wall hernia. Musculoskeletal: Lower lumbar facet hypertrophy. Grade 1 anterolisthesis of L4 on L5 is likely facet mediated. There are no acute or suspicious osseous abnormalities. Incidental bone island in the pelvis. IMPRESSION: 1. Gastric bypass anatomy without bowel obstruction or inflammation. The Roux limb is redundant. 2. Layering stones or sludge in the gallbladder  which is mildly distended. There is slight indistinctness of the pericholecystic fat, which can be seen with acute cholecystitis in the appropriate clinical setting. Recommend right upper quadrant ultrasound for further  evaluation. 3. Bilateral nonobstructing renal calculi. 4. Mild wall thickening of the distal esophagus, can be seen with reflux or esophagitis. 5. Urinary bladder wall thickening at the dome, recommend correlation with urinalysis to exclude urinary tract infection. Aortic Atherosclerosis (ICD10-I70.0). Electronically Signed   By: Keith Rake M.D.   On: 02/17/2021 21:51   NM Hepato W/EF  Result Date: 02/19/2021 CLINICAL DATA:  Right upper quadrant pain. Assess for cholecystitis. EXAM: NUCLEAR MEDICINE HEPATOBILIARY IMAGING WITH GALLBLADDER EF TECHNIQUE: Sequential images of the abdomen were obtained out to 60 minutes following intravenous administration of radiopharmaceutical. After slow intravenous infusion of 1.77 micrograms Cholecystokinin, gallbladder ejection fraction was determined. RADIOPHARMACEUTICALS:  5.5 mCi Tc-23m  Choletec IV COMPARISON:  Abdominal sonogram 02/17/2021 FINDINGS: Prompt uptake and biliary excretion of activity by the liver is seen. Biliary activity passes into small bowel, consistent with patent common bile duct. After 90 minutes of imaging no gallbladder activity was visualized. The patient received 2.7 mg of morphine the IV infusion. Following the IV administration of morphine the gallbladder was visualized. Calculated gallbladder ejection fraction is 45.7%. (At 60 min, normal ejection fraction is greater than 40%.) IMPRESSION: 1. Patent cystic duct without evidence for acute cholecystitis. 2. Normal gallbladder ejection fraction at 45.7%. Electronically Signed   By: Kerby Moors M.D.   On: 02/19/2021 13:11   US Abdomen Limited RUQ (LIVER/GB)  Result Date: 02/17/2021 CLINICAL DATA:  Right upper quadrant pain and right flank pain. EXAM: ULTRASOUND ABDOMEN  LIMITED RIGHT UPPER QUADRANT COMPARISON:  Abdomen and pelvis CT, dated February 17, 2021 FINDINGS: Gallbladder: Echogenic sludge and shadowing echogenic gallstones are seen within the distended gallbladder. The largest measures approximately 1.1 cm x 0.6 cm x 0.7 cm. A 0.5 cm x 0.5 cm x 0.6 cm gallstone is seen within the neck of the gallbladder. There is no evidence of gallbladder wall thickening (2.1 mm). No sonographic Murphy sign noted by sonographer. Common bile duct: Diameter: 6.6 mm Liver: No focal lesion identified. Within normal limits in parenchymal echogenicity. Portal vein is patent on color Doppler imaging with normal direction of blood flow towards the liver. Other: None. IMPRESSION: Cholelithiasis and gallbladder sludge, without evidence of acute cholecystitis. Further evaluation with a nuclear medicine hepatobiliary scan is recommended if this remains of clinical concern. Electronically Signed   By: Virgina Norfolk M.D.   On: 02/17/2021 22:58      Impression / Plan:  Impression: 1.  Epigastric pain: Initially assumed gallbladder origin, ultrasound with cholelithiasis and gallbladder sludge, no evidence for acute cholecystitis, HIDA scan negative today, previous history of anastomotic ulcers, patient only on prn Nexium (unsure of dose) and prn Carafate (which she hadn't been using; consider ulcer+/- episodic symptomatic cholelithiasis 2.  Leukocytosis: Increased today 14.6--> 15.9; likely with above 3.  History of gastric bypass: With previous anastomotic ulcers in 2017  Plan: 1.  Scheduled patient for EGD tomorrow 02/20/2021 with Dr. Candis Schatz.  Did discuss risks, benefits, limitations and alternatives and patient agrees to proceed. 2.  Patient can be on her regular diet today as tolerated and n.p.o. at midnight 3.  Patient was not on a PPI.  Started IV Pantoprazole 40 mg twice daily.  Discussed that this is likely something she should remain on chronically given her hx of ulcers in the  past 4.  Continue as needed analgesics and antiemetics  Thank you for your kind consultation, we will continue to follow.  Lavone Nian Lemmon  02/19/2021, 3:47 PM   I have taken a history, reviewed  the chart and examined the patient. I performed a substantive portion of this encounter, including complete performance of at least one of the key components, in conjunction with the APP. I agree with the APP's note, impression and recommendations  60 year old female with history of gastric bypass complicated by marginal ulcers, admitted with severe abdominal pain, with associated nausea and vomiting, initially thought to be related to gallstones, but subsequent HIDA normal.  CT abdomen/pelvis also with bladder wall thickening and a UA suggestive of UTI, culture pending.  Patient states that her abdominal pain does not seem very similar to her previous ulcer pain, but I agree that an EGD is warranted to exclude this as a cause.    Will plan for EGD tomorrow Diet as tolerated tonight then NPO after midnight PPI BID today   Ahmere Hemenway E. Candis Schatz, MD [facility]

## 2021-02-20 ENCOUNTER — Other Ambulatory Visit (HOSPITAL_COMMUNITY): Payer: Self-pay

## 2021-02-20 ENCOUNTER — Encounter (HOSPITAL_COMMUNITY)
Admission: EM | Disposition: A | Discharge status: Home / Self Care | Payer: Self-pay | Source: Home / Self Care | Attending: Internal Medicine

## 2021-02-20 ENCOUNTER — Inpatient Hospital Stay (HOSPITAL_COMMUNITY): Payer: 59 | Admitting: Anesthesiology

## 2021-02-20 ENCOUNTER — Encounter (HOSPITAL_COMMUNITY): Payer: Self-pay | Admitting: Internal Medicine

## 2021-02-20 DIAGNOSIS — K289 Gastrojejunal ulcer, unspecified as acute or chronic, without hemorrhage or perforation: Principal | ICD-10-CM

## 2021-02-20 DIAGNOSIS — R1084 Generalized abdominal pain: Secondary | ICD-10-CM | POA: Diagnosis not present

## 2021-02-20 HISTORY — PX: ESOPHAGOGASTRODUODENOSCOPY (EGD) WITH PROPOFOL: SHX5813

## 2021-02-20 HISTORY — PX: BIOPSY: SHX5522

## 2021-02-20 LAB — HEMOGLOBIN A1C
Hgb A1c MFr Bld: 6.4 % — ABNORMAL HIGH (ref 4.8–5.6)
Mean Plasma Glucose: 136.98 mg/dL

## 2021-02-20 LAB — CBC
HCT: 40.5 % (ref 36.0–46.0)
Hemoglobin: 13 g/dL (ref 12.0–15.0)
MCH: 31 pg (ref 26.0–34.0)
MCHC: 32.1 g/dL (ref 30.0–36.0)
MCV: 96.4 fL (ref 80.0–100.0)
Platelets: 192 10*3/uL (ref 150–400)
RBC: 4.2 MIL/uL (ref 3.87–5.11)
RDW: 13 % (ref 11.5–15.5)
WBC: 11.1 10*3/uL — ABNORMAL HIGH (ref 4.0–10.5)
nRBC: 0 % (ref 0.0–0.2)

## 2021-02-20 LAB — MAGNESIUM: Magnesium: 1.7 mg/dL (ref 1.7–2.4)

## 2021-02-20 LAB — COMPREHENSIVE METABOLIC PANEL
ALT: 24 U/L (ref 0–44)
AST: 19 U/L (ref 15–41)
Albumin: 2.6 g/dL — ABNORMAL LOW (ref 3.5–5.0)
Alkaline Phosphatase: 60 U/L (ref 38–126)
Anion gap: 7 (ref 5–15)
BUN: 15 mg/dL (ref 8–23)
CO2: 30 mmol/L (ref 22–32)
Calcium: 8.9 mg/dL (ref 8.9–10.3)
Chloride: 97 mmol/L — ABNORMAL LOW (ref 98–111)
Creatinine, Ser: 0.57 mg/dL (ref 0.44–1.00)
GFR, Estimated: >60 mL/min (ref >60)
Glucose, Bld: 144 mg/dL — ABNORMAL HIGH (ref 70–99)
Potassium: 3.7 mmol/L (ref 3.5–5.1)
Sodium: 134 mmol/L — ABNORMAL LOW (ref 135–145)
Total Bilirubin: 0.9 mg/dL (ref 0.3–1.2)
Total Protein: 5.9 g/dL — ABNORMAL LOW (ref 6.5–8.1)

## 2021-02-20 LAB — URINE CULTURE

## 2021-02-20 LAB — PHOSPHORUS: Phosphorus: 1.4 mg/dL — ABNORMAL LOW (ref 2.5–4.6)

## 2021-02-20 LAB — GLUCOSE, CAPILLARY
Glucose-Capillary: 127 mg/dL — ABNORMAL HIGH (ref 70–99)
Glucose-Capillary: 120 mg/dL — ABNORMAL HIGH (ref 70–99)

## 2021-02-20 SURGERY — ESOPHAGOGASTRODUODENOSCOPY (EGD) WITH PROPOFOL
Anesthesia: Monitor Anesthesia Care | Laterality: Left

## 2021-02-20 MED ORDER — OXYCODONE HCL 5 MG PO TABS
5.0000 mg | ORAL_TABLET | Freq: Two times a day (BID) | ORAL | 0 refills | Status: AC | PRN
  Filled 2021-02-20: qty 10, 5d supply, fill #0

## 2021-02-20 MED ORDER — POLYETHYLENE GLYCOL 3350 17 GM/SCOOP PO POWD
17.0000 g | Freq: Every day | ORAL | 0 refills | Status: AC
  Filled 2021-02-20: qty 238, 14d supply, fill #0

## 2021-02-20 MED ORDER — SODIUM PHOSPHATES 45 MMOLE/15ML IV SOLN: 30.0000 mmol | INTRAVENOUS | Status: DC

## 2021-02-20 MED ORDER — PROPOFOL 500 MG/50ML IV EMUL
INTRAVENOUS | Status: DC | PRN
  Administered 2021-02-20: 125 ug/kg/min via INTRAVENOUS

## 2021-02-20 MED ORDER — SENNOSIDES-DOCUSATE SODIUM 8.6-50 MG PO TABS: 2.0000 | ORAL_TABLET | Freq: Every day | ORAL | Status: DC

## 2021-02-20 MED ORDER — SUCRALFATE 1 G PO TABS
1.0000 g | ORAL_TABLET | Freq: Three times a day (TID) | ORAL | 0 refills | Status: DC
  Filled 2021-02-20: qty 56, 14d supply, fill #0

## 2021-02-20 MED ORDER — LIDOCAINE 2% (20 MG/ML) 5 ML SYRINGE
INTRAMUSCULAR | Status: DC | PRN
  Administered 2021-02-20: 60 mg via INTRAVENOUS

## 2021-02-20 MED ORDER — PANTOPRAZOLE SODIUM 40 MG PO TBEC
40.0000 mg | DELAYED_RELEASE_TABLET | Freq: Every day | ORAL | 0 refills | Status: DC
  Filled 2021-02-20: qty 90, 90d supply, fill #0

## 2021-02-20 MED ORDER — K PHOS MONO-SOD PHOS DI & MONO 155-852-130 MG PO TABS
500.0000 mg | ORAL_TABLET | Freq: Every day | ORAL | 0 refills | Status: AC
  Filled 2021-02-20: qty 10, 5d supply, fill #0

## 2021-02-20 MED ORDER — SUCRALFATE 1 GM/10ML PO SUSP
1.0000 g | Freq: Three times a day (TID) | ORAL | Status: DC
  Filled 2021-02-20 (×2): qty 10

## 2021-02-20 MED ORDER — HYDROMORPHONE HCL 1 MG/ML IJ SOLN
0.5000 mg | INTRAMUSCULAR | Status: DC
Start: 2021-02-20 — End: 2021-02-20

## 2021-02-20 MED ORDER — K PHOS MONO-SOD PHOS DI & MONO 155-852-130 MG PO TABS
500.0000 mg | ORAL_TABLET | Freq: Every day | ORAL | Status: DC
  Administered 2021-02-20: 500 mg via ORAL
  Filled 2021-02-20: qty 2

## 2021-02-20 MED ORDER — PANTOPRAZOLE SODIUM 40 MG PO TBEC
40.0000 mg | DELAYED_RELEASE_TABLET | Freq: Two times a day (BID) | ORAL | 0 refills | Status: DC
  Filled 2021-02-20: qty 112, 56d supply, fill #0

## 2021-02-20 MED ORDER — PANTOPRAZOLE SODIUM 40 MG PO TBEC
40.0000 mg | DELAYED_RELEASE_TABLET | Freq: Two times a day (BID) | ORAL | Status: DC
  Administered 2021-02-20: 40 mg via ORAL
  Filled 2021-02-20: qty 1

## 2021-02-20 MED ORDER — HYDROMORPHONE HCL 1 MG/ML IJ SOLN
0.5000 mg | INTRAMUSCULAR | Status: DC | PRN
Start: 2021-02-20 — End: 2021-02-20

## 2021-02-20 MED ORDER — PROPOFOL 10 MG/ML IV BOLUS
INTRAVENOUS | Status: DC | PRN
  Administered 2021-02-20 (×3): 20 mg via INTRAVENOUS

## 2021-02-20 NOTE — Plan of Care (Signed)
  Problem: Education: Goal: Knowledge of General Education information will improve Description: Including pain rating scale, medication(s)/side effects and non-pharmacologic comfort measures Outcome: Adequate for Discharge   

## 2021-02-20 NOTE — Interval H&P Note (Signed)
History and Physical Interval Note:  No changes to patient's symptoms or medical history overnight.  Continues to have pain.  No vomiting or diarrhea.  EGD today to rule out marginal ulcer or other intra-luminal etiologies for her symptoms.  02/20/2021 9:37 AM  Sandra [name]  has presented today for surgery, with the diagnosis of Epigastric pain.  The various methods of treatment have been discussed with the patient and family. After consideration of risks, benefits and other options for treatment, the patient has consented to  Procedure(s): ESOPHAGOGASTRODUODENOSCOPY (EGD) WITH PROPOFOL (Left) as a surgical intervention.  The patient's history has been reviewed, patient examined, no change in status, stable for surgery.  I have reviewed the patient's chart and labs.  Questions were answered to the patient's satisfaction.     Daryel November

## 2021-02-20 NOTE — Anesthesia Preprocedure Evaluation (Addendum)
Anesthesia Evaluation  Patient identified by MRN, date of birth, ID band Patient awake    Reviewed: Allergy & Precautions, NPO status , Patient's Chart, lab work & pertinent test results  History of Anesthesia Complications Negative for: history of anesthetic complications  Airway Mallampati: III  TM Distance: >3 FB Neck ROM: Full    Dental no notable dental hx. (+) Dental Advisory Given   Pulmonary sleep apnea and Continuous Positive Airway Pressure Ventilation ,    Pulmonary exam normal        Cardiovascular hypertension, + CAD   Rhythm:Regular Rate:Normal + Systolic murmurs    Neuro/Psych PSYCHIATRIC DISORDERS Anxiety Depression    GI/Hepatic Neg liver ROS, PUD, GERD  Medicated,S/P gastric bypass   Endo/Other  diabetes  Renal/GU negative Renal ROS     Musculoskeletal  (+) Arthritis ,   Abdominal Normal abdominal exam  (+)   Peds  Hematology negative hematology ROS (+)   Anesthesia Other Findings   Reproductive/Obstetrics                            Anesthesia Physical  Anesthesia Plan  ASA: 2  Anesthesia Plan: MAC   Post-op Pain Management:    Induction: Intravenous  PONV Risk Score and Plan: 2 and Propofol infusion and Treatment may vary due to age or medical condition  Airway Management Planned: Natural Airway and Simple Face Mask  Additional Equipment: None  Intra-op Plan:   Post-operative Plan:   Informed Consent: I have reviewed the patients History and Physical, chart, labs and discussed the procedure including the risks, benefits and alternatives for the proposed anesthesia with the patient or authorized representative who has indicated his/her understanding and acceptance.     Dental advisory given  Plan Discussed with: Anesthesiologist and CRNA  Anesthesia Plan Comments:        Anesthesia Quick Evaluation

## 2021-02-20 NOTE — Anesthesia Procedure Notes (Signed)
Procedure Name: MAC Date/Time: 02/20/2021 11:23 AM Performed by: Mariea Clonts, CRNA Pre-anesthesia Checklist: Patient identified, Emergency Drugs available, Suction available, Patient being monitored and Timeout performed

## 2021-02-20 NOTE — Transfer of Care (Signed)
Immediate Anesthesia Transfer of Care Note  Patient: Sandra [name]  Procedure(s) Performed: ESOPHAGOGASTRODUODENOSCOPY (EGD) WITH PROPOFOL (Left)  Patient Location: PACU  Anesthesia Type:MAC  Level of Consciousness: awake, alert  and oriented  Airway & Oxygen Therapy: Patient Spontanous Breathing and Patient connected to nasal cannula oxygen  Post-op Assessment: Report given to RN and Post -op Vital signs reviewed and stable  Post vital signs: Reviewed and stable  Last Vitals:  Vitals Value Taken Time  BP    Temp    Pulse 103 02/20/21 1147  Resp 16 02/20/21 1147  SpO2 98 % 02/20/21 1147  Vitals shown include unvalidated device data.  Last Pain:  Vitals:   02/20/21 1045  TempSrc: Temporal  PainSc: 0-No pain      Patients Stated Pain Goal: 2 (49/17/91 5056)  Complications: No notable events documented.

## 2021-02-20 NOTE — Op Note (Signed)
[facility] Patient Name: Sandra [name] Procedure Date : 02/20/2021 MRN: [id number] Attending MD: Gladstone Pih. Candis Schatz , MD Date of Birth: 1960-08-15 CSN: [id number] Age: 60 Admit Type: Inpatient Procedure:                Upper GI endoscopy Indications:              Generalized abdominal pain Providers:                Nicki Reaper E. Candis Schatz, MD, Jeanella Cara, RN,                            Frazier Richards, Technician, Cherylynn Ridges, Technician Referring MD:              Medicines:                Monitored Anesthesia Care Complications:            No immediate complications. Estimated Blood Loss:     Estimated blood loss was minimal. Procedure:                Pre-Anesthesia Assessment:                           - Prior to the procedure, a History and Physical                            was performed, and patient medications and                            allergies were reviewed. The patient's tolerance of                            previous anesthesia was also reviewed. The risks                            and benefits of the procedure and the sedation                            options and risks were discussed with the patient.                            All questions were answered, and informed consent                            was obtained. Prior Anticoagulants: The patient has                            taken no previous anticoagulant or antiplatelet                            agents. ASA Grade Assessment: II - A patient with                            mild systemic disease. After reviewing the risks  and benefits, the patient was deemed in                            satisfactory condition to undergo the procedure.                           After obtaining informed consent, the endoscope was                            passed under direct vision. Throughout the                            procedure, the patient's blood pressure, pulse, and                             oxygen saturations were monitored continuously. The                            GIF-H190 ([id number] Olympus endoscope was introduced                            through the mouth, and advanced to the efferent                            jejunal loop. The upper GI endoscopy was                            accomplished without difficulty. The patient                            tolerated the procedure well. Scope In: Scope Out: Findings:      The examined portions of the nasopharynx, oropharynx and larynx were       normal.      The examined esophagus was normal.      Normal mucosa was found in the entire examined stomach.      Evidence of a gastric bypass was found. A gastric pouch with a large       size was found. The pouch was 8 cm in length. The gastrojejunal       anastomosis was characterized by healthy appearing mucosa. This was       traversed. The blind limb of the jejunum was elongated at about 10 cm in       length. The mucosa appeared normal.      Two non-bleeding cratered ulcers with no stigmata of bleeding were found       just distal to the gastrojejunal anastomosis. The largest lesion was 7       mm in largest dimension. Biopsies were taken with a cold forceps for       Helicobacter pylori testing. Estimated blood loss was minimal.      Exam of the jejunum was otherwise normal. Impression:               - The examined portions of the nasopharynx,                            oropharynx and  larynx were normal.                           - Normal esophagus.                           - Normal mucosa was found in the entire stomach.                           - Gastric bypass with a large-sized pouch.                            Gastrojejunal anastomosis characterized by healthy                            appearing mucosa.                           - Non-bleeding marginal ulcers with no stigmata of                            bleeding. Biopsied. This is the  source of the                            patient's abdominal pain. Recommendation:           - Return patient to hospital ward for possible                            discharge same day.                           - Advance diet as tolerated.                           - Use a proton pump inhibitor PO BID for 8 weeks,                            then continue on once daily PPI indefinitely                           - Use sucralfate suspension 1 gram PO QID for 2                            weeks.                           - Await pathology results.                           - Repeat upper endoscopy in 8 weeks to check                            healing.                           - Continue to avoid all  NSAIDs indefinitely.                           - GI will sign off for now.                           - Our office will contact her to arrange outpatient                            endoscopy. Procedure Code(s):        --- Professional ---                           [phone], Esophagogastroduodenoscopy, flexible,                            transoral; with biopsy, single or multiple Diagnosis Code(s):        --- Professional ---                           Z98.84, Bariatric surgery status                           K28.9, Gastrojejunal ulcer, unspecified as acute or                            chronic, without hemorrhage or perforation                           R10.84, Generalized abdominal pain CPT copyright 2019 [employer]. All rights reserved. The codes documented in this report are preliminary and upon coder review may  be revised to meet current compliance requirements. Jeilyn Reznik E. Candis Schatz, MD 02/20/2021 11:54:25 AM This report has been signed electronically. Number of Addenda: 0

## 2021-02-20 NOTE — Anesthesia Postprocedure Evaluation (Signed)
Anesthesia Post Note  Patient: Thursa Emme  Procedure(s) Performed: ESOPHAGOGASTRODUODENOSCOPY (EGD) WITH PROPOFOL (Left)     Patient location during evaluation: PACU Anesthesia Type: MAC Level of consciousness: awake and alert Pain management: pain level controlled Vital Signs Assessment: post-procedure vital signs reviewed and stable Respiratory status: spontaneous breathing, nonlabored ventilation, respiratory function stable and patient connected to nasal cannula oxygen Cardiovascular status: stable and blood pressure returned to baseline Postop Assessment: no apparent nausea or vomiting Anesthetic complications: no   No notable events documented.  Last Vitals:  Vitals:   02/20/21 1215 02/20/21 1229  BP:  112/66  Pulse: 96 96  Resp: 18 18  Temp:  37.3 C  SpO2: 95% 94%    Last Pain:  Vitals:   02/20/21 1229  TempSrc: Oral  PainSc:                  Catalina Gravel

## 2021-02-20 NOTE — Progress Notes (Signed)
Discharge instructions (including medications) discussed with and copy provided to patient/caregiver 

## 2021-02-20 NOTE — Discharge Summary (Addendum)
Discharge Summary  Sandra [name] IDP:[id number] DOB: Oct 05, 1960  PCP: [name], MD  Admit date: 02/17/2021 Discharge date: 02/20/2021  Time spent: 35 minutes.  Recommendations for Outpatient Follow-up:  Follow-up with GI in 8 weeks. 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    Discharge Condition: Stable  Diet recommendation: Resume previous diet, carb modified diet.  Vitals:   02/20/21 1215 02/20/21 1229  BP:  112/66  Pulse: 96 96  Resp: 18 18  Temp:  99.1 F (37.3 C)  SpO2: 95% 94%    History of present illness:  Sandra [name] is a 60 y.o. female with medical history significant of CAD; endometrial CA s/p TAH (2006); DM;  gastrojejunal marginal ulcer (2016); HTN; HLD; OSA no longer on CPAP; and s/p gastric bypass who presented to [facility] ED with complaints of severe abdominal pain.  Associated with nausea and vomiting x1 day.  CT abdomen and pelvis with contrast showed:  1. Gastric bypass anatomy without bowel obstruction or inflammation. The Roux limb is redundant. 2. Layering stones or sludge in the gallbladder which is mildly distended. There is slight indistinctness of the pericholecystic fat, which can be seen with acute cholecystitis in the appropriate clinical setting. Recommend right upper quadrant ultrasound for further evaluation. 3. Bilateral nonobstructing renal calculi. 4. Mild wall thickening of the distal esophagus, can be seen with reflux or esophagitis. 5. Urinary bladder wall thickening at the dome, recommend correlation with urinalysis to exclude urinary tract infection.  HIDA scan done on 02/19/2021 was negative for evidence of acute cholecystitis, general surgery  signed off.  GI consulted, post EGD, on 02/20/2021 by Dr. Candis Schatz, which showed 2 marginal ulcers.  Was started on PPI Protonix 40 mg twice daily x8 weeks then daily alone indefinitely.  Also started on Carafate suspension 4 times daily x2 weeks.  Will follow-up with GI outpatient for repeat EGD in 8 weeks.  Okay to discharge to home from a GI standpoint.   02/20/2021: Seen at her bedside.  Reported epigastric pain improved with narcotic pain medication.  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 Seen by GI, post EGD on 02/20/2021 by Dr. Candis Schatz EGD  showed 2 marginal ulcers.  Was started on PPI Protonix 40 mg twice daily x8 weeks then daily alone indefinitely.  Also started on Carafate suspension 4 times daily x2 weeks.  Will follow-up with GI outpatient for repeat EGD in 8 weeks.  Follow-up with GI in 8 weeks.   Resolving leukocytosis, suspect reactive in the setting of marginal ulcers. UA, many bacteria, urine culture grew multiple species with suggestion for recollection.. She received a dose of Rocephin on 02/19/2021.   Type 2 diabetes with hyperglycemia Last hemoglobin A1c 7.1 on 10/21/2019 Hemoglobin A1c 6.4 on 02/20/2021 Not on hypoglycemics medications prior to admission. Will need to follow-up with her primary care provider posthospitalization.  Hypophosphatemia Serum phosphorus level 1.4 Repleted orally Follow-up with your PCP.     Code Status: Full code   Family Communication: None at bedside   Disposition  Plan: Likely will discharge to home once GI signs of     Consultants: GI   Procedures: None   Antimicrobials: Rocephin, started on 02/20/2024, DC'd on 02/20/2021.    Discharge Exam: BP 112/66 (BP  Location: Right Arm)   Pulse 96   Temp 99.1 F (37.3 C) (Oral)   Resp 18   Ht 5\' 2"  (1.575 m)   Wt 66.7 kg   SpO2 94%   BMI 26.89 kg/m  General: 60 y.o. year-old female well developed well nourished in no acute distress.  Alert and oriented x3. Cardiovascular: Regular rate and rhythm with no rubs or gallops.  No thyromegaly or JVD noted.   Respiratory: Clear to auscultation with no wheezes or rales. Good inspiratory effort. Abdomen: Soft, mildly tender epigastric region.  Nondistended with normal bowel sounds x4 quadrants. Musculoskeletal: No lower extremity edema. 2/4 pulses in all 4 extremities. Skin: No ulcerative lesions noted or rashes, Psychiatry: Mood is appropriate for condition and setting  Discharge Instructions You were cared for by a hospitalist during your hospital stay. If you have any questions about your discharge medications or the care you received while you were in the hospital after you are discharged, you can call the unit and asked to speak with the hospitalist on call if the hospitalist that took care of you is not available. Once you are discharged, your primary care physician will handle any further medical issues. Please note that NO REFILLS for any discharge medications will be authorized once you are discharged, as it is imperative that you return to your primary care physician (or establish a relationship with a primary care physician if you do not have one) for your aftercare needs so that they can reassess your need for medications and monitor your lab values.   Allergies as of 02/20/2021       Reactions   Nsaids    r/t gastric surgery   Statins         Medication List     TAKE these medications    albuterol 108 (90 Base) MCG/ACT inhaler Commonly known as: VENTOLIN HFA Inhale 1-2 puffs into the lungs every 4 (four) hours as needed for wheezing or shortness of breath.   FREESTYLE LITE test strip Generic drug: glucose blood Use daily as  instructed.   gabapentin 300 MG capsule Commonly known as: NEURONTIN TAKE 1 CAPSULE BY MOUTH 3 TIMES DAILY. What changed:  how much to take when to take this   MULTIVITAMIN PO Take 2 tablets by mouth daily.   ondansetron 4 MG disintegrating tablet Commonly known as: ZOFRAN-ODT DISSOLVE 1 TABLET BY MOUTH EVERY 8 HOURS AS NEEDED FOR NAUSEA OR VOMITING (MAY TAKE 2 TABLETS IF 1 TABLET IS INEFFECTIVE) What changed:  how much to take how to take this when to take this reasons to take this   oxyCODONE 5 MG immediate release tablet Commonly known as: Oxy IR/ROXICODONE Take 1 tablet (5 mg total) by mouth 2 (two) times daily as needed for up to 5 days for severe pain.   pantoprazole 40 MG tablet Commonly known as: PROTONIX Take 1 tablet (40 mg total) by mouth 2 (two) times daily.   pantoprazole 40 MG tablet Commonly known as: Protonix Take 1 tablet (40 mg total) by mouth daily. Start taking on: April 18, 2021   phosphorus [phone] MG tablet Commonly known as: K PHOS NEUTRAL Take 2 tablets (500 mg total) by mouth daily for 5 days.   polyethylene glycol powder 17 GM/SCOOP  powder Commonly known as: MiraLax Take 17 g by mouth daily.   sucralfate 1 g tablet Commonly known as: CARAFATE Take 1 tablet (1 g total) by mouth 4 (four) times daily -  with meals and at bedtime for 14 days.       Allergies  Allergen Reactions   Nsaids     r/t gastric surgery   Statins     Follow-up Information     [name], MD Follow up.   Specialty: Internal Medicine Contact information: [place] [address] [phone]         Gatha Mayer, MD. Call in 1 day(s).   Specialty: Gastroenterology Why: Please call for a posthospital follow-up appointment. Contact information: [address] [place] [address] [phone]                  The results of significant diagnostics from this hospitalization (including imaging, microbiology,  ancillary and laboratory) are listed below for reference.    Significant Diagnostic Studies: CT Abdomen Pelvis W Contrast  Result Date: 02/17/2021 CLINICAL DATA:  Bowel obstruction suspected Abdominal pain.  Episode of vomiting. EXAM: CT ABDOMEN AND PELVIS WITH CONTRAST TECHNIQUE: Multidetector CT imaging of the abdomen and pelvis was performed using the standard protocol following bolus administration of intravenous contrast. CONTRAST:  117mL OMNIPAQUE IOHEXOL 300 MG/ML  SOLN COMPARISON:  Noncontrast CT 08/19/2014 FINDINGS: Lower chest: No acute airspace disease or pleural effusion. Heart size normal. Hepatobiliary: No focal hepatic lesion. Layering stones or sludge in the gallbladder which is mildly distended. There is slight indistinctness of the pericholecystic fat. No visualized choledocholithiasis. Upper normal common bile duct measuring 7 mm at the porta hepatis. Pancreas: No ductal dilatation or inflammation. Single calcification in the pancreatic body. Spleen: Normal in size. Lobulated hypodensity in the periphery of the inferior spleen, likely a hemangioma. Adrenals/Urinary Tract: Slight adrenal thickening without dominant nodule. Small nonobstructing stone in the lower right kidney. Punctate nonobstructing stone in the mid lower left kidney. No hydronephrosis. Homogeneous renal enhancement. Symmetric excretion on delayed phase imaging. Tiny cortical hypodensities in both kidneys are too small to characterize but likely small cyst. Partially distended urinary bladder with wall thickening at the dome. Stomach/Bowel: Mild wall thickening of the distal esophagus. Gastric bypass anatomy. Minimal fluid in the excluded gastric remnant. The Roux limb is nondilated, but redundant. Jejunal anastomosis is minimally patulous. There is no bowel obstruction, inflammation, pneumatosis or ischemia. Few fluid-filled loops of small bowel in the pelvis. Normal appendix. Small volume of formed stool in the colon. The  sigmoid colon is redundant. No colonic inflammation. Vascular/Lymphatic: Mild aortic atherosclerosis. No aortic aneurysm. Patent portal vein. No enlarged lymph nodes in the abdomen or pelvis. Reproductive: Hysterectomy.  No adnexal mass. Other: No free air, free fluid or focal fluid collection. No abdominal wall hernia. Musculoskeletal: Lower lumbar facet hypertrophy. Grade 1 anterolisthesis of L4 on L5 is likely facet mediated. There are no acute or suspicious osseous abnormalities. Incidental bone island in the pelvis. IMPRESSION: 1. Gastric bypass anatomy without bowel obstruction or inflammation. The Roux limb is redundant. 2. Layering stones or sludge in the gallbladder which is mildly distended. There is slight indistinctness of the pericholecystic fat, which can be seen with acute cholecystitis in the appropriate clinical setting. Recommend right upper quadrant ultrasound for further evaluation. 3. Bilateral nonobstructing renal calculi. 4. Mild wall thickening of the distal esophagus, can be seen with reflux or esophagitis. 5. Urinary bladder wall thickening at the  dome, recommend correlation with urinalysis to exclude urinary tract infection. Aortic Atherosclerosis (ICD10-I70.0). Electronically Signed   By: Keith Rake M.D.   On: 02/17/2021 21:51   NM Hepato W/EF  Result Date: 02/19/2021 CLINICAL DATA:  Right upper quadrant pain. Assess for cholecystitis. EXAM: NUCLEAR MEDICINE HEPATOBILIARY IMAGING WITH GALLBLADDER EF TECHNIQUE: Sequential images of the abdomen were obtained out to 60 minutes following intravenous administration of radiopharmaceutical. After slow intravenous infusion of 1.77 micrograms Cholecystokinin, gallbladder ejection fraction was determined. RADIOPHARMACEUTICALS:  5.5 mCi Tc-24m  Choletec IV COMPARISON:  Abdominal sonogram 02/17/2021 FINDINGS: Prompt uptake and biliary excretion of activity by the liver is seen. Biliary activity passes into small bowel, consistent with  patent common bile duct. After 90 minutes of imaging no gallbladder activity was visualized. The patient received 2.7 mg of morphine the IV infusion. Following the IV administration of morphine the gallbladder was visualized. Calculated gallbladder ejection fraction is 45.7%. (At 60 min, normal ejection fraction is greater than 40%.) IMPRESSION: 1. Patent cystic duct without evidence for acute cholecystitis. 2. Normal gallbladder ejection fraction at 45.7%. Electronically Signed   By: Kerby Moors M.D.   On: 02/19/2021 13:11   US Abdomen Limited RUQ (LIVER/GB)  Result Date: 02/17/2021 CLINICAL DATA:  Right upper quadrant pain and right flank pain. EXAM: ULTRASOUND ABDOMEN LIMITED RIGHT UPPER QUADRANT COMPARISON:  Abdomen and pelvis CT, dated February 17, 2021 FINDINGS: Gallbladder: Echogenic sludge and shadowing echogenic gallstones are seen within the distended gallbladder. The largest measures approximately 1.1 cm x 0.6 cm x 0.7 cm. A 0.5 cm x 0.5 cm x 0.6 cm gallstone is seen within the neck of the gallbladder. There is no evidence of gallbladder wall thickening (2.1 mm). No sonographic Murphy sign noted by sonographer. Common bile duct: Diameter: 6.6 mm Liver: No focal lesion identified. Within normal limits in parenchymal echogenicity. Portal vein is patent on color Doppler imaging with normal direction of blood flow towards the liver. Other: None. IMPRESSION: Cholelithiasis and gallbladder sludge, without evidence of acute cholecystitis. Further evaluation with a nuclear medicine hepatobiliary scan is recommended if this remains of clinical concern. Electronically Signed   By: Virgina Norfolk M.D.   On: 02/17/2021 22:58    Microbiology: Recent Results (from the past 240 hour(s))  Resp Panel by RT-PCR (Flu A&B, Covid) Nasopharyngeal Swab     Status: None   Collection Time: 02/18/21 12:23 AM   Specimen: Nasopharyngeal Swab; Nasopharyngeal(NP) swabs in vial transport medium  Result Value Ref Range  Status   SARS Coronavirus 2 by RT PCR NEGATIVE NEGATIVE Final    Comment: (NOTE) SARS-CoV-2 target nucleic acids are NOT DETECTED.  The SARS-CoV-2 RNA is generally detectable in upper respiratory specimens during the acute phase of infection. The lowest concentration of SARS-CoV-2 viral copies this assay can detect is 138 copies/mL. A negative result does not preclude SARS-Cov-2 infection and should not be used as the sole basis for treatment or other patient management decisions. A negative result may occur with  improper specimen collection/handling, submission of specimen other than nasopharyngeal swab, presence of viral mutation(s) within the areas targeted by this assay, and inadequate number of viral copies(<138 copies/mL). A negative result must be combined with clinical observations, patient history, and epidemiological information. The expected result is Negative.  Fact Sheet for Patients:  EntrepreneurPulse.com.au  Fact Sheet for Healthcare Providers:  IncredibleEmployment.be  This test is no t yet approved or cleared by the [place] FDA and  has been authorized for detection and/or diagnosis of  SARS-CoV-2 by FDA under an Emergency Use Authorization (EUA). This EUA will remain  in effect (meaning this test can be used) for the duration of the COVID-19 declaration under Section 564(b)(1) of the Act, 21 U.S.C.section 360bbb-3(b)(1), unless the authorization is terminated  or revoked sooner.       Influenza A by PCR NEGATIVE NEGATIVE Final   Influenza B by PCR NEGATIVE NEGATIVE Final    Comment: (NOTE) The Xpert Xpress SARS-CoV-2/FLU/RSV plus assay is intended as an aid in the diagnosis of influenza from Nasopharyngeal swab specimens and should not be used as a sole basis for treatment. Nasal washings and aspirates are unacceptable for Xpert Xpress SARS-CoV-2/FLU/RSV testing.  Fact Sheet for  Patients: EntrepreneurPulse.com.au  Fact Sheet for Healthcare Providers: IncredibleEmployment.be  This test is not yet approved or cleared by the [place] FDA and has been authorized for detection and/or diagnosis of SARS-CoV-2 by FDA under an Emergency Use Authorization (EUA). This EUA will remain in effect (meaning this test can be used) for the duration of the COVID-19 declaration under Section 564(b)(1) of the Act, 21 U.S.C. section 360bbb-3(b)(1), unless the authorization is terminated or revoked.  Performed at [facility], [address] [place], [place] [address]   Urine Culture     Status: Abnormal   Collection Time: 02/19/21  2:31 PM   Specimen: Urine, Clean Catch  Result Value Ref Range Status   Specimen Description URINE, CLEAN CATCH  Final   Special Requests   Final    NONE Performed at [facility], [place] [address], [place], [place] [address]    Culture MULTIPLE SPECIES PRESENT, SUGGEST RECOLLECTION (A)  Final   Report Status 02/20/2021 FINAL  Final     Labs: Basic Metabolic Panel: Recent Labs  Lab 02/17/21 2052 02/19/21 0219 02/20/21 0616  NA 141 135 134*  K 3.9 4.0 3.7  CL 102 100 97*  CO2 25 26 30   GLUCOSE 174* 163* 144*  BUN 24* 11 15  CREATININE 0.65 0.63 0.57  CALCIUM 10.0 9.2 8.9  MG  --   --  1.7  PHOS  --   --  1.4*   Liver Function Tests: Recent Labs  Lab 02/17/21 2052 02/20/21 0616  AST 16 19  ALT 17 24  ALKPHOS 49 60  BILITOT 0.3 0.9  PROT 7.3 5.9*  ALBUMIN 4.9 2.6*   Recent Labs  Lab 02/17/21 2052  LIPASE 46   No results for input(s): AMMONIA in the last 168 hours. CBC: Recent Labs  Lab 02/17/21 2052 02/19/21 0219 02/20/21 0616  WBC 14.6* 15.9* 11.1*  NEUTROABS 10.6*  --   --   HGB 14.6 14.1 13.0  HCT 43.8 44.1 40.5  MCV 93.6 96.5 96.4  PLT 270 218 192   Cardiac Enzymes: No results for input(s): CKTOTAL, CKMB, CKMBINDEX, TROPONINI in the last 168  hours. BNP: BNP (last 3 results) No results for input(s): BNP in the last 8760 hours.  ProBNP (last 3 results) No results for input(s): PROBNP in the last 8760 hours.  CBG: Recent Labs  Lab 02/19/21 1717 02/19/21 2111 02/20/21 0639 02/20/21 1231  GLUCAP 156* 153* 127* 120*       Signed:  Kayleen Memos, MD [facility] 02/20/2021, 1:22 PM

## 2021-02-21 ENCOUNTER — Other Ambulatory Visit: Payer: Self-pay | Admitting: *Deleted

## 2021-02-21 ENCOUNTER — Telehealth: Payer: Self-pay

## 2021-02-21 LAB — SURGICAL PATHOLOGY

## 2021-02-21 NOTE — Telephone Encounter (Signed)
-----   Message from Gatha Mayer, MD sent at 02/21/2021  8:33 AM EDT ----- Regarding: EGD Remo Lipps please arrange  Thanks Scott! ----- Message ----- From: Daryel November, MD Sent: 02/20/2021  12:10 PM EDT To: Marlon Pel, RN, Gatha Mayer, MD  [facility] Sheri,  hope your week is going well.  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 This patient needs a repeat EGD in 8 weeks to assess healing of a marginal ulcer.  Looks like she saw Dr. Carlean Purl first in 2015 and followed primarily by him.  I am happy to do it, but figured it should go to him first. Can you facilitate getting her scheduled?   Thanks

## 2021-02-21 NOTE — Patient Outreach (Signed)
[facility]) Care Management  02/21/2021  Sandra [name] June 11, 1960 [id number]  Transition of care telephone call  Referral received:02/20/21 Initial outreach:02/21/21 Insurance: [facility]   Initial unsuccessful telephone call to patient's preferred number in order to complete transition of care assessment; no answer, left HIPAA compliant voicemail message requesting return call.   Objective: Per the electronic medical record, Sandra [name] was hospitalized at [facility] 10/23-10/25/22 for Right lower quardrant abdominal pain, EGD  10/25 Marginal ulcers .Comorbidities include: Hypertension, Hyperlipidemia, Diabetes , gastric bypass surgery. She  was discharged to home on 02/20/21  without the need for home health services or DME.  Plan: This RNCM will route unsuccessful outreach letter with [facility] Management pamphlet and 24 hour Nurse Advice Line Magnet to [place] clinical pool to be mailed to patient's home address. This RNCM will attempt another outreach within 4 business days.   Joylene Draft, RN, BSN  [facility] Management Coordinator  [phone]- Mobile [phone]- Toll Free Main Office

## 2021-02-21 NOTE — Telephone Encounter (Signed)
Pt was scheduled a previsit for 04/09/2021 @ 1:30 Pt was scheduled for an EGD for 04/16/2021 @ 10:00 With Dr. Ammie Ferrier Pt requesting callback tomorrow so that she may write down dates and times of procedure.

## 2021-02-22 NOTE — Progress Notes (Signed)
Ms. Pietro,  Biopsies from your ulcer showed inflammatory changes only, with no evidence of malignancy or Helicobacter pylori infection.  Please continue taking the acid suppressing medication twice a day and the carafate as prescribed, and continue to avoid NSAIDs and smoking.  Plan to repeat EGD in 8 weeks as discussed to assess healing of the ulcer.

## 2021-02-26 ENCOUNTER — Other Ambulatory Visit: Payer: Self-pay | Admitting: *Deleted

## 2021-02-26 NOTE — Patient Outreach (Signed)
[facility]) Care Management  02/26/2021  Sandra [name] [birth year] [id number]   Transition of care call Referral received: 02/20/21 Initial outreach attempt: 02/21/21 Insurance:    2nd unsuccessful telephone call to patient's preferred contact number in order to complete post hospital discharge transition of care assessment , no answer left HIPAA compliant message requesting return call.    Objective: Per the electronic medical record, Sandra [name] was hospitalized at [facility] 10/23-10/25/22 for Right lower quardrant abdominal pain, EGD  10/25 Marginal ulcers .Comorbidities include: Hypertension, Hyperlipidemia, Diabetes , gastric bypass surgery. She  was discharged to home on 02/20/21  without the need for home health services or DME.    Plan If no return call from patient will attempt 3rd outreach in the next 4 business days.   Joylene Draft, RN, BSN  [facility] Management Coordinator  [phone]- Mobile [phone]- Toll Free Main Office

## 2021-02-27 ENCOUNTER — Other Ambulatory Visit (HOSPITAL_COMMUNITY): Payer: Self-pay

## 2021-02-27 ENCOUNTER — Telehealth: Payer: Self-pay | Admitting: Internal Medicine

## 2021-02-27 MED ORDER — OMEPRAZOLE 40 MG PO CPDR
40.0000 mg | DELAYED_RELEASE_CAPSULE | Freq: Two times a day (BID) | ORAL | 1 refills | Status: DC
  Filled 2021-02-27: qty 180, 90d supply, fill #0

## 2021-02-27 NOTE — Telephone Encounter (Signed)
Having persistent pain.  Staying on a liquid diet.  Has not moved her bowels in multiple days but does not feel like the pain is constipation.  She is continuing to have epigastric pain radiating up towards the chest like she did when she went to the hospital and was admitted.  Work-up there was initially suspicious for possible gallbladder problem due to pericholecystic fat stranding but the HIDA scan was negative her LFTs were negative and surgery signed off.  EGD demonstrated a marginal ulcer status post laparoscopic gastric bypass for bariatric surgery  She was discharged on pantoprazole twice daily and Carafate.  She has tried narcotic pain medicine but it was making her dizzy and prone to fall so she stopped that.  She is not worse but she is not better.  I offered her an appointment today but she is unable to come in.  Pantoprazole is often not well absorbed in patients with gastric bypass that is a tablet.  I am changing her to omeprazole and opening up the capsule and using the granules to see if that works better to heal her ulcers.  If this fails to resolve her problems she will let me know.  She will also try taking her pain pill at bedtime.  Laxatives as needed.

## 2021-02-27 NOTE — Telephone Encounter (Signed)
Pt states that she is having severe abdominal pain for 5 days straight,  cant stand up straight, fallen twice in the last 5 days. Pt states that she is taking zofran for nausea.NO diarrhea NO BM in 2 weeks Poor appetite Drinking fluids to stay hydrated.  Pt went to ED last week.  Please advise

## 2021-02-27 NOTE — Telephone Encounter (Signed)
Inbound call from pt requesting a call back stating that she is having severe abd pain. Please advise. Thank you. 

## 2021-02-28 NOTE — Telephone Encounter (Signed)
Pt scheduled for an Office Visit on 03/07/2021 @ 11:30: Pt made aware Pt verbalized understanding with all questions answered

## 2021-02-28 NOTE — Telephone Encounter (Signed)
-----   Message from Gatha Mayer, MD sent at 02/28/2021  8:47 AM EDT ----- Regarding: RE: EGD Why do not we just get her an office visit with me in November sometime probably middle you can use an 1130 or 350 or a banding spot. ----- Message ----- From: Gillermina Hu, RN Sent: 02/26/2021  11:04 AM EDT To: Gatha Mayer, MD Subject: RE: EGD                                        Just wanted to Follow back up on this as Spots are limited at [facility]. Please advise  ----- Message ----- From: Gatha Mayer, MD Sent: 02/22/2021  12:14 PM EDT To: Gatha Mayer, MD, Gillermina Hu, RN Subject: RE: EGD                                        She is correct - I forgot about that.  We will need to look at spots 8 weeks from now - most likely to her advantage to do before end of the year as far as $  May need to ask a partner to do - let me check my schedule and will see what we can come up with  Can cancel the [place] slot ----- Message ----- From: Gillermina Hu, RN Sent: 02/22/2021   9:42 AM EDT To: Gatha Mayer, MD Subject: RE: EGD                                        Dr. Carlean Purl, I scheduled this pt for the [place]. She is questioning if she needs to have the procedure at [facility]. She states that she has all her procedures at the Hospital. She states she has a small airway and even mentions John's name.  Please advise.  ----- Message ----- From: Gatha Mayer, MD Sent: 02/21/2021   8:34 AM EDT To: Marlon Pel, RN, Gillermina Hu, RN, # Subject: EGD                                            Remo Lipps please arrange  Thanks Scott! ----- Message ----- From: Daryel November, MD Sent: 02/20/2021  12:10 PM EDT To: Marlon Pel, RN, Gatha Mayer, MD  [facility] Sheri,  hope your week is going well.  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 This patient needs a repeat EGD in 8 weeks to assess healing of a  marginal ulcer.  Looks like she saw Dr. Carlean Purl first in 2015 and followed primarily by him.  I am happy to do it, but figured it should go to him first. Can you facilitate getting her scheduled?   Thanks

## 2021-03-01 ENCOUNTER — Other Ambulatory Visit: Payer: Self-pay | Admitting: *Deleted

## 2021-03-01 ENCOUNTER — Encounter: Payer: Self-pay | Admitting: *Deleted

## 2021-03-01 NOTE — Patient Outreach (Addendum)
[facility]) Care Management  03/01/2021  Sandra [name] 06/25/1960 [id number]   Transition of care call/case closure   Referral received:02/20/21 Initial outreach:03/24/21 Insurance: [place]    3rd outreach attempt  Subjective: Successful telephone call to patient's preferred number in order to complete transition of care assessment; 2 HIPAA identifiers verified. Explained purpose of call and completed transition of care assessment.  Sandra [name] discussed that it has been rough since her discharge home today, is first day she feels getting strength back.She discussed follow up with Dr. Carlean Purl and changes in protonix to omeprazole due to history of gastric bypass. She reports abdominal discomfort managed by prn tylenol. She discussed taking it slow  with her diet, decreased appetite but today feeling hungry , she is drinking protein shakes, protein water and staying hydrated and tolerating. She  denies bowel or bladder problems, taking miralax prn with results , she reports that urine is clear yellow colored.  Patient granddaughter lives with her and supportive brother assisting in her  recovery.   Reviewed accessing the following [place] Benefits :  She discussed history of  health issues, diabetes , hypertension controlled since gastric bypass  and says she does not need a referral to one of the [place] chronic disease management programs.  She does not have the hospital indemnity She uses a [facility] outpatient pharmacy at [place].   Objective:  Sandra [name] was hospitalized at [facility] 10/23-10/25/22 for Right lower quardrant abdominal pain, EGD  10/25 Marginal ulcers .Comorbidities include: Hypertension, Hyperlipidemia, Diabetes , gastric bypass surgery. She  was discharged to home on 02/20/21  without the need for home health services or DME.  Assessment:  Patient voices good understanding of all discharge instructions.   See transition of care flowsheet for assessment details.   Plan:  Reviewed hospital discharge diagnosis of RLQ abdominal pai   and discharge treatment plan using hospital discharge instructions, assessing medication adherence, reviewing problems requiring provider notification, and discussing the importance of follow up with surgeon, primary care provider and/or specialists as directed.  Reviewed [place] healthy lifestyle program information to earn healthy premium rate for  2024  :  Step 1: Create account at [email or web address] Step 2: Complete your health assessment and annual physical by September 1. 2023.  For questions call [phone]   No ongoing care management needs identified so will close case to [place] Management services .Thanked patient for their services to [facility].   Joylene Draft, RN, BSN  [place] Management Coordinator  [phone]- Mobile [phone]- Toll Free Main Office

## 2021-03-02 ENCOUNTER — Ambulatory Visit: Payer: 59 | Admitting: Family Medicine

## 2021-03-05 ENCOUNTER — Other Ambulatory Visit: Payer: Self-pay

## 2021-03-05 ENCOUNTER — Other Ambulatory Visit (HOSPITAL_COMMUNITY): Payer: Self-pay

## 2021-03-05 ENCOUNTER — Other Ambulatory Visit (INDEPENDENT_AMBULATORY_CARE_PROVIDER_SITE_OTHER): Payer: 59

## 2021-03-05 ENCOUNTER — Other Ambulatory Visit: Payer: Self-pay | Admitting: Internal Medicine

## 2021-03-05 DIAGNOSIS — R1010 Upper abdominal pain, unspecified: Secondary | ICD-10-CM

## 2021-03-05 DIAGNOSIS — Z1231 Encounter for screening mammogram for malignant neoplasm of breast: Secondary | ICD-10-CM

## 2021-03-05 DIAGNOSIS — D75839 Thrombocytosis, unspecified: Secondary | ICD-10-CM

## 2021-03-05 DIAGNOSIS — R748 Abnormal levels of other serum enzymes: Secondary | ICD-10-CM

## 2021-03-05 LAB — COMPREHENSIVE METABOLIC PANEL
ALT: 27 U/L (ref 0–35)
AST: 31 U/L (ref 0–37)
Albumin: 3.1 g/dL — ABNORMAL LOW (ref 3.5–5.2)
Alkaline Phosphatase: 150 U/L — ABNORMAL HIGH (ref 39–117)
BUN: 13 mg/dL (ref 6–23)
CO2: 34 mEq/L — ABNORMAL HIGH (ref 19–32)
Calcium: 8.9 mg/dL (ref 8.4–10.5)
Chloride: 92 mEq/L — ABNORMAL LOW (ref 96–112)
Creatinine, Ser: 0.4 mg/dL (ref 0.40–1.20)
GFR: 106.92 mL/min (ref >60.00)
Glucose, Bld: 157 mg/dL — ABNORMAL HIGH (ref 70–99)
Potassium: 3.2 mEq/L — ABNORMAL LOW (ref 3.5–5.1)
Sodium: 136 mEq/L (ref 135–145)
Total Bilirubin: 0.7 mg/dL (ref 0.2–1.2)
Total Protein: 6.8 g/dL (ref 6.0–8.3)

## 2021-03-05 LAB — CBC
HCT: 36.5 % (ref 36.0–46.0)
Hemoglobin: 12.1 g/dL (ref 12.0–15.0)
MCHC: 33.2 g/dL (ref 30.0–36.0)
MCV: 91.6 fl (ref 78.0–100.0)
Platelets: 701 10*3/uL — ABNORMAL HIGH (ref 150.0–400.0)
RBC: 3.99 Mil/uL (ref 3.87–5.11)
RDW: 13.7 % (ref 11.5–15.5)
WBC: 14.4 10*3/uL — ABNORMAL HIGH (ref 4.0–10.5)

## 2021-03-05 LAB — LIPASE: Lipase: 69 U/L — ABNORMAL HIGH (ref 11.0–59.0)

## 2021-03-05 MED ORDER — OXYCODONE HCL 5 MG PO TABS
5.0000 mg | ORAL_TABLET | ORAL | 0 refills | Status: AC | PRN
  Filled 2021-03-05: qty 20, 4d supply, fill #0

## 2021-03-05 NOTE — Telephone Encounter (Signed)
No fever Keeping liquids in and had BM with Linzess  Pain not as bad as when went to ED but still bad  I have ordered CBC, CMET and lipase which she will do today  Oxycodone Rx # 20 5 mg  She is to go to ED if intensifies  Will see her Wed AM as planned

## 2021-03-06 ENCOUNTER — Inpatient Hospital Stay (HOSPITAL_COMMUNITY)
Admission: EM | Admit: 2021-03-06 | Discharge: 2021-03-09 | DRG: 444 | Disposition: A | Discharge status: Home / Self Care | Payer: 59 | Attending: Internal Medicine | Admitting: Internal Medicine

## 2021-03-06 ENCOUNTER — Encounter (HOSPITAL_COMMUNITY): Payer: Self-pay

## 2021-03-06 ENCOUNTER — Observation Stay (HOSPITAL_COMMUNITY): Payer: 59

## 2021-03-06 ENCOUNTER — Ambulatory Visit (HOSPITAL_COMMUNITY)
Admission: RE | Admit: 2021-03-06 | Discharge: 2021-03-06 | Disposition: A | Discharge status: Home / Self Care | Payer: 59 | Source: Ambulatory Visit | Attending: Internal Medicine | Admitting: Internal Medicine

## 2021-03-06 ENCOUNTER — Other Ambulatory Visit: Payer: Self-pay

## 2021-03-06 ENCOUNTER — Telehealth: Payer: Self-pay | Admitting: Internal Medicine

## 2021-03-06 DIAGNOSIS — Z8542 Personal history of malignant neoplasm of other parts of uterus: Secondary | ICD-10-CM | POA: Diagnosis not present

## 2021-03-06 DIAGNOSIS — F419 Anxiety disorder, unspecified: Secondary | ICD-10-CM | POA: Diagnosis not present

## 2021-03-06 DIAGNOSIS — D75839 Thrombocytosis, unspecified: Secondary | ICD-10-CM | POA: Diagnosis present

## 2021-03-06 DIAGNOSIS — N3000 Acute cystitis without hematuria: Secondary | ICD-10-CM | POA: Diagnosis present

## 2021-03-06 DIAGNOSIS — D6489 Other specified anemias: Secondary | ICD-10-CM | POA: Diagnosis present

## 2021-03-06 DIAGNOSIS — Z79899 Other long term (current) drug therapy: Secondary | ICD-10-CM

## 2021-03-06 DIAGNOSIS — Z90710 Acquired absence of both cervix and uterus: Secondary | ICD-10-CM | POA: Diagnosis not present

## 2021-03-06 DIAGNOSIS — F32A Depression, unspecified: Secondary | ICD-10-CM | POA: Diagnosis present

## 2021-03-06 DIAGNOSIS — E876 Hypokalemia: Secondary | ICD-10-CM | POA: Diagnosis present

## 2021-03-06 DIAGNOSIS — R1010 Upper abdominal pain, unspecified: Secondary | ICD-10-CM

## 2021-03-06 DIAGNOSIS — I2510 Atherosclerotic heart disease of native coronary artery without angina pectoris: Secondary | ICD-10-CM | POA: Diagnosis present

## 2021-03-06 DIAGNOSIS — K219 Gastro-esophageal reflux disease without esophagitis: Secondary | ICD-10-CM | POA: Diagnosis not present

## 2021-03-06 DIAGNOSIS — D6959 Other secondary thrombocytopenia: Secondary | ICD-10-CM | POA: Diagnosis present

## 2021-03-06 DIAGNOSIS — R748 Abnormal levels of other serum enzymes: Secondary | ICD-10-CM | POA: Insufficient documentation

## 2021-03-06 DIAGNOSIS — D75838 Other thrombocytosis: Secondary | ICD-10-CM | POA: Diagnosis present

## 2021-03-06 DIAGNOSIS — R109 Unspecified abdominal pain: Secondary | ICD-10-CM | POA: Diagnosis not present

## 2021-03-06 DIAGNOSIS — K82A2 Perforation of gallbladder in cholecystitis: Secondary | ICD-10-CM | POA: Diagnosis present

## 2021-03-06 DIAGNOSIS — K8000 Calculus of gallbladder with acute cholecystitis without obstruction: Principal | ICD-10-CM | POA: Diagnosis present

## 2021-03-06 DIAGNOSIS — I10 Essential (primary) hypertension: Secondary | ICD-10-CM | POA: Diagnosis present

## 2021-03-06 DIAGNOSIS — E7800 Pure hypercholesterolemia, unspecified: Secondary | ICD-10-CM | POA: Diagnosis not present

## 2021-03-06 DIAGNOSIS — K5909 Other constipation: Secondary | ICD-10-CM | POA: Diagnosis present

## 2021-03-06 DIAGNOSIS — E440 Moderate protein-calorie malnutrition: Secondary | ICD-10-CM | POA: Diagnosis present

## 2021-03-06 DIAGNOSIS — K651 Peritoneal abscess: Secondary | ICD-10-CM | POA: Diagnosis present

## 2021-03-06 DIAGNOSIS — L02211 Cutaneous abscess of abdominal wall: Secondary | ICD-10-CM | POA: Diagnosis not present

## 2021-03-06 DIAGNOSIS — Z20822 Contact with and (suspected) exposure to covid-19: Secondary | ICD-10-CM | POA: Diagnosis present

## 2021-03-06 DIAGNOSIS — Z978 Presence of other specified devices: Secondary | ICD-10-CM | POA: Diagnosis not present

## 2021-03-06 DIAGNOSIS — D649 Anemia, unspecified: Secondary | ICD-10-CM | POA: Diagnosis not present

## 2021-03-06 DIAGNOSIS — Z6826 Body mass index (BMI) 26.0-26.9, adult: Secondary | ICD-10-CM

## 2021-03-06 DIAGNOSIS — E871 Hypo-osmolality and hyponatremia: Secondary | ICD-10-CM | POA: Diagnosis present

## 2021-03-06 DIAGNOSIS — Z9884 Bariatric surgery status: Secondary | ICD-10-CM

## 2021-03-06 DIAGNOSIS — E119 Type 2 diabetes mellitus without complications: Secondary | ICD-10-CM

## 2021-03-06 DIAGNOSIS — J90 Pleural effusion, not elsewhere classified: Secondary | ICD-10-CM | POA: Diagnosis not present

## 2021-03-06 DIAGNOSIS — E1165 Type 2 diabetes mellitus with hyperglycemia: Secondary | ICD-10-CM | POA: Diagnosis present

## 2021-03-06 DIAGNOSIS — K822 Perforation of gallbladder: Secondary | ICD-10-CM | POA: Diagnosis not present

## 2021-03-06 DIAGNOSIS — K810 Acute cholecystitis: Secondary | ICD-10-CM | POA: Diagnosis not present

## 2021-03-06 HISTORY — PX: IR PERC CHOLECYSTOSTOMY: IMG2326

## 2021-03-06 LAB — URINALYSIS, ROUTINE W REFLEX MICROSCOPIC
Bilirubin Urine: NEGATIVE
Glucose, UA: NEGATIVE mg/dL
Ketones, ur: 5 mg/dL — AB
Nitrite: NEGATIVE
Protein, ur: NEGATIVE mg/dL
Specific Gravity, Urine: >1.046 — ABNORMAL HIGH (ref 1.005–1.030)
pH: 5 (ref 5.0–8.0)

## 2021-03-06 LAB — CBC WITH DIFFERENTIAL/PLATELET
Abs Immature Granulocytes: 0.1 10*3/uL — ABNORMAL HIGH (ref 0.00–0.07)
Basophils Absolute: 0 10*3/uL (ref 0.0–0.1)
Basophils Relative: 0 %
Eosinophils Absolute: 0 10*3/uL (ref 0.0–0.5)
Eosinophils Relative: 0 %
HCT: 33.1 % — ABNORMAL LOW (ref 36.0–46.0)
Hemoglobin: 11 g/dL — ABNORMAL LOW (ref 12.0–15.0)
Immature Granulocytes: 1 %
Lymphocytes Relative: 13 %
Lymphs Abs: 1.9 10*3/uL (ref 0.7–4.0)
MCH: 30.5 pg (ref 26.0–34.0)
MCHC: 33.2 g/dL (ref 30.0–36.0)
MCV: 91.7 fL (ref 80.0–100.0)
Monocytes Absolute: 1.3 10*3/uL — ABNORMAL HIGH (ref 0.1–1.0)
Monocytes Relative: 9 %
Neutro Abs: 11.3 10*3/uL — ABNORMAL HIGH (ref 1.7–7.7)
Neutrophils Relative %: 77 %
Platelets: 634 10*3/uL — ABNORMAL HIGH (ref 150–400)
RBC: 3.61 MIL/uL — ABNORMAL LOW (ref 3.87–5.11)
RDW: 13.7 % (ref 11.5–15.5)
WBC: 14.7 10*3/uL — ABNORMAL HIGH (ref 4.0–10.5)
nRBC: 0 % (ref 0.0–0.2)

## 2021-03-06 LAB — COMPREHENSIVE METABOLIC PANEL
ALT: 27 U/L (ref 0–44)
AST: 27 U/L (ref 15–41)
Albumin: 2.3 g/dL — ABNORMAL LOW (ref 3.5–5.0)
Alkaline Phosphatase: 143 U/L — ABNORMAL HIGH (ref 38–126)
Anion gap: 12 (ref 5–15)
BUN: 8 mg/dL (ref 8–23)
CO2: 29 mmol/L (ref 22–32)
Calcium: 8.3 mg/dL — ABNORMAL LOW (ref 8.9–10.3)
Chloride: 90 mmol/L — ABNORMAL LOW (ref 98–111)
Creatinine, Ser: 0.35 mg/dL — ABNORMAL LOW (ref 0.44–1.00)
GFR, Estimated: >60 mL/min (ref >60)
Glucose, Bld: 155 mg/dL — ABNORMAL HIGH (ref 70–99)
Potassium: 3 mmol/L — ABNORMAL LOW (ref 3.5–5.1)
Sodium: 131 mmol/L — ABNORMAL LOW (ref 135–145)
Total Bilirubin: 0.9 mg/dL (ref 0.3–1.2)
Total Protein: 6.4 g/dL — ABNORMAL LOW (ref 6.5–8.1)

## 2021-03-06 LAB — PROTIME-INR
INR: 1.2 (ref 0.8–1.2)
Prothrombin Time: 15 seconds (ref 11.4–15.2)

## 2021-03-06 LAB — PHOSPHORUS: Phosphorus: 3.6 mg/dL (ref 2.5–4.6)

## 2021-03-06 LAB — LIPASE, BLOOD: Lipase: 23 U/L (ref 11–51)

## 2021-03-06 LAB — RESP PANEL BY RT-PCR (FLU A&B, COVID) ARPGX2
Influenza A by PCR: NEGATIVE
Influenza B by PCR: NEGATIVE
SARS Coronavirus 2 by RT PCR: NEGATIVE

## 2021-03-06 LAB — MAGNESIUM: Magnesium: 1.8 mg/dL (ref 1.7–2.4)

## 2021-03-06 MED ORDER — POTASSIUM CHLORIDE 10 MEQ/100ML IV SOLN
10.0000 meq | INTRAVENOUS | Status: AC
  Administered 2021-03-06 (×2): 10 meq via INTRAVENOUS
  Filled 2021-03-06 (×2): qty 100

## 2021-03-06 MED ORDER — ONDANSETRON HCL 4 MG/2ML IJ SOLN: 4.0000 mg | Freq: Four times a day (QID) | INTRAMUSCULAR | Status: DC | PRN

## 2021-03-06 MED ORDER — LIDOCAINE HCL 1 % IJ SOLN
INTRAMUSCULAR | Status: AC
  Filled 2021-03-06: qty 20

## 2021-03-06 MED ORDER — FENTANYL CITRATE (PF) 100 MCG/2ML IJ SOLN
INTRAMUSCULAR | Status: AC | PRN
Start: 2021-03-06 — End: 2021-03-06
  Administered 2021-03-06 (×2): 50 ug via INTRAVENOUS

## 2021-03-06 MED ORDER — LACTATED RINGERS IV BOLUS: 1000.0000 mL | Freq: Three times a day (TID) | INTRAVENOUS | Status: AC | PRN

## 2021-03-06 MED ORDER — ONDANSETRON HCL 4 MG PO TABS
4.0000 mg | ORAL_TABLET | Freq: Four times a day (QID) | ORAL | Status: DC | PRN
  Administered 2021-03-08: 4 mg via ORAL
  Filled 2021-03-06: qty 1

## 2021-03-06 MED ORDER — MIDAZOLAM HCL 2 MG/2ML IJ SOLN
INTRAMUSCULAR | Status: AC
  Filled 2021-03-06: qty 4

## 2021-03-06 MED ORDER — ACETAMINOPHEN 650 MG RE SUPP
650.0000 mg | Freq: Once | RECTAL | Status: AC | PRN
  Administered 2021-03-07: 650 mg via RECTAL
  Filled 2021-03-06: qty 1

## 2021-03-06 MED ORDER — METOPROLOL TARTRATE 5 MG/5ML IV SOLN: 5.0000 mg | Freq: Four times a day (QID) | INTRAVENOUS | Status: DC | PRN

## 2021-03-06 MED ORDER — PHENOL 1.4 % MT LIQD: 2.0000 | OROMUCOSAL | Status: DC | PRN

## 2021-03-06 MED ORDER — MENTHOL 3 MG MT LOZG: 1.0000 | LOZENGE | OROMUCOSAL | Status: DC | PRN

## 2021-03-06 MED ORDER — ACETAMINOPHEN 500 MG PO TABS
1000.0000 mg | ORAL_TABLET | Freq: Four times a day (QID) | ORAL | Status: DC
  Administered 2021-03-06 – 2021-03-09 (×9): 1000 mg via ORAL
  Filled 2021-03-06 (×11): qty 2

## 2021-03-06 MED ORDER — HYDROMORPHONE HCL 1 MG/ML IJ SOLN: 1.0000 mg | INTRAMUSCULAR | Status: DC | PRN

## 2021-03-06 MED ORDER — METHOCARBAMOL 1000 MG/10ML IJ SOLN
1000.0000 mg | Freq: Four times a day (QID) | INTRAVENOUS | Status: DC | PRN
  Administered 2021-03-07 (×3): 1000 mg via INTRAVENOUS
  Filled 2021-03-06 (×2): qty 1000
  Filled 2021-03-06: qty 10
  Filled 2021-03-06: qty 1000

## 2021-03-06 MED ORDER — ALUM & MAG HYDROXIDE-SIMETH 200-200-20 MG/5ML PO SUSP: 30.0000 mL | Freq: Four times a day (QID) | ORAL | Status: DC | PRN

## 2021-03-06 MED ORDER — MORPHINE SULFATE (PF) 4 MG/ML IV SOLN
4.0000 mg | Freq: Once | INTRAVENOUS | Status: AC
  Administered 2021-03-06: 4 mg via INTRAVENOUS
  Filled 2021-03-06: qty 1

## 2021-03-06 MED ORDER — ACETAMINOPHEN 325 MG PO TABS: 650.0000 mg | ORAL_TABLET | Freq: Four times a day (QID) | ORAL | Status: DC | PRN

## 2021-03-06 MED ORDER — DIPHENHYDRAMINE HCL 50 MG/ML IJ SOLN
12.5000 mg | Freq: Four times a day (QID) | INTRAMUSCULAR | Status: DC | PRN
  Administered 2021-03-07: 12.5 mg via INTRAVENOUS
  Administered 2021-03-09: 25 mg via INTRAVENOUS
  Filled 2021-03-06 (×2): qty 1

## 2021-03-06 MED ORDER — LIP MEDEX EX OINT
1.0000 "application " | TOPICAL_OINTMENT | Freq: Two times a day (BID) | CUTANEOUS | Status: DC
  Administered 2021-03-06 – 2021-03-09 (×5): 1 via TOPICAL
  Filled 2021-03-06 (×2): qty 7

## 2021-03-06 MED ORDER — MIDAZOLAM HCL 2 MG/2ML IJ SOLN
INTRAMUSCULAR | Status: AC | PRN
  Administered 2021-03-06 (×2): 1 mg via INTRAVENOUS

## 2021-03-06 MED ORDER — SODIUM CHLORIDE 0.9 % IV BOLUS
1000.0000 mL | Freq: Once | INTRAVENOUS | Status: AC
  Administered 2021-03-06: 1000 mL via INTRAVENOUS

## 2021-03-06 MED ORDER — ENALAPRILAT 1.25 MG/ML IV SOLN
0.6250 mg | Freq: Four times a day (QID) | INTRAVENOUS | Status: DC | PRN
Start: 2021-03-06 — End: 2021-03-09
  Filled 2021-03-06: qty 1

## 2021-03-06 MED ORDER — LACTATED RINGERS IV BOLUS
500.0000 mL | Freq: Once | INTRAVENOUS | Status: AC
  Administered 2021-03-06: 500 mL via INTRAVENOUS

## 2021-03-06 MED ORDER — PIPERACILLIN-TAZOBACTAM 3.375 G IVPB
3.3750 g | Freq: Once | INTRAVENOUS | Status: AC
  Administered 2021-03-06: 3.375 g via INTRAVENOUS
  Filled 2021-03-06: qty 50

## 2021-03-06 MED ORDER — IOHEXOL 300 MG/ML  SOLN
25.0000 mL | Freq: Once | INTRAMUSCULAR | Status: AC | PRN
  Administered 2021-03-06: 10 mL

## 2021-03-06 MED ORDER — DIATRIZOATE MEGLUMINE & SODIUM 66-10 % PO SOLN
ORAL | Status: AC
  Filled 2021-03-06: qty 30

## 2021-03-06 MED ORDER — SODIUM CHLORIDE 0.9% FLUSH
5.0000 mL | Freq: Three times a day (TID) | INTRAVENOUS | Status: DC
  Administered 2021-03-06 – 2021-03-09 (×8): 5 mL

## 2021-03-06 MED ORDER — POTASSIUM CHLORIDE IN NACL 20-0.9 MEQ/L-% IV SOLN
INTRAVENOUS | Status: DC
  Filled 2021-03-06 (×2): qty 1000

## 2021-03-06 MED ORDER — HYDROMORPHONE HCL 1 MG/ML IJ SOLN
0.5000 mg | INTRAMUSCULAR | Status: DC | PRN
Start: 2021-03-06 — End: 2021-03-09
  Filled 2021-03-06: qty 1

## 2021-03-06 MED ORDER — ACETAMINOPHEN 650 MG RE SUPP: 650.0000 mg | Freq: Four times a day (QID) | RECTAL | Status: DC | PRN

## 2021-03-06 MED ORDER — LACTATED RINGERS IV BOLUS
1000.0000 mL | Freq: Once | INTRAVENOUS | Status: AC
  Administered 2021-03-06: 1000 mL via INTRAVENOUS

## 2021-03-06 MED ORDER — PROCHLORPERAZINE EDISYLATE 10 MG/2ML IJ SOLN
5.0000 mg | INTRAMUSCULAR | Status: DC | PRN
Start: 2021-03-06 — End: 2021-03-09

## 2021-03-06 MED ORDER — FENTANYL CITRATE (PF) 100 MCG/2ML IJ SOLN
INTRAMUSCULAR | Status: AC
  Filled 2021-03-06: qty 2

## 2021-03-06 MED ORDER — IOHEXOL 350 MG/ML SOLN
80.0000 mL | Freq: Once | INTRAVENOUS | Status: AC | PRN
  Administered 2021-03-06: 80 mL via INTRAVENOUS

## 2021-03-06 MED ORDER — ONDANSETRON HCL 4 MG/2ML IJ SOLN
4.0000 mg | Freq: Once | INTRAMUSCULAR | Status: AC
  Administered 2021-03-06: 4 mg via INTRAVENOUS
  Filled 2021-03-06: qty 2

## 2021-03-06 MED ORDER — ENOXAPARIN SODIUM 40 MG/0.4ML IJ SOSY
40.0000 mg | PREFILLED_SYRINGE | INTRAMUSCULAR | Status: DC
  Administered 2021-03-07 – 2021-03-09 (×3): 40 mg via SUBCUTANEOUS
  Filled 2021-03-06 (×3): qty 0.4

## 2021-03-06 MED ORDER — PIPERACILLIN-TAZOBACTAM 3.375 G IVPB
3.3750 g | Freq: Three times a day (TID) | INTRAVENOUS | Status: DC
  Administered 2021-03-07 – 2021-03-09 (×8): 3.375 g via INTRAVENOUS
  Filled 2021-03-06 (×12): qty 50

## 2021-03-06 MED ORDER — MAGIC MOUTHWASH
15.0000 mL | Freq: Four times a day (QID) | ORAL | Status: DC | PRN
  Filled 2021-03-06: qty 15

## 2021-03-06 NOTE — Progress Notes (Signed)
A consult was received from an ED physician for Zosyn per pharmacy dosing.  The patient's profile has been reviewed for ht/wt/allergies/indication/available labs.   A one time order has been placed for Zosyn 3.375g IV x1.  Further antibiotics/pharmacy consults should be ordered by admitting physician if indicated.                       Thank you,  Dimple Nanas, PharmD 03/06/2021 1:50 PM

## 2021-03-06 NOTE — Progress Notes (Addendum)
Called to bedside by IR nurse to assess patient. Pt was outpatient CT and now needs admission to hospital. Patient alert, BP 124/78, 98 bpm, 96% on RA. Reports being uncomfortable, was able to stand and walk to w/c independently. Patient transported to ED registration in w/c, patient's brother is with her.

## 2021-03-06 NOTE — Consult Note (Signed)
[facility] Surgery Consult Note  Sandra [name] July 26, 1960  [id number].    Requesting MD: Teressa Lower Chief Complaint/Reason for Consult: RUQ abscess, concern for perforated cholecystitis   HPI:  Patient was sent to [facility] for admission by her gastroenterologist when outpatient CT revealed a large subhepatic abscess. She reports 2-3 weeks of abdominal pain that has become gradually worse. States that the pain was acutely worse three days ago. The pain is mostly epigastric and does radiate to the right hemiabdomen. Previously had some n/v but none for several days. Denies fever or chills. Tolerating PO but not eating a lot. She has been taking PPI and carafate but this did not help. She also tried narcotic pain medication but it was making her dizzy so she stopped that.  Was admitted to the hospital 02/17/21-02/20/21 for abdominal pain. Was evaluated by our surgical team 10/23. During our evaluation her pain was in her lower abdomen. RUQ [place] at that time showed cholelithiasis without signs of cholecystitis. HIDA scan was ordered and was negative for cholecystitis so general surgery signed off. She underwent EGD during that admission that showed a marginal ulcer and she was discharged home on BID PPI and carafate.   Abdominal surgeries: gastric bypass, c section, hysterectomy Nonsmoker Denies alcohol use Employment: endo tech at [employer]: Review of Systems  Constitutional: Negative.   Respiratory: Negative.    Cardiovascular: Negative.   Gastrointestinal:  Positive for abdominal pain and constipation. Negative for nausea and vomiting.   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 HYSTERECTOMY   STAGE I ENDOMETROID CARCINOMA   Hx of adenomatous colonic polyps 2011   BENIGN   Hyperlipidemia    no meds now   Hypertension    Sleep apnea    no CPAP since surgery    Past Surgical History:  Procedure Laterality Date   ABDOMINAL HYSTERECTOMY  12-20-2004   TAH   BILATERAL BREAST REDUCTION  11-16-1999   BIOPSY  02/20/2021   Procedure: BIOPSY;  Surgeon: Daryel November, MD;  Location[facility] ENDOSCOPY;  Service: Gastroenterology;;   Rosanne Gutting PYLORI N/A 05/27/2013   Procedure: Lauris Chroman;  Surgeon: Shann Medal, MD;  Location: [facility] ENDOS[facility];  Service: General;  Laterality: N/A;   [place] N/A 04/25/2014   Procedure: COLONOSCOPY;  Surgeon: Gatha Mayer, MD;  Location: [facility] ENDOSCOPY;  Service: Endoscopy;  Laterality: N/A;   COLONOSCOPY WITH PROPOFOL N/A 07/26/2019   Procedure: COLONOSCOPY WITH PROPOFOL;  Surgeon: Gatha Mayer, MD;  Location: [facility] ENDOSCOPY;  Service: Endoscopy;  Laterality: N/A;   CYSTOSCOPY  09/06/2011   Procedure: CYSTOSCOPY;  Surgeon: Ailene Rud, MD;  Location: [facility];  Service: Urology;  Laterality: N/A;  LYNX SLING    ESOPHAGOGASTRODUODENOSCOPY (EGD) WITH PROPOFOL N/A 09/27/2014   Procedure: ESOPHAGOGASTRODUODENOSCOPY (EGD) WITH PROPOFOL;  Surgeon: Gatha Mayer, MD;  Location: [facility] ENDOSCOPY;  Service: Endoscopy;  Laterality: N/A;   ESOPHAGOGASTRODUODENOSCOPY (EGD) WITH PROPOFOL N/A 03/11/2016   Procedure: ESOPHAGOGASTRODUODENOSCOPY (EGD) WITH  PROPOFOL;  Surgeon: Jerene Bears, MD;  Location: [facility] ENDOSCOPY;  Service: Endoscopy;  Laterality: N/A;   ESOPHAGOGASTRODUODENOSCOPY (EGD) WITH PROPOFOL Left 02/20/2021   Procedure: ESOPHAGOGASTRODUODENOSCOPY (EGD) WITH PROPOFOL;  Surgeon: Daryel November, MD;  Location: [facility];  Service: Gastroenterology;  Laterality: Left;   GASTRIC BYPASS  10/04/2013   GASTRIC ROUX-EN-Y N/A 10/04/2013   Procedure: LAPAROSCOPIC ROUX-EN-Y GASTRIC BYPASS WITH UPPER ENDOSCOPY;  Surgeon: Shann Medal, MD;  Location: [facility] ORS;  Service: General;  Laterality: N/A;   HEMORRHOID BANDING  02/2013   POLYPECTOMY     POLYPECTOMY  07/26/2019   Procedure: POLYPECTOMY;  Surgeon: Gatha Mayer, MD;  Location: [facility] ENDOSCOPY;  Service: Endoscopy;;   PUBOVAGINAL SLING  09/06/2011   Procedure: Gaynelle Arabian;  Surgeon: Ailene Rud, MD;  [facility];  Service: Urology;  Laterality: N/A;   REDUCTION MAMMAPLASTY Bilateral 2001   SHOULDER ARTHROSCOPY Right 10/23/2017   Procedure: RIGHT SHOULDER ARTHROSCOPIC ROTATOR CUFF REPAIR VS. DEBRIDEMENT, SUBACROMIAL DECOMPARESSION, DISTAL CLAVICAL EXCISION;  Surgeon: Tania Ade, MD;  Location: [facility];  Service: Orthopedics;  Laterality: Right;   UVULOPALATOPHARYNGOPLASTY, TONSILLECTOMY AND SEPTOPLASTY  1994    Social History:  reports that she has never smoked. She has never used smokeless tobacco. She reports that she does not drink alcohol and does not use drugs.  Allergies:  Allergies  Allergen Reactions   Nsaids     r/t gastric surgery   Statins     (Not in a hospital admission)   Blood pressure 108/69, pulse 84, temperature 99.9 F (37.7 C), temperature source Oral, resp. rate 18, SpO2 97 %. Physical Exam: General: pleasant, WD/WN female who is laying in bed in NAD HEENT: head is normocephalic, atraumatic.  Sclera are noninjected.  Pupils equal and round.  Ears and nose without any masses or lesions.  Mouth is pink and moist.  Dentition fair Heart: regular, rate, and rhythm.  Normal s1,s2. No obvious murmurs, gallops, or rubs noted.  Palpable pedal pulses bilaterally  Lungs: CTAB, no wheezes, rhonchi, or rales noted.  Respiratory effort nonlabored Abd: well healed lap incisions, soft, minimal distension, TTP epigastric region and RUQ without peritonitis. No masses, hernias, or organomegaly MS: no BUE/BLE edema, calves soft and nontender Skin: warm and dry with no masses, lesions, or rashes Psych: A&Ox4 with an appropriate affect Neuro: cranial nerves grossly intact, equal strength in BUE/BLE bilaterally, normal speech, thought process intact   Results for orders placed or performed during the hospital encounter of 03/06/21 (from the past 48 hour(s))  CBC with Differential     Status: Abnormal   Collection Time: 03/06/21 10:37 AM  Result Value Ref Range   WBC 14.7 (H) 4.0 - 10.5 K/uL   RBC 3.61 (L) 3.87 - 5.11 MIL/uL   Hemoglobin 11.0 (L) 12.0 - 15.0 g/dL   HCT 33.1 (L) 36.0 - 46.0 %   MCV 91.7 80.0 - 100.0 fL   MCH 30.5 26.0 - 34.0 pg   MCHC 33.2 30.0 - 36.0 g/dL   RDW  13.7 11.5 - 15.5 %   Platelets 634 (H) 150 - 400 K/uL   nRBC 0.0 0.0 - 0.2 %   Neutrophils Relative % 77 %   Neutro Abs 11.3 (H) 1.7 - 7.7 K/uL   Lymphocytes Relative 13 %   Lymphs Abs 1.9 0.7 - 4.0 K/uL   Monocytes Relative 9 %   Monocytes Absolute 1.3 (H) 0.1 - 1.0 K/uL   Eosinophils Relative 0 %   Eosinophils Absolute 0.0 0.0 - 0.5 K/uL   Basophils Relative 0 %   Basophils Absolute 0.0 0.0 - 0.1 K/uL   Immature Granulocytes 1 %   Abs Immature Granulocytes 0.10 (H) 0.00 - 0.07 K/uL    Comment: Performed at [facility], [place] [address] [place], [place] [address]  Comprehensive metabolic panel     Status: Abnormal   Collection Time: 03/06/21 10:37 AM  Result Value Ref Range   Sodium 131 (L) 135 - 145 mmol/L   Potassium 3.0 (L) 3.5 - 5.1 mmol/L   Chloride 90 (L) 98 - 111 mmol/L   CO2 29 22 - 32 mmol/L   Glucose,  Bld 155 (H) 70 - 99 mg/dL    Comment: Glucose reference range applies only to samples taken after fasting for at least 8 hours.   BUN 8 8 - 23 mg/dL   Creatinine, Ser 0.35 (L) 0.44 - 1.00 mg/dL   Calcium 8.3 (L) 8.9 - 10.3 mg/dL   Total Protein 6.4 (L) 6.5 - 8.1 g/dL   Albumin 2.3 (L) 3.5 - 5.0 g/dL   AST 27 15 - 41 U/L   ALT 27 0 - 44 U/L   Alkaline Phosphatase 143 (H) 38 - 126 U/L   Total Bilirubin 0.9 0.3 - 1.2 mg/dL   GFR, Estimated >60 >60 mL/min    Comment: (NOTE) Calculated using the CKD-EPI Creatinine Equation (2021)    Anion gap 12 5 - 15    Comment: Performed at [facility], [place] [address] [place], [place] [address]  Lipase, blood     Status: None   Collection Time: 03/06/21 10:37 AM  Result Value Ref Range   Lipase 23 11 - 51 U/L    Comment: Performed at [facility], [place] [address] [place], [place] [address]   CT Abdomen Pelvis W Contrast  Result Date: 03/06/2021 CLINICAL DATA:  Abdominal pain. Remote history of gastric bypass surgery. Recent endoscopy and biopsy. EXAM: CT ABDOMEN AND PELVIS WITH CONTRAST TECHNIQUE: Multidetector CT imaging of the abdomen and pelvis was performed using the standard protocol following bolus administration of intravenous contrast. CONTRAST:  18mL OMNIPAQUE IOHEXOL 350 MG/ML SOLN COMPARISON:  Recent CT scan 02/17/2021 FINDINGS: Lower chest: There are very small pleural effusions and bibasilar atelectasis. The heart is normal in size. Age advanced LAD calcifications are noted. Hepatobiliary: No hepatic lesions or intrahepatic biliary dilatation. I am unable to identify the gallbladder. It was present on the prior CT scan and there were several small layering gallstones noted. There is a large complex right upper quadrant abscess. Perforated cholecystitis with abscess is certainly a consideration. Normal caliber and course of the common bile duct. Pancreas: Mild mass effect on the pancreatic head by the large  right upper quadrant abscess no mass, inflammation or ductal dilatation. Spleen: Normal size. Stable splenic cysts. Adrenals/Urinary Tract: The adrenal glands and kidneys are unremarkable and stable. Small renal calculi again noted. The bladder is unremarkable. Stomach/Bowel: Surgical changes from gastric bypass surgery. I do not  see any obvious complicating features associated with the bypass. Contrast gets all the way to the colon without evidence of obstruction. There is moderate inflammation involving the hepatic flexure region of the colon but this is due to the surrounding abscess. Vascular/Lymphatic: The aorta and branch vessels are patent. Stable atherosclerotic calcifications. Borderline enlarged mesenteric and retroperitoneal lymph nodes are likely reactive. Reproductive: Surgically absent. Other: There is a large complex multifaceted right upper quadrant abscess measuring a maximum of 20 cm. Given its location and appearance I think it is most likely walled-off perforated cholecystitis. Musculoskeletal: No significant bony findings. Moderate degenerative changes involving the spine. IMPRESSION: 1. 20 cm complex right upper quadrant abscess. I think it is most likely walled-off perforated cholecystitis. 2. Surgical changes from gastric bypass surgery. No obvious complicating features associated with the bypass surgery. 3. Small pleural effusions and bibasilar atelectasis. 4. Age advanced LAD calcifications. These results were called by telephone at the time of interpretation on 03/06/2021 at 9:09 am to provider Silvano Rusk , who verbally acknowledged these results. Electronically Signed   By: Marijo Sanes M.D.   On: 03/06/2021 09:12    Anti-infectives (From admission, onward)    Start     Dose/Rate Route Frequency Ordered Stop   03/06/21 1400  piperacillin-tazobactam (ZOSYN) IVPB 3.375 g        3.375 g 12.5 mL/hr over 240 Minutes Intravenous  Once 03/06/21 1349          Assessment/Plan RUQ  abscess - CT scan today shows a 20cm complex RUQ abscess, likely walled-off perforated cholecystitis - Recommend hospital admission with IR consult for percutaneous drain placement. Surgery right now would be very difficult. Hopefully we can treat with drain and antibiotics and plan for delayed cholecystectomy.  We will follow.  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 Lucia Gaskins, known non-bleeding marginal ulcer  CAD PMH endometrial cancer s/p TAH 2006   [place], [facility] Surgery Please see Amion for pager number during day hours 7:00am-4:30pm 03/06/2021, 1:28 PM

## 2021-03-06 NOTE — ED Provider Notes (Addendum)
Emergency Medicine Provider Triage Evaluation Note  Sandra [name] , a 60 y.o. female  was evaluated in triage.  Pt complains of Abd pain. Was in CT at [facility] which showed liver abscess. GI attempted to direct admit however no beds. Felt warm however no fevers. No emesis, urine changes. Was admitted end of Oct for abd pain and dc home.  CT IMPRESSION: 1. 20 cm complex right upper quadrant abscess. I think it is most likely walled-off perforated cholecystitis.   Dr. Carlean Purl- GI   Review of Systems  Positive: Abd pain, abnormal CT Negative: Fever, emesis  Physical Exam  BP 117/74 (BP Location: Right Arm)   Pulse 90   Temp 99.9 F (37.7 C) (Oral)   Resp 16   SpO2 97%  Gen:   Awake, no distress   Resp:  Normal effort  MSK:   Moves extremities without difficulty  ABD:  Distended, tender Other:    Medical Decision Making  Medically screening exam initiated at 10:32 AM.  Appropriate orders placed.  Merryn Jinger Middlesworth was informed that the remainder of the evaluation will be completed by another provider, this initial triage assessment does not replace that evaluation, and the importance of remaining in the ED until their evaluation is complete.  Abnormal CT      Dimple Bastyr A, PA-C 03/06/21 1035    Kommor, Debe Coder, MD 03/06/21 1205

## 2021-03-06 NOTE — Consult Note (Signed)
Chief Complaint: Patient was seen in consultation today for image guided percutaneous cholecystostomy tube placement at the request of Henderly, Britni PA-C (EDP)  Referring Physician(s): Henderly, Britni PA-C (EDP)  Supervising Physician: Ruthann Cancer  Patient Status: [facility] - ED  History of Present Illness: Siera Damiana Berrian is a 60 y.o. female with PMHs of HTN, HLD, CAD, DM, endometrial CA s/p TAH in 2006, s/p roux-en-y in 2015, recent hospitalization 10/23-25 s/p EGD with bx of non bleeding gastric ulcer, who was seen by GI due to abdominal pain, CT A/P w today showed:  1. 20 cm complex right upper quadrant abscess. I think it is most likely walled-off perforated cholecystitis. 2. Surgical changes from gastric bypass surgery. No obvious complicating features associated with the bypass surgery. 3. Small pleural effusions and bibasilar atelectasis. 4. Age advanced LAD calcifications.  Patient was sent to [facility] ED for further management and evaluation, labs notable for worsening of leukocytosis, anemia, thrombocytopenia and elevated alk phos. Vitals stable, afebrile.   EDP contacted Dr. Serafina Royals, IR was requested for image guided percutaneous cholecystostomy tube placement.  Case was reviewed and approve by Dr. Serafina Royals.   Patient laying in bed, not in acute distress.  Reports abdominal pain.  Denise headache, fever, chills, shortness of breath, cough, chest pain,  nausea ,vomiting, and bleeding.    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 HYSTERECTOMY   STAGE I ENDOMETROID CARCINOMA   Hx of adenomatous colonic polyps 2011   BENIGN   Hyperlipidemia    no meds now   Hypertension    Sleep apnea    no CPAP since surgery    Past Surgical History:  Procedure Laterality Date   ABDOMINAL HYSTERECTOMY  12-20-2004   TAH   BILATERAL BREAST REDUCTION  11-16-1999   BIOPSY  02/20/2021   Procedure: BIOPSY;  Surgeon: Daryel November, MD;  Location: [facility] ENDOSCOPY;  Service: Gastroenterology;;   Rosanne Gutting PYLORI N/A 05/27/2013   Procedure: Lauris Chroman;  Surgeon: Shann Medal, MD;  Location: [facility];  Service: General;  Laterality: N/A;   [place] N/A 04/25/2014   Procedure: COLONOSCOPY;  Surgeon: Gatha Mayer, MD;  Location: [facility] ENDOSCOPY;  Service: Endoscopy;  Laterality: N/A;   COLONOSCOPY WITH PROPOFOL N/A 07/26/2019   Procedure: COLONOSCOPY WITH PROPOFOL;  Surgeon: Gatha Mayer, MD;  Location: [facility] ENDOSCOPY;  Service: Endoscopy;  Laterality: N/A;   CYSTOSCOPY  09/06/2011   Procedure: CYSTOSCOPY;  Surgeon: Ailene Rud, MD;  Location: [facility];  Service: Urology;  Laterality: N/A;  LYNX SLING    ESOPHAGOGASTRODUODENOSCOPY (EGD) WITH  PROPOFOL N/A 09/27/2014   Procedure: ESOPHAGOGASTRODUODENOSCOPY (EGD) WITH PROPOFOL;  Surgeon: Gatha Mayer, MD;  Location: [facility] ENDOSCOPY;  Service: Endoscopy;  Laterality: N/A;   ESOPHAGOGASTRODUODENOSCOPY (EGD) WITH PROPOFOL N/A 03/11/2016   Procedure: ESOPHAGOGASTRODUODENOSCOPY (EGD) WITH PROPOFOL;  Surgeon: Jerene Bears, MD;  Location: [facility] ENDOSCOPY;  Service: Endoscopy;  Laterality: N/A;   ESOPHAGOGASTRODUODENOSCOPY (EGD) WITH PROPOFOL Left 02/20/2021   Procedure:  ESOPHAGOGASTRODUODENOSCOPY (EGD) WITH PROPOFOL;  Surgeon: Daryel November, MD;  Location: [place];  Service: Gastroenterology;  Laterality: Left;   GASTRIC BYPASS  10/04/2013   GASTRIC ROUX-EN-Y N/A 10/04/2013   Procedure: LAPAROSCOPIC ROUX-EN-Y GASTRIC BYPASS WITH UPPER ENDOSCOPY;  Surgeon: Shann Medal, MD;  Location: [facility] ORS;  Service: General;  Laterality: N/A;   HEMORRHOID BANDING  02/2013   POLYPECTOMY     POLYPECTOMY  07/26/2019   Procedure: POLYPECTOMY;  Surgeon: Gatha Mayer, MD;  Location: [facility] ENDOSCOPY;  Service: Endoscopy;;   PUBOVAGINAL SLING  09/06/2011   Procedure: Gaynelle Arabian;  Surgeon: Ailene Rud, MD;  Location: [facility];  Service: Urology;  Laterality: N/A;   REDUCTION MAMMAPLASTY Bilateral 2001   SHOULDER ARTHROSCOPY Right 10/23/2017   Procedure: RIGHT SHOULDER ARTHROSCOPIC ROTATOR CUFF REPAIR VS. DEBRIDEMENT, SUBACROMIAL DECOMPARESSION, DISTAL CLAVICAL EXCISION;  Surgeon: Tania Ade, MD;  Location: [place];  Service: Orthopedics;  Laterality: Right;   UVULOPALATOPHARYNGOPLASTY, TONSILLECTOMY AND SEPTOPLASTY  1994    Allergies: Nsaids and Statins  Medications: Prior to Admission medications   Medication Sig Start Date End Date Taking? Authorizing Provider  albuterol (VENTOLIN HFA) 108 (90 Base) MCG/ACT inhaler Inhale 1-2 puffs into the lungs every 4 (four) hours as needed for wheezing or shortness of breath. 01/10/20   Harris, Abigail, PA-C  gabapentin (NEURONTIN) 300 MG capsule TAKE 1 CAPSULE BY MOUTH 3 TIMES DAILY. Patient taking differently: Take 300 mg by mouth at bedtime. 06/19/20 06/19/21  Lyndal Pulley, DO  glucose blood (FREESTYLE LITE) test strip Use daily as instructed. 01/05/21   [name], MD  Multiple Vitamins-Minerals (MULTIVITAMIN PO) Take 2 tablets by mouth daily.     [provider]  omeprazole (PRILOSEC) 40 MG capsule Take 1 capsule by mouth 2 times daily before a meal. Open capsule and sprinkle  in water, other liquid or applesauce 02/27/21   Gatha Mayer, MD  ondansetron (ZOFRAN-ODT) 4 MG disintegrating tablet DISSOLVE 1 TABLET BY MOUTH EVERY 8 HOURS AS NEEDED FOR NAUSEA OR VOMITING (MAY TAKE 2 TABLETS IF 1 TABLET IS INEFFECTIVE) Patient taking differently: Take 4-8 mg by mouth every 8 (eight) hours as needed for nausea or vomiting. 06/26/20 06/26/21  Gatha Mayer, MD  oxyCODONE (OXY IR/ROXICODONE) 5 MG immediate release tablet Take 1 tablet (5 mg total) by mouth every 4 (four) hours as needed for up to 5 days. 03/05/21 03/10/21  Gatha Mayer, MD  polyethylene glycol powder (MIRALAX) 17 GM/SCOOP powder Take 17 g by mouth daily. 02/20/21   Kayleen Memos, DO  sucralfate (CARAFATE) 1 g tablet Take 1 tablet (1 g total) by mouth 4 (four) times daily -  with meals and at bedtime for 14 days. 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[employer]   Social Determinants of Health   Financial Resource Strain: Not on file  Food Insecurity: Not on file  Transportation Needs: Not on file  Physical Activity: Not on file  Stress: Not on file  Social Connections: Not  on file     Review of Systems: A 12 point ROS discussed and pertinent positives are indicated in the HPI above.  All other systems are negative.  Vital Signs: BP 124/85 (BP Location: Right Arm)   Pulse 88   Temp 97.7 F (36.5 C) (Oral)   Resp (!) 21   SpO2 98%    Physical Exam Vitals reviewed.  Constitutional:      General: She is not in acute distress.    Appearance: She is ill-appearing.  HENT:     Head: Normocephalic and atraumatic.     Mouth/Throat:     Mouth: Mucous membranes are dry.  Cardiovascular:     Rate and Rhythm: Normal rate and regular rhythm.     Heart sounds: Normal heart sounds.  Pulmonary:     Effort: Pulmonary effort is normal.     Breath sounds: Normal breath sounds.  Abdominal:     General: Abdomen is flat. Bowel sounds are normal.     Palpations: Abdomen is soft.  Musculoskeletal:     Cervical back: Normal range of motion and neck supple.  Skin:    General: Skin is warm and dry.     Coloration: Skin is not jaundiced or pale.  Neurological:     Mental Status: She is alert and oriented to person, place, and time.  Psychiatric:        Mood and Affect: Mood normal.        Behavior: Behavior normal.        Judgment: Judgment normal.    MD Evaluation Airway: WNL Heart: WNL Abdomen: WNL Chest/ Lungs: WNL ASA  Classification: 3 Mallampati/Airway Score: Two  Imaging: CT Abdomen Pelvis W Contrast  Result Date: 03/06/2021 CLINICAL DATA:  Abdominal pain. Remote history of gastric bypass surgery. Recent endoscopy and biopsy. EXAM: CT ABDOMEN AND PELVIS WITH CONTRAST TECHNIQUE: Multidetector CT imaging of the abdomen and pelvis was performed using the standard protocol following bolus administration of intravenous contrast. CONTRAST:  74m OMNIPAQUE IOHEXOL 350 MG/ML SOLN COMPARISON:  Recent CT scan 02/17/2021 FINDINGS: Lower chest: There are very small pleural effusions and bibasilar atelectasis. The heart is normal in size. Age advanced LAD  calcifications are noted. Hepatobiliary: No hepatic lesions or intrahepatic biliary dilatation. I am unable to identify the gallbladder. It was present on the prior CT scan and there were several small layering gallstones noted. There is a large complex right upper quadrant abscess. Perforated cholecystitis with abscess is certainly a consideration. Normal caliber and course of the common bile duct. Pancreas: Mild mass effect on the pancreatic  head by the large right upper quadrant abscess no mass, inflammation or ductal dilatation. Spleen: Normal size. Stable splenic cysts. Adrenals/Urinary Tract: The adrenal glands and kidneys are unremarkable and stable. Small renal calculi again noted. The bladder is unremarkable. Stomach/Bowel: Surgical changes from gastric bypass surgery. I do not see any obvious complicating features associated with the bypass. Contrast gets all the way to the colon without evidence of obstruction. There is moderate inflammation involving the hepatic flexure region of the colon but this is due to the surrounding abscess. Vascular/Lymphatic: The aorta and branch vessels are patent. Stable atherosclerotic calcifications. Borderline enlarged mesenteric and retroperitoneal lymph nodes are likely reactive. Reproductive: Surgically absent. Other: There is a large complex multifaceted right upper quadrant abscess measuring a maximum of 20 cm. Given its location and appearance I think it is most likely walled-off perforated cholecystitis. Musculoskeletal: No significant bony findings. Moderate degenerative changes involving the spine. IMPRESSION: 1. 20 cm complex right upper quadrant abscess. I think it is most likely walled-off perforated cholecystitis. 2. Surgical changes from gastric bypass surgery. No obvious complicating features associated with the bypass surgery. 3. Small pleural effusions and bibasilar atelectasis. 4. Age advanced LAD calcifications. These results were called by telephone at  the time of interpretation on 03/06/2021 at 9:09 am to provider Silvano Rusk , who verbally acknowledged these results. Electronically Signed   By: Marijo Sanes M.D.   On: 03/06/2021 09:12   CT Abdomen Pelvis W Contrast  Result Date: 02/17/2021 CLINICAL DATA:  Bowel obstruction suspected Abdominal pain.  Episode of vomiting. EXAM: CT ABDOMEN AND PELVIS WITH CONTRAST TECHNIQUE: Multidetector CT imaging of the abdomen and pelvis was performed using the standard protocol following bolus administration of intravenous contrast. CONTRAST:  152m OMNIPAQUE IOHEXOL 300 MG/ML  SOLN COMPARISON:  Noncontrast CT 08/19/2014 FINDINGS: Lower chest: No acute airspace disease or pleural effusion. Heart size normal. Hepatobiliary: No focal hepatic lesion. Layering stones or sludge in the gallbladder which is mildly distended. There is slight indistinctness of the pericholecystic fat. No visualized choledocholithiasis. Upper normal common bile duct measuring 7 mm at the porta hepatis. Pancreas: No ductal dilatation or inflammation. Single calcification in the pancreatic body. Spleen: Normal in size. Lobulated hypodensity in the periphery of the inferior spleen, likely a hemangioma. Adrenals/Urinary Tract: Slight adrenal thickening without dominant nodule. Small nonobstructing stone in the lower right kidney. Punctate nonobstructing stone in the mid lower left kidney. No hydronephrosis. Homogeneous renal enhancement. Symmetric excretion on delayed phase imaging. Tiny cortical hypodensities in both kidneys are too small to characterize but likely small cyst. Partially distended urinary bladder with wall thickening at the dome. Stomach/Bowel: Mild wall thickening of the distal esophagus. Gastric bypass anatomy. Minimal fluid in the excluded gastric remnant. The Roux limb is nondilated, but redundant. Jejunal anastomosis is minimally patulous. There is no bowel obstruction, inflammation, pneumatosis or ischemia. Few fluid-filled  loops of small bowel in the pelvis. Normal appendix. Small volume of formed stool in the colon. The sigmoid colon is redundant. No colonic inflammation. Vascular/Lymphatic: Mild aortic atherosclerosis. No aortic aneurysm. Patent portal vein. No enlarged lymph nodes in the abdomen or pelvis. Reproductive: Hysterectomy.  No adnexal mass. Other: No free air, free fluid or focal fluid collection. No abdominal wall hernia. Musculoskeletal: Lower lumbar facet hypertrophy. Grade 1 anterolisthesis of L4 on L5 is likely facet mediated. There are no acute or suspicious osseous abnormalities. Incidental bone island in the pelvis. IMPRESSION: 1. Gastric bypass anatomy without bowel obstruction or inflammation. The Roux limb is  redundant. 2. Layering stones or sludge in the gallbladder which is mildly distended. There is slight indistinctness of the pericholecystic fat, which can be seen with acute cholecystitis in the appropriate clinical setting. Recommend right upper quadrant ultrasound for further evaluation. 3. Bilateral nonobstructing renal calculi. 4. Mild wall thickening of the distal esophagus, can be seen with reflux or esophagitis. 5. Urinary bladder wall thickening at the dome, recommend correlation with urinalysis to exclude urinary tract infection. Aortic Atherosclerosis (ICD10-I70.0). Electronically Signed   By: Keith Rake M.D.   On: 02/17/2021 21:51   NM Hepato W/EF  Result Date: 02/19/2021 CLINICAL DATA:  Right upper quadrant pain. Assess for cholecystitis. EXAM: NUCLEAR MEDICINE HEPATOBILIARY IMAGING WITH GALLBLADDER EF TECHNIQUE: Sequential images of the abdomen were obtained out to 60 minutes following intravenous administration of radiopharmaceutical. After slow intravenous infusion of 1.77 micrograms Cholecystokinin, gallbladder ejection fraction was determined. RADIOPHARMACEUTICALS:  5.5 mCi Tc-79m Choletec IV COMPARISON:  Abdominal sonogram 02/17/2021 FINDINGS: Prompt uptake and biliary  excretion of activity by the liver is seen. Biliary activity passes into small bowel, consistent with patent common bile duct. After 90 minutes of imaging no gallbladder activity was visualized. The patient received 2.7 mg of morphine the IV infusion. Following the IV administration of morphine the gallbladder was visualized. Calculated gallbladder ejection fraction is 45.7%. (At 60 min, normal ejection fraction is greater than 40%.) IMPRESSION: 1. Patent cystic duct without evidence for acute cholecystitis. 2. Normal gallbladder ejection fraction at 45.7%. Electronically Signed   By: TKerby MoorsM.D.   On: 02/19/2021 13:11   UKoreaAbdomen Limited RUQ (LIVER/GB)  Result Date: 02/17/2021 CLINICAL DATA:  Right upper quadrant pain and right flank pain. EXAM: ULTRASOUND ABDOMEN LIMITED RIGHT UPPER QUADRANT COMPARISON:  Abdomen and pelvis CT, dated February 17, 2021 FINDINGS: Gallbladder: Echogenic sludge and shadowing echogenic gallstones are seen within the distended gallbladder. The largest measures approximately 1.1 cm x 0.6 cm x 0.7 cm. A 0.5 cm x 0.5 cm x 0.6 cm gallstone is seen within the neck of the gallbladder. There is no evidence of gallbladder wall thickening (2.1 mm). No sonographic Murphy sign noted by sonographer. Common bile duct: Diameter: 6.6 mm Liver: No focal lesion identified. Within normal limits in parenchymal echogenicity. Portal vein is patent on color Doppler imaging with normal direction of blood flow towards the liver. Other: None. IMPRESSION: Cholelithiasis and gallbladder sludge, without evidence of acute cholecystitis. Further evaluation with a nuclear medicine hepatobiliary scan is recommended if this remains of clinical concern. Electronically Signed   By: TVirgina NorfolkM.D.   On: 02/17/2021 22:58    Labs:  CBC: Recent Labs    02/19/21 0219 02/20/21 0616 03/05/21 1426 03/06/21 1037  WBC 15.9* 11.1* 14.4* 14.7*  HGB 14.1 13.0 12.1 11.0*  HCT 44.1 40.5 36.5 33.1*  PLT  218 192 701.0* 634*    COAGS: No results for input(s): INR, APTT in the last 8760 hours.  BMP: Recent Labs    02/17/21 2052 02/19/21 0219 02/20/21 0616 03/05/21 1426 03/06/21 1037  NA 141 135 134* 136 131*  K 3.9 4.0 3.7 3.2* 3.0*  CL 102 100 97* 92* 90*  CO2 _0 34* 29  GLUCOSE 174* 163* 144* 157* 155*  BUN 24* _1 CALCIUM 10.0 9.2 8.9 8.9 8.3*  CREATININE 0.65 0.63 0.57 0.40 0.35*  GFRNONAA >60 >60 >60  --  >60    LIVER FUNCTION TESTS: Recent Labs    02/17/21 2052 02/20/21 05643  03/05/21 1426 03/06/21 1037  BILITOT 0.3 0.9 0.7 0.9  AST _0 ALT _1 ALKPHOS 49 60 150* 143*  PROT 7.3 5.9* 6.8 6.4*  ALBUMIN 4.9 2.6* 3.1* 2.3*    TUMOR MARKERS: No results for input(s): AFPTM, CEA, CA199, CHROMGRNA in the last 8760 hours.  Assessment and Plan: 60 y.o. female with 20 cm complex right upper quadrant abscess, most likely walled-off perforated cholecystitis seen on CT today.   IR was requested for image guided percutaneous cholecystostomy tube placement.  Case was reviewed and approve by Dr. Serafina Royals.   NPO since 7 am today per patient  VSS WBC 14.7, hgb 11.0, plt 634 Not on AC/AP as an out pt   Risks and benefits discussed with the patient including, but not limited to bleeding, infection, gallbladder perforation, bile leak, or sepsis.   All of the patient's questions were answered, patient is agreeable to proceed. Consent signed and in IR.    Thank you for this interesting consult.  I greatly enjoyed meeting Jacie Lidya Mccalister and look forward to participating in their care.  A copy of this report was sent to the requesting provider on this date.  Electronically Signed: Tera Mater, PA-C 03/06/2021, 2:30 PM   I spent a total of  30 Minutes   in face to face in clinical consultation, greater than 50% of which was counseling/coordinating care for perc chole tube placement.   This chart was dictated using voice recognition  software.  Despite best efforts to proofread,  errors can occur which can change the documentation meaning.

## 2021-03-06 NOTE — H&P (Signed)
History and Physical    Merci Walthers WCB:[id number] DOB: 1960-12-30 DOA: 03/06/2021  PCP: [name], MD   Patient coming from: Home.  I have personally briefly reviewed patient's old medical records in [place]  Chief Complaint: Abdominal pain.  HPI: Sandra [name] is a 60 y.o. female with medical history significant of anxiety, depression, osteoarthritis, CAD, type II DM diet-controlled, gastrojejunal ulcer, GERD, history of gastric bypass, hemorrhoids, hyperlipidemia, hypertension, sleep apnea no longer on CPAP after gastric bypass surgery who was admitted and discharged from 02/17/2021 until 02/20/2021 due to right upper quadrant pain associated with nausea with negative work-up.  She continues to have pain after being discharged and she called Dr. Carlean Purl who ordered a CT abdomen/pelvis which now shows a RUQ abscess.  She denied fever, chills, but feels fatigue and malaise.  Her appetite is minimal.  She has had trouble sleeping because of this.  She denies melena or hematochezia.  No flank pain, dysuria, frequency, but stated she stated she had an episode of hematuria this morning.    ED Course: Initial vital signs were temperature 99.9 F, pulse 90, respirations 16, BP 117/74 mmHg O2 sat 97% on room air.  The patient received NS 1000 mL IV bolus, LR 1000 mL IV bolus morphine 4 mg IVP, ondansetron 4 mg IVP and was started on Zosyn IV.  IR was consulted.  Lab work: CBC is her white count of 14.7 with 77% neutrophils, hemoglobin 11.0 g/dL platelets 634.  CMP showed a sodium 131, potassium 3.0 and chloride 90 mmol/L.  CO2 was normal as well Actros calcium when corrected to albumin.  Renal function was excellent.  Total protein 6.4 and albumin 2.3 g/dL.  Alkaline phosphatase slightly elevated 143 units/L.  Lipase was normal.  Magnesium 1.9 phosphorus 3.6 mg/dL.  Imaging: CT abdomen/pelvis with contrast show RUQ abscess.  Please see images and full radiology report for  further details.  Review of Systems: As per HPI otherwise all other systems reviewed and are negative.  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 HYSTERECTOMY   STAGE I ENDOMETROID CARCINOMA   Hx of adenomatous colonic polyps 2011   BENIGN   Hyperlipidemia    no meds now   Hypertension    Sleep apnea    no CPAP since surgery   Past Surgical History:  Procedure Laterality Date   ABDOMINAL HYSTERECTOMY  12-20-2004   TAH   BILATERAL BREAST REDUCTION  11-16-1999   BIOPSY  02/20/2021   Procedure: BIOPSY;  Surgeon: Daryel November, MD;  Location: [facility] ENDOSCOPY;  Service: Gastroenterology;;   Fidel Levy H PYLORI N/A 05/27/2013   Procedure: BREATH TEK H PYLORI;  Surgeon: Shann Medal, MD;  Location: [facility] ENDOSCOPY;  Service: General;  Laterality: N/A;   [place] N/A 04/25/2014   Procedure: COLONOSCOPY;  Surgeon: Gatha Mayer, MD;  Location: [facility] ENDOSCOPY;  Service: Endoscopy;  Laterality: N/A;   COLONOSCOPY WITH PROPOFOL N/A 07/26/2019   Procedure: COLONOSCOPY WITH PROPOFOL;  Surgeon: Gatha Mayer, MD;  Location: [facility] ENDOSCOPY;  Service: Endoscopy;  Laterality: N/A;   CYSTOSCOPY  09/06/2011   Procedure: CYSTOSCOPY;  Surgeon: Ailene Rud, MD;  Location: [facility];  Service: Urology;  Laterality: N/A;  LYNX SLING    ESOPHAGOGASTRODUODENOSCOPY (EGD) WITH PROPOFOL N/A 09/27/2014   Procedure: ESOPHAGOGASTRODUODENOSCOPY (EGD) WITH PROPOFOL;  Surgeon: Gatha Mayer, MD;  Location: [facility] ENDOSCOPY;  Service: Endoscopy;  Laterality: N/A;   ESOPHAGOGASTRODUODENOSCOPY (EGD) WITH PROPOFOL N/A 03/11/2016   Procedure: ESOPHAGOGASTRODUODENOSCOPY (EGD) WITH PROPOFOL;  Surgeon: Jerene Bears, MD;  Location: [facility] ENDOSCOPY;  Service: Endoscopy;  Laterality: N/A;   ESOPHAGOGASTRODUODENOSCOPY (EGD) WITH PROPOFOL Left 02/20/2021   Procedure: ESOPHAGOGASTRODUODENOSCOPY (EGD) WITH PROPOFOL;  Surgeon: Daryel November, MD;  Location: [facility];  Service: Gastroenterology;  Laterality: Left;   GASTRIC BYPASS  10/04/2013   GASTRIC ROUX-EN-Y N/A 10/04/2013   Procedure: LAPAROSCOPIC ROUX-EN-Y GASTRIC BYPASS WITH UPPER ENDOSCOPY;  Surgeon: Shann Medal, MD;  Location: [facility] ORS;  Service: General;  Laterality: N/A;   HEMORRHOID BANDING  02/2013   POLYPECTOMY     POLYPECTOMY  07/26/2019   Procedure: POLYPECTOMY;  Surgeon: Gatha Mayer, MD;  Location: [facility] ENDOSCOPY;  Service: Endoscopy;;   PUBOVAGINAL SLING  09/06/2011   Procedure: Gaynelle Arabian;  Surgeon: Ailene Rud, MD;  Location: [facility];  Service: Urology;  Laterality: N/A;   REDUCTION MAMMAPLASTY Bilateral 2001   SHOULDER ARTHROSCOPY Right 10/23/2017   Procedure: RIGHT SHOULDER ARTHROSCOPIC ROTATOR CUFF REPAIR VS. DEBRIDEMENT, SUBACROMIAL DECOMPARESSION, DISTAL CLAVICAL EXCISION;  Surgeon: Tania Ade, MD;  Location: [place];  Service: Orthopedics;  Laterality: Right;   UVULOPALATOPHARYNGOPLASTY, TONSILLECTOMY AND SEPTOPLASTY  1994   Social History  reports that she has never smoked. She has never used smokeless tobacco. She reports that she does not drink alcohol and does not use drugs.  Allergies  Allergen Reactions   Nsaids     r/t gastric surgery   Statins    Family History   Adopted: Yes   Prior to Admission medications   Medication Sig Start Date End Date Taking? Authorizing Provider  albuterol (VENTOLIN HFA) 108 (90 Base) MCG/ACT inhaler Inhale 1-2 puffs into the lungs every 4 (four) hours as needed for wheezing or shortness of breath. 01/10/20   Harris, Abigail, PA-C  gabapentin (NEURONTIN) 300 MG capsule TAKE 1 CAPSULE BY MOUTH 3 TIMES DAILY. Patient taking differently: Take 300 mg by mouth at bedtime. 06/19/20 06/19/21  Lyndal Pulley, DO  glucose blood (FREESTYLE LITE) test strip Use daily as instructed. 01/05/21   [name], MD  Multiple Vitamins-Minerals (MULTIVITAMIN PO) Take 2 tablets by mouth daily.     [provider]  omeprazole (PRILOSEC) 40 MG capsule Take 1 capsule by mouth 2 times daily before a meal. Open capsule and sprinkle in water, other liquid or applesauce 02/27/21   Gatha Mayer, MD  ondansetron (ZOFRAN-ODT) 4 MG disintegrating tablet DISSOLVE 1 TABLET BY MOUTH EVERY 8 HOURS AS NEEDED FOR NAUSEA OR VOMITING (MAY TAKE 2 TABLETS IF 1 TABLET IS INEFFECTIVE) Patient taking differently: Take 4-8 mg  by mouth every 8 (eight) hours as needed for nausea or vomiting. 06/26/20 06/26/21  Gatha Mayer, MD  oxyCODONE (OXY IR/ROXICODONE) 5 MG immediate release tablet Take 1 tablet (5 mg total) by mouth every 4 (four) hours as needed for up to 5 days. 03/05/21 03/10/21  Gatha Mayer, MD  polyethylene glycol powder (MIRALAX) 17 GM/SCOOP powder Take 17 g by mouth daily. 02/20/21   Kayleen Memos, DO  sucralfate (CARAFATE) 1 g tablet Take 1 tablet (1 g total) by mouth 4 (four) times daily -  with meals and at bedtime for 14 days. 02/20/21 03/06/21  Kayleen Memos, DO   Physical Exam: Vitals:   03/06/21 1019 03/06/21 1311 03/06/21 1345 03/06/21 1500  BP: 117/74 108/69 124/85 114/66  Pulse: 90 84 88 81  Resp: 16 18 (!) 21 16  Temp: 99.9 F (37.7 C)  97.7 F (36.5 C)   TempSrc: Oral  Oral   SpO2: 97% 97% 98% 94%   Constitutional:  Chronically ill-appearing.  NAD, calm, comfortable Eyes: PERRL, lids and conjunctivae normal ENMT: Mucous membranes are dry.  Posterior pharynx clear of any exudate or lesions. Neck: normal, supple, no masses, no thyromegaly Respiratory: clear to auscultation bilaterally, no wheezing, no crackles. Normal respiratory effort. No accessory muscle use.  Cardiovascular: Regular rate and rhythm, no murmurs / rubs / gallops. No extremity edema. 2+ pedal pulses. No carotid bruits.  Abdomen: Mildly distended.  Soft, positive severe RUQ tenderness mild LUQ and mild RLQ tenderness, positive guarding, no masses palpated. No hepatosplenomegaly. Bowel sounds positive.  Musculoskeletal: no clubbing / cyanosis. Good ROM, no contractures. Normal muscle tone.  Skin: Decreased skin turgor. Neurologic: CN 2-12 grossly intact. Sensation intact, DTR normal. Strength 5/5 in all 4.  Psychiatric: Normal judgment and insight. Alert and oriented x 3. Normal mood.   Labs on Admission: I have personally reviewed following labs and imaging studies  CBC: Recent Labs  Lab 03/05/21 1426 03/06/21 1037  WBC 14.4* 14.7*  NEUTROABS  --  11.3*  HGB 12.1 11.0*  HCT 36.5 33.1*  MCV 91.6 91.7  PLT 701.0* 161*   Basic Metabolic Panel: Recent Labs  Lab 03/05/21 1426 03/06/21 1037  NA 136 131*  K 3.2* 3.0*  CL 92* 90*  CO2 34* 29  GLUCOSE 157* 155*  BUN 13 8  CREATININE 0.40 0.35*  CALCIUM 8.9 8.3*   GFR: CrCl cannot be calculated (Unknown ideal weight.).  Liver Function Tests: Recent Labs  Lab 03/05/21 1426 03/06/21 1037  AST 31 27  ALT 27 27  ALKPHOS 150* 143*  BILITOT 0.7 0.9  PROT 6.8 6.4*  ALBUMIN 3.1* 2.3*   Radiological Exams on Admission: CT Abdomen Pelvis W Contrast  Result Date: 03/06/2021 CLINICAL DATA:  Abdominal pain. Remote history of gastric bypass surgery. Recent endoscopy and biopsy. EXAM: CT ABDOMEN AND PELVIS WITH CONTRAST TECHNIQUE: Multidetector CT imaging of the abdomen and pelvis  was performed using the standard protocol following bolus administration of intravenous contrast. CONTRAST:  62mL OMNIPAQUE IOHEXOL 350 MG/ML SOLN COMPARISON:  Recent CT scan 02/17/2021 FINDINGS: Lower chest: There are very small pleural effusions and bibasilar atelectasis. The heart is normal in size. Age advanced LAD calcifications are noted. Hepatobiliary: No hepatic lesions or intrahepatic biliary dilatation. I am unable to identify the gallbladder. It was present on the prior CT scan and there were several small layering gallstones noted. There is a large complex right upper quadrant abscess. Perforated cholecystitis with abscess is certainly a consideration. Normal  caliber and course of the common bile duct. Pancreas: Mild mass effect on the pancreatic head by the large right upper quadrant abscess no mass, inflammation or ductal dilatation. Spleen: Normal size. Stable splenic cysts. Adrenals/Urinary Tract: The adrenal glands and kidneys are unremarkable and stable. Small renal calculi again noted. The bladder is unremarkable. Stomach/Bowel: Surgical changes from gastric bypass surgery. I do not see any obvious complicating features associated with the bypass. Contrast gets all the way to the colon without evidence of obstruction. There is moderate inflammation involving the hepatic flexure region of the colon but this is due to the surrounding abscess. Vascular/Lymphatic: The aorta and branch vessels are patent. Stable atherosclerotic calcifications. Borderline enlarged mesenteric and retroperitoneal lymph nodes are likely reactive. Reproductive: Surgically absent. Other: There is a large complex multifaceted right upper quadrant abscess measuring a maximum of 20 cm. Given its location and appearance I think it is most likely walled-off perforated cholecystitis. Musculoskeletal: No significant bony findings. Moderate degenerative changes involving the spine. IMPRESSION: 1. 20 cm complex right upper quadrant  abscess. I think it is most likely walled-off perforated cholecystitis. 2. Surgical changes from gastric bypass surgery. No obvious complicating features associated with the bypass surgery. 3. Small pleural effusions and bibasilar atelectasis. 4. Age advanced LAD calcifications. These results were called by telephone at the time of interpretation on 03/06/2021 at 9:09 am to provider Silvano Rusk , who verbally acknowledged these results. Electronically Signed   By: Marijo Sanes M.D.   On: 03/06/2021 09:12    EKG: Independently reviewed.  Vent. rate 88 BPM PR interval 133 ms QRS duration 81 ms QT/QTcB 346/419 ms P-R-T axes 70 72 0 Sinus rhythm Anteroseptal infarct, old Borderline repolarization abnormality  Assessment/Plan Principal Problem:   Intra-abdominal abscess (HCC) Observation/telemetry. Keep n.p.o. Continue IV Fluids. Continue Zosyn 3.375 g every 8 hours. Analgesics and antiemetics as needed. IR consult and procedure appreciated.  Active Problems:   Type 2 diabetes mellitus (HCC) Currently diet-controlled. CBG monitoring every 6 hours.    Hypercholesterolemia Not on medical therapy. Follow-up with PCP.    Coronary atherosclerosis of native coronary artery Asymptomatic at this time. Currently not on medication. Follow-up with PCP or cardiology.    Normocytic anemia Monitor H&H.    Hypokalemia Replacing. Magnesium was supplemented. Follow-up potassium level.    Hyponatremia Continue IV fluids. Follow-up sodium level.    Thrombocytosis Secondary to abscess. Follow platelet count.    Moderate protein-calorie malnutrition ([place]) Consult nutritional services once the patient is cleared for oral intake.   DVT prophylaxis: SCDs and Lovenox post IR procedure. Code Status:   Full code. Family Communication:   Disposition Plan:   Patient is from:  Home.  Anticipated DC to:  Home.  Anticipated DC date:  03/08/2021.  Anticipated DC barriers: Clinical  status.  Consults called:   Admission status:  Telemetry/observation.   Severity of Illness: High severity after presenting with persistent RUQ pain initially with negative work-up now showing RUQ abscess.  The patient will be admitted for 2 to 3 days for IV antibiotic therapy and IR drainage of the abscess.  Reubin Milan MD [facility]  How to contact the [facility] Attending or Consulting provider [place] or covering provider during after hours [place], for this patient?   Check the care team in [facility] and look for a) attending/consulting [facility] provider listed and b) the [facility] team listed Log into [email or web address] and use [place]'s universal password to access. If you do not have  the password, please contact the hospital operator. Locate the [facility] provider you are looking for under [facility] and page to a number that you can be directly reached. If you still have difficulty reaching the provider, please page the [facility] (Director on Call) for the Hospitalists listed on amion for assistance.  03/06/2021, 3:27 PM   This document was prepared with Dragon voice recognition software and may contain some unintended transcription errors.

## 2021-03-06 NOTE — Telephone Encounter (Signed)
Sending to ED No beds to do direct admit

## 2021-03-06 NOTE — ED Notes (Signed)
Female purewick placed on pt °

## 2021-03-06 NOTE — Procedures (Signed)
Interventional Radiology Procedure Note  Procedure: Cholecystostomy tube placement  Findings: Please refer to procedural dictation for full description. 10.2 Fr transhepatic cholecystostomy tube placed.  Approximately 1 L of purulent aspirate was obtained, sample sent for culture.  Placed to bulb suction.  Complications: None immediate  Estimated Blood Loss: < 5 mL  Recommendations: Keep to bulb suction until leukocytosis resolves, then place to bag drainage. Follow up cultures. IR will follow.   Ruthann Cancer, MD Pager: [phone]

## 2021-03-06 NOTE — ED Provider Notes (Signed)
[facility] DEPT Provider Note   CSN: [id number] Arrival date & time: 03/06/21  7829    History Abnormal CT   Sandra [name] is a 60 y.o. female past medical history significant for diet-controlled diabetes, CAD, prior gastric bypass who comes in for evaluation of abnormal CT and abdominal pain.  Was admitted end of October for similar pain.  Had thorough work-up which did not show any significant findings at that time.  Worsening pain lead her to call her GI specialist, Dr. Carlean Purl who ordered an outpatient CT scan. Upon CT scan today it was noted she had a large right upper quadrant abscess, sent here to the emergency department for further evaluation.  Patient states she has had some nausea without vomiting.  She has generalized upper abdominal pain which she states is aching.  Has had some chills at home without any fevers.  No back pain, chest pain, shortness of breath, cough, lower extremity swelling.  No change in bowel movements.  She is not on anticoagulation.  Rates her current pain a 5/10. Denies additional aggravating or alleviating factors.  History obtained from patient and past medical records. No interpretor was used.   Last p.o. 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 HYSTERECTOMY   STAGE I ENDOMETROID CARCINOMA   Hx of adenomatous colonic polyps 2011   BENIGN   Hyperlipidemia    no meds now   Hypertension    Sleep apnea    no CPAP since surgery    Patient Active Problem List   Diagnosis Date Noted   Intra-abdominal abscess ([place]) 03/06/2021   Abdominal pain 02/17/2021   Right carpal tunnel syndrome 11/10/2020   Labral tear of right hip joint 11/10/2020   Chronic right hip pain 04/10/2020   Nonallopathic lesion of sacral region 08/05/2019   Greater trochanteric bursitis, right 08/05/2019   Benign neoplasm of descending colon    Low back pain 05/27/2019   Hematoma of right hip 05/07/2019   Trigger finger, right index finger 02/17/2019   Anxiety 11/12/2018   Left lateral epicondylitis 05/14/2018   Left elbow pain 05/04/2018   Right rotator cuff tear 08/20/2017   Cervical disc disorder with radiculopathy of cervical region 04/25/2016   Nonallopathic lesion of cervical region 04/17/2016   Nonallopathic lesion of thoracic region 04/17/2016   Nonallopathic lesion of rib cage 04/17/2016   Nonallopathic lesion of lumbosacral region 04/17/2016   Degenerative cervical disc 03/29/2016   Degenerative arthritis of right knee 03/29/2016   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   Adjustment disorder with mixed anxiety and depressed mood 07/14/2014  Personal history of colonic polyps    History of Roux-en-Y gastric bypass, 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 ENDOSCOPY;  Service: Gastroenterology;;   Rosanne Gutting PYLORI N/A 05/27/2013   Procedure: Lauris Chroman;  Surgeon: Shann Medal, MD;  Location: [facility];  Service: General;  Laterality: N/A;   Johnson N/A 04/25/2014   Procedure: COLONOSCOPY;  Surgeon: Gatha Mayer, MD;  Location: [facility] ENDOSCOPY;  Service: Endoscopy;  Laterality: N/A;   COLONOSCOPY WITH PROPOFOL N/A 07/26/2019   Procedure: COLONOSCOPY WITH PROPOFOL;  Surgeon: Gatha Mayer, MD;  Location: [facility] ENDOSCOPY;  Service: Endoscopy;  Laterality: N/A;   CYSTOSCOPY  09/06/2011   Procedure: CYSTOSCOPY;  Surgeon: Ailene Rud, MD;  Location: [facility];  Service: Urology;  Laterality: N/A;  LYNX SLING    ESOPHAGOGASTRODUODENOSCOPY (EGD) WITH PROPOFOL N/A 09/27/2014   Procedure: ESOPHAGOGASTRODUODENOSCOPY (EGD) WITH PROPOFOL;  Surgeon: Gatha Mayer, MD;  Location: [facility] ENDOSCOPY;  Service: Endoscopy;  Laterality: N/A;   ESOPHAGOGASTRODUODENOSCOPY (EGD) WITH PROPOFOL N/A 03/11/2016   Procedure: ESOPHAGOGASTRODUODENOSCOPY (EGD) WITH PROPOFOL;  Surgeon: Jerene Bears, MD;  Location: [facility] ENDOSCOPY;  Service: Endoscopy;  Laterality: N/A;   ESOPHAGOGASTRODUODENOSCOPY (EGD) WITH PROPOFOL Left 02/20/2021   Procedure: ESOPHAGOGASTRODUODENOSCOPY (EGD) WITH PROPOFOL;  Surgeon: Daryel November, MD;  Location: [facility];  Service: Gastroenterology;  Laterality: Left;   GASTRIC BYPASS  10/04/2013   GASTRIC ROUX-EN-Y N/A 10/04/2013   Procedure: LAPAROSCOPIC ROUX-EN-Y GASTRIC BYPASS WITH UPPER ENDOSCOPY;  Surgeon: Shann Medal, MD;  Location: [facility] ORS;  Service: General;  Laterality: N/A;   HEMORRHOID BANDING  02/2013   POLYPECTOMY     POLYPECTOMY  07/26/2019   Procedure: POLYPECTOMY;  Surgeon: Gatha Mayer, MD;  Location: [facility] ENDOSCOPY;  Service: Endoscopy;;   PUBOVAGINAL SLING  09/06/2011   Procedure: Gaynelle Arabian;  Surgeon: Ailene Rud, MD;  Location:  [facility];  Service: Urology;  Laterality: N/A;   REDUCTION MAMMAPLASTY Bilateral 2001   SHOULDER ARTHROSCOPY Right 10/23/2017   Procedure: RIGHT SHOULDER ARTHROSCOPIC ROTATOR CUFF REPAIR VS. DEBRIDEMENT, SUBACROMIAL DECOMPARESSION, DISTAL CLAVICAL EXCISION;  Surgeon: Tania Ade, MD;  Location: [facility];  Service: Orthopedics;  Laterality: Right;   UVULOPALATOPHARYNGOPLASTY, TONSILLECTOMY AND SEPTOPLASTY  1994     OB History     Gravida  3   Para  2   Term      Preterm      AB  1   Living  2      SAB  1   IAB      Ectopic      Multiple      Live Births              Family History  Adopted: Yes    Social History   Tobacco Use   Smoking status: Never   Smokeless tobacco: Never  Vaping Use   Vaping Use: Never used  Substance Use Topics   Alcohol use: No  Alcohol/week: 0.0 standard drinks   Drug use: No    Home Medications Prior to Admission medications   Medication Sig Start Date End Date Taking? Authorizing Provider  albuterol (VENTOLIN HFA) 108 (90 Base) MCG/ACT inhaler Inhale 1-2 puffs into the lungs every 4 (four) hours as needed for wheezing or shortness of breath. 01/10/20   Harris, Abigail, PA-C  gabapentin (NEURONTIN) 300 MG capsule TAKE 1 CAPSULE BY MOUTH 3 TIMES DAILY. Patient taking differently: Take 300 mg by mouth at bedtime. 06/19/20 06/19/21  Lyndal Pulley, DO  glucose blood (FREESTYLE LITE) test strip Use daily as instructed. 01/05/21   [name], MD  Multiple Vitamins-Minerals (MULTIVITAMIN PO) Take 2 tablets by mouth daily.     [provider]  omeprazole (PRILOSEC) 40 MG capsule Take 1 capsule by mouth 2 times daily before a meal. Open capsule and sprinkle in water, other liquid or applesauce 02/27/21   Gatha Mayer, MD  ondansetron (ZOFRAN-ODT) 4 MG disintegrating tablet DISSOLVE 1 TABLET BY MOUTH EVERY 8 HOURS AS NEEDED FOR NAUSEA OR VOMITING (MAY TAKE 2 TABLETS IF 1 TABLET IS INEFFECTIVE) Patient  taking differently: Take 4-8 mg by mouth every 8 (eight) hours as needed for nausea or vomiting. 06/26/20 06/26/21  Gatha Mayer, MD  oxyCODONE (OXY IR/ROXICODONE) 5 MG immediate release tablet Take 1 tablet (5 mg total) by mouth every 4 (four) hours as needed for up to 5 days. 03/05/21 03/10/21  Gatha Mayer, MD  polyethylene glycol powder (MIRALAX) 17 GM/SCOOP powder Take 17 g by mouth daily. 02/20/21   Kayleen Memos, DO  sucralfate (CARAFATE) 1 g tablet Take 1 tablet (1 g total) by mouth 4 (four) times daily -  with meals and at bedtime for 14 days. 02/20/21 03/06/21  Kayleen Memos, DO    Allergies    Nsaids and Statins  Review of Systems   Review of Systems  Constitutional: Negative.   HENT: Negative.    Respiratory: Negative.    Cardiovascular: Negative.   Gastrointestinal:  Positive for abdominal pain and nausea. Negative for abdominal distention, anal bleeding, constipation, diarrhea, rectal pain and vomiting.  Genitourinary: Negative.   Musculoskeletal: Negative.   Skin: Negative.   Neurological: Negative.   All other systems reviewed and are negative.  Physical Exam Updated Vital Signs BP 124/85 (BP Location: Right Arm)   Pulse 88   Temp 97.7 F (36.5 C) (Oral)   Resp (!) 21   SpO2 98%   Physical Exam Vitals and nursing note reviewed.  Constitutional:      General: She is not in acute distress.    Appearance: She is well-developed. She is not ill-appearing, toxic-appearing or diaphoretic.  HENT:     Head: Normocephalic and atraumatic.     Nose: Nose normal.     Mouth/Throat:     Mouth: Mucous membranes are moist.  Eyes:     Pupils: Pupils are equal, round, and reactive to light.  Cardiovascular:     Rate and Rhythm: Normal rate.     Pulses: Normal pulses.          Radial pulses are 2+ on the right side and 2+ on the left side.       Dorsalis pedis pulses are 2+ on the right side and 2+ on the left side.     Heart sounds: Normal heart sounds.  Pulmonary:      Effort: Pulmonary effort is normal. No respiratory distress.     Breath sounds:  Normal breath sounds.  Abdominal:     General: Bowel sounds are normal. There is no distension.     Palpations: Abdomen is soft.     Tenderness: There is abdominal tenderness. There is guarding. There is no rebound.     Hernia: No hernia is present.     Comments: Diffuse tenderness to upper abdomen, worse to right upper quadrant with some mild guarding   Musculoskeletal:        General: Normal range of motion.     Cervical back: Normal range of motion.  Skin:    General: Skin is warm and dry.     Capillary Refill: Capillary refill takes less than 2 seconds.  Neurological:     General: No focal deficit present.     Mental Status: She is alert.  Psychiatric:        Mood and Affect: Mood normal.    ED Results / Procedures / Treatments   Labs (all labs ordered are listed, but only abnormal results are displayed) Labs Reviewed  CBC WITH DIFFERENTIAL/PLATELET - Abnormal; Notable for the following components:      Result Value   WBC 14.7 (*)    RBC 3.61 (*)    Hemoglobin 11.0 (*)    HCT 33.1 (*)    Platelets 634 (*)    Neutro Abs 11.3 (*)    Monocytes Absolute 1.3 (*)    Abs Immature Granulocytes 0.10 (*)    All other components within normal limits  COMPREHENSIVE METABOLIC PANEL - Abnormal; Notable for the following components:   Sodium 131 (*)    Potassium 3.0 (*)    Chloride 90 (*)    Glucose, Bld 155 (*)    Creatinine, Ser 0.35 (*)    Calcium 8.3 (*)    Total Protein 6.4 (*)    Albumin 2.3 (*)    Alkaline Phosphatase 143 (*)    All other components within normal limits  RESP PANEL BY RT-PCR (FLU A&B, COVID) ARPGX2  LIPASE, BLOOD  URINALYSIS, ROUTINE W REFLEX MICROSCOPIC    EKG None  Radiology CT Abdomen Pelvis W Contrast  Result Date: 03/06/2021 CLINICAL DATA:  Abdominal pain. Remote history of gastric bypass surgery. Recent endoscopy and biopsy. EXAM: CT ABDOMEN AND PELVIS WITH  CONTRAST TECHNIQUE: Multidetector CT imaging of the abdomen and pelvis was performed using the standard protocol following bolus administration of intravenous contrast. CONTRAST:  62m OMNIPAQUE IOHEXOL 350 MG/ML SOLN COMPARISON:  Recent CT scan 02/17/2021 FINDINGS: Lower chest: There are very small pleural effusions and bibasilar atelectasis. The heart is normal in size. Age advanced LAD calcifications are noted. Hepatobiliary: No hepatic lesions or intrahepatic biliary dilatation. I am unable to identify the gallbladder. It was present on the prior CT scan and there were several small layering gallstones noted. There is a large complex right upper quadrant abscess. Perforated cholecystitis with abscess is certainly a consideration. Normal caliber and course of the common bile duct. Pancreas: Mild mass effect on the pancreatic head by the large right upper quadrant abscess no mass, inflammation or ductal dilatation. Spleen: Normal size. Stable splenic cysts. Adrenals/Urinary Tract: The adrenal glands and kidneys are unremarkable and stable. Small renal calculi again noted. The bladder is unremarkable. Stomach/Bowel: Surgical changes from gastric bypass surgery. I do not see any obvious complicating features associated with the bypass. Contrast gets all the way to the colon without evidence of obstruction. There is moderate inflammation involving the hepatic flexure region of the colon but this is due  to the surrounding abscess. Vascular/Lymphatic: The aorta and branch vessels are patent. Stable atherosclerotic calcifications. Borderline enlarged mesenteric and retroperitoneal lymph nodes are likely reactive. Reproductive: Surgically absent. Other: There is a large complex multifaceted right upper quadrant abscess measuring a maximum of 20 cm. Given its location and appearance I think it is most likely walled-off perforated cholecystitis. Musculoskeletal: No significant bony findings. Moderate degenerative changes  involving the spine. IMPRESSION: 1. 20 cm complex right upper quadrant abscess. I think it is most likely walled-off perforated cholecystitis. 2. Surgical changes from gastric bypass surgery. No obvious complicating features associated with the bypass surgery. 3. Small pleural effusions and bibasilar atelectasis. 4. Age advanced LAD calcifications. These results were called by telephone at the time of interpretation on 03/06/2021 at 9:09 am to provider Silvano Rusk , who verbally acknowledged these results. Electronically Signed   By: Marijo Sanes M.D.   On: 03/06/2021 09:12    Procedures Procedures    Medications Ordered in ED Medications  piperacillin-tazobactam (ZOSYN) IVPB 3.375 g (3.375 g Intravenous New Bag/Given 03/06/21 1416)  sodium chloride 0.9 % bolus 1,000 mL (1,000 mLs Intravenous New Bag/Given 03/06/21 1415)  ondansetron (ZOFRAN) injection 4 mg (4 mg Intravenous Given 03/06/21 1416)  morphine 4 MG/ML injection 4 mg (4 mg Intravenous Given 03/06/21 1416)   ED Course  I have reviewed the triage vital signs and the nursing notes.  Pertinent labs & imaging results that were available during my care of the patient were reviewed by me and considered in my medical decision making (see chart for details).  Here for evaluation of abnormal CT.  Was found on imaging earlier today to have liver abscess from likely perforated cholecystitis.  Initial temp 99.9,  repeat 97.7 without any antipyretics.  Heart and lungs clear.  Abdomen diffusely tender, worse to right upper quadrant. Plan on labs, Will need admission for further management.   Labs and imaging personally reviewed and interpreted:  CBC with leukocytosis CMP hypokalemia, hyponatremia, elevated glucose, elevated alk phos Lipase 23 CT AP with RUQ abscess likely from walled off perforated cholecystitis  General surgery was contacted by GI.  PA at bedside on my reevaluation.  Recommending IR consult, medicine admit.   CONSULT with IR  who has reviewed imaging. Patient will need drain placement  CONSULT with Dr. Olevia Bowens with [facility] who will evaluate patient for admission  Patient will be admitted for further work-up and management of her intra-abdominal abscess.   The patient appears reasonably stabilized for admission considering the current resources, flow, and capabilities available in the ED at this time, and I doubt any other [facility] requiring further screening and/or treatment in the ED prior to admission.   Patient discussed with attending who agrees with above treatment, plan and disposition     MDM Rules/Calculators/A&P                            Final Clinical Impression(s) / ED Diagnoses Final diagnoses:  Intra-abdominal abscess ([place])  Perforation of gallbladder in cholecystitis    Rx / DC Orders ED Discharge Orders     None        Xitlali Kastens A, PA-C 03/06/21 1443    Kommor, Debe Coder, MD 03/06/21 1447

## 2021-03-06 NOTE — ED Triage Notes (Signed)
Pt states she was at outpatient CT, states she has been having ongoing problems with abd pain for awhile. Had abd result on CT

## 2021-03-06 NOTE — Telephone Encounter (Signed)
Called by radiology about abnormal CT scan.  There is what appears to be a large subhepatic abscess.  Patient is in IR holding and I saw her there.  We are working on getting her admitted to the hospital I have paged the hospitalist.

## 2021-03-07 ENCOUNTER — Telehealth: Payer: Self-pay

## 2021-03-07 ENCOUNTER — Ambulatory Visit: Payer: 59 | Admitting: Internal Medicine

## 2021-03-07 DIAGNOSIS — K82A2 Perforation of gallbladder in cholecystitis: Secondary | ICD-10-CM | POA: Diagnosis present

## 2021-03-07 DIAGNOSIS — F32A Depression, unspecified: Secondary | ICD-10-CM | POA: Diagnosis present

## 2021-03-07 DIAGNOSIS — D649 Anemia, unspecified: Secondary | ICD-10-CM | POA: Diagnosis not present

## 2021-03-07 DIAGNOSIS — K219 Gastro-esophageal reflux disease without esophagitis: Secondary | ICD-10-CM | POA: Diagnosis present

## 2021-03-07 DIAGNOSIS — I10 Essential (primary) hypertension: Secondary | ICD-10-CM | POA: Diagnosis present

## 2021-03-07 DIAGNOSIS — N3000 Acute cystitis without hematuria: Secondary | ICD-10-CM | POA: Diagnosis present

## 2021-03-07 DIAGNOSIS — Z8542 Personal history of malignant neoplasm of other parts of uterus: Secondary | ICD-10-CM | POA: Diagnosis not present

## 2021-03-07 DIAGNOSIS — K651 Peritoneal abscess: Secondary | ICD-10-CM | POA: Diagnosis not present

## 2021-03-07 DIAGNOSIS — D75839 Thrombocytosis, unspecified: Secondary | ICD-10-CM

## 2021-03-07 DIAGNOSIS — Z6826 Body mass index (BMI) 26.0-26.9, adult: Secondary | ICD-10-CM | POA: Diagnosis not present

## 2021-03-07 DIAGNOSIS — E7800 Pure hypercholesterolemia, unspecified: Secondary | ICD-10-CM | POA: Diagnosis present

## 2021-03-07 DIAGNOSIS — F419 Anxiety disorder, unspecified: Secondary | ICD-10-CM | POA: Diagnosis present

## 2021-03-07 DIAGNOSIS — I2510 Atherosclerotic heart disease of native coronary artery without angina pectoris: Secondary | ICD-10-CM

## 2021-03-07 DIAGNOSIS — Z20822 Contact with and (suspected) exposure to covid-19: Secondary | ICD-10-CM | POA: Diagnosis present

## 2021-03-07 DIAGNOSIS — D75838 Other thrombocytosis: Secondary | ICD-10-CM | POA: Diagnosis present

## 2021-03-07 DIAGNOSIS — D6489 Other specified anemias: Secondary | ICD-10-CM | POA: Diagnosis present

## 2021-03-07 DIAGNOSIS — Z90710 Acquired absence of both cervix and uterus: Secondary | ICD-10-CM | POA: Diagnosis not present

## 2021-03-07 DIAGNOSIS — Z9884 Bariatric surgery status: Secondary | ICD-10-CM | POA: Diagnosis not present

## 2021-03-07 DIAGNOSIS — E871 Hypo-osmolality and hyponatremia: Secondary | ICD-10-CM | POA: Diagnosis present

## 2021-03-07 DIAGNOSIS — E876 Hypokalemia: Secondary | ICD-10-CM | POA: Diagnosis not present

## 2021-03-07 DIAGNOSIS — E119 Type 2 diabetes mellitus without complications: Secondary | ICD-10-CM

## 2021-03-07 DIAGNOSIS — K8000 Calculus of gallbladder with acute cholecystitis without obstruction: Secondary | ICD-10-CM | POA: Diagnosis present

## 2021-03-07 DIAGNOSIS — D6959 Other secondary thrombocytopenia: Secondary | ICD-10-CM | POA: Diagnosis present

## 2021-03-07 DIAGNOSIS — Z79899 Other long term (current) drug therapy: Secondary | ICD-10-CM | POA: Diagnosis not present

## 2021-03-07 DIAGNOSIS — E440 Moderate protein-calorie malnutrition: Secondary | ICD-10-CM | POA: Diagnosis present

## 2021-03-07 DIAGNOSIS — K5909 Other constipation: Secondary | ICD-10-CM | POA: Diagnosis present

## 2021-03-07 DIAGNOSIS — E1165 Type 2 diabetes mellitus with hyperglycemia: Secondary | ICD-10-CM | POA: Diagnosis present

## 2021-03-07 LAB — CBC
HCT: 32.2 % — ABNORMAL LOW (ref 36.0–46.0)
Hemoglobin: 10.8 g/dL — ABNORMAL LOW (ref 12.0–15.0)
MCH: 31.5 pg (ref 26.0–34.0)
MCHC: 33.5 g/dL (ref 30.0–36.0)
MCV: 93.9 fL (ref 80.0–100.0)
Platelets: 532 10*3/uL — ABNORMAL HIGH (ref 150–400)
RBC: 3.43 MIL/uL — ABNORMAL LOW (ref 3.87–5.11)
RDW: 13.8 % (ref 11.5–15.5)
WBC: 26 10*3/uL — ABNORMAL HIGH (ref 4.0–10.5)
nRBC: 0 % (ref 0.0–0.2)

## 2021-03-07 LAB — GLUCOSE, CAPILLARY
Glucose-Capillary: 117 mg/dL — ABNORMAL HIGH (ref 70–99)
Glucose-Capillary: 131 mg/dL — ABNORMAL HIGH (ref 70–99)
Glucose-Capillary: 158 mg/dL — ABNORMAL HIGH (ref 70–99)
Glucose-Capillary: 159 mg/dL — ABNORMAL HIGH (ref 70–99)

## 2021-03-07 LAB — COMPREHENSIVE METABOLIC PANEL
ALT: 25 U/L (ref 0–44)
AST: 38 U/L (ref 15–41)
Albumin: 2 g/dL — ABNORMAL LOW (ref 3.5–5.0)
Alkaline Phosphatase: 145 U/L — ABNORMAL HIGH (ref 38–126)
Anion gap: 12 (ref 5–15)
BUN: 12 mg/dL (ref 8–23)
CO2: 30 mmol/L (ref 22–32)
Calcium: 8.4 mg/dL — ABNORMAL LOW (ref 8.9–10.3)
Chloride: 94 mmol/L — ABNORMAL LOW (ref 98–111)
Creatinine, Ser: 0.58 mg/dL (ref 0.44–1.00)
GFR, Estimated: >60 mL/min (ref >60)
Glucose, Bld: 172 mg/dL — ABNORMAL HIGH (ref 70–99)
Potassium: 3.9 mmol/L (ref 3.5–5.1)
Sodium: 136 mmol/L (ref 135–145)
Total Bilirubin: 0.7 mg/dL (ref 0.3–1.2)
Total Protein: 5.8 g/dL — ABNORMAL LOW (ref 6.5–8.1)

## 2021-03-07 LAB — PREALBUMIN: Prealbumin: <5 mg/dL — ABNORMAL LOW (ref 18–38)

## 2021-03-07 MED ORDER — INSULIN ASPART 100 UNIT/ML IJ SOLN: 0.0000 [IU] | Freq: Three times a day (TID) | INTRAMUSCULAR | Status: DC

## 2021-03-07 MED ORDER — SODIUM CHLORIDE 0.9 % IV SOLN: INTRAVENOUS | Status: DC

## 2021-03-07 NOTE — Progress Notes (Signed)
Patient ID: Sandra [name], female   DOB: 10/30/1960, 60 y.o.   MRN: [id number] [facility] Progress Note     Subjective: CC-  Abdomen sore around drain. Denies n/v. Passing some flatus, no BM. Feels hungry. WBC up 26, TMAX 102.9 BP soft over night. Denies lightheaded or dizziness.  Objective: Vital signs in last 24 hours: Temp:  [97.4 F (36.3 C)-102.9 F (39.4 C)] 98.1 F (36.7 C) (11/09 0756) Pulse Rate:  [72-130] 72 (11/09 0756) Resp:  [12-23] 18 (11/09 0756) BP: (84-165)/(48-106) 85/56 (11/09 0756) SpO2:  [92 %-100 %] 96 % (11/09 0756) Last BM Date: 03/06/21  Intake/Output from previous day: 11/08 0701 - 11/09 0700 In: 157.5 [I.V.:7.6; IV Piggyback:149.9] Out: 900 [Urine:600; Drains:300] Intake/Output this shift: No intake/output data recorded.  PE: Gen:  Alert, NAD, pleasant Pulm: rate and effort normal Abd: Soft, minimal distension, mild TTP around drain, no diffuse tenderness, drain with purulent fluid  Lab Results:  Recent Labs    03/06/21 1037 03/07/21 0505  WBC 14.7* 26.0*  HGB 11.0* 10.8*  HCT 33.1* 32.2*  PLT 634* 532*   BMET Recent Labs    03/06/21 1037 03/07/21 0505  NA 131* 136  K 3.0* 3.9  CL 90* 94*  CO2 29 30  GLUCOSE 155* 172*  BUN 8 12  CREATININE 0.35* 0.58  CALCIUM 8.3* 8.4*   PT/INR Recent Labs    03/06/21 1947  LABPROT 15.0  INR 1.2   CMP     Component Value Date/Time   NA 136 03/07/2021 0505   K 3.9 03/07/2021 0505   CL 94 (L) 03/07/2021 0505   CO2 30 03/07/2021 0505   GLUCOSE 172 (H) 03/07/2021 0505   BUN 12 03/07/2021 0505   CREATININE 0.58 03/07/2021 0505   CREATININE 0.58 10/22/2016 1158   CALCIUM 8.4 (L) 03/07/2021 0505   PROT 5.8 (L) 03/07/2021 0505   ALBUMIN 2.0 (L) 03/07/2021 0505   AST 38 03/07/2021 0505   ALT 25 03/07/2021 0505   ALKPHOS 145 (H) 03/07/2021 0505   BILITOT 0.7 03/07/2021 0505   GFRNONAA >60 03/07/2021 0505   GFRAA >60 10/16/2017 0842   Lipase     Component Value  Date/Time   LIPASE 23 03/06/2021 1037       Studies/Results: CT Abdomen Pelvis W Contrast  Result Date: 03/06/2021 CLINICAL DATA:  Abdominal pain. Remote history of gastric bypass surgery. Recent endoscopy and biopsy. EXAM: CT ABDOMEN AND PELVIS WITH CONTRAST TECHNIQUE: Multidetector CT imaging of the abdomen and pelvis was performed using the standard protocol following bolus administration of intravenous contrast. CONTRAST:  89mL OMNIPAQUE IOHEXOL 350 MG/ML SOLN COMPARISON:  Recent CT scan 02/17/2021 FINDINGS: Lower chest: There are very small pleural effusions and bibasilar atelectasis. The heart is normal in size. Age advanced LAD calcifications are noted. Hepatobiliary: No hepatic lesions or intrahepatic biliary dilatation. I am unable to identify the gallbladder. It was present on the prior CT scan and there were several small layering gallstones noted. There is a large complex right upper quadrant abscess. Perforated cholecystitis with abscess is certainly a consideration. Normal caliber and course of the common bile duct. Pancreas: Mild mass effect on the pancreatic head by the large right upper quadrant abscess no mass, inflammation or ductal dilatation. Spleen: Normal size. Stable splenic cysts. Adrenals/Urinary Tract: The adrenal glands and kidneys are unremarkable and stable. Small renal calculi again noted. The bladder is unremarkable. Stomach/Bowel: Surgical changes from gastric bypass surgery. I do not see any obvious complicating  features associated with the bypass. Contrast gets all the way to the colon without evidence of obstruction. There is moderate inflammation involving the hepatic flexure region of the colon but this is due to the surrounding abscess. Vascular/Lymphatic: The aorta and branch vessels are patent. Stable atherosclerotic calcifications. Borderline enlarged mesenteric and retroperitoneal lymph nodes are likely reactive. Reproductive: Surgically absent. Other: There is a  large complex multifaceted right upper quadrant abscess measuring a maximum of 20 cm. Given its location and appearance I think it is most likely walled-off perforated cholecystitis. Musculoskeletal: No significant bony findings. Moderate degenerative changes involving the spine. IMPRESSION: 1. 20 cm complex right upper quadrant abscess. I think it is most likely walled-off perforated cholecystitis. 2. Surgical changes from gastric bypass surgery. No obvious complicating features associated with the bypass surgery. 3. Small pleural effusions and bibasilar atelectasis. 4. Age advanced LAD calcifications. These results were called by telephone at the time of interpretation on 03/06/2021 at 9:09 am to provider Silvano Rusk , who verbally acknowledged these results. Electronically Signed   By: Marijo Sanes M.D.   On: 03/06/2021 09:12   IR Perc Cholecystostomy  Result Date: 03/06/2021 INDICATION: 60 year old female presenting with acute cholecystitis. EXAM: Ultrasound and fluoroscopic guided percutaneous cholecystostomy tube placement. MEDICATIONS: The patient is receiving intravenous Zosyn as an inpatient with the last dose finishing approximately 1 hour prior to this procedure. ANESTHESIA/SEDATION: Moderate (conscious) sedation was employed during this procedure. A total of Versed 2 mg and Fentanyl 100 mcg was administered intravenously. Moderate Sedation Time: 19 minutes. The patient's level of consciousness and vital signs were monitored continuously by radiology nursing throughout the procedure under my direct supervision. FLUOROSCOPY TIME:  Fluoroscopy Time: 1 minutes 0 seconds (6 mGy). COMPLICATIONS: None immediate. PROCEDURE: Informed written consent was obtained from the patient after a thorough discussion of the procedural risks, benefits and alternatives. All questions were addressed. Maximal Sterile Barrier Technique was utilized including caps, mask, sterile gowns, sterile gloves, sterile drape, hand  hygiene and skin antiseptic. A timeout was performed prior to the initiation of the procedure. The right upper quadrant was prepped and draped in standard fashion. Preprocedure ultrasound evaluation demonstrated gas and fluid containing irregularly-shaped collection in the gallbladder fossa. The procedure was planned. Subdermal Local anesthesia was provided with 1% lidocaine. Deeper local anesthetic was administered under ultrasound guidance to the level of the liver capsule. Small skin nick was made. Under direct ultrasound visualization, an 18 gauge trocar needle was directed into the fluid collection/gallbladder. The inner stylet was removed and purulent aspirate was visualized. Small volume of contrast was injected demonstrating likely intraluminal position within the gallbladder, difficult to discern given severe distension irregularity. An Amplatz wire was inserted under fluoroscopic guidance which coiled in the gallbladder fossa. Serial dilation was performed followed by placement of a 10.2 French pigtail cholecystostomy tube. There was immediate release of gas followed by aspiration of 1 L of purulent material from the gallbladder. The catheter was coiled and locked then placed to bulb suction. A single, 0 silk retention suture was placed at the skin entry site. A sterile bandage was applied. The patient tolerated procedure well was transferred back to the floor in stable condition. IMPRESSION: Technically successful percutaneous, transhepatic, 10.2 French cholecystostomy tube placement with copious gas and purulent fluid aspirate. PLAN: IR will arrange for cholecystostomy tube check and change in 8 weeks. Ruthann Cancer, MD [facility] Electronically Signed   By: Ruthann Cancer M.D.   On: 03/06/2021 17:01  Anti-infectives: Anti-infectives (From admission, onward)    Start     Dose/Rate Route Frequency Ordered Stop   03/06/21 2200   piperacillin-tazobactam (ZOSYN) IVPB 3.375 g        3.375 g 12.5 mL/hr over 240 Minutes Intravenous Every 8 hours 03/06/21 1909     03/06/21 1400  piperacillin-tazobactam (ZOSYN) IVPB 3.375 g        3.375 g 12.5 mL/hr over 240 Minutes Intravenous  Once 03/06/21 1349 03/06/21 1514        Assessment/Plan ?Perforated cholecystitis with RUQ abscess - s/p IR Cholecystostomy tube placement 11/8 - continue drain and broad spectrum abx, follow culture - ok for bariatric clear liquids - mobilize   ID - zosyn 11/8>> FEN - IVF, Bariatric clear liquids VTE - ok for chemical dvt prophylaxis from our standpoint Foley - none     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 Lucia Gaskins, known non-bleeding marginal ulcer  CAD PMH endometrial cancer s/p TAH 2006   LOS: 0 days    Wellington Hampshire, [facility] 03/07/2021, 8:48 AM Please see Amion for pager number during day hours 7:00am-4:30pm

## 2021-03-07 NOTE — Progress Notes (Signed)
PROGRESS NOTE  Sandra [name] LPF:[id number] DOB: [birth year] DOA: 03/06/2021 PCP: [name], MD  HPI/Recap of past 24 hours: Sandra [name] is a 60 y.o. female with medical history significant of anxiety, depression, osteoarthritis, CAD, type II DM diet-controlled, gastrojejunal ulcer, GERD, history of gastric bypass, hyperlipidemia, hypertension, sleep apnea no longer on CPAP after gastric bypass surgery was admitted and discharged from 02/17/2021 until 02/20/2021 due to right upper quadrant pain associated with nausea with negative work-up.  She continued to have pain after being discharged and she called Dr. Carlean Purl who ordered a CT abdomen/pelvis which showed a RUQ abscess. In the ED, VSS, labs notable for leukocytosis, hypokalemia, otherwise stable.  General surgery and IR was consulted.  IR placed drain on 11/8. Patient admitted for further management     Today, patient still reporting some abdominal pain around drain sites, denies any nausea/vomiting, chest pain, shortness of breath.  No further fever spike noted.  Was able to tolerate her clear liquid diet this morning.  Assessment/Plan: Principal Problem:   Intra-abdominal abscess (HCC) Active Problems:   Type 2 diabetes mellitus ([place])   Hypercholesterolemia   Coronary atherosclerosis of native coronary artery   Normocytic anemia   Hypokalemia   Hyponatremia   Thrombocytosis   Moderate protein-calorie malnutrition (HCC)   RUQ Intra-abdominal abscess, likely from gallbladder S/p IR cholecystostomy tube placement on 11/8 Currently afebrile, last fever spike on 03/06/2021, with marked leukocytosis No blood culture drawn, will order Drain abscess culture showing abundant gram-negative rods General surgery and IR on board Continue IV fluids Continue IV Zosyn Continue bariatric clear liquids Monitor closely  Type 2 diabetes mellitus Last A1c on 01/2021 showed 6.4 Not on any meds SSI, Accu-Cheks, hypoglycemic  protocol  CAD Currently chest pain free Follow-up with PCP    Normocytic anemia Daily CBC   Thrombocytosis Likely reactive secondary to abscess Daily cbc  Hx of laparoscopic Roux-en-Y gastric bypass  Performed on 09/2013 Dr. Lucia Gaskins, with known non-bleeding marginal ulcer   Hx of endometrial cancer s/p TAH 2006    Estimated body mass index is 26.88 kg/m as calculated from the following:   Height as of this encounter: 5' 2.01" (1.575 m).   Weight as of 02/17/21: 66.7 kg.     Code Status: Full  Family Communication:   Disposition Plan: Status is: Inpatient  Remains inpatient appropriate because: Level of care    Consultants: General surgery IR  Procedures: Drain placement on 03/06/2021 by IR  Antimicrobials: IV Zosyn  DVT prophylaxis: Lovenox   Objective: Vitals:   03/07/21 0500 03/07/21 0756 03/07/21 1320 03/07/21 1400  BP:  (!) 85/56 (!) 82/66 93/70  Pulse:  72 79 79  Resp:  18 18   Temp:  98.1 F (36.7 C) 97.6 F (36.4 C)   TempSrc:      SpO2:  96% 96%   Height: 5' 2.01" (1.575 m)       Intake/Output Summary (Last 24 hours) at 03/07/2021 1647 Last data filed at 03/07/2021 1500 Gross per 24 hour  Intake 1017.5 ml  Output 1390 ml  Net -372.5 ml   There were no vitals filed for this visit.  Exam: General: NAD  Cardiovascular: S1, S2 present Respiratory: CTAB Abdomen: Soft, tender around drain site, nondistended, bowel sounds present, drain noted Musculoskeletal: No bilateral pedal edema noted Skin: Normal Psychiatry: Normal mood     Data Reviewed: CBC: Recent Labs  Lab 03/05/21 1426 03/06/21 1037 03/07/21 0505  WBC 14.4* 14.7* 26.0*  NEUTROABS  --  11.3*  --   HGB 12.1 11.0* 10.8*  HCT 36.5 33.1* 32.2*  MCV 91.6 91.7 93.9  PLT 701.0* 634* 638*   Basic Metabolic Panel: Recent Labs  Lab 03/05/21 1426 03/06/21 1037 03/07/21 0505  NA 136 131* 136  K 3.2* 3.0* 3.9  CL 92* 90* 94*  CO2 34* 29 30  GLUCOSE 157* 155* 172*   BUN 13 8 12   CREATININE 0.40 0.35* 0.58  CALCIUM 8.9 8.3* 8.4*  MG  --  1.8  --   PHOS  --  3.6  --    GFR: CrCl cannot be calculated (Unknown ideal weight.). Liver Function Tests: Recent Labs  Lab 03/05/21 1426 03/06/21 1037 03/07/21 0505  AST 31 27 38  ALT 27 27 25   ALKPHOS 150* 143* 145*  BILITOT 0.7 0.9 0.7  PROT 6.8 6.4* 5.8*  ALBUMIN 3.1* 2.3* 2.0*   Recent Labs  Lab 03/05/21 1426 03/06/21 1037  LIPASE 69.0* 23   No results for input(s): AMMONIA in the last 168 hours. Coagulation Profile: Recent Labs  Lab 03/06/21 1947  INR 1.2   Cardiac Enzymes: No results for input(s): CKTOTAL, CKMB, CKMBINDEX, TROPONINI in the last 168 hours. BNP (last 3 results) No results for input(s): PROBNP in the last 8760 hours. HbA1C: No results for input(s): HGBA1C in the last 72 hours. CBG: Recent Labs  Lab 03/07/21 0010 03/07/21 0525  GLUCAP 158* 159*   Lipid Profile: No results for input(s): CHOL, HDL, LDLCALC, TRIG, CHOLHDL, LDLDIRECT in the last 72 hours. Thyroid Function Tests: No results for input(s): TSH, T4TOTAL, FREET4, T3FREE, THYROIDAB in the last 72 hours. Anemia Panel: No results for input(s): VITAMINB12, FOLATE, FERRITIN, TIBC, IRON, RETICCTPCT in the last 72 hours. Urine analysis:    Component Value Date/Time   COLORURINE AMBER (A) 03/06/2021 1858   APPEARANCEUR HAZY (A) 03/06/2021 1858   LABSPEC >1.046 (H) 03/06/2021 1858   PHURINE 5.0 03/06/2021 1858   GLUCOSEU NEGATIVE 03/06/2021 1858   GLUCOSEU NEGATIVE 11/13/2018 1452   HGBUR SMALL (A) 03/06/2021 1858   BILIRUBINUR NEGATIVE 03/06/2021 1858   KETONESUR 5 (A) 03/06/2021 1858   PROTEINUR NEGATIVE 03/06/2021 1858   UROBILINOGEN 0.2 11/13/2018 1452   NITRITE NEGATIVE 03/06/2021 1858   LEUKOCYTESUR TRACE (A) 03/06/2021 1858   Sepsis Labs: @LABRCNTIP (procalcitonin:4,lacticidven:4)  ) Recent Results (from the past 240 hour(s))  Resp Panel by RT-PCR (Flu A&B, Covid) Nasopharyngeal Swab      Status: None   Collection Time: 03/06/21 10:37 AM   Specimen: Nasopharyngeal Swab; Nasopharyngeal(NP) swabs in vial transport medium  Result Value Ref Range Status   SARS Coronavirus 2 by RT PCR NEGATIVE NEGATIVE Final    Comment: (NOTE) SARS-CoV-2 target nucleic acids are NOT DETECTED.  The SARS-CoV-2 RNA is generally detectable in upper respiratory specimens during the acute phase of infection. The lowest concentration of SARS-CoV-2 viral copies this assay can detect is 138 copies/mL. A negative result does not preclude SARS-Cov-2 infection and should not be used as the sole basis for treatment or other patient management decisions. A negative result may occur with  improper specimen collection/handling, submission of specimen other than nasopharyngeal swab, presence of viral mutation(s) within the areas targeted by this assay, and inadequate number of viral copies(<138 copies/mL). A negative result must be combined with clinical observations, patient history, and epidemiological information. The expected result is Negative.  Fact Sheet for Patients:  EntrepreneurPulse.com.au  Fact Sheet for Healthcare Providers:  IncredibleEmployment.be  This test is no t  yet approved or cleared by the [place] and  has been authorized for detection and/or diagnosis of SARS-CoV-2 by FDA under an Emergency Use Authorization (EUA). This EUA will remain  in effect (meaning this test can be used) for the duration of the COVID-19 declaration under Section 564(b)(1) of the Act, 21 U.S.C.section 360bbb-3(b)(1), unless the authorization is terminated  or revoked sooner.       Influenza A by PCR NEGATIVE NEGATIVE Final   Influenza B by PCR NEGATIVE NEGATIVE Final    Comment: (NOTE) The Xpert Xpress SARS-CoV-2/FLU/RSV plus assay is intended as an aid in the diagnosis of influenza from Nasopharyngeal swab specimens and should not be used as a sole basis  for treatment. Nasal washings and aspirates are unacceptable for Xpert Xpress SARS-CoV-2/FLU/RSV testing.  Fact Sheet for Patients: EntrepreneurPulse.com.au  Fact Sheet for Healthcare Providers: IncredibleEmployment.be  This test is not yet approved or cleared by the [place] FDA and has been authorized for detection and/or diagnosis of SARS-CoV-2 by FDA under an Emergency Use Authorization (EUA). This EUA will remain in effect (meaning this test can be used) for the duration of the COVID-19 declaration under Section 564(b)(1) of the Act, 21 U.S.C. section 360bbb-3(b)(1), unless the authorization is terminated or revoked.  Performed at [facility], [place] [address], [place], [place] [address]   Aerobic/Anaerobic Culture w Gram Stain (surgical/deep wound)     Status: None (Preliminary result)   Collection Time: 03/06/21  4:45 PM   Specimen: Abscess  Result Value Ref Range Status   Specimen Description   Final    ABSCESS GALL BLADDER Performed at [place] [address] [place], [place] [address]    Special Requests   Final    NONE Performed at [facility], [place] [address] [place], [place] [address]    Gram Stain   Final    FEW WBC PRESENT, PREDOMINANTLY MONONUCLEAR FEW GRAM NEGATIVE RODS    Culture   Final    ABUNDANT GRAM NEGATIVE RODS CULTURE REINCUBATED FOR BETTER GROWTH Performed at [facility], [place] [address], [place],  [address]    Report Status PENDING  Incomplete      Studies: No results found.  Scheduled Meds:  acetaminophen  1,000 mg Oral Q6H   enoxaparin (LOVENOX) injection  40 mg Subcutaneous Q24H   lip balm  1 application Topical BID   sodium chloride flush  5 mL Intracatheter Q8H    Continuous Infusions:  0.9 % NaCl with KCl 20 mEq / L 100 mL/hr at 03/07/21 0144   lactated ringers     methocarbamol (ROBAXIN) IV 1,000 mg (03/07/21  0013)   piperacillin-tazobactam 3.375 g (03/07/21 1528)     LOS: 0 days     Alma Friendly, MD [facility]  If 7PM-7AM, please contact night-coverage [email or web address] 03/07/2021, 4:47 PM

## 2021-03-07 NOTE — Telephone Encounter (Signed)
FMLA paperwork filled out by Dr Carlean Purl and faxed to [phone]. Scanned into her chart. I left her a detailed voicemail message that the paperwork has been faxed and I am mailing her a copy.

## 2021-03-07 NOTE — Progress Notes (Addendum)
   03/06/21 2151  Assess: MEWS Score  Temp 97.9 F (36.6 C)  BP (!) 84/57  Pulse Rate (!) 108  Resp 20  SpO2 94 %  O2 Device Room Air  Assess: MEWS Score  MEWS Temp 0  MEWS Systolic 1  MEWS Pulse 1  MEWS RR 0  MEWS LOC 0  MEWS Score 2  MEWS Score Color Yellow  Assess: if the MEWS score is Yellow or Red  Were vital signs taken at a resting state? Yes  Focused Assessment No change from prior assessment  Does the patient meet 2 or more of the SIRS criteria? Yes  Does the patient have a confirmed or suspected source of infection? Yes  Provider and Rapid Response Notified? Yes  MEWS guidelines implemented *See Row Information* No, previously red, continue vital signs every 4 hours  Treat  MEWS Interventions Escalated (See documentation below)  Pain Scale 0-10  Pain Score 5  Pain Type Acute pain  Pain Location Abdomen  Pain Orientation Right;Upper  Pain Descriptors / Indicators Aching;Discomfort  Pain Frequency Intermittent  Pain Onset On-going  Patients Stated Pain Goal 0  Pain Intervention(s) MD notified (Comment);Distraction  Multiple Pain Sites No  Take Vital Signs  Increase Vital Sign Frequency  Yellow: Q 2hr X 2 then Q 4hr X 2, if remains yellow, continue Q 4hrs  Escalate  MEWS: Escalate Yellow: discuss with charge nurse/RN and consider discussing with provider and RRT  Notify: Charge Nurse/RN  Name of Charge Nurse/RN Notified Tom, RN  Date Charge Nurse/RN Notified 03/06/21  Time Charge Nurse/RN Notified 2151  Notify: Provider  Provider Name/Title J. Olena Heckle, NP  Date Provider Notified 03/06/21  Time Provider Notified 2154  Notification Type  (secure chat)  Notification Reason Other (Comment) (red to yellow mews status change)  Provider response Evaluate remotely;See new orders  Date of Provider Response 03/07/21  Time of Provider Response 2157  Notify: Rapid Response  Name of Rapid Response RN Notified  (RR RN already made aware by house supervisor before  patient was transported to floor.)  Date Rapid Response Notified 03/06/21  Time Rapid Response Notified  (UTA)  Document  Patient Outcome Other (Comment) (Temperature stabilized, but blood pressure is 84/57. Provider Olena Heckle notified and new orders are placed.)  Progress note created (see row info) Yes  Assess: SIRS CRITERIA  SIRS Temperature  0  SIRS Pulse 1  SIRS Respirations  0  SIRS WBC 1  SIRS Score Sum  2

## 2021-03-07 NOTE — Progress Notes (Signed)
Supervising Physician: Corrie Mckusick  Patient Status:  [facility] - In-pt  Chief Complaint:  Acute cystitis  Subjective:  1 day s/p percutaneous cholecystostomy tube placement with 1L drained at time of procedure and another 300cc overnight.    Reports feeling much better, less pressure and pain.  Appetite returning.     Allergies: Nsaids and Statins  Medications: Prior to Admission medications   Medication Sig Start Date End Date Taking? Authorizing Provider  gabapentin (NEURONTIN) 300 MG capsule TAKE 1 CAPSULE BY MOUTH 3 TIMES DAILY. Patient taking differently: Take 300 mg by mouth at bedtime. 06/19/20 06/19/21 Yes Lyndal Pulley, DO  Multiple Vitamins-Minerals (MULTIVITAMIN PO) Take 2 tablets by mouth daily.    Yes [provider]  omeprazole (PRILOSEC) 40 MG capsule Take 1 capsule by mouth 2 times daily before a meal. Open capsule and sprinkle in water, other liquid or applesauce 02/27/21  Yes Gatha Mayer, MD  ondansetron (ZOFRAN-ODT) 4 MG disintegrating tablet DISSOLVE 1 TABLET BY MOUTH EVERY 8 HOURS AS NEEDED FOR NAUSEA OR VOMITING (MAY TAKE 2 TABLETS IF 1 TABLET IS INEFFECTIVE) Patient taking differently: Take 4-8 mg by mouth every 8 (eight) hours as needed for nausea or vomiting. 06/26/20 06/26/21 Yes Gatha Mayer, MD  oxyCODONE (OXY IR/ROXICODONE) 5 MG immediate release tablet Take 1 tablet (5 mg total) by mouth every 4 (four) hours as needed for up to 5 days. Patient taking differently: Take 5 mg by mouth every 4 (four) hours as needed for severe pain. 03/05/21 03/10/21 Yes Gatha Mayer, MD  polyethylene glycol powder (MIRALAX) 17 GM/SCOOP powder Take 17 g by mouth daily. 02/20/21  Yes Hall, Carole N, DO  sucralfate (CARAFATE) 1 g tablet Take 1 tablet (1 g total) by mouth 4 (four) times daily -  with meals and at bedtime for 14 days. 02/20/21 03/06/21 Yes Hall, Lorenda Cahill, DO  albuterol (VENTOLIN HFA) 108 (90 Base) MCG/ACT inhaler Inhale 1-2 puffs into the lungs  every 4 (four) hours as needed for wheezing or shortness of breath. Patient not taking: Reported on 03/06/2021 01/10/20   Margarita Mail, PA-C  glucose blood (FREESTYLE LITE) test strip Use daily as instructed. 01/05/21   [name], MD     Vital Signs: BP (!) 85/56 (BP Location: Left Arm)   Pulse 72   Temp 98.1 F (36.7 C)   Resp 18   Ht 5' 2.01" (1.575 m)   SpO2 96%   BMI 26.88 kg/m   Drain Location: RUQ Size: Fr size: 10 Fr Date of placement: 03/06/21  Currently to: Drain collection device: suction bulb 24 hour output:  Output by Drain (mL) 03/05/21 0701 - 03/05/21 1900 03/05/21 1901 - 03/06/21 0700 03/06/21 0701 - 03/06/21 1900 03/06/21 1901 - 03/07/21 0700 03/07/21 0701 - 03/07/21 1023  Closed System Drain 1 Right RUQ Bulb (JP) 10.2 Fr.    300 40   Current examination: Flushes/aspirates easily.  Insertion site unremarkable. Suture in place.  Stat lock needs replacement Dressed appropriately.   Imaging: CT Abdomen Pelvis W Contrast  Result Date: 03/06/2021 CLINICAL DATA:  Abdominal pain. Remote history of gastric bypass surgery. Recent endoscopy and biopsy. EXAM: CT ABDOMEN AND PELVIS WITH CONTRAST TECHNIQUE: Multidetector CT imaging of the abdomen and pelvis was performed using the standard protocol following bolus administration of intravenous contrast. CONTRAST:  82mL OMNIPAQUE IOHEXOL 350 MG/ML SOLN COMPARISON:  Recent CT scan 02/17/2021 FINDINGS: Lower chest: There are very small pleural effusions and bibasilar atelectasis. The heart  is normal in size. Age advanced LAD calcifications are noted. Hepatobiliary: No hepatic lesions or intrahepatic biliary dilatation. I am unable to identify the gallbladder. It was present on the prior CT scan and there were several small layering gallstones noted. There is a large complex right upper quadrant abscess. Perforated cholecystitis with abscess is certainly a consideration. Normal caliber and course of the common bile duct. Pancreas:  Mild mass effect on the pancreatic head by the large right upper quadrant abscess no mass, inflammation or ductal dilatation. Spleen: Normal size. Stable splenic cysts. Adrenals/Urinary Tract: The adrenal glands and kidneys are unremarkable and stable. Small renal calculi again noted. The bladder is unremarkable. Stomach/Bowel: Surgical changes from gastric bypass surgery. I do not see any obvious complicating features associated with the bypass. Contrast gets all the way to the colon without evidence of obstruction. There is moderate inflammation involving the hepatic flexure region of the colon but this is due to the surrounding abscess. Vascular/Lymphatic: The aorta and branch vessels are patent. Stable atherosclerotic calcifications. Borderline enlarged mesenteric and retroperitoneal lymph nodes are likely reactive. Reproductive: Surgically absent. Other: There is a large complex multifaceted right upper quadrant abscess measuring a maximum of 20 cm. Given its location and appearance I think it is most likely walled-off perforated cholecystitis. Musculoskeletal: No significant bony findings. Moderate degenerative changes involving the spine. IMPRESSION: 1. 20 cm complex right upper quadrant abscess. I think it is most likely walled-off perforated cholecystitis. 2. Surgical changes from gastric bypass surgery. No obvious complicating features associated with the bypass surgery. 3. Small pleural effusions and bibasilar atelectasis. 4. Age advanced LAD calcifications. These results were called by telephone at the time of interpretation on 03/06/2021 at 9:09 am to provider Silvano Rusk , who verbally acknowledged these results. Electronically Signed   By: Marijo Sanes M.D.   On: 03/06/2021 09:12   IR Perc Cholecystostomy  Result Date: 03/06/2021 INDICATION: 60 year old female presenting with acute cholecystitis. EXAM: Ultrasound and fluoroscopic guided percutaneous cholecystostomy tube placement. MEDICATIONS:  The patient is receiving intravenous Zosyn as an inpatient with the last dose finishing approximately 1 hour prior to this procedure. ANESTHESIA/SEDATION: Moderate (conscious) sedation was employed during this procedure. A total of Versed 2 mg and Fentanyl 100 mcg was administered intravenously. Moderate Sedation Time: 19 minutes. The patient's level of consciousness and vital signs were monitored continuously by radiology nursing throughout the procedure under my direct supervision. FLUOROSCOPY TIME:  Fluoroscopy Time: 1 minutes 0 seconds (6 mGy). COMPLICATIONS: None immediate. PROCEDURE: Informed written consent was obtained from the patient after a thorough discussion of the procedural risks, benefits and alternatives. All questions were addressed. Maximal Sterile Barrier Technique was utilized including caps, mask, sterile gowns, sterile gloves, sterile drape, hand hygiene and skin antiseptic. A timeout was performed prior to the initiation of the procedure. The right upper quadrant was prepped and draped in standard fashion. Preprocedure ultrasound evaluation demonstrated gas and fluid containing irregularly-shaped collection in the gallbladder fossa. The procedure was planned. Subdermal Local anesthesia was provided with 1% lidocaine. Deeper local anesthetic was administered under ultrasound guidance to the level of the liver capsule. Small skin nick was made. Under direct ultrasound visualization, an 18 gauge trocar needle was directed into the fluid collection/gallbladder. The inner stylet was removed and purulent aspirate was visualized. Small volume of contrast was injected demonstrating likely intraluminal position within the gallbladder, difficult to discern given severe distension irregularity. An Amplatz wire was inserted under fluoroscopic guidance which coiled in the gallbladder fossa. Serial  dilation was performed followed by placement of a 10.2 French pigtail cholecystostomy tube. There was  immediate release of gas followed by aspiration of 1 L of purulent material from the gallbladder. The catheter was coiled and locked then placed to bulb suction. A single, 0 silk retention suture was placed at the skin entry site. A sterile bandage was applied. The patient tolerated procedure well was transferred back to the floor in stable condition. IMPRESSION: Technically successful percutaneous, transhepatic, 10.2 French cholecystostomy tube placement with copious gas and purulent fluid aspirate. PLAN: IR will arrange for cholecystostomy tube check and change in 8 weeks. Ruthann Cancer, MD [facility] Radiology Electronically Signed   By: Ruthann Cancer M.D.   On: 03/06/2021 17:01    Labs:  CBC: Recent Labs    02/20/21 0616 03/05/21 1426 03/06/21 1037 03/07/21 0505  WBC 11.1* 14.4* 14.7* 26.0*  HGB 13.0 12.1 11.0* 10.8*  HCT 40.5 36.5 33.1* 32.2*  PLT 192 701.0* 634* 532*    COAGS: Recent Labs    03/06/21 1947  INR 1.2    BMP: Recent Labs    02/19/21 0219 02/20/21 0616 03/05/21 1426 03/06/21 1037 03/07/21 0505  NA 135 134* 136 131* 136  K 4.0 3.7 3.2* 3.0* 3.9  CL 100 97* 92* 90* 94*  CO2 26 30 34* 29 30  GLUCOSE 163* 144* 157* 155* 172*  BUN 11 15 13 8 12   CALCIUM 9.2 8.9 8.9 8.3* 8.4*  CREATININE 0.63 0.57 0.40 0.35* 0.58  GFRNONAA >60 >60  --  >60 >60    LIVER FUNCTION TESTS: Recent Labs    02/20/21 0616 03/05/21 1426 03/06/21 1037 03/07/21 0505  BILITOT 0.9 0.7 0.9 0.7  AST 19 31 27  38  ALT 24 27 27 25   ALKPHOS 60 150* 143* 145*  PROT 5.9* 6.8 6.4* 5.8*  ALBUMIN 2.6* 3.1* 2.3* 2.0*    Assessment and Plan:  Continue TID flushes with 5 cc NS. Record output Q shift. Dressing changes QD or PRN if soiled.  Call IR APP or on call IR MD if difficulty flushing or sudden change in drain output.  Repeat imaging/possible drain injection once output < 10 mL/QD (excluding flush material.)  Discharge  planning: Please contact IR APP or on call IR MD prior to patient d/c to ensure appropriate follow up plans are in place. Typically patient will follow up with IR clinic 10-14 days post d/c for repeat imaging/possible drain injection. IR scheduler will contact patient with date/time of appointment. Patient will need to flush drain QD with 5 cc NS, record output QD, dressing changes every 2-3 days or earlier if soiled.   IR will continue to follow - please call with questions or concerns.  Electronically Signed: Pasty Spillers, PA 03/07/2021, 10:20 AM   I spent a total of 25 Minutes at the the patient's bedside AND on the patient's hospital floor or unit, greater than 50% of which was counseling/coordinating care for drain follow up.

## 2021-03-08 LAB — BASIC METABOLIC PANEL
Anion gap: 11 (ref 5–15)
BUN: 16 mg/dL (ref 8–23)
CO2: 25 mmol/L (ref 22–32)
Calcium: 8.2 mg/dL — ABNORMAL LOW (ref 8.9–10.3)
Chloride: 102 mmol/L (ref 98–111)
Creatinine, Ser: 0.55 mg/dL (ref 0.44–1.00)
GFR, Estimated: >60 mL/min (ref >60)
Glucose, Bld: 110 mg/dL — ABNORMAL HIGH (ref 70–99)
Potassium: 2.9 mmol/L — ABNORMAL LOW (ref 3.5–5.1)
Sodium: 138 mmol/L (ref 135–145)

## 2021-03-08 LAB — CBC WITH DIFFERENTIAL/PLATELET
Abs Immature Granulocytes: 0.25 10*3/uL — ABNORMAL HIGH (ref 0.00–0.07)
Basophils Absolute: 0.1 10*3/uL (ref 0.0–0.1)
Basophils Relative: 1 %
Eosinophils Absolute: 0.1 10*3/uL (ref 0.0–0.5)
Eosinophils Relative: 1 %
HCT: 31.8 % — ABNORMAL LOW (ref 36.0–46.0)
Hemoglobin: 10.2 g/dL — ABNORMAL LOW (ref 12.0–15.0)
Immature Granulocytes: 2 %
Lymphocytes Relative: 15 %
Lymphs Abs: 1.9 10*3/uL (ref 0.7–4.0)
MCH: 30.6 pg (ref 26.0–34.0)
MCHC: 32.1 g/dL (ref 30.0–36.0)
MCV: 95.5 fL (ref 80.0–100.0)
Monocytes Absolute: 0.6 10*3/uL (ref 0.1–1.0)
Monocytes Relative: 5 %
Neutro Abs: 9.3 10*3/uL — ABNORMAL HIGH (ref 1.7–7.7)
Neutrophils Relative %: 76 %
Platelets: 509 10*3/uL — ABNORMAL HIGH (ref 150–400)
RBC: 3.33 MIL/uL — ABNORMAL LOW (ref 3.87–5.11)
RDW: 14 % (ref 11.5–15.5)
WBC: 12.2 10*3/uL — ABNORMAL HIGH (ref 4.0–10.5)
nRBC: 0 % (ref 0.0–0.2)

## 2021-03-08 LAB — GLUCOSE, CAPILLARY
Glucose-Capillary: 129 mg/dL — ABNORMAL HIGH (ref 70–99)
Glucose-Capillary: 115 mg/dL — ABNORMAL HIGH (ref 70–99)
Glucose-Capillary: 107 mg/dL — ABNORMAL HIGH (ref 70–99)
Glucose-Capillary: 103 mg/dL — ABNORMAL HIGH (ref 70–99)
Glucose-Capillary: 104 mg/dL — ABNORMAL HIGH (ref 70–99)

## 2021-03-08 MED ORDER — POLYETHYLENE GLYCOL 3350 17 G PO PACK
17.0000 g | PACK | Freq: Every day | ORAL | Status: DC
  Administered 2021-03-08 – 2021-03-09 (×2): 17 g via ORAL
  Filled 2021-03-08 (×2): qty 1

## 2021-03-08 MED ORDER — ADULT MULTIVITAMIN W/MINERALS CH
1.0000 | ORAL_TABLET | Freq: Every day | ORAL | Status: DC
  Administered 2021-03-08 – 2021-03-09 (×2): 1 via ORAL
  Filled 2021-03-08 (×2): qty 1

## 2021-03-08 MED ORDER — GABAPENTIN 300 MG PO CAPS
300.0000 mg | ORAL_CAPSULE | Freq: Every day | ORAL | Status: DC
  Administered 2021-03-08: 300 mg via ORAL
  Filled 2021-03-08: qty 1

## 2021-03-08 MED ORDER — PANTOPRAZOLE SODIUM 40 MG PO TBEC
40.0000 mg | DELAYED_RELEASE_TABLET | Freq: Every day | ORAL | Status: DC
  Filled 2021-03-08: qty 1

## 2021-03-08 MED ORDER — MELATONIN 5 MG PO TABS
5.0000 mg | ORAL_TABLET | Freq: Every day | ORAL | Status: DC
  Administered 2021-03-08: 5 mg via ORAL
  Filled 2021-03-08: qty 1

## 2021-03-08 MED ORDER — POTASSIUM CHLORIDE 10 MEQ/100ML IV SOLN
10.0000 meq | INTRAVENOUS | Status: AC
  Administered 2021-03-08 (×4): 10 meq via INTRAVENOUS
  Filled 2021-03-08 (×4): qty 100

## 2021-03-08 MED ORDER — MELATONIN 3 MG PO TABS
3.0000 mg | ORAL_TABLET | Freq: Every day | ORAL | Status: DC
  Administered 2021-03-08: 3 mg via ORAL
  Filled 2021-03-08: qty 1

## 2021-03-08 MED ORDER — ENSURE MAX PROTEIN PO LIQD
11.0000 [oz_av] | Freq: Two times a day (BID) | ORAL | Status: DC
  Administered 2021-03-08 – 2021-03-09 (×2): 11 [oz_av] via ORAL
  Filled 2021-03-08 (×3): qty 330

## 2021-03-08 MED ORDER — OMEPRAZOLE 20 MG PO CPDR
40.0000 mg | DELAYED_RELEASE_CAPSULE | Freq: Two times a day (BID) | ORAL | Status: DC
  Administered 2021-03-08 – 2021-03-09 (×3): 40 mg via ORAL
  Filled 2021-03-08 (×4): qty 2

## 2021-03-08 NOTE — Progress Notes (Addendum)
Patient ID: Sandra [name], female   DOB: 1960/07/01, 60 y.o.   MRN: [id number] [facility] Surgery Progress Note     Subjective: CC-  Feeling better each day. Tolerating liquids without n/v. Passing flatus, no BM. Feels hungry. Ambulated in the halls yesterday. BP intermittently soft but patient asymptomatic. WBC down 12.2, afebrile Daughter learned how to take care of drain yesterday.  Objective: Vital signs in last 24 hours: Temp:  [97.4 F (36.3 C)-98.3 F (36.8 C)] 97.4 F (36.3 C) (11/10 0424) Pulse Rate:  [74-79] 74 (11/10 0424) Resp:  [18] 18 (11/10 0424) BP: (82-107)/(62-70) 91/63 (11/10 0424) SpO2:  [96 %-97 %] 97 % (11/10 0424) Last BM Date: 03/07/21  Intake/Output from previous day: 11/09 0701 - 11/10 0700 In: 1104.9 [P.O.:120; I.V.:819.9; IV Piggyback:150] Out: 1365 [Urine:1175; Drains:190] Intake/Output this shift: No intake/output data recorded.  PE: Gen:  Alert, NAD, pleasant Pulm: rate and effort normal Abd: Soft, minimal distension, mild TTP around drain, no diffuse tenderness, drain with purulent fluid  Lab Results:  Recent Labs    03/07/21 0505 03/08/21 0503  WBC 26.0* 12.2*  HGB 10.8* 10.2*  HCT 32.2* 31.8*  PLT 532* 509*   BMET Recent Labs    03/07/21 0505 03/08/21 0503  NA 136 138  K 3.9 2.9*  CL 94* 102  CO2 30 25  GLUCOSE 172* 110*  BUN 12 16  CREATININE 0.58 0.55  CALCIUM 8.4* 8.2*   PT/INR Recent Labs    03/06/21 1947  LABPROT 15.0  INR 1.2   CMP     Component Value Date/Time   NA 138 03/08/2021 0503   K 2.9 (L) 03/08/2021 0503   CL 102 03/08/2021 0503   CO2 25 03/08/2021 0503   GLUCOSE 110 (H) 03/08/2021 0503   BUN 16 03/08/2021 0503   CREATININE 0.55 03/08/2021 0503   CREATININE 0.58 10/22/2016 1158   CALCIUM 8.2 (L) 03/08/2021 0503   PROT 5.8 (L) 03/07/2021 0505   ALBUMIN 2.0 (L) 03/07/2021 0505   AST 38 03/07/2021 0505   ALT 25 03/07/2021 0505   ALKPHOS 145 (H) 03/07/2021 0505   BILITOT 0.7  03/07/2021 0505   GFRNONAA >60 03/08/2021 0503   GFRAA >60 10/16/2017 0842   Lipase     Component Value Date/Time   LIPASE 23 03/06/2021 1037       Studies/Results: IR Perc Cholecystostomy  Result Date: 03/06/2021 INDICATION: 60 year old female presenting with acute cholecystitis. EXAM: Ultrasound and fluoroscopic guided percutaneous cholecystostomy tube placement. MEDICATIONS: The patient is receiving intravenous Zosyn as an inpatient with the last dose finishing approximately 1 hour prior to this procedure. ANESTHESIA/SEDATION: Moderate (conscious) sedation was employed during this procedure. A total of Versed 2 mg and Fentanyl 100 mcg was administered intravenously. Moderate Sedation Time: 19 minutes. The patient's level of consciousness and vital signs were monitored continuously by radiology nursing throughout the procedure under my direct supervision. FLUOROSCOPY TIME:  Fluoroscopy Time: 1 minutes 0 seconds (6 mGy). COMPLICATIONS: None immediate. PROCEDURE: Informed written consent was obtained from the patient after a thorough discussion of the procedural risks, benefits and alternatives. All questions were addressed. Maximal Sterile Barrier Technique was utilized including caps, mask, sterile gowns, sterile gloves, sterile drape, hand hygiene and skin antiseptic. A timeout was performed prior to the initiation of the procedure. The right upper quadrant was prepped and draped in standard fashion. Preprocedure ultrasound evaluation demonstrated gas and fluid containing irregularly-shaped collection in the gallbladder fossa. The procedure was planned. Subdermal Local anesthesia was  provided with 1% lidocaine. Deeper local anesthetic was administered under ultrasound guidance to the level of the liver capsule. Small skin nick was made. Under direct ultrasound visualization, an 18 gauge trocar needle was directed into the fluid collection/gallbladder. The inner stylet was removed and purulent  aspirate was visualized. Small volume of contrast was injected demonstrating likely intraluminal position within the gallbladder, difficult to discern given severe distension irregularity. An Amplatz wire was inserted under fluoroscopic guidance which coiled in the gallbladder fossa. Serial dilation was performed followed by placement of a 10.2 French pigtail cholecystostomy tube. There was immediate release of gas followed by aspiration of 1 L of purulent material from the gallbladder. The catheter was coiled and locked then placed to bulb suction. A single, 0 silk retention suture was placed at the skin entry site. A sterile bandage was applied. The patient tolerated procedure well was transferred back to the floor in stable condition. IMPRESSION: Technically successful percutaneous, transhepatic, 10.2 French cholecystostomy tube placement with copious gas and purulent fluid aspirate. PLAN: IR will arrange for cholecystostomy tube check and change in 8 weeks. Ruthann Cancer, MD [facility] Radiology Electronically Signed   By: Ruthann Cancer M.D.   On: 03/06/2021 17:01    Anti-infectives: Anti-infectives (From admission, onward)    Start     Dose/Rate Route Frequency Ordered Stop   03/06/21 2200  piperacillin-tazobactam (ZOSYN) IVPB 3.375 g        3.375 g 12.5 mL/hr over 240 Minutes Intravenous Every 8 hours 03/06/21 1909     03/06/21 1400  piperacillin-tazobactam (ZOSYN) IVPB 3.375 g        3.375 g 12.5 mL/hr over 240 Minutes Intravenous  Once 03/06/21 1349 03/06/21 1514        Assessment/Plan ?Perforated cholecystitis with RUQ abscess - s/p IR Cholecystostomy tube placement 11/8 - WBC trending down 12 from 26, no fevers x24 hours - continue drain and broad spectrum abx, gram stain with GNRs, culture pending. 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 Lucia Gaskins, known non-bleeding marginal ulcer  CAD PMH endometrial cancer s/p TAH 2006 Severe protein calorie malnutrition - prealbumin<5, will ask dietician to consult   LOS: 1 day    Wellington Hampshire, [facility] Surgery 03/08/2021, 9:47 AM Please see Amion for pager number during day hours 7:00am-4:30pm

## 2021-03-08 NOTE — Progress Notes (Signed)
Initial Nutrition Assessment  INTERVENTION:   -Recommend checking B-12 levels d/t h/o gastric bypass  -Recommend speech therapy consult   -Ensure MAX Protein po BID, each supplement provides 150 kcal and 30 grams of protein   -Multivitamin with minerals daily  NUTRITION DIAGNOSIS:   Inadequate oral intake related to acute illness as evidenced by per patient/family report.  GOAL:   Patient will meet greater than or equal to 90% of their needs  MONITOR:   PO intake, Supplement acceptance, Labs, Weight trends, I & O's  REASON FOR ASSESSMENT:   Malnutrition Screening Tool, Consult Assessment of nutrition requirement/status  ASSESSMENT:   60 y.o. female with medical history significant of anxiety, depression, osteoarthritis, CAD, type II DM diet-controlled, gastrojejunal ulcer, GERD, history of gastric bypass, hyperlipidemia, hypertension, sleep apnea no longer on CPAP after gastric bypass surgery was admitted and discharged from 02/17/2021 until 02/20/2021 due to right upper quadrant pain associated with nausea with negative work-up.  She continued to have pain after being discharged and she called Dr. Carlean Purl who ordered a CT abdomen/pelvis which showed a RUQ abscess.  Patient in room with daughter at bedside. Pt was brought some [employer] coffee to drink. States she is starting to feel better and eat a little better too. Ate some japanese hibachi last night of steak and veggies.  Pt having some trouble swallowing which pt's daughter said pt is reluctant to report. Pt was not eating anything for 2 weeks. Daughter states she would eat 1-2 bites of food per day. H/o gastric bypass in 2015. States she was compliant and taking her daily multivitamins and calcium daily. Stopped taking her multivitamin over the past week when she wasn't eating well. May need to have B-12 checked given how far out she is from gastric bypass surgery. Was not taking B-12 at home.  Per weight records, pt has  lost 9 lbs since 7/15 (5% wt loss x 4 months, insignificant for time frame). Pt at risk of malnutrition, currently doesn't meet criteria.   Medications: Miralax, KCl, IV Zofran  Labs reviewed:  CBGs: 107-131 Low K   NUTRITION - FOCUSED PHYSICAL EXAM:  Flowsheet Row Most Recent Value  Orbital Region No depletion  Upper Arm Region Mild depletion  Thoracic and Lumbar Region Unable to assess  Buccal Region No depletion  Temple Region No depletion  Clavicle Bone Region No depletion  Clavicle and Acromion Bone Region No depletion  Scapular Bone Region No depletion  Dorsal Hand No depletion  Patellar Region Unable to assess  Anterior Thigh Region Unable to assess  Posterior Calf Region Unable to assess  Edema (RD Assessment) None  Hair Reviewed  [started noticing some hair loss]  Eyes Reviewed  Mouth Reviewed  Skin Reviewed  Nails Reviewed       Diet Order:   Diet Order             Diet bariatric advanced Room service appropriate? Yes; Fluid consistency: Thin  Diet effective now                   EDUCATION NEEDS:   Education needs have been addressed  Skin:  Skin Assessment: Reviewed RN Assessment  Last BM:  11/9  Height:   Ht Readings from Last 1 Encounters:  03/07/21 5' 2.01" (1.575 m)    Weight:   Wt Readings from Last 1 Encounters:  02/17/21 66.7 kg    BMI:  Body mass index is 26.88 kg/m.  Estimated Nutritional Needs:   Kcal:  1400-1600  Protein:  65-75g  Fluid:  1.6L/day  Clayton Bibles, MS, RD, LDN Inpatient Clinical Dietitian Contact information available via Amion

## 2021-03-08 NOTE — Progress Notes (Signed)
PROGRESS NOTE  Sandra [name] CBJ:[id number] DOB: 31-Oct-1960 DOA: 03/06/2021 PCP: [name], MD  HPI/Recap of past 24 hours: Sandra [name] is a 60 y.o. female with medical history significant of anxiety, depression, osteoarthritis, CAD, type II DM diet-controlled, gastrojejunal ulcer, GERD, history of gastric bypass, hyperlipidemia, hypertension, sleep apnea no longer on CPAP after gastric bypass surgery was admitted and discharged from 02/17/2021 until 02/20/2021 due to right upper quadrant pain associated with nausea with negative work-up.  She continued to have pain after being discharged and she called Dr. Carlean Purl who ordered a CT abdomen/pelvis which showed a RUQ abscess. In the ED, VSS, labs notable for leukocytosis, hypokalemia, otherwise stable.  General surgery and IR was consulted.  IR placed drain on 11/8. Patient admitted for further management    Today, pt still with pain around drain site, denies its worsening. Tolerating diet. Denies any chest pain, SOB, fever/chills.    Assessment/Plan: Principal Problem:   Intra-abdominal abscess (HCC) Active Problems:   Type 2 diabetes mellitus ([place])   Hypercholesterolemia   Coronary atherosclerosis of native coronary artery   Normocytic anemia   Hypokalemia   Hyponatremia   Thrombocytosis   Moderate protein-calorie malnutrition (HCC)   RUQ Intra-abdominal abscess, likely from gallbladder S/p IR cholecystostomy tube placement on 11/8 Currently afebrile, last fever spike on 03/06/2021, with resolving leukocytosis BC X 2 pending  Drain abscess culture showing abundant gram-negative rods General surgery and IR on board Continue IV fluids, due to soft BP Continue IV Zosyn Continue bariatric diet Monitor closely  Hypokalemia Replace prn  Type 2 diabetes mellitus Last A1c on 01/2021 showed 6.4 Not on any meds SSI, Accu-Cheks, hypoglycemic protocol  CAD Currently chest pain free Follow-up with PCP     Normocytic anemia Daily CBC   Thrombocytosis Likely reactive secondary to abscess Daily cbc  Hx of laparoscopic Roux-en-Y gastric bypass  Performed on 09/2013 Dr. Lucia Gaskins, with known non-bleeding marginal ulcer   Hx of endometrial cancer s/p TAH 2006    Estimated body mass index is 26.88 kg/m as calculated from the following:   Height as of this encounter: 5' 2.01" (1.575 m).   Weight as of 02/17/21: 66.7 kg.     Code Status: Full  Family Communication: None at bedside  Disposition Plan: Status is: Inpatient  Remains inpatient appropriate because: Level of care    Consultants: General surgery IR  Procedures: Drain placement on 03/06/2021 by IR  Antimicrobials: IV Zosyn  DVT prophylaxis: Lovenox   Objective: Vitals:   03/07/21 1400 03/07/21 2129 03/08/21 0424 03/08/21 1349  BP: 93/70 107/62 91/63 93/69   Pulse: 79 74 74 70  Resp:  18 18 18   Temp:  98.3 F (36.8 C) (!) 97.4 F (36.3 C) 97.7 F (36.5 C)  TempSrc:  Oral Oral Oral  SpO2:  96% 97% 99%  Height:        Intake/Output Summary (Last 24 hours) at 03/08/2021 1649 Last data filed at 03/08/2021 1350 Gross per 24 hour  Intake 721.88 ml  Output 1185 ml  Net -463.12 ml   There were no vitals filed for this visit.  Exam: General: NAD  Cardiovascular: S1, S2 present Respiratory: CTAB Abdomen: Soft, tender around drain site, nondistended, bowel sounds present, drain noted Musculoskeletal: No bilateral pedal edema noted Skin: Normal Psychiatry: Normal mood     Data Reviewed: CBC: Recent Labs  Lab 03/05/21 1426 03/06/21 1037 03/07/21 0505 03/08/21 0503  WBC 14.4* 14.7* 26.0* 12.2*  NEUTROABS  --  11.3*  --  9.3*  HGB 12.1 11.0* 10.8* 10.2*  HCT 36.5 33.1* 32.2* 31.8*  MCV 91.6 91.7 93.9 95.5  PLT 701.0* 634* 532* 932*   Basic Metabolic Panel: Recent Labs  Lab 03/05/21 1426 03/06/21 1037 03/07/21 0505 03/08/21 0503  NA 136 131* 136 138  K 3.2* 3.0* 3.9 2.9*  CL 92* 90* 94*  102  CO2 34* 29 30 25   GLUCOSE 157* 155* 172* 110*  BUN 13 8 12 16   CREATININE 0.40 0.35* 0.58 0.55  CALCIUM 8.9 8.3* 8.4* 8.2*  MG  --  1.8  --   --   PHOS  --  3.6  --   --    GFR: CrCl cannot be calculated (Unknown ideal weight.). Liver Function Tests: Recent Labs  Lab 03/05/21 1426 03/06/21 1037 03/07/21 0505  AST 31 27 38  ALT 27 27 25   ALKPHOS 150* 143* 145*  BILITOT 0.7 0.9 0.7  PROT 6.8 6.4* 5.8*  ALBUMIN 3.1* 2.3* 2.0*   Recent Labs  Lab 03/05/21 1426 03/06/21 1037  LIPASE 69.0* 23   No results for input(s): AMMONIA in the last 168 hours. Coagulation Profile: Recent Labs  Lab 03/06/21 1947  INR 1.2   Cardiac Enzymes: No results for input(s): CKTOTAL, CKMB, CKMBINDEX, TROPONINI in the last 168 hours. BNP (last 3 results) No results for input(s): PROBNP in the last 8760 hours. HbA1C: No results for input(s): HGBA1C in the last 72 hours. CBG: Recent Labs  Lab 03/07/21 1743 03/07/21 2133 03/08/21 0802 03/08/21 1108 03/08/21 1647  GLUCAP 131* 117* 115* 107* 103*   Lipid Profile: No results for input(s): CHOL, HDL, LDLCALC, TRIG, CHOLHDL, LDLDIRECT in the last 72 hours. Thyroid Function Tests: No results for input(s): TSH, T4TOTAL, FREET4, T3FREE, THYROIDAB in the last 72 hours. Anemia Panel: No results for input(s): VITAMINB12, FOLATE, FERRITIN, TIBC, IRON, RETICCTPCT in the last 72 hours. Urine analysis:    Component Value Date/Time   COLORURINE AMBER (A) 03/06/2021 1858   APPEARANCEUR HAZY (A) 03/06/2021 1858   LABSPEC >1.046 (H) 03/06/2021 1858   PHURINE 5.0 03/06/2021 1858   GLUCOSEU NEGATIVE 03/06/2021 1858   GLUCOSEU NEGATIVE 11/13/2018 1452   HGBUR SMALL (A) 03/06/2021 1858   BILIRUBINUR NEGATIVE 03/06/2021 1858   KETONESUR 5 (A) 03/06/2021 1858   PROTEINUR NEGATIVE 03/06/2021 1858   UROBILINOGEN 0.2 11/13/2018 1452   NITRITE NEGATIVE 03/06/2021 1858   LEUKOCYTESUR TRACE (A) 03/06/2021 1858   Sepsis  Labs: @LABRCNTIP (procalcitonin:4,lacticidven:4)  ) Recent Results (from the past 240 hour(s))  Resp Panel by RT-PCR (Flu A&B, Covid) Nasopharyngeal Swab     Status: None   Collection Time: 03/06/21 10:37 AM   Specimen: Nasopharyngeal Swab; Nasopharyngeal(NP) swabs in vial transport medium  Result Value Ref Range Status   SARS Coronavirus 2 by RT PCR NEGATIVE NEGATIVE Final    Comment: (NOTE) SARS-CoV-2 target nucleic acids are NOT DETECTED.  The SARS-CoV-2 RNA is generally detectable in upper respiratory specimens during the acute phase of infection. The lowest concentration of SARS-CoV-2 viral copies this assay can detect is 138 copies/mL. A negative result does not preclude SARS-Cov-2 infection and should not be used as the sole basis for treatment or other patient management decisions. A negative result may occur with  improper specimen collection/handling, submission of specimen other than nasopharyngeal swab, presence of viral mutation(s) within the areas targeted by this assay, and inadequate number of viral copies(<138 copies/mL). A negative result must be combined with clinical observations, patient history, and epidemiological information.  The expected result is Negative.  Fact Sheet for Patients:  EntrepreneurPulse.com.au  Fact Sheet for Healthcare Providers:  IncredibleEmployment.be  This test is no t yet approved or cleared by the [place] FDA and  has been authorized for detection and/or diagnosis of SARS-CoV-2 by FDA under an Emergency Use Authorization (EUA). This EUA will remain  in effect (meaning this test can be used) for the duration of the COVID-19 declaration under Section 564(b)(1) of the Act, 21 U.S.C.section 360bbb-3(b)(1), unless the authorization is terminated  or revoked sooner.       Influenza A by PCR NEGATIVE NEGATIVE Final   Influenza B by PCR NEGATIVE NEGATIVE Final    Comment: (NOTE) The Xpert  Xpress SARS-CoV-2/FLU/RSV plus assay is intended as an aid in the diagnosis of influenza from Nasopharyngeal swab specimens and should not be used as a sole basis for treatment. Nasal washings and aspirates are unacceptable for Xpert Xpress SARS-CoV-2/FLU/RSV testing.  Fact Sheet for Patients: EntrepreneurPulse.com.au  Fact Sheet for Healthcare Providers: IncredibleEmployment.be  This test is not yet approved or cleared by the [place] FDA and has been authorized for detection and/or diagnosis of SARS-CoV-2 by FDA under an Emergency Use Authorization (EUA). This EUA will remain in effect (meaning this test can be used) for the duration of the COVID-19 declaration under Section 564(b)(1) of the Act, 21 U.S.C. section 360bbb-3(b)(1), unless the authorization is terminated or revoked.  Performed at [facility], [place] [address], [place], [place] [address]   Aerobic/Anaerobic Culture w Gram Stain (surgical/deep wound)     Status: None (Preliminary result)   Collection Time: 03/06/21  4:45 PM   Specimen: Abscess  Result Value Ref Range Status   Specimen Description   Final    ABSCESS GALL BLADDER Performed at [facility] [address] [place], [place] [address]    Special Requests   Final    NONE Performed at [facility], [place] [address], [place], [place] [address]    Gram Stain   Final    FEW WBC PRESENT, PREDOMINANTLY MONONUCLEAR FEW GRAM NEGATIVE RODS    Culture   Final    ABUNDANT GRAM NEGATIVE RODS IDENTIFICATION AND SUSCEPTIBILITIES TO FOLLOW CULTURE REINCUBATED FOR BETTER GROWTH Performed at [facility], [place] [address], [place], [place] [address]    Report Status PENDING  Incomplete      Studies: No results found.  Scheduled Meds:  acetaminophen  1,000 mg Oral Q6H   enoxaparin (LOVENOX) injection  40 mg Subcutaneous Q24H   gabapentin  300 mg Oral QHS    insulin aspart  0-9 Units Subcutaneous TID WC   lip balm  1 application Topical BID   melatonin  5 mg Oral QHS   multivitamin with minerals  1 tablet Oral Daily   omeprazole  40 mg Oral BID AC   polyethylene glycol  17 g Oral Daily   Ensure Max Protein  11 oz Oral BID   sodium chloride flush  5 mL Intracatheter Q8H    Continuous Infusions:  sodium chloride 100 mL/hr at 03/08/21 1513   lactated ringers     methocarbamol (ROBAXIN) IV 1,000 mg (03/07/21 2351)   piperacillin-tazobactam 3.375 g (03/08/21 1520)     LOS: 1 day     Alma Friendly, MD [facility]  If 7PM-7AM, please contact night-coverage [email or web address] 03/08/2021, 4:49 PM

## 2021-03-08 NOTE — Progress Notes (Signed)
Referring Physician(s): Gross,S  Supervising Physician: Ruthann Cancer  Patient Status:  [facility] - In-pt  Chief Complaint:  cholecystitis  Subjective: Pt doing ok this afternoon; sitting up in chair; denies worsening abd pain,N/V; BP a little soft   Allergies: Nsaids and Statins  Medications: Prior to Admission medications   Medication Sig Start Date End Date Taking? Authorizing Provider  gabapentin (NEURONTIN) 300 MG capsule TAKE 1 CAPSULE BY MOUTH 3 TIMES DAILY. Patient taking differently: Take 300 mg by mouth at bedtime. 06/19/20 06/19/21 Yes Lyndal Pulley, DO  Multiple Vitamins-Minerals (MULTIVITAMIN PO) Take 2 tablets by mouth daily.    Yes [provider]  omeprazole (PRILOSEC) 40 MG capsule Take 1 capsule by mouth 2 times daily before a meal. Open capsule and sprinkle in water, other liquid or applesauce 02/27/21  Yes Gatha Mayer, MD  ondansetron (ZOFRAN-ODT) 4 MG disintegrating tablet DISSOLVE 1 TABLET BY MOUTH EVERY 8 HOURS AS NEEDED FOR NAUSEA OR VOMITING (MAY TAKE 2 TABLETS IF 1 TABLET IS INEFFECTIVE) Patient taking differently: Take 4-8 mg by mouth every 8 (eight) hours as needed for nausea or vomiting. 06/26/20 06/26/21 Yes Gatha Mayer, MD  oxyCODONE (OXY IR/ROXICODONE) 5 MG immediate release tablet Take 1 tablet (5 mg total) by mouth every 4 (four) hours as needed for up to 5 days. Patient taking differently: Take 5 mg by mouth every 4 (four) hours as needed for severe pain. 03/05/21 03/10/21 Yes Gatha Mayer, MD  polyethylene glycol powder (MIRALAX) 17 GM/SCOOP powder Take 17 g by mouth daily. 02/20/21  Yes Hall, Carole N, DO  sucralfate (CARAFATE) 1 g tablet Take 1 tablet (1 g total) by mouth 4 (four) times daily -  with meals and at bedtime for 14 days. 02/20/21 03/06/21 Yes Hall, Lorenda Cahill, DO  albuterol (VENTOLIN HFA) 108 (90 Base) MCG/ACT inhaler Inhale 1-2 puffs into the lungs every 4 (four) hours as needed for wheezing or shortness of  breath. Patient not taking: Reported on 03/06/2021 01/10/20   Margarita Mail, PA-C  glucose blood (FREESTYLE LITE) test strip Use daily as instructed. 01/05/21   [name], MD     Vital Signs: BP 93/69 (BP Location: Left Arm)   Pulse 70   Temp 97.7 F (36.5 C) (Oral)   Resp 18   Ht 5' 2.01" (1.575 m)   SpO2 99%   BMI 26.88 kg/m   Physical Exam awake/alert; GB drain intact, dressing clean and dry,NT, OP 190 cc yesterday, 10-15 cc today turbid reddish beige fluid, drain irrigated but with minimal return  Imaging: CT Abdomen Pelvis W Contrast  Result Date: 03/06/2021 CLINICAL DATA:  Abdominal pain. Remote history of gastric bypass surgery. Recent endoscopy and biopsy. EXAM: CT ABDOMEN AND PELVIS WITH CONTRAST TECHNIQUE: Multidetector CT imaging of the abdomen and pelvis was performed using the standard protocol following bolus administration of intravenous contrast. CONTRAST:  38mL OMNIPAQUE IOHEXOL 350 MG/ML SOLN COMPARISON:  Recent CT scan 02/17/2021 FINDINGS: Lower chest: There are very small pleural effusions and bibasilar atelectasis. The heart is normal in size. Age advanced LAD calcifications are noted. Hepatobiliary: No hepatic lesions or intrahepatic biliary dilatation. I am unable to identify the gallbladder. It was present on the prior CT scan and there were several small layering gallstones noted. There is a large complex right upper quadrant abscess. Perforated cholecystitis with abscess is certainly a consideration. Normal caliber and course of the common bile duct. Pancreas: Mild mass effect on the pancreatic head by the  large right upper quadrant abscess no mass, inflammation or ductal dilatation. Spleen: Normal size. Stable splenic cysts. Adrenals/Urinary Tract: The adrenal glands and kidneys are unremarkable and stable. Small renal calculi again noted. The bladder is unremarkable. Stomach/Bowel: Surgical changes from gastric bypass surgery. I do not see any obvious complicating  features associated with the bypass. Contrast gets all the way to the colon without evidence of obstruction. There is moderate inflammation involving the hepatic flexure region of the colon but this is due to the surrounding abscess. Vascular/Lymphatic: The aorta and branch vessels are patent. Stable atherosclerotic calcifications. Borderline enlarged mesenteric and retroperitoneal lymph nodes are likely reactive. Reproductive: Surgically absent. Other: There is a large complex multifaceted right upper quadrant abscess measuring a maximum of 20 cm. Given its location and appearance I think it is most likely walled-off perforated cholecystitis. Musculoskeletal: No significant bony findings. Moderate degenerative changes involving the spine. IMPRESSION: 1. 20 cm complex right upper quadrant abscess. I think it is most likely walled-off perforated cholecystitis. 2. Surgical changes from gastric bypass surgery. No obvious complicating features associated with the bypass surgery. 3. Small pleural effusions and bibasilar atelectasis. 4. Age advanced LAD calcifications. These results were called by telephone at the time of interpretation on 03/06/2021 at 9:09 am to provider Silvano Rusk , who verbally acknowledged these results. Electronically Signed   By: Marijo Sanes M.D.   On: 03/06/2021 09:12   IR Perc Cholecystostomy  Result Date: 03/06/2021 INDICATION: 60 year old female presenting with acute cholecystitis. EXAM: Ultrasound and fluoroscopic guided percutaneous cholecystostomy tube placement. MEDICATIONS: The patient is receiving intravenous Zosyn as an inpatient with the last dose finishing approximately 1 hour prior to this procedure. ANESTHESIA/SEDATION: Moderate (conscious) sedation was employed during this procedure. A total of Versed 2 mg and Fentanyl 100 mcg was administered intravenously. Moderate Sedation Time: 19 minutes. The patient's level of consciousness and vital signs were monitored continuously by  radiology nursing throughout the procedure under my direct supervision. FLUOROSCOPY TIME:  Fluoroscopy Time: 1 minutes 0 seconds (6 mGy). COMPLICATIONS: None immediate. PROCEDURE: Informed written consent was obtained from the patient after a thorough discussion of the procedural risks, benefits and alternatives. All questions were addressed. Maximal Sterile Barrier Technique was utilized including caps, mask, sterile gowns, sterile gloves, sterile drape, hand hygiene and skin antiseptic. A timeout was performed prior to the initiation of the procedure. The right upper quadrant was prepped and draped in standard fashion. Preprocedure ultrasound evaluation demonstrated gas and fluid containing irregularly-shaped collection in the gallbladder fossa. The procedure was planned. Subdermal Local anesthesia was provided with 1% lidocaine. Deeper local anesthetic was administered under ultrasound guidance to the level of the liver capsule. Small skin nick was made. Under direct ultrasound visualization, an 18 gauge trocar needle was directed into the fluid collection/gallbladder. The inner stylet was removed and purulent aspirate was visualized. Small volume of contrast was injected demonstrating likely intraluminal position within the gallbladder, difficult to discern given severe distension irregularity. An Amplatz wire was inserted under fluoroscopic guidance which coiled in the gallbladder fossa. Serial dilation was performed followed by placement of a 10.2 French pigtail cholecystostomy tube. There was immediate release of gas followed by aspiration of 1 L of purulent material from the gallbladder. The catheter was coiled and locked then placed to bulb suction. A single, 0 silk retention suture was placed at the skin entry site. A sterile bandage was applied. The patient tolerated procedure well was transferred back to the floor in stable condition. IMPRESSION: Technically successful  percutaneous, transhepatic, 10.2  French cholecystostomy tube placement with copious gas and purulent fluid aspirate. PLAN: IR will arrange for cholecystostomy tube check and change in 8 weeks. Ruthann Cancer, MD [facility] Rad[facility] Electronically Signed   By: Ruthann Cancer M.D.   On: 03/06/2021 17:01    Labs:  CBC: Recent Labs    03/05/21 1426 03/06/21 1037 03/07/21 0505 03/08/21 0503  WBC 14.4* 14.7* 26.0* 12.2*  HGB 12.1 11.0* 10.8* 10.2*  HCT 36.5 33.1* 32.2* 31.8*  PLT 701.0* 634* 532* 509*    COAGS: Recent Labs    03/06/21 1947  INR 1.2    BMP: Recent Labs    02/20/21 0616 03/05/21 1426 03/06/21 1037 03/07/21 0505 03/08/21 0503  NA 134* 136 131* 136 138  K 3.7 3.2* 3.0* 3.9 2.9*  CL 97* 92* 90* 94* 102  CO2 30 34* 29 30 25   GLUCOSE 144* 157* 155* 172* 110*  BUN 15 13 8 12 16   CALCIUM 8.9 8.9 8.3* 8.4* 8.2*  CREATININE 0.57 0.40 0.35* 0.58 0.55  GFRNONAA >60  --  >60 >60 >60    LIVER FUNCTION TESTS: Recent Labs    02/20/21 0616 03/05/21 1426 03/06/21 1037 03/07/21 0505  BILITOT 0.9 0.7 0.9 0.7  AST 19 31 27  38  ALT 24 27 27 25   ALKPHOS 60 150* 143* 145*  PROT 5.9* 6.8 6.4* 5.8*  ALBUMIN 2.6* 3.1* 2.3* 2.0*    Assessment and Plan: Pt with hx acute walled off perf cholecystitis; s/p perc cholecystostomy 11/8; afebrile; WBC 12.2(26), hgb 10.2(11), creat nl, K 2.9- replace; drain fluid cx pend; cont with drain irrigation, close OP monitoring, check final cx data; will set up for injection study and exchange in 8 weeks  Electronically Signed: D. Rowe Robert, PA-C 03/08/2021, 2:24 PM   I spent a total of 15 Minutes at the the patient's bedside AND on the patient's hospital floor or unit, greater than 50% of which was counseling/coordinating care for gallbladder drain    Patient ID: Sandra [name], female   DOB: Nov 10, 1960, 60 y.o.   MRN: [id number]

## 2021-03-08 NOTE — Plan of Care (Signed)
  Problem: Education: Goal: Knowledge of General Education information will improve Description: Including pain rating scale, medication(s)/side effects and non-pharmacologic comfort measures Outcome: Progressing   Problem: Health Behavior/Discharge Planning: Goal: Ability to manage health-related needs will improve Outcome: Progressing   Problem: Clinical Measurements: Goal: Ability to maintain clinical measurements within normal limits will improve Outcome: Progressing Goal: Will remain free from infection Outcome: Progressing Goal: Diagnostic test results will improve Outcome: Progressing Goal: Cardiovascular complication will be avoided Outcome: Progressing   Problem: Nutrition: Goal: Adequate nutrition will be maintained Outcome: Progressing   Problem: Pain Managment: Goal: General experience of comfort will improve Outcome: Progressing   Problem: Safety: Goal: Ability to remain free from injury will improve Outcome: Progressing   Problem: Skin Integrity: Goal: Risk for impaired skin integrity will decrease Outcome: Progressing

## 2021-03-09 ENCOUNTER — Other Ambulatory Visit (HOSPITAL_COMMUNITY): Payer: Self-pay

## 2021-03-09 DIAGNOSIS — E876 Hypokalemia: Secondary | ICD-10-CM

## 2021-03-09 LAB — CBC WITH DIFFERENTIAL/PLATELET
Abs Immature Granulocytes: 0.29 10*3/uL — ABNORMAL HIGH (ref 0.00–0.07)
Basophils Absolute: 0.1 10*3/uL (ref 0.0–0.1)
Basophils Relative: 1 %
Eosinophils Absolute: 0.1 10*3/uL (ref 0.0–0.5)
Eosinophils Relative: 1 %
HCT: 31.6 % — ABNORMAL LOW (ref 36.0–46.0)
Hemoglobin: 10.1 g/dL — ABNORMAL LOW (ref 12.0–15.0)
Immature Granulocytes: 4 %
Lymphocytes Relative: 30 %
Lymphs Abs: 2.1 10*3/uL (ref 0.7–4.0)
MCH: 30.4 pg (ref 26.0–34.0)
MCHC: 32 g/dL (ref 30.0–36.0)
MCV: 95.2 fL (ref 80.0–100.0)
Monocytes Absolute: 0.6 10*3/uL (ref 0.1–1.0)
Monocytes Relative: 9 %
Neutro Abs: 3.9 10*3/uL (ref 1.7–7.7)
Neutrophils Relative %: 55 %
Platelets: 503 10*3/uL — ABNORMAL HIGH (ref 150–400)
RBC: 3.32 MIL/uL — ABNORMAL LOW (ref 3.87–5.11)
RDW: 14.1 % (ref 11.5–15.5)
WBC: 7 10*3/uL (ref 4.0–10.5)
nRBC: 0 % (ref 0.0–0.2)

## 2021-03-09 LAB — BASIC METABOLIC PANEL
Anion gap: 10 (ref 5–15)
BUN: 15 mg/dL (ref 8–23)
CO2: 24 mmol/L (ref 22–32)
Calcium: 8.1 mg/dL — ABNORMAL LOW (ref 8.9–10.3)
Chloride: 107 mmol/L (ref 98–111)
Creatinine, Ser: 0.46 mg/dL (ref 0.44–1.00)
GFR, Estimated: >60 mL/min (ref >60)
Glucose, Bld: 91 mg/dL (ref 70–99)
Potassium: 3.4 mmol/L — ABNORMAL LOW (ref 3.5–5.1)
Sodium: 141 mmol/L (ref 135–145)

## 2021-03-09 LAB — AEROBIC/ANAEROBIC CULTURE W GRAM STAIN (SURGICAL/DEEP WOUND)

## 2021-03-09 LAB — MAGNESIUM: Magnesium: 2.1 mg/dL (ref 1.7–2.4)

## 2021-03-09 LAB — GLUCOSE, CAPILLARY
Glucose-Capillary: 88 mg/dL (ref 70–99)
Glucose-Capillary: 103 mg/dL — ABNORMAL HIGH (ref 70–99)

## 2021-03-09 MED ORDER — GABAPENTIN 300 MG PO CAPS: 300.0000 mg | ORAL_CAPSULE | Freq: Every day | ORAL | Status: DC

## 2021-03-09 MED ORDER — POTASSIUM CHLORIDE CRYS ER 20 MEQ PO TBCR
40.0000 meq | EXTENDED_RELEASE_TABLET | Freq: Once | ORAL | Status: AC
  Administered 2021-03-09: 40 meq via ORAL
  Filled 2021-03-09: qty 2

## 2021-03-09 MED ORDER — ENSURE MAX PROTEIN PO LIQD
11.0000 [oz_av] | Freq: Two times a day (BID) | ORAL | 0 refills | Status: AC
  Filled 2021-03-09: qty 19800, 30d supply, fill #0

## 2021-03-09 MED ORDER — AMOXICILLIN-POT CLAVULANATE 875-125 MG PO TABS
1.0000 | ORAL_TABLET | Freq: Two times a day (BID) | ORAL | 0 refills | Status: AC
  Filled 2021-03-09: qty 20, 10d supply, fill #0

## 2021-03-09 NOTE — Progress Notes (Addendum)
Pt discharging home. AVS printed and educational completed with teach back method. PIV removed. VSS. Rx meds delivered to bedside. Pt discharging home with drain. Supplies given to pt. No further questions at this time.

## 2021-03-09 NOTE — Progress Notes (Signed)
Patient ID: Sandra [name], female   DOB: 12/05/1960, 60 y.o.   MRN: [id number] [facility] Surgery Progress Note     Subjective: CC-  Up in chair. Feeling better each day. She reports soreness around drain otherwise no significant abdominal pain. Tolerating diet without n/v. Passing flatus and had a BM. WBC WNL  Objective: Vital signs in last 24 hours: Temp:  [97.4 F (36.3 C)-97.7 F (36.5 C)] 97.6 F (36.4 C) (11/11 0440) Pulse Rate:  [65-78] 65 (11/11 0440) Resp:  [17-18] 17 (11/11 0440) BP: (93-108)/(69-76) 108/71 (11/11 0440) SpO2:  [97 %-99 %] 97 % (11/11 0440) Last BM Date: 03/08/21  Intake/Output from previous day: 11/10 0701 - 11/11 0700 In: 836 [P.O.:826] Out: 1710 [Urine:1700; Drains:10] Intake/Output this shift: Total I/O In: -  Out: 400 [Urine:400]  PE: Gen:  Alert, NAD, pleasant Pulm: rate and effort normal Abd: Soft, minimal distension, mild TTP around drain, no diffuse tenderness, drain with purulent fluid  Lab Results:  Recent Labs    03/08/21 0503 03/09/21 0446  WBC 12.2* 7.0  HGB 10.2* 10.1*  HCT 31.8* 31.6*  PLT 509* 503*   BMET Recent Labs    03/08/21 0503 03/09/21 0446  NA 138 141  K 2.9* 3.4*  CL 102 107  CO2 25 24  GLUCOSE 110* 91  BUN 16 15  CREATININE 0.55 0.46  CALCIUM 8.2* 8.1*   PT/INR Recent Labs    03/06/21 1947  LABPROT 15.0  INR 1.2   CMP     Component Value Date/Time   NA 141 03/09/2021 0446   K 3.4 (L) 03/09/2021 0446   CL 107 03/09/2021 0446   CO2 24 03/09/2021 0446   GLUCOSE 91 03/09/2021 0446   BUN 15 03/09/2021 0446   CREATININE 0.46 03/09/2021 0446   CREATININE 0.58 10/22/2016 1158   CALCIUM 8.1 (L) 03/09/2021 0446   PROT 5.8 (L) 03/07/2021 0505   ALBUMIN 2.0 (L) 03/07/2021 0505   AST 38 03/07/2021 0505   ALT 25 03/07/2021 0505   ALKPHOS 145 (H) 03/07/2021 0505   BILITOT 0.7 03/07/2021 0505   GFRNONAA >60 03/09/2021 0446   GFRAA >60 10/16/2017 0842   Lipase     Component Value  Date/Time   LIPASE 23 03/06/2021 1037       Studies/Results: No results found.  Anti-infectives: Anti-infectives (From admission, onward)    Start     Dose/Rate Route Frequency Ordered Stop   03/06/21 2200  piperacillin-tazobactam (ZOSYN) IVPB 3.375 g        3.375 g 12.5 mL/hr over 240 Minutes Intravenous Every 8 hours 03/06/21 1909     03/06/21 1400  piperacillin-tazobactam (ZOSYN) IVPB 3.375 g        3.375 g 12.5 mL/hr over 240 Minutes Intravenous  Once 03/06/21 1349 03/06/21 1514        Assessment/Plan Perforated cholecystitis with RUQ abscess - s/p IR Cholecystostomy tube placement 11/8 - WBC normalized, VSS - gram stain with GNRs, culture pending. Blood cultures also pending. Continue IV zosyn while admitted - continue drain - ok for discharge from surgical standpoint. Recommend 10 days of augmentin at discharge. Patient will need follow up with IR. I will arrange follow up with Dr. 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 Lucia Gaskins, known non-bleeding marginal ulcer  CAD PMH endometrial cancer s/p TAH 2006 Severe protein calorie malnutrition - prealbumin<5, will  ask dietician to consult   LOS: 2 days    Wellington Hampshire, [facility] Surgery 03/09/2021, 10:21 AM Please see Amion for pager number during day hours 7:00am-4:30pm

## 2021-03-09 NOTE — Progress Notes (Signed)
Referring Physician(s): EDP/CCS  Supervising Physician: Mir, Sharen Heck  Patient Status:  [facility] - In-pt  Chief Complaint:  Acute cholecystitis with abscess formation, s/p perc chole placement with Dr. Serafina Royals on 11/8   Subjective:  Pt sitting in chair, NAD. RN at the bedside.  She states that she feels great, and she may be discharged today per surgery.  Denies N/V/ abdominal pain.   Allergies: Nsaids and Statins  Medications: Prior to Admission medications   Medication Sig Start Date End Date Taking? Authorizing Provider  gabapentin (NEURONTIN) 300 MG capsule TAKE 1 CAPSULE BY MOUTH 3 TIMES DAILY. Patient taking differently: Take 300 mg by mouth at bedtime. 06/19/20 06/19/21 Yes Lyndal Pulley, DO  Multiple Vitamins-Minerals (MULTIVITAMIN PO) Take 2 tablets by mouth daily.    Yes [provider]  omeprazole (PRILOSEC) 40 MG capsule Take 1 capsule by mouth 2 times daily before a meal. Open capsule and sprinkle in water, other liquid or applesauce 02/27/21  Yes Gatha Mayer, MD  ondansetron (ZOFRAN-ODT) 4 MG disintegrating tablet DISSOLVE 1 TABLET BY MOUTH EVERY 8 HOURS AS NEEDED FOR NAUSEA OR VOMITING (MAY TAKE 2 TABLETS IF 1 TABLET IS INEFFECTIVE) Patient taking differently: Take 4-8 mg by mouth every 8 (eight) hours as needed for nausea or vomiting. 06/26/20 06/26/21 Yes Gatha Mayer, MD  oxyCODONE (OXY IR/ROXICODONE) 5 MG immediate release tablet Take 1 tablet (5 mg total) by mouth every 4 (four) hours as needed for up to 5 days. Patient taking differently: Take 5 mg by mouth every 4 (four) hours as needed for severe pain. 03/05/21 03/10/21 Yes Gatha Mayer, MD  polyethylene glycol powder (MIRALAX) 17 GM/SCOOP powder Take 17 g by mouth daily. 02/20/21  Yes Hall, Carole N, DO  sucralfate (CARAFATE) 1 g tablet Take 1 tablet (1 g total) by mouth 4 (four) times daily -  with meals and at bedtime for 14 days. 02/20/21 03/06/21 Yes Hall, Lorenda Cahill, DO  albuterol (VENTOLIN  HFA) 108 (90 Base) MCG/ACT inhaler Inhale 1-2 puffs into the lungs every 4 (four) hours as needed for wheezing or shortness of breath. Patient not taking: Reported on 03/06/2021 01/10/20   Margarita Mail, PA-C  glucose blood (FREESTYLE LITE) test strip Use daily as instructed. 01/05/21   [name], MD     Vital Signs: BP 108/71 (BP Location: Left Arm)   Pulse 65   Temp 97.6 F (36.4 C) (Oral)   Resp 17   Ht 5' 2.01" (1.575 m)   SpO2 97%   BMI 26.88 kg/m   Physical Exam Vitals reviewed.  Constitutional:      General: She is not in acute distress.    Appearance: Normal appearance. She is not ill-appearing.  HENT:     Head: Normocephalic and atraumatic.  Pulmonary:     Effort: Pulmonary effort is normal.  Abdominal:     General: Abdomen is flat.     Palpations: Abdomen is soft.  Musculoskeletal:     Cervical back: Normal range of motion and neck supple.  Skin:    General: Skin is warm and dry.     Coloration: Skin is not jaundiced or pale.     Comments: Positive RUQ drain to a suction bulb. Site is unremarkable with no erythema, edema, tenderness, bleeding or drainage. Suture and stat lock in place. Dressing is clean, dry, and intact. 20 ml of  purulent fluid noted in the bulb. Drain aspirates and flushes well.    Neurological:  Mental Status: She is alert and oriented to person, place, and time.  Psychiatric:        Mood and Affect: Mood normal.        Behavior: Behavior normal.    Imaging: CT Abdomen Pelvis W Contrast  Result Date: 03/06/2021 CLINICAL DATA:  Abdominal pain. Remote history of gastric bypass surgery. Recent endoscopy and biopsy. EXAM: CT ABDOMEN AND PELVIS WITH CONTRAST TECHNIQUE: Multidetector CT imaging of the abdomen and pelvis was performed using the standard protocol following bolus administration of intravenous contrast. CONTRAST:  32mL OMNIPAQUE IOHEXOL 350 MG/ML SOLN COMPARISON:  Recent CT scan 02/17/2021 FINDINGS: Lower chest: There are very  small pleural effusions and bibasilar atelectasis. The heart is normal in size. Age advanced LAD calcifications are noted. Hepatobiliary: No hepatic lesions or intrahepatic biliary dilatation. I am unable to identify the gallbladder. It was present on the prior CT scan and there were several small layering gallstones noted. There is a large complex right upper quadrant abscess. Perforated cholecystitis with abscess is certainly a consideration. Normal caliber and course of the common bile duct. Pancreas: Mild mass effect on the pancreatic head by the large right upper quadrant abscess no mass, inflammation or ductal dilatation. Spleen: Normal size. Stable splenic cysts. Adrenals/Urinary Tract: The adrenal glands and kidneys are unremarkable and stable. Small renal calculi again noted. The bladder is unremarkable. Stomach/Bowel: Surgical changes from gastric bypass surgery. I do not see any obvious complicating features associated with the bypass. Contrast gets all the way to the colon without evidence of obstruction. There is moderate inflammation involving the hepatic flexure region of the colon but this is due to the surrounding abscess. Vascular/Lymphatic: The aorta and branch vessels are patent. Stable atherosclerotic calcifications. Borderline enlarged mesenteric and retroperitoneal lymph nodes are likely reactive. Reproductive: Surgically absent. Other: There is a large complex multifaceted right upper quadrant abscess measuring a maximum of 20 cm. Given its location and appearance I think it is most likely walled-off perforated cholecystitis. Musculoskeletal: No significant bony findings. Moderate degenerative changes involving the spine. IMPRESSION: 1. 20 cm complex right upper quadrant abscess. I think it is most likely walled-off perforated cholecystitis. 2. Surgical changes from gastric bypass surgery. No obvious complicating features associated with the bypass surgery. 3. Small pleural effusions and  bibasilar atelectasis. 4. Age advanced LAD calcifications. These results were called by telephone at the time of interpretation on 03/06/2021 at 9:09 am to provider Silvano Rusk , who verbally acknowledged these results. Electronically Signed   By: Marijo Sanes M.D.   On: 03/06/2021 09:12   IR Perc Cholecystostomy  Result Date: 03/06/2021 INDICATION: 60 year old female presenting with acute cholecystitis. EXAM: Ultrasound and fluoroscopic guided percutaneous cholecystostomy tube placement. MEDICATIONS: The patient is receiving intravenous Zosyn as an inpatient with the last dose finishing approximately 1 hour prior to this procedure. ANESTHESIA/SEDATION: Moderate (conscious) sedation was employed during this procedure. A total of Versed 2 mg and Fentanyl 100 mcg was administered intravenously. Moderate Sedation Time: 19 minutes. The patient's level of consciousness and vital signs were monitored continuously by radiology nursing throughout the procedure under my direct supervision. FLUOROSCOPY TIME:  Fluoroscopy Time: 1 minutes 0 seconds (6 mGy). COMPLICATIONS: None immediate. PROCEDURE: Informed written consent was obtained from the patient after a thorough discussion of the procedural risks, benefits and alternatives. All questions were addressed. Maximal Sterile Barrier Technique was utilized including caps, mask, sterile gowns, sterile gloves, sterile drape, hand hygiene and skin antiseptic. A timeout was performed prior to the initiation  of the procedure. The right upper quadrant was prepped and draped in standard fashion. Preprocedure ultrasound evaluation demonstrated gas and fluid containing irregularly-shaped collection in the gallbladder fossa. The procedure was planned. Subdermal Local anesthesia was provided with 1% lidocaine. Deeper local anesthetic was administered under ultrasound guidance to the level of the liver capsule. Small skin nick was made. Under direct ultrasound visualization, an 18  gauge trocar needle was directed into the fluid collection/gallbladder. The inner stylet was removed and purulent aspirate was visualized. Small volume of contrast was injected demonstrating likely intraluminal position within the gallbladder, difficult to discern given severe distension irregularity. An Amplatz wire was inserted under fluoroscopic guidance which coiled in the gallbladder fossa. Serial dilation was performed followed by placement of a 10.2 French pigtail cholecystostomy tube. There was immediate release of gas followed by aspiration of 1 L of purulent material from the gallbladder. The catheter was coiled and locked then placed to bulb suction. A single, 0 silk retention suture was placed at the skin entry site. A sterile bandage was applied. The patient tolerated procedure well was transferred back to the floor in stable condition. IMPRESSION: Technically successful percutaneous, transhepatic, 10.2 French cholecystostomy tube placement with copious gas and purulent fluid aspirate. PLAN: IR will arrange for cholecystostomy tube check and change in 8 weeks. Ruthann Cancer, MD [facility] Radiology Electronically Signed   By: Ruthann Cancer M.D.   On: 03/06/2021 17:01    Labs:  CBC: Recent Labs    03/06/21 1037 03/07/21 0505 03/08/21 0503 03/09/21 0446  WBC 14.7* 26.0* 12.2* 7.0  HGB 11.0* 10.8* 10.2* 10.1*  HCT 33.1* 32.2* 31.8* 31.6*  PLT 634* 532* 509* 503*    COAGS: Recent Labs    03/06/21 1947  INR 1.2    BMP: Recent Labs    03/06/21 1037 03/07/21 0505 03/08/21 0503 03/09/21 0446  NA 131* 136 138 141  K 3.0* 3.9 2.9* 3.4*  CL 90* 94* 102 107  CO2 29 30 25 24   GLUCOSE 155* 172* 110* 91  BUN 8 12 16 15   CALCIUM 8.3* 8.4* 8.2* 8.1*  CREATININE 0.35* 0.58 0.55 0.46  GFRNONAA >60 >60 >60 >60    LIVER FUNCTION TESTS: Recent Labs    02/20/21 0616 03/05/21 1426 03/06/21 1037 03/07/21 0505  BILITOT 0.9 0.7 0.9  0.7  AST 19 31 27  38  ALT 24 27 27 25   ALKPHOS 60 150* 143* 145*  PROT 5.9* 6.8 6.4* 5.8*  ALBUMIN 2.6* 3.1* 2.3* 2.0*    Assessment and Plan:  60 y.o. female with 20 cm complex right upper quadrant abscess, most likely walled-off perforated cholecystitis; s/p aspiration (1L was aspirated) and perc chole drain placement with Dr. Serafina Royals on 11/8.   Drain stable. OP 10 cc yesterday, 20 cc this morning.  WBC normalized today. VSS  Drain Location: RUQ Size: Fr size: 10 Fr Date of placement: 03/06/21 - Dr. Serafina Royals   Currently to: Drain collection device: suction bulb 24 hour output:  Output by Drain (mL) 03/07/21 0701 - 03/07/21 1900 03/07/21 1901 - 03/08/21 0700 03/08/21 0701 - 03/08/21 1900 03/08/21 1901 - 03/09/21 0700 03/09/21 0701 - 03/09/21 0841  Closed System Drain 1 Right RUQ Bulb (JP) 10.2 Fr. 90 100 10  0    Interval imaging/drain manipulation:  None   Current examination: Flushes/aspirates easily.  Insertion site unremarkable. Suture and stat lock in place. Dressed appropriately.   Plan: Continue TID flushes with 5 cc NS. Record output  Q shift. Dressing changes QD or PRN if soiled.  Call IR APP or on call IR MD if difficulty flushing or sudden change in drain output.  Repeat imaging/possible drain injection once output < 10 mL/QD (excluding flush material.)  Discharge planning: Please contact IR APP or on call IR MD prior to patient d/c to ensure appropriate follow up plans are in place. Typically patient will follow up with IR clinic 6-8 weeks post d/c (unless the gallbladder is removed by surgery interm) for repeat imaging/possible drain injection.  IR scheduler will contact patient with date/time of appointment. Patient will need to flush drain QD with 5 cc NS, record output QD, dressing changes every 2-3 days or earlier if soiled.   Discussed with Dr. Dwaine Gale regarding low OP and possible d/c today. Drain was changed from suction bulb to a gravity bag.  Informed the  patient that drain clinic scheduler will call her to schedule follow up visit in 6 weeks, encouraged the patient not to hesitate IR for questions and concerns.  Outpatient orders in.   IR will continue to follow - please call with questions or concerns.  Further treatment plan per CCS/TR[facility] Appreciate and agree with the plan.    Electronically Signed: Tera Mater, PA-C 03/09/2021, 8:36 AM   I spent a total of 25 Minutes at the the patient's bedside AND on the patient's hospital floor or unit, greater than 50% of which was counseling/coordinating care for perc chole drain.   This chart was dictated using voice recognition software.  Despite best efforts to proofread,  errors can occur which can change the documentation meaning.

## 2021-03-09 NOTE — Discharge Summary (Signed)
Discharge Summary  Sandra [name] GBT:[id number] DOB: 1960-08-19  PCP: [name], MD  Admit date: 03/06/2021 Discharge date: 03/09/2021  Time spent: 40 mins  Recommendations for Outpatient Follow-up:  Follow up with PCP in 1 week Follow up with General surgery as scheduled Follow up with IR as scheduled   Discharge Diagnoses:  Active Hospital Problems   Diagnosis Date Noted   Intra-abdominal abscess ([place]) 03/06/2021   Normocytic anemia 03/06/2021   Hypokalemia 03/06/2021   Hyponatremia 03/06/2021   Thrombocytosis 03/06/2021   Moderate protein-calorie malnutrition ([place]) 03/06/2021   Coronary atherosclerosis of native coronary artery 08/27/2011   Hypercholesterolemia 08/15/2011   Type 2 diabetes mellitus ([place]) 08/15/2011    Resolved Hospital Problems  No resolved problems to display.    Discharge Condition: Stable  Diet recommendation: Bariatric diet  Vitals:   03/08/21 2044 03/09/21 0440  BP: 105/76 108/71  Pulse: 78 65  Resp: 18 17  Temp: (!) 97.4 F (36.3 C) 97.6 F (36.4 C)  SpO2: 97% 97%    History of present illness:  Sandra [name] is a 60 y.o. female with medical history significant of anxiety, depression, osteoarthritis, CAD, type II DM diet-controlled, gastrojejunal ulcer, GERD, history of gastric bypass, hyperlipidemia, hypertension, sleep apnea no longer on CPAP after gastric bypass surgery was admitted and discharged from 02/17/2021 until 02/20/2021 due to right upper quadrant pain associated with nausea with negative work-up.  She continued to have pain after being discharged and she called Dr. Carlean Purl who ordered a CT abdomen/pelvis which showed a RUQ abscess. In the ED, VSS, labs notable for leukocytosis, hypokalemia, otherwise stable.  General surgery and IR was consulted.  IR placed drain on 11/8. Patient admitted for further management.   Today, pt denies any new complaints, reports feeling much better. Minimal soreness around drain  site, otherwise denies any n/v, fever/chills, chest pain, SOB.    Hospital Course:  Principal Problem:   Intra-abdominal abscess ([place]) Active Problems:   Type 2 diabetes mellitus ([place])   Hypercholesterolemia   Coronary atherosclerosis of native coronary artery   Normocytic anemia   Hypokalemia   Hyponatremia   Thrombocytosis   Moderate protein-calorie malnutrition ([facility])   Perforated cholecystitis with RUQ intra-abdominal abscess S/p IR cholecystostomy tube placement on 11/8 Currently afebrile, with resolved leukocytosis BC X 2 NGTD Drain abscess culture showing abundant E.coli General surgery and IR consulted S/P IV Zosyn--> switch to PO Augmentin for a total of 10 days  (as recommended by general surgery). If any decline in clinical status, may switch to keflex as its more sensitive Continue bariatric diet Follow with gen surgery and IR for further drain [name] needs and removal in about 6 weeks. (Patient and daughter have been educated about drain [name], supplies given to patient)   Hypokalemia Replaced prn   Type 2 diabetes mellitus Last A1c on 01/2021 showed 6.4 Not on any meds Follow up with PCP   CAD Currently chest pain free Follow-up with PCP    Normocytic anemia/Thrombocytosis Follow up with PCP  Hx of laparoscopic Roux-en-Y gastric bypass  Performed on 09/2013 Dr. Lucia Gaskins, with known non-bleeding marginal ulcer    Hx of endometrial cancer s/p TAH 2006    Malnutrition Type:  Nutrition Problem: Inadequate oral intake Etiology: acute illness   Malnutrition Characteristics:  Signs/Symptoms: per patient/family report   Nutrition Interventions:  Interventions: Premier Protein, MVI   Estimated body mass index is 26.9 kg/m as calculated from the following:   Height as of this encounter:  5\' 2"  (1.575 m).   Weight as of this encounter: 66.7 kg.    Procedures: Drain placement on 03/06/2021 by IR  Consultations: Gen surgery IR  Discharge Exam: BP  108/71 (BP Location: Left Arm)   Pulse 65   Temp 97.6 F (36.4 C) (Oral)   Resp 17   Ht 5\' 2"  (1.575 m)   Wt 66.7 kg   SpO2 97%   BMI 26.90 kg/m   General: NAD  Cardiovascular: S1, S2 present Respiratory: CTAB Abdomen: Soft, tender around drain, nondistended, bowel sounds present, drain noted Musculoskeletal: No bilateral pedal edema noted Skin: Normal Psychiatry: Normal mood   Discharge Instructions You were cared for by a hospitalist during your hospital stay. If you have any questions about your discharge medications or the [name] you received while you were in the hospital after you are discharged, you can call the unit and asked to speak with the hospitalist on call if the hospitalist that took [name] of you is not available. Once you are discharged, your primary [name] physician will handle any further medical issues. Please note that NO REFILLS for any discharge medications will be authorized once you are discharged, as it is imperative that you return to your primary [name] physician (or establish a relationship with a primary [name] physician if you do not have one) for your aftercare needs so that they can reassess your need for medications and monitor your lab values.   Allergies as of 03/09/2021       Reactions   Nsaids    r/t gastric surgery   Statins         Medication List     STOP taking these medications    sucralfate 1 g tablet Commonly known as: CARAFATE       TAKE these medications    albuterol 108 (90 Base) MCG/ACT inhaler Commonly known as: VENTOLIN HFA Inhale 1-2 puffs into the lungs every 4 (four) hours as needed for wheezing or shortness of breath.   amoxicillin-clavulanate 875-125 MG tablet Commonly known as: Augmentin Take 1 tablet by mouth 2 (two) times daily for 10 days.   Ensure Max Protein Liqd Take 330 mLs (11 oz total) by mouth 2 (two) times daily.   FREESTYLE LITE test strip Generic drug: glucose blood Use daily as instructed.    gabapentin 300 MG capsule Commonly known as: NEURONTIN Take 1 capsule (300 mg total) by mouth at bedtime.   MULTIVITAMIN PO Take 2 tablets by mouth daily.   omeprazole 40 MG capsule Commonly known as: PRILOSEC Take 1 capsule by mouth 2 times daily before a meal. Open capsule and sprinkle in water, other liquid or applesauce   ondansetron 4 MG disintegrating tablet Commonly known as: ZOFRAN-ODT DISSOLVE 1 TABLET BY MOUTH EVERY 8 HOURS AS NEEDED FOR NAUSEA OR VOMITING (MAY TAKE 2 TABLETS IF 1 TABLET IS INEFFECTIVE) What changed:  how much to take how to take this when to take this reasons to take this   oxyCODONE 5 MG immediate release tablet Commonly known as: Oxy IR/ROXICODONE Take 1 tablet (5 mg total) by mouth every 4 (four) hours as needed for up to 5 days. What changed: reasons to take this   polyethylene glycol powder 17 GM/SCOOP powder Commonly known as: MiraLax Take 17 g by mouth daily.       Allergies  Allergen Reactions   Nsaids     r/t gastric surgery   Statins     Follow-up Information  Michael Boston, MD. Go on 03/26/2021.   Specialties: General Surgery, Colon and Rectal Surgery Why: Your appointment is 03/26/21 at 3:00pm Please arrive 30 minutes prior to your appointment to check in and fill out paperwork. bring photo ID and insurance information. Contact information: [address] [place] [address] [phone]         [name], MD. Schedule an appointment as soon as possible for a visit in 1 week(s).   Specialty: Internal Medicine Contact information: [place] [address] [phone]                  The results of significant diagnostics from this hospitalization (including imaging, microbiology, ancillary and laboratory) are listed below for reference.    Significant Diagnostic Studies: CT Abdomen Pelvis W Contrast  Result Date: 03/06/2021 CLINICAL DATA:  Abdominal pain. Remote  history of gastric bypass surgery. Recent endoscopy and biopsy. EXAM: CT ABDOMEN AND PELVIS WITH CONTRAST TECHNIQUE: Multidetector CT imaging of the abdomen and pelvis was performed using the standard protocol following bolus administration of intravenous contrast. CONTRAST:  10mL OMNIPAQUE IOHEXOL 350 MG/ML SOLN COMPARISON:  Recent CT scan 02/17/2021 FINDINGS: Lower chest: There are very small pleural effusions and bibasilar atelectasis. The heart is normal in size. Age advanced LAD calcifications are noted. Hepatobiliary: No hepatic lesions or intrahepatic biliary dilatation. I am unable to identify the gallbladder. It was present on the prior CT scan and there were several small layering gallstones noted. There is a large complex right upper quadrant abscess. Perforated cholecystitis with abscess is certainly a consideration. Normal caliber and course of the common bile duct. Pancreas: Mild mass effect on the pancreatic head by the large right upper quadrant abscess no mass, inflammation or ductal dilatation. Spleen: Normal size. Stable splenic cysts. Adrenals/Urinary Tract: The adrenal glands and kidneys are unremarkable and stable. Small renal calculi again noted. The bladder is unremarkable. Stomach/Bowel: Surgical changes from gastric bypass surgery. I do not see any obvious complicating features associated with the bypass. Contrast gets all the way to the colon without evidence of obstruction. There is moderate inflammation involving the hepatic flexure region of the colon but this is due to the surrounding abscess. Vascular/Lymphatic: The aorta and branch vessels are patent. Stable atherosclerotic calcifications. Borderline enlarged mesenteric and retroperitoneal lymph nodes are likely reactive. Reproductive: Surgically absent. Other: There is a large complex multifaceted right upper quadrant abscess measuring a maximum of 20 cm. Given its location and appearance I think it is most likely walled-off  perforated cholecystitis. Musculoskeletal: No significant bony findings. Moderate degenerative changes involving the spine. IMPRESSION: 1. 20 cm complex right upper quadrant abscess. I think it is most likely walled-off perforated cholecystitis. 2. Surgical changes from gastric bypass surgery. No obvious complicating features associated with the bypass surgery. 3. Small pleural effusions and bibasilar atelectasis. 4. Age advanced LAD calcifications. These results were called by telephone at the time of interpretation on 03/06/2021 at 9:09 am to provider Silvano Rusk , who verbally acknowledged these results. Electronically Signed   By: Marijo Sanes M.D.   On: 03/06/2021 09:12   CT Abdomen Pelvis W Contrast  Result Date: 02/17/2021 CLINICAL DATA:  Bowel obstruction suspected Abdominal pain.  Episode of vomiting. EXAM: CT ABDOMEN AND PELVIS WITH CONTRAST TECHNIQUE: Multidetector CT imaging of the abdomen and pelvis was performed using the standard protocol following bolus administration of intravenous contrast. CONTRAST:  154mL OMNIPAQUE IOHEXOL 300 MG/ML  SOLN COMPARISON:  Noncontrast CT  08/19/2014 FINDINGS: Lower chest: No acute airspace disease or pleural effusion. Heart size normal. Hepatobiliary: No focal hepatic lesion. Layering stones or sludge in the gallbladder which is mildly distended. There is slight indistinctness of the pericholecystic fat. No visualized choledocholithiasis. Upper normal common bile duct measuring 7 mm at the porta hepatis. Pancreas: No ductal dilatation or inflammation. Single calcification in the pancreatic body. Spleen: Normal in size. Lobulated hypodensity in the periphery of the inferior spleen, likely a hemangioma. Adrenals/Urinary Tract: Slight adrenal thickening without dominant nodule. Small nonobstructing stone in the lower right kidney. Punctate nonobstructing stone in the mid lower left kidney. No hydronephrosis. Homogeneous renal enhancement. Symmetric excretion on  delayed phase imaging. Tiny cortical hypodensities in both kidneys are too small to characterize but likely small cyst. Partially distended urinary bladder with wall thickening at the dome. Stomach/Bowel: Mild wall thickening of the distal esophagus. Gastric bypass anatomy. Minimal fluid in the excluded gastric remnant. The Roux limb is nondilated, but redundant. Jejunal anastomosis is minimally patulous. There is no bowel obstruction, inflammation, pneumatosis or ischemia. Few fluid-filled loops of small bowel in the pelvis. Normal appendix. Small volume of formed stool in the colon. The sigmoid colon is redundant. No colonic inflammation. Vascular/Lymphatic: Mild aortic atherosclerosis. No aortic aneurysm. Patent portal vein. No enlarged lymph nodes in the abdomen or pelvis. Reproductive: Hysterectomy.  No adnexal mass. Other: No free air, free fluid or focal fluid collection. No abdominal wall hernia. Musculoskeletal: Lower lumbar facet hypertrophy. Grade 1 anterolisthesis of L4 on L5 is likely facet mediated. There are no acute or suspicious osseous abnormalities. Incidental bone island in the pelvis. IMPRESSION: 1. Gastric bypass anatomy without bowel obstruction or inflammation. The Roux limb is redundant. 2. Layering stones or sludge in the gallbladder which is mildly distended. There is slight indistinctness of the pericholecystic fat, which can be seen with acute cholecystitis in the appropriate clinical setting. Recommend right upper quadrant ultrasound for further evaluation. 3. Bilateral nonobstructing renal calculi. 4. Mild wall thickening of the distal esophagus, can be seen with reflux or esophagitis. 5. Urinary bladder wall thickening at the dome, recommend correlation with urinalysis to exclude urinary tract infection. Aortic Atherosclerosis (ICD10-I70.0). Electronically Signed   By: Keith Rake M.D.   On: 02/17/2021 21:51   NM Hepato W/EF  Result Date: 02/19/2021 CLINICAL DATA:  Right  upper quadrant pain. Assess for cholecystitis. EXAM: NUCLEAR MEDICINE HEPATOBILIARY IMAGING WITH GALLBLADDER EF TECHNIQUE: Sequential images of the abdomen were obtained out to 60 minutes following intravenous administration of radiopharmaceutical. After slow intravenous infusion of 1.77 micrograms Cholecystokinin, gallbladder ejection fraction was determined. RADIOPHARMACEUTICALS:  5.5 mCi Tc-59m  Choletec IV COMPARISON:  Abdominal sonogram 02/17/2021 FINDINGS: Prompt uptake and biliary excretion of activity by the liver is seen. Biliary activity passes into small bowel, consistent with patent common bile duct. After 90 minutes of imaging no gallbladder activity was visualized. The patient received 2.7 mg of morphine the IV infusion. Following the IV administration of morphine the gallbladder was visualized. Calculated gallbladder ejection fraction is 45.7%. (At 60 min, normal ejection fraction is greater than 40%.) IMPRESSION: 1. Patent cystic duct without evidence for acute cholecystitis. 2. Normal gallbladder ejection fraction at 45.7%. Electronically Signed   By: Kerby Moors M.D.   On: 02/19/2021 13:11   IR Perc Cholecystostomy  Result Date: 03/06/2021 INDICATION: 60 year old female presenting with acute cholecystitis. EXAM: Ultrasound and fluoroscopic guided percutaneous cholecystostomy tube placement. MEDICATIONS: The patient is receiving intravenous Zosyn as an inpatient with the last dose finishing  approximately 1 hour prior to this procedure. ANESTHESIA/SEDATION: Moderate (conscious) sedation was employed during this procedure. A total of Versed 2 mg and Fentanyl 100 mcg was administered intravenously. Moderate Sedation Time: 19 minutes. The patient's level of consciousness and vital signs were monitored continuously by radiology nursing throughout the procedure under my direct supervision. FLUOROSCOPY TIME:  Fluoroscopy Time: 1 minutes 0 seconds (6 mGy). COMPLICATIONS: None immediate. PROCEDURE:  Informed written consent was obtained from the patient after a thorough discussion of the procedural risks, benefits and alternatives. All questions were addressed. Maximal Sterile Barrier Technique was utilized including caps, mask, sterile gowns, sterile gloves, sterile drape, hand hygiene and skin antiseptic. A timeout was performed prior to the initiation of the procedure. The right upper quadrant was prepped and draped in standard fashion. Preprocedure ultrasound evaluation demonstrated gas and fluid containing irregularly-shaped collection in the gallbladder fossa. The procedure was planned. Subdermal Local anesthesia was provided with 1% lidocaine. Deeper local anesthetic was administered under ultrasound guidance to the level of the liver capsule. Small skin nick was made. Under direct ultrasound visualization, an 18 gauge trocar needle was directed into the fluid collection/gallbladder. The inner stylet was removed and purulent aspirate was visualized. Small volume of contrast was injected demonstrating likely intraluminal position within the gallbladder, difficult to discern given severe distension irregularity. An Amplatz wire was inserted under fluoroscopic guidance which coiled in the gallbladder fossa. Serial dilation was performed followed by placement of a 10.2 French pigtail cholecystostomy tube. There was immediate release of gas followed by aspiration of 1 L of purulent material from the gallbladder. The catheter was coiled and locked then placed to bulb suction. A single, 0 silk retention suture was placed at the skin entry site. A sterile bandage was applied. The patient tolerated procedure well was transferred back to the floor in stable condition. IMPRESSION: Technically successful percutaneous, transhepatic, 10.2 French cholecystostomy tube placement with copious gas and purulent fluid aspirate. PLAN: IR will arrange for cholecystostomy tube check and change in 8 weeks. Ruthann Cancer, MD  [facility] Electronically Signed   By: Ruthann Cancer M.D.   On: 03/06/2021 17:01   US Abdomen Limited RUQ (LIVER/GB)  Result Date: 02/17/2021 CLINICAL DATA:  Right upper quadrant pain and right flank pain. EXAM: ULTRASOUND ABDOMEN LIMITED RIGHT UPPER QUADRANT COMPARISON:  Abdomen and pelvis CT, dated February 17, 2021 FINDINGS: Gallbladder: Echogenic sludge and shadowing echogenic gallstones are seen within the distended gallbladder. The largest measures approximately 1.1 cm x 0.6 cm x 0.7 cm. A 0.5 cm x 0.5 cm x 0.6 cm gallstone is seen within the neck of the gallbladder. There is no evidence of gallbladder wall thickening (2.1 mm). No sonographic Murphy sign noted by sonographer. Common bile duct: Diameter: 6.6 mm Liver: No focal lesion identified. Within normal limits in parenchymal echogenicity. Portal vein is patent on color Doppler imaging with normal direction of blood flow towards the liver. Other: None. IMPRESSION: Cholelithiasis and gallbladder sludge, without evidence of acute cholecystitis. Further evaluation with a nuclear medicine hepatobiliary scan is recommended if this remains of clinical concern. Electronically Signed   By: Virgina Norfolk M.D.   On: 02/17/2021 22:58    Microbiology: Recent Results (from the past 240 hour(s))  Resp Panel by RT-PCR (Flu A&B, Covid) Nasopharyngeal Swab     Status: None   Collection Time: 03/06/21 10:37 AM   Specimen: Nasopharyngeal Swab; Nasopharyngeal(NP) swabs in vial transport medium  Result Value Ref Range Status  SARS Coronavirus 2 by RT PCR NEGATIVE NEGATIVE Final    Comment: (NOTE) SARS-CoV-2 target nucleic acids are NOT DETECTED.  The SARS-CoV-2 RNA is generally detectable in upper respiratory specimens during the acute phase of infection. The lowest concentration of SARS-CoV-2 viral copies this assay can detect is 138 copies/mL. A negative result does not preclude  SARS-Cov-2 infection and should not be used as the sole basis for treatment or other patient management decisions. A negative result may occur with  improper specimen collection/handling, submission of specimen other than nasopharyngeal swab, presence of viral mutation(s) within the areas targeted by this assay, and inadequate number of viral copies(<138 copies/mL). A negative result must be combined with clinical observations, patient history, and epidemiological information. The expected result is Negative.  Fact Sheet for Patients:  EntrepreneurPulse.com.au  Fact Sheet for Healthcare Providers:  IncredibleEmployment.be  This test is no t yet approved or cleared by the [place] FDA and  has been authorized for detection and/or diagnosis of SARS-CoV-2 by FDA under an Emergency Use Authorization (EUA). This EUA will remain  in effect (meaning this test can be used) for the duration of the COVID-19 declaration under Section 564(b)(1) of the Act, 21 U.S.C.section 360bbb-3(b)(1), unless the authorization is terminated  or revoked sooner.       Influenza A by PCR NEGATIVE NEGATIVE Final   Influenza B by PCR NEGATIVE NEGATIVE Final    Comment: (NOTE) The Xpert Xpress SARS-CoV-2/FLU/RSV plus assay is intended as an aid in the diagnosis of influenza from Nasopharyngeal swab specimens and should not be used as a sole basis for treatment. Nasal washings and aspirates are unacceptable for Xpert Xpress SARS-CoV-2/FLU/RSV testing.  Fact Sheet for Patients: EntrepreneurPulse.com.au  Fact Sheet for Healthcare Providers: IncredibleEmployment.be  This test is not yet approved or cleared by the [place] FDA and has been authorized for detection and/or diagnosis of SARS-CoV-2 by FDA under an Emergency Use Authorization (EUA). This EUA will remain in effect (meaning this test can be used) for the duration of  the COVID-19 declaration under Section 564(b)(1) of the Act, 21 U.S.C. section 360bbb-3(b)(1), unless the authorization is terminated or revoked.  Performed at [facility], [place] [address], [place], [place] [address]   Aerobic/Anaerobic Culture w Gram Stain (surgical/deep wound)     Status: None   Collection Time: 03/06/21  4:45 PM   Specimen: Abscess  Result Value Ref Range Status   Specimen Description   Final    ABSCESS GALL BLADDER Performed at [facility] [address], [place], [place] [address]    Special Requests   Final    NONE Performed at [facility], [place] [address] [place], [place] [address]    Gram Stain   Final    FEW WBC PRESENT, PREDOMINANTLY MONONUCLEAR FEW GRAM NEGATIVE RODS    Culture   Final    ABUNDANT ESCHERICHIA COLI ABUNDANT FUSOBACTERIUM MORTIFERUM BETA LACTAMASE NEGATIVE Performed at [facility], [place] [address] [place], [place] [address]    Report Status 03/09/2021 FINAL  Final   Organism ID, Bacteria ESCHERICHIA COLI  Final      Susceptibility   Escherichia coli - MIC*    AMPICILLIN 8 SENSITIVE Sensitive     CEFAZOLIN <=4 SENSITIVE Sensitive     CEFEPIME <=0.12 SENSITIVE Sensitive     CEFTAZIDIME <=1 SENSITIVE Sensitive     CEFTRIAXONE <=0.25 SENSITIVE Sensitive     CIPROFLOXACIN <=0.25 SENSITIVE Sensitive  GENTAMICIN <=1 SENSITIVE Sensitive     IMIPENEM <=0.25 SENSITIVE Sensitive     TRIMETH/SULFA <=20 SENSITIVE Sensitive     AMPICILLIN/SULBACTAM 4 SENSITIVE Sensitive     PIP/TAZO <=4 SENSITIVE Sensitive     * ABUNDANT ESCHERICHIA COLI  Culture, blood (routine x 2)     Status: None (Preliminary result)   Collection Time: 03/08/21  5:03 AM   Specimen: BLOOD  Result Value Ref Range Status   Specimen Description   Final    BLOOD BLOOD RIGHT WRIST Performed at [facility] [address] [place], [place] [address]    Special Requests   Final     BOTTLES DRAWN AEROBIC ONLY Blood Culture adequate volume Performed at [facility] [address], [place], [place] [address]    Culture   Final    NO GROWTH 1 DAY Performed at [facility], [place] [address], [place], [place] [address]    Report Status PENDING  Incomplete  Culture, blood (routine x 2)     Status: None (Preliminary result)   Collection Time: 03/08/21  5:03 AM   Specimen: BLOOD  Result Value Ref Range Status   Specimen Description   Final    BLOOD BLOOD LEFT HAND Performed at [facility] [address] [place], [place] [address]    Special Requests   Final    BOTTLES DRAWN AEROBIC ONLY Blood Culture results may not be optimal due to an excessive volume of blood received in culture bottles Performed at [facility] [address], [place], [place] [address]    Culture   Final    NO GROWTH 1 DAY Performed at [facility], [place] [address], [place], [place] [address]    Report Status PENDING  Incomplete     Labs: Basic Metabolic Panel: Recent Labs  Lab 03/05/21 1426 03/06/21 1037 03/07/21 0505 03/08/21 0503 03/09/21 0446  NA 136 131* 136 138 141  K 3.2* 3.0* 3.9 2.9* 3.4*  CL 92* 90* 94* 102 107  CO2 34* 29 30 25 24   GLUCOSE 157* 155* 172* 110* 91  BUN 13 8 12 16 15   CREATININE 0.40 0.35* 0.58 0.55 0.46  CALCIUM 8.9 8.3* 8.4* 8.2* 8.1*  MG  --  1.8  --   --  2.1  PHOS  --  3.6  --   --   --    Liver Function Tests: Recent Labs  Lab 03/05/21 1426 03/06/21 1037 03/07/21 0505  AST 31 27 38  ALT 27 27 25   ALKPHOS 150* 143* 145*  BILITOT 0.7 0.9 0.7  PROT 6.8 6.4* 5.8*  ALBUMIN 3.1* 2.3* 2.0*   Recent Labs  Lab 03/05/21 1426 03/06/21 1037  LIPASE 69.0* 23   No results for input(s): AMMONIA in the last 168 hours. CBC: Recent Labs  Lab 03/05/21 1426 03/06/21 1037 03/07/21 0505 03/08/21 0503 03/09/21 0446  WBC 14.4* 14.7* 26.0* 12.2* 7.0  NEUTROABS  --  11.3*  --  9.3*  3.9  HGB 12.1 11.0* 10.8* 10.2* 10.1*  HCT 36.5 33.1* 32.2* 31.8* 31.6*  MCV 91.6 91.7 93.9 95.5 95.2  PLT 701.0* 634* 532* 509* 503*   Cardiac Enzymes: No results for input(s): CKTOTAL, CKMB, CKMBINDEX, TROPONINI in the last 168 hours. BNP: BNP (last 3 results) No results for input(s): BNP in the last 8760 hours.  ProBNP (last 3 results) No results for input(s): PROBNP in the last 8760 hours.  CBG: Recent  Labs  Lab 03/08/21 1108 03/08/21 1647 03/08/21 2141 03/09/21 0757 03/09/21 1128  GLUCAP 107* 103* 104* 88 103*       Signed:  Alma Friendly, MD [facility] 03/09/2021, 3:18 PM

## 2021-03-12 ENCOUNTER — Other Ambulatory Visit (HOSPITAL_COMMUNITY): Payer: Self-pay

## 2021-03-12 ENCOUNTER — Other Ambulatory Visit: Payer: Self-pay | Admitting: *Deleted

## 2021-03-12 ENCOUNTER — Telehealth: Payer: Self-pay

## 2021-03-12 NOTE — Patient Outreach (Signed)
[facility]) Care Management  03/12/2021  Sandra [name] [birth year] [id number]   Transition of care telephone call  Referral received:03/08/21 Initial outreach:03/12/21 Insurance: [facility]   Initial unsuccessful telephone call to patient's preferred number in order to complete transition of care assessment; no answer, left HIPAA compliant voicemail message requesting return call.   Objective: Per the electronic medical record, Sandra [name] was hospitalized at [facility] 11/9-11/11/22 for Intraabominal abscess, Comorbidities include: anxiety depression, type 2 Diabetes diet controlled, HLD, HTN, gastric bypass surgery. Recent Hospital admission 10/22-/02/20/21 for RLQ abdominal pain, marginal ulcer.  She was discharged to home on 03/09/21  without the need for home health services or DME.   Plan: This RNCM will route unsuccessful outreach letter with [place] Management pamphlet and 24 hour Nurse Advice Line Magnet to [place] Management clinical pool to be mailed to patient's home address. This RNCM will attempt another outreach within 4 business days.   Joylene Draft, RN, BSN  [place] Management Coordinator  [phone]- Mobile [phone]- Toll Free Main Office

## 2021-03-12 NOTE — Telephone Encounter (Signed)
Transition Care Management Follow-up Telephone Call Date of discharge and from where: 03/09/2021 from [facility] Diagnosis: Intra-abdominal abscess  How have you been since you were released from the hospital? "Still a little tired due to malnourishment; I'm drinking 2 Premier Protein shakes a day" Any questions or concerns? No  Items Reviewed: Did the pt receive and understand the discharge instructions provided? Yes  Medications obtained and verified? Yes  Other? Yes  Any new allergies since your discharge? No  Dietary orders reviewed? Yes; Bariatric Diet Do you have support at home? Yes , daughter and granddaughter  Home Care and Equipment/Supplies: Were home health services ordered? no If so, what is the name of the agency? no  Has the agency set up a time to come to the patient's home? no Were any new equipment or medical supplies ordered?  No What is the name of the medical supply agency? no Were you able to get the supplies/equipment? not applicable Do you have any questions related to the use of the equipment or supplies? No  Functional Questionnaire: (I = Independent and D = Dependent) ADLs: I   Bathing/Dressing- i  Meal Prep- i  Eating- i  Maintaining continence- i  Transferring/Ambulation- i  Managing Meds- i  Follow up appointments reviewed:  PCP Hospital f/u appt confirmed? Yes  Scheduled to see Billey Gosling, MD on 03/19/2021 @ 10:40 am. [facility] f/u appt confirmed? Yes  Scheduled to see Michael Boston, MD on 03/26/2021 @ 3:00 pm. Are transportation arrangements needed? No  If their condition worsens, is the pt aware to call PCP or go to the Emergency Dept.? Yes Was the patient provided with contact information for the PCP's office or ED? Yes Was to pt encouraged to call back with questions or concerns? Yes

## 2021-03-13 LAB — CULTURE, BLOOD (ROUTINE X 2)
Culture: NO GROWTH
Culture: NO GROWTH
Special Requests: ADEQUATE

## 2021-03-14 ENCOUNTER — Other Ambulatory Visit: Payer: Self-pay

## 2021-03-14 DIAGNOSIS — E119 Type 2 diabetes mellitus without complications: Secondary | ICD-10-CM

## 2021-03-18 ENCOUNTER — Encounter: Payer: Self-pay | Admitting: Internal Medicine

## 2021-03-18 NOTE — Progress Notes (Signed)
Subjective:    Patient ID: Sandra [name], female    DOB: 09-May-1960, 60 y.o.   MRN: [id number]  This visit occurred during the SARS-CoV-2 public health emergency.  Safety protocols were in place, including screening questions prior to the visit, additional usage of staff PPE, and extensive cleaning of exam room while observing appropriate contact time as indicated for disinfecting solutions.     HPI The patient is here for follow up from the hospital.  Admitted 10/22 - 10/25 for severe abdominal pain. CT scan showed layering stones/sludge in GB which was mildly distended. Possible acute cholecystitis.    HIDA scan was neg for acute cholecystitis.  EGD - 2 marginal ulcers, started on PPI, carafate.  Pain improved - discharged home.   Admitted 11/8-11/11 - for RUQ abscess.  She continued to have pain after above hospitalization.  Dr Carlean Purl ordered a CT which showed a RUQ abscess.  In ED she had leukocytosis, hypokalemia.  Surgery and IR were consulted.  Admitted for perforated cholecystitis w RUQ intra-abdominal abscess.  IR placed drain 11/8.  BC neg.  Abscess showed E coli.  She was initially on IV Zosyn which was change to PO Augmentin x total 10 days.  To FU with surgery and IR  drain likely to be removed after 6 weeks.   Hypokalemia - replaced DM - a1c in 10/22 6.4%, diet controlled CAD - stable Normocytic anemia/thrombocytosis - will follow   She sees Dr Johney Maine next Monday and then IR in January.  Was told she would need the drain in for 6 weeks unless she has surgery prior to that.  Today is her last day of abx.  She did have nausea couple of times with the antibiotic.  She is drinking two premier protein/ day.  She is still fatigued, but her energy level has improved.  She denies any abdominal pain.   Medications and allergies reviewed with patient and updated if appropriate.  Patient Active Problem List   Diagnosis Date Noted   Intra-abdominal abscess ([place])  03/06/2021   Normocytic anemia 03/06/2021   Hypokalemia 03/06/2021   Hyponatremia 03/06/2021   Thrombocytosis 03/06/2021   Moderate protein-calorie malnutrition ([place]) 03/06/2021   Abdominal pain 02/17/2021   Right carpal tunnel syndrome 11/10/2020   Labral tear of right hip joint 11/10/2020   Chronic right hip pain 04/10/2020   Nonallopathic lesion of sacral region 08/05/2019   Greater trochanteric bursitis, right 08/05/2019   Benign neoplasm of descending colon    Low back pain 05/27/2019   Hematoma of right hip 05/07/2019   Trigger finger, right index finger 02/17/2019   Anxiety 11/12/2018   Left lateral epicondylitis 05/14/2018   Left elbow pain 05/04/2018   Right rotator cuff tear 08/20/2017   Cervical disc disorder with radiculopathy of cervical region 04/25/2016   Nonallopathic lesion of cervical region 04/17/2016   Nonallopathic lesion of thoracic region 04/17/2016   Nonallopathic lesion of rib cage 04/17/2016   Nonallopathic lesion of lumbosacral region 04/17/2016   Degenerative cervical disc 03/29/2016   Degenerative arthritis of right knee 03/29/2016   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) 08/15/2011   Hypercholesterolemia 08/15/2011   History  of colonic polyps 12/29/2009    Current Outpatient Medications on File Prior to Visit  Medication Sig Dispense Refill   amoxicillin-clavulanate (AUGMENTIN) 875-125 MG tablet Take 1 tablet by mouth 2 (two) times daily for 10 days. 20 tablet 0   Ensure Max Protein (ENSURE MAX PROTEIN) LIQD Take 330 mLs (11 oz total) by mouth 2 (two) times daily. 19800 mL 0   gabapentin (NEURONTIN) 300 MG capsule Take 1  capsule (300 mg total) by mouth at bedtime.     glucose blood (FREESTYLE LITE) test strip Use daily as instructed. 100 each 12   Multiple Vitamins-Minerals (MULTIVITAMIN PO) Take 2 tablets by mouth daily.      omeprazole (PRILOSEC) 40 MG capsule Take 1 capsule by mouth 2 times daily before a meal. Open capsule and sprinkle in water, other liquid or applesauce 180 capsule 1   ondansetron (ZOFRAN-ODT) 4 MG disintegrating tablet DISSOLVE 1 TABLET BY MOUTH EVERY 8 HOURS AS NEEDED FOR NAUSEA OR VOMITING (MAY TAKE 2 TABLETS IF 1 TABLET IS INEFFECTIVE) (Patient taking differently: Take 4-8 mg by mouth every 8 (eight) hours as needed for nausea or vomiting.) 30 tablet 3   polyethylene glycol powder (MIRALAX) 17 GM/SCOOP powder Take 17 g by mouth daily. 238 g 0   No current facility-administered medications on file prior to visit.    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 HYSTERECTOMY   STAGE I ENDOMETROID CARCINOMA   Hx of adenomatous colonic polyps 2011   BENIGN   Hyperlipidemia    no meds now   Hypertension    Sleep apnea    no CPAP since surgery    Past Surgical History:  Procedure Laterality Date   ABDOMINAL HYSTERECTOMY  12-20-2004   TAH   BILATERAL BREAST REDUCTION  11-16-1999   BIOPSY  02/20/2021   Procedure: BIOPSY;   Surgeon: Daryel November, MD;  Location: [facility] ENDOSCOPY;  Service: Gastroenterology;;   Rosanne Gutting PYLORI N/A 05/27/2013   Procedure: Lauris Chroman;  Surgeon: Shann Medal, MD;  Location: [facility];  Service: General;  Laterality: N/A;   [place] N/A 04/25/2014   Procedure: COLONOSCOPY;  Surgeon: Gatha Mayer, MD;  Location: [facility] ENDOSCOPY;  Service: Endoscopy;  Laterality: N/A;   COLONOSCOPY WITH PROPOFOL N/A 07/26/2019   Procedure: COLONOSCOPY WITH PROPOFOL;  Surgeon: Gatha Mayer, MD;  Location: [facility] ENDOSCOPY;  Service: Endoscopy;  Laterality: N/A;   CYSTOSCOPY  09/06/2011   Procedure: CYSTOSCOPY;  Surgeon: Ailene Rud, MD;  Location: [facility];  Service: Urology;  Laterality: N/A;  LYNX SLING    ESOPHAGOGASTRODUODENOSCOPY (  EGD) WITH PROPOFOL N/A 09/27/2014   Procedure: ESOPHAGOGASTRODUODENOSCOPY (EGD) WITH PROPOFOL;  Surgeon: Gatha Mayer, MD;  Location: [facility] ENDOSCOPY;  Service: Endoscopy;  Laterality: N/A;   ESOPHAGOGASTRODUODENOSCOPY (EGD) WITH PROPOFOL N/A 03/11/2016   Procedure: ESOPHAGOGASTRODUODENOSCOPY (EGD) WITH PROPOFOL;  Surgeon: Jerene Bears, MD;  Location: [facility] ENDOSCOPY;  Service: Endoscopy;  Laterality: N/A;   ESOPHAGOGASTRODUODENOSCOPY (EGD) WITH PROPOFOL Left 02/20/2021   Procedure: ESOPHAGOGASTRODUODENOSCOPY (EGD) WITH PROPOFOL;  Surgeon: Daryel November, MD;  Location: [place];  Service: Gastroenterology;  Laterality: Left;   GASTRIC BYPASS  10/04/2013   GASTRIC ROUX-EN-Y N/A 10/04/2013   Procedure: LAPAROSCOPIC ROUX-EN-Y GASTRIC BYPASS WITH UPPER ENDOSCOPY;  Surgeon: Shann Medal, MD;  Location: [facility] ORS;  Service: General;  Laterality: N/A;   HEMORRHOID BANDING  02/2013   IR PERC CHOLECYSTOSTOMY  03/06/2021   POLYPECTOMY     POLYPECTOMY  07/26/2019   Procedure: POLYPECTOMY;  Surgeon: Gatha Mayer, MD;  Location: [facility] ENDOSCOPY;  Service: Endoscopy;;   PUBOVAGINAL SLING  09/06/2011    Procedure: Gaynelle Arabian;  Surgeon: Ailene Rud, MD;  Location: [facility];  Service: Urology;  Laterality: N/A;   REDUCTION MAMMAPLASTY Bilateral 2001   SHOULDER ARTHROSCOPY Right 10/23/2017   Procedure: RIGHT SHOULDER ARTHROSCOPIC ROTATOR CUFF REPAIR VS. DEBRIDEMENT, SUBACROMIAL DECOMPARESSION, DISTAL CLAVICAL EXCISION;  Surgeon: Tania Ade, MD;  Location: [place];  Service: Orthopedics;  Laterality: Right;   UVULOPALATOPHARYNGOPLASTY, TONSILLECTOMY AND SEPTOPLASTY  1994    Social History   Socioeconomic History   Marital status: Single    Spouse name: Not on file   Number of children: Not on file   Years of education: Not on file   Highest education level: Not on file  Occupational History   Occupation:  endoscopy endo tech  Tobacco Use   Smoking status: Never   Smokeless tobacco: Never  Vaping Use   Vaping Use: Never used  Substance and Sexual Activity   Alcohol use: No    Alcohol/week: 0.0 standard drinks   Drug use: No   Sexual activity: Not Currently    Birth control/protection: Surgical    Comment: HYST.. 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[employer]   Social Determinants of Health   Financial Resource Strain: Not on file  Food Insecurity: Not on file  Transportation Needs: Not on file  Physical Activity: Not on file  Stress: Not on file  Social Connections: Not on file    Family History  Adopted: Yes    Review of Systems  Constitutional:  Negative for chills and fever.  Respiratory:  Negative for cough, shortness of breath and wheezing.   Cardiovascular:  Negative for chest pain, palpitations and leg swelling.  Gastrointestinal:  Positive for constipation (miralax) and nausea (from abx a couple of times). Negative for  abdominal pain, blood in stool and diarrhea.       No gerd - controlled  Neurological:  Positive for light-headedness (occ). Negative for headaches.      Objective:   Vitals:   03/19/21 1049  BP: 110/64  Pulse: 79  Temp: 98.3 F (36.8 C)  SpO2: 97%   BP Readings from Last 3 Encounters:  03/19/21 110/64  03/09/21 108/71  02/20/21 112/66   Wt Readings from Last 3 Encounters:  03/19/21 137  lb 3.2 oz (62.2 kg)  03/09/21 147 lb 0.8 oz (66.7 kg)  02/17/21 147 lb (66.7 kg)   Body mass index is 25.09 kg/m.   Physical Exam    Constitutional: Appears well-developed and well-nourished. No distress.  HENT:  Head: Normocephalic and atraumatic.  Neck: Neck supple. No tracheal deviation present. No thyromegaly present.  No cervical lymphadenopathy Cardiovascular: Normal rate, regular rhythm and normal heart sounds.   No murmur heard. No carotid bruit .  No edema Pulmonary/Chest: Effort normal and breath sounds normal. No respiratory distress. No has no wheezes. No rales. Abdomen: Soft, nondistended, nontender, insertion site of drain bandaged so I did not look at it, but she did say there was no redness or evidence of infection-bandage to be changed later today. Skin: Skin is warm and dry. Not diaphoretic.  Psychiatric: Normal mood and affect. Behavior is normal.   Lab Results  Component Value Date   WBC 7.0 03/09/2021   HGB 10.1 (L) 03/09/2021   HCT 31.6 (L) 03/09/2021   PLT 503 (H) 03/09/2021   GLUCOSE 91 03/09/2021   CHOL 157 10/21/2019   TRIG 166.0 (H) 10/21/2019   HDL 38.70 (L) 10/21/2019   LDLDIRECT 95.0 11/13/2018   LDLCALC 85 10/21/2019   ALT 25 03/07/2021   AST 38 03/07/2021   NA 141 03/09/2021   K 3.4 (L) 03/09/2021   CL 107 03/09/2021   CREATININE 0.46 03/09/2021   BUN 15 03/09/2021   CO2 24 03/09/2021   TSH 1.18 10/22/2016   INR 1.2 03/06/2021   HGBA1C 6.4 (H) 02/20/2021   MICROALBUR 1.6 10/21/2019    IR Perc Cholecystostomy INDICATION: 60 year old  female presenting with acute cholecystitis.  EXAM: Ultrasound and fluoroscopic guided percutaneous cholecystostomy tube placement.  MEDICATIONS: The patient is receiving intravenous Zosyn as an inpatient with the last dose finishing approximately 1 hour prior to this procedure.  ANESTHESIA/SEDATION: Moderate (conscious) sedation was employed during this procedure. A total of Versed 2 mg and Fentanyl 100 mcg was administered intravenously.  Moderate Sedation Time: 19 minutes. The patient's level of consciousness and vital signs were monitored continuously by radiology nursing throughout the procedure under my direct supervision.  FLUOROSCOPY TIME:  Fluoroscopy Time: 1 minutes 0 seconds (6 mGy).  COMPLICATIONS: None immediate.  PROCEDURE: Informed written consent was obtained from the patient after a thorough discussion of the procedural risks, benefits and alternatives. All questions were addressed. Maximal Sterile Barrier Technique was utilized including caps, mask, sterile gowns, sterile gloves, sterile drape, hand hygiene and skin antiseptic. A timeout was performed prior to the initiation of the procedure.  The right upper quadrant was prepped and draped in standard fashion. Preprocedure ultrasound evaluation demonstrated gas and fluid containing irregularly-shaped collection in the gallbladder fossa. The procedure was planned. Subdermal Local anesthesia was provided with 1% lidocaine. Deeper local anesthetic was administered under ultrasound guidance to the level of the liver capsule. Small skin nick was made. Under direct ultrasound visualization, an 18 gauge trocar needle was directed into the fluid collection/gallbladder. The inner stylet was removed and purulent aspirate was visualized. Small volume of contrast was injected demonstrating likely intraluminal position within the gallbladder, difficult to discern given severe distension irregularity. An Amplatz wire  was inserted under fluoroscopic guidance which coiled in the gallbladder fossa. Serial dilation was performed followed by placement of a 10.2 French pigtail cholecystostomy tube. There was immediate release of gas followed by aspiration of 1 L of purulent material from the gallbladder. The catheter was coiled and locked  then placed to bulb suction. A single, 0 silk retention suture was placed at the skin entry site. A sterile bandage was applied. The patient tolerated procedure well was transferred back to the floor in stable condition.  IMPRESSION: Technically successful percutaneous, transhepatic, 10.2 French cholecystostomy tube placement with copious gas and purulent fluid aspirate.  PLAN: IR will arrange for cholecystostomy tube check and change in 8 weeks.  Ruthann Cancer, MD  [facility]  [facility]  Electronically Signed   By: Ruthann Cancer M.D.   On: 03/06/2021 17:01 CT Abdomen Pelvis W Contrast CLINICAL DATA:  Abdominal pain. Remote history of gastric bypass surgery. Recent endoscopy and biopsy.  EXAM: CT ABDOMEN AND PELVIS WITH CONTRAST  TECHNIQUE: Multidetector CT imaging of the abdomen and pelvis was performed using the standard protocol following bolus administration of intravenous contrast.  CONTRAST:  51mL OMNIPAQUE IOHEXOL 350 MG/ML SOLN  COMPARISON:  Recent CT scan 02/17/2021  FINDINGS: Lower chest: There are very small pleural effusions and bibasilar atelectasis. The heart is normal in size. Age advanced LAD calcifications are noted.  Hepatobiliary: No hepatic lesions or intrahepatic biliary dilatation. I am unable to identify the gallbladder. It was present on the prior CT scan and there were several small layering gallstones noted. There is a large complex right upper quadrant abscess. Perforated cholecystitis with abscess is certainly a consideration. Normal caliber and course of the common bile  duct.  Pancreas: Mild mass effect on the pancreatic head by the large right upper quadrant abscess no mass, inflammation or ductal dilatation.  Spleen: Normal size. Stable splenic cysts.  Adrenals/Urinary Tract: The adrenal glands and kidneys are unremarkable and stable. Small renal calculi again noted. The bladder is unremarkable.  Stomach/Bowel: Surgical changes from gastric bypass surgery. I do not see any obvious complicating features associated with the bypass. Contrast gets all the way to the colon without evidence of obstruction. There is moderate inflammation involving the hepatic flexure region of the colon but this is due to the surrounding abscess.  Vascular/Lymphatic: The aorta and branch vessels are patent. Stable atherosclerotic calcifications. Borderline enlarged mesenteric and retroperitoneal lymph nodes are likely reactive.  Reproductive: Surgically absent.  Other: There is a large complex multifaceted right upper quadrant abscess measuring a maximum of 20 cm. Given its location and appearance I think it is most likely walled-off perforated cholecystitis.  Musculoskeletal: No significant bony findings. Moderate degenerative changes involving the spine.  IMPRESSION: 1. 20 cm complex right upper quadrant abscess. I think it is most likely walled-off perforated cholecystitis. 2. Surgical changes from gastric bypass surgery. No obvious complicating features associated with the bypass surgery. 3. Small pleural effusions and bibasilar atelectasis. 4. Age advanced LAD calcifications.  These results were called by telephone at the time of interpretation on 03/06/2021 at 9:09 am to provider Silvano Rusk , who verbally acknowledged these results.  Electronically Signed   By: Marijo Sanes M.D.   On: 03/06/2021 09:12    Assessment & Plan:    I spent 20 minutes dedicated to the care of this patient on the date of this encounter including review of recent labs,  imaging and procedures, speciality notes, obtaining history, communicating with the patient and documenting clinical information in the EHR   See Problem List for Assessment and Plan of chronic medical problems.

## 2021-03-19 ENCOUNTER — Ambulatory Visit: Payer: 59 | Admitting: Internal Medicine

## 2021-03-19 ENCOUNTER — Other Ambulatory Visit: Payer: Self-pay

## 2021-03-19 VITALS — BP 110/64 | HR 79 | Temp 98.3°F | Ht 62.0 in | Wt 137.2 lb

## 2021-03-19 DIAGNOSIS — E119 Type 2 diabetes mellitus without complications: Secondary | ICD-10-CM

## 2021-03-19 DIAGNOSIS — K289 Gastrojejunal ulcer, unspecified as acute or chronic, without hemorrhage or perforation: Secondary | ICD-10-CM | POA: Diagnosis not present

## 2021-03-19 DIAGNOSIS — K651 Peritoneal abscess: Secondary | ICD-10-CM | POA: Diagnosis not present

## 2021-03-19 DIAGNOSIS — K5909 Other constipation: Secondary | ICD-10-CM | POA: Diagnosis not present

## 2021-03-19 MED FILL — Ondansetron Orally Disintegrating Tab 4 MG: ORAL | 10 days supply | Qty: 30 | Fill #2 | Status: AC

## 2021-03-19 NOTE — Assessment & Plan Note (Signed)
Chronic Diet controlled Most recent A1c 6.4%-well-controlled Continue diabetic diet

## 2021-03-19 NOTE — Assessment & Plan Note (Signed)
Chronic Taking MiraLAX and constipation is well controlled

## 2021-03-19 NOTE — Assessment & Plan Note (Signed)
Chronic Recent history with 2 marginal ulcers Will need to remain on PPI lifelong Continue omeprazole 40 mg twice daily

## 2021-03-19 NOTE — Assessment & Plan Note (Signed)
Acute Related to perforated gallbladder Has a drain in place and has been draining a consistent amount of fluid although it has changed in color Abdomen without pain and nontender on exam Afebrile Abscess sounds like it is being treated successfully Complete antibiotic later today See surgery on Monday and hopefully will be a surgical candidate

## 2021-03-20 ENCOUNTER — Other Ambulatory Visit (HOSPITAL_COMMUNITY): Payer: Self-pay

## 2021-03-21 ENCOUNTER — Telehealth: Payer: Self-pay | Admitting: *Deleted

## 2021-03-21 NOTE — Chronic Care Management (AMB) (Signed)
  Care Management   Note  03/21/2021 Name: Sandra [name] MRN: [id number] DOB: 12-10-1960  Lindi Adie Witherow is a 60 y.o. year old female who is a primary care patient of Burns, Claudina Lick, MD. I reached out to Angelia Mould by phone today in response to a referral sent by Ms. [name]'s primary care provider.   Ms. Bear was given information about care management services today including:  Care management services include personalized support from designated clinical staff supervised by her physician, including individualized plan of care and coordination with other care providers 24/7 contact phone numbers for assistance for urgent and routine care needs. The patient may stop care management services at any time by phone call to the office staff.  Patient agreed to services and verbal consent obtained.   Follow up plan: Telephone appointment with care management team member scheduled for:03/30/21  [place]: [phone]

## 2021-03-26 ENCOUNTER — Ambulatory Visit: Payer: Self-pay | Admitting: Surgery

## 2021-03-26 DIAGNOSIS — K812 Acute cholecystitis with chronic cholecystitis: Secondary | ICD-10-CM | POA: Diagnosis not present

## 2021-03-26 DIAGNOSIS — K651 Peritoneal abscess: Secondary | ICD-10-CM | POA: Diagnosis not present

## 2021-03-26 DIAGNOSIS — K822 Perforation of gallbladder: Secondary | ICD-10-CM | POA: Diagnosis not present

## 2021-03-27 ENCOUNTER — Other Ambulatory Visit: Payer: Self-pay | Admitting: Surgery

## 2021-03-27 DIAGNOSIS — K651 Peritoneal abscess: Secondary | ICD-10-CM

## 2021-03-29 ENCOUNTER — Encounter: Payer: Self-pay | Admitting: Family Medicine

## 2021-03-29 ENCOUNTER — Other Ambulatory Visit: Payer: Self-pay | Admitting: Family Medicine

## 2021-03-29 ENCOUNTER — Other Ambulatory Visit (HOSPITAL_COMMUNITY): Payer: Self-pay

## 2021-03-30 ENCOUNTER — Other Ambulatory Visit (HOSPITAL_COMMUNITY): Payer: Self-pay

## 2021-03-30 ENCOUNTER — Ambulatory Visit: Payer: 59 | Admitting: *Deleted

## 2021-03-30 DIAGNOSIS — E7800 Pure hypercholesterolemia, unspecified: Secondary | ICD-10-CM

## 2021-03-30 DIAGNOSIS — K651 Peritoneal abscess: Secondary | ICD-10-CM

## 2021-03-30 DIAGNOSIS — E119 Type 2 diabetes mellitus without complications: Secondary | ICD-10-CM

## 2021-03-30 MED ORDER — GABAPENTIN 300 MG PO CAPS
300.0000 mg | ORAL_CAPSULE | Freq: Every day | ORAL | 2 refills | Status: DC
  Filled 2021-03-30: qty 90, 90d supply, fill #0

## 2021-03-30 NOTE — Chronic Care Management (AMB) (Signed)
Care Management    Sandra [name] Visit Note  03/30/2021 Name: Sandra [name] MRN: [id number] DOB: 18-Aug-1960  Subjective: Sandra [name] is a 60 y.o. year old female who is a primary care patient of [name], Sandra Lick, MD. The care management team was consulted for assistance with disease management and care coordination needs.    Engaged with patient by telephone for initial visit in response to provider referral for case management and/or care coordination services.   Consent to Services:   Sandra [name] was given information about Care Management services 03/21/21 including:  Care Management services includes personalized support from designated clinical staff supervised by her physician, including individualized plan of care and coordination with other care providers 24/7 contact phone numbers for assistance for urgent and routine care needs. The patient may stop case management services at any time by phone call to the office staff.  Patient agreed to services and consent obtained.   Assessment: Review of patient past medical history, allergies, medications, health status, including review of consultants reports, laboratory and other test data, was performed as part of comprehensive evaluation and provision of chronic care management services.   SDOH (Social Determinants of Health) assessments and interventions performed:  SDOH Interventions    Flowsheet Row Most Recent Value  SDOH Interventions   Food Insecurity Interventions Intervention Not Indicated  Housing Interventions Intervention Not Indicated  [lives in single family home, double-wide mobile home- 28 years, with 37 y/o granddaughter]  Transportation Interventions Intervention Not Indicated  [Patient normally drives self,  family assisting with transportation while patient post-op,  plans to resume driving once healed/ cleared to resume]      Care Plan Allergies  Allergen Reactions   Nsaids     r/t gastric surgery    Statins    Outpatient Encounter Medications as of 03/30/2021  Medication Sig   Ensure Max Protein (ENSURE MAX PROTEIN) LIQD Take 330 mLs (11 oz total) by mouth 2 (two) times daily.   gabapentin (NEURONTIN) 300 MG capsule Take 1 capsule (300 mg total) by mouth at bedtime.   Multiple Vitamins-Minerals (MULTIVITAMIN PO) Take 2 tablets by mouth daily.    omeprazole (PRILOSEC) 40 MG capsule Take 1 capsule by mouth 2 times daily before a meal. Open capsule and sprinkle in water, other liquid or applesauce   ondansetron (ZOFRAN-ODT) 4 MG disintegrating tablet DISSOLVE 1 TABLET BY MOUTH EVERY 8 HOURS AS NEEDED FOR NAUSEA OR VOMITING (MAY TAKE 2 TABLETS IF 1 TABLET IS INEFFECTIVE) (Patient taking differently: Take 4-8 mg by mouth every 8 (eight) hours as needed for nausea or vomiting.)   polyethylene glycol powder (MIRALAX) 17 GM/SCOOP powder Take 17 g by mouth daily.   glucose blood (FREESTYLE LITE) test strip Use daily as instructed.   [DISCONTINUED] gabapentin (NEURONTIN) 300 MG capsule Take 1 capsule (300 mg total) by mouth at bedtime.   No facility-administered encounter medications on file as of 03/30/2021.   Patient Active Problem List   Diagnosis Date Noted   Intra-abdominal abscess ([place]) 03/06/2021   Normocytic anemia 03/06/2021   Hypokalemia 03/06/2021   Hyponatremia 03/06/2021   Thrombocytosis 03/06/2021   Moderate protein-calorie malnutrition ([place]) 03/06/2021   Abdominal pain 02/17/2021   Right carpal tunnel syndrome 11/10/2020   Labral tear of right hip joint 11/10/2020   Chronic right hip pain 04/10/2020   Nonallopathic lesion of sacral region 08/05/2019   Greater trochanteric bursitis, right 08/05/2019   Benign neoplasm of descending colon    Low back pain 05/27/2019  Hematoma of right hip 05/07/2019   Trigger finger, right index finger 02/17/2019   Anxiety 11/12/2018   Left lateral epicondylitis 05/14/2018   Left elbow pain 05/04/2018   Right rotator cuff tear 08/20/2017    Cervical disc disorder with radiculopathy of cervical region 04/25/2016   Nonallopathic lesion of cervical region 04/17/2016   Nonallopathic lesion of thoracic region 04/17/2016   Nonallopathic lesion of rib cage 04/17/2016   Nonallopathic lesion of lumbosacral region 04/17/2016   Degenerative cervical disc 03/29/2016   Degenerative arthritis of right knee 03/29/2016   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   Adjustment disorder with mixed anxiety and depressed mood 07/14/2014   Personal history of colonic polyps    History of Roux-en-Y gastric bypass, 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   Chronic constipation 03/05/2013   Coronary atherosclerosis of native coronary artery 08/27/2011   History of uterine cancer 08/15/2011   Type 2 diabetes mellitus ([facility]) 08/15/2011   Hypercholesterolemia 08/15/2011   History of colonic polyps 12/29/2009   Conditions to be addressed/monitored: HLD and DMII, intra-abdominal abcsess  Care Plan : Sandra [name] Care Manager Plan of Care  Updates made by Sandra Royalty, Sandra [name] since 03/30/2021 12:00 AM     Problem: Chronic Disease Management Needs   Priority: High     Long-Range Goal: Development of plan of care for long term chronic disease management   Start Date: 03/30/2021  Expected End Date: 03/30/2022  Priority: High  Note:   Current Barriers:  Chronic Disease Management support and education needs related to HLD and DMII, recent surgery Recent hospitalization November 8-11, 2022 for intraabdominal abscess secondary to perforated gallbladder  RNCM Clinical Goal(s):  Patient will demonstrate ongoing health management independence as evidenced by adherence to established plan of care for DMII/ recent surgery        through collaboration with Sandra [name] Care manager, provider, and care team.   Interventions: 1:1 collaboration with primary care provider regarding development and update of comprehensive plan of care as evidenced by provider attestation and co-signature Inter-disciplinary care  team collaboration (see longitudinal plan of care) Evaluation of current treatment plan related to  self management and patient's adherence to plan as established by provider Reviewed recent hospitalization with patient; current clinical condition: reports doing overall well, denies clinical concerns today Medication review completed: patient denies concerns around medications, no discrepancies or issues identified as a result of review; patient self-manages medications and reports good general understanding of purpose, dosing, and scheduling of medications; independently self-manages medications Pain assessment completed: intermittent post-op pain, well controlled by tylenol; denies pain today Fall assessment completed: patient had fall at home after one of her recent hospitalizations, upon being discharged home; no injury, reported due to weakness around being discharged from hospital "too soon;" education provided re: fall prevention measures; patient currently "steady on feet;" currently does not use assistive devices  Diabetes:  (Status: New goal.) Long Term Goal   Lab Results  Component Value Date   HGBA1C 6.4 (H) 02/20/2021  Assessed patient's understanding of A1c goal: <6.5% Provided education to patient about basic DM disease process; Reviewed prescribed diet with patient low carbohydrate, low sugar, heart healthy, low cholesterol; Counseled on importance of regular laboratory monitoring as prescribed;        Discussed plans with patient for ongoing care management follow up and provided patient with direct contact information for care management team;      Review of patient status, including review of consultants reports, relevant laboratory and other test results, and medications completed;  Assessed social determinant of health barriers;        Confirmed patient monitors/ records blood sugars at home QD fasting: reviewed with patient today, she reports consistent values between  130-160 Assessed patient's understanding of meaning/ significance of A1-C values: good baseline understanding of same- Reviewed individual historical A1-C trends and provided education around correlation of A1-C value to blood sugar levels at home over 3 months  Confirmed patient adherent to prescribed diet: positive reinforcement provided with encouragement to continue efforts Patient reports need for new blood glucose meter- hers is 60 years old and her insurance provider has reached out to inform that she is ready for new Rx for glucometer; patient forgot to discuss this with Dr. Quay Burow during recent office visit 03/19/21-- I will reach out to Dr. Quay Burow to request; patient [facility]  Recent surgery for intra-abdominal abscess  (Status: New goal.) Long Term Goal  Evaluation of current treatment plan related to  surgical follow up plan ,  self-management and patient's adherence to plan as established by provider. Discussed plans with patient for ongoing care management follow up and provided patient with direct contact information for care management team Reviewed upcoming provider appointments: 04/10/21- sports medicine; 04/12/21- CT scan for follow up on status of abscess/ drain- confirmed patient aware of and has plans to attend as scheduled Confirmed patient self-managing gastric drain at home (JP); flushing to ensure patency Discussed current plan for surgical follow up: once patient gets next CT scan- surgeon will follow up to schedule plan for lap-cholecystectomy  Patient Goals/Self-Care Activities: As evidenced by review of EHR, collaboration with care team, and patient reporting during CCM Sandra [name] CM outreach, Patient Natalea will: Take medications as prescribed Attend all scheduled provider appointments Call pharmacy for medication refills Call provider office for new concerns or questions Continue to check fasting (first thing in the morning, before eating) blood  sugars at home Continue to follow heart healthy, low salt, low cholesterol, carbohydrate-modified, low sugar diet Review enclosed educational material       Plan:  Telephone follow up appointment with care management team member scheduled for: Tuesday, April 24, 2021 The patient has been provided with contact information for the care management team and has been advised to call with any health related questions or concerns   Oneta Rack, Sandra [name], Sandra [name], [place] [phone]: direct office [phone]: mobile

## 2021-03-30 NOTE — Patient Instructions (Addendum)
Visit Information   Sandra [name], Thank you for taking time to talk with me today. Please don't hesitate to contact me if I can be of assistance to you before our next scheduled telephone appointment.  Below are the goals we discussed today:   Sandra Self-Care Activities: Sandra Sandra [name] will: Take medications as prescribed Attend all scheduled provider appointments Call pharmacy for medication refills Call provider office for new concerns or questions Continue to check fasting (first thing in the morning, before eating) blood sugars at home Continue to follow heart healthy, low salt, low cholesterol, carbohydrate-modified, low sugar diet Review enclosed educational material    Our next appointment is by telephone on Tuesday, April 24, 2021 at 2:30 pm  Please call the care guide team at [phone] if you need to cancel or reschedule your appointment.   If you are experiencing a Mental Health or [place] or need someone to talk to, please  call the Suicide and Crisis Lifeline: 988 call the [place] National Suicide Prevention Lifeline: 4022610363 or TTY: 252-303-2646 TTY (220)003-8803) to talk to a trained counselor go to [facility] [phone]) call 911   Cholesterol Content in Foods Cholesterol is a waxy, fat-like substance that helps to carry fat in the blood. The body needs cholesterol in small amounts, but too much cholesterol can cause damage to the arteries and heart. What foods have cholesterol? Cholesterol is found in animal-based foods, such as meat, seafood, and dairy. Generally, low-fat dairy and lean meats have less cholesterol than full-fat dairy and fatty meats. The milligrams of cholesterol per serving (mg per serving) of common cholesterol-containing foods are listed below. Meats and other proteins Egg -- one large whole egg has 186 mg. Veal shank -- 4 oz (113 g) has 141 mg. Lean ground  [place] (93% lean) -- 4 oz (113 g) has 118 mg. Fat-trimmed lamb loin -- 4 oz (113 g) has 106 mg. Lean ground beef (90% lean) -- 4 oz (113 g) has 100 mg. Lobster -- 3.5 oz (99 g) has 90 mg. Pork loin chops -- 4 oz (113 g) has 86 mg. Canned salmon -- 3.5 oz (99 g) has 83 mg. Fat-trimmed beef top loin -- 4 oz (113 g) has 78 mg. Frankfurter -- 1 frank (3.5 oz or 99 g) has 77 mg. Crab -- 3.5 oz (99 g) has 71 mg. Roasted chicken without skin, white meat -- 4 oz (113 g) has 66 mg. Light bologna -- 2 oz (57 g) has 45 mg. Deli-cut [place] -- 2 oz (57 g) has 31 mg. Canned tuna -- 3.5 oz (99 g) has 31 mg. Sandra [name] -- 1 oz (28 g) has 29 mg. Oysters and mussels (raw) -- 3.5 oz (99 g) has 25 mg. Mackerel -- 1 oz (28 g) has 22 mg. Trout -- 1 oz (28 g) has 20 mg. Pork sausage -- 1 link (1 oz or 28 g) has 17 mg. Salmon -- 1 oz (28 g) has 16 mg. Tilapia -- 1 oz (28 g) has 14 mg. Dairy Soft-serve ice cream --  cup (4 oz or 86 g) has 103 mg. Whole-milk yogurt -- 1 cup (8 oz or 245 g) has 29 mg. Cheddar cheese -- 1 oz (28 g) has 28 mg. American cheese -- 1 oz (28 g) has 28 mg. Whole milk -- 1 cup (8 oz or 250 mL) has 23 mg. 2% milk -- 1 cup (8 oz or 250 mL) has 18 mg.  Cream cheese -- 1 tablespoon (Tbsp) (14.5 g) has 15 mg. Cottage cheese --  cup (4 oz or 113 g) has 14 mg. Low-fat (1%) milk -- 1 cup (8 oz or 250 mL) has 10 mg. Sour cream -- 1 Tbsp (12 g) has 8.5 mg. Low-fat yogurt -- 1 cup (8 oz or 245 g) has 8 mg. Nonfat Greek yogurt -- 1 cup (8 oz or 228 g) has 7 mg. Half-and-half cream -- 1 Tbsp (15 mL) has 5 mg. Fats and oils Cod liver oil -- 1 tablespoon (Tbsp) (13.6 g) has 82 mg. Butter -- 1 Tbsp (14 g) has 15 mg. Lard -- 1 Tbsp (12.8 g) has 14 mg. Bacon grease -- 1 Tbsp (12.9 g) has 14 mg. Mayonnaise -- 1 Tbsp (13.8 g) has 5-10 mg. Margarine -- 1 Tbsp (14 g) has 3-10 mg. The items listed above may not be a complete list of foods with cholesterol. Exact amounts of cholesterol in these foods may  vary depending on specific ingredients and brands. Contact a dietitian for more information. What foods do not have cholesterol? Most plant-based foods do not have cholesterol unless you combine them with a food that has cholesterol. Foods without cholesterol include: Grains and cereals. Vegetables. Fruits. Vegetable oils, such as olive, canola, and sunflower oil. Legumes, such as peas, beans, and lentils. Nuts and seeds. Egg whites. The items listed above may not be a complete list of foods that do not have cholesterol. Contact a dietitian for more information. Summary The body needs cholesterol in small amounts, but too much cholesterol can cause damage to the arteries and heart. Cholesterol is found in animal-based foods, such as meat, seafood, and dairy. Generally, low-fat dairy and lean meats have less cholesterol than full-fat dairy and fatty meats. This information is not intended to replace advice given to you by your health care provider. Make sure you discuss any questions you have with your health care provider. Document Revised: 08/25/2020 Document Reviewed: 08/25/2020 Elsevier Sandra Education  2022 [place] is a copy of your full care plan:  Care Plan : RN Care Manager Plan of Care  Updates made by Sandra Royalty, RN since 03/30/2021 12:00 AM     Problem: Chronic Disease Management Needs   Priority: High     Long-Range Goal: Development of plan of care for long term chronic disease management   Start Date: 03/30/2021  Expected End Date: 03/30/2022  Priority: High  Note:   Current Barriers:  Chronic Disease Management support and education needs related to HLD and DMII, recent surgery Recent hospitalization November 8-11, 2022 for intraabdominal abscess secondary to perforated gallbladder  RNCM Clinical Goal(s):  Sandra will demonstrate ongoing health management independence as evidenced by adherence to established plan of care for DMII/ recent  surgery        through collaboration with RN Care manager, provider, and care team.   Interventions: 1:1 collaboration with primary care provider regarding development and update of comprehensive plan of care as evidenced by provider attestation and co-signature Inter-disciplinary care team collaboration (see longitudinal plan of care) Evaluation of current treatment plan related to  self management and Sandra's adherence to plan as established by provider Reviewed recent hospitalization with Sandra; current clinical condition: reports doing overall well, denies clinical concerns today Medication review completed: Sandra denies concerns around medications, no discrepancies or issues identified as a result of review; Sandra self-manages medications and reports good general understanding of purpose, dosing, and scheduling of  medications; independently self-manages medications Pain assessment completed: intermittent post-op pain, well controlled by tylenol; denies pain today Fall assessment completed: Sandra had fall at home after one of her recent hospitalizations, upon being discharged home; no injury, reported due to weakness around being discharged from hospital "too soon;" education provided re: fall prevention measures; Sandra currently "steady on feet;" currently does not use assistive devices  Diabetes:  (Status: New goal.) Long Term Goal   Lab Results  Component Value Date   HGBA1C 6.4 (H) 02/20/2021  Assessed Sandra's understanding of A1c goal: <6.5% Provided education to Sandra about basic DM disease process; Reviewed prescribed diet with Sandra low carbohydrate, low sugar, heart healthy, low cholesterol; Counseled on importance of regular laboratory monitoring as prescribed;        Discussed plans with Sandra for ongoing care management follow up and provided Sandra with direct contact information for care management team;      Review of Sandra status, including review of  consultants reports, relevant laboratory and other test results, and medications completed;       Assessed social determinant of health barriers;        Confirmed Sandra monitors/ records blood sugars at home QD fasting: reviewed with Sandra today, she reports consistent values between 130-160 Assessed Sandra's understanding of meaning/ significance of A1-C values: good baseline understanding of same- Reviewed individual historical A1-C trends and provided education around correlation of A1-C value to blood sugar levels at home over 3 months  Confirmed Sandra adherent to prescribed diet: positive reinforcement provided with encouragement to continue efforts Sandra reports need for new blood glucose meter- hers is 60 years old and her insurance provider has reached out to inform that she is ready for new Rx for glucometer; Sandra forgot to discuss this with Dr. Quay Burow during recent office visit 03/19/21-- I will reach out to Dr. Quay Burow to request; Sandra uses [facility]  Recent surgery for intra-abdominal abscess  (Status: New goal.) Long Term Goal  Evaluation of current treatment plan related to  surgical follow up plan ,  self-management and Sandra's adherence to plan as established by provider. Discussed plans with Sandra for ongoing care management follow up and provided Sandra with direct contact information for care management team Reviewed upcoming provider appointments: 04/10/21- sports medicine; 04/12/21- CT scan for follow up on status of abscess/ drain- confirmed Sandra aware of and has plans to attend as scheduled Confirmed Sandra self-managing gastric drain at home (JP); flushing to ensure patency Discussed current plan for surgical follow up: once Sandra gets next CT scan- surgeon will follow up to schedule plan for lap-cholecystectomy  Sandra Goals/Self-Care Activities: As evidenced by review of EHR, collaboration with care team, and Sandra reporting  during CCM RN CM outreach, Sandra [name] will: Take medications as prescribed Attend all scheduled provider appointments Call pharmacy for medication refills Call provider office for new concerns or questions Continue to check fasting (first thing in the morning, before eating) blood sugars at home Continue to follow heart healthy, low salt, low cholesterol, carbohydrate-modified, low sugar diet Review enclosed educational material       Consent to CCM Services: Sandra [name] was given information about Chronic Care Management services 03/21/21 including:  CCM service includes personalized support from designated clinical staff supervised by her physician, including individualized plan of care and coordination with other care providers 24/7 contact phone numbers for assistance for urgent and routine care needs. Service will only be billed when office clinical staff spend 20  minutes or more in a month to coordinate care. Only one practitioner may furnish and bill the service in a calendar month. The Sandra may stop CCM services at any time (effective at the end of the month) by phone call to the office staff. The Sandra will be responsible for cost sharing (co-pay) of up to 20% of the service fee (after annual deductible is met).  Sandra agreed to services and verbal consent obtained.   Sandra verbalizes understanding of instructions provided today and agrees to view in MyChart Telephone follow up appointment with care management team member scheduled for: The Sandra has been provided with contact information for the care management team and has been advised to call with any health related questions or concerns

## 2021-04-01 ENCOUNTER — Other Ambulatory Visit: Payer: Self-pay | Admitting: Internal Medicine

## 2021-04-01 MED ORDER — FREESTYLE LITE W/DEVICE KIT
PACK | 0 refills | Status: DC
  Filled 2021-04-01: qty 1, 1d supply, fill #0

## 2021-04-02 ENCOUNTER — Other Ambulatory Visit (HOSPITAL_COMMUNITY): Payer: Self-pay

## 2021-04-02 MED ORDER — FREESTYLE LITE TEST VI STRP
ORAL_STRIP | 0 refills | Status: DC
  Filled 2021-04-02: qty 100, 25d supply, fill #0

## 2021-04-02 MED ORDER — NORMAL SALINE FLUSH 0.9 % IV SOLN
INTRAVENOUS | 1 refills | Status: DC
  Filled 2021-04-02: qty 300, 30d supply, fill #0
  Filled 2021-05-01: qty 300, 30d supply, fill #1

## 2021-04-02 MED ORDER — FREESTYLE LANCETS MISC
0 refills | Status: AC
  Filled 2021-04-02: qty 100, 25d supply, fill #0

## 2021-04-04 ENCOUNTER — Telehealth: Payer: Self-pay

## 2021-04-05 NOTE — Progress Notes (Signed)
[place] Phone: [phone] Subjective:   [name], am serving as a scribe for Dr. Hulan Saas. This visit occurred during the SARS-CoV-2 public health emergency.  Safety protocols were in place, including screening questions prior to the visit, additional usage of staff PPE, and extensive cleaning of exam room while observing appropriate contact time as indicated for disinfecting solutions.    I'm seeing this patient by the request  of:  Burns, Claudina Lick, MD  CC: Neck pain follow-up  WGN:[id number]  Sandra [name] is a 60 y.o. female coming in with complaint of back and neck pain. OMT 01/19/2021. Also f/u for injection, R index finger for triggering. Patient states that gallbladder ruptured and patient now has drain until January.  Patient was in the hospital.  Feels like pain in the hospital bed did cause more discomfort as well.  Medications patient has been prescribed: Gabapentin  Taking:       Reviewed prior external information including notes and imaging from previsou exam, outside providers and external EMR if available.   As well as notes that were available from care everywhere and other healthcare systems.  Past medical history, social, surgical and family history all reviewed in electronic medical record.  No pertanent information unless stated regarding to the chief complaint.   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 HYSTERECTOMY   STAGE I ENDOMETROID CARCINOMA   Hx of adenomatous colonic polyps 2011   BENIGN   Hyperlipidemia    no meds now   Hypertension    Sleep apnea    no CPAP since surgery    Allergies  Allergen Reactions   Nsaids     r/t gastric surgery   Statins      Review of Systems:  No headache, visual changes, nausea, vomiting, diarrhea, constipation, dizziness, abdominal pain, skin rash, fevers, chills, night sweats, weight loss, swollen lymph nodes, body aches, joint swelling, chest pain, shortness of breath, mood changes. POSITIVE muscle aches  Objective  Blood pressure 138/86, pulse 83, height 5\' 2"  (1.575 m), weight 138 lb (62.6 kg), SpO2 97 %.   General: No apparent distress alert and oriented x3 mood and affect normal, dressed appropriately.  HEENT: Pupils equal, extraocular movements intact  Respiratory: Patient's speak in full sentences and does not appear short of breath  Cardiovascular: No lower extremity edema, non tender, no erythema  Neuro: Cranial nerves II through XII are intact, neurovascularly intact in all extremities with 2+ DTRs and 2+ pulses.  Gait normal with good balance and coordination.  MSK:  Non tender with full range of  motion and good stability and symmetric strength and tone of shoulders, elbows, wrist, hip, knee and ankles bilaterally.  Back - Normal skin, Spine with normal alignment and no deformity.  No tenderness to vertebral process palpation.  Paraspinous muscles are not tender and without spasm.   Range of motion is full at neck and lumbar sacral regions  Osteopathic findings  C2 flexed rotated and side bent right C6 flexed rotated and side bent left        Assessment and Plan:  Degenerative cervical  disc Degenerative disc cervical.  No radicular symptoms.  Responded well to osteopathic manipulation.  With patient having the drain placed only focused on patient's cervical spine today.  No change in medications.  Patient will be having surgery at the beginning of February so we will see her before that and consider potentially further evaluation and osteopathic manipulation depending if the drain is removed.   Nonallopathic problems  Decision today to treat with OMT was based on Physical Exam  After verbal consent patient was treated with HVLA, ME, FPR techniques in cervical,  areas  Patient tolerated the procedure well with improvement in symptoms  Patient given exercises, stretches and lifestyle modifications  See medications in patient instructions if given  Patient will follow up in 4-8 weeks     The above documentation has been reviewed and is accurate and complete Sandra Pulley, DO        Note: This dictation was prepared with Dragon dictation along with smaller phrase technology. Any transcriptional errors that result from this process are unintentional.

## 2021-04-06 ENCOUNTER — Encounter: Payer: Self-pay | Admitting: Internal Medicine

## 2021-04-06 DIAGNOSIS — L0291 Cutaneous abscess, unspecified: Secondary | ICD-10-CM | POA: Insufficient documentation

## 2021-04-06 DIAGNOSIS — K8020 Calculus of gallbladder without cholecystitis without obstruction: Secondary | ICD-10-CM | POA: Insufficient documentation

## 2021-04-06 HISTORY — DX: Calculus of gallbladder without cholecystitis without obstruction: K80.20

## 2021-04-06 NOTE — Telephone Encounter (Signed)
FMLA form completed by Dr Carlean Purl and faxed to [phone], and scanned into epic. Copy mailed to patient.

## 2021-04-10 ENCOUNTER — Other Ambulatory Visit (HOSPITAL_COMMUNITY): Payer: Self-pay

## 2021-04-10 ENCOUNTER — Encounter: Payer: Self-pay | Admitting: Family Medicine

## 2021-04-10 ENCOUNTER — Ambulatory Visit: Payer: 59 | Admitting: Family Medicine

## 2021-04-10 ENCOUNTER — Other Ambulatory Visit: Payer: Self-pay

## 2021-04-10 VITALS — BP 138/86 | HR 83 | Ht 62.0 in | Wt 138.0 lb

## 2021-04-10 DIAGNOSIS — M9901 Segmental and somatic dysfunction of cervical region: Secondary | ICD-10-CM

## 2021-04-10 DIAGNOSIS — M5030 Other cervical disc degeneration, unspecified cervical region: Secondary | ICD-10-CM

## 2021-04-10 MED ORDER — GABAPENTIN 300 MG PO CAPS
300.0000 mg | ORAL_CAPSULE | Freq: Two times a day (BID) | ORAL | 0 refills | Status: DC
  Filled 2021-04-10: qty 180, 90d supply, fill #0

## 2021-04-10 NOTE — Assessment & Plan Note (Signed)
Degenerative disc cervical.  No radicular symptoms.  Responded well to osteopathic manipulation.  With patient having the drain placed only focused on patient's cervical spine today.  No change in medications.  Patient will be having surgery at the beginning of February so we will see her before that and consider potentially further evaluation and osteopathic manipulation depending if the drain is removed.

## 2021-04-10 NOTE — Patient Instructions (Addendum)
Great to see you Glad your doing better  See me in 6 weeks to consider manipulation before surgery

## 2021-04-12 ENCOUNTER — Ambulatory Visit
Admission: RE | Admit: 2021-04-12 | Discharge: 2021-04-12 | Disposition: A | Discharge status: Home / Self Care | Payer: 59 | Source: Ambulatory Visit | Attending: Surgery | Admitting: Surgery

## 2021-04-12 DIAGNOSIS — K819 Cholecystitis, unspecified: Secondary | ICD-10-CM | POA: Diagnosis not present

## 2021-04-12 DIAGNOSIS — K651 Peritoneal abscess: Secondary | ICD-10-CM

## 2021-04-12 DIAGNOSIS — L02211 Cutaneous abscess of abdominal wall: Secondary | ICD-10-CM | POA: Diagnosis not present

## 2021-04-12 DIAGNOSIS — N200 Calculus of kidney: Secondary | ICD-10-CM | POA: Diagnosis not present

## 2021-04-12 DIAGNOSIS — Z09 Encounter for follow-up examination after completed treatment for conditions other than malignant neoplasm: Secondary | ICD-10-CM | POA: Diagnosis not present

## 2021-04-12 DIAGNOSIS — D734 Cyst of spleen: Secondary | ICD-10-CM | POA: Diagnosis not present

## 2021-04-12 DIAGNOSIS — Z4682 Encounter for fitting and adjustment of non-vascular catheter: Secondary | ICD-10-CM | POA: Diagnosis not present

## 2021-04-12 HISTORY — PX: IR RADIOLOGIST EVAL & MGMT: IMG5224

## 2021-04-12 MED ORDER — IOPAMIDOL (ISOVUE-300) INJECTION 61%
100.0000 mL | Freq: Once | INTRAVENOUS | Status: AC | PRN
  Administered 2021-04-12: 100 mL via INTRAVENOUS

## 2021-04-12 NOTE — Progress Notes (Addendum)
Referring Physician(s): Gross,Steven  Chief Complaint: The patient is seen in follow up today s/p percutaneous cholecystostomy drain placed in IR 03/06/21  History of present illness:  Pt has done well OP of drain has diminished Still flushes daily - 10 cc sterile saline OP is maybe 2-5 cc daily Occasional pain at site Flushes easily--- denies pain No abd pain No N/V No fever chills  Finished antibiotics  Surgery lap chole planned for 05/31/21- Dr Johney Maine  Scheduled today for CT and drain injection   Past Medical History:  Diagnosis Date   Anxiety    Arthritis    CAD (coronary artery disease)    nonobstructive CAD in LAD per 2013 CTA coronary morph   Cholelithiasis 04/06/2021   Depression    Diabetes mellitus    no meds taken now, checks cbg once or twice a week    Difficult intubation    had to use the glidescope due to small airway    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 HYSTERECTOMY   STAGE I ENDOMETROID CARCINOMA   Hx of adenomatous colonic polyps 2011   BENIGN   Hyperlipidemia    no meds now   Hypertension    Sleep apnea    no CPAP since surgery    Past Surgical History:  Procedure Laterality Date   ABDOMINAL HYSTERECTOMY  12-20-2004   TAH   BILATERAL BREAST REDUCTION  11-16-1999   BIOPSY  02/20/2021   Procedure: BIOPSY;  Surgeon: Daryel November, MD;  Location: [facility] ENDOSCOPY;  Service: Gastroenterology;;   Rosanne Gutting PYLORI N/A 05/27/2013   Procedure: Lauris Chroman;  Surgeon: Shann Medal, MD;  Location: [facility];  Service: General;  Laterality: N/A;   [place] N/A 04/25/2014   Procedure: COLONOSCOPY;  Surgeon: Gatha Mayer, MD;  Location: [facility] ENDOSCOPY;  Service: Endoscopy;  Laterality: N/A;   COLONOSCOPY WITH PROPOFOL N/A 07/26/2019   Procedure: COLONOSCOPY WITH PROPOFOL;  Surgeon: Gatha Mayer, MD;  Location: [facility] ENDOSCOPY;  Service: Endoscopy;  Laterality: N/A;   CYSTOSCOPY  09/06/2011   Procedure: CYSTOSCOPY;  Surgeon: Ailene Rud, MD;  Location: [facility];  Service: Urology;  Laterality: N/A;  LYNX SLING    ESOPHAGOGASTRODUODENOSCOPY (EGD) WITH PROPOFOL N/A 09/27/2014   Procedure: ESOPHAGOGASTRODUODENOSCOPY (EGD) WITH PROPOFOL;  Surgeon: Gatha Mayer, MD;  Location: [facility] ENDOSCOPY;  Service: Endoscopy;  Laterality: N/A;   ESOPHAGOGASTRODUODENOSCOPY (EGD) WITH PROPOFOL N/A 03/11/2016   Procedure: ESOPHAGOGASTRODUODENOSCOPY (EGD) WITH PROPOFOL;  Surgeon: Jerene Bears, MD;  Location: [facility] ENDOSCOPY;  Service: Endoscopy;  Laterality: N/A;   ESOPHAGOGASTRODUODENOSCOPY (EGD) WITH PROPOFOL Left 02/20/2021   Procedure: ESOPHAGOGASTRODUODENOSCOPY (EGD) WITH PROPOFOL;  Surgeon: Daryel November, MD;  Location: [place];  Service: Gastroenterology;  Laterality: Left;   GASTRIC BYPASS  10/04/2013   GASTRIC ROUX-EN-Y N/A 10/04/2013   Procedure: LAPAROSCOPIC ROUX-EN-Y GASTRIC BYPASS WITH UPPER ENDOSCOPY;  Surgeon: Shann Medal, MD;  Location: [facility] ORS;  Service: General;  Laterality: N/A;   HEMORRHOID BANDING  02/2013   IR  PERC CHOLECYSTOSTOMY  03/06/2021   POLYPECTOMY     POLYPECTOMY  07/26/2019   Procedure: POLYPECTOMY;  Surgeon: Gatha Mayer, MD;  Location: [facility];  Service: Endoscopy;;   PUBOVAGINAL SLING  09/06/2011   Procedure: Gaynelle Arabian;  Surgeon: Ailene Rud, MD;  Location[facility];  Service: Urology;  Laterality: N/A;   REDUCTION MAMMAPLASTY Bilateral 2001   SHOULDER ARTHROSCOPY Right 10/23/2017   Procedure: RIGHT SHOULDER ARTHROSCOPIC ROTATOR CUFF  REPAIR VS. DEBRIDEMENT, SUBACROMIAL DECOMPARESSION, DISTAL CLAVICAL EXCISION;  Surgeon: Tania Ade, MD;  Location: [place];  Service: Orthopedics;  Laterality: Right;   UVULOPALATOPHARYNGOPLASTY, TONSILLECTOMY AND SEPTOPLASTY  1994    Allergies: Nsaids and Statins  Medications: Prior to Admission medications   Medication Sig Start Date End Date Taking? Authorizing Provider  Blood Glucose Monitoring Suppl (FREESTYLE LITE) w/Device KIT Use up to 4 times daily to check blood glucose daily as directed. 04/01/21   [name], MD  gabapentin (NEURONTIN) 300 MG capsule Take 1 capsule by mouth 2 times daily. 04/10/21   Lyndal Pulley, DO  glucose blood (FREESTYLE LITE) test strip Use daily as instructed. 01/05/21   [name], MD  glucose blood (FREESTYLE LITE) test strip Use up to 4 times daily to check blood glucose as directed. 04/01/21     Lancets (FREESTYLE) lancets Use 1 lancet up to 4 times daily to check blood glucose as directed. 04/01/21     Multiple Vitamins-Minerals (MULTIVITAMIN PO) Take 2 tablets by mouth daily.     [provider]  omeprazole (PRILOSEC) 40 MG capsule Take 1 capsule by mouth 2 times daily before a meal. Open capsule and sprinkle in water, other liquid or applesauce 02/27/21   Gatha Mayer, MD  ondansetron (ZOFRAN-ODT) 4 MG disintegrating tablet DISSOLVE 1 TABLET BY MOUTH EVERY 8 HOURS AS NEEDED FOR NAUSEA OR VOMITING (MAY TAKE 2 TABLETS IF 1 TABLET IS INEFFECTIVE) Patient taking differently: Take 4-8 mg by mouth every 8 (eight) hours as needed for nausea or vomiting. 06/26/20 06/26/21  Gatha Mayer, MD  polyethylene glycol powder (MIRALAX) 17 GM/SCOOP powder Take 17 g by mouth daily. 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[employer]   Social Determinants of Health   Financial Resource Strain: Not on file  Food Insecurity: No Food Insecurity   Worried About Charity fundraiser in  the Last Year: Never true   Ran Out of Food in the Last Year: Never true  Transportation Needs: No Transportation Needs   Lack of Transportation (Medical): No   Lack of Transportation (Non-Medical): No  Physical Activity: Not on file  Stress: Not on file  Social Connections: Not on file     Vital Signs: There were no vitals taken for this visit.  Physical Exam Skin:    General: Skin is warm.     Comments: Site is clean and dry NT no bleeding OP is minimal in bag 10 cc for 2-3 days collection per pt Yellow/light brown  CT showing collection is much better-- almost resolved Drain injection show no additional abscess connection No biliary ducts are visualized  Aspiration of contrast with several minute stones noted This did loosen flow of drain    Imaging: No results found.  Labs:  CBC: Recent Labs    03/06/21 1037 03/07/21 0505 03/08/21 0503 03/09/21 0446  WBC 14.7* 26.0* 12.2* 7.0  HGB 11.0* 10.8* 10.2* 10.1*  HCT 33.1* 32.2* 31.8* 31.6*  PLT 634* 532* 509* 503*    COAGS: Recent Labs     03/06/21 1947  INR 1.2    BMP: Recent Labs    03/06/21 1037 03/07/21 0505 03/08/21 0503 03/09/21 0446  NA 131* 136 138 141  K 3.0* 3.9 2.9* 3.4*  CL 90* 94* 102 107  CO2 _0 GLUCOSE 155* 172* 110* 91  BUN _1 CALCIUM 8.3* 8.4* 8.2* 8.1*  CREATININE 0.35* 0.58 0.55 0.46  GFRNONAA >60 >60 >60 >60    LIVER FUNCTION TESTS: Recent Labs    02/20/21 0616 03/05/21 1426 03/06/21 1037 03/07/21 0505  BILITOT 0.9 0.7 0.9 0.7  AST _2 38  ALT _3 ALKPHOS 60 150* 143* 145*  PROT 5.9* 6.8 6.4* 5.8*  ALBUMIN 2.6* 3.1* 2.3* 2.0*    Assessment:  Percutaneous chole drain intact OP slowing-- flushes daily 10 cc  Injection of drain and aspiration did seem to loosen flow of tubing Dr Serafina Royals: keep drain in place Pt to follow with Dr Johney Maine Plan is for lap chole 05/31/21 Dr Serafina Royals will discuss all findings with Dr Johney Maine   Signed: Lavonia Drafts, PA-C 04/12/2021, 2:03 PM   Please refer to Dr. Sharol Given attestation of this note for management and plan.

## 2021-04-16 ENCOUNTER — Other Ambulatory Visit (HOSPITAL_COMMUNITY): Payer: Self-pay | Admitting: Surgery

## 2021-04-16 ENCOUNTER — Encounter: Payer: 59 | Admitting: Internal Medicine

## 2021-04-16 ENCOUNTER — Encounter (HOSPITAL_COMMUNITY): Payer: Self-pay | Admitting: Radiology

## 2021-04-16 DIAGNOSIS — K651 Peritoneal abscess: Secondary | ICD-10-CM

## 2021-04-16 NOTE — Progress Notes (Signed)
Patient Name  Sandra [name], Feuerstein Legal Sex  Female DOB  02/13/1961 SSN  [id number] Address  [address]  [place] [address] Phone  [phone] [facility])  [phone] (Work)  [phone] (Mobile)   FW: Large RUQ abscess - f/u drain study Received: 3 days ago Suttle, Rosanne Ashing, MD  [facility] Team,   Please schedule Ms. Oertel for CT guided abdominal fluid collection aspiration/possible drainage.   Thank you,   Dylan        Previous Messages   ----- Message -----  From: Michael Boston, MD  Sent: 04/12/2021   4:53 PM EST  To: Illene Regulus, Suzette Battiest, MD  Subject: RE: Large RUQ abscess - f/u drain study         It looks like the more medial remnant of the large multiloculated abscess and seems separate from the stomach/pylorus and duodenal bulb although certainly it sitting on top of it.  She does have a funny looking duodenal bulb on the more much normal October CT scan but collection is more on the gastric side and seems distinct from that   there seems like a decent window.  Since it is more than 3 cm, perhaps consider doing an aspiration and reculture to make sure were not missing anything?   Thanks for the update,   Richardson Landry    ----- Message -----  From: Suzette Battiest, MD  Sent: 04/12/2021   4:09 PM EST  To: Michael Boston, MD  Subject: RE: Large RUQ abscess - f/u drain study         Richardson Landry,   I saw Ms. Kapuscinski today.  Overall things look much better and her gallbladder is totally decompressed.     She does have an indeterminate fluid collection just anterior to the 1st portion of the duodenum (see CT from today, axial series 2, slice 29).  I can't tell if this is a duodenal diverticulum or persistent abscess.   We did a cholecystogram to assess, and didn't see anything that looks like this, so it's unlikely fistulized to from the gallbladder.  Her cystic duct is likely still occluded/severely  stenosed.   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     We'd be happy to stick this collection and aspirate to see what comes out of it, and possibly place another drain if it looks purulent.  Watchful waiting with repeat CT in a few weeks would be reasonable too.     Let me know what you think and I'm happy to get her set up if you'd like [place] to aspirate.    Best,   Dylan  ----- Message -----  From: Michael Boston, MD  Sent: 03/27/2021   8:43 AM EST  To: Illene Regulus, Suzette Battiest, MD  Subject: RE: Large RUQ abscess - f/u drain study         thanks  ----- Message -----  From: Suzette Battiest, MD  Sent: 03/27/2021   8:25 AM EST  To: Michael Boston, MD  Subject: RE: LArge abscess - f/u drain study             Elnora Morrison,   I like your thought process - with the complexity of her abscess/gallbladder, taking another look with CT +/- drain injection is warranted.   I'll arrange to get her into drain clinic within the next 2 weeks.   Best,   Dylan  ----- Message -----  From: Michael Boston, MD  Sent: 03/26/2021   6:01 PM EST  To: Suzette Battiest, MD  Subject: LArge abscess - f/u drain study                 Pleasant woman that you drained over a liter of pus out of the 20 cm abscess in the right upper quadrant presumed due to a perforated gallbladder 03/06/2021.  She is off antibiotics and doing better.   She did have an episode where the drain did not drain last weekend and she had worsening pain.  It is starting to drain now.  The drainage has gone down but she still draining pus.  She is not due for follow-up drain study until early January.  I wonder if it would be wise to consider a follow-up drain study sooner in December with possible CT scan to make sure that all the abscess has been drained and it does not need to be upsized or adjusted.   I am not planning to do any interval cholecystectomy until 10-12 weeks from surgery to allow the giant abscess  to resolve before considering cholecystectomy ,and this may be one instance where a follow-up CT plus or minus drain study before surgery would be helpful to know when it is safe to do cholecystectomy but not leave the drain in for 3-6 months   Let me know if you think that is reasonable to move her drain study up into mid December or if you could forward this to whoever is running the outpatient drain clinic.  Do not know if that is Dr. Pascal Lux or one of the PAs.   Your regularly humbled and somewhat obedient but definitely older colleague,   Waldron Session, MD, FACS, MASCRS  Esophageal, Gastrointestinal & Colorectal Surgery  Robotic and Minimally Invasive Surgery   [facility], [place]   [phone] mobile

## 2021-04-19 ENCOUNTER — Other Ambulatory Visit: Payer: Self-pay | Admitting: Radiology

## 2021-04-20 ENCOUNTER — Other Ambulatory Visit (HOSPITAL_COMMUNITY): Payer: Self-pay | Admitting: Surgery

## 2021-04-20 ENCOUNTER — Ambulatory Visit (HOSPITAL_COMMUNITY)
Admission: RE | Admit: 2021-04-20 | Discharge: 2021-04-20 | Disposition: A | Discharge status: Home / Self Care | Payer: 59 | Source: Ambulatory Visit | Attending: Surgery | Admitting: Surgery

## 2021-04-20 ENCOUNTER — Other Ambulatory Visit: Payer: Self-pay

## 2021-04-20 DIAGNOSIS — K810 Acute cholecystitis: Secondary | ICD-10-CM | POA: Diagnosis not present

## 2021-04-20 DIAGNOSIS — K651 Peritoneal abscess: Secondary | ICD-10-CM

## 2021-04-20 DIAGNOSIS — K3189 Other diseases of stomach and duodenum: Secondary | ICD-10-CM | POA: Diagnosis not present

## 2021-04-20 DIAGNOSIS — R188 Other ascites: Secondary | ICD-10-CM | POA: Diagnosis not present

## 2021-04-20 DIAGNOSIS — K6389 Other specified diseases of intestine: Secondary | ICD-10-CM | POA: Diagnosis not present

## 2021-04-20 DIAGNOSIS — K6811 Postprocedural retroperitoneal abscess: Secondary | ICD-10-CM | POA: Diagnosis not present

## 2021-04-20 LAB — CBC
HCT: 37.1 % (ref 36.0–46.0)
Hemoglobin: 12 g/dL (ref 12.0–15.0)
MCH: 30.1 pg (ref 26.0–34.0)
MCHC: 32.3 g/dL (ref 30.0–36.0)
MCV: 93 fL (ref 80.0–100.0)
Platelets: 288 10*3/uL (ref 150–400)
RBC: 3.99 MIL/uL (ref 3.87–5.11)
RDW: 13.7 % (ref 11.5–15.5)
WBC: 5.8 10*3/uL (ref 4.0–10.5)
nRBC: 0 % (ref 0.0–0.2)

## 2021-04-20 MED ORDER — MIDAZOLAM HCL 2 MG/2ML IJ SOLN
INTRAMUSCULAR | Status: AC
  Filled 2021-04-20: qty 4

## 2021-04-20 MED ORDER — SODIUM CHLORIDE 0.9 % IV SOLN: INTRAVENOUS | Status: DC

## 2021-04-20 MED ORDER — FENTANYL CITRATE (PF) 100 MCG/2ML IJ SOLN
INTRAMUSCULAR | Status: AC
  Filled 2021-04-20: qty 4

## 2021-04-20 NOTE — Sedation Documentation (Addendum)
Dr Annamaria Boots spoke with patient does not have to have procedure at this time. Verbalized understanding and discharged. Daughter taking patient home.

## 2021-04-20 NOTE — Procedures (Signed)
Interventional Radiology Procedure Note  Procedure: CT limited abd only    Complications: None  Estimated Blood Loss:  0  Findings: Initial CT for localization of the RUQ anterior small collection shows that the fluid has resolved.  No need for aspiration    Tamera Punt, MD

## 2021-04-20 NOTE — H&P (Signed)
Chief Complaint: Patient was seen in consultation today for RUQ fluid collection.  Referring Physician(s): Gross,Steven  Supervising Physician: Daryll Brod  Patient Status: [facility] - Out-pt  History of Present Illness: Sandra [name] is a 60 y.o. female with a past medical history significant for anxiety, depression, GERD, DM, CAD, HTN, HLD and acute cholecystitis s/p percutaneous cholecystostomy placement 03/06/21 who presents today for RUQ fluid collection aspiration/possible drain placement. Sandra [name] was seen in IR clinic for drain follow up 04/12/21 and was found to have an indeterminate fluid collection just anterior to the 1st portion of the duodenum on CT. Drain injection did not note fistula, however she was found to have a persistently occluded vs severely stenosed cystic duct. After discussion with Dr. Johney Maine (general surgery) decision was made to proceed with RUQ fluid collection aspiration/possible drain placement for which she presents today.  Sandra [name] denies any complaints, she is not sure why she is here as she has not spoken to her surgeon since November. We reviewed indication for procedure today - she understands and is agreeable to proceed. She has been feeling well overall, occasionally has nausea due to metal taste in her mouth but no vomiting. She has not had any problems with her existing drain.  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 HYSTERECTOMY   STAGE I ENDOMETROID CARCINOMA   Hx of adenomatous colonic polyps 2011   BENIGN   Hyperlipidemia    no meds now   Hypertension    Sleep apnea    no CPAP since surgery    Past Surgical History:  Procedure Laterality Date   ABDOMINAL HYSTERECTOMY  12-20-2004   TAH   BILATERAL BREAST REDUCTION  11-16-1999   BIOPSY  02/20/2021   Procedure: BIOPSY;  Surgeon: Daryel November, MD;  Location: [facility] ENDOSCOPY;  Service: Gastroenterology;;   Rosanne Gutting PYLORI N/A 05/27/2013   Procedure: Lauris Chroman;  Surgeon: Shann Medal, MD;  Location: [facility];  Service: General;  Laterality: N/A;   [place] N/A 04/25/2014   Procedure: COLONOSCOPY;  Surgeon: Gatha Mayer, MD;  Location: [facility] ENDOSCOPY;  Service: Endoscopy;  Laterality: N/A;   COLONOSCOPY WITH PROPOFOL N/A 07/26/2019   Procedure: COLONOSCOPY WITH PROPOFOL;  Surgeon: Gatha Mayer, MD;  Location: [facility] ENDOSCOPY;  Service: Endoscopy;  Laterality: N/A;   CYSTOSCOPY  09/06/2011   Procedure: CYSTOSCOPY;  Surgeon: Ailene Rud, MD;  Location: [facility];  Service: Urology;  Laterality: N/A;  LYNX SLING    ESOPHAGOGASTRODUODENOSCOPY (EGD) WITH PROPOFOL N/A 09/27/2014  Procedure: ESOPHAGOGASTRODUODENOSCOPY (EGD) WITH PROPOFOL;  Surgeon: Gatha Mayer, MD;  Location: [facility] ENDOSCOPY;  Service: Endoscopy;  Laterality: N/A;   ESOPHAGOGASTRODUODENOSCOPY (EGD) WITH PROPOFOL N/A 03/11/2016   Procedure: ESOPHAGOGASTRODUODENOSCOPY (EGD) WITH PROPOFOL;  Surgeon: Jerene Bears, MD;  Location: [facility] ENDOSCOPY;  Service: Endoscopy;  Laterality: N/A;   ESOPHAGOGASTRODUODENOSCOPY (EGD) WITH PROPOFOL Left 02/20/2021   Procedure: ESOPHAGOGASTRODUODENOSCOPY (EGD) WITH PROPOFOL;  Surgeon: Daryel November, MD;  Location: [facility];  Service: Gastroenterology;  Laterality: Left;   GASTRIC BYPASS  10/04/2013   GASTRIC ROUX-EN-Y N/A 10/04/2013   Procedure: LAPAROSCOPIC ROUX-EN-Y GASTRIC BYPASS WITH UPPER ENDOSCOPY;  Surgeon: Shann Medal, MD;  Location: [facility] ORS;  Service: General;  Laterality: N/A;   HEMORRHOID BANDING  02/2013   IR PERC CHOLECYSTOSTOMY  03/06/2021   IR RADIOLOGIST EVAL & MGMT  04/12/2021   POLYPECTOMY     POLYPECTOMY  07/26/2019   Procedure: POLYPECTOMY;  Surgeon: Gatha Mayer, MD;  Location: [facility] ENDOSCOPY;  Service: Endoscopy;;   PUBOVAGINAL SLING  09/06/2011   Procedure: Gaynelle Arabian;  Surgeon: Ailene Rud, MD;  Location: [facility];  Service: Urology;  Laterality: N/A;   REDUCTION MAMMAPLASTY Bilateral 2001   SHOULDER ARTHROSCOPY Right 10/23/2017   Procedure: RIGHT SHOULDER ARTHROSCOPIC ROTATOR CUFF REPAIR VS. DEBRIDEMENT, SUBACROMIAL DECOMPARESSION, DISTAL CLAVICAL EXCISION;  Surgeon: Tania Ade, MD;  Location: [facility];  Service: Orthopedics;  Laterality: Right;   UVULOPALATOPHARYNGOPLASTY, TONSILLECTOMY AND SEPTOPLASTY  1994    Allergies: Nsaids and Statins  Medications: Prior to Admission medications   Medication Sig Start Date End Date Taking? Authorizing Provider  gabapentin (NEURONTIN) 300 MG capsule Take 1 capsule by mouth 2 times daily. 04/10/21  Yes Lyndal Pulley, DO  Multiple Vitamins-Minerals (MULTIVITAMIN PO) Take 2 tablets by mouth daily.    Yes [provider]  omeprazole (PRILOSEC) 40 MG capsule Take 1 capsule by mouth 2 times daily before a meal. Open capsule and sprinkle in water, other liquid or applesauce 02/27/21  Yes Gatha Mayer, MD  ondansetron (ZOFRAN-ODT) 4 MG disintegrating tablet DISSOLVE 1 TABLET BY MOUTH EVERY 8 HOURS AS NEEDED FOR NAUSEA OR VOMITING (MAY TAKE 2 TABLETS IF 1 TABLET IS INEFFECTIVE) Patient taking differently: Take 4-8 mg by mouth every 8 (eight) hours as needed for nausea or  vomiting. 06/26/20 06/26/21 Yes Gatha Mayer, MD  polyethylene glycol powder (MIRALAX) 17 GM/SCOOP powder Take 17 g by mouth daily. 02/20/21  Yes Kayleen Memos, DO  Blood Glucose Monitoring Suppl (FREESTYLE LITE) w/Device KIT Use up to 4 times daily to check blood glucose daily as directed. 04/01/21   [name], MD  glucose blood (FREESTYLE LITE) test strip Use daily as instructed. 01/05/21   [name], MD  glucose blood (FREESTYLE LITE) test strip Use up to 4 times daily to check blood glucose as directed. 04/01/21     Lancets (FREESTYLE) lancets Use 1 lancet up to 4 times daily to check blood glucose as directed. 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[employer]   Social Determinants of Health   Financial Resource Strain: Not on file  Food Insecurity: No Food Insecurity   Worried About Charity fundraiser in the Last Year: Never true   Arboriculturist in the Last Year: Never true  Transportation Needs: No Transportation Needs   Lack of Transportation (Medical):  No   Lack of Transportation (Non-Medical): No  Physical Activity: Not on file  Stress: Not on file  Social Connections: Not on file     Review of Systems: A 12 point ROS discussed and pertinent positives are indicated in the HPI above.  All other systems are negative.  Review of Systems  Constitutional:  Negative for chills and fever.  Respiratory:  Negative for cough and shortness of breath.   Cardiovascular:  Negative for chest pain.  Gastrointestinal:  Negative for abdominal pain, diarrhea, nausea and vomiting.  Musculoskeletal:  Negative for back pain.  Neurological:  Negative for dizziness and headaches.   Vital Signs: BP 114/79    Pulse 85    Temp 98.4 F (36.9 C) (Oral)    Resp 16    Ht 5' 2" (1.575 m)    Wt 137 lb 12.8 oz (62.5 kg)    SpO2 99%    BMI 25.20 kg/m   Physical Exam Vitals reviewed.  Constitutional:      General: She is not in acute distress. HENT:     Head: Normocephalic.     Mouth/Throat:     Mouth: Mucous membranes are moist.     Pharynx: Oropharynx is clear. No oropharyngeal exudate or posterior oropharyngeal erythema.  Cardiovascular:     Rate and Rhythm: Normal rate and regular rhythm.  Pulmonary:     Effort: Pulmonary effort is normal.     Breath sounds: Normal breath sounds.  Abdominal:     General: There is no distension.     Palpations: Abdomen is soft.     Tenderness: There is no abdominal tenderness.  Skin:    General: Skin is warm and dry.  Neurological:     Mental Status: She is alert and oriented to person, place, and time.  Psychiatric:        Mood and Affect: Mood normal.        Behavior: Behavior normal.        Thought Content: Thought content normal.        Judgment: Judgment normal.     MD Evaluation Airway: WNL Heart: WNL Abdomen: WNL Chest/ Lungs: WNL ASA  Classification: 2 Mallampati/Airway Score: One   Imaging: CT ABDOMEN PELVIS W CONTRAST  Result Date: 04/12/2021 CLINICAL DATA:  60 year old female with  history acute, likely perforated walled-off cholecystitis status post percutaneous cholecystostomy tube placement on 03/06/2021. She presents for initial drain clinic follow-up after discharge. EXAM: CT ABDOMEN AND PELVIS WITHOUT CONTRAST TECHNIQUE: Multidetector CT imaging  of the abdomen and pelvis was performed following the standard protocol without IV contrast. COMPARISON:  03/06/2021 FINDINGS: Lower chest: No acute abnormality. Hepatobiliary: The liver is normal in size, contour, and attenuation. No focal lesions. Percutaneous, transhepatic cholecystostomy tube is in place with the pigtail portion coiled in the infundibulum. The gallbladder is relatively decompressed, however thick-walled measuring up to approximately 7 mm. No significant surrounding and inflammatory changes. Just anterior to the gastric antrum and inferomedial to the left lobe of the liver is an approximately 4.4 x 2.0 cm fluid collection with a focus of gas in the similar location of the more medial, largest component of the previously visualized abscess. This collection, however is in very close proximity to the first portion of the duodenum with indeterminate communication. Pancreas: Unremarkable. No pancreatic ductal dilatation or surrounding inflammatory changes. Spleen: Unchanged 1.4 cm simple cyst in the lower pole. The spleen is otherwise within normal limits. Adrenals/Urinary Tract: Adrenal glands are unremarkable. Unchanged punctate right lower pole renal calculus without evidence of obstruction. Kidneys are normal, without focal lesion or hydronephrosis. Bladder is unremarkable. Stomach/Bowel: Postsurgical changes after antecolic Roux-en-Y gastric bypass without complicating features. Appendix appears normal. No evidence of bowel wall thickening, distention, or inflammatory changes. Vascular/Lymphatic: Aortic atherosclerosis. No enlarged abdominal or pelvic lymph nodes. Reproductive: Status post hysterectomy. No adnexal masses.  Other: No ascites.  No abdominal wall hernia. Musculoskeletal: Mild multilevel discogenic degenerative changes of the visualized thoracolumbar spine, unchanged. No acute osseous abnormality. IMPRESSION: 1. Significant interval improvement and resolution of large right upper quadrant abscess associated with cholecystitis after percutaneous cholecystostomy tube placement. There is possible small residual gas containing fluid collection just anterior to the gastric antrum measuring up to approximately 4.4 cm. This finding may represent a redundant or diverticular loop of proximal duodenum, though is in the same anatomic location of the largest medial aspect of the previously visualized abscess. 2. Similar appearing nonobstructive right inferior pole punctate nephrolithiasis. PLAN: The patient will undergo cholecystostomy tube fluoroscopic evaluation to assess for communication with the above mention indeterminate fluid collection. Ruthann Cancer, MD [facility] Radiology Electronically Signed   By: Ruthann Cancer M.D.   On: 04/12/2021 15:00   DG Sinus/Fist Tube Chk-Non GI  Result Date: 04/12/2021 INDICATION: History of acute cholecystitis enlarged right upper quadrant abscess, post ultrasound fluoroscopic guided cholecystostomy tube placement. CT abdomen pelvis today demonstrating significant improvement, however possible additional abscess adjacent to the first portion the duodenum. EXAM: FLUOROSCOPIC GUIDED CHOLECYSTOSTOMY TUBE INJECTION COMPARISON:  03/06/2021, 04/12/2021 MEDICATIONS: None ANESTHESIA/SEDATION: None CONTRAST:  18 mL Isovue 300-administered into the gallbladder fossa. FLUOROSCOPY TIME:  0 minutes 54 seconds (4 mGy) COMPLICATIONS: None immediate. PROCEDURE: The patient was positioned supine on the fluoroscopy table. A preprocedural spot fluoroscopic image was obtained of the right upper abdominal quadrant existing cholecystostomy tube. Multiple spot  fluoroscopic radiographic images were obtained of the right upper abdominal quadrant an existing cholecystostomy tube following injection of a small amount of contrast. Images were reviewed and discussed with the patient. The cholecystostomy tube was flushed with a small amount of saline and placed back to bag drainage. A dressing was placed. The patient tolerated the procedure well without immediate postprocedural complication. FINDINGS: Preprocedural spot fluoroscopic image of the right upper abdominal quadrant demonstrates grossly unchanged positioning of the cholecystostomy tube with end coiled and locked overlying the expected location of the gallbladder fundus. S Ubsequent contrast injection demonstrates appropriate functionality of the cholecystostomy tube with brisk opacification of the gallbladder. There  is markedly irregular intraluminal appearance of the gallbladder. There was delayed antegrade passage of contrast into what appears to be the cystic duct (see key image) there is no prompt propagation through the cystic duct into the duodenum. There is no filling of the additional fluid collection adjacent to the first portion the duodenum on comparison CT. IMPRESSION: Appropriately positioned and functioning cholecystostomy tube. Possible but indeterminate patency of the cystic duct. No evidence of fistulization from the gallbladder to surrounding structures. PLAN: The patient's cholecystostomy tube was placed back to bag drainage. Additional follow-up as per Surgery. Percutaneous aspiration and possible drainage of fluid collection question could be considered. Ruthann Cancer, MD [facility] Radiology Electronically Signed   By: Ruthann Cancer M.D.   On: 04/12/2021 16:01   IR Radiologist Eval & Mgmt  Result Date: 04/12/2021 Please refer to notes tab for details about interventional procedure. (Op Note)   Labs:  CBC: Recent Labs    03/06/21 1037  03/07/21 0505 03/08/21 0503 03/09/21 0446  WBC 14.7* 26.0* 12.2* 7.0  HGB 11.0* 10.8* 10.2* 10.1*  HCT 33.1* 32.2* 31.8* 31.6*  PLT 634* 532* 509* 503*    COAGS: Recent Labs    03/06/21 1947  INR 1.2    BMP: Recent Labs    03/06/21 1037 03/07/21 0505 03/08/21 0503 03/09/21 0446  NA 131* 136 138 141  K 3.0* 3.9 2.9* 3.4*  CL 90* 94* 102 107  CO2 _0 GLUCOSE 155* 172* 110* 91  BUN _1 CALCIUM 8.3* 8.4* 8.2* 8.1*  CREATININE 0.35* 0.58 0.55 0.46  GFRNONAA >60 >60 >60 >60    LIVER FUNCTION TESTS: Recent Labs    02/20/21 0616 03/05/21 1426 03/06/21 1037 03/07/21 0505  BILITOT 0.9 0.7 0.9 0.7  AST _2 38  ALT _3 ALKPHOS 60 150* 143* 145*  PROT 5.9* 6.8 6.4* 5.8*  ALBUMIN 2.6* 3.1* 2.3* 2.0*    TUMOR MARKERS: No results for input(s): AFPTM, CEA, CA199, CHROMGRNA in the last 8760 hours.  Assessment and Plan:  59 y/o F with history of acute cholecystitis s/p percutaneous cholecystostomy placement 03/06/21 found to have indeterminate RUQ fluid collection on recent follow up CT who presents today for aspiration/possible drain placement within this fluid collection.  Risks and benefits discussed with the patient including bleeding, infection, damage to adjacent structures, bowel perforation/fistula connection, and sepsis.  All of the patient's questions were answered, patient is agreeable to proceed.  Consent signed and in chart.  Thank you for this interesting consult.  I greatly enjoyed meeting Sandra [name] and look forward to participating in their care.  A copy of this report was sent to the requesting provider on this date.  Electronically Signed: Joaquim Nam, PA-C 04/20/2021, 9:39 AM   I spent a total of  25 Minutes in face to face in clinical consultation, greater than 50% of which was counseling/coordinating care for RUQ fluid collection aspiration/possible drain placement.

## 2021-04-24 ENCOUNTER — Ambulatory Visit: Payer: 59 | Admitting: *Deleted

## 2021-04-24 DIAGNOSIS — K651 Peritoneal abscess: Secondary | ICD-10-CM

## 2021-04-24 DIAGNOSIS — E7800 Pure hypercholesterolemia, unspecified: Secondary | ICD-10-CM

## 2021-04-24 DIAGNOSIS — E119 Type 2 diabetes mellitus without complications: Secondary | ICD-10-CM

## 2021-04-24 NOTE — Chronic Care Management (AMB) (Signed)
Care Management    RN Visit Note  04/24/2021 Name: Sandra [name] MRN: [id number] DOB: 06-04-1960  Subjective: Sandra [name] is a 60 y.o. year old female who is a primary care Sandra of Burns, Claudina Lick, MD. The care management team was consulted for assistance with disease management and care coordination needs.    Engaged with Sandra by telephone for follow up visit in response to provider referral for case management and/or care coordination services.   Consent to Services:   Sandra [name] was given information about Care Management services 03/21/21 including:  Care Management services includes personalized support from designated clinical staff supervised by her physician, including individualized plan of care and coordination with other care providers 24/7 contact phone numbers for assistance for urgent and routine care needs. The Sandra may stop case management services at any time by phone call to the office staff.  Sandra agreed to services and consent obtained.   Assessment: Review of Sandra past medical history, allergies, medications, health status, including review of consultants reports, laboratory and other test data, was performed as part of comprehensive evaluation and provision of chronic care management services.   Care Plan Allergies  Allergen Reactions   Nsaids     r/t gastric surgery   Statins    Outpatient Encounter Medications as of 04/24/2021  Medication Sig   Blood Glucose Monitoring Suppl (FREESTYLE LITE) w/Device KIT Use up to 4 times daily to check blood glucose daily as directed.   gabapentin (NEURONTIN) 300 MG capsule Take 1 capsule by mouth 2 times daily.   glucose blood (FREESTYLE LITE) test strip Use daily as instructed.   glucose blood (FREESTYLE LITE) test strip Use up to 4 times daily to check blood glucose as directed.   Lancets (FREESTYLE) lancets Use 1 lancet up to 4 times daily to check blood glucose as directed.   Multiple  Vitamins-Minerals (MULTIVITAMIN PO) Take 2 tablets by mouth daily.    omeprazole (PRILOSEC) 40 MG capsule Take 1 capsule by mouth 2 times daily before a meal. Open capsule and sprinkle in water, other liquid or applesauce   ondansetron (ZOFRAN-ODT) 4 MG disintegrating tablet DISSOLVE 1 TABLET BY MOUTH EVERY 8 HOURS AS NEEDED FOR NAUSEA OR VOMITING (MAY TAKE 2 TABLETS IF 1 TABLET IS INEFFECTIVE) (Sandra taking differently: Take 4-8 mg by mouth every 8 (eight) hours as needed for nausea or vomiting.)   polyethylene glycol powder (MIRALAX) 17 GM/SCOOP powder Take 17 g by mouth daily.   Sodium Chloride Flush (NORMAL SALINE FLUSH) 0.9 % SOLN Flush drain once daily as directed   No facility-administered encounter medications on file as of 04/24/2021.   Sandra Active Problem List   Diagnosis Date Noted   Cholelithiasis 04/06/2021   Abscess 04/06/2021   Intra-abdominal abscess ([place]) 03/06/2021   Normocytic anemia 03/06/2021   Hypokalemia 03/06/2021   Hyponatremia 03/06/2021   Thrombocytosis 03/06/2021   Moderate protein-calorie malnutrition ([place]) 03/06/2021   Abdominal pain 02/17/2021   Right carpal tunnel syndrome 11/10/2020   Labral tear of right hip joint 11/10/2020   Chronic right hip pain 04/10/2020   Nonallopathic lesion of sacral region 08/05/2019   Greater trochanteric bursitis, right 08/05/2019   Benign neoplasm of descending colon    Low back pain 05/27/2019   Hematoma of right hip 05/07/2019   Trigger finger, right index finger 02/17/2019   Anxiety 11/12/2018   Left lateral epicondylitis 05/14/2018   Left elbow pain 05/04/2018   Right rotator cuff tear 08/20/2017  Cervical disc disorder with radiculopathy of cervical region 04/25/2016   Nonallopathic lesion of cervical region 04/17/2016   Nonallopathic lesion of thoracic region 04/17/2016   Nonallopathic lesion of rib cage 04/17/2016   Nonallopathic lesion of lumbosacral region 04/17/2016   Degenerative cervical disc  03/29/2016   Degenerative arthritis of right knee 03/29/2016   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   Adjustment disorder with mixed anxiety and depressed mood 07/14/2014   Personal history of colonic polyps    History of Roux-en-Y gastric bypass, 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   Chronic constipation 03/05/2013   Coronary atherosclerosis of native coronary artery 08/27/2011   History of uterine cancer 08/15/2011   Type 2 diabetes mellitus ([place]) 08/15/2011   Hypercholesterolemia 08/15/2011   History of colonic polyps 12/29/2009   Conditions to be addressed/monitored: DMII and intra-abdominal abscess  Care Plan : RN Care Manager Plan of Care  Updates made by Knox Royalty, RN since 04/24/2021 12:00 AM     Problem: Chronic Disease Management Needs   Priority: High     Long-Range Goal: Development of plan of care for long term chronic disease management   Start Date: 03/30/2021  Expected End Date: 03/30/2022  Priority: High  Note:   Current Barriers:  Chronic Disease Management support and education needs related to HLD and DMII, recent surgery Recent hospitalization November 8-11, 2022 for intraabdominal abscess secondary to perforated gallbladder  RNCM Clinical Goal(s):  Sandra will demonstrate ongoing health management independence as evidenced by adherence to established plan of care for DMII/ recent surgery        through collaboration with RN Care manager, provider, and care team.   Interventions: 1:1 collaboration with primary care provider regarding development and update of comprehensive plan of care as evidenced by provider attestation and co-signature Inter-disciplinary care team collaboration (see longitudinal plan of care) Evaluation of current treatment plan related to  self management and Sandra's adherence to plan as established by provider Reviewed recent hospitalization with Sandra; current clinical condition: reports doing overall well, denies clinical concerns today Pain  assessment completed: denies pain today Confirmed Sandra has not yet obtained flu vaccine for 2022-23 flu/ winter season: encouraged Sandra to obtain this vaccine soon  Diabetes:  (Status: Goal on Track (progressing): YES.) Long Term Goal   Lab Results  Component Value Date   HGBA1C 6.4 (H) 02/20/2021  Provided education to Sandra about basic DM disease process; Reviewed prescribed diet with Sandra low carbohydrate, low sugar, heart healthy, low cholesterol; Counseled on importance of regular laboratory monitoring as prescribed;        Discussed plans with Sandra for ongoing care management follow up and provided Sandra with direct contact information for care management team;      Review of Sandra status, including review of consultants reports, relevant laboratory and other test results, and medications completed;       Confirmed Sandra monitors/ records blood sugars at home QD fasting: reviewed with Sandra today, she reports consistent values between 140-150; reports on days fasting blood sugars is > 150, she re-checks in afternoon, after eating: reports post-prandial values at home between 120-130 Confirmed Sandra adherent to prescribed diet: positive reinforcement provided with encouragement to continue efforts Confirms she obtained/ is using recently prescribed new glucometer  Recent surgery for intra-abdominal abscess  (Status: Goal on Track (progressing): YES.) Long Term Goal  Evaluation of current treatment plan related to  surgical follow up plan ,  self-management and Sandra's adherence to plan as established by provider. Discussed plans with Sandra  for ongoing care management follow up and provided Sandra with direct contact information for care management team Reviewed upcoming provider appointments: 05/01/21- IR appointment-- hopeful gallbladder drain will be removed; 05/18/21- pre-op visit for lap-chole, currently scheduled 05/31/21; 05/22/21- sports medicine provider-  confirmed Sandra aware of and has plans to attend as scheduled Confirmed Sandra self-managing gastric drain at home (JP); flushing to ensure patency; denies concerns around drain, reports "managing well" independently at home Discussed current plan for surgical follow up: once Sandra gets IR report/ hopeful drain removal 05/01/21- surgeon will follow up to confirm plan for lap-cholecystectomy, currently scheduled 05/31/21  Sandra Goals/Self-Care Activities: As evidenced by review of EHR, collaboration with care team, and Sandra reporting during CCM RN CM outreach,  Sandra [name] will: Take medications as prescribed Attend all scheduled provider appointments Call pharmacy for medication refills Call provider office for new concerns or questions Continue to check fasting (first thing in the morning, before eating) blood sugars at home- great job checking your blood sugars after eating on days it is higher than 150 in the morning Continue to follow heart healthy, low salt, low cholesterol, carbohydrate-modified, low sugar diet     Plan:  Telephone follow up appointment with care management team member scheduled for:  Thursday, June 07, 2021 at 9:00 am The Sandra has been provided with contact information for the care management team and has been advised to call with any health related questions or concerns  Oneta Rack, RN, BSN, [place] [phone]: direct office

## 2021-04-24 NOTE — Patient Instructions (Signed)
Visit Information  Sandra [name], thank you for taking time to talk with me today. Please don't hesitate to contact me if I can be of assistance to you before our next scheduled telephone appointment.  Below are the goals we discussed today:  Patient Self-Care Activities: Patient Sandra [name] will: Take medications as prescribed Attend all scheduled provider appointments Call pharmacy for medication refills Call provider office for new concerns or questions Continue to check fasting (first thing in the morning, before eating) blood sugars at home- great job checking your blood sugars after eating on days it is higher than 150 in the morning Continue to follow heart healthy, low salt, low cholesterol, carbohydrate-modified, low sugar diet  Our next scheduled telephone follow up visit/ appointment with care management team member is scheduled on:  Thursday, June 07, 2021 at 9:00 am  If you need to cancel or re-schedule our visit, please call [phone] and our care guide team will be happy to assist you.   I look forward to hearing about your progress.   Oneta Rack, RN, BSN, [place] [phone]: direct office  If you are experiencing a Mental Health or [place] or need someone to talk to, please  call the Suicide and Crisis Lifeline: 988 call the [place] National Suicide Prevention Lifeline: (431)817-2196 or TTY: 858-501-1190 TTY 970-219-9963) to talk to a trained counselor call 1-800-273-TALK (toll free, 24 hour hotline) go to [facility] [place] [phone]) call 911   Patient verbalizes understanding of instructions provided today and agrees to view in MyChart  Preventing Influenza, Adult Influenza, also known as the flu, is an infection caused by a virus. The flu mainly affects the nose, throat, and lungs (respiratory system). This infection causes  common cold symptoms, as well as a high fever and body aches. The flu spreads easily from person to person (is contagious). The flu is most common from December through March. This period of time is called flu season. You can catch the flu virus by: Breathing in droplets from an infected person's cough or sneeze. Touching something that recently got the virus on it (is contaminated) and then touching your mouth, nose, or eyes. How can this condition affect me? If you get the flu, your friends, family, and co-workers are also at risk of getting it because the flu spreads easily to others. Having the flu can lead to complications, such as pneumonia, ear infection, and sinus infection. The flu also can be life-threatening, especially for babies, people older than age 43, and people who have serious long-term diseases. You can protect yourself and other people by: Keeping your body's defense system (immune system) strong. Getting a flu shot, or influenza vaccination, each year. Practicing good health habits. What can increase my risk? The following factors may make you more likely to get the flu: Not washing or sanitizing your hands often. Having close contact with many people during cold and flu season. Touching your mouth, eyes, or nose without first washing or sanitizing your hands. Not getting a flu shot each year. Working in health care. You may be at risk for more serious flu symptoms if: You are older than age 56. You are pregnant. Your immune system is weak. You have a condition that makes the flu worse or life-threatening. You have severe obesity. What actions can I take to prevent this? Lifestyle You can lower your risk of getting the flu by  keeping your immune system in good shape. Do this by: Eating a healthy diet. Drinking plenty of fluids. Drink enough fluid to keep your urine pale yellow. Getting enough sleep. Exercising regularly. Do not use any products that contain  nicotine or tobacco. These products include cigarettes, chewing tobacco, and vaping devices, such as e-cigarettes. If you need help quitting, ask your health care provider. Medicines Getting a flu shot every year can lower your risk of getting the flu. This is the best way to prevent the flu. A flu shot is recommended for everyone age 92 months or older. It is best to get a flu shot in the fall, as soon as it is available. But getting a flu shot during winter or spring instead is still a good idea. Flu season can last into early spring. Preventing the flu through vaccination means getting a new flu shot every year. This is because the flu virus changes slightly (mutates) from one year to the next. The flu shot does not completely protect you from all flu virus mutations. If you get the flu after you get the shot, the shot can make your illness milder and go away sooner. It also can prevent dangerous complications of the flu. If you are pregnant, you can and should get a flu shot. Check with your health care provider before getting a flu shot if you have had a reaction to the shot in the past or if you are allergic to eggs. Sometimes the vaccine is available as a nasal spray. In some years, the nasal spray has not been as effective against the flu virus. Check with your health care provider if you have questions about this. Most people recover from the flu by resting at home and drinking plenty of fluids. However, a prescription antiviral medicine may reduce your flu symptoms and may make your flu go away sooner. This medicine must be started within a few days of getting flu symptoms. Antiviral medicine may be prescribed for people who are at risk for more serious flu symptoms. Talk with your health care provider about whether you need an antiviral medicine.  General information   You can also lower your risk of getting the flu by practicing good health habits. This is especially important during flu  season. Avoid contact with people who are sick with flu or cold symptoms. Wash your hands with soap and water often and for at least 20 seconds. If soap and water are not available, use alcohol-based hand sanitizer. Avoid touching your hands to your face, especially when you have not washed your hands recently. Use a cleanser that kills germs (a disinfectant) to clean surfaces at home and at work that may have the flu virus on them. If you do get the flu, avoid spreading it to others by: Staying home until your symptoms, including your fever, have been gone for at least 24 hours. Covering your mouth and nose when you cough or sneeze. Avoiding close contact with others, especially babies and older people.  Where to find more information Centers for Disease Control and Prevention: http://www.wolf.info/ American Academy of Family Physicians: www.familydoctor.org Contact a health care provider if: You have the flu and you develop new symptoms. You have diarrhea. You have a fever. Your cough gets worse, or you produce more mucus. Get help right away if you: Have trouble breathing. Have chest pain. These symptoms may represent a serious problem that is an emergency. Do not wait to see if the symptoms will go  away. Get medical help right away. Call your local emergency services (911 in the [place] Do not drive yourself to the hospital. Summary The best way to prevent the flu is to get a flu shot (influenza vaccination) every year in the fall. Even if you get the flu after you have received the yearly flu shot, your flu may be milder and go away sooner because of your flu shot. If you get the flu, antiviral medicines that are started within a few days of flu symptoms may reduce your symptoms and reduce how long the flu lasts. You can also help prevent the flu by practicing good health habits. This information is not intended to replace advice given to you by your health care provider. Make sure you discuss  any questions you have with your health care provider. Document Revised: 12/03/2019 Document Reviewed: 12/03/2019 Elsevier Patient Education  [employer].

## 2021-05-01 ENCOUNTER — Telehealth: Payer: Self-pay

## 2021-05-01 ENCOUNTER — Ambulatory Visit (HOSPITAL_COMMUNITY)
Admission: RE | Admit: 2021-05-01 | Discharge: 2021-05-01 | Disposition: A | Discharge status: Home / Self Care | Payer: 59 | Source: Ambulatory Visit | Attending: Radiology | Admitting: Radiology

## 2021-05-01 ENCOUNTER — Other Ambulatory Visit: Payer: Self-pay

## 2021-05-01 ENCOUNTER — Other Ambulatory Visit (HOSPITAL_COMMUNITY): Payer: Self-pay

## 2021-05-01 DIAGNOSIS — Z434 Encounter for attention to other artificial openings of digestive tract: Secondary | ICD-10-CM | POA: Diagnosis not present

## 2021-05-01 DIAGNOSIS — T85638A Leakage of other specified internal prosthetic devices, implants and grafts, initial encounter: Secondary | ICD-10-CM | POA: Diagnosis not present

## 2021-05-01 DIAGNOSIS — K82A2 Perforation of gallbladder in cholecystitis: Secondary | ICD-10-CM | POA: Diagnosis not present

## 2021-05-01 HISTORY — PX: IR CHOLANGIOGRAM EXISTING TUBE: IMG6040

## 2021-05-01 MED ORDER — IOHEXOL 300 MG/ML  SOLN
30.0000 mL | Freq: Once | INTRAMUSCULAR | Status: AC | PRN
  Administered 2021-05-01: 15 mL

## 2021-05-01 MED ORDER — LIDOCAINE HCL 1 % IJ SOLN
INTRAMUSCULAR | Status: DC | PRN
  Administered 2021-05-01: 2 mL
  Administered 2021-05-01: 3 mL

## 2021-05-01 MED ORDER — LIDOCAINE HCL 1 % IJ SOLN
INTRAMUSCULAR | Status: AC
  Filled 2021-05-01: qty 20

## 2021-05-01 NOTE — Procedures (Signed)
Interventional Radiology Procedure:   Indications: Poor function from cholecystostomy tube  Procedure: Cholecystostomy tube exchange  Findings: Old tube was occluded.  New 10 Fr tube placed.  Again noted is an irregular gallbladder.  Complications: No immediate complications noted.     EBL: Minimal  Plan: Continue with routine flushing until cholecystectomy.    Lemuel Boodram R. Anselm Pancoast, MD  Pager: [phone]

## 2021-05-01 NOTE — Telephone Encounter (Signed)
I have started filling out the disability claim form for [name] and will place it on Dr Celesta Aver desk to complete and sign.

## 2021-05-02 ENCOUNTER — Encounter: Payer: Self-pay | Admitting: Internal Medicine

## 2021-05-02 DIAGNOSIS — K810 Acute cholecystitis: Secondary | ICD-10-CM | POA: Insufficient documentation

## 2021-05-02 HISTORY — DX: Acute cholecystitis: K81.0

## 2021-05-02 NOTE — Telephone Encounter (Signed)
I spoke with Sandra [name] and confirmed the fax # to send her Disability form to: [phone], phone # [phone] It has been faxed and I will have it scanned into epic. [name] doesn't want a copy.

## 2021-05-04 ENCOUNTER — Other Ambulatory Visit (HOSPITAL_COMMUNITY): Payer: Self-pay

## 2021-05-09 ENCOUNTER — Other Ambulatory Visit (HOSPITAL_COMMUNITY): Payer: Self-pay

## 2021-05-17 NOTE — Patient Instructions (Addendum)
DUE TO COVID-19 ONLY ONE VISITOR IS ALLOWED TO COME WITH YOU AND STAY IN THE WAITING ROOM ONLY DURING PRE OP AND PROCEDURE.   **NO VISITORS ARE ALLOWED IN THE SHORT STAY AREA OR RECOVERY ROOM!!**       Your procedure is scheduled on: 05/31/21   Report to [facility] Main Entrance    Report to admitting at 9:15 AM   Call this number if you have problems the morning of surgery [phone]   Do not eat food :After Midnight.   May have liquids until 8:30 AM day of surgery  CLEAR LIQUID DIET  Foods Allowed                                                                     Foods Excluded  Water, Black Coffee and tea (no milk or creamer)           liquids that you cannot  Plain Jell-O in any flavor  (No red)                                    see through such as: Fruit ices (not with fruit pulp)                                            milk, soups, orange juice              Iced Popsicles (No red)                                               All solid food                                   Apple juices Sports drinks like Gatorade (No red) Lightly seasoned clear broth or consume(fat free) Sugar      The day of surgery:  Drink ONE (1) Pre-Surgery G2 at 8:30 AM the morning of surgery. Drink in one sitting. Do not sip.  This drink was given to you during your hospital  pre-op appointment visit. Nothing else to drink after completing the  Pre-Surgery G2.          If you have questions, please contact your surgeons office.     Oral Hygiene is also important to reduce your risk of infection.                                    Remember - BRUSH YOUR TEETH THE MORNING OF SURGERY WITH YOUR REGULAR TOOTHPASTE   Stop all vitamins and supplements 7 days before surgery   Take these medicines the morning of surgery with A SIP OF WATER: Tylenol, Omeprazole, Zofran  How to Manage Your Diabetes Before and After Surgery  Why is it important to control my blood  sugar before and  after surgery? Improving blood sugar levels before and after surgery helps healing and can limit problems. A way of improving blood sugar control is eating a healthy diet by:  Eating less sugar and carbohydrates  Increasing activity/exercise  Talking with your doctor about reaching your blood sugar goals High blood sugars (greater than 180 mg/dL) can raise your risk of infections and slow your recovery, so you will need to focus on controlling your diabetes during the weeks before surgery. Make sure that the doctor who takes care of your diabetes knows about your planned surgery including the date and location.  How do I manage my blood sugar before surgery? Check your blood sugar at least 4 times a day, starting 2 days before surgery, to make sure that the level is not too high or low. Check your blood sugar the morning of your surgery when you wake up and every 2 hours until you get to the Short Stay unit. If your blood sugar is less than 70 mg/dL, you will need to treat for low blood sugar: Do not take insulin. Treat a low blood sugar (less than 70 mg/dL) with  cup of clear juice (cranberry or apple), 4 glucose tablets, OR glucose gel. Recheck blood sugar in 15 minutes after treatment (to make sure it is greater than 70 mg/dL). If your blood sugar is not greater than 70 mg/dL on recheck, call [phone] for further instructions. Report your blood sugar to the short stay nurse when you get to Short Stay.  If you are admitted to the hospital after surgery: Your blood sugar will be checked by the staff and you will probably be given insulin after surgery (instead of oral diabetes medicines) to make sure you have good blood sugar levels. The goal for blood sugar control after surgery is 80-180 mg/dL.                              You may not have any metal on your body including hair pins, jewelry, and body piercing             Do not wear make-up, lotions, powders, perfumes, or  deodorant  Do not wear nail polish including gel and S&S, artificial/acrylic nails, or any other type of covering on natural nails including finger and toenails. If you have artificial nails, gel coating, etc. that needs to be removed by a nail salon please have this removed prior to surgery or surgery may need to be canceled/ delayed if the surgeon/ anesthesia feels like they are unable to be safely monitored.   Do not shave  48 hours prior to surgery.    Do not bring valuables to the hospital. [place].    Patients discharged on the day of surgery will not be allowed to drive home.  Someone needs to stay with you for the first 24 hours after anesthesia.   Special Instructions: Bring a copy of your healthcare power of attorney and living will documents         the day of surgery if you haven't scanned them before.              Please read over the following fact sheets you were given: IF YOU HAVE QUESTIONS ABOUT YOUR PRE-OP INSTRUCTIONS PLEASE CALL [phone]- Apolonio Schneiders  [place] - Preparing for Surgery Before surgery, you can play an important role.  Because skin is not sterile, your skin needs to be as free of germs as possible.  You can reduce the number of germs on your skin by washing with CHG (chlorahexidine gluconate) soap before surgery.  CHG is an antiseptic cleaner which kills germs and bonds with the skin to continue killing germs even after washing. Please DO NOT use if you have an allergy to CHG or antibacterial soaps.  If your skin becomes reddened/irritated stop using the CHG and inform your nurse when you arrive at Short Stay. Do not shave (including legs and underarms) for at least 48 hours prior to the first CHG shower.  You may shave your face/neck.  Please follow these instructions carefully:  1.  Shower with CHG Soap the night before surgery and the  morning of surgery.  2.  If you choose to wash your hair, wash your  hair first as usual with your normal  shampoo.  3.  After you shampoo, rinse your hair and body thoroughly to remove the shampoo.                             4.  Use CHG as you would any other liquid soap.  You can apply chg directly to the skin and wash.  Gently with a scrungie or clean washcloth.  5.  Apply the CHG Soap to your body ONLY FROM THE NECK DOWN.   Do   not use on face/ open                           Wound or open sores. Avoid contact with eyes, ears mouth and   genitals (private parts).                       Wash face,  Genitals (private parts) with your normal soap.             6.  Wash thoroughly, paying special attention to the area where your    surgery  will be performed.  7.  Thoroughly rinse your body with warm water from the neck down.  8.  DO NOT shower/wash with your normal soap after using and rinsing off the CHG Soap.                9.  Pat yourself dry with a clean towel.            10.  Wear clean pajamas.            11.  Place clean sheets on your bed the night of your first shower and do not  sleep with pets. Day of Surgery : Do not apply any lotions/deodorants the morning of surgery.  Please wear clean clothes to the hospital/surgery center.  FAILURE TO FOLLOW THESE INSTRUCTIONS MAY RESULT IN THE CANCELLATION OF YOUR SURGERY  PATIENT SIGNATURE_________________________________  NURSE SIGNATURE__________________________________  ________________________________________________________________________

## 2021-05-17 NOTE — Progress Notes (Addendum)
COVID swab appointment: n/a  COVID Vaccine Completed: n/a Date COVID Vaccine completed: Has received booster: COVID vaccine manufacturer: [employer]   Date of COVID positive in last 90 days: no  PCP - Billey Gosling, MD Cardiologist - n/a  Chest x-ray - n/a EKG - 03/06/21 Epic Stress Test - n/a ECHO - many years ago per pt Cardiac Cath - n/a Pacemaker/ICD device last checked: n/a Spinal Cord Stimulator: n/a  Sleep Study - yes, positive CPAP - not since surgery  Fasting Blood Sugar - 120-170 Checks Blood Sugar few times a week  Blood Thinner Instructions: n/a Aspirin Instructions: Last Dose:  Activity level: Can go up a flight of stairs and perform activities of daily living without stopping and without symptoms of chest pain or shortness of breath.       Anesthesia review: shaking after anesthesia for drain placement, DM 2, HTN, CAD  Patient denies shortness of breath, fever, cough and chest pain at PAT appointment   Patient verbalized understanding of instructions that were given to them at the PAT appointment. Patient was also instructed that they will need to review over the PAT instructions again at home before surgery.

## 2021-05-17 NOTE — Progress Notes (Deleted)
[place] Phone: [phone] Subjective:    I'm seeing this patient by the request  of:  Burns, Claudina Lick, MD  CC:   [id number]  Sandra [name] is a 61 y.o. female coming in with complaint of back and neck pain. OMT 04/10/2021. Patient states   Medications patient has been prescribed: Gabapentin  Taking:         Reviewed prior external information including notes and imaging from previsou exam, outside providers and external EMR if available.   As well as notes that were available from care everywhere and other healthcare systems.  Past medical history, social, surgical and family history all reviewed in electronic medical record.  No pertanent information unless stated regarding to the chief complaint.   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 HYSTERECTOMY   STAGE I ENDOMETROID CARCINOMA   Hx of adenomatous colonic polyps 2011   BENIGN   Hyperlipidemia    no meds now   Hypertension    Sleep apnea    no CPAP since surgery    Allergies  Allergen Reactions   Nsaids     r/t gastric surgery   Statins     Muscle aches     Review of Systems:  No headache, visual  changes, nausea, vomiting, diarrhea, constipation, dizziness, abdominal pain, skin rash, fevers, chills, night sweats, weight loss, swollen lymph nodes, body aches, joint swelling, chest pain, shortness of breath, mood changes. POSITIVE muscle aches  Objective  There were no vitals taken for this visit.   General: No apparent distress alert and oriented x3 mood and affect normal, dressed appropriately.  HEENT: Pupils equal, extraocular movements intact  Respiratory: Patient's speak in full sentences and does not appear short of breath  Cardiovascular: No lower extremity edema, non tender, no erythema  Neuro: Cranial nerves II through XII are intact, neurovascularly intact in all extremities with 2+ DTRs and 2+ pulses.  Gait normal with good balance and coordination.  MSK:  Non tender with full range of motion and good stability and symmetric strength and tone of shoulders, elbows, wrist, hip, knee and ankles bilaterally.  Back - Normal skin, Spine with normal alignment and no deformity.  No tenderness to vertebral process palpation.  Paraspinous muscles are not tender and without spasm.   Range of motion is full at neck and lumbar sacral regions  Osteopathic findings  C2 flexed rotated and side bent right C6 flexed rotated and side bent left T3 extended rotated and side bent right inhaled rib T9 extended rotated and side bent left L2 flexed  rotated and side bent right Sacrum right on right       Assessment and Plan:    Nonallopathic problems  Decision today to treat with OMT was based on Physical Exam  After verbal consent patient was treated with HVLA, ME, FPR techniques in cervical, rib, thoracic, lumbar, and sacral  areas  Patient tolerated the procedure well with improvement in symptoms  Patient given exercises, stretches and lifestyle modifications  See medications in patient instructions if given  Patient will follow up in 4-8 weeks      The above documentation  has been reviewed and is accurate and complete Jacqualin Combes       Note: This dictation was prepared with Dragon dictation along with smaller phrase technology. Any transcriptional errors that result from this process are unintentional.

## 2021-05-18 ENCOUNTER — Encounter (HOSPITAL_COMMUNITY): Payer: Self-pay

## 2021-05-18 ENCOUNTER — Encounter (HOSPITAL_COMMUNITY)
Admission: RE | Admit: 2021-05-18 | Discharge: 2021-05-18 | Disposition: A | Discharge status: Home / Self Care | Payer: 59 | Source: Ambulatory Visit | Attending: Surgery | Admitting: Surgery

## 2021-05-18 ENCOUNTER — Other Ambulatory Visit: Payer: Self-pay

## 2021-05-18 VITALS — HR 75 | Temp 99.5°F | Resp 14 | Ht 62.0 in | Wt 136.0 lb

## 2021-05-18 DIAGNOSIS — E119 Type 2 diabetes mellitus without complications: Secondary | ICD-10-CM | POA: Insufficient documentation

## 2021-05-18 DIAGNOSIS — I2510 Atherosclerotic heart disease of native coronary artery without angina pectoris: Secondary | ICD-10-CM | POA: Diagnosis not present

## 2021-05-18 DIAGNOSIS — Z01812 Encounter for preprocedural laboratory examination: Secondary | ICD-10-CM | POA: Insufficient documentation

## 2021-05-18 HISTORY — DX: Pneumonia, unspecified organism: J18.9

## 2021-05-18 HISTORY — DX: Personal history of urinary calculi: Z87.442

## 2021-05-18 LAB — CBC
HCT: 43.1 % (ref 36.0–46.0)
Hemoglobin: 13.8 g/dL (ref 12.0–15.0)
MCH: 30.7 pg (ref 26.0–34.0)
MCHC: 32 g/dL (ref 30.0–36.0)
MCV: 96 fL (ref 80.0–100.0)
Platelets: 326 10*3/uL (ref 150–400)
RBC: 4.49 MIL/uL (ref 3.87–5.11)
RDW: 13.4 % (ref 11.5–15.5)
WBC: 9.6 10*3/uL (ref 4.0–10.5)
nRBC: 0 % (ref 0.0–0.2)

## 2021-05-18 LAB — BASIC METABOLIC PANEL
Anion gap: 7 (ref 5–15)
BUN: 30 mg/dL — ABNORMAL HIGH (ref 8–23)
CO2: 29 mmol/L (ref 22–32)
Calcium: 9.4 mg/dL (ref 8.9–10.3)
Chloride: 103 mmol/L (ref 98–111)
Creatinine, Ser: 0.54 mg/dL (ref 0.44–1.00)
GFR, Estimated: >60 mL/min (ref >60)
Glucose, Bld: 124 mg/dL — ABNORMAL HIGH (ref 70–99)
Potassium: 4.2 mmol/L (ref 3.5–5.1)
Sodium: 139 mmol/L (ref 135–145)

## 2021-05-18 LAB — GLUCOSE, CAPILLARY: Glucose-Capillary: 143 mg/dL — ABNORMAL HIGH (ref 70–99)

## 2021-05-19 LAB — HEMOGLOBIN A1C
Hgb A1c MFr Bld: 5.8 % — ABNORMAL HIGH (ref 4.8–5.6)
Mean Plasma Glucose: 120 mg/dL

## 2021-05-22 ENCOUNTER — Ambulatory Visit: Payer: 59 | Admitting: Family Medicine

## 2021-05-28 NOTE — Progress Notes (Signed)
Anesthesia Chart Review   Case: [id number] Date/Time: 05/31/21 1115   Procedures:      LAPAROSCOPIC CHOLECYSTECTOMY WITH INTRAOPERATIVE CHOLANGIOGRAM     POSSIBLE NEEDLE CORE BIOPSY OF LIVER   Anesthesia type: General   Pre-op diagnosis: symptomatic biliary colic   Location[facility] ROOM 01 / [facility]   Surgeons: Michael Boston, MD       DISCUSSION:61 y.o. never smoker with h/p GERD, sleep apnea, nonobstructive CAD, s/p gastric bypass, symptomatic biliary colic scheduled for above procedure 05/31/21 with Dr. Michael Boston.   Per anesthesia note 09/27/2014, "hx of difficult airway but no records of that event, prior intubation here at [facility] long demonstrates "easy mask with oral airway" "unable to visulize vocal cords with MAC 4" "grade 1 view with glidescope"  Per anesthesia note 10/23/2017, "Positive BMV.  DL x1 with Mil 2.  Grade 3 view.  Decision made to use glidescope. Positive BMV in between DL attempts. DL x2 with glidescope 4.  EBBS and VSS."  Anticipate pt can proceed with planned procedure barring acute status change.   VS: Pulse 75    Temp 37.5 C (Oral)    Resp 14    Ht _0  (1.575 m)    Wt 61.7 kg    SpO2 99%    BMI 24.87 kg/m   PROVIDERS: [name], MD is PCP    LABS: Labs reviewed: Acceptable for surgery. (all labs ordered are listed, but only abnormal results are displayed)  Labs Reviewed  HEMOGLOBIN A1C - Abnormal; Notable for the following components:      Result Value   Hgb A1c MFr Bld 5.8 (*)    All other components within normal limits  BASIC METABOLIC PANEL - Abnormal; Notable for the following components:   Glucose, Bld 124 (*)    BUN 30 (*)    All other components within normal limits  GLUCOSE, CAPILLARY - Abnormal; Notable for the following components:   Glucose-Capillary 143 (*)    All other components within normal limits  CBC     IMAGES:   EKG: 03/06/2021 Rate 88 bpm  Sinus rhythm  Anteroseptal infarct, old  CV:  Past Medical History:   Diagnosis Date   Anxiety    Arthritis    CAD (coronary artery disease)    nonobstructive CAD in LAD per 2013 CTA coronary morph   Cholelithiasis 04/06/2021   Depression    Diabetes mellitus    no meds taken now, checks cbg once or twice a week    Difficult intubation    had to use the glidescope due to small airway    Gallbladder abscess 05/02/2021   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 HYSTERECTOMY   STAGE I ENDOMETROID CARCINOMA   History of kidney stones    Hx of adenomatous colonic polyps 2011   BENIGN   Hyperlipidemia    no meds now   Hypertension    Pneumonia    Sleep apnea    no CPAP since surgery    Past Surgical History:  Procedure Laterality Date   ABDOMINAL HYSTERECTOMY  12-20-2004  [facility]   BILATERAL BREAST REDUCTION  11-16-1999   BIOPSY  02/20/2021   Procedure: BIOPSY;  Surgeon: Daryel November, MD;  Location[facility] ENDOSCOPY;  Service: Gastroenterology;;   Rosanne Gutting PYLORI N/A 05/27/2013   Procedure: Lauris Chroman;  Surgeon: Shann Medal, MD;  Location: [facility]OS[facility];  Service: General;  Laterality: N/A;   [place] N/A 04/25/2014   Procedure: COLONOSCOPY;  Surgeon: Gatha Mayer, MD;  [facility];  Service: Endoscopy;  Laterality: N/A;   COLONOSCOPY WITH PROPOFOL N/A 07/26/2019   Procedure: COLONOSCOPY WITH PROPOFOL;  Surgeon: Gatha Mayer, MD;  [facility];  Service: Endoscopy;  Laterality: N/A;   CYSTOSCOPY  09/06/2011   Procedure: CYSTOSCOPY;  Surgeon: Ailene Rud, MD;  Location: [facility];  Service:  Urology;  Laterality: N/A;  LYNX SLING    ESOPHAGOGASTRODUODENOSCOPY (EGD) WITH PROPOFOL N/A 09/27/2014   Procedure: ESOPHAGOGASTRODUODENOSCOPY (EGD) WITH PROPOFOL;  Surgeon: Gatha Mayer, MD;  [facility];  Service: Endoscopy;  Laterality: N/A;   ESOPHAGOGASTRODUODENOSCOPY (EGD) WITH PROPOFOL N/A 03/11/2016   Procedure: ESOPHAGOGASTRODUODENOSCOPY (EGD) WITH PROPOFOL;  Surgeon: Jerene Bears, MD;  [facility];  Service: Endoscopy;  Laterality: N/A;   ESOPHAGOGASTRODUODENOSCOPY (EGD) WITH PROPOFOL Left 02/20/2021   Procedure: ESOPHAGOGASTRODUODENOSCOPY (EGD) WITH PROPOFOL;  Surgeon: Daryel November, MD;  Location: [place];  Service: Gastroenterology;  Laterality: Left;   GASTRIC BYPASS  10/04/2013   GASTRIC ROUX-EN-Y N/A 10/04/2013   Procedure: LAPAROSCOPIC ROUX-EN-Y GASTRIC BYPASS WITH UPPER ENDOSCOPY;  Surgeon: Shann Medal, MD;  [facility];  Service: General;  Laterality: N/A;   HEMORRHOID BANDING  02/2013   IR CHOLANGIOGRAM EXISTING TUBE  05/01/2021   IR PERC CHOLECYSTOSTOMY  03/06/2021   IR RADIOLOGIST EVAL & MGMT  04/12/2021   POLYPECTOMY     POLYPECTOMY  07/26/2019   Procedure: POLYPECTOMY;  Surgeon: Gatha Mayer, MD;  [facility];  Service: Endoscopy;;   PUBOVAGINAL SLING  09/06/2011   Procedure: Gaynelle Arabian;  Surgeon: Ailene Rud, MD;  Location: [facility];  Service: Urology;  Laterality: N/A;   REDUCTION MAMMAPLASTY Bilateral 2001   SHOULDER ARTHROSCOPY Right 10/23/2017   Procedure: RIGHT SHOULDER ARTHROSCOPIC ROTATOR CUFF REPAIR VS. DEBRIDEMENT, SUBACROMIAL DECOMPARESSION, DISTAL CLAVICAL EXCISION;  Surgeon: Tania Ade, MD;  Location: [place];  Service: Orthopedics;  Laterality: Right;   UVULOPALATOPHARYNGOPLASTY, TONSILLECTOMY AND SEPTOPLASTY  1994    MEDICATIONS:  acetaminophen (TYLENOL) 500 MG tablet   Blood Glucose Monitoring Suppl (FREESTYLE LITE) w/Device KIT   COLLAGEN PO   gabapentin  (NEURONTIN) 300 MG capsule   glucose blood (FREESTYLE LITE) test strip   glucose blood (FREESTYLE LITE) test strip   Lancets (FREESTYLE) lancets   Multiple Vitamins-Minerals (MULTIVITAMIN PO)   omeprazole (PRILOSEC) 40 MG capsule   ondansetron (ZOFRAN-ODT) 4 MG disintegrating tablet   polyethylene glycol powder (MIRALAX) 17 GM/SCOOP powder   Sodium Chloride Flush (NORMAL SALINE FLUSH) 0.9 % SOLN   No current facility-administered medications for this encounter.    Konrad Felix Ward, PA-C [facility] [phone]

## 2021-05-30 NOTE — Anesthesia Preprocedure Evaluation (Addendum)
Anesthesia Evaluation  Patient identified by MRN, date of birth, ID band Patient awake    Reviewed: Allergy & Precautions, NPO status , Patient's Chart, lab work & pertinent test results  History of Anesthesia Complications (+) DIFFICULT AIRWAY  Airway Mallampati: III  TM Distance: >3 FB Neck ROM: Full  Mouth opening: Limited Mouth Opening  Dental no notable dental hx.    Pulmonary    Pulmonary exam normal breath sounds clear to auscultation       Cardiovascular Normal cardiovascular exam Rhythm:Regular Rate:Normal     Neuro/Psych Anxiety Depression    GI/Hepatic GERD  ,  Endo/Other    Renal/GU      Musculoskeletal  (+) Arthritis ,   Abdominal   Peds  Hematology Lab Results      Component                Value               Date                      WBC                      9.6                 05/18/2021                HGB                      13.8                05/18/2021                HCT                      43.1                05/18/2021                MCV                      96.0                05/18/2021                PLT                      326                 05/18/2021              Anesthesia Other Findings All : Statins, NSAIDS  Reproductive/Obstetrics                            Anesthesia Physical Anesthesia Plan  ASA: 2  Anesthesia Plan: General   Post-op Pain Management:    Induction: Intravenous  PONV Risk Score and Plan: Treatment may vary due to age or medical condition, Midazolam, Ondansetron and Dexamethasone  Airway Management Planned: Video Laryngoscope Planned and Oral ETT  Additional Equipment: None  Intra-op Plan:   Post-operative Plan: Extubation in OR  Informed Consent: I have reviewed the patients History and Physical, chart, labs and discussed the procedure including the risks, benefits and alternatives for the proposed anesthesia with the  patient or authorized representative who has indicated his/her understanding and acceptance.  Dental advisory given  Plan Discussed with:   Anesthesia Plan Comments: (Pt w Diff Airway. GA )       Anesthesia Quick Evaluation

## 2021-05-31 ENCOUNTER — Other Ambulatory Visit: Payer: Self-pay

## 2021-05-31 ENCOUNTER — Encounter (HOSPITAL_COMMUNITY): Payer: Self-pay | Admitting: Surgery

## 2021-05-31 ENCOUNTER — Ambulatory Visit (HOSPITAL_COMMUNITY): Payer: 59 | Admitting: Physician Assistant

## 2021-05-31 ENCOUNTER — Encounter (HOSPITAL_COMMUNITY)
Admission: AD | Disposition: A | Discharge status: Home / Self Care | Payer: Self-pay | Source: Ambulatory Visit | Attending: Surgery

## 2021-05-31 ENCOUNTER — Other Ambulatory Visit (HOSPITAL_COMMUNITY): Payer: Self-pay

## 2021-05-31 ENCOUNTER — Ambulatory Visit (HOSPITAL_COMMUNITY): Payer: 59 | Admitting: Certified Registered"

## 2021-05-31 ENCOUNTER — Inpatient Hospital Stay (HOSPITAL_COMMUNITY)
Admission: AD | Admit: 2021-05-31 | Discharge: 2021-06-05 | DRG: 418 | Disposition: A | Discharge status: Home / Self Care | Payer: 59 | Source: Ambulatory Visit | Attending: Surgery | Admitting: Surgery

## 2021-05-31 DIAGNOSIS — K82A2 Perforation of gallbladder in cholecystitis: Secondary | ICD-10-CM | POA: Diagnosis present

## 2021-05-31 DIAGNOSIS — I10 Essential (primary) hypertension: Secondary | ICD-10-CM | POA: Diagnosis present

## 2021-05-31 DIAGNOSIS — K7581 Nonalcoholic steatohepatitis (NASH): Secondary | ICD-10-CM | POA: Diagnosis not present

## 2021-05-31 DIAGNOSIS — K8010 Calculus of gallbladder with chronic cholecystitis without obstruction: Secondary | ICD-10-CM | POA: Diagnosis not present

## 2021-05-31 DIAGNOSIS — L02211 Cutaneous abscess of abdominal wall: Secondary | ICD-10-CM | POA: Diagnosis not present

## 2021-05-31 DIAGNOSIS — M1990 Unspecified osteoarthritis, unspecified site: Secondary | ICD-10-CM | POA: Diagnosis present

## 2021-05-31 DIAGNOSIS — Z9884 Bariatric surgery status: Secondary | ICD-10-CM

## 2021-05-31 DIAGNOSIS — E876 Hypokalemia: Secondary | ICD-10-CM | POA: Diagnosis present

## 2021-05-31 DIAGNOSIS — E440 Moderate protein-calorie malnutrition: Secondary | ICD-10-CM | POA: Diagnosis present

## 2021-05-31 DIAGNOSIS — Z860100 Personal history of colon polyps, unspecified: Secondary | ICD-10-CM

## 2021-05-31 DIAGNOSIS — Z90711 Acquired absence of uterus with remaining cervical stump: Secondary | ICD-10-CM | POA: Diagnosis not present

## 2021-05-31 DIAGNOSIS — Z79899 Other long term (current) drug therapy: Secondary | ICD-10-CM

## 2021-05-31 DIAGNOSIS — B9620 Unspecified Escherichia coli [E. coli] as the cause of diseases classified elsewhere: Secondary | ICD-10-CM | POA: Diagnosis not present

## 2021-05-31 DIAGNOSIS — E119 Type 2 diabetes mellitus without complications: Secondary | ICD-10-CM

## 2021-05-31 DIAGNOSIS — K5909 Other constipation: Secondary | ICD-10-CM | POA: Diagnosis present

## 2021-05-31 DIAGNOSIS — K8012 Calculus of gallbladder with acute and chronic cholecystitis without obstruction: Secondary | ICD-10-CM | POA: Diagnosis not present

## 2021-05-31 DIAGNOSIS — Z888 Allergy status to other drugs, medicaments and biological substances status: Secondary | ICD-10-CM

## 2021-05-31 DIAGNOSIS — Z886 Allergy status to analgesic agent status: Secondary | ICD-10-CM

## 2021-05-31 DIAGNOSIS — K219 Gastro-esophageal reflux disease without esophagitis: Secondary | ICD-10-CM | POA: Diagnosis not present

## 2021-05-31 DIAGNOSIS — K660 Peritoneal adhesions (postprocedural) (postinfection): Secondary | ICD-10-CM | POA: Diagnosis present

## 2021-05-31 DIAGNOSIS — K760 Fatty (change of) liver, not elsewhere classified: Secondary | ICD-10-CM | POA: Diagnosis not present

## 2021-05-31 DIAGNOSIS — R1011 Right upper quadrant pain: Secondary | ICD-10-CM | POA: Diagnosis not present

## 2021-05-31 DIAGNOSIS — K839 Disease of biliary tract, unspecified: Secondary | ICD-10-CM | POA: Diagnosis not present

## 2021-05-31 DIAGNOSIS — F419 Anxiety disorder, unspecified: Secondary | ICD-10-CM | POA: Diagnosis present

## 2021-05-31 DIAGNOSIS — K812 Acute cholecystitis with chronic cholecystitis: Secondary | ICD-10-CM | POA: Diagnosis not present

## 2021-05-31 DIAGNOSIS — I2510 Atherosclerotic heart disease of native coronary artery without angina pectoris: Secondary | ICD-10-CM | POA: Diagnosis present

## 2021-05-31 DIAGNOSIS — F418 Other specified anxiety disorders: Secondary | ICD-10-CM | POA: Diagnosis not present

## 2021-05-31 DIAGNOSIS — K8066 Calculus of gallbladder and bile duct with acute and chronic cholecystitis without obstruction: Secondary | ICD-10-CM | POA: Diagnosis not present

## 2021-05-31 DIAGNOSIS — E7800 Pure hypercholesterolemia, unspecified: Secondary | ICD-10-CM | POA: Diagnosis present

## 2021-05-31 HISTORY — PX: CHOLECYSTECTOMY: SHX55

## 2021-05-31 LAB — GLUCOSE, CAPILLARY
Glucose-Capillary: 210 mg/dL — ABNORMAL HIGH (ref 70–99)
Glucose-Capillary: 200 mg/dL — ABNORMAL HIGH (ref 70–99)

## 2021-05-31 SURGERY — LAPAROSCOPIC CHOLECYSTECTOMY
Anesthesia: General | Site: Abdomen

## 2021-05-31 MED ORDER — DIPHENHYDRAMINE HCL 50 MG/ML IJ SOLN: 12.5000 mg | Freq: Four times a day (QID) | INTRAMUSCULAR | Status: DC | PRN

## 2021-05-31 MED ORDER — CALCIUM POLYCARBOPHIL 625 MG PO TABS
625.0000 mg | ORAL_TABLET | Freq: Two times a day (BID) | ORAL | Status: DC
  Administered 2021-05-31 – 2021-06-01 (×2): 625 mg via ORAL
  Filled 2021-05-31 (×4): qty 1

## 2021-05-31 MED ORDER — OXYCODONE HCL 5 MG/5ML PO SOLN: 5.0000 mg | Freq: Once | ORAL | Status: AC | PRN

## 2021-05-31 MED ORDER — CHLORHEXIDINE GLUCONATE CLOTH 2 % EX PADS: 6.0000 | MEDICATED_PAD | Freq: Once | CUTANEOUS | Status: DC

## 2021-05-31 MED ORDER — DEXMEDETOMIDINE (PRECEDEX) IN NS 20 MCG/5ML (4 MCG/ML) IV SYRINGE
PREFILLED_SYRINGE | INTRAVENOUS | Status: DC | PRN
  Administered 2021-05-31: 12 ug via INTRAVENOUS
  Administered 2021-05-31 (×2): 4 ug via INTRAVENOUS

## 2021-05-31 MED ORDER — METHOCARBAMOL 500 MG PO TABS
500.0000 mg | ORAL_TABLET | Freq: Four times a day (QID) | ORAL | Status: DC | PRN
  Administered 2021-05-31 – 2021-06-01 (×2): 500 mg via ORAL
  Filled 2021-05-31 (×2): qty 1

## 2021-05-31 MED ORDER — GABAPENTIN 300 MG PO CAPS
300.0000 mg | ORAL_CAPSULE | ORAL | Status: AC
  Administered 2021-05-31: 300 mg via ORAL
  Filled 2021-05-31: qty 1

## 2021-05-31 MED ORDER — ENALAPRILAT 1.25 MG/ML IV SOLN
0.6250 mg | Freq: Four times a day (QID) | INTRAVENOUS | Status: DC | PRN
  Filled 2021-05-31: qty 1

## 2021-05-31 MED ORDER — MIDAZOLAM HCL 2 MG/2ML IJ SOLN
INTRAMUSCULAR | Status: AC
  Filled 2021-05-31: qty 2

## 2021-05-31 MED ORDER — LABETALOL HCL 5 MG/ML IV SOLN
INTRAVENOUS | Status: AC
  Filled 2021-05-31: qty 4

## 2021-05-31 MED ORDER — ACETAMINOPHEN 500 MG PO TABS
1000.0000 mg | ORAL_TABLET | ORAL | Status: AC
  Administered 2021-05-31: 1000 mg via ORAL
  Filled 2021-05-31: qty 2

## 2021-05-31 MED ORDER — LACTATED RINGERS IV BOLUS: 1000.0000 mL | Freq: Three times a day (TID) | INTRAVENOUS | Status: DC | PRN

## 2021-05-31 MED ORDER — ORAL CARE MOUTH RINSE
15.0000 mL | Freq: Once | OROMUCOSAL | Status: AC
  Administered 2021-05-31: 15 mL via OROMUCOSAL

## 2021-05-31 MED ORDER — ROCURONIUM BROMIDE 10 MG/ML (PF) SYRINGE
PREFILLED_SYRINGE | INTRAVENOUS | Status: AC
  Filled 2021-05-31: qty 10

## 2021-05-31 MED ORDER — OXYCODONE HCL 5 MG PO TABS
ORAL_TABLET | ORAL | Status: AC
  Administered 2021-05-31: 5 mg via ORAL
  Filled 2021-05-31: qty 1

## 2021-05-31 MED ORDER — FENTANYL CITRATE (PF) 100 MCG/2ML IJ SOLN
INTRAMUSCULAR | Status: AC
  Filled 2021-05-31: qty 2

## 2021-05-31 MED ORDER — METHOCARBAMOL 500 MG PO TABS: 1000.0000 mg | ORAL_TABLET | Freq: Four times a day (QID) | ORAL | Status: DC | PRN

## 2021-05-31 MED ORDER — HYDROMORPHONE HCL 1 MG/ML IJ SOLN
0.2500 mg | INTRAMUSCULAR | Status: DC | PRN
  Administered 2021-05-31 (×2): 0.5 mg via INTRAVENOUS

## 2021-05-31 MED ORDER — PHENYLEPHRINE HCL-NACL 20-0.9 MG/250ML-% IV SOLN
INTRAVENOUS | Status: DC | PRN
  Administered 2021-05-31: 20 ug/min via INTRAVENOUS

## 2021-05-31 MED ORDER — CHLORHEXIDINE GLUCONATE 0.12 % MT SOLN: 15.0000 mL | Freq: Once | OROMUCOSAL | Status: AC

## 2021-05-31 MED ORDER — ESMOLOL HCL 100 MG/10ML IV SOLN
INTRAVENOUS | Status: AC
  Filled 2021-05-31: qty 10

## 2021-05-31 MED ORDER — PROPOFOL 10 MG/ML IV BOLUS
INTRAVENOUS | Status: AC
  Filled 2021-05-31: qty 20

## 2021-05-31 MED ORDER — LACTATED RINGERS IV SOLN: INTRAVENOUS | Status: DC

## 2021-05-31 MED ORDER — SODIUM CHLORIDE 0.9 % IV SOLN: Freq: Three times a day (TID) | INTRAVENOUS | Status: DC | PRN

## 2021-05-31 MED ORDER — BUPIVACAINE-EPINEPHRINE 0.25% -1:200000 IJ SOLN
INTRAMUSCULAR | Status: DC | PRN
  Administered 2021-05-31: 50 mL

## 2021-05-31 MED ORDER — LIP MEDEX EX OINT
1.0000 "application " | TOPICAL_OINTMENT | Freq: Two times a day (BID) | CUTANEOUS | Status: DC
  Administered 2021-05-31 – 2021-06-05 (×8): 1 via TOPICAL
  Filled 2021-05-31 (×2): qty 7

## 2021-05-31 MED ORDER — GABAPENTIN 300 MG PO CAPS
300.0000 mg | ORAL_CAPSULE | Freq: Two times a day (BID) | ORAL | Status: DC
  Administered 2021-05-31 – 2021-06-01 (×2): 300 mg via ORAL
  Filled 2021-05-31 (×2): qty 1

## 2021-05-31 MED ORDER — EPHEDRINE SULFATE-NACL 50-0.9 MG/10ML-% IV SOSY
PREFILLED_SYRINGE | INTRAVENOUS | Status: DC | PRN
  Administered 2021-05-31: 10 mg via INTRAVENOUS
  Administered 2021-05-31: 5 mg via INTRAVENOUS

## 2021-05-31 MED ORDER — PANTOPRAZOLE SODIUM 40 MG PO TBEC
40.0000 mg | DELAYED_RELEASE_TABLET | Freq: Every day | ORAL | Status: DC
  Administered 2021-05-31 – 2021-06-05 (×6): 40 mg via ORAL
  Filled 2021-05-31 (×6): qty 1

## 2021-05-31 MED ORDER — SODIUM CHLORIDE 0.9 % IV SOLN
2.0000 g | INTRAVENOUS | Status: AC
  Administered 2021-05-31: 2 g via INTRAVENOUS
  Filled 2021-05-31: qty 20

## 2021-05-31 MED ORDER — AMISULPRIDE (ANTIEMETIC) 5 MG/2ML IV SOLN: 10.0000 mg | Freq: Once | INTRAVENOUS | Status: DC | PRN

## 2021-05-31 MED ORDER — ENSURE PRE-SURGERY PO LIQD: 296.0000 mL | Freq: Once | ORAL | Status: DC

## 2021-05-31 MED ORDER — FENTANYL CITRATE (PF) 250 MCG/5ML IJ SOLN
INTRAMUSCULAR | Status: DC | PRN
  Administered 2021-05-31 (×4): 50 ug via INTRAVENOUS

## 2021-05-31 MED ORDER — LABETALOL HCL 5 MG/ML IV SOLN
INTRAVENOUS | Status: DC | PRN
  Administered 2021-05-31 (×2): 2.5 mg via INTRAVENOUS

## 2021-05-31 MED ORDER — ONDANSETRON HCL 4 MG/2ML IJ SOLN
INTRAMUSCULAR | Status: DC | PRN
  Administered 2021-05-31: 4 mg via INTRAVENOUS

## 2021-05-31 MED ORDER — HYDROMORPHONE HCL 1 MG/ML IJ SOLN
INTRAMUSCULAR | Status: AC
  Administered 2021-05-31: 0.5 mg via INTRAVENOUS
  Filled 2021-05-31: qty 1

## 2021-05-31 MED ORDER — METRONIDAZOLE 500 MG/100ML IV SOLN
500.0000 mg | INTRAVENOUS | Status: AC
  Administered 2021-05-31: 500 mg via INTRAVENOUS
  Filled 2021-05-31: qty 100

## 2021-05-31 MED ORDER — OXYCODONE HCL 5 MG PO TABS: 5.0000 mg | ORAL_TABLET | Freq: Once | ORAL | Status: AC | PRN

## 2021-05-31 MED ORDER — ACETAMINOPHEN 10 MG/ML IV SOLN: 1000.0000 mg | Freq: Once | INTRAVENOUS | Status: DC | PRN

## 2021-05-31 MED ORDER — MAGIC MOUTHWASH
15.0000 mL | Freq: Four times a day (QID) | ORAL | Status: DC | PRN
  Filled 2021-05-31: qty 15

## 2021-05-31 MED ORDER — BUPIVACAINE LIPOSOME 1.3 % IJ SUSP: 20.0000 mL | Freq: Once | INTRAMUSCULAR | Status: DC

## 2021-05-31 MED ORDER — LACTATED RINGERS IR SOLN
Status: DC | PRN
  Administered 2021-05-31: 6000 mL

## 2021-05-31 MED ORDER — EPHEDRINE 5 MG/ML INJ
INTRAVENOUS | Status: AC
  Filled 2021-05-31: qty 5

## 2021-05-31 MED ORDER — METHOCARBAMOL 1000 MG/10ML IJ SOLN
1000.0000 mg | Freq: Four times a day (QID) | INTRAVENOUS | Status: DC | PRN
  Filled 2021-05-31: qty 10

## 2021-05-31 MED ORDER — DEXAMETHASONE SODIUM PHOSPHATE 10 MG/ML IJ SOLN
INTRAMUSCULAR | Status: DC | PRN
  Administered 2021-05-31: 4 mg via INTRAVENOUS

## 2021-05-31 MED ORDER — MAGNESIUM HYDROXIDE 400 MG/5ML PO SUSP: 30.0000 mL | Freq: Every day | ORAL | Status: DC | PRN

## 2021-05-31 MED ORDER — DEXAMETHASONE SODIUM PHOSPHATE 10 MG/ML IJ SOLN
INTRAMUSCULAR | Status: AC
  Filled 2021-05-31: qty 1

## 2021-05-31 MED ORDER — PHENYLEPHRINE 40 MCG/ML (10ML) SYRINGE FOR IV PUSH (FOR BLOOD PRESSURE SUPPORT)
PREFILLED_SYRINGE | INTRAVENOUS | Status: DC | PRN
  Administered 2021-05-31: 40 ug via INTRAVENOUS
  Administered 2021-05-31: 60 ug via INTRAVENOUS
  Administered 2021-05-31: 40 ug via INTRAVENOUS
  Administered 2021-05-31: 60 ug via INTRAVENOUS
  Administered 2021-05-31: 80 ug via INTRAVENOUS

## 2021-05-31 MED ORDER — ONDANSETRON HCL 4 MG/2ML IJ SOLN
INTRAMUSCULAR | Status: AC
  Filled 2021-05-31: qty 2

## 2021-05-31 MED ORDER — BISACODYL 10 MG RE SUPP: 10.0000 mg | Freq: Every day | RECTAL | Status: DC | PRN

## 2021-05-31 MED ORDER — TRAMADOL HCL 50 MG PO TABS
50.0000 mg | ORAL_TABLET | Freq: Four times a day (QID) | ORAL | 0 refills | Status: DC | PRN
  Filled 2021-05-31: qty 20, 3d supply, fill #0

## 2021-05-31 MED ORDER — MIDAZOLAM HCL 2 MG/2ML IJ SOLN
INTRAMUSCULAR | Status: DC | PRN
  Administered 2021-05-31: 2 mg via INTRAVENOUS

## 2021-05-31 MED ORDER — SODIUM CHLORIDE 0.9 % IR SOLN
Status: DC | PRN
  Administered 2021-05-31: 1000 mL

## 2021-05-31 MED ORDER — PROPOFOL 10 MG/ML IV BOLUS
INTRAVENOUS | Status: DC | PRN
Start: 2021-05-31 — End: 2021-05-31
  Administered 2021-05-31: 60 mg via INTRAVENOUS
  Administered 2021-05-31: 120 mg via INTRAVENOUS

## 2021-05-31 MED ORDER — ACETAMINOPHEN 10 MG/ML IV SOLN
INTRAVENOUS | Status: AC
  Administered 2021-05-31: 1000 mg via INTRAVENOUS
  Filled 2021-05-31: qty 100

## 2021-05-31 MED ORDER — PHENYLEPHRINE 40 MCG/ML (10ML) SYRINGE FOR IV PUSH (FOR BLOOD PRESSURE SUPPORT)
PREFILLED_SYRINGE | INTRAVENOUS | Status: AC
  Filled 2021-05-31: qty 10

## 2021-05-31 MED ORDER — METOPROLOL TARTRATE 5 MG/5ML IV SOLN: 5.0000 mg | Freq: Four times a day (QID) | INTRAVENOUS | Status: DC | PRN

## 2021-05-31 MED ORDER — SIMETHICONE 80 MG PO CHEW: 40.0000 mg | CHEWABLE_TABLET | Freq: Four times a day (QID) | ORAL | Status: DC | PRN

## 2021-05-31 MED ORDER — BUPIVACAINE LIPOSOME 1.3 % IJ SUSP
INTRAMUSCULAR | Status: DC | PRN
  Administered 2021-05-31: 20 mL

## 2021-05-31 MED ORDER — DIPHENHYDRAMINE HCL 12.5 MG/5ML PO ELIX: 12.5000 mg | ORAL_SOLUTION | Freq: Four times a day (QID) | ORAL | Status: DC | PRN

## 2021-05-31 MED ORDER — ONDANSETRON HCL 4 MG/2ML IJ SOLN
4.0000 mg | Freq: Once | INTRAMUSCULAR | Status: AC | PRN
  Administered 2021-05-31: 4 mg via INTRAVENOUS

## 2021-05-31 MED ORDER — SUGAMMADEX SODIUM 200 MG/2ML IV SOLN
INTRAVENOUS | Status: DC | PRN
  Administered 2021-05-31: 120 mg via INTRAVENOUS

## 2021-05-31 MED ORDER — ESMOLOL HCL 100 MG/10ML IV SOLN
INTRAVENOUS | Status: DC | PRN
  Administered 2021-05-31 (×2): 20 mg via INTRAVENOUS

## 2021-05-31 MED ORDER — ACETAMINOPHEN 500 MG PO TABS
1000.0000 mg | ORAL_TABLET | Freq: Four times a day (QID) | ORAL | Status: DC
  Administered 2021-05-31 – 2021-06-05 (×16): 1000 mg via ORAL
  Filled 2021-05-31 (×17): qty 2

## 2021-05-31 MED ORDER — ROCURONIUM BROMIDE 10 MG/ML (PF) SYRINGE
PREFILLED_SYRINGE | INTRAVENOUS | Status: DC | PRN
  Administered 2021-05-31: 50 mg via INTRAVENOUS
  Administered 2021-05-31 (×2): 10 mg via INTRAVENOUS
  Administered 2021-05-31: 20 mg via INTRAVENOUS

## 2021-05-31 MED ORDER — LIDOCAINE 2% (20 MG/ML) 5 ML SYRINGE
INTRAMUSCULAR | Status: DC | PRN
  Administered 2021-05-31: 40 mg via INTRAVENOUS

## 2021-05-31 MED ORDER — TRAMADOL HCL 50 MG PO TABS
50.0000 mg | ORAL_TABLET | Freq: Four times a day (QID) | ORAL | Status: DC | PRN
  Administered 2021-05-31 – 2021-06-01 (×2): 50 mg via ORAL
  Filled 2021-05-31 (×2): qty 1

## 2021-05-31 SURGICAL SUPPLY — 46 items
APPLIER CLIP 5 13 M/L LIGAMAX5 (MISCELLANEOUS)
APPLIER CLIP ROT 10 11.4 M/L (STAPLE)
BAG COUNTER SPONGE SURGICOUNT (BAG) ×3 IMPLANT
CABLE HIGH FREQUENCY MONO STRZ (ELECTRODE) IMPLANT
CLIP APPLIE 5 13 M/L LIGAMAX5 (MISCELLANEOUS) IMPLANT
CLIP APPLIE ROT 10 11.4 M/L (STAPLE) IMPLANT
COVER SURGICAL LIGHT HANDLE (MISCELLANEOUS) ×3 IMPLANT
DRAPE 3/4 80X56 (DRAPES) ×3 IMPLANT
DRAPE C-ARM 42X120 X-RAY (DRAPES) ×3 IMPLANT
DRAPE UTILITY XL STRL (DRAPES) ×3 IMPLANT
DRAPE WARM FLUID 44X44 (DRAPES) ×3 IMPLANT
DRSG TEGADERM 2-3/8X2-3/4 SM (GAUZE/BANDAGES/DRESSINGS) ×6 IMPLANT
DRSG TEGADERM 4X4.75 (GAUZE/BANDAGES/DRESSINGS) ×2 IMPLANT
DRSG TEGADERM 6X8 (GAUZE/BANDAGES/DRESSINGS) ×3 IMPLANT
ELECT REM PT RETURN 15FT ADLT (MISCELLANEOUS) ×3 IMPLANT
ENDOLOOP SUT PDS II  0 18 (SUTURE)
ENDOLOOP SUT PDS II 0 18 (SUTURE) IMPLANT
GAUZE SPONGE 2X2 8PLY STRL LF (GAUZE/BANDAGES/DRESSINGS) ×1 IMPLANT
GLOVE SURG NEOPR MICRO LF SZ8 (GLOVE) ×3 IMPLANT
GLOVE SURG UNDER LTX SZ8 (GLOVE) ×3 IMPLANT
GOWN STRL REUS W/TWL XL LVL3 (GOWN DISPOSABLE) ×6 IMPLANT
IRRIG SUCT STRYKERFLOW 2 WTIP (MISCELLANEOUS) ×3
IRRIGATION SUCT STRKRFLW 2 WTP (MISCELLANEOUS) ×2 IMPLANT
KIT BASIN OR (CUSTOM PROCEDURE TRAY) ×3 IMPLANT
KIT TURNOVER KIT A (KITS) ×2 IMPLANT
PENCIL SMOKE EVACUATOR (MISCELLANEOUS) IMPLANT
POUCH RETRIEVAL ECOSAC 10 (ENDOMECHANICALS) ×2 IMPLANT
POUCH RETRIEVAL ECOSAC 10MM (ENDOMECHANICALS) ×3
SCISSORS LAP 5X35 DISP (ENDOMECHANICALS) ×3 IMPLANT
SET CHOLANGIOGRAPH MIX (MISCELLANEOUS) ×3 IMPLANT
SET TUBE SMOKE EVAC HIGH FLOW (TUBING) ×3 IMPLANT
SHEARS HARMONIC ACE PLUS 36CM (ENDOMECHANICALS) ×2 IMPLANT
SLEEVE XCEL OPT CAN 5 100 (ENDOMECHANICALS) IMPLANT
SPIKE FLUID TRANSFER (MISCELLANEOUS) ×3 IMPLANT
SPONGE GAUZE 2X2 STER 10/PKG (GAUZE/BANDAGES/DRESSINGS) ×1
SUT MNCRL AB 4-0 PS2 18 (SUTURE) ×3 IMPLANT
SUT PDS AB 1 CT  36 (SUTURE)
SUT PDS AB 1 CT 36 (SUTURE) IMPLANT
SUT PROLENE 2 0 SH DA (SUTURE) ×2 IMPLANT
SYR 20ML LL LF (SYRINGE) ×3 IMPLANT
TOWEL OR 17X26 10 PK STRL BLUE (TOWEL DISPOSABLE) ×3 IMPLANT
TOWEL OR NON WOVEN STRL DISP B (DISPOSABLE) ×3 IMPLANT
TRAY LAPAROSCOPIC (CUSTOM PROCEDURE TRAY) ×3 IMPLANT
TROCAR ADV FIXATION 5X100MM (TROCAR) ×2 IMPLANT
TROCAR BLADELESS OPT 5 100 (ENDOMECHANICALS) ×3 IMPLANT
TROCAR XCEL NON-BLD 11X100MML (ENDOMECHANICALS) ×3 IMPLANT

## 2021-05-31 NOTE — Op Note (Addendum)
05/31/2021  PATIENT:  Sandra [name]  61 y.o. female  Patient Care Team: [name], MD as PCP - General (Internal Medicine) Arbutus Leas, PhD as Consulting Physician (Psychology) Tania Ade, MD as Consulting Physician (Orthopedic Surgery) Gatha Mayer, MD as Consulting Physician (Gastroenterology) Knox Royalty, RN as Case Manager Michael Boston, MD as Consulting Physician (General Surgery)  PRE-OPERATIVE DIAGNOSIS:    History of perforated gallbladder with giant abscesses status post percutaneous drainage. History of Roux-en-Y gastric bypass  POST-OPERATIVE DIAGNOSIS:   History of perforated gallbladder with giant abscesses status post percutaneous drainage November 2022 Chronic calculus cholecystitis. 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  ASSISTANT: Judyann Munson, RNFA     ANESTHESIA:    General with endotracheal intubation Local anesthetic as a field block  EBL:  (See Anesthesia Intraoperative Record) Total I/O In: 1929.8 [I.V.:1729.8; IV Piggyback:200] Out: 400 [Blood:400]   Delay start of Pharmacological VTE agent (>24hrs) due to surgical blood loss or risk of bleeding:  no  DRAINS: 19 Fr Blake closed bulb drain in the RUQ   SPECIMEN:  Partial gallbladder wall with gallstones and mucous Gallbladder hepatic fossa liver wedge  DISPOSITION OF SPECIMEN:  PATHOLOGY  COUNTS:  YES  PLAN OF CARE: Admit for overnight observation  PATIENT DISPOSITION:  PACU - hemodynamically stable.  INDICATION: Pleasant woman found to have abdominal pain and had giant abscess in the right upper quadrant.  Admitted with percutaneous drainage cholecystostomy 03/06/2021.  Eventually discharged stabilized.  Repeat CT scan showing resolution of the fluid abscesses.  Given it  been 3 months, I recommended interval cholecystectomy.  The anatomy & physiology of hepatobiliary & pancreatic function was discussed.  The pathophysiology of gallbladder dysfunction was discussed.  Natural history risks without surgery was discussed.   I feel the risks of no intervention will lead to serious problems that outweigh the operative risks; therefore, I recommended cholecystectomy to remove the pathology.  I explained laparoscopic techniques with possible need for an open approach.  Probable cholangiogram to evaluate the bilary tract was explained as well.    Risks such as bleeding, infection, abscess, leak, injury to other organs, need for further treatment, heart attack, death, and other risks were discussed.  I noted a good likelihood this will help address the problem.  Possibility that this will not correct all abdominal symptoms was explained.  Goals of post-operative recovery were discussed as well.  We will work to minimize complications.  An educational handout further explaining the pathology and treatment options was given as well.  Questions were answered.  The patient expresses understanding & wishes to proceed with surgery.  OR FINDINGS: Patient had extremely dense concrete adhesions of the right upper quadrant with extremely poor planes.  Transverse colon, mesocolon, omentum, and especially duodenal blub completely walling off the gallbladder with concrete adhesions.  Gallbladder necrotic with chronic clear colorless mucus within it and numerous medium size stones.  Not able to completely come around the gallbladder.  Resected most of the dome anterior wall and some of the posterior wall.  Old percutaneous drain removed.  Kendal Hymen of the surgical drain in place  Liver: Fatty change in the liver quite friable.  DESCRIPTION:   Informed consent was confirmed.  The patient underwent general anaesthesia without difficulty.  The patient was positioned appropriately.  VTE  prevention in place.  The patient's abdomen was clipped, prepped, & draped in a sterile fashion.  Surgical timeout confirmed our plan.  Peritoneal entry with a laparoscopic port was obtained using optical entry technique in the right upper abdomen as the patient was positioned in reverse Trendelenburg.  Entry was clean.  I induced carbon dioxide insufflation.  Camera inspection revealed no injury.  Extra ports were carefully placed under direct laparoscopic visualization.  I turned attention to the right upper quadrant.  There was still persistent large phlegmon in the region with dense adhesions.  I focused on careful laparoscopic Hydro dissection to help free some greater omentum and get some planes off.  Was able to identify the transverse colon and the mesocolon.  Found a short segment of transverse colon densely adherent to the hepatic ridge.  I freed the right hepatic lobe adhesions anteriorly laterally and inferiorly.  Freed off the retroperitoneum and kidney.  I freed the transverse colon off the duodenal bulb with careful blunt dissection.  Isolated the only corner of transverse colon stuck to the liver and where the gallbladder should be by CAT scan.  Freed adhesions off and encountered a pocket of mucus and dome of the gallbladder wall that was quite shrunken.  I removed the percutaneous catheter.  I did further dissection to free the transverse colon mesocolon off the right upper quadrant until it swept down more inferiorly.  Mobilized hepatic flexure to do this.  Freed the greater omentum off carefully in layers.  I freed the left lateral sector liver off the gastrojejunostomy of the gastric bypass and some of the antrum.  Some old mucus and freed off as well consistent with prior abscess there.  Freed the falciform ligament and came around back towards the right upper quadrant.  The very inflamed duodenal bulb was what was remained.  I did care to try and free a plane between the anterior  gallbladder wall and the duodenal bulb but it was densely concrete and there were no good soft planes at all. I transition to care for hydrodissection, [place] blunt dissection, harmonic dissection and transection.  Focus spatula tip cautery on the liver for hemostasis.  Transition to a dome down technique of gallbladder wall dome and came around circumferentially.  Work to free the posterior gallbladder wall off the liver down towards the infundibulum.  Very dense adhesions.  I held off going down on the retroperitoneum.  As I came further down the gallbladder opened up more.  I released a moderate volume of great clear colorless mucus with some mild purulence.  Numerous stones at the base of the gallbladder.  Removed all of them.  About 5 medium sized stones.  Washed out the region.  I tried to free the anterior and posterior gallbladder wall off the duodenal bulb but adhesions were extremely dense.  I freed some of the wall off but as I came more inferiorly it was difficult to tell where the infundibulum and common bile duct were.  I transected gradually anterior lateral and posterior gallbladder wall down to an infundibulum.  Could not find a definite lumen to ligate.  Came around anteriorly and encountered the cystic duct artery off the anterior medial infundibulum.  Placed clips upon that.  Had some bleeding on the posterior lateral gastric wall with a posterior cystic artery branch and placed clips on that.  Specimens of the gallbladder wall, gallstones, mucous, and liver sent for pathology  I did massive copious irrigation of over 6 L.  Assured hemostasis  on the omentum, liver, falciform ligament and elsewhere.  I did meticulous inspection.  I ended up excising a tongue of liver since the gallbladder was rather intrahepatic.  I sent that separately for tissue biopsy.  I allowed me to have a more visible gallbladder hepatic fossa to assure hemostasis.  Did get inspection hemostasis was good.  Return of  irrigation was much more clear.  Attempted a little more gallbladder stump dissection since she has a gastric bypass and ERCP would be very difficult to be possible to get to the common bile duct .  However the base of the infundibular gallbladder onto the retroperitoneum  was extremely densely adherent to the duodenum, deep posterior gallbladder hepatic fossa, common bile duct.  The tissues were fibrotic and concrete, and making laparoscopic oversewing of the infundibulum not going to be a feasible solution as it would tear and risk injury to deeper structures.  I decided to stop here to avoid common bile duct or other major injury.  Washed out a few more liters.  Hemostasis good.  Placed a drain to go in the retrohepatic space over the gallbladder fossa with a tail going towards the left lateral sector where her prior giant abscess had extended to.  Placed a large swath of healthy omentum to fill up the right upper quadrant and gallbladder hepatic fossa.  Subxiphoid fascial defect was 7 mm.  I closed it with 0 Vicryl interrupted suture.  Drain secured with 2-0 Prolene I closed the skin using 4-0 monocryl stitch.  Sterile dressings were applied. The patient was extubated & arrived in the PACU in stable condition..  I had discussed postoperative care with the patient in the holding area. I discussed operative findings, updated the patient's status, discussed probable steps to recovery, and gave postoperative recommendations to the  patient's  daughter, Minette Brine.  Recommendations were made.  Questions were answered.  She expressed understanding & appreciation.  [name], M.D., F.A.C.S. Gastrointestinal and Minimally Invasive Surgery [facility] [address], [place],  [address] [phone] Main / Paging  05/31/2021 2:47 PM

## 2021-05-31 NOTE — H&P (Signed)
REFERRING PHYSICIAN: [name], MD  Patient Care Team: [name], MD as PCP - General (Internal Medicine) Johney Maine, Adrian Saran, MD as Consulting Provider (General Surgery) Gatha Mayer, MD (Gastroenterology)  PROVIDER: Hollace Kinnier, MD  [facility] MRN: [id number] DOB: 1960-06-15 DATE OF ENCOUNTER: 05/31/2021   SUBJECTIVE   Chief Complaint: gallbladder removal   History of Present Illness: Sandra [name] is a 61 y.o. female  who is seen today as an office consultation  at the request of Dr. Quay Burow  for evaluation of gallbladder removal .   Pleasant woman with issues of abdominal pain for some time. Followed by Dr. Carlean Purl with inconclusive work-up. Had worsening with abdominal pain and fevers. Underwhelming radiology studies earlier in the year but CT scan showed right upper quadrant giant 20cm abscess replacing her gallbladder. She was admitted and placed on IV antibiotics. She underwent percutaneous drainage of her gallbladder 03/06/2021. Grew pansensitive E. coli. Also some anaerobic Fusobacterium. After 3 days was able to transition to outpatient oral antibiotics -Augmentin x10 more days. 2-week total course completed 11/21. History of gastric bypass in 2015 by Drs. Newman/Wilson with our group.  Patient here today with her daughter. Daughter does dressing changes and flushes. Normally and flushing well. She been gradual getting better but this past weekend had an episode of severe crampy pain with not much draining. 36 hours later it began to drain again and she has been feeling better. She is due for follow-up drain study in early January, 8 weeks from drain placement. They are wondering if they should do it sooner. Patient's appetite is not back to normal but she is back to using her bariatric protein shakes and tolerating food better. Occasional Zofran for nausea that has been a chronic issue. In general her energy is better. No more nausea or vomiting. She has not  had any recurrent symptoms off her antibiotics for the past week.  Medical History:  Past Medical History:  Diagnosis Date   Arthritis   Diabetes mellitus without complication (CMS-HCC)   History of cancer   Hypertension   There is no problem list on file for this patient.  Past Surgical History:  Procedure Laterality Date   ABDOMINAL HYSTERECTOMY W/ PARTIAL VAGINACTOMY   gastric bypass surgery   rotator cuff repairs surgery    Allergies  Allergen Reactions   Nsaids (Non-Steroidal Anti-Inflammatory Drug) Unknown  r/t gastric surgery   Statins-Hmg-Coa Reductase Inhibitors Unknown   Current Outpatient Medications on File Prior to Visit  Medication Sig Dispense Refill   blood glucose diagnostic (FREESTYLE LITE STRIPS) test strip Use daily as instructed.   gabapentin (NEURONTIN) 300 MG capsule gabapentin 300 mg capsule Take 1 capsule 3 times a Sandra by oral route.   multivitamin with minerals tablet Take 2 tablets by mouth once daily   omeprazole (PRILOSEC) 40 MG DR capsule   ondansetron (ZOFRAN-ODT) 4 MG disintegrating tablet   No current facility-administered medications on file prior to visit.   History reviewed. No pertinent family history.   Social History   Tobacco Use  Smoking Status Never  Smokeless Tobacco Never    Social History   Socioeconomic History   Marital status: Single  Tobacco Use   Smoking status: Never   Smokeless tobacco: Never  Vaping Use   Vaping Use: Never used  Substance and Sexual Activity   Alcohol use: Never   Drug use: Never   ############################################################  Review of Systems: A complete review of systems (ROS)  was obtained from the patient. I have reviewed this information and discussed as appropriate with the patient. See HPI as well for other pertinent ROS.  Constitutional: No fevers, chills, sweats. Weight stable Eyes: No vision changes, No discharge HENT: No sore throats, nasal  drainage Lymph: No neck swelling, No bruising easily Pulmonary: No cough, productive sputum CV: No orthopnea, PND Patient walks 60 minutes for about 2 miles without difficulty. No exertional chest/neck/shoulder/arm pain.  GI: No personal nor family history of GI/colon cancer, inflammatory bowel disease, irritable bowel syndrome, allergy such as Celiac Sprue, dietary/dairy problems, colitis, ulcers nor gastritis. No recent sick contacts/gastroenteritis. No travel outside the country. No changes in diet.  Renal: No UTIs, No hematuria Genital: No drainage, bleeding, masses Musculoskeletal: No severe joint pain. Good ROM major joints Skin: No sores or lesions Heme/Lymph: No easy bleeding. No swollen lymph nodes  OBJECTIVE   Vitals:  03/26/21 1457  BP: 118/74  Pulse: 85  Temp: 36.8 C (98.2 F)  SpO2: 99%  Weight: 63.1 kg (139 lb 3.2 oz)  Height: 157.5 cm (5\' 2" )    Body mass index is 25.46 kg/m.  PHYSICAL EXAM:  Constitutional: Not cachectic. Hygeine adequate. Vitals signs as above.  Eyes: Pupils reactive, normal extraocular movements. Sclera nonicteric Neuro: CN II-XII intact. No major focal sensory defects. No major motor deficits. Lymph: No head/neck/groin lymphadenopathy Psych: No severe agitation. No severe anxiety. Judgment & insight Adequate, Oriented x4, HENT: Normocephalic, Mucus membranes moist. No thrush.  Neck: Supple, No tracheal deviation. No obvious thyromegaly Chest: No pain to chest wall compression. Good respiratory excursion. No audible wheezing CV: Pulses intact. Regular rhythm. No major extremity edema Ext: No obvious deformity or contracture. Edema: Not present. No cyanosis Skin: No major subcutaneous nodules. Warm and dry Musculoskeletal: Severe joint rigidity not present. No obvious clubbing. No digital petechiae.   Abdomen:  Flat Hernia: Not present. Diastasis recti: Not present. Right upper quadrant percutaneous drain site clean without any erythema.  Mild sensitivity but otherwise abdomen is nontender. Somewhat firm while standing but then soft when she lies down. Nondistended. Nontender. No hepatomegaly. No splenomegaly  Genital/Pelvic: Inguinal hernia: Not present. Inguinal lymph nodes: without lymphadenopathy.   Rectal: (Deferred)    ###################################################################  Labs, Imaging and Diagnostic Testing:  Located in 'Care Everywhere' section of Epic EMR chart  PRIOR [facility] CLINIC NOTES:  Not applicable  SURGERY NOTES:  Not applicable  PATHOLOGY:  Not applicable  Assessment and Plan:  DIAGNOSES:  Diagnoses and all orders for this visit:  Right upper quadrant abdominal abscess (CMS-HCC)  Gallbladder perforation  Acute cholecystitis with chronic cholecystitis    ASSESSMENT/PLAN  Covering status post percutaneous drainage of 20 cm right upper quadrant abscess replacing her gallbladder.  Has completed 14 weeks of antibiotics and has not had no recurrent symptoms of for the past week.  However, it sounds like her drain clogged up this weekend. Might be wise to reach out to interventional radiology and see if they can repeat a drain study and possible CT scan/ultrasound to make sure her giant abscess is going down and improving and she does not need extra drains or upsizing of drain.   Given the giant abscess we have delayed interval cholecystectomy after drain placement to give a chance for infection inflammation to go down to demise injury to colon or other organs and delayed bile leak. That would make it in mid/late January. Would like to do it at a hospital in case need to convert or other issues.  Standard 4 port. I do not think she is a good candidate for single site approach at this point.  Last CAT scan done a month ago showed near resolution of the fluid collections and stable catheter position, reassuring sign  The anatomy & physiology of hepatobiliary & pancreatic function  was discussed. The pathophysiology of gallbladder dysfunction was discussed. Natural history risks without surgery was discussed. I feel the risks of no intervention will lead to serious problems that outweigh the operative risks; therefore, I recommended cholecystectomy to remove the pathology. I explained laparoscopic techniques with possible need for an open approach. Probable cholangiogram to evaluate the bilary tract was explained as well. Possible need for a needle core biopsy if liver changes seen or history of abnormal liver labs  Risks such as bleeding, infection, diarrhea and other bowel changes, abscess, leak, injury to other organs, need for repair of tissues / organs, need for further treatment, stroke, heart attack, death, and other risks were discussed. I noted a good likelihood this will help address the problem, but there is a chance it may not help. Possibility that this will not correct all abdominal symptoms was explained. Goals of post-operative recovery were discussed as well. We will work to minimize complications. An educational handout further explaining the pathology and treatment options was given as well. Questions were answered. The patient expresses understanding & wishes to proceed with surgery.  I would like to have a CT scan or ultrasound done to make sure the abscess is resolved and controlled before repeat CT scan is showed gradual resolution of the fluid collections which is reassuring.  Plan laparoscopic standard four-port approach given complexity of abscess etc.   [name], MD, FACS, MASCRS Esophageal, Gastrointestinal & Colorectal Surgery Robotic and Minimally Invasive Surgery  [facility], [place]  [place]. [address], [place], [place] [address] [phone] Fax [phone] Main  CONTACT INFORMATION:  Weekday (9AM-5PM): Call [facility] main office at [phone]  Weeknight (5PM-9AM) or  Weekend/Holiday: Check [email or web address] (password " [email or web address]") for General Surgery [facility] coverage  (Please, do not use SecureChat as it is not reliable communication to operating surgeons for immediate patient care)

## 2021-05-31 NOTE — Transfer of Care (Signed)
Immediate Anesthesia Transfer of Care Note  Patient: Sandra [name]  Procedure(s) Performed: LAPAROSCOPIC LYSIS OF ADHESIONS, PARTIAL CHOLECYSTECTOMY, DRAIN PLACEMENT, WEDGE LIVER BIOPSY (Abdomen)  Patient Location: PACU  Anesthesia Type:General  Level of Consciousness: awake, alert  and patient cooperative  Airway & Oxygen Therapy: Patient Spontanous Breathing and Patient connected to face mask oxygen  Post-op Assessment: Report given to RN and Post -op Vital signs reviewed and stable  Post vital signs: Reviewed and stable  Last Vitals:  Vitals Value Taken Time  BP 142/89 05/31/21 1426  Temp    Pulse 86 05/31/21 1430  Resp 17 05/31/21 1430  SpO2 97 % 05/31/21 1430  Vitals shown include unvalidated device data.  Last Pain:  Vitals:   05/31/21 0944  TempSrc: Oral         Complications: No notable events documented.

## 2021-05-31 NOTE — Anesthesia Procedure Notes (Signed)
Procedure Name: Intubation Date/Time: 05/31/2021 11:50 AM Performed by: Eben Burow, CRNA Pre-anesthesia Checklist: Patient identified, Emergency Drugs available, Suction available, Patient being monitored and Timeout performed Patient Re-evaluated:Patient Re-evaluated prior to induction Oxygen Delivery Method: Circle system utilized Preoxygenation: Pre-oxygenation with 100% oxygen Induction Type: IV induction Ventilation: Mask ventilation without difficulty Laryngoscope Size: Glidescope and 3 Tube type: Oral Tube size: 7.0 mm Number of attempts: 1 Airway Equipment and Method: Stylet Placement Confirmation: ETT inserted through vocal cords under direct vision, positive ETCO2 and breath sounds checked- equal and bilateral Secured at: 21 cm Tube secured with: Tape Dental Injury: Teeth and Oropharynx as per pre-operative assessment

## 2021-05-31 NOTE — Anesthesia Postprocedure Evaluation (Signed)
Anesthesia Post Note  Patient: Sandra [name]  Procedure(s) Performed: LAPAROSCOPIC LYSIS OF ADHESIONS, PARTIAL CHOLECYSTECTOMY, DRAIN PLACEMENT, WEDGE LIVER BIOPSY (Abdomen)     Patient location during evaluation: PACU Anesthesia Type: General Level of consciousness: awake and alert Pain management: pain level controlled Vital Signs Assessment: post-procedure vital signs reviewed and stable Respiratory status: spontaneous breathing, nonlabored ventilation, respiratory function stable and patient connected to nasal cannula oxygen Cardiovascular status: blood pressure returned to baseline and stable Postop Assessment: no apparent nausea or vomiting Anesthetic complications: no   No notable events documented.  Last Vitals:  Vitals:   05/31/21 1630 05/31/21 1645  BP: (!) 153/90 (!) 165/94  Pulse: 90 91  Resp: 15 13  Temp: 36.7 C   SpO2: 94% 100%    Last Pain:  Vitals:   05/31/21 1645  TempSrc:   PainSc: 2                  Barnet [place]

## 2021-05-31 NOTE — Discharge Instructions (Addendum)
################################################################ ? ?LAPAROSCOPIC SURGERY: POST OP INSTRUCTIONS ? ?###################################################################### ? ?EAT ?Gradually transition to a high fiber diet with a fiber supplement over the next few weeks after discharge.  Start with a pureed / full liquid diet (see below) ? ?WALK ?Walk an hour a day.  Control your pain to do that.   ? ?CONTROL PAIN ?Control pain so that you can walk, sleep, tolerate sneezing/coughing, go up/down stairs. ? ?HAVE A BOWEL MOVEMENT DAILY ?Keep your bowels regular to avoid problems.  OK to try a laxative to override constipation.  OK to use an antidairrheal to slow down diarrhea.  Call if not better after 2 tries ? ?CALL IF YOU HAVE PROBLEMS/CONCERNS ?Call if you are still struggling despite following these instructions. ?Call if you have concerns not answered by these instructions ? ?###################################################################### ? ? ? ?DIET: Follow a light bland diet & liquids the first 24 hours after arrival home, such as soup, liquids, starches, etc.  Be sure to drink plenty of fluids.  Quickly advance to a usual solid diet within a few days.  Avoid fast food or heavy meals as your are more likely to get nauseated or have irregular bowels.  A low-fat, high-fiber diet for the rest of your life is ideal. ? ?Take your usually prescribed home medications unless otherwise directed. ? ?PAIN CONTROL: ?Pain is best controlled by a usual combination of three different methods TOGETHER: ?Ice/Heat ?Over the counter pain medication ?Prescription pain medication ?Most patients will experience some swelling and bruising around the incisions.  Ice packs or heating pads (30-60 minutes up to 6 times a day) will help. Use ice for the first few days to help decrease swelling and bruising, then switch to heat to help relax tight/sore spots and speed recovery.  Some people prefer to use ice alone, heat  alone, alternating between ice & heat.  Experiment to what works for you.  Swelling and bruising can take several weeks to resolve.   ?It is helpful to take an over-the-counter pain medication regularly for the first few weeks.  Choose one of the following that works best for you: ?Naproxen (Aleve, etc)  Two 220mg tabs twice a day ?Ibuprofen (Advil, etc) Three 200mg tabs four times a day (every meal & bedtime) ?Acetaminophen (Tylenol, etc) 500-650mg four times a day (every meal & bedtime) ?A  prescription for pain medication (such as oxycodone, hydrocodone, tramadol, gabapentin, methocarbamol, etc) should be given to you upon discharge.  Take your pain medication as prescribed.  ?If you are having problems/concerns with the prescription medicine (does not control pain, nausea, vomiting, rash, itching, etc), please call us [phone] to see if we need to switch you to a different pain medicine that will work better for you and/or control your side effect better. ?If you need a refill on your pain medication, please give us 48 hour notice.  contact your pharmacy.  They will contact our office to request authorization. Prescriptions will not be filled after 5 pm or on week-ends ? ?Avoid getting constipated.   ?Between the surgery and the pain medications, it is common to experience some constipation.   ?Increasing fluid intake and taking a fiber supplement (such as Metamucil, Citrucel, FiberCon, MiraLax, etc) 1-2 times a day regularly will usually help prevent this problem from occurring.   ?A mild laxative (prune juice, Milk of Magnesia, MiraLax, etc) should be taken according to package directions if there are no bowel movements after 48 hours.   ?Watch out for diarrhea.   ?  If you have many loose bowel movements, simplify your diet to bland foods & liquids for a few days.   ?Stop any stool softeners and decrease your fiber supplement.   ?Switching to mild anti-diarrheal medications (Kayopectate, Pepto Bismol) can  help.   ?If this worsens or does not improve, please call us. ? ?Wash / shower every day.  You may shower over the dressings as they are waterproof.  Continue to shower over incision(s) after the dressing is off. ? ?REMOVE ALL DRESSINGS: Remove your waterproof bandages (tegaderm clear band-aids, steristrip skin tapes, etc) THREE DAYS AFTER SURGERY.  You may leave the incisions open to air.  You may replace a dressing/Band-Aid to cover the incision for comfort if you wish.  ? ?ACTIVITIES as tolerated:   ?You may resume regular (light) daily activities beginning the next day--such as daily self-care, walking, climbing stairs--gradually increasing activities as tolerated.  If you can walk 30 minutes without difficulty, it is safe to try more intense activity such as jogging, treadmill, bicycling, low-impact aerobics, swimming, etc. ?Save the most intensive and strenuous activity for last such as sit-ups, heavy lifting, contact sports, etc  Refrain from any heavy lifting or straining until you are off narcotics for pain control.   ?DO NOT PUSH THROUGH PAIN.  Let pain be your guide: If it hurts to do something, don't do it.  Pain is your body warning you to avoid that activity for another week until the pain goes down. ?You may drive when you are no longer taking prescription pain medication, you can comfortably wear a seatbelt, and you can safely maneuver your car and apply brakes. ?You may have sexual intercourse when it is comfortable. ? ?FOLLOW UP in our office ?Please call [facility] at [phone] to set up an appointment to see your surgeon in the office for a follow-up appointment approximately 2-3 weeks after your surgery. ?Make sure that you call for this appointment the day you arrive home to insure a convenient appointment time. ? ?10. IF YOU HAVE DISABILITY OR FAMILY LEAVE FORMS, BRING THEM TO THE OFFICE FOR PROCESSING.  DO NOT GIVE THEM TO YOUR DOCTOR. ? ? ?WHEN TO CALL US [phone]: ?Poor pain  control ?Reactions / problems with new medications (rash/itching, nausea, etc)  ?Fever over 101.5 F (38.5 C) ?Inability to urinate ?Nausea and/or vomiting ?Worsening swelling or bruising ?Continued bleeding from incision. ?Increased pain, redness, or drainage from the incision ? ? The clinic staff is available to answer your questions during regular business hours (8:30am-5pm).  Please don?t hesitate to call and ask to speak to one of our nurses for clinical concerns.  ? If you have a medical emergency, go to the nearest emergency room or call 911. ? A surgeon from [facility] is always on call at the hospitals ? ? ?[facility] ?[address], [address], [place], [place]  [address] ? ?MAIN: [phone] ? TOLL FREE: [phone] ?  ?FAX [phone] ?[email or web address] ? ?############################################################## ? ? ? ?

## 2021-05-31 NOTE — Interval H&P Note (Signed)
History and Physical Interval Note:  05/31/2021 9:41 AM  Sandra [name]  has presented today for surgery, with the diagnosis of symptomatic biliary colic.  The various methods of treatment have been discussed with the patient and family. After consideration of risks, benefits and other options for treatment, the patient has consented to  Procedure(s): LAPAROSCOPIC CHOLECYSTECTOMY WITH INTRAOPERATIVE CHOLANGIOGRAM (N/A) POSSIBLE NEEDLE CORE BIOPSY OF LIVER (N/A) as a surgical intervention.  The patient's history has been reviewed, patient examined, no change in status, stable for surgery.  I have reviewed the patient's chart and labs.  Questions were answered to the patient's satisfaction.     [name]  I have re-reviewed the the patient's records, history, medications, and allergies.  I have re-examined the patient.  I again discussed intraoperative plans and goals of post-operative recovery.  The patient agrees to proceed.  [name]  [birth year] [id number]  Patient Care Team: [name], MD as PCP - General (Internal Medicine) Arbutus Leas, PhD as Consulting Physician (Psychology) Tania Ade, MD as Consulting Physician (Orthopedic Surgery) Gatha Mayer, MD as Consulting Physician (Gastroenterology) Knox Royalty, RN as Case Manager Michael Boston, MD as Consulting Physician (General Surgery)  Patient Active Problem List   Diagnosis Date Noted   Gallbladder abscess 05/02/2021   Cholelithiasis 04/06/2021   Abscess 04/06/2021   Intra-abdominal abscess ([place]) 03/06/2021   Normocytic anemia 03/06/2021   Hypokalemia 03/06/2021   Hyponatremia 03/06/2021   Thrombocytosis 03/06/2021   Moderate protein-calorie malnutrition ([place]) 03/06/2021   Abdominal pain 02/17/2021   Right carpal tunnel syndrome 11/10/2020   Labral tear of right hip joint 11/10/2020   Chronic right hip pain 04/10/2020   Nonallopathic lesion of sacral region 08/05/2019   Greater  trochanteric bursitis, right 08/05/2019   Benign neoplasm of descending colon    Low back pain 05/27/2019   Hematoma of right hip 05/07/2019   Trigger finger, right index finger 02/17/2019   Anxiety 11/12/2018   Left lateral epicondylitis 05/14/2018   Left elbow pain 05/04/2018   Right rotator cuff tear 08/20/2017   Cervical disc disorder with radiculopathy of cervical region 04/25/2016   Nonallopathic lesion of cervical region 04/17/2016   Nonallopathic lesion of thoracic region 04/17/2016   Nonallopathic lesion of rib cage 04/17/2016   Nonallopathic lesion of lumbosacral region 04/17/2016   Degenerative cervical disc 03/29/2016   Degenerative arthritis of right knee 03/29/2016   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   Adjustment disorder with mixed anxiety and depressed mood 07/14/2014   Personal history of colonic polyps    History of Roux-en-Y gastric bypass, 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   Chronic constipation 03/05/2013   Coronary atherosclerosis of native coronary artery 08/27/2011   History of uterine cancer 08/15/2011   Type 2 diabetes mellitus ([place]) 08/15/2011   Hypercholesterolemia 08/15/2011   History of colonic polyps 12/29/2009    Past Medical History:  Diagnosis Date   Anxiety    Arthritis    CAD (coronary artery disease)    nonobstructive CAD in LAD per 2013 CTA coronary morph   Cholelithiasis 04/06/2021   Depression    Diabetes mellitus    no meds taken now, checks cbg once or twice a week    Difficult intubation    had to use the glidescope due to small airway    Gallbladder abscess 05/02/2021   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 HYSTERECTOMY   STAGE I ENDOMETROID CARCINOMA   History of kidney stones    Hx of adenomatous colonic polyps 2011   BENIGN   Hyperlipidemia    no meds now   Hypertension    Pneumonia    Sleep apnea    no CPAP since surgery    Past Surgical History:  Procedure Laterality Date   ABDOMINAL HYSTERECTOMY  12-20-2004   TAH   BILATERAL BREAST REDUCTION  11-16-1999   BIOPSY  02/20/2021   Procedure: BIOPSY;  Surgeon: Daryel November, MD;  Location: [facility];  Service: Gastroenterology;;   [place] N/A 05/27/2013   Procedure: Lauris Chroman;  Surgeon: Shann Medal, MD;  Location: [facility];  Service: General;  Laterality: N/A;   [place] N/A 04/25/2014   Procedure: COLONOSCOPY;  Surgeon: Gatha Mayer, MD;  Location: [facility];  Service: Endoscopy;  Laterality: N/A;   COLONOSCOPY WITH PROPOFOL N/A 07/26/2019   Procedure: COLONOSCOPY WITH PROPOFOL;  Surgeon: Gatha Mayer, MD;  Location: [facility];  Service: Endoscopy;  Laterality: N/A;   CYSTOSCOPY  09/06/2011   Procedure: CYSTOSCOPY;  Surgeon: Ailene Rud, MD;  Location[facility];  Service: Urology;  Laterality: N/A;  LYNX SLING    ESOPHAGOGASTRODUODENOSCOPY (EGD) WITH PROPOFOL N/A 09/27/2014   Procedure: ESOPHAGOGASTRODUODENOSCOPY (EGD) WITH PROPOFOL;  Surgeon: Gatha Mayer, MD;  Location: [facility];  Service: Endoscopy;  Laterality: N/A;   ESOPHAGOGASTRODUODENOSCOPY (EGD) WITH PROPOFOL N/A 03/11/2016   Procedure: ESOPHAGOGASTRODUODENOSCOPY (EGD) WITH PROPOFOL;  Surgeon: Jerene Bears, MD;  Location: [facility];  Service: Endoscopy;  Laterality: N/A;   ESOPHAGOGASTRODUODENOSCOPY (EGD) WITH PROPOFOL Left 02/20/2021   Procedure: ESOPHAGOGASTRODUODENOSCOPY (EGD) WITH PROPOFOL;  Surgeon: Daryel November, MD;  Location: [place];  Service: Gastroenterology;  Laterality: Left;   GASTRIC BYPASS  10/04/2013    GASTRIC ROUX-EN-Y N/A 10/04/2013   Procedure: LAPAROSCOPIC ROUX-EN-Y GASTRIC BYPASS WITH UPPER ENDOSCOPY;  Surgeon: Shann Medal, MD;  Location: [facility];  Service: General;  Laterality: N/A;   HEMORRHOID BANDING  02/2013   IR CHOLANGIOGRAM EXISTING TUBE  05/01/2021   IR PERC CHOLECYSTOSTOMY  03/06/2021   IR RADIOLOGIST EVAL & MGMT  04/12/2021   POLYPECTOMY     POLYPECTOMY  07/26/2019   Procedure: POLYPECTOMY;  Surgeon: Gatha Mayer, MD;  Location: [facility];  Service: Endoscopy;;   PUBOVAGINAL SLING  09/06/2011   Procedure: Gaynelle Arabian;  Surgeon: Ailene Rud, MD;  Location[facility];  Service: Urology;  Laterality: N/A;   REDUCTION MAMMAPLASTY Bilateral 2001   SHOULDER ARTHROSCOPY Right 10/23/2017   Procedure: RIGHT SHOULDER ARTHROSCOPIC ROTATOR CUFF REPAIR VS. DEBRIDEMENT, SUBACROMIAL DECOMPARESSION, DISTAL CLAVICAL EXCISION;  Surgeon: Tania Ade, MD;  Location: [place];  Service: Orthopedics;  Laterality: Right;   UVULOPALATOPHARYNGOPLASTY, TONSILLECTOMY AND SEPTOPLASTY  1994    Social History   Socioeconomic History   Marital status: Single    Spouse name: Not on file   Number of children: Not on file   Years of education: Not on file   Highest education level: Not on file  Occupational History   Occupation: [employer]  tech  Tobacco Use   Smoking status: Never   Smokeless tobacco: Never  Vaping Use   Vaping Use: Never used  Substance and Sexual Activity   Alcohol use: No    Alcohol/week: 0.0 standard drinks   Drug use: No   Sexual activity: Not Currently    Birth control/protection: Surgical    Comment: HYST.. 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[employer]   Social Determinants of Health   Financial Resource Strain: Not on  file  Food Insecurity: No Food Insecurity   Worried About Charity fundraiser in the Last Year: Never true   Arboriculturist in the Last Year: Never true  Transportation Needs: No Transportation Needs   Lack of Transportation (Medical): No   Lack of Transportation (Non-Medical): No  Physical Activity: Not on file  Stress: Not on file  Social Connections: Not on file  Intimate Partner Violence: Not on file    Family History  Adopted: Yes    Medications Prior to Admission  Medication Sig Dispense Refill Last Dose   acetaminophen (TYLENOL) 500 MG tablet Take 500 mg by mouth every 6 (six) hours as needed for moderate pain.      COLLAGEN PO Take 1 Scoop by mouth daily.      gabapentin (NEURONTIN) 300 MG capsule Take 1 capsule by mouth 2 times daily. (Patient taking differently: Take 300 mg by mouth at bedtime.) 180 capsule 0    Multiple Vitamins-Minerals (MULTIVITAMIN PO) Take 2 tablets by mouth daily.       omeprazole (PRILOSEC) 40 MG capsule Take 1 capsule by mouth 2 times daily before a meal. Open capsule and sprinkle in water, other liquid or applesauce (Patient taking differently: Take 40 mg by mouth daily as needed (acid reflux).) 180 capsule 1    ondansetron (ZOFRAN-ODT) 4 MG disintegrating tablet DISSOLVE 1 TABLET BY MOUTH EVERY 8 HOURS AS NEEDED FOR NAUSEA OR VOMITING (MAY TAKE 2 TABLETS IF 1 TABLET IS INEFFECTIVE) (Patient taking differently: Take 4-8 mg by mouth every 8 (eight) hours as needed for nausea or vomiting.) 30 tablet 3    polyethylene glycol powder (MIRALAX) 17 GM/SCOOP powder Take 17 g by mouth daily. 238 g 0    Blood Glucose Monitoring Suppl (FREESTYLE LITE) w/Device KIT Use up to 4 times daily to check blood glucose daily as directed. 1 kit 0    glucose blood (FREESTYLE LITE) test strip Use daily as instructed. 100 each 12    glucose blood (FREESTYLE LITE) test strip Use up to 4 times daily to check blood glucose as directed. 100 each 0    Lancets (FREESTYLE) lancets  Use 1 lancet up to 4 times daily to check blood glucose as directed. 100 each 0    Sodium Chloride Flush (NORMAL SALINE FLUSH) 0.9 % SOLN Flush drain once daily as directed 300 mL 1     No current facility-administered medications for this encounter.     Allergies  Allergen Reactions   Nsaids     r/t gastric surgery   Statins     Muscle aches    There were no vitals taken for this visit.  Labs: No results found for this or any previous visit (from the past 48 hour(s)).  Imaging / Studies: IR  CHOLANGIOGRAM EXISTING TUBE  Result Date: 05/01/2021 INDICATION: 61 year old with history of perforated cholecystitis and status post percutaneous cholecystostomy tube drainage. Patient complains of leakage around the tube and difficulty flushing the tube. EXAM: 1. CHOLANGIOGRAM THROUGH EXISTING TUBE 2. EXCHANGE OF CHOLECYSTOSTOMY TUBE WITH FLUOROSCOPY MEDICATIONS: None ANESTHESIA/SEDATION: None FLUOROSCOPY TIME:  Fluoroscopy Time: 2 minutes, 42 seconds, 14 mGy COMPLICATIONS: None immediate. PROCEDURE: Patient was placed supine on the interventional table. Scout image of the cholecystostomy tube was obtained. Attempted to inject the tube with contrast with the tube was occluded. Therefore, decided to perform drain exchange followed by cholangiogram. The procedure was explained to the patient. The risks and benefits of the procedure were discussed and the patient's questions were addressed. Informed consent was obtained from the patient. The right side of the abdomen and existing drain were prepped and draped in sterile fashion. Maximal barrier sterile technique was utilized including caps, mask, sterile gowns, sterile gloves, sterile drape, hand hygiene and skin antiseptic. The 2 was cut and removed over a superstiff Amplatz wire with difficulty. A 5 French Kumpe catheter was advanced over the wire and cholangiogram was performed. Kumpe catheter was removed using a Bentson wire and new 10.2 [place]  multipurpose drain was placed over the wire. Additional contrast was injected. Tube was flushed with saline and attached to a gravity bag. Skin was anesthetized with 1% lidocaine and sutured to the skin. FINDINGS: The old cholecystostomy tube was occluded. After successful exchange of the cholecystostomy tube, the cholangiogram again demonstrates a very irregular gallbladder. Findings compatible with previous perforation. No evidence for a bowel fistula and no definite filling of the common bile duct. IMPRESSION: 1. The original cholecystostomy tube was occluded and the drain was successful exchange for new 10 [place] multipurpose drain. 2. Again noted is an irregular gallbladder compatible with history of cholecystitis and perforation. No clear filling of the common bile duct. 3. Instructed the patient to continue with routine flushing. Recommend 5 mL of sterile saline twice a day. Patient is scheduled for cholecystectomy in February 2023. Electronically Signed   By: Markus Daft M.D.   On: 05/01/2021 12:58     .[name], M.D., F.A.C.S. Gastrointestinal and Minimally Invasive Surgery [facility] [address], [place], [place] [address] [phone] Main / Paging  05/31/2021 9:41 AM

## 2021-06-01 ENCOUNTER — Other Ambulatory Visit (HOSPITAL_COMMUNITY): Payer: Self-pay

## 2021-06-01 ENCOUNTER — Encounter (HOSPITAL_COMMUNITY): Payer: Self-pay | Admitting: Surgery

## 2021-06-01 DIAGNOSIS — Z888 Allergy status to other drugs, medicaments and biological substances status: Secondary | ICD-10-CM | POA: Diagnosis not present

## 2021-06-01 DIAGNOSIS — M1990 Unspecified osteoarthritis, unspecified site: Secondary | ICD-10-CM | POA: Diagnosis present

## 2021-06-01 DIAGNOSIS — Z79899 Other long term (current) drug therapy: Secondary | ICD-10-CM | POA: Diagnosis not present

## 2021-06-01 DIAGNOSIS — Z90711 Acquired absence of uterus with remaining cervical stump: Secondary | ICD-10-CM | POA: Diagnosis not present

## 2021-06-01 DIAGNOSIS — R1011 Right upper quadrant pain: Secondary | ICD-10-CM | POA: Diagnosis not present

## 2021-06-01 DIAGNOSIS — K812 Acute cholecystitis with chronic cholecystitis: Secondary | ICD-10-CM | POA: Diagnosis present

## 2021-06-01 DIAGNOSIS — Z886 Allergy status to analgesic agent status: Secondary | ICD-10-CM | POA: Diagnosis not present

## 2021-06-01 DIAGNOSIS — Z9884 Bariatric surgery status: Secondary | ICD-10-CM | POA: Diagnosis not present

## 2021-06-01 DIAGNOSIS — I10 Essential (primary) hypertension: Secondary | ICD-10-CM | POA: Diagnosis present

## 2021-06-01 DIAGNOSIS — K660 Peritoneal adhesions (postprocedural) (postinfection): Secondary | ICD-10-CM | POA: Diagnosis present

## 2021-06-01 DIAGNOSIS — L02211 Cutaneous abscess of abdominal wall: Secondary | ICD-10-CM | POA: Diagnosis present

## 2021-06-01 DIAGNOSIS — K82A2 Perforation of gallbladder in cholecystitis: Secondary | ICD-10-CM | POA: Diagnosis not present

## 2021-06-01 DIAGNOSIS — E119 Type 2 diabetes mellitus without complications: Secondary | ICD-10-CM | POA: Diagnosis present

## 2021-06-01 DIAGNOSIS — K839 Disease of biliary tract, unspecified: Secondary | ICD-10-CM | POA: Diagnosis not present

## 2021-06-01 DIAGNOSIS — B9620 Unspecified Escherichia coli [E. coli] as the cause of diseases classified elsewhere: Secondary | ICD-10-CM | POA: Diagnosis present

## 2021-06-01 DIAGNOSIS — K7581 Nonalcoholic steatohepatitis (NASH): Secondary | ICD-10-CM | POA: Diagnosis present

## 2021-06-01 LAB — CBC
HCT: 33.5 % — ABNORMAL LOW (ref 36.0–46.0)
Hemoglobin: 10.8 g/dL — ABNORMAL LOW (ref 12.0–15.0)
MCH: 30.5 pg (ref 26.0–34.0)
MCHC: 32.2 g/dL (ref 30.0–36.0)
MCV: 94.6 fL (ref 80.0–100.0)
Platelets: 246 10*3/uL (ref 150–400)
RBC: 3.54 MIL/uL — ABNORMAL LOW (ref 3.87–5.11)
RDW: 13 % (ref 11.5–15.5)
WBC: 11.7 10*3/uL — ABNORMAL HIGH (ref 4.0–10.5)
nRBC: 0 % (ref 0.0–0.2)

## 2021-06-01 LAB — HEPATIC FUNCTION PANEL
ALT: 88 U/L — ABNORMAL HIGH (ref 0–44)
AST: 96 U/L — ABNORMAL HIGH (ref 15–41)
Albumin: 3.1 g/dL — ABNORMAL LOW (ref 3.5–5.0)
Alkaline Phosphatase: 61 U/L (ref 38–126)
Bilirubin, Direct: 0.1 mg/dL (ref 0.0–0.2)
Indirect Bilirubin: 0.3 mg/dL (ref 0.3–0.9)
Total Bilirubin: 0.4 mg/dL (ref 0.3–1.2)
Total Protein: 5.5 g/dL — ABNORMAL LOW (ref 6.5–8.1)

## 2021-06-01 LAB — BASIC METABOLIC PANEL
Anion gap: 7 (ref 5–15)
BUN: 13 mg/dL (ref 8–23)
CO2: 29 mmol/L (ref 22–32)
Calcium: 8.7 mg/dL — ABNORMAL LOW (ref 8.9–10.3)
Chloride: 98 mmol/L (ref 98–111)
Creatinine, Ser: 0.58 mg/dL (ref 0.44–1.00)
GFR, Estimated: >60 mL/min (ref >60)
Glucose, Bld: 160 mg/dL — ABNORMAL HIGH (ref 70–99)
Potassium: 4 mmol/L (ref 3.5–5.1)
Sodium: 134 mmol/L — ABNORMAL LOW (ref 135–145)

## 2021-06-01 LAB — GLUCOSE, CAPILLARY
Glucose-Capillary: 133 mg/dL — ABNORMAL HIGH (ref 70–99)
Glucose-Capillary: 154 mg/dL — ABNORMAL HIGH (ref 70–99)
Glucose-Capillary: 135 mg/dL — ABNORMAL HIGH (ref 70–99)
Glucose-Capillary: 140 mg/dL — ABNORMAL HIGH (ref 70–99)

## 2021-06-01 LAB — MAGNESIUM: Magnesium: 1.6 mg/dL — ABNORMAL LOW (ref 1.7–2.4)

## 2021-06-01 LAB — SURGICAL PATHOLOGY

## 2021-06-01 MED ORDER — GLUCERNA SHAKE PO LIQD
237.0000 mL | Freq: Two times a day (BID) | ORAL | Status: DC
  Administered 2021-06-01 – 2021-06-03 (×5): 237 mL via ORAL
  Filled 2021-06-01 (×9): qty 237

## 2021-06-01 MED ORDER — HYDROMORPHONE HCL 1 MG/ML IJ SOLN: 0.5000 mg | INTRAMUSCULAR | Status: DC | PRN

## 2021-06-01 MED ORDER — MAGNESIUM SULFATE 2 GM/50ML IV SOLN
2.0000 g | Freq: Once | INTRAVENOUS | Status: AC
  Administered 2021-06-01: 2 g via INTRAVENOUS
  Filled 2021-06-01: qty 50

## 2021-06-01 MED ORDER — METHOCARBAMOL 500 MG PO TABS
500.0000 mg | ORAL_TABLET | Freq: Four times a day (QID) | ORAL | Status: DC | PRN
  Administered 2021-06-01 – 2021-06-02 (×2): 1000 mg via ORAL
  Administered 2021-06-03: 500 mg via ORAL
  Administered 2021-06-03: 1000 mg via ORAL
  Administered 2021-06-05: 500 mg via ORAL
  Filled 2021-06-01: qty 2
  Filled 2021-06-01: qty 1
  Filled 2021-06-01 (×3): qty 2

## 2021-06-01 MED ORDER — INSULIN ASPART 100 UNIT/ML IJ SOLN
0.0000 [IU] | Freq: Three times a day (TID) | INTRAMUSCULAR | Status: DC
  Administered 2021-06-01 – 2021-06-02 (×2): 2 [IU] via SUBCUTANEOUS
  Administered 2021-06-02 – 2021-06-03 (×2): 3 [IU] via SUBCUTANEOUS
  Administered 2021-06-03: 2 [IU] via SUBCUTANEOUS
  Administered 2021-06-05: 3 [IU] via SUBCUTANEOUS

## 2021-06-01 MED ORDER — LACTATED RINGERS IV BOLUS: 1000.0000 mL | Freq: Three times a day (TID) | INTRAVENOUS | Status: DC | PRN

## 2021-06-01 MED ORDER — ONDANSETRON HCL 4 MG/2ML IJ SOLN: 4.0000 mg | Freq: Four times a day (QID) | INTRAMUSCULAR | Status: DC

## 2021-06-01 MED ORDER — OXYCODONE HCL 5 MG PO TABS
5.0000 mg | ORAL_TABLET | ORAL | 0 refills | Status: DC | PRN
Start: 2021-06-01 — End: 2021-12-02
  Filled 2021-06-01: qty 30, 3d supply, fill #0

## 2021-06-01 MED ORDER — SODIUM CHLORIDE 0.9 % IV SOLN
250.0000 mL | INTRAVENOUS | Status: DC | PRN
  Administered 2021-06-02: 250 mL via INTRAVENOUS

## 2021-06-01 MED ORDER — ONDANSETRON HCL 4 MG/2ML IJ SOLN
4.0000 mg | Freq: Four times a day (QID) | INTRAMUSCULAR | Status: DC | PRN
  Administered 2021-06-01 – 2021-06-05 (×2): 4 mg via INTRAVENOUS
  Filled 2021-06-01 (×2): qty 2

## 2021-06-01 MED ORDER — INSULIN ASPART 100 UNIT/ML IJ SOLN: 0.0000 [IU] | Freq: Every day | INTRAMUSCULAR | Status: DC

## 2021-06-01 MED ORDER — TRAMADOL HCL 50 MG PO TABS: 50.0000 mg | ORAL_TABLET | Freq: Four times a day (QID) | ORAL | Status: DC | PRN

## 2021-06-01 MED ORDER — MORPHINE SULFATE (PF) 2 MG/ML IV SOLN
1.0000 mg | INTRAVENOUS | Status: DC | PRN
  Administered 2021-06-01 (×2): 2 mg via INTRAVENOUS
  Filled 2021-06-01 (×2): qty 1

## 2021-06-01 MED ORDER — GABAPENTIN 300 MG PO CAPS
300.0000 mg | ORAL_CAPSULE | Freq: Two times a day (BID) | ORAL | 0 refills | Status: DC
  Filled 2021-06-01 – 2021-06-20 (×2): qty 180, 90d supply, fill #0

## 2021-06-01 MED ORDER — OXYCODONE HCL 5 MG PO TABS
5.0000 mg | ORAL_TABLET | ORAL | Status: DC | PRN
  Administered 2021-06-01 – 2021-06-02 (×4): 10 mg via ORAL
  Administered 2021-06-02: 5 mg via ORAL
  Administered 2021-06-02 – 2021-06-03 (×2): 10 mg via ORAL
  Administered 2021-06-03: 5 mg via ORAL
  Administered 2021-06-03 – 2021-06-05 (×9): 10 mg via ORAL
  Filled 2021-06-01 (×2): qty 2
  Filled 2021-06-01: qty 1
  Filled 2021-06-01 (×9): qty 2
  Filled 2021-06-01: qty 1
  Filled 2021-06-01 (×4): qty 2
  Filled 2021-06-01: qty 1
  Filled 2021-06-01: qty 2

## 2021-06-01 MED ORDER — SODIUM CHLORIDE 0.9% FLUSH: 3.0000 mL | INTRAVENOUS | Status: DC | PRN

## 2021-06-01 MED ORDER — GABAPENTIN 100 MG PO CAPS
200.0000 mg | ORAL_CAPSULE | Freq: Three times a day (TID) | ORAL | Status: DC
  Administered 2021-06-01 (×3): 200 mg via ORAL
  Filled 2021-06-01 (×3): qty 2

## 2021-06-01 MED ORDER — SODIUM CHLORIDE 0.9% FLUSH
3.0000 mL | Freq: Two times a day (BID) | INTRAVENOUS | Status: DC
  Administered 2021-06-01 – 2021-06-04 (×4): 3 mL via INTRAVENOUS

## 2021-06-01 MED ORDER — HYDROMORPHONE HCL 1 MG/ML IJ SOLN
0.5000 mg | INTRAMUSCULAR | Status: DC | PRN
  Administered 2021-06-04 – 2021-06-05 (×2): 1 mg via INTRAVENOUS
  Filled 2021-06-01 (×2): qty 1

## 2021-06-01 MED ORDER — OXYCODONE HCL 5 MG PO TABS
5.0000 mg | ORAL_TABLET | Freq: Four times a day (QID) | ORAL | Status: DC | PRN
  Administered 2021-06-01: 10 mg via ORAL
  Filled 2021-06-01: qty 2

## 2021-06-01 NOTE — Progress Notes (Signed)
Sandra [name] [id number] 1961/02/05  CARE TEAM:  PCP: [name], MD  Outpatient Care Team: Patient Care Team: [name], MD as PCP - General (Internal Medicine) Arbutus Leas, PhD as Consulting Physician (Psychology) Tania Ade, MD as Consulting Physician (Orthopedic Surgery) Gatha Mayer, MD as Consulting Physician (Gastroenterology) Knox Royalty, RN as Case Manager Michael Boston, MD as Consulting Physician (General Surgery)  Inpatient Treatment Team: Treatment Team: Attending Provider: Michael Boston, MD; Utilization Review: Micah Noel, RN; Pharmacist: Polly Cobia, [facility]; Social Worker: Rexene Alberts; Registered Nurse: Audrea Muscat, RN   Problem List:   Principal Problem:   Perforation of gallbladder in cholecystitis   1 Day Post-Op  05/31/2021  POST-OPERATIVE DIAGNOSIS:    History of perforated gallbladder with giant abscesses status post percutaneous drainage November 2022 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   OR FINDINGS: Patient had extremely dense concrete adhesions of the right upper quadrant with extremely poor planes.  Transverse colon, mesocolon, omentum, and especially duodenal blub completely walling off the gallbladder with concrete adhesions.  Gallbladder necrotic with chronic clear colorless mucus within it and numerous medium size stones.  Not able to completely come around the gallbladder.  Resected most of the dome anterior wall and some of the posterior wall.  Old percutaneous drain removed.  Kendal Hymen of the surgical drain in place   Liver: Fatty change in the liver quite friable.    Assessment  Somewhat fair but with moderate pain  [facility] Stay = 0 days)  Plan:  -Improve  pain control.  Advance diet.  Stop IV fluids.  Drain care teaching.  She has been used her drain for several months.  This was slightly different.  No flushing.  Check drain for bilirubin level  Hypomagnesemia.  Replace.  Diabetic control sliding scale insulin for now.  GERD.  PPI  -VTE prophylaxis- SCDs, etc  -mobilize as tolerated to help recovery  Possible discharge home later today if feeling more comfortable and meets goals.  Otherwise low threshold to keep another day  Disposition:  Disposition:  The patient is from: Home  Anticipate discharge to:  Home  Anticipated Date of Discharge is:  February 9,2023    Barriers to discharge:  Pending Clinical improvement (more likely than not)  Patient currently is NOT MEDICALLY STABLE for discharge from the hospital from a surgery standpoint.      25 minutes spent in review, evaluation, examination, counseling, and coordination of care.   I have reviewed this patient's available data, including medical history, events of note, physical examination and test results as part of my evaluation.  A significant portion of that time was spent in counseling.  Care during the described time interval was provided by me.  06/01/2021    Subjective: (Chief complaint)  Patient with moderate soreness requiring IV medications last night.  Mild nausea but tolerating some liquids.  Nursing just outside room  Objective:  Vital signs:  Vitals:   05/31/21 2118 06/01/21 0152 06/01/21 0653 06/01/21 0957  BP: 121/74 112/77 110/72 106/76  Pulse: 97 84 100 (!) 102  Resp: 16 17 16 20   Temp: 98.8 F (37.1 C) 100.1 F (37.8 C) 99.9 F (37.7 C) (!) 100.7 F (38.2 C)  TempSrc: Oral Oral Oral Oral  SpO2:  98% 97% 98% 96%  Weight:      Height:        Last BM Date: 05/30/21  Intake/Output   Yesterday:  02/02 0701 - 02/03 0700 In: 5117.3 [P.O.:780; I.V.:4087.3; IV Piggyback:250] Out: 886 [Urine:1; Drains:375; Blood:400] This  shift:  No intake/output data recorded.  Bowel function:  Flatus: YES  BM:  No  Drain: Serosanguinous   Physical Exam:  General: Pt awake/alert in mild acute distress Eyes: PERRL, normal EOM.  Sclera clear.  No icterus Neuro: CN II-XII intact w/o focal sensory/motor deficits. Lymph: No head/neck/groin lymphadenopathy Psych:  No delerium/psychosis/paranoia.  Oriented x 4 HENT: Normocephalic, Mucus membranes moist.  No thrush Neck: Supple, No tracheal deviation.  No obvious thyromegaly Chest: No pain to chest wall compression.  Good respiratory excursion.  No audible wheezing CV:  Pulses intact.  Regular rhythm.  No major extremity edema MS: Normal AROM mjr joints.  No obvious deformity  Abdomen: Soft.  Nondistended.  Mildly tender at incisions only.  No evidence of peritonitis.  No incarcerated hernias.  Ext:   No deformity.  No mjr edema.  No cyanosis Skin: No petechiae / purpurea.  No major sores.  Warm and dry    Results:   Cultures: No results found for this or any previous visit (from the past 720 hour(s)).  Labs: Results for orders placed or performed during the hospital encounter of 05/31/21 (from the past 48 hour(s))  Glucose, capillary     Status: Abnormal   Collection Time: 05/31/21  2:30 PM  Result Value Ref Range   Glucose-Capillary 210 (H) 70 - 99 mg/dL    Comment: Glucose reference range applies only to samples taken after fasting for at least 8 hours.  Glucose, capillary     Status: Abnormal   Collection Time: 05/31/21  9:19 PM  Result Value Ref Range   Glucose-Capillary 200 (H) 70 - 99 mg/dL    Comment: Glucose reference range applies only to samples taken after fasting for at least 8 hours.  Basic metabolic panel     Status: Abnormal   Collection Time: 06/01/21  3:20 AM  Result Value Ref Range   Sodium 134 (L) 135 - 145 mmol/L   Potassium 4.0 3.5 - 5.1 mmol/L   Chloride 98 98 - 111 mmol/L   CO2 29 22 - 32 mmol/L   Glucose, Bld 160 (H) 70 - 99 mg/dL     Comment: Glucose reference range applies only to samples taken after fasting for at least 8 hours.   BUN 13 8 - 23 mg/dL   Creatinine, Ser 0.58 0.44 - 1.00 mg/dL   Calcium 8.7 (L) 8.9 - 10.3 mg/dL   GFR, Estimated >60 >60 mL/min    Comment: (NOTE) Calculated using the CKD-EPI Creatinine Equation (2021)    Anion gap 7 5 - 15    Comment: Performed at [facility], [place] [address] [place], [place] [address]  Magnesium     Status: Abnormal   Collection Time: 06/01/21  3:20 AM  Result Value Ref Range   Magnesium 1.6 (L) 1.7 - 2.4 mg/dL    Comment: Performed at [facility], [place] [address], [place], [place] [address]  CBC     Status: Abnormal   Collection Time: 06/01/21  3:20 AM  Result Value Ref Range   WBC 11.7 (H) 4.0 - 10.5 K/uL   RBC 3.54 (L) 3.87 - 5.11 MIL/uL   Hemoglobin 10.8 (L) 12.0 - 15.0 g/dL  HCT 33.5 (L) 36.0 - 46.0 %   MCV 94.6 80.0 - 100.0 fL   MCH 30.5 26.0 - 34.0 pg   MCHC 32.2 30.0 - 36.0 g/dL   RDW 13.0 11.5 - 15.5 %   Platelets 246 150 - 400 K/uL   nRBC 0.0 0.0 - 0.2 %    Comment: Performed at [facility], [place] [address], [place], [place] [address]  Hepatic function panel     Status: Abnormal   Collection Time: 06/01/21  3:20 AM  Result Value Ref Range   Total Protein 5.5 (L) 6.5 - 8.1 g/dL   Albumin 3.1 (L) 3.5 - 5.0 g/dL   AST 96 (H) 15 - 41 U/L   ALT 88 (H) 0 - 44 U/L   Alkaline Phosphatase 61 38 - 126 U/L   Total Bilirubin 0.4 0.3 - 1.2 mg/dL   Bilirubin, Direct 0.1 0.0 - 0.2 mg/dL   Indirect Bilirubin 0.3 0.3 - 0.9 mg/dL    Comment: Performed at [facility], [place] [address], [place], [place] [address]  Glucose, capillary     Status: Abnormal   Collection Time: 06/01/21  7:49 AM  Result Value Ref Range   Glucose-Capillary 133 (H) 70 - 99 mg/dL    Comment: Glucose reference range applies only to samples taken after fasting for at least 8 hours.  Glucose, capillary      Status: Abnormal   Collection Time: 06/01/21 11:30 AM  Result Value Ref Range   Glucose-Capillary 154 (H) 70 - 99 mg/dL    Comment: Glucose reference range applies only to samples taken after fasting for at least 8 hours.   Comment 1 QC Due     Imaging / Studies: No results found.  Medications / Allergies: per chart  Antibiotics: Anti-infectives (From admission, onward)    Start     Dose/Rate Route Frequency Ordered Stop   05/31/21 0945  cefTRIAXone (ROCEPHIN) 2 g in sodium chloride 0.9 % 100 mL IVPB       See Hyperspace for full Linked Orders Report.   2 g 200 mL/hr over 30 Minutes Intravenous On call to O.R. 05/31/21 9518 05/31/21 1211   05/31/21 0945  metroNIDAZOLE (FLAGYL) IVPB 500 mg       See Hyperspace for full Linked Orders Report.   500 mg 100 mL/hr over 60 Minutes Intravenous On call to O.R. 05/31/21 8416 05/31/21 1223         Note: Portions of this report may have been transcribed using voice recognition software. Every effort was made to ensure accuracy; however, inadvertent computerized transcription errors may be present.   Any transcriptional errors that result from this process are unintentional.    [name], MD, FACS, MASCRS Esophageal, Gastrointestinal & Colorectal Surgery Robotic and Minimally Invasive Surgery  [facility], [place]. [address] [place], [place] [address] [phone] Fax [phone] Main  CONTACT INFORMATION:  Weekday (9AM-5PM): Call [facility] main office at [phone]  Weeknight (5PM-9AM) or Weekend/Holiday: Check [email or web address] (password " [email or web address]") for General Surgery [facility] coverage  (Please, do not use SecureChat as it is not reliable communication to operating surgeons for immediate patient care)      06/01/2021  11:52 AM

## 2021-06-01 NOTE — Progress Notes (Signed)
Note: Portions of this report may have been transcribed using voice recognition software. A sincere effort was made to ensure accuracy; however, inadvertent computerized transcription errors may be present.   Any transcriptional errors that result from this process are unintentional.        Sandra [name]  July 28, 1960 [id number]  Patient Care Team: [name], MD as PCP - General (Internal Medicine) Arbutus Leas, PhD as Consulting Physician (Psychology) Tania Ade, MD as Consulting Physician (Orthopedic Surgery) Gatha Mayer, MD as Consulting Physician (Gastroenterology) Knox Royalty, RN as Case Manager Michael Boston, MD as Consulting Physician (General Surgery)  Discussed case with Dr. Kaylyn Lim with our group given the difficult surgery.  He agreed with the plan of continuing drain and HIDA scan in 2 weeks.  If need for ERCP can try and set up lap assisted transgastric ERCP through gastric remnant if needed.  Try and hold off since no major leak noted yet.  Called and had discussion with patient's nurse, Jovita Kussmaul.  Patient feeling better with the oxycodone and pain adjustments.  Walking hallways and tolerating solid diet.  Drain fluid for bilirubin sent off but not back yet.  Vital signs seem better.  We will watch her overnight and check her numbers.  Hopefully if she is feeling more comfortable can transition out of the hospital this weekend.  We will see.  Patient Active Problem List   Diagnosis Date Noted   Perforation of gallbladder in cholecystitis 05/31/2021   Gallbladder abscess 05/02/2021   Cholelithiasis 04/06/2021   Abscess 04/06/2021   Intra-abdominal abscess ([place]) 03/06/2021   Normocytic anemia 03/06/2021   Hypokalemia 03/06/2021   Hyponatremia 03/06/2021   Thrombocytosis 03/06/2021   Moderate protein-calorie malnutrition ([place]) 03/06/2021   Abdominal pain 02/17/2021   Right carpal tunnel syndrome 11/10/2020   Labral tear of right hip  joint 11/10/2020   Chronic right hip pain 04/10/2020   Nonallopathic lesion of sacral region 08/05/2019   Greater trochanteric bursitis, right 08/05/2019   Benign neoplasm of descending colon    Low back pain 05/27/2019   Hematoma of right hip 05/07/2019   Trigger finger, right index finger 02/17/2019   Anxiety 11/12/2018   Left lateral epicondylitis 05/14/2018   Left elbow pain 05/04/2018   Right rotator cuff tear 08/20/2017   Cervical disc disorder with radiculopathy of cervical region 04/25/2016   Nonallopathic lesion of cervical region 04/17/2016   Nonallopathic lesion of thoracic region 04/17/2016   Nonallopathic lesion of rib cage 04/17/2016   Nonallopathic lesion of lumbosacral region 04/17/2016   Degenerative cervical disc 03/29/2016   Degenerative arthritis of right knee 03/29/2016   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   Adjustment disorder with mixed anxiety and depressed mood 07/14/2014   Personal history of colonic polyps    History of Roux-en-Y gastric bypass, 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   Chronic constipation 03/05/2013   Coronary atherosclerosis of native coronary artery 08/27/2011   History of uterine cancer 08/15/2011   Type 2 diabetes mellitus ([place]) 08/15/2011   Hypercholesterolemia 08/15/2011   History of colonic polyps 12/29/2009    Past Medical History:  Diagnosis Date   Anxiety    Arthritis    CAD (coronary artery disease)    nonobstructive CAD in LAD per 2013 CTA coronary morph   Cholelithiasis 04/06/2021   Depression    Diabetes mellitus    no meds taken now, checks cbg once or twice a week    Difficult intubation    had to use the glidescope due  to small airway    Gallbladder abscess 05/02/2021   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 HYSTERECTOMY   STAGE I ENDOMETROID CARCINOMA   History of kidney stones    Hx of adenomatous colonic polyps 2011   BENIGN   Hyperlipidemia    no meds now   Hypertension    Pneumonia    Sleep apnea    no CPAP since surgery    Past Surgical History:  Procedure Laterality Date   ABDOMINAL HYSTERECTOMY  12-20-2004   TAH   BILATERAL BREAST REDUCTION  11-16-1999   BIOPSY  02/20/2021   Procedure: BIOPSY;  Surgeon: Daryel November, MD;  Location: [facility] ENDOSCOPY;  Service: Gastroenterology;;   Rosanne Gutting PYLORI N/A 05/27/2013   Procedure: Lauris Chroman;  Surgeon: Shann Medal, MD;  Location: [facility];  Service: General;  Laterality: N/A;   [place] N/A 05/31/2021   Procedure: LAPAROSCOPIC LYSIS OF ADHESIONS, PARTIAL CHOLECYSTECTOMY, DRAIN PLACEMENT, WEDGE LIVER BIOPSY;  Surgeon: Michael Boston, MD;  Location: [facility] ORS;  Service: General;  Laterality: N/A;   COLONOSCOPY     COLONOSCOPY N/A 04/25/2014   Procedure: COLONOSCOPY;  Surgeon: Gatha Mayer, MD;  Location: [facility] ENDOSCOPY;  Service: Endoscopy;  Laterality: N/A;   COLONOSCOPY WITH PROPOFOL N/A 07/26/2019   Procedure: COLONOSCOPY WITH PROPOFOL;  Surgeon: Gatha Mayer, MD;  Location: [facility] ENDOSCOPY;  Service: Endoscopy;  Laterality: N/A;   CYSTOSCOPY  09/06/2011   Procedure: CYSTOSCOPY;  Surgeon: Ailene Rud, MD;  Location: [facility];  Service: Urology;  Laterality: N/A;  LYNX SLING    ESOPHAGOGASTRODUODENOSCOPY (EGD) WITH PROPOFOL N/A 09/27/2014   Procedure: ESOPHAGOGASTRODUODENOSCOPY (EGD) WITH PROPOFOL;  Surgeon: Gatha Mayer, MD;  [facility] END[facility];  Service: Endoscopy;  Laterality: N/A;   ESOPHAGOGASTRODUODENOSCOPY (EGD) WITH PROPOFOL N/A 03/11/2016   Procedure: ESOPHAGOGASTRODUODENOSCOPY (EGD) WITH PROPOFOL;  Surgeon: Jerene Bears,  MD;  [facility];  Service: Endoscopy;  Laterality: N/A;   ESOPHAGOGASTRODUODENOSCOPY (EGD) WITH PROPOFOL Left 02/20/2021   Procedure: ESOPHAGOGASTRODUODENOSCOPY (EGD) WITH PROPOFOL;  Surgeon: Daryel November, MD;  Location: [facility];  Service: Gastroenterology;  Laterality: Left;   GASTRIC BYPASS  10/04/2013   GASTRIC ROUX-EN-Y N/A 10/04/2013   Procedure: LAPAROSCOPIC ROUX-EN-Y GASTRIC BYPASS WITH UPPER ENDOSCOPY;  Surgeon: Shann Medal, MD;  Location: [facility];  Service: General;  Laterality: N/A;   HEMORRHOID BANDING  02/2013   IR CHOLANGIOGRAM EXISTING TUBE  05/01/2021   IR PERC CHOLECYSTOSTOMY  03/06/2021   IR RADIOLOGIST EVAL & MGMT  04/12/2021   POLYPECTOMY     POLYPECTOMY  07/26/2019   Procedure: POLYPECTOMY;  Surgeon: Gatha Mayer, MD;  [facility];  Service: Endoscopy;;   PUBOVAGINAL SLING  09/06/2011   Procedure: Gaynelle Arabian;  Surgeon: Ailene Rud, MD;  [facility];  Service: Urology;  Laterality: N/A;   REDUCTION MAMMAPLASTY Bilateral 2001   SHOULDER ARTHROSCOPY Right 10/23/2017   Procedure: RIGHT SHOULDER ARTHROSCOPIC ROTATOR CUFF REPAIR VS. 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[employer]   Social Determinants of Health   Financial Resource Strain: Not on file  Food Insecurity: No Food Insecurity   Worried About Charity fundraiser in the Last Year: Never true   Arboriculturist in the Last Year: Never true  Transportation Needs: No Transportation Needs   Lack of Transportation (Medical): No   Lack of Transportation (Non-Medical): No  Physical Activity: Not on file  Stress: Not on file  Social Connections: Not on file  Intimate Partner Violence: Not on file    Family History  Adopted: Yes    Current Facility-Administered Medications  Medication Dose Route Frequency Provider Last Rate Last Admin   0.9 %  sodium chloride infusion  250 mL Intravenous PRN Michael Boston, MD       acetaminophen (TYLENOL) tablet 1,000 mg  1,000 mg Oral Lajuana Ripple, MD   1,000 mg at 06/01/21 1033   bisacodyl (DULCOLAX) suppository 10 mg  10 mg Rectal Daily PRN Michael Boston, MD       diphenhydrAMINE (BENADRYL) 12.5 MG/5ML elixir 12.5 mg  12.5 mg Oral Q6H PRN Michael Boston, MD       Or   diphenhydrAMINE (BENADRYL) injection 12.5 mg  12.5 mg Intravenous Q6H PRN Michael Boston, MD       enalaprilat (VASOTEC) injection 0.625-1.25 mg  0.625-1.25 mg Intravenous Q6H PRN Michael Boston, MD       feeding supplement (GLUCERNA SHAKE) (GLUCERNA SHAKE) liquid 237 mL  237 mL Oral BID BM Michael Boston, MD   237 mL at 06/01/21 1407   gabapentin (NEURONTIN) capsule 200 mg  200 mg Oral TID Michael Boston, MD   200 mg at 06/01/21 1204   HYDROmorphone (DILAUDID) injection 0.5-2 mg  0.5-2 mg Intravenous Q2H PRN Michael Boston, MD       insulin aspart (novoLOG) injection 0-15 Units  0-15 Units Subcutaneous TID WC Michael Boston, MD       insulin aspart  (novoLOG) injection 0-5 Units  0-5 Units Subcutaneous QHS Michael Boston, MD       lactated ringers bolus 1,000 mL  1,000 mL Intravenous Q8H PRN Brently Voorhis, Remo Lipps, MD       lip balm (CARMEX) ointment 1 application  1 application Topical BID Michael Boston, MD   1 application at 60/10/93 2235   magic mouthwash  15 mL Oral QID PRN Michael Boston, MD       magnesium hydroxide (MILK OF MAGNESIA) suspension  30 mL  30 mL Oral Daily PRN Michael Boston, MD       methocarbamol (ROBAXIN) 1,000 mg in dextrose 5 % 100 mL IVPB  1,000 mg Intravenous Q6H PRN Michael Boston, MD       methocarbamol (ROBAXIN) tablet 500-1,000 mg  500-1,000 mg Oral Q6H PRN Michael Boston, MD   1,000 mg at 06/01/21 1505   metoprolol tartrate (LOPRESSOR) injection 5 mg  5 mg Intravenous Q6H PRN Michael Boston, MD       ondansetron [facility]) injection 4 mg  4 mg Intravenous Q6H PRN Armandina Gemma, MD   4 mg at 06/01/21 0247   oxyCODONE (Oxy IR/ROXICODONE) immediate release tablet 5-10 mg  5-10 mg Oral Q4H PRN Michael Boston, MD   10 mg at 06/01/21 1505   pantoprazole (PROTONIX) EC tablet 40 mg  40 mg Oral Q1200 Michael Boston, MD   40 mg at 06/01/21 1205   polycarbophil (FIBERCON) tablet 625 mg  625 mg Oral BID Michael Boston, MD   625 mg at 06/01/21 1204   simethicone (MYLICON) chewable tablet 40 mg  40 mg Oral Q6H PRN Michael Boston, MD       sodium chloride flush (NS) 0.9 % injection 3 mL  3 mL Intravenous Gorden Harms, MD   3 mL at 06/01/21 1212   sodium chloride flush (NS) 0.9 % injection 3 mL  3 mL Intravenous PRN Michael Boston, MD       traMADol Veatrice Bourbon) tablet 50-100 mg  50-100 mg Oral Q6H PRN Michael Boston, MD         Allergies  Allergen Reactions   Nsaids     r/t gastric surgery   Statins     Muscle aches    BP 95/67 (BP Location: Right Arm)    Pulse 89    Temp 98.2 F (36.8 C) (Oral)    Resp 20    Ht 5\' 2"  (1.575 m)    Wt 61.7 kg    SpO2 94%    BMI 24.87 kg/m   No results found.

## 2021-06-01 NOTE — Progress Notes (Signed)
°  Transition of Care [facility]) Screening Note   Patient Details  Name: Sandra [name] Date of Birth: Oct 03, 1960   Transition of Care [facility]) CM/SW Contact:    Lennart Pall, LCSW Phone Number: 06/01/2021, 11:21 AM    Transition of Care Department [facility]) has reviewed patient and no TOC needs have been identified at this time. We will continue to monitor patient advancement through interdisciplinary progression rounds. If new patient transition needs arise, please place a TOC consult.

## 2021-06-01 NOTE — Plan of Care (Signed)
Plan of care reviewed and discussed with the patient. 

## 2021-06-02 ENCOUNTER — Other Ambulatory Visit (HOSPITAL_COMMUNITY): Payer: Self-pay

## 2021-06-02 DIAGNOSIS — K839 Disease of biliary tract, unspecified: Secondary | ICD-10-CM

## 2021-06-02 DIAGNOSIS — K760 Fatty (change of) liver, not elsewhere classified: Secondary | ICD-10-CM

## 2021-06-02 DIAGNOSIS — K82A2 Perforation of gallbladder in cholecystitis: Secondary | ICD-10-CM

## 2021-06-02 LAB — CBC
HCT: 32.2 % — ABNORMAL LOW (ref 36.0–46.0)
Hemoglobin: 10.2 g/dL — ABNORMAL LOW (ref 12.0–15.0)
MCH: 30.5 pg (ref 26.0–34.0)
MCHC: 31.7 g/dL (ref 30.0–36.0)
MCV: 96.4 fL (ref 80.0–100.0)
Platelets: 221 10*3/uL (ref 150–400)
RBC: 3.34 MIL/uL — ABNORMAL LOW (ref 3.87–5.11)
RDW: 13.2 % (ref 11.5–15.5)
WBC: 12.8 10*3/uL — ABNORMAL HIGH (ref 4.0–10.5)
nRBC: 0 % (ref 0.0–0.2)

## 2021-06-02 LAB — HEPATIC FUNCTION PANEL
ALT: 59 U/L — ABNORMAL HIGH (ref 0–44)
AST: 34 U/L (ref 15–41)
Albumin: 2.8 g/dL — ABNORMAL LOW (ref 3.5–5.0)
Alkaline Phosphatase: 63 U/L (ref 38–126)
Bilirubin, Direct: <0.1 mg/dL (ref 0.0–0.2)
Total Bilirubin: 0.5 mg/dL (ref 0.3–1.2)
Total Protein: 5.6 g/dL — ABNORMAL LOW (ref 6.5–8.1)

## 2021-06-02 LAB — PHOSPHORUS: Phosphorus: 2.4 mg/dL — ABNORMAL LOW (ref 2.5–4.6)

## 2021-06-02 LAB — GLUCOSE, CAPILLARY
Glucose-Capillary: 169 mg/dL — ABNORMAL HIGH (ref 70–99)
Glucose-Capillary: 134 mg/dL — ABNORMAL HIGH (ref 70–99)
Glucose-Capillary: 107 mg/dL — ABNORMAL HIGH (ref 70–99)
Glucose-Capillary: 148 mg/dL — ABNORMAL HIGH (ref 70–99)

## 2021-06-02 LAB — PREALBUMIN: Prealbumin: 8.7 mg/dL — ABNORMAL LOW (ref 18–38)

## 2021-06-02 LAB — POTASSIUM: Potassium: 3.5 mmol/L (ref 3.5–5.1)

## 2021-06-02 LAB — CREATININE, SERUM
Creatinine, Ser: 0.56 mg/dL (ref 0.44–1.00)
GFR, Estimated: >60 mL/min (ref >60)

## 2021-06-02 MED ORDER — MAGNESIUM SULFATE 2 GM/50ML IV SOLN
2.0000 g | Freq: Once | INTRAVENOUS | Status: AC
  Administered 2021-06-02: 2 g via INTRAVENOUS
  Filled 2021-06-02: qty 50

## 2021-06-02 MED ORDER — PIPERACILLIN-TAZOBACTAM 3.375 G IVPB
3.3750 g | Freq: Three times a day (TID) | INTRAVENOUS | Status: DC
  Administered 2021-06-02 – 2021-06-05 (×9): 3.375 g via INTRAVENOUS
  Filled 2021-06-02 (×11): qty 50

## 2021-06-02 MED ORDER — BISACODYL 10 MG RE SUPP
10.0000 mg | Freq: Once | RECTAL | Status: AC
  Administered 2021-06-02: 10 mg via RECTAL
  Filled 2021-06-02: qty 1

## 2021-06-02 MED ORDER — POLYETHYLENE GLYCOL 3350 17 G PO PACK
17.0000 g | PACK | Freq: Two times a day (BID) | ORAL | Status: DC
  Administered 2021-06-02 – 2021-06-03 (×2): 17 g via ORAL
  Filled 2021-06-02 (×2): qty 1

## 2021-06-02 MED ORDER — GABAPENTIN 300 MG PO CAPS
300.0000 mg | ORAL_CAPSULE | Freq: Three times a day (TID) | ORAL | Status: DC
  Administered 2021-06-02 – 2021-06-05 (×9): 300 mg via ORAL
  Filled 2021-06-02 (×9): qty 1

## 2021-06-02 MED ORDER — POLYETHYLENE GLYCOL 3350 17 G PO PACK
34.0000 g | PACK | Freq: Once | ORAL | Status: AC
  Administered 2021-06-02: 34 g via ORAL
  Filled 2021-06-02: qty 2

## 2021-06-02 NOTE — Progress Notes (Addendum)
Was able to reach Dr. Lorenso Courier with [facility] gastroenterology.  She will discuss with her colleagues to see if possible combined ERCP through the gastric remnant can be done sometime next week in the hopes of controlling this versus considering delaying and doing it in the near future.  Will place on IV antibiotics and see how the patient does.

## 2021-06-02 NOTE — Progress Notes (Signed)
Sandra [name] [id number] 1960/06/02  CARE TEAM:  PCP: [name], MD  Outpatient Care Team: Patient Care Team: [name], MD as PCP - General (Internal Medicine) Arbutus Leas, PhD as Consulting Physician (Psychology) Tania Ade, MD as Consulting Physician (Orthopedic Surgery) Gatha Mayer, MD as Consulting Physician (Gastroenterology) Knox Royalty, RN as Case Manager Michael Boston, MD as Consulting Physician (General Surgery)  Inpatient Treatment Team: Treatment Team: Attending Provider: Michael Boston, MD; Utilization Review: Conception Oms, RN; Registered Nurse: Fredderick Erb, RN   Problem List:   Principal Problem:   Perforation of gallbladder in cholecystitis   2 Days Post-Op  05/31/2021  POST-OPERATIVE DIAGNOSIS:    History of perforated gallbladder with giant abscesses status post percutaneous drainage November 2022 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   OR FINDINGS: Patient had extremely dense concrete adhesions of the right upper quadrant with extremely poor planes.  Transverse colon, mesocolon, omentum, and especially duodenal blub completely walling off the gallbladder with concrete adhesions.  Gallbladder necrotic with chronic clear colorless mucus within it and numerous medium size stones.  Not able to completely come around the gallbladder.  Resected most of the dome anterior wall and some of the posterior wall.  Old percutaneous drain removed.  Kendal Hymen of the surgical drain in place   Liver: Fatty change in the liver quite friable.    Assessment  Somewhat fair but with moderate pain  [facility] Stay = 1 days)  Plan:  -Improve pain control. -Somewhat better today.  Increase gabapentin.   Continue oxycodone  Try more aggressive bowel regimen.  Follow closely.  Very high risk for ileus given her severe cholecystitis with chronic perforation and prior abscesses  Follow on solid diet.   Follow off IV fluids.  Drain care teaching.  She has been used her drain for several months.  This was slightly different.  No flushing.  Check drain for bilirubin level  Will reach out to gastroenterology.  I worry she may be having a bile leak.  See if it is feasible to try and do a transgastric ERCP while she is here versus delaying and scheduling in the future.  Depend how she does.  Hypomagnesemia.  Replace.  Diabetic control - sliding scale insulin for now.  GERD.  PPI  -VTE prophylaxis- SCDs, etc  -mobilize as tolerated to help recovery    Disposition:  Disposition:  The patient is from: Home  Anticipate discharge to:  Home  Anticipated Date of Discharge is:  February 7,2023    Barriers to discharge:  Pending Clinical improvement (more likely than not)  Patient currently is NOT MEDICALLY STABLE for discharge from the hospital from a surgery standpoint.      25 minutes spent in review, evaluation, examination, counseling, and coordination of care.   I have reviewed this patient's available data, including medical history, events of note, physical examination and test results as part of my evaluation.  A significant portion of that time was spent in counseling.  Care during the described time interval was provided by me.  06/02/2021    Subjective: (Chief complaint)  Patient with still some soreness.  Oxycodone helps.  Walking around but needing help.  Having flatus but no bowel movement.  Feels more distended.  Objective:  Vital signs:  Vitals:   06/01/21 1817 06/01/21 2230 06/02/21 0547 06/02/21 0710  BP: 105/62 119/72 139/80   Pulse: 95 94 (!) 103   Resp: 20 17 17    Temp: (!) 100.4 F (38 C) (!) 100.4 F (38 C) 99.3 F (37.4 C)   TempSrc: Oral  Oral    SpO2: 94% 100% 94%   Weight:    61.6 kg  Height:    5\' 2"  (1.575 m)    Last BM Date: 05/30/21  Intake/Output   Yesterday:  02/03 0701 - 02/04 0700 In: 600 [P.O.:600] Out: 195 [Drains:195] This shift:  No intake/output data recorded.  Bowel function:  Flatus: YES  BM:  No  Drain: Dark red output with possible bile tinged.  Not frothy like frank bile.   Physical Exam:  General: Pt awake/alert in no acute distress.  Tired but not toxic Eyes: PERRL, normal EOM.  Sclera clear.  No icterus Neuro: CN II-XII intact w/o focal sensory/motor deficits. Lymph: No head/neck/groin lymphadenopathy Psych:  No delerium/psychosis/paranoia.  Oriented x 4 HENT: Normocephalic, Mucus membranes moist.  No thrush Neck: Supple, No tracheal deviation.  No obvious thyromegaly Chest: No pain to chest wall compression.  Good respiratory excursion.  No audible wheezing CV:  Pulses intact.  Regular rhythm.  No major extremity edema MS: Normal AROM mjr joints.  No obvious deformity  Abdomen: Somewhat firm.  Moderately distended.  Mildly tender at incisions only.  No evidence of peritonitis.  No incarcerated hernias.  Ext:   No deformity.  No mjr edema.  No cyanosis Skin: No petechiae / purpurea.  No major sores.  Warm and dry    Results:   Cultures: No results found for this or any previous visit (from the past 720 hour(s)).  Labs: Results for orders placed or performed during the hospital encounter of 05/31/21 (from the past 48 hour(s))  Surgical pathology     Status: None   Collection Time: 05/31/21  1:56 PM  Result Value Ref Range   SURGICAL PATHOLOGY      SURGICAL PATHOLOGY CASE: [id number] PATIENT: Sandra [name] Surgical Pathology Report     Clinical History: Symptomatic biliary colic (crm)     FINAL MICROSCOPIC DIAGNOSIS:  A. LIVER, BIOPSY: - Liver parenchyma with mild steatosis. See comment - No pathologic fibrosis  B. GALLBLADDER WALL WITH STONES: - Acute  and chronic cholecystitis and cholelithiasis      COMMENT:  The biopsy shows subcapsular liver parenchyma with a preserved architecture. Overall, portal tracts alternate fairly regularly with central veins and normally radiating hepatocellular trabeculae. Portal tracts are compact and are generally devoid of an inflammatory cell infiltrate. Bile ducts are seen in nearly all portal tracts and are histologically unremarkable. Portal vascular structures are histologically unremarkable. Hepatic lobules show mild, mostly large droplet macrovesicular steatosis without evidence of steatohepatitis. There is no cholestasis or significant inflammation.  Central veins are unremarkable. Trichrome and reticulin stains show no evidence of pathologic fibrosis. Iron stain shows focal granular iron stain deposition within hepatocytes (2-9+) of uncertain significance.  PASD stain is negative for globular intracytoplasmic inclusions.     [place] DESCRIPTION:  A: The specimen is received fresh and consists of a 1.2 x 0.9 x 0.8 cm piece of tan-red, cauterized soft tissue.  The specimen is bisected and entirely submitted in 1 cassette.  B: The specimen is received fresh and consists of a 4.6 x 2.6 x 1.2 cm aggregate of pink-red, hyperemic soft tissue, and multiple black, ovoid calculi ranging from 0.5  to 0.7 cm in greatest dimension.  Soft tissue is roughened, with possible liver parenchyma.  Representative sections are submitted in 2 cassettes.  Craig Staggers 05/31/2021)    Final Diagnosis performed by Jaquita Folds, MD.   Electronically signed 06/01/2021 Technical and / or Professional components performed at [facility], [place] [address] [place], [place] [address].  Immunohistochemistry Technical component (if applicable) was performed at [facility]. [address] [place], [place], [place] [address].   IMMUNOHISTOCHEMISTRY DISCLAIMER (if applicable): Some of  these immunohistochemical stains may have been developed and the performance characteristics determine by [facility]. Some may not have been cleared or approved by the U.S. Food and Drug Administration. The FDA has determined that such clearance or approval is not necessary. This test is used for clinical purposes. It should not be regarded as investigational or for research. This laboratory is certified under the [place] (CLIA-88) as qualified to perform high complexity clinical laboratory testing.  The controls stained appropriately.   Glucose, capillary     Status: Abnormal   Collection Time: 05/31/21  2:30 PM  Result Value Ref Range   Glucose-Capillary 210 (H) 70 - 99 mg/dL    Comment: Glucose reference range applies only to samples taken after fasting for at least 8 hours.  Glucose, capillary     Status: Abnormal   Collection Time: 05/31/21  9:19 PM  Result Value Ref Range   Glucose-Capillary 200 (H) 70 - 99 mg/dL    Comment: Glucose reference range applies only to samples taken after fasting for at least 8 hours.  Basic metabolic panel     Status: Abnormal   Collection Time: 06/01/21  3:20 AM  Result Value Ref Range   Sodium 134 (L) 135 - 145 mmol/L   Potassium 4.0 3.5 - 5.1 mmol/L   Chloride 98 98 - 111 mmol/L   CO2 29 22 - 32 mmol/L   Glucose, Bld 160 (H) 70 - 99 mg/dL    Comment: Glucose reference range applies only to samples taken after fasting for at least 8 hours.   BUN 13 8 - 23 mg/dL   Creatinine, Ser 0.58 0.44 - 1.00 mg/dL   Calcium 8.7 (L) 8.9 - 10.3 mg/dL   GFR, Estimated >60 >60 mL/min    Comment: (NOTE) Calculated using the CKD-EPI Creatinine Equation (2021)    Anion gap 7 5 - 15    Comment: Performed at [facility], [place] [address], [place], [place] [address]  Magnesium     Status: Abnormal   Collection Time: 06/01/21  3:20 AM  Result Value Ref Range   Magnesium 1.6 (L) 1.7 - 2.4  mg/dL    Comment: Performed at [facility], [place] [address], [place], [place] [address]  CBC     Status: Abnormal   Collection Time: 06/01/21  3:20 AM  Result Value Ref Range   WBC 11.7 (H) 4.0 - 10.5 K/uL   RBC 3.54 (L) 3.87 - 5.11 MIL/uL   Hemoglobin 10.8 (L) 12.0 - 15.0 g/dL   HCT 33.5 (L) 36.0 - 46.0 %   MCV 94.6 80.0 - 100.0 fL   MCH 30.5 26.0 - 34.0 pg   MCHC 32.2 30.0 - 36.0 g/dL   RDW 13.0 11.5 - 15.5 %   Platelets 246 150 - 400 K/uL   nRBC 0.0 0.0 - 0.2 %    Comment: Performed at [facility], [place]  [address], [place], [place] [address]  Hepatic function panel     Status: Abnormal   Collection Time: 06/01/21  3:20 AM  Result Value Ref Range   Total Protein 5.5 (L) 6.5 - 8.1 g/dL   Albumin 3.1 (L) 3.5 - 5.0 g/dL   AST 96 (H) 15 - 41 U/L   ALT 88 (H) 0 - 44 U/L   Alkaline Phosphatase 61 38 - 126 U/L   Total Bilirubin 0.4 0.3 - 1.2 mg/dL   Bilirubin, Direct 0.1 0.0 - 0.2 mg/dL   Indirect Bilirubin 0.3 0.3 - 0.9 mg/dL    Comment: Performed at [facility], [place] [address] , [place] [address]  Glucose, capillary     Status: Abnormal   Collection Time: 06/01/21  7:49 AM  Result Value Ref Range   Glucose-Capillary 133 (H) 70 - 99 mg/dL    Comment: Glucose reference range applies only to samples taken after fasting for at least 8 hours.  Glucose, capillary     Status: Abnormal   Collection Time: 06/01/21 11:30 AM  Result Value Ref Range   Glucose-Capillary 154 (H) 70 - 99 mg/dL    Comment: Glucose reference range applies only to samples taken after fasting for at least 8 hours.   Comment 1 QC Due   Glucose, capillary     Status: Abnormal   Collection Time: 06/01/21  3:45 PM  Result Value Ref Range   Glucose-Capillary 135 (H) 70 - 99 mg/dL    Comment: Glucose reference range applies only to samples taken after fasting for at least 8 hours.  Glucose, capillary     Status: Abnormal   Collection Time: 06/01/21   9:24 PM  Result Value Ref Range   Glucose-Capillary 140 (H) 70 - 99 mg/dL    Comment: Glucose reference range applies only to samples taken after fasting for at least 8 hours.  CBC     Status: Abnormal   Collection Time: 06/02/21  3:14 AM  Result Value Ref Range   WBC 12.8 (H) 4.0 - 10.5 K/uL   RBC 3.34 (L) 3.87 - 5.11 MIL/uL   Hemoglobin 10.2 (L) 12.0 - 15.0 g/dL   HCT 32.2 (L) 36.0 - 46.0 %   MCV 96.4 80.0 - 100.0 fL   MCH 30.5 26.0 - 34.0 pg   MCHC 31.7 30.0 - 36.0 g/dL   RDW 13.2 11.5 - 15.5 %   Platelets 221 150 - 400 K/uL   nRBC 0.0 0.0 - 0.2 %    Comment: Performed at [facility], [place] [address], [place], [place] [address]  Hepatic function panel     Status: Abnormal   Collection Time: 06/02/21  3:14 AM  Result Value Ref Range   Total Protein 5.6 (L) 6.5 - 8.1 g/dL   Albumin 2.8 (L) 3.5 - 5.0 g/dL   AST 34 15 - 41 U/L   ALT 59 (H) 0 - 44 U/L   Alkaline Phosphatase 63 38 - 126 U/L   Total Bilirubin 0.5 0.3 - 1.2 mg/dL   Bilirubin, Direct <0.1 0.0 - 0.2 mg/dL   Indirect Bilirubin NOT CALCULATED 0.3 - 0.9 mg/dL    Comment: Performed at [facility], [place] [address], [place], [place] [address]  Potassium     Status: None   Collection Time: 06/02/21  3:14 AM  Result Value Ref Range   Potassium 3.5 3.5 - 5.1 mmol/L    Comment: Performed at [facility], [address] [place], [place] [address]  Creatinine, serum     Status: None   Collection Time: 06/02/21  3:14 AM  Result Value Ref Range   Creatinine, Ser 0.56 0.44 - 1.00 mg/dL   GFR, Estimated >60 >60 mL/min    Comment: (NOTE) Calculated using the CKD-EPI Creatinine Equation (2021) Performed at [facility], [place] [address] [place], [place] [address]   Glucose, capillary     Status: Abnormal   Collection Time: 06/02/21  7:15 AM  Result Value Ref Range   Glucose-Capillary 169 (H) 70 - 99 mg/dL    Comment: Glucose reference range applies  only to samples taken after fasting for at least 8 hours.    Imaging / Studies: No results found.  Medications / Allergies: per chart  Antibiotics: Anti-infectives (From admission, onward)    Start     Dose/Rate Route Frequency Ordered Stop   05/31/21 0945  cefTRIAXone (ROCEPHIN) 2 g in sodium chloride 0.9 % 100 mL IVPB       See Hyperspace for full Linked Orders Report.   2 g 200 mL/hr over 30 Minutes Intravenous On call to O.R. 05/31/21 3154 05/31/21 1211   05/31/21 0945  metroNIDAZOLE (FLAGYL) IVPB 500 mg       See Hyperspace for full Linked Orders Report.   500 mg 100 mL/hr over 60 Minutes Intravenous On call to O.R. 05/31/21 0086 05/31/21 1223         Note: Portions of this report may have been transcribed using voice recognition software. Every effort was made to ensure accuracy; however, inadvertent computerized transcription errors may be present.   Any transcriptional errors that result from this process are unintentional.    [name], MD, FACS, MASCRS Esophageal, Gastrointestinal & Colorectal Surgery Robotic and Minimally Invasive Surgery  [facility], [place]  [place] [place], [place] [address] [phone] Fax [phone] Main  CONTACT INFORMATION:  Weekday (9AM-5PM): Call [facility] main office at [phone]  Weeknight (5PM-9AM) or Weekend/Holiday: Check [email or web address] (password " [email or web address]") for General Surgery [facility] coverage  (Please, do not use SecureChat as it is not reliable communication to operating surgeons for immediate patient care)      06/02/2021  8:06 AM

## 2021-06-02 NOTE — Consult Note (Signed)
Referring Provider: Thurman Coyer, MD; [facility]         Primary Care Physician:  [name], MD Primary Gastroenterologist: Silvano Rusk, MD           Reason for Consultation: Recent subtotal cholecystectomy, history of prior perforated cholecystitis treated with percutaneous drainage, question bile leak                 ASSESSMENT /  PLAN   61 year old female with complicated perforated cholecystitis, large right upper quadrant abscess treated with percutaneous drainage and prolonged antibiotics now postop day #2 from subtotal cholecystectomy, lysis of adhesions and liver biopsy.  History of perforated cholecystitis complicated by intra-abdominal abscess/chronic calculus cholecystitis/postop day #2 from subtotal cholecystectomy with drain placement --She looks clinically well, and pain is improving.  Drain output to this point has not been excessive though is bilious.  Certainly there could be bile leak given that cholecystectomy was partial and the cystic duct was not able to be definitively determined or clipped due to dense adhesions. --Liver enzymes improving with only mild elevation in ALT which is not unexpected --If bile leak continues via the surgical drain in place then ERCP may become necessary.  ERCP will be more difficult given Roux-en-Y anatomy.  Options would be retrograde with Dr. Rush Landmark versus combined operative approach to access the gastric remnant.  If biliary stent is needed to treat bile leak, then repeat ERCP in 3 months would also be required to remove the biliary stent and reassess for leak.  All being more complicated with anatomy. --I discussed this today at length with Delsie who understands. --For now I have advanced her diet back to soft which she has already been tolerating.  No plan for ERCP today or tomorrow. --I will discuss with Dr. Henrene Pastor who will provide biliary coverage next week but also Dr. Rush Landmark who is also available when possible for  advanced procedures. --Monitor drain output, continue antibiotics  2.  Fatty liver --liver biopsy reviewed.  Mild steatosis without fibrosis.  No evidence for advanced liver disease or cirrhosis.  This is good news for her.   HPI:     Sandra [name] is a 61 y.o. female known to our group with history of perforated cholecystitis treated with percutaneous drainage, subtotal cholecystectomy with LOA on 05/31/2021 with new drain placement.  She also has a history of prior Roux-en-Y gastric bypass, previous gastrojejunal marginal ulcers in 2016, more remote endometrial cancer, CAD, hypertension and hyperlipidemia.  In October she developed perforated cholecystitis complicated by a very large, 20 cm, right upper quadrant abscess treated with percutaneous drainage and 14 weeks of antibiotics.  Admitted for interval cholecystectomy.  On 2-23 she had necrotic gallbladder with numerous stones which was partially removed.  The gallbladder dome, anterior wall and some of the posterior wall was removed.  The old percutaneous drain was removed and a new surgical drain was placed.  There were dense adhesions in the right upper quadrant involving the omentum, colon and duodenal bulb.  Some of these were taken down.  Fatty liver was noted and liver biopsy was performed.  Maven reports really minimal abdominal pain other than at the drain site when she moves around.  She was able to reduce to 1 oxycodone, as opposed to 2 which is controlling her pain very well.  She has walked around the unit today already.  She tolerated a soft breakfast with oatmeal and sausage.  Her daughter brought her in some hibachi  steak last night though she did not eat the rice.  No nausea or vomiting.  Passing gas without bowel movement yet.  Chronic constipation is rather normal for her and she will intermittently use Linzess 145 mcg daily.  She does not feel constipated or in need of bowel movement at this point.    Past Medical  History:  Diagnosis Date   Anxiety    Arthritis    CAD (coronary artery disease)    nonobstructive CAD in LAD per 2013 CTA coronary morph   Cholelithiasis 04/06/2021   Depression    Diabetes mellitus    no meds taken now, checks cbg once or twice a week    Difficult intubation    had to use the glidescope due to small airway    Gallbladder abscess 05/02/2021   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 HYSTERECTOMY   STAGE I ENDOMETROID CARCINOMA   History of kidney stones    Hx of adenomatous colonic polyps 2011   BENIGN   Hyperlipidemia    no meds now   Hypertension    Pneumonia    Sleep apnea    no CPAP since surgery    Past Surgical History:  Procedure Laterality Date   ABDOMINAL HYSTERECTOMY  12-20-2004   TAH   BILATERAL BREAST REDUCTION  11-16-1999   BIOPSY  02/20/2021   Procedure: BIOPSY;  Surgeon: Daryel November, MD;  Location: [facility] ENDOSCOPY;  Service: Gastroenterology;;   Rosanne Gutting PYLORI N/A 05/27/2013   Procedure: Lauris Chroman;  Surgeon: Shann Medal, MD;  Location: [facility];  Service: General;  Laterality: N/A;   [place] N/A 05/31/2021   Procedure: LAPAROSCOPIC LYSIS OF ADHESIONS, PARTIAL CHOLECYSTECTOMY, DRAIN PLACEMENT, WEDGE LIVER BIOPSY;  Surgeon: Michael Boston, MD;  Location: [facility] ORS;  Service: General;  Laterality: N/A;   COLONOSCOPY     COLONOSCOPY N/A 04/25/2014   Procedure: COLONOSCOPY;  Surgeon: Gatha Mayer, MD;  Location: [facility] ENDOSCOPY;  Service: Endoscopy;  Laterality: N/A;   COLONOSCOPY WITH PROPOFOL N/A 07/26/2019   Procedure: COLONOSCOPY WITH PROPOFOL;   Surgeon: Gatha Mayer, MD;  Location: [facility] ENDOSCOPY;  Service: Endoscopy;  Laterality: N/A;   CYSTOSCOPY  09/06/2011   Procedure: CYSTOSCOPY;  Surgeon: Ailene Rud, MD;  Location: [facility];  Service: Urology;  Laterality: N/A;  LYNX SLING    ESOPHAGOGASTRODUODENOSCOPY (EGD) WITH PROPOFOL N/A 09/27/2014   Procedure: ESOPHAGOGASTRODUODENOSCOPY (EGD) WITH PROPOFOL;  Surgeon: Gatha Mayer, MD;  Location: [facility] ENDOSCOPY;  Service: Endoscopy;  Laterality: N/A;   ESOPHAGOGASTRODUODENOSCOPY (EGD) WITH PROPOFOL N/A 03/11/2016   Procedure: ESOPHAGOGASTRODUODENOSCOPY (EGD) WITH PROPOFOL;  Surgeon: Jerene Bears, MD;  Location: [facility];  Service: Endoscopy;  Laterality: N/A;   ESOPHAGOGASTRODUODENOSCOPY (EGD) WITH PROPOFOL Left 02/20/2021   Procedure: ESOPHAGOGASTRODUODENOSCOPY (EGD) WITH PROPOFOL;  Surgeon: Daryel November, MD;  Location: [place];  Service: Gastroenterology;  Laterality: Left;   GASTRIC BYPASS  10/04/2013   GASTRIC ROUX-EN-Y N/A 10/04/2013   Procedure: LAPAROSCOPIC ROUX-EN-Y GASTRIC BYPASS WITH UPPER ENDOSCOPY;  Surgeon: Shann Medal, MD;  Location: [facility];  Service: General;  Laterality: N/A;   HEMORRHOID BANDING  02/2013   IR CHOLANGIOGRAM EXISTING TUBE  05/01/2021   IR PERC CHOLECYSTOSTOMY  03/06/2021   IR RADIOLOGIST EVAL & MGMT  04/12/2021   POLYPECTOMY     POLYPECTOMY  07/26/2019   Procedure: POLYPECTOMY;  Surgeon: Gatha Mayer, MD;  Location: [facility] END[facility];  Service: Endoscopy;;   PUBOVAGINAL SLING  09/06/2011   Procedure: Gaynelle Arabian;  Surgeon: Ailene Rud, MD;  Location: [facility];  Service: Urology;  Laterality: N/A;   REDUCTION MAMMAPLASTY Bilateral 2001   SHOULDER ARTHROSCOPY Right 10/23/2017   Procedure: RIGHT SHOULDER ARTHROSCOPIC ROTATOR CUFF REPAIR VS. DEBRIDEMENT, SUBACROMIAL DECOMPARESSION, DISTAL CLAVICAL EXCISION;  Surgeon: Tania Ade, MD;  Location: [place];  Service: Orthopedics;  Laterality:  Right;   UVULOPALATOPHARYNGOPLASTY, TONSILLECTOMY AND SEPTOPLASTY  1994    Prior to Admission medications   Medication Sig Start Date End Date Taking? Authorizing Provider  acetaminophen (TYLENOL) 500 MG tablet Take 500 mg by mouth every 6 (six) hours as needed for moderate pain.   Yes [provider]  Blood Glucose Monitoring Suppl (FREESTYLE LITE) w/Device KIT Use up to 4 times daily to check blood glucose daily as directed. 04/01/21  Yes Burns, Claudina Lick, MD  COLLAGEN PO Take 1 Scoop by mouth daily.   Yes [provider]  glucose blood (FREESTYLE LITE) test strip Use daily as instructed. 01/05/21  Yes Burns, Claudina Lick, MD  glucose blood (FREESTYLE LITE) test strip Use up to 4 times daily to check blood glucose as directed. 04/01/21  Yes   Lancets (FREESTYLE) lancets Use 1 lancet up to 4 times daily to check blood glucose as directed. 04/01/21  Yes   Multiple Vitamins-Minerals (MULTIVITAMIN PO) Take 2 tablets by mouth daily.    Yes [provider]  ondansetron (ZOFRAN-ODT) 4 MG disintegrating tablet DISSOLVE 1 TABLET BY MOUTH EVERY 8 HOURS AS NEEDED FOR NAUSEA OR VOMITING (MAY TAKE 2 TABLETS IF 1 TABLET IS INEFFECTIVE) Patient taking differently: Take 4-8 mg by mouth every 8 (eight) hours as needed for nausea or vomiting. 06/26/20 06/26/21 Yes Gatha Mayer, MD  polyethylene glycol powder (MIRALAX) 17 GM/SCOOP powder Take 17 g by mouth daily. 02/20/21  Yes Irene Pap N, DO  gabapentin (NEURONTIN) 300 MG capsule Take 1 capsule by mouth 2 times daily. 06/01/21   Michael Boston, MD  omeprazole (PRILOSEC) 40 MG capsule Take 1 capsule by mouth 2 times daily before a meal. Open capsule and sprinkle in water, other liquid or applesauce Patient taking differently: Take 40 mg by mouth daily as needed (acid reflux). 02/27/21   Gatha Mayer, MD  oxyCODONE (OXY IR/ROXICODONE) 5 MG immediate release tablet Take 1 - 2 tablets by mouth every 4 hours as needed for moderate pain or severe  pain. 06/01/21   Michael Boston, MD  Sodium Chloride Flush (NORMAL SALINE FLUSH) 0.9 % SOLN Flush drain once daily as directed 04/02/21   Michael Boston, MD    Current Facility-Administered Medications  Medication Dose Route Frequency Provider Last Rate Last Admin   0.9 %  sodium chloride infusion  250 mL Intravenous PRN Michael Boston, MD       acetaminophen (TYLENOL) tablet 1,000 mg  1,000 mg Oral Lajuana Ripple, MD   1,000 mg at 06/02/21 0541   bisacodyl (DULCOLAX) suppository 10 mg  10 mg Rectal Daily PRN Michael Boston, MD       bisacodyl (DULCOLAX) suppository 10 mg  10 mg Rectal Once Michael Boston, MD       diphenhydrAMINE (BENADRYL) 12.5 MG/5ML elixir 12.5 mg  12.5 mg Oral Q6H PRN Michael Boston, MD       Or   diphenhydrAMINE (BENADRYL) injection 12.5 mg  12.5 mg Intravenous Q6H PRN Michael Boston, MD       enalaprilat (VASOTEC) injection 0.625-1.25 mg  0.625-1.25 mg Intravenous Q6H PRN Michael Boston, MD       feeding supplement (GLUCERNA SHAKE) (GLUCERNA SHAKE) liquid 237 mL  237 mL Oral BID BM Michael Boston, MD   237 mL at 06/01/21 1407   gabapentin (NEURONTIN) capsule 300 mg  300 mg Oral TID Michael Boston, MD       HYDROmorphone (DILAUDID) injection 0.5-2 mg  0.5-2 mg Intravenous Q2H PRN Michael Boston, MD       insulin aspart (novoLOG) injection 0-15 Units  0-15 Units Subcutaneous TID WC Michael Boston, MD   2 Units at 06/01/21 1724   insulin aspart (novoLOG) injection 0-5 Units  0-5 Units Subcutaneous QHS Michael Boston, MD       lactated ringers bolus 1,000 mL  1,000 mL Intravenous Q8H PRN Michael Boston, MD       lip balm (CARMEX) ointment 1 application  1 application Topical BID Michael Boston, MD   1 application at 16/60/60 2122   magic mouthwash  15 mL Oral QID PRN Michael Boston, MD       magnesium hydroxide (MILK OF MAGNESIA) suspension 30 mL  30 mL Oral Daily PRN Michael Boston, MD       magnesium sulfate IVPB 2 g 50 mL  2 g Intravenous Once Michael Boston, MD       methocarbamol  (ROBAXIN) 1,000 mg in dextrose 5 % 100 mL IVPB  1,000 mg Intravenous Q6H PRN Michael Boston, MD       methocarbamol (ROBAXIN) tablet 500-1,000 mg  500-1,000 mg Oral Q6H PRN Michael Boston, MD   1,000 mg at 06/01/21 1505   metoprolol tartrate (LOPRESSOR) injection 5 mg  5 mg Intravenous Q6H PRN Michael Boston, MD       ondansetron [facility]) injection 4 mg  4 mg Intravenous Q6H PRN Armandina Gemma, MD   4 mg at 06/01/21 0247   oxyCODONE (Oxy IR/ROXICODONE) immediate release tablet 5-10 mg  5-10 mg Oral Q4H PRN Michael Boston, MD   10 mg at 06/01/21 2341   pantoprazole (PROTONIX) EC tablet 40 mg  40 mg Oral Q1200 Michael Boston, MD   40 mg at 06/01/21 1205   polyethylene glycol (MIRALAX / GLYCOLAX) packet 17 g  17 g Oral BID Michael Boston, MD       polyethylene glycol (MIRALAX / GLYCOLAX) packet 34 g  34 g Oral Once Michael Boston, MD       simethicone [facility]) chewable tablet 40 mg  40 mg Oral Q6H PRN Michael Boston, MD       sodium chloride flush (NS) 0.9 % injection 3 mL  3 mL Intravenous Gorden Harms, MD   3 mL at 06/01/21 2121   sodium chloride flush (NS) 0.9 % injection 3 mL  3 mL Intravenous PRN Michael Boston, MD        Allergies as of 04/05/2021 - Review Complete 03/30/2021  Allergen Reaction Noted   Nsaids  09/06/2016   Statins  11/14/2018    Family History  Adopted: Yes    Social History   Tobacco Use   Smoking status: Never   Smokeless tobacco: Never  Vaping Use   Vaping Use: Never used  Substance Use Topics   Alcohol use: No    Alcohol/week: 0.0 standard drinks   Drug use: No    Review of Systems: All systems reviewed and negative except where noted in HPI.  Physical Exam: Vital signs in last 24 hours: Temp:  [98.2 F (36.8 C)-100.7 F (38.2 C)] 99.3 F (37.4 C) (02/04 0547) Pulse Rate:  [89-103] 103 (02/04 0547) Resp:  [17-20] 17 (02/04 0547) BP: (95-139)/(62-80) 139/80 (02/04 0547) SpO2:  [93 %-100 %] 94 % (02/04 0547) Weight:  [61.6 kg] 61.6 kg (02/04  0710) Last BM Date: 05/30/21 General:   Awake, alert, NAD Psych:  Pleasant, cooperative. Normal mood and affect. Eyes:  Pupils equal, sclera clear, no icterus.    Lungs:  Clear throughout to auscultation.   No wheezes, crackles, or rhonchi.  Heart:  Regular rate and rhythm; no murmurs, no lower extremity edema Abdomen:  Soft,  nontender, BS active, surgical drain ruq with bilious nonbloody fluid   Rectal:  Deferred  Msk:  Symmetrical without gross deformities. . Neurologic:  Alert and  oriented x4;  grossly normal neurologically. Skin:  Intact without significant lesions or rashes.   Intake/Output from previous day: 02/03 0701 - 02/04 0700 In: 600 [P.O.:600] Out: 195 [Drains:195] Intake/Output this shift: No intake/output data recorded.  Lab Results: Recent Labs    06/01/21 0320 06/02/21 0314  WBC 11.7* 12.8*  HGB 10.8* 10.2*  HCT 33.5* 32.2*  PLT 246 221   BMET Recent Labs    06/01/21 0320 06/02/21 0314  NA 134*  --   K 4.0 3.5  CL 98  --   CO2 29  --   GLUCOSE 160*  --   BUN 13  --   CREATININE 0.58 0.56  CALCIUM 8.7*  --    LFT Recent Labs    06/02/21 0314  PROT 5.6*  ALBUMIN 2.8*  AST 34  ALT 59*  ALKPHOS 63  BILITOT 0.5  BILIDIR <0.1  IBILI NOT CALCULATED    . CBC Latest Ref Rng & Units 06/02/2021 06/01/2021 05/18/2021  WBC 4.0 - 10.5 K/uL 12.8(H) 11.7(H) 9.6  Hemoglobin 12.0 - 15.0 g/dL 10.2(L) 10.8(L) 13.8  Hematocrit 36.0 - 46.0 % 32.2(L) 33.5(L) 43.1  Platelets 150 - 400 K/uL 221 246 326    . CMP Latest Ref Rng & Units 06/02/2021 06/01/2021 05/18/2021  Glucose 70 - 99 mg/dL - 160(H) 124(H)  BUN 8 - 23 mg/dL - 13 30(H)  Creatinine 0.44 - 1.00 mg/dL 0.56 0.58 0.54  Sodium 135 - 145 mmol/L - 134(L) 139  Potassium 3.5 - 5.1 mmol/L 3.5 4.0 4.2  Chloride 98 - 111 mmol/L - 98 103  CO2 22 - 32 mmol/L - 29 29  Calcium 8.9 - 10.3 mg/dL - 8.7(L) 9.4  Total Protein 6.5 - 8.1 g/dL 5.6(L) 5.5(L) -  Total Bilirubin 0.3 - 1.2 mg/dL 0.5 0.4 -  Alkaline  Phos 38 - 126 U/L 63 61 -  AST 15 - 41 U/L 34 96(H) -  ALT 0 - 44 U/L 59(H) 88(H) -   Studies/Results: No results found.  Principal Problem:   Perforation of gallbladder in cholecystitis    Lajuan Lines. Heena Woodbury, M.D. @  06/02/2021, 8:50 AM

## 2021-06-02 NOTE — Plan of Care (Signed)
  Problem: Nutrition: Goal: Adequate nutrition will be maintained Outcome: Progressing   

## 2021-06-03 DIAGNOSIS — Z9884 Bariatric surgery status: Secondary | ICD-10-CM

## 2021-06-03 LAB — GLUCOSE, CAPILLARY
Glucose-Capillary: 123 mg/dL — ABNORMAL HIGH (ref 70–99)
Glucose-Capillary: 169 mg/dL — ABNORMAL HIGH (ref 70–99)
Glucose-Capillary: 118 mg/dL — ABNORMAL HIGH (ref 70–99)
Glucose-Capillary: 134 mg/dL — ABNORMAL HIGH (ref 70–99)

## 2021-06-03 MED ORDER — K PHOS MONO-SOD PHOS DI & MONO 155-852-130 MG PO TABS
500.0000 mg | ORAL_TABLET | Freq: Two times a day (BID) | ORAL | Status: DC
Start: 2021-06-03 — End: 2021-06-05
  Administered 2021-06-03 – 2021-06-05 (×4): 500 mg via ORAL
  Filled 2021-06-03 (×6): qty 2

## 2021-06-03 MED ORDER — LACTATED RINGERS IV BOLUS: 1000.0000 mL | Freq: Three times a day (TID) | INTRAVENOUS | Status: DC | PRN

## 2021-06-03 NOTE — Progress Notes (Signed)
Progress Note   Subjective  Feeling okay today, abdomen sore Try to get by with the 5 mg dose of oxycodone but feels she likely needs to increase this slightly Moving around without much pain Tolerating regular diet, had chicken noodle soup from [place] last night.  For some reason her breakfast was not delivered and she is waiting on lunch. No nausea or vomiting Has had 2 nonbloody bowel movements this morning She reports very minimal drain output since it was changed late yesterday   Objective  Vital signs in last 24 hours: Temp:  [98.2 F (36.8 C)-99.8 F (37.7 C)] 98.2 F (36.8 C) (02/05 0533) Pulse Rate:  [88-108] 95 (02/05 0533) Resp:  [16-17] 17 (02/05 0533) BP: (124-146)/(74-78) 128/77 (02/05 0533) SpO2:  [94 %-97 %] 95 % (02/05 0533) Last BM Date: 06/03/21 Gen: awake, alert, NAD HEENT: anicteric CV: RRR, no mrg Pulm: CTA b/l Abd: soft, mildly tenderness without rebound, nondistended, + BS; surgical drain with 15 cc bilious fluid Ext: no c/c/e Neuro: nonfocal   Intake/Output from previous day: 02/04 0701 - 02/05 0700 In: 1220 [P.O.:1070; IV Piggyback:150] Out: 75 [Drains:75] Intake/Output this shift: Total I/O In: 50 [IV Piggyback:50] Out: -   Lab Results: Recent Labs    06/01/21 0320 06/02/21 0314  WBC 11.7* 12.8*  HGB 10.8* 10.2*  HCT 33.5* 32.2*  PLT 246 221   BMET Recent Labs    06/01/21 0320 06/02/21 0314  NA 134*  --   K 4.0 3.5  CL 98  --   CO2 29  --   GLUCOSE 160*  --   BUN 13  --   CREATININE 0.58 0.56  CALCIUM 8.7*  --    LFT Recent Labs    06/02/21 0314  PROT 5.6*  ALBUMIN 2.8*  AST 34  ALT 59*  ALKPHOS 63  BILITOT 0.5  BILIDIR <0.1  IBILI NOT CALCULATED   Surgical drain; 75 cc over 24 hour period yesterday    Assessment & Recommendations  61 year old female with complicated perforated cholecystitis, large right upper quadrant abscess treated with percutaneous drainage and prolonged antibiotics now postop day  #2 from subtotal cholecystectomy, lysis of adhesions and liver biopsy.  History of perforated cholecystitis complicated by intra-abdominal abscess/postop day #3 subtotal cholecystectomy with drain placement --She continues to look clinically well; her surgical drain output is actually significantly decreased in the last 12 hours.  Overall output yesterday was only 75 cc. --It certainly seems likely that she would have a bile leak given that the cholecystectomy was partial and the cystic duct was not able to be clearly identified due to dense scar tissue after intra-abdominal infection.  That said, I am a bit surprised the drain output is so minimal --I have discussed current situation with Dr. Henrene Pastor and Dr. Rush Landmark over the weekend.  Retrograde ERCP here is not possible.  Surgical access also not ideal as she may need more than 2 ERCPs to take care of a bile leak (2 ERCPs would be minimal). -- Best approach would likely be the EDGE procedure (EUS directed transgastric ERCP).  Dr. Rush Landmark as perform these in the past though timing and availability of this multistep procedure is a problem here.  Dr. Henrene Pastor plans to discuss this with Dr. Lysle Rubens at [facility].  Dr. Lysle Rubens has also had success with EDGE in 1 step (as opposed to the usual 2 steps).   -- We should have more information soon regarding whether this will be done locally or  at a quaternary center. -- Repeat CBC, CMP tomorrow --Continue regular diet --On antibiotics for now  I discussed all the above at length with Shantia who understands the plan. Dr. Johney Maine as the patient n.p.o. after midnight; very unlikely for any procedure here tomorrow but in the event Dr.Baron could help I will leave this in place (will need to remove early tomorrow once plan established)   LOS: 2 days   Jerene Bears  06/03/2021, 11:48 AM See Shea Evans, [facility], to contact our on call provider

## 2021-06-03 NOTE — Progress Notes (Signed)
Status post laparoscopic lysis adhesions and subtotal cholecystectomy. Path benign. I told the pt the good news

## 2021-06-03 NOTE — Progress Notes (Addendum)
Sandra [name] [id number] [birth year]  CARE TEAM:  PCP: [name], MD  Outpatient Care Team: Patient Care Team: [name], MD as PCP - General (Internal Medicine) Arbutus Leas, PhD as Consulting Physician (Psychology) Tania Ade, MD as Consulting Physician (Orthopedic Surgery) Gatha Mayer, MD as Consulting Physician (Gastroenterology) Knox Royalty, RN as Case Manager Michael Boston, MD as Consulting Physician (General Surgery)  Inpatient Treatment Team: Treatment Team: Attending Provider: Michael Boston, MD; Consulting Physician: Sharyn Creamer, MD; Technician: Bettina Gavia, NT; Registered Nurse: Fredderick Erb, RN   Problem List:   Principal Problem:   Perforation of gallbladder in cholecystitis s/p subtotal cholecystectomy 05/31/2021 Active Problems:   Type 2 diabetes mellitus ([place])   Hypercholesterolemia   Coronary atherosclerosis of native coronary artery   Chronic constipation   History of Roux-en-Y gastric bypass, 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   OR FINDINGS: Patient had extremely dense concrete adhesions of the right upper quadrant with extremely poor planes.  Transverse colon, mesocolon, omentum, and especially duodenal blub completely walling off the gallbladder with concrete adhesions.  Gallbladder necrotic with chronic clear colorless mucus within it and numerous medium size stones.  Not able to  completely come around the gallbladder.  Resected most of the dome anterior wall and some of the posterior wall.  Old percutaneous drain removed.  Kendal Hymen of the surgical drain in place   Liver: Fatty change in the liver quite friable.    Assessment  Improving.  Possible bile leak at gallbladder infundibular stump   [facility] Stay = 2 days)  Plan:  Possible leaking through gallbladder stump after extremely difficult subtotal cholecystectomy.  Seen by [facility] gastroenterology.  Possible tentative plan to do laparoscopically assisted ERCP through gastric remnant.  We will need to reach out and see how this can be coordinated.  We will make n.p.o. after midnight depending on if GI wishes to attempt something tomorrow.  Hypomagnesemia corrected.  Hypophosphatemia.  Add K-Phos.  Moderately malnourished.  Solid diet as tolerated.  Encourage supplemental shakes -Glucerna since diabetes.  Improved pain control.  Continue scheduled Tylenol and gabapentin.  Oxycodone seems to be working better.  Now that her bowels are working, less sore.  Can use ice/heat.  She can shower  Bowel function returned on usual MiraLAX.  Twice daily baseline.  Helped with abdominal pain  Follow off IV fluids.  As needed boluses ordered  Drain care teaching.  She has been used to having a drain for several months.  This one is different.  No flushing.  Check drain for bilirubin level -still pending  Diabetic control - sliding scale insulin for now.  GERD.  PPI  -VTE prophylaxis- SCDs, etc  -mobilize as tolerated to help recovery    Disposition:  Disposition:  The patient is from: Home  Anticipate discharge to:  Home  Anticipated Date of Discharge is:  February 7,2023    Barriers to discharge:  Pending Clinical improvement (more likely than not)  Patient currently is NOT  MEDICALLY STABLE for discharge from the hospital from a surgery standpoint.      25 minutes spent in review, evaluation,  examination, counseling, and coordination of care.   I have reviewed this patient's available data, including medical history, events of note, physical examination and test results as part of my evaluation.  A significant portion of that time was spent in counseling.  Care during the described time interval was provided by me.  06/03/2021    Subjective: (Chief complaint)  Patient had a bowel movements after MiraLAX and feeling better.  Seen by gastroenterology.  Remains in bed.  Claims to be walking.  Objective:  Vital signs:  Vitals:   06/02/21 1312 06/02/21 2033 06/02/21 2154 06/03/21 0533  BP: 124/74 (!) 146/78  128/77  Pulse: 88 (!) 108  95  Resp: 16 17  17   Temp: 99.1 F (37.3 C)  99.8 F (37.7 C) 98.2 F (36.8 C)  TempSrc: Oral     SpO2: 97% 94%  95%  Weight:      Height:        Last BM Date: 05/30/21  Intake/Output   Yesterday:  02/04 0701 - 02/05 0700 In: 4259 [P.O.:1070; IV Piggyback:150] Out: 75 [Drains:75] This shift:  No intake/output data recorded.  Bowel function:  Flatus: YES  BM:  YES  Drain: Dark red output with possible brown tinge.  Not frothy like frank bile, but concerning   Physical Exam:  General: Pt awake/alert in no acute distress.  Tired but not toxic Eyes: PERRL, normal EOM.  Sclera clear.  No icterus Neuro: CN II-XII intact w/o focal sensory/motor deficits. Lymph: No head/neck/groin lymphadenopathy Psych:  No delerium/psychosis/paranoia.  Oriented x 4 HENT: Normocephalic, Mucus membranes moist.  No thrush Neck: Supple, No tracheal deviation.  No obvious thyromegaly Chest: No pain to chest wall compression.  Good respiratory excursion.  No audible wheezing CV:  Pulses intact.  Regular rhythm.  No major extremity edema MS: Normal AROM mjr joints.  No obvious deformity  Abdomen: Soft.  Nondistended.  Tenderness at right lower quadrant drain exit site only .  No evidence of peritonitis.  No incarcerated hernias.  Ext:   No  deformity.  No mjr edema.  No cyanosis Skin: No petechiae / purpurea.  No major sores.  Warm and dry    Results:   Cultures: No results found for this or any previous visit (from the past 720 hour(s)).  Labs: Results for orders placed or performed during the hospital encounter of 05/31/21 (from the past 48 hour(s))  Glucose, capillary     Status: Abnormal   Collection Time: 06/01/21 11:30 AM  Result Value Ref [place]   Glucose-Capillary 154 (H) 70 - 99 mg/dL    Comment: Glucose reference range applies only to samples taken after fasting for at least 8 hours.   Comment 1 QC Due   Glucose, capillary     Status: Abnormal   Collection Time: 06/01/21  3:45 PM  Result Value Ref Range   Glucose-Capillary 135 (H) 70 - 99 mg/dL    Comment: Glucose reference range applies only to samples taken after fasting for at least 8 hours.  Glucose, capillary     Status: Abnormal   Collection Time: 06/01/21  9:24 PM  Result Value Ref Range   Glucose-Capillary 140 (H) 70 - 99 mg/dL    Comment: Glucose reference range applies only to samples taken after fasting for at least 8 hours.  CBC     Status: Abnormal  Collection Time: 06/02/21  3:14 AM  Result Value Ref Range   WBC 12.8 (H) 4.0 - 10.5 K/uL   RBC 3.34 (L) 3.87 - 5.11 MIL/uL   Hemoglobin 10.2 (L) 12.0 - 15.0 g/dL   HCT 32.2 (L) 36.0 - 46.0 %   MCV 96.4 80.0 - 100.0 fL   MCH 30.5 26.0 - 34.0 pg   MCHC 31.7 30.0 - 36.0 g/dL   RDW 13.2 11.5 - 15.5 %   Platelets 221 150 - 400 K/uL   nRBC 0.0 0.0 - 0.2 %    Comment: Performed at [facility], [place] [address], [place], [place] [address]  Hepatic function panel     Status: Abnormal   Collection Time: 06/02/21  3:14 AM  Result Value Ref [place]   Total Protein 5.6 (L) 6.5 - 8.1 g/dL   Albumin 2.8 (L) 3.5 - 5.0 g/dL   AST 34 15 - 41 U/L   ALT 59 (H) 0 - 44 U/L   Alkaline Phosphatase 63 38 - 126 U/L   Total Bilirubin 0.5 0.3 - 1.2 mg/dL   Bilirubin, Direct <0.1 0.0 - 0.2 mg/dL    Indirect Bilirubin NOT CALCULATED 0.3 - 0.9 mg/dL    Comment: Performed at [facility], [place] [address] [place], [place] [address]  Potassium     Status: None   Collection Time: 06/02/21  3:14 AM  Result Value Ref [place]   Potassium 3.5 3.5 - 5.1 mmol/L    Comment: Performed at [facility], [place] [address], [place], [place] [address]  Creatinine, serum     Status: None   Collection Time: 06/02/21  3:14 AM  Result Value Ref [place]   Creatinine, Ser 0.56 0.44 - 1.00 mg/dL   GFR, Estimated >60 >60 mL/min    Comment: (NOTE) Calculated using the CKD-EPI Creatinine Equation (2021) Performed at [facility], [place] [address] [place], [place] [address]   Glucose, capillary     Status: Abnormal   Collection Time: 06/02/21  7:15 AM  Result Value Ref [place]   Glucose-Capillary 169 (H) 70 - 99 mg/dL    Comment: Glucose reference range applies only to samples taken after fasting for at least 8 hours.  Prealbumin     Status: Abnormal   Collection Time: 06/02/21  9:35 AM  Result Value Ref [place]   Prealbumin 8.7 (L) 18 - 38 mg/dL    Comment: Performed at [place] [address], [place], [place] [address]  Phosphorus     Status: Abnormal   Collection Time: 06/02/21  9:35 AM  Result Value Ref [place]   Phosphorus 2.4 (L) 2.5 - 4.6 mg/dL    Comment: Performed at [facility], [place] [address], [place], [place] [address]  Glucose, capillary     Status: Abnormal   Collection Time: 06/02/21 12:11 PM  Result Value Ref Range   Glucose-Capillary 134 (H) 70 - 99 mg/dL    Comment: Glucose reference range applies only to samples taken after fasting for at least 8 hours.  Glucose, capillary     Status: Abnormal   Collection Time: 06/02/21  3:50 PM  Result Value Ref Range   Glucose-Capillary 107 (H) 70 - 99 mg/dL    Comment: Glucose reference range applies only to samples taken after fasting for at least 8 hours.   Glucose, capillary     Status: Abnormal   Collection Time: 06/02/21  8:33 PM  Result Value  Ref Range   Glucose-Capillary 148 (H) 70 - 99 mg/dL    Comment: Glucose reference range applies only to samples taken after fasting for at least 8 hours.  Glucose, capillary     Status: Abnormal   Collection Time: 06/03/21  7:20 AM  Result Value Ref [place]   Glucose-Capillary 123 (H) 70 - 99 mg/dL    Comment: Glucose reference range applies only to samples taken after fasting for at least 8 hours.    Imaging / Studies: No results found.  Medications / Allergies: per chart  Antibiotics: Anti-infectives (From admission, onward)    Start     Dose/Rate Route Frequency Ordered Stop   06/02/21 1000  piperacillin-tazobactam (ZOSYN) IVPB 3.375 g        3.375 g 12.5 mL/hr over 240 Minutes Intravenous Every 8 hours 06/02/21 0859 06/07/21 0959   05/31/21 0945  cefTRIAXone (ROCEPHIN) 2 g in sodium chloride 0.9 % 100 mL IVPB       See Hyperspace for full Linked Orders Report.   2 g 200 mL/hr over 30 Minutes Intravenous On call to O.R. 05/31/21 4801 05/31/21 1211   05/31/21 0945  metroNIDAZOLE (FLAGYL) IVPB 500 mg       See Hyperspace for full Linked Orders Report.   500 mg 100 mL/hr over 60 Minutes Intravenous On call to O.R. 05/31/21 6553 05/31/21 1223         Note: Portions of this report may have been transcribed using voice recognition software. Every effort was made to ensure accuracy; however, inadvertent computerized transcription errors may be present.   Any transcriptional errors that result from this process are unintentional.    [name], MD, FACS, MASCRS Esophageal, Gastrointestinal & Colorectal Surgery Robotic and Minimally Invasive Surgery  [facility], [place]  [place]. [address], [place], [place] [address] [phone] Fax [phone] Main  CONTACT INFORMATION:  Weekday (9AM-5PM): Call [facility] main  office at [phone]  Weeknight (5PM-9AM) or Weekend/Holiday: Check [email or web address] (password " [email or web address]") for General Surgery [facility] coverage  (Please, do not use SecureChat as it is not reliable communication to operating surgeons for immediate patient care)      06/03/2021  7:57 AM

## 2021-06-04 ENCOUNTER — Inpatient Hospital Stay (HOSPITAL_COMMUNITY): Payer: 59

## 2021-06-04 ENCOUNTER — Other Ambulatory Visit (HOSPITAL_COMMUNITY): Payer: Self-pay

## 2021-06-04 DIAGNOSIS — K8010 Calculus of gallbladder with chronic cholecystitis without obstruction: Secondary | ICD-10-CM

## 2021-06-04 DIAGNOSIS — R1011 Right upper quadrant pain: Secondary | ICD-10-CM

## 2021-06-04 DIAGNOSIS — Z9884 Bariatric surgery status: Secondary | ICD-10-CM

## 2021-06-04 LAB — CBC
HCT: 30.2 % — ABNORMAL LOW (ref 36.0–46.0)
Hemoglobin: 9.8 g/dL — ABNORMAL LOW (ref 12.0–15.0)
MCH: 30.8 pg (ref 26.0–34.0)
MCHC: 32.5 g/dL (ref 30.0–36.0)
MCV: 95 fL (ref 80.0–100.0)
Platelets: 233 10*3/uL (ref 150–400)
RBC: 3.18 MIL/uL — ABNORMAL LOW (ref 3.87–5.11)
RDW: 12.8 % (ref 11.5–15.5)
WBC: 12.9 10*3/uL — ABNORMAL HIGH (ref 4.0–10.5)
nRBC: 0 % (ref 0.0–0.2)

## 2021-06-04 LAB — COMPREHENSIVE METABOLIC PANEL
ALT: 31 U/L (ref 0–44)
AST: 16 U/L (ref 15–41)
Albumin: 2.6 g/dL — ABNORMAL LOW (ref 3.5–5.0)
Alkaline Phosphatase: 109 U/L (ref 38–126)
Anion gap: 8 (ref 5–15)
BUN: 13 mg/dL (ref 8–23)
CO2: 30 mmol/L (ref 22–32)
Calcium: 8.2 mg/dL — ABNORMAL LOW (ref 8.9–10.3)
Chloride: 96 mmol/L — ABNORMAL LOW (ref 98–111)
Creatinine, Ser: 0.34 mg/dL — ABNORMAL LOW (ref 0.44–1.00)
GFR, Estimated: >60 mL/min (ref >60)
Glucose, Bld: 128 mg/dL — ABNORMAL HIGH (ref 70–99)
Potassium: 3.4 mmol/L — ABNORMAL LOW (ref 3.5–5.1)
Sodium: 134 mmol/L — ABNORMAL LOW (ref 135–145)
Total Bilirubin: 0.6 mg/dL (ref 0.3–1.2)
Total Protein: 5.8 g/dL — ABNORMAL LOW (ref 6.5–8.1)

## 2021-06-04 LAB — GLUCOSE, CAPILLARY
Glucose-Capillary: 129 mg/dL — ABNORMAL HIGH (ref 70–99)
Glucose-Capillary: 113 mg/dL — ABNORMAL HIGH (ref 70–99)
Glucose-Capillary: 133 mg/dL — ABNORMAL HIGH (ref 70–99)
Glucose-Capillary: 110 mg/dL — ABNORMAL HIGH (ref 70–99)
Glucose-Capillary: 136 mg/dL — ABNORMAL HIGH (ref 70–99)

## 2021-06-04 LAB — MAGNESIUM: Magnesium: 1.8 mg/dL (ref 1.7–2.4)

## 2021-06-04 MED ORDER — POLYETHYLENE GLYCOL 3350 17 G PO PACK
17.0000 g | PACK | Freq: Every day | ORAL | Status: DC
  Administered 2021-06-05: 17 g via ORAL
  Filled 2021-06-04: qty 1

## 2021-06-04 MED ORDER — POTASSIUM CHLORIDE 10 MEQ/100ML IV SOLN
10.0000 meq | INTRAVENOUS | Status: DC
  Administered 2021-06-04: 10 meq via INTRAVENOUS
  Filled 2021-06-04 (×4): qty 100

## 2021-06-04 MED ORDER — TECHNETIUM TC 99M MEBROFENIN IV KIT
5.4000 | PACK | Freq: Once | INTRAVENOUS | Status: AC
  Administered 2021-06-04: 5.4 via INTRAVENOUS

## 2021-06-04 MED ORDER — POTASSIUM CHLORIDE 10 MEQ/100ML IV SOLN
10.0000 meq | INTRAVENOUS | Status: AC
  Administered 2021-06-04 (×3): 10 meq via INTRAVENOUS

## 2021-06-04 MED ORDER — LACTATED RINGERS IV BOLUS: 1000.0000 mL | Freq: Three times a day (TID) | INTRAVENOUS | Status: DC | PRN

## 2021-06-04 NOTE — Progress Notes (Addendum)
[facility] Progress Note  CC: Abdominal pain, s/p subtotal cholecystectomy with suspected biliary leak  Subjective: No nausea or vomiting.  She continues to have RUQ tenderness around the drain site otherwise no significant abdominal pain.  She passed a bowel movement yesterday, nonbloody.  Passing gas this morning.  Remains NPO.   Objective:  Vital signs in last 24 hours: Temp:  [98.5 F (36.9 C)-99.7 F (37.6 C)] 98.7 F (37.1 C) (02/06 0930) Pulse Rate:  [95-103] 103 (02/06 0930) Resp:  [16-18] 17 (02/06 0930) BP: (104-120)/(71-78) 114/71 (02/06 0930) SpO2:  [90 %-95 %] 94 % (02/06 0930) Last BM Date: 06/03/21 General: Alert 61 year old female in no acute distress. Heart: Regular rate and rhythm, no murmurs Pulm: Breath sounds clear throughout Abdomen: Soft, mild tenderness to the RUQ area without rebound or guarding.  Biliary drain with a scant amount of clear light green drainage.  Laparoscopic scars intact. Extremities:  Without edema. Neurologic:  Alert and  oriented x 4. Grossly normal neurologically. Psych:  Alert and cooperative. Normal mood and affect.  Intake/Output from previous day: 02/05 0701 - 02/06 0700 In: 633.1 [P.O.:270; I.V.:206.3; IV Piggyback:156.8] Out: 20 [Drains:20] Intake/Output this shift: No intake/output data recorded. Perc drain output 75 cc on 06/03/2021, 20cc thus far today.   Lab Results: Recent Labs    06/02/21 0314 06/04/21 0315  WBC 12.8* 12.9*  HGB 10.2* 9.8*  HCT 32.2* 30.2*  PLT 221 233   BMET Recent Labs    06/02/21 0314 06/04/21 0315  NA  --  134*  K 3.5 3.4*  CL  --  96*  CO2  --  30  GLUCOSE  --  128*  BUN  --  13  CREATININE 0.56 0.34*  CALCIUM  --  8.2*   LFT Recent Labs    06/02/21 0314 06/04/21 0315  PROT 5.6* 5.8*  ALBUMIN 2.8* 2.6*  AST 34 16  ALT 59* 31  ALKPHOS 63 109  BILITOT 0.5 0.6  BILIDIR <0.1  --   IBILI NOT CALCULATED  --    PT/INR No results for input(s): LABPROT,  INR in the last 72 hours. Hepatitis Panel No results for input(s): HEPBSAG, HCVAB, HEPAIGM, HEPBIGM in the last 72 hours.  No results found.  Assessment / Plan:  30) 61 year old female with a history of a Roux-en-Y gastric bypass in 2015 previously admitted to the hospital with a perforated gallbladder with a 20 cm abscess s/p percutaneous drainage 03/06/2021 treated with IV antibiotics then oral Augmentin x 2 weeks. Readmitted to the hospital s/p subtotal cholecystectomy 05/31/2021 with perc drain replacement, lysis of adhesions and liver biopsy.  Minimal drain output over the past 24 hours.  LFTs stable.  She is afebrile.  WBC 12.9.  Hemodynamically stable.  -HIDA scan today to assess for a biliary leak -NPO until HIDA results reviewed  -No plans for laparoscopic assisted ERCP at this time -Continue IV antibiotic -Pain management and IV fluids per the hospitalist  -Await further recommendations per Dr. Henrene Pastor -CBC, hepatic panel in a.m.  2) Hepatic steatosis confirmed by liver biopsy.    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  Case discussed with Dr. Hilarie Fredrickson.  Patient personally seen and examined.  Agree with interval progress note as outlined.  She is clinically stable.  Mild leukocytosis.  Normal liver test.  HIDA scan NEGATIVE for bile leak.  Minimal drainage output.  Advance diet.  Continue IV antibiotics.  Might consider cross-sectional imaging to exclude any significant persistent  collection.  Discussed with the patient.  We will follow.  Docia Chuck. Geri Seminole., M.D. [facility] Division of Gastroenterology

## 2021-06-04 NOTE — Progress Notes (Addendum)
Sandra [name] [id number] Dec 16, 1960  CARE TEAM:  PCP: [name], MD  Outpatient Care Team: Patient Care Team: [name], MD as PCP - General (Internal Medicine) Arbutus Leas, PhD as Consulting Physician (Psychology) Tania Ade, MD as Consulting Physician (Orthopedic Surgery) Gatha Mayer, MD as Consulting Physician (Gastroenterology) Knox Royalty, RN as Case Manager Michael Boston, MD as Consulting Physician (General Surgery)  Inpatient Treatment Team: Treatment Team: Attending Provider: Michael Boston, MD; Registered Nurse: Tana Coast, RN; Registered Nurse: Jerrol Banana, RN; Consulting Physician: Nolon Nations, MD; Consulting Physician: Irene Shipper, MD; Registered Nurse: Audrea Muscat, RN; Pharmacist: Napoleon Form, [employer]; Technician: Elliot Gault, [place]; Registered Nurse: Kandee Keen, RN; Utilization Review: Micah Noel, RN   Problem List:   Principal Problem:   Perforation of gallbladder in cholecystitis s/p subtotal cholecystectomy 05/31/2021 Active Problems:   Type 2 diabetes mellitus ([place])   Hypercholesterolemia   Coronary atherosclerosis of native coronary artery   Chronic constipation   History of Roux-en-Y gastric bypass, 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   OR FINDINGS: Patient had extremely dense concrete adhesions of the right upper quadrant with extremely poor planes.  Transverse colon, mesocolon,  omentum, and especially duodenal blub completely walling off the gallbladder with concrete adhesions.  Gallbladder necrotic with chronic clear colorless mucus within it and numerous medium size stones.  Not able to completely come around the gallbladder.  Resected most of the dome anterior wall and some of the posterior wall.  Old percutaneous drain removed.  Kendal Hymen of the surgical drain in place   Liver: Fatty change in the liver quite friable.    Assessment  Improving.  Possible bile leak at gallbladder infundibular stump   [facility] Stay = 3 days)  Plan:  Possible leaking through gallbladder stump after extremely difficult subtotal cholecystectomy.    Abx x 5 d postop IV Zosyn 2/3>> finish 2/8  Seen by [facility] gastroenterology.  Possible tentative plan to do [facility] EUS guided drainage/stenting if Dr. Rush Landmark can do it versus seeing if [facility] can do it.  Another option laparoscopically assisted ERCP through gastric remnant.  The possibility is observation with follow-up HIDA scan in 2 weeks vs now - if negative for leak at that point, then remove drain vs lap assisted ERCP vs EDGE - I am a little guarded against that but see what GI thinks.  D/w [facility] PA.  Dr Henrene Pastor to see & d/w partners  NPO for now depending on if GI wishes to attempt something today.  Seems unlikely.  If no procedure, can go back on heart diet.  Hypokalemia.  On K-Phos.  We will give 1 additional IV dose of KCl  Hypomagnesemia corrected.  Hypophosphatemia.  Add K-Phos.  Moderately malnourished.  Solid diet as tolerated.  Encourage supplemental shakes -Glucerna since diabetes.  Improved pain control.  Continue scheduled Tylenol and gabapentin.  Oxycodone seems to be working better.  Now that her bowels are working, less sore.  Can  use ice/heat.  She can shower  Bowel function returned on usual MiraLAX.  Cleaned out.  Back down to just daily MiraLAX.  Follow off IV fluids.  As needed boluses ordered  Drain  care teaching.  She has been used to having a drain for several months.  This one is different.  No flushing.  Check drain for bilirubin level -still pending  Diabetic control - sliding scale insulin for now.  GERD.  PPI  -VTE prophylaxis- SCDs, etc  -mobilize as tolerated to help recovery    Disposition:  Disposition:  The patient is from: Home  Anticipate discharge to:  Home  Anticipated Date of Discharge is:  February 7,2023    Barriers to discharge:  Pending Clinical improvement (more likely than not)  Patient currently is NOT MEDICALLY STABLE for discharge from the hospital from a surgery standpoint.      25 minutes spent in review, evaluation, examination, counseling, and coordination of care.   I have reviewed this patient's available data, including medical history, events of note, physical examination and test results as part of my evaluation.  A significant portion of that time was spent in counseling.  Care during the described time interval was provided by me.  06/04/2021    Subjective: (Chief complaint)  Cleanout with MiraLAX.  Wishes to back off.  No nausea or vomiting.  Abdominal pain less.  Drain output down.  Objective:  Vital signs:  Vitals:   06/03/21 0533 06/03/21 1425 06/03/21 2145 06/04/21 0518  BP: 128/77 120/78 104/75 115/75  Pulse: 95 98 95 100  Resp: 17 18 16 16   Temp: 98.2 F (36.8 C) 99.7 F (37.6 C) 98.7 F (37.1 C) 98.5 F (36.9 C)  TempSrc:  Oral Oral Oral  SpO2: 95% 90% 95% 95%  Weight:      Height:        Last BM Date: 06/03/21  Intake/Output   Yesterday:  02/05 0701 - 02/06 0700 In: 633.1 [P.O.:270; I.V.:206.3; IV Piggyback:156.8] Out: 20 [Drains:20] This shift:  No intake/output data recorded.  Bowel function:  Flatus: YES  BM:  YES  Drain: Scant and more serosanguineous.  No definite bile.  Physical Exam:  General: Pt awake/alert in no acute distress.  Tired but not toxic Eyes: PERRL, normal EOM.   Sclera clear.  No icterus Neuro: CN II-XII intact w/o focal sensory/motor deficits. Lymph: No head/neck/groin lymphadenopathy Psych:  No delerium/psychosis/paranoia.  Oriented x 4 HENT: Normocephalic, Mucus membranes moist.  No thrush Neck: Supple, No tracheal deviation.  No obvious thyromegaly Chest: No pain to chest wall compression.  Good respiratory excursion.  No audible wheezing CV:  Pulses intact.  Regular rhythm.  No major extremity edema MS: Normal AROM mjr joints.  No obvious deformity  Abdomen: Soft.  Nondistended.  Tenderness at right lower quadrant drain exit site only .  No evidence of peritonitis.  No incarcerated hernias.  Ext:   No deformity.  No mjr edema.  No cyanosis Skin: No petechiae / purpurea.  No major sores.  Warm and dry    Results:   Cultures: No results found for this or any previous visit (from the past 720 hour(s)).  Labs: Results for orders placed or performed during the hospital encounter of 05/31/21 (from the past 48 hour(s))  Prealbumin     Status: Abnormal   Collection Time: 06/02/21  9:35 AM  Result Value Ref Range   Prealbumin 8.7 (L) 18 - 38 mg/dL    Comment: Performed at  [facility], [place] [address] [place], [place] [address]  Phosphorus     Status: Abnormal   Collection Time: 06/02/21  9:35 AM  Result Value Ref Range   Phosphorus 2.4 (L) 2.5 - 4.6 mg/dL    Comment: Performed at [facility], [place] [address] [place], [place] [address]  Glucose, capillary     Status: Abnormal   Collection Time: 06/02/21 12:11 PM  Result Value Ref Range   Glucose-Capillary 134 (H) 70 - 99 mg/dL    Comment: Glucose reference range applies only to samples taken after fasting for at least 8 hours.  Glucose, capillary     Status: Abnormal   Collection Time: 06/02/21  3:50 PM  Result Value Ref Range   Glucose-Capillary 107 (H) 70 - 99 mg/dL    Comment: Glucose reference range applies only to samples taken after fasting for at least  8 hours.  Glucose, capillary     Status: Abnormal   Collection Time: 06/02/21  8:33 PM  Result Value Ref Range   Glucose-Capillary 148 (H) 70 - 99 mg/dL    Comment: Glucose reference range applies only to samples taken after fasting for at least 8 hours.  Glucose, capillary     Status: Abnormal   Collection Time: 06/03/21  7:20 AM  Result Value Ref Range   Glucose-Capillary 123 (H) 70 - 99 mg/dL    Comment: Glucose reference range applies only to samples taken after fasting for at least 8 hours.  Glucose, capillary     Status: Abnormal   Collection Time: 06/03/21 11:48 AM  Result Value Ref Range   Glucose-Capillary 169 (H) 70 - 99 mg/dL    Comment: Glucose reference range applies only to samples taken after fasting for at least 8 hours.  Glucose, capillary     Status: Abnormal   Collection Time: 06/03/21  4:50 PM  Result Value Ref Range   Glucose-Capillary 118 (H) 70 - 99 mg/dL    Comment: Glucose reference range applies only to samples taken after fasting for at least 8 hours.  Glucose, capillary     Status: Abnormal   Collection Time: 06/03/21  9:47 PM  Result Value Ref Range   Glucose-Capillary 134 (H) 70 - 99 mg/dL    Comment: Glucose reference range applies only to samples taken after fasting for at least 8 hours.  CBC     Status: Abnormal   Collection Time: 06/04/21  3:15 AM  Result Value Ref Range   WBC 12.9 (H) 4.0 - 10.5 K/uL   RBC 3.18 (L) 3.87 - 5.11 MIL/uL   Hemoglobin 9.8 (L) 12.0 - 15.0 g/dL   HCT 30.2 (L) 36.0 - 46.0 %   MCV 95.0 80.0 - 100.0 fL   MCH 30.8 26.0 - 34.0 pg   MCHC 32.5 30.0 - 36.0 g/dL   RDW 12.8 11.5 - 15.5 %   Platelets 233 150 - 400 K/uL   nRBC 0.0 0.0 - 0.2 %    Comment: Performed at [facility], [place] [address], [place], [place] [address]  Comprehensive metabolic panel     Status: Abnormal   Collection Time: 06/04/21  3:15 AM  Result Value Ref Range   Sodium 134 (L) 135 - 145 mmol/L   Potassium 3.4 (L) 3.5 - 5.1 mmol/L    Chloride 96 (L) 98 - 111 mmol/L   CO2 30 22 - 32 mmol/L   Glucose, Bld 128 (H) 70 - 99 mg/dL  Comment: Glucose reference range applies only to samples taken after fasting for at least 8 hours.   BUN 13 8 - 23 mg/dL   Creatinine, Ser 0.34 (L) 0.44 - 1.00 mg/dL   Calcium 8.2 (L) 8.9 - 10.3 mg/dL   Total Protein 5.8 (L) 6.5 - 8.1 g/dL   Albumin 2.6 (L) 3.5 - 5.0 g/dL   AST 16 15 - 41 U/L   ALT 31 0 - 44 U/L   Alkaline Phosphatase 109 38 - 126 U/L   Total Bilirubin 0.6 0.3 - 1.2 mg/dL   GFR, Estimated >60 >60 mL/min    Comment: (NOTE) Calculated using the CKD-EPI Creatinine Equation (2021)    Anion gap 8 5 - 15    Comment: Performed at [facility], [place] [address], [place], [place] [address]  Glucose, capillary     Status: Abnormal   Collection Time: 06/04/21  7:50 AM  Result Value Ref Range   Glucose-Capillary 129 (H) 70 - 99 mg/dL    Comment: Glucose reference range applies only to samples taken after fasting for at least 8 hours.    Imaging / Studies: No results found.  Medications / Allergies: per chart  Antibiotics: Anti-infectives (From admission, onward)    Start     Dose/Rate Route Frequency Ordered Stop   06/02/21 1000  piperacillin-tazobactam (ZOSYN) IVPB 3.375 g        3.375 g 12.5 mL/hr over 240 Minutes Intravenous Every 8 hours 06/02/21 0859 06/07/21 0959   05/31/21 0945  cefTRIAXone (ROCEPHIN) 2 g in sodium chloride 0.9 % 100 mL IVPB       See Hyperspace for full Linked Orders Report.   2 g 200 mL/hr over 30 Minutes Intravenous On call to O.R. 05/31/21 6837 05/31/21 1211   05/31/21 0945  metroNIDAZOLE (FLAGYL) IVPB 500 mg       See Hyperspace for full Linked Orders Report.   500 mg 100 mL/hr over 60 Minutes Intravenous On call to O.R. 05/31/21 2902 05/31/21 1223         Note: Portions of this report may have been transcribed using voice recognition software. Every effort was made to ensure accuracy; however, inadvertent  computerized transcription errors may be present.   Any transcriptional errors that result from this process are unintentional.    [name], MD, FACS, MASCRS Esophageal, Gastrointestinal & Colorectal Surgery Robotic and Minimally Invasive Surgery  [facility], [place]  [place]. [address], [place],  [address] [phone] Fax [phone] Main  CONTACT INFORMATION:  Weekday (9AM-5PM): Call [facility] main office at [phone]  Weeknight (5PM-9AM) or Weekend/Holiday: Check [email or web address] (password " [email or web address]") for General Surgery [facility] coverage  (Please, do not use SecureChat as it is not reliable communication to operating surgeons for immediate patient care)      06/04/2021  7:58 AM

## 2021-06-04 NOTE — Consult Note (Addendum)
° °  [facility] CM Inpatient Consult   06/04/2021  Akiya Morr 1960-12-16 [id number]  06/05/21 at 11:25 am Addendum:  Spoke with patient via phone [facility] [place]], HIPAA verified, reminded patient of post hospital transition of care call and follow up.  Patient states she is still weak and tired. Explained that call for transition of care with [facility].   Indust[employer] [[employer]] Patient: [employer] Employee  Primary Care Provider:  [name], MD with [facility],is an embedded provider with a Chronic Care Management team and program, and is listed for the transition of care follow up and appointments.  Patient was screened for Embedded practice service needs for chronic care management is noted as active in the chronic care management program   Plan: Notification to be sent to the [facility] Management team and made aware of TOC needs for post hospital needs.  Please contact for further questions,  Natividad Brood, RN BSN [facility] Liaison  [phone] business mobile phone Toll free office [phone]  Fax number: [phone] [email or web address]

## 2021-06-05 ENCOUNTER — Other Ambulatory Visit (HOSPITAL_COMMUNITY): Payer: Self-pay

## 2021-06-05 DIAGNOSIS — R1011 Right upper quadrant pain: Secondary | ICD-10-CM

## 2021-06-05 LAB — COMPREHENSIVE METABOLIC PANEL
ALT: 25 U/L (ref 0–44)
AST: 13 U/L — ABNORMAL LOW (ref 15–41)
Albumin: 2.5 g/dL — ABNORMAL LOW (ref 3.5–5.0)
Alkaline Phosphatase: 139 U/L — ABNORMAL HIGH (ref 38–126)
Anion gap: 11 (ref 5–15)
BUN: 13 mg/dL (ref 8–23)
CO2: 27 mmol/L (ref 22–32)
Calcium: 8.6 mg/dL — ABNORMAL LOW (ref 8.9–10.3)
Chloride: 100 mmol/L (ref 98–111)
Creatinine, Ser: 0.48 mg/dL (ref 0.44–1.00)
GFR, Estimated: >60 mL/min (ref >60)
Glucose, Bld: 151 mg/dL — ABNORMAL HIGH (ref 70–99)
Potassium: 3.9 mmol/L (ref 3.5–5.1)
Sodium: 138 mmol/L (ref 135–145)
Total Bilirubin: 0.5 mg/dL (ref 0.3–1.2)
Total Protein: 5.9 g/dL — ABNORMAL LOW (ref 6.5–8.1)

## 2021-06-05 LAB — CBC
HCT: 30.7 % — ABNORMAL LOW (ref 36.0–46.0)
Hemoglobin: 9.9 g/dL — ABNORMAL LOW (ref 12.0–15.0)
MCH: 31.1 pg (ref 26.0–34.0)
MCHC: 32.2 g/dL (ref 30.0–36.0)
MCV: 96.5 fL (ref 80.0–100.0)
Platelets: 293 10*3/uL (ref 150–400)
RBC: 3.18 MIL/uL — ABNORMAL LOW (ref 3.87–5.11)
RDW: 12.9 % (ref 11.5–15.5)
WBC: 12.7 10*3/uL — ABNORMAL HIGH (ref 4.0–10.5)
nRBC: 0 % (ref 0.0–0.2)

## 2021-06-05 LAB — GLUCOSE, CAPILLARY
Glucose-Capillary: 143 mg/dL — ABNORMAL HIGH (ref 70–99)
Glucose-Capillary: 174 mg/dL — ABNORMAL HIGH (ref 70–99)

## 2021-06-05 MED ORDER — ONDANSETRON 4 MG PO TBDP
4.0000 mg | ORAL_TABLET | Freq: Three times a day (TID) | ORAL | 2 refills | Status: DC | PRN
Start: 2021-06-05 — End: 2021-12-02
  Filled 2021-06-05: qty 8, 2d supply, fill #0
  Filled 2021-07-04: qty 8, 2d supply, fill #1
  Filled 2021-07-19: qty 8, 2d supply, fill #2

## 2021-06-05 NOTE — Discharge Summary (Signed)
Physician Discharge Summary    Patient ID: Sandra [name] MRN: [id number] DOB/AGE: 11-15-1960  61 y.o.  Patient Care Team: [name], MD as PCP - General (Internal Medicine) Arbutus Leas, PhD as Consulting Physician (Psychology) Tania Ade, MD as Consulting Physician (Orthopedic Surgery) Gatha Mayer, MD as Consulting Physician (Gastroenterology) Knox Royalty, RN as Case Manager Michael Boston, MD as Consulting Physician (General Surgery)  Admit date: 05/31/2021  Discharge date: 06/05/2021  Hospital Stay = 4 days    Discharge Diagnoses:  Principal Problem:   Perforation of gallbladder in cholecystitis s/p subtotal cholecystectomy 05/31/2021 Active Problems:   Type 2 diabetes mellitus ([place])   Hypercholesterolemia   Coronary atherosclerosis of native coronary artery   Chronic constipation   History of Roux-en-Y gastric bypass, 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  OR FINDINGS:  Patient had extremely dense concrete adhesions of the right upper quadrant with extremely poor planes.  Transverse colon, mesocolon, omentum, and especially duodenal blub completely walling off the gallbladder with concrete adhesions.  Gallbladder necrotic with chronic clear colorless mucus within it and numerous medium size stones.  Not able to completely come around the gallbladder.  Resected most of the dome anterior wall and some of the posterior wall.   Old percutaneous drain removed.  Kendal Hymen of the surgical drain in place   Liver: Fatty change in the liver quite friable.  Consults: Gastroenterology  Hospital Course:   Patient with very complicated history with giant perforated gallbladder abscess treated with drainage.  Underwent interval cholecystectomy 3 months later.  End of having to subtotal cholecystectomy.  All stones removed.  Initial drainage looks somewhat bilious concerning for leak.  Gastrology was consulted.  Placed on antibiotics.  Her ileus resolved and she was stable back on her usual baseline MiraLAX.  Drainage output tapered down markedly became more serous.  Liver function tests remain normal.  White count remained stable to slightly elevated at 12.  No fevers.  Appetite improved.  Consulted gastrology to consider possible ERCP challenged by the fact that she has a prior gastric bypass to her management.  After much discussion HIDA scan was done which disproved any leak.  Drainage output was minimal.  Improved symptoms.  I decided to move the drain and be discharged with close follow-up.  Postoperatively, the patient gradually mobilized and advanced to a solid diet.  Pain and other symptoms were treated aggressively.    By the time of discharge, the patient was walking well the hallways, eating food, having flatus.  Pain was well-controlled on an oral medications.  Based on meeting discharge criteria and continuing to recover, I felt it was safe for the patient to be discharged from the hospital to further recover with close followup. Postoperative recommendations were discussed in detail.  They are written as well.  Discharged Condition: good  Discharge Exam: Blood pressure 122/76, pulse (!) 104, temperature 99.1 F (37.3 C), temperature source Oral, resp. rate 18, height 5' 2"  (1.575 m), weight 61.6 kg, SpO2 96 %.  General:  Pt awake/alert/oriented x4 in No acute distress Eyes: PERRL, normal EOM.  Sclera clear.  No  icterus Neuro: CN II-XII intact w/o focal sensory/motor deficits. Lymph: No head/neck/groin lymphadenopathy Psych:  No delerium/psychosis/paranoia HENT: Normocephalic, Mucus membranes moist.  No thrush Neck: Supple, No tracheal deviation Chest:  No chest wall pain w good excursion CV:  Pulses intact.  Regular rhythm MS: Normal AROM mjr joints.  No obvious deformity Abdomen: Soft.  Nondistended.  Tenderness at drain site only.  Drain with minimal serous output.  Drain removed.  Some burning and stinging but feeling better overall. .  No evidence of peritonitis.  No incarcerated hernias. Ext:  SCDs BLE.  No mjr edema.  No cyanosis Skin: No petechiae / purpura   Disposition:    Follow-up Information     Michael Boston, MD Follow up in 3 week(s).   Specialties: General Surgery, Colon and Rectal Surgery Why: To follow up after your operation Contact information: [place] [address] [phone]                 Discharge disposition: 01-Home or Self Care       Discharge Instructions     Call MD for:   Complete by: As directed    FEVER >101.5 F (Temperatures <101.30F occasionally happen and are not significant)   Call MD for:  extreme fatigue   Complete by: As directed    Call MD for:  persistant dizziness or light-headedness   Complete by: As directed    Call MD for:  persistant nausea and vomiting   Complete by: As directed    Call MD for:  redness, tenderness, or signs of infection (pain, swelling, redness, odor or green/yellow discharge around incision site)   Complete by: As directed    Call MD for:  severe uncontrolled pain   Complete by: As directed    Diet - low sodium heart healthy   Complete by: As directed    Follow a light diet the first few days at home.  Start with a bland diet such as soups, liquids, starchy foods, low fat foods, etc.   If you feel full, bloated, or constipated, stay on a full liquid or pureed/blenderized  diet for a few days until you feel better and no longer constipated. Gradually get back to a regular solid diet.  Avoid fast food or heavy meals the first week as you are more likely to get nauseated.   Discharge instructions   Complete by: As directed    One the day of your discharge from the hospital (or the next business weekday), please call [facility] to set up or confirm an appointment to see your surgeon in the office for a follow-up appointment.  Usually it is 2-3 weeks after your surgery.  Other concerns If you are not getting better after two weeks or are noticing you are getting worse, contact our office [phone] for further advice.  We may need to adjust your medications, re-evaluate you in the office, send you to the emergency room, or see what other things we can do to help. The clinic staff is available to answer your questions during regular business hours (8:30am-5pm).  Please don't hesitate to call and ask to speak to one of our nurses for clinical concerns.    A surgeon from [facility] is always on call at the hospitals 24 hours/day If you have a medical emergency, go to the nearest emergency  room or call 911.   Discharge wound care:   Complete by: As directed    It is good for closed incisions and even open wounds to be washed every day.  Shower every day.  Short baths are fine.  Wash the incisions and wounds clean with soap & water.    You may leave closed incisions open to air if it is dry.   You may cover the incision with clean gauze & replace it after your daily shower for comfort.   Driving Restrictions   Complete by: As directed    You may drive when you are no longer taking prescription pain medication, you can comfortably wear a seatbelt, and you can safely maneuver your car and apply brakes.   Increase activity slowly   Complete by: As directed    Lifting restrictions   Complete by: As directed    You may resume regular (light)  daily activities beginning the next day-such as daily self-care, walking, climbing stairs-gradually increasing activities as tolerated.   If you can walk 30 minutes without difficulty, it is safe to try more intense activity such as jogging, treadmill, bicycling, low-impact aerobics, swimming, etc. Save the most intensive and strenuous activity for last such as sit-ups, heavy lifting, contact sports, etc   Refrain from any heavy lifting or straining until you are off narcotics for pain control.   DO NOT PUSH THROUGH PAIN.   Let pain be your guide: If it hurts to do something, don't do it.   Pain is your body warning you to avoid that activity for another week until the pain goes down.   May shower / Bathe   Complete by: As directed    Wash / shower every day.  You may shower over the dressings as they are waterproof.  Continue to shower over incision(s) after the dressing is off.   May walk up steps   Complete by: As directed    Sexual Activity Restrictions   Complete by: As directed    You may have sexual intercourse when it is comfortable. If it hurts to do something, stop.       Allergies as of 06/05/2021       Reactions   Nsaids    History ulcers.  H/o gastric bypass   Statins    Muscle aches        Medication List     TAKE these medications    acetaminophen 500 MG tablet Commonly known as: TYLENOL Take 500 mg by mouth every 6 (six) hours as needed for moderate pain.   COLLAGEN PO Take 1 Scoop by mouth daily.   freestyle lancets Use 1 lancet up to 4 times daily to check blood glucose as directed.   FREESTYLE LITE test strip Generic drug: glucose blood Use daily as instructed.   FREESTYLE LITE test strip Generic drug: glucose blood Use up to 4 times daily to check blood glucose as directed.   FreeStyle Lite w/Device Kit Use up to 4 times daily to check blood glucose daily as directed.   gabapentin 300 MG capsule Commonly known as: NEURONTIN Take 1 capsule by  mouth 2 times daily. What changed: when to take this   MULTIVITAMIN PO Take 2 tablets by mouth daily.   Normal Saline Flush 0.9 % Soln Flush drain once daily as directed   omeprazole 40 MG capsule Commonly known as: PRILOSEC Take 1 capsule by mouth 2 times daily before a meal. Open capsule and sprinkle in  water, other liquid or applesauce What changed:  when to take this reasons to take this   ondansetron 4 MG disintegrating tablet Commonly known as: ZOFRAN-ODT Take 1-2 tablets (4-8 mg total) by mouth every 8 (eight) hours as needed for nausea or vomiting.   oxyCODONE 5 MG immediate release tablet Commonly known as: Oxy IR/ROXICODONE Take 1 - 2 tablets by mouth every 4 hours as needed for moderate pain or severe pain.   polyethylene glycol powder 17 GM/SCOOP powder Commonly known as: MiraLax Take 17 g by mouth daily.               Discharge Care Instructions  (From admission, onward)           Start     Ordered   06/05/21 0000  Discharge wound care:       Comments: It is good for closed incisions and even open wounds to be washed every day.  Shower every day.  Short baths are fine.  Wash the incisions and wounds clean with soap & water.    You may leave closed incisions open to air if it is dry.   You may cover the incision with clean gauze & replace it after your daily shower for comfort.   06/05/21 0807            Significant Diagnostic Studies:  Results for orders placed or performed during the hospital encounter of 05/31/21 (from the past 72 hour(s))  Prealbumin     Status: Abnormal   Collection Time: 06/02/21  9:35 AM  Result Value Ref Range   Prealbumin 8.7 (L) 18 - 38 mg/dL    Comment: Performed at [facility] [address] [place], [place] [address]  Phosphorus     Status: Abnormal   Collection Time: 06/02/21  9:35 AM  Result Value Ref Range   Phosphorus 2.4 (L) 2.5 - 4.6 mg/dL    Comment: Performed at [facility], [place] [address] [place], [place] [address]  Glucose, capillary     Status: Abnormal   Collection Time: 06/02/21 12:11 PM  Result Value Ref Range   Glucose-Capillary 134 (H) 70 - 99 mg/dL    Comment: Glucose reference range applies only to samples taken after fasting for at least 8 hours.  Glucose, capillary     Status: Abnormal   Collection Time: 06/02/21  3:50 PM  Result Value Ref Range   Glucose-Capillary 107 (H) 70 - 99 mg/dL    Comment: Glucose reference range applies only to samples taken after fasting for at least 8 hours.  Glucose, capillary     Status: Abnormal   Collection Time: 06/02/21  8:33 PM  Result Value Ref Range   Glucose-Capillary 148 (H) 70 - 99 mg/dL    Comment: Glucose reference range applies only to samples taken after fasting for at least 8 hours.  Glucose, capillary     Status: Abnormal   Collection Time: 06/03/21  7:20 AM  Result Value Ref Range   Glucose-Capillary 123 (H) 70 - 99 mg/dL    Comment: Glucose reference range applies only to samples taken after fasting for at least 8 hours.  Glucose, capillary     Status: Abnormal   Collection Time: 06/03/21 11:48 AM  Result Value Ref Range   Glucose-Capillary 169 (H) 70 - 99 mg/dL    Comment: Glucose reference range applies only to samples taken after fasting for at least 8 hours.  Glucose, capillary  Status: Abnormal   Collection Time: 06/03/21  4:50 PM  Result Value Ref Range   Glucose-Capillary 118 (H) 70 - 99 mg/dL    Comment: Glucose reference range applies only to samples taken after fasting for at least 8 hours.  Glucose, capillary     Status: Abnormal   Collection Time: 06/03/21  9:47 PM  Result Value Ref Range   Glucose-Capillary 134 (H) 70 - 99 mg/dL    Comment: Glucose reference range applies only to samples taken after fasting for at least 8 hours.  CBC     Status: Abnormal   Collection Time: 06/04/21  3:15 AM  Result Value Ref Range   WBC 12.9 (H) 4.0 - 10.5 K/uL   RBC 3.18 (L)  3.87 - 5.11 MIL/uL   Hemoglobin 9.8 (L) 12.0 - 15.0 g/dL   HCT 30.2 (L) 36.0 - 46.0 %   MCV 95.0 80.0 - 100.0 fL   MCH 30.8 26.0 - 34.0 pg   MCHC 32.5 30.0 - 36.0 g/dL   RDW 12.8 11.5 - 15.5 %   Platelets 233 150 - 400 K/uL   nRBC 0.0 0.0 - 0.2 %    Comment: Performed at [facility], [place] [address] [place], [place] [address]  Comprehensive metabolic panel     Status: Abnormal   Collection Time: 06/04/21  3:15 AM  Result Value Ref Range   Sodium 134 (L) 135 - 145 mmol/L   Potassium 3.4 (L) 3.5 - 5.1 mmol/L   Chloride 96 (L) 98 - 111 mmol/L   CO2 30 22 - 32 mmol/L   Glucose, Bld 128 (H) 70 - 99 mg/dL    Comment: Glucose reference range applies only to samples taken after fasting for at least 8 hours.   BUN 13 8 - 23 mg/dL   Creatinine, Ser 0.34 (L) 0.44 - 1.00 mg/dL   Calcium 8.2 (L) 8.9 - 10.3 mg/dL   Total Protein 5.8 (L) 6.5 - 8.1 g/dL   Albumin 2.6 (L) 3.5 - 5.0 g/dL   AST 16 15 - 41 U/L   ALT 31 0 - 44 U/L   Alkaline Phosphatase 109 38 - 126 U/L   Total Bilirubin 0.6 0.3 - 1.2 mg/dL   GFR, Estimated >60 >60 mL/min    Comment: (NOTE) Calculated using the CKD-EPI Creatinine Equation (2021)    Anion gap 8 5 - 15    Comment: Performed at [facility], [place] [address] [place], [place] [address]  Glucose, capillary     Status: Abnormal   Collection Time: 06/04/21  7:50 AM  Result Value Ref Range   Glucose-Capillary 129 (H) 70 - 99 mg/dL    Comment: Glucose reference range applies only to samples taken after fasting for at least 8 hours.  Magnesium     Status: None   Collection Time: 06/04/21  8:25 AM  Result Value Ref Range   Magnesium 1.8 1.7 - 2.4 mg/dL    Comment: Performed at [facility], [place] [address], [place], [place] [address]  Glucose, capillary     Status: Abnormal   Collection Time: 06/04/21  8:59 AM  Result Value Ref Range   Glucose-Capillary 113 (H) 70 - 99 mg/dL    Comment: Glucose reference range  applies only to samples taken after fasting for at least 8 hours.  Glucose, capillary     Status: Abnormal   Collection Time: 06/04/21 11:58 AM  Result Value Ref Range   Glucose-Capillary 133 (  H) 70 - 99 mg/dL    Comment: Glucose reference range applies only to samples taken after fasting for at least 8 hours.  Glucose, capillary     Status: Abnormal   Collection Time: 06/04/21  3:33 PM  Result Value Ref Range   Glucose-Capillary 110 (H) 70 - 99 mg/dL    Comment: Glucose reference range applies only to samples taken after fasting for at least 8 hours.  Glucose, capillary     Status: Abnormal   Collection Time: 06/04/21  9:38 PM  Result Value Ref Range   Glucose-Capillary 136 (H) 70 - 99 mg/dL    Comment: Glucose reference range applies only to samples taken after fasting for at least 8 hours.  CBC     Status: Abnormal   Collection Time: 06/05/21  3:32 AM  Result Value Ref Range   WBC 12.7 (H) 4.0 - 10.5 K/uL   RBC 3.18 (L) 3.87 - 5.11 MIL/uL   Hemoglobin 9.9 (L) 12.0 - 15.0 g/dL   HCT 30.7 (L) 36.0 - 46.0 %   MCV 96.5 80.0 - 100.0 fL   MCH 31.1 26.0 - 34.0 pg   MCHC 32.2 30.0 - 36.0 g/dL   RDW 12.9 11.5 - 15.5 %   Platelets 293 150 - 400 K/uL   nRBC 0.0 0.0 - 0.2 %    Comment: Performed at [facility], [place] [address] [place], [place] [address]  Comprehensive metabolic panel Once     Status: Abnormal   Collection Time: 06/05/21  3:32 AM  Result Value Ref Range   Sodium 138 135 - 145 mmol/L   Potassium 3.9 3.5 - 5.1 mmol/L   Chloride 100 98 - 111 mmol/L   CO2 27 22 - 32 mmol/L   Glucose, Bld 151 (H) 70 - 99 mg/dL    Comment: Glucose reference range applies only to samples taken after fasting for at least 8 hours.   BUN 13 8 - 23 mg/dL   Creatinine, Ser 0.48 0.44 - 1.00 mg/dL   Calcium 8.6 (L) 8.9 - 10.3 mg/dL   Total Protein 5.9 (L) 6.5 - 8.1 g/dL   Albumin 2.5 (L) 3.5 - 5.0 g/dL   AST 13 (L) 15 - 41 U/L   ALT 25 0 - 44 U/L   Alkaline Phosphatase 139  (H) 38 - 126 U/L   Total Bilirubin 0.5 0.3 - 1.2 mg/dL   GFR, Estimated >60 >60 mL/min    Comment: (NOTE) Calculated using the CKD-EPI Creatinine Equation (2021)    Anion gap 11 5 - 15    Comment: Performed at [facility], [place] [address], [place], [place] [address]  Glucose, capillary     Status: Abnormal   Collection Time: 06/05/21  7:21 AM  Result Value Ref Range   Glucose-Capillary 143 (H) 70 - 99 mg/dL    Comment: Glucose reference range applies only to samples taken after fasting for at least 8 hours.    No results found.  Past Medical History:  Diagnosis Date   Anxiety    Arthritis    CAD (coronary artery disease)    nonobstructive CAD in LAD per 2013 CTA coronary morph   Cholelithiasis 04/06/2021   Depression    Diabetes mellitus    no meds taken now, checks cbg once or twice a week    Difficult intubation    had to use the glidescope due to small airway    Gallbladder abscess 05/02/2021   Gastrojejunal ulcer  09/27/2014   marginal ulcer   GERD (gastroesophageal reflux disease)    H/O gastric bypass    Heart murmur    with last visit, MD states "outgrown" murmur   Hemorrhoids, internal, with bleeding and Grade 2 prolapse 03/05/2013   All positions seen an anoscopy RP columns banded 03/05/2013 LL and RA banded 03/18/2013      History of endometrial cancer 2006 ---- S/P ABD. HYSTERECTOMY   STAGE I ENDOMETROID CARCINOMA   History of kidney stones    Hx of adenomatous colonic polyps 2011   BENIGN   Hyperlipidemia    no meds now   Hypertension    Pneumonia    Sleep apnea    no CPAP since surgery    Past Surgical History:  Procedure Laterality Date   ABDOMINAL HYSTERECTOMY  12-20-2004   TAH   BILATERAL BREAST REDUCTION  11-16-1999   BIOPSY  02/20/2021   Procedure: BIOPSY;  Surgeon: Daryel November, MD;  Location: [facility] ENDOSCOPY;  Service: Gastroenterology;;   Rosanne Gutting PYLORI N/A 05/27/2013   Procedure: Lauris Chroman;  Surgeon:  Shann Medal, MD;  Location: [facility] END[facility]COPY;  Service: General;  Laterality: N/A;   [place] N/A 05/31/2021   Procedure: LAPAROSCOPIC LYSIS OF ADHESIONS, PARTIAL CHOLECYSTECTOMY, DRAIN PLACEMENT, WEDGE LIVER BIOPSY;  Surgeon: Michael Boston, MD;  Location: [facility];  Service: General;  Laterality: N/A;   COLONOSCOPY     COLONOSCOPY N/A 04/25/2014   Procedure: COLONOSCOPY;  Surgeon: Gatha Mayer, MD;  Location: [facility] ENDOSCOPY;  Service: Endoscopy;  Laterality: N/A;   COLONOSCOPY WITH PROPOFOL N/A 07/26/2019   Procedure: COLONOSCOPY WITH PROPOFOL;  Surgeon: Gatha Mayer, MD;  Location: [facility];  Service: Endoscopy;  Laterality: N/A;   CYSTOSCOPY  09/06/2011   Procedure: CYSTOSCOPY;  Surgeon: Ailene Rud, MD;  Location: [facility];  Service: Urology;  Laterality: N/A;  LYNX SLING    ESOPHAGOGASTRODUODENOSCOPY (EGD) WITH PROPOFOL N/A 09/27/2014   Procedure: ESOPHAGOGASTRODUODENOSCOPY (EGD) WITH PROPOFOL;  Surgeon: Gatha Mayer, MD;  Location: [facility];  Service: Endoscopy;  Laterality: N/A;   ESOPHAGOGASTRODUODENOSCOPY (EGD) WITH PROPOFOL N/A 03/11/2016   Procedure: ESOPHAGOGASTRODUODENOSCOPY (EGD) WITH PROPOFOL;  Surgeon: Jerene Bears, MD;  Location: [facility];  Service: Endoscopy;  Laterality: N/A;   ESOPHAGOGASTRODUODENOSCOPY (EGD) WITH PROPOFOL Left 02/20/2021   Procedure: ESOPHAGOGASTRODUODENOSCOPY (EGD) WITH PROPOFOL;  Surgeon: Daryel November, MD;  Location: [facility];  Service: Gastroenterology;  Laterality: Left;   GASTRIC BYPASS  10/04/2013   GASTRIC ROUX-EN-Y N/A 10/04/2013   Procedure: LAPAROSCOPIC ROUX-EN-Y GASTRIC BYPASS WITH UPPER ENDOSCOPY;  Surgeon: Shann Medal, MD;  Location: [facility];  Service: General;  Laterality: N/A;   HEMORRHOID BANDING  02/2013   IR CHOLANGIOGRAM EXISTING TUBE  05/01/2021   IR PERC CHOLECYSTOSTOMY  03/06/2021   IR RADIOLOGIST EVAL & MGMT  04/12/2021   POLYPECTOMY     POLYPECTOMY   07/26/2019   Procedure: POLYPECTOMY;  Surgeon: Gatha Mayer, MD;  Location: [facility];  Service: Endoscopy;;   PUBOVAGINAL SLING  09/06/2011   Procedure: Gaynelle Arabian;  Surgeon: Ailene Rud, MD;  Location: [facility];  Service: Urology;  Laterality: N/A;   REDUCTION MAMMAPLASTY Bilateral 2001   SHOULDER ARTHROSCOPY Right 10/23/2017   Procedure: RIGHT SHOULDER ARTHROSCOPIC ROTATOR CUFF REPAIR VS. 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[employer]   Social Determinants of Health   Financial Resource Strain: Not on file  Food Insecurity: No Food Insecurity   Worried About Charity fundraiser in the Last Year: Never true   Arboriculturist in the Last Year: Never true  Transportation Needs: No Transportation Needs   Lack of Transportation (Medical): No   Lack of Transportation (Non-Medical): No  Physical Activity: Not on file  Stress: Not on file  Social Connections: Not on file   Intimate Partner Violence: Not on file    Family History  Adopted: Yes    Current Facility-Administered Medications  Medication Dose Route Frequency Provider Last Rate Last Admin   0.9 %  sodium chloride infusion  250 mL Intravenous PRN Michael Boston, MD 10 mL/hr at 06/03/21 1424 Restarted at 06/03/21 1424   acetaminophen (TYLENOL) tablet 1,000 mg  1,000 mg Oral Q6H Michael Boston, MD   1,000 mg at 06/05/21 0749   bisacodyl (DULCOLAX) suppository 10 mg  10 mg Rectal Daily PRN Michael Boston, MD       diphenhydrAMINE (BENADRYL) 12.5 MG/5ML elixir 12.5 mg  12.5 mg Oral Q6H PRN Michael Boston, MD       Or   diphenhydrAMINE (BENADRYL) injection 12.5 mg  12.5 mg Intravenous Q6H PRN Michael Boston, MD       enalaprilat (VASOTEC) injection 0.625-1.25 mg  0.625-1.25 mg Intravenous Q6H PRN Michael Boston, MD       feeding supplement (GLUCERNA SHAKE) (GLUCERNA SHAKE) liquid 237 mL  237 mL Oral BID BM Michael Boston, MD   237 mL at 06/03/21 1446   gabapentin (NEURONTIN) capsule 300 mg  300 mg Oral TID Michael Boston, MD   300 mg at 06/05/21 0749   HYDROmorphone (DILAUDID) injection 0.5-2 mg  0.5-2 mg Intravenous Q2H PRN Michael Boston, MD   1 mg at 06/05/21 0804   insulin aspart (novoLOG) injection 0-15 Units  0-15 Units Subcutaneous TID WC Michael Boston, MD   3 Units at 06/03/21 1242   insulin aspart (novoLOG) injection 0-5 Units  0-5 Units Subcutaneous QHS Michael Boston, MD       lactated ringers bolus 1,000 mL  1,000 mL Intravenous Q8H PRN Ria Redcay, Remo Lipps, MD       lip balm (CARMEX) ointment 1 application  1 application Topical BID Michael Boston, MD   1 application at 94/07/68 2120   magic mouthwash  15 mL Oral QID PRN Michael Boston, MD       magnesium hydroxide (MILK OF MAGNESIA) suspension 30 mL  30 mL Oral Daily PRN Michael Boston, MD       methocarbamol (ROBAXIN) 1,000 mg in dextrose 5 % 100 mL IVPB  1,000 mg Intravenous Q6H PRN Michael Boston, MD       methocarbamol (ROBAXIN) tablet 500-1,000 mg   500-1,000 mg Oral Q6H PRN Michael Boston, MD   500 mg at 06/05/21 0749   metoprolol tartrate (LOPRESSOR) injection 5 mg  5 mg Intravenous Q6H PRN Michael Boston, MD       ondansetron [facility]) injection 4 mg  4 mg Intravenous Q6H PRN Armandina Gemma, MD   4 mg at 06/01/21 0247   oxyCODONE (Oxy IR/ROXICODONE) immediate release tablet 5-10 mg  5-10 mg Oral Q4H PRN Michael Boston, MD   10 mg at 06/05/21 0748   pantoprazole (PROTONIX) EC tablet 40 mg  40 mg Oral Q1200 Michael Boston, MD   40 mg at 06/04/21 1741   phosphorus (K PHOS NEUTRAL) tablet 500 mg  500 mg Oral BID Michael Boston, MD   500 mg at 06/04/21 2117   piperacillin-tazobactam (ZOSYN) IVPB 3.375 g  3.375 g Intravenous Tor Netters, MD 12.5 mL/hr at 06/05/21 2641 Infusion Verify at 06/05/21 5830   polyethylene glycol (MIRALAX / GLYCOLAX) packet 17 g  17 g Oral Daily Michael Boston, MD       simethicone [facility]) chewable tablet 40 mg  40 mg Oral Q6H PRN Michael Boston, MD       sodium chloride flush (NS) 0.9 % injection 3 mL  3 mL Intravenous Gorden Harms, MD   3 mL at 06/04/21 1608   sodium chloride flush (NS) 0.9 % injection 3 mL  3 mL Intravenous PRN Michael Boston, MD         Allergies  Allergen Reactions   Nsaids     History ulcers.  H/o gastric bypass   Statins     Muscle aches    Signed: Morton Peters, MD, FACS, MASCRS Esophageal, Gastrointestinal & Colorectal Surgery Robotic and Minimally Invasive Surgery  [facility], [place]. [address] [place], [place] [address] [phone] Fax [phone] Main  CONTACT INFORMATION:  Weekday (9AM-5PM): Call [facility] main office at [phone]  Weeknight (5PM-9AM) or Weekend/Holiday: Check [email or web address] (password " [email or web address]") for General Surgery [facility] coverage  (Please, do not use SecureChat as it is not reliable communication to operating surgeons for immediate patient care)       06/05/2021, 8:07 AM

## 2021-06-05 NOTE — Plan of Care (Signed)
°  Problem: Nutrition: Goal: Adequate nutrition will be maintained Outcome: Adequate for Discharge   Problem: Elimination: Goal: Will not experience complications related to bowel motility Outcome: Adequate for Discharge   Problem: Skin Integrity: Goal: Risk for impaired skin integrity will decrease Outcome: Adequate for Discharge   Problem: Clinical Measurements: Goal: Postoperative complications will be avoided or minimized Outcome: Adequate for Discharge   Problem: Skin Integrity: Goal: Demonstration of wound healing without infection will improve Outcome: Adequate for Discharge   Discharge teaching done.  Written information given.  Pt going home with her daughter.

## 2021-06-05 NOTE — Progress Notes (Addendum)
[facility] Progress Note  CC:  Abdominal pain, s/p subtotal cholecystectomy with suspected biliary leak   Subjective: She is sitting up in the bed eating lunch. No N/V. She had increased right upper abdominal pain after perc drain was removed, pain is less now. She ambulated up in the hall. She is passing gas per the rectum. Last BM was yesterday. Discharge to home per Dr. Johney Maine, she is waiting for her daughter to pick her up.    Objective:  Vital signs in last 24 hours: Temp:  [99.1 F (37.3 C)-99.7 F (37.6 C)] 99.1 F (37.3 C) (02/07 0539) Pulse Rate:  [102-104] 104 (02/07 0539) Resp:  [15-20] 18 (02/07 0539) BP: (116-153)/(76-80) 122/76 (02/07 0539) SpO2:  [96 %] 96 % (02/07 0539) Last BM Date: 06/03/21 General:   Alert, well-developed in NAD Heart: Tachycardic. No murmur.  Pulm: Breath sounds clear throughout.  Abdomen: Soft, tenderness around the drain site area, drain was dc'd, drsg dry and intact. + BS x 4 quads. Laparoscopic incisions intact.  Extremities:  Without edema. Neurologic:  Alert and  oriented x 4. Grossly normal neurologically. Psych:  Alert and cooperative. Normal mood and affect.  Intake/Output from previous day: 02/06 0701 - 02/07 0700 In: 1769.1 [P.O.:960; I.V.:395; IV Piggyback:414.1] Out: 0  Intake/Output this shift: No intake/output data recorded.  Lab Results: Recent Labs    06/04/21 0315 06/05/21 0332  WBC 12.9* 12.7*  HGB 9.8* 9.9*  HCT 30.2* 30.7*  PLT 233 293   BMET Recent Labs    06/04/21 0315 06/05/21 0332  NA 134* 138  K 3.4* 3.9  CL 96* 100  CO2 30 27  GLUCOSE 128* 151*  BUN 13 13  CREATININE 0.34* 0.48  CALCIUM 8.2* 8.6*   LFT Recent Labs    06/05/21 0332  PROT 5.9*  ALBUMIN 2.5*  AST 13*  ALT 25  ALKPHOS 139*  BILITOT 0.5   PT/INR No results for input(s): LABPROT, INR in the last 72 hours. Hepatitis Panel No results for input(s): HEPBSAG, HCVAB, HEPAIGM, HEPBIGM in the last 72  hours.  NM HEPATOBILIARY LEAK (POST-SURGICAL)  Result Date: 06/04/2021 CLINICAL DATA:  Upper abdominal pain. Status post cholecystectomy. Evaluate for postop bile leak. EXAM: NUCLEAR MEDICINE HEPATOBILIARY IMAGING TECHNIQUE: Sequential images of the abdomen were obtained out to 60 minutes following intravenous administration of radiopharmaceutical. RADIOPHARMACEUTICALS:  5.4 mCi Tc-29m  Choletec IV COMPARISON:  None. FINDINGS: Prompt uptake and biliary excretion of activity by the liver is seen. No gallbladder activity is seen, consistent with prior cholecystectomy. Biliary activity passes promptly into small bowel, consistent with patent common bile duct. No evidence of extravasation or extravasation of biliary activity. Radiopharmaceutical activity is seen within the stomach on delayed images, consistent with bile reflux. IMPRESSION: Prior cholecystectomy. No evidence of bile leak or biliary obstruction. Bile reflux noted. Electronically Signed   By: Marlaine Hind M.D.   On: 06/04/2021 15:44    Assessment / Plan:  61) 61 year old female with a history of a Roux-en-Y gastric bypass in 2015 previously admitted to the hospital with a perforated gallbladder with a 20 cm abscess s/p percutaneous drainage 03/06/2021 treated with IV antibiotics then oral Augmentin x 2 weeks. Readmitted to the hospital s/p subtotal cholecystectomy 05/31/2021 with perc drain replacement, lysis of adhesions and liver biopsy.  Minimal drain output. HIDA scan 2/6 without evidence of a biliary leak. Perc drain removed by Dr. Johney Maine this morning.  LFTs stable.  She is afebrile.  WBC  12.9.  Tachycardic. HR 104. Temp 99.1. -Discharged home per Dr. Johney Maine -Patient to follow up with Dr. Johney Maine in 3 weeks, defer follow up CTAP to assess for any residual fluid collection  -Recommend CBC, CMP in 1 week    2) Hepatic steatosis confirmed by liver biopsy.    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  Management per surgery.  We will sign off  Chara Marquard N. Geri Seminole., M.D. [facility] Division of Gastroenterology

## 2021-06-07 ENCOUNTER — Telehealth: Payer: 59

## 2021-06-07 ENCOUNTER — Telehealth: Payer: Self-pay | Admitting: *Deleted

## 2021-06-07 ENCOUNTER — Encounter: Payer: Self-pay | Admitting: *Deleted

## 2021-06-07 NOTE — Telephone Encounter (Signed)
°  Care Management   Follow Up Note   06/07/2021 Name: Sandra [name] MRN: [id number] DOB: [birth year]  Referred by: [name], MD Reason for referral : Care Coordination (CCM RN CM Follow Up Telephone Outreach, surgery, DMII)  An unsuccessful telephone outreach was attempted today. The patient was referred to the case management team for assistance with care management and care coordination.   Follow Up Plan:  A HIPPA compliant phone message was left for the patient providing contact information and requesting a return call Will place request with scheduling care guide to contact patient to re-schedule today's missed CCM RN follow up telephone appointment if I do not hear back from patient by end of day  Oneta Rack, RN, BSN, [place] [phone]: direct office

## 2021-06-12 ENCOUNTER — Other Ambulatory Visit (HOSPITAL_COMMUNITY): Payer: Self-pay

## 2021-06-12 LAB — TOTAL BILIRUBIN, BODY FLUID

## 2021-06-12 MED ORDER — OXYCODONE HCL 5 MG PO TABS
ORAL_TABLET | ORAL | 0 refills | Status: DC
  Filled 2021-06-12: qty 20, 5d supply, fill #0

## 2021-06-13 ENCOUNTER — Other Ambulatory Visit (HOSPITAL_COMMUNITY): Payer: Self-pay

## 2021-06-18 ENCOUNTER — Telehealth: Payer: Self-pay | Admitting: Internal Medicine

## 2021-06-18 ENCOUNTER — Other Ambulatory Visit (HOSPITAL_COMMUNITY): Payer: Self-pay

## 2021-06-18 MED ORDER — HYDROCORTISONE ACETATE 25 MG RE SUPP
RECTAL | 1 refills | Status: DC
Start: 2021-06-18 — End: 2023-01-13
  Filled 2021-06-18: qty 12, 6d supply, fill #0

## 2021-06-18 NOTE — Telephone Encounter (Signed)
Pt stated that she recently had her Gallbladder removed and was hospitalized from 05/31/2021 to 06/06/2021: Pt states that she is having some diarrhea but her main concern is her Hemorrhoids: Pt states that they are "raging" Pt encouraged to use wet wipes, OTC preparation  H  suppository, and warm water bath to alleviate pain and discomfort. Pt is requesting prescription for Hemorrhoid Suppository: Please advise

## 2021-06-18 NOTE — Telephone Encounter (Signed)
Pt made aware that prescription has been sent in.  Pt Warn her that sometimes can be very expensive ( I don't know why) and if so the cream will be cheaper: Pt verbalized understanding with all questions answered.

## 2021-06-18 NOTE — Telephone Encounter (Signed)
Inbound call from patient stated that she had her Gallbladder removed on 2/2 and since then she has had diarrhea and she is having issues with Hemorrhoids. Patient is seeking advice if there is anything that scan take to help the pain. Please advise.

## 2021-06-18 NOTE — Telephone Encounter (Signed)
I sent in an Rx   Warn her that sometimes can be very expensive ( I don't know why) and if so the cream will be cheaper

## 2021-06-20 ENCOUNTER — Other Ambulatory Visit (HOSPITAL_COMMUNITY): Payer: Self-pay

## 2021-06-25 NOTE — Telephone Encounter (Signed)
RS 07/03/21  [facility]  Direct Dial: [phone]

## 2021-07-03 ENCOUNTER — Ambulatory Visit: Payer: 59 | Admitting: *Deleted

## 2021-07-03 DIAGNOSIS — K651 Peritoneal abscess: Secondary | ICD-10-CM

## 2021-07-03 DIAGNOSIS — E119 Type 2 diabetes mellitus without complications: Secondary | ICD-10-CM

## 2021-07-03 NOTE — Chronic Care Management (AMB) (Signed)
Care Management    RN Visit Note  07/03/2021 Name: Sandra [name] MRN: [id number] DOB: 1960/08/11  Subjective: Sandra [name] is a 61 y.o. year old female who is a primary care patient of Burns, Claudina Lick, MD. The care management team was consulted for assistance with disease management and care coordination needs.    Engaged with patient by telephone for follow up visit in response to provider referral for case management and/or care coordination services.   Consent to Services:   Ms. [name] was given information about Care Management services 03/21/21 including:  Care Management services includes personalized support from designated clinical staff supervised by her physician, including individualized plan of care and coordination with other care providers 24/7 contact phone numbers for assistance for urgent and routine care needs. The patient may stop case management services at any time by phone call to the office staff.  Patient agreed to services and consent obtained 03/21/22   Assessment: Review of patient past medical history, allergies, medications, health status, including review of consultants reports, laboratory and other test data, was performed as part of comprehensive evaluation and provision of chronic care management services.   SDOH (Social Determinants of Health) assessments and interventions performed:  SDOH Interventions    Flowsheet Row Most Recent Value  SDOH Interventions   Food Insecurity Interventions Intervention Not Indicated  [Continues to deny food insecurity]  Transportation Interventions Intervention Not Indicated  [Daughter continues providing transportation while patient recuperates from recent surgery]     Care Plan  Allergies  Allergen Reactions   Nsaids     History ulcers.  H/o gastric bypass   Statins     Muscle aches   Outpatient Encounter Medications as of 07/03/2021  Medication Sig   acetaminophen (TYLENOL) 500 MG tablet Take 500  mg by mouth every 6 (six) hours as needed for moderate pain.   Blood Glucose Monitoring Suppl (FREESTYLE LITE) w/Device KIT Use up to 4 times daily to check blood glucose daily as directed.   COLLAGEN PO Take 1 Scoop by mouth daily.   gabapentin (NEURONTIN) 300 MG capsule Take 1 capsule by mouth 2 times daily.   glucose blood (FREESTYLE LITE) test strip Use daily as instructed.   glucose blood (FREESTYLE LITE) test strip Use up to 4 times daily to check blood glucose as directed.   hydrocortisone (ANUSOL-HC) 25 MG suppository Insert rectally 2 times a day as needed   Lancets (FREESTYLE) lancets Use 1 lancet up to 4 times daily to check blood glucose as directed.   Multiple Vitamins-Minerals (MULTIVITAMIN PO) Take 2 tablets by mouth daily.    omeprazole (PRILOSEC) 40 MG capsule Take 1 capsule by mouth 2 times daily before a meal. Open capsule and sprinkle in water, other liquid or applesauce (Patient taking differently: Take 40 mg by mouth daily as needed (acid reflux).)   ondansetron (ZOFRAN-ODT) 4 MG disintegrating tablet Take 1 - 2 tablets by mouth every 8 hours as needed for nausea or vomiting.   oxyCODONE (OXY IR/ROXICODONE) 5 MG immediate release tablet Take 1 - 2 tablets by mouth every 4 hours as needed for moderate pain or severe pain.   oxyCODONE (OXY IR/ROXICODONE) 5 MG immediate release tablet Take 1 tablet (5 mg total) by mouth every 6 (six) hours   polyethylene glycol powder (MIRALAX) 17 GM/SCOOP powder Take 17 g by mouth daily.   Sodium Chloride Flush (NORMAL SALINE FLUSH) 0.9 % SOLN Flush drain once daily as directed   No facility-administered  encounter medications on file as of 07/03/2021.   Patient Active Problem List   Diagnosis Date Noted   Hypophosphatemia 06/03/2021   Perforation of gallbladder in cholecystitis s/p subtotal cholecystectomy 05/31/2021 05/31/2021   Gallbladder abscess 05/02/2021   Abscess 04/06/2021   Intra-abdominal abscess ([place]) 03/06/2021   Normocytic anemia  03/06/2021   Hypokalemia 03/06/2021   Hyponatremia 03/06/2021   Thrombocytosis 03/06/2021   Moderate protein-calorie malnutrition ([place]) 03/06/2021   Abdominal pain 02/17/2021   Right carpal tunnel syndrome 11/10/2020   Labral tear of right hip joint 11/10/2020   Chronic right hip pain 04/10/2020   Nonallopathic lesion of sacral region 08/05/2019   Greater trochanteric bursitis, right 08/05/2019   Benign neoplasm of descending colon    Low back pain 05/27/2019   Hematoma of right hip 05/07/2019   Trigger finger, right index finger 02/17/2019   Anxiety 11/12/2018   Left lateral epicondylitis 05/14/2018   Left elbow pain 05/04/2018   Right rotator cuff tear 08/20/2017   Cervical disc disorder with radiculopathy of cervical region 04/25/2016   Nonallopathic lesion of cervical region 04/17/2016   Nonallopathic lesion of thoracic region 04/17/2016   Nonallopathic lesion of rib cage 04/17/2016   Nonallopathic lesion of lumbosacral region 04/17/2016   Degenerative cervical disc 03/29/2016   Degenerative arthritis of right knee 03/29/2016   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   Adjustment disorder with mixed anxiety and depressed mood 07/14/2014   Personal history of colonic polyps    History of Roux-en-Y gastric bypass, 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   Chronic constipation 03/05/2013   Coronary atherosclerosis of native coronary artery 08/27/2011   History of uterine cancer 08/15/2011   Type 2 diabetes mellitus ([place]) 08/15/2011   Hypercholesterolemia 08/15/2011   Conditions to be addressed/monitored: HLD and DMII; intraabdominal abscess with multiple recent surgical procedures  Care Plan : RN Care Manager Plan of Care  Updates made by Knox Royalty, RN since 07/03/2021 12:00 AM     Problem: Chronic Disease Management Needs   Priority: High     Long-Range Goal: Ongoing adherence to established plan of care for long term chronic disease management   Start Date: 03/30/2021  Expected End Date:  03/30/2022  Priority: High  Note:   Current Barriers:  Chronic Disease Management support and education needs related to HLD and DMII, recent surgery Recent hospitalization November 8-11, 2022 for intraabdominal abscess secondary to perforated gallbladder Planned hospitalization February 3-7, 2023 for lap chole  RNCM Clinical Goal(s):  Patient will demonstrate ongoing health management independence as evidenced by adherence to established plan of care for DMII/ recent surgery        through collaboration with RN Care manager, provider, and care team.   Interventions: 1:1 collaboration with primary care provider regarding development and update of comprehensive plan of care as evidenced by provider attestation and co-signature Inter-disciplinary care team collaboration (see longitudinal plan of care) Evaluation of current treatment plan related to  self management and patient's adherence to plan as established by provider 07/03/21: Reviewed recent (February 2023) hospitalization with patient; current clinical condition: reports doing overall well in recuperation; however, she reports today a persistent cough with URI signs/ symptoms; reports signs/ symptoms cough/ cold x 10 days, states she had COVID and flu testing which both resulted negative; reported fever > 102 x "one day" which has since resolved; expectorating "thick yellow" phlegm; has been taking mucinex and robitussin OTC with minimal improvement-- advised patient to consider making urgent PCP office visit vs. going to [facility]: she will consider  SDOH updated; no new/ unmet needs identified Pain assessment updated: denies acute/ chronic pain today Falls assessment updated: reports one new fall without injury post-February hospital discharge; currently not using assistive devices; previously provided education around fall risks/ prevention reinforced Confirmed no recent medication changes/ concerns: patient continues to self-manage medications  and endorses adherence to prescribed medications Confirmed patient has scheduled surgical provider follow up post- February surgery, scheduled for tomorrow; confirmed patient aware/ has plans to attend as scheduled Today patient reports that "everything I eat since I had surgery goes straight through me, I either have to immediately go to bathroom for BM or end up habving diarrhea: encouraged pateint to report this to surgeon at time of scheduled office visit tomorrow- she will do Confirmed no concerns around surgical wound; reports "seems to be healing good"  Diabetes:  (Status: 07/03/21: Goal on Track (progressing): YES.) Long Term Goal   Lab Results  Component Value Date   HGBA1C 5.8 (H) 05/18/2021  Provided education to patient about basic DM disease process; Reviewed prescribed diet with patient low carbohydrate, low sugar, heart healthy, low cholesterol; Counseled on importance of regular laboratory monitoring as prescribed;        Discussed plans with patient for ongoing care management follow up and provided patient with direct contact information for care management team;      Review of patient status, including review of consultants reports, relevant laboratory and other test results, and medications completed;       Confirmed patient monitors/ records blood sugars at home QD fasting: reviewed with patient today, she reports consistent values post- recent hospital discharge between 1130-140, with on isolated high of 159; again reports on days fasting blood sugars is > 150, she re-checks in afternoon, after eating, which she has not needed to do recently, as fasting blood sugars are "in good range;" today she denies low blood sugars that she "knows of;" however, given her decreased appetite/ inability to hold food after eating post- recent surgery, we reviewed signs/ symptoms low blood sugars along with corresponding action plan Confirmed patient trying to be adherent to prescribed diet:  positive reinforcement provided with encouragement to continue efforts; she is currently using nutritional supplementation post-surgery to assist with healing/ post- op nutritional needs in setting of decreased appetite and "food going straight through me"  Recent surgery for intra-abdominal abscess  (Status: 07/03/21: Goal on Track (progressing): YES.) Long Term Goal  Evaluation of current treatment plan related to  surgical follow up plan ,  self-management and patient's adherence to plan as established by provider. Discussed plans with patient for ongoing care management follow up and provided patient with direct contact information for care management team Reviewed upcoming provider appointments:  scheduled to visit with surgical provider tomorrow for post-op visit; verbalizes plans to attend as scheduled Confirmed no concerns with post-op wound or pain Discussed current plan for ongoing surgical follow up: she reports "hope this is the last surgery I need"  Patient Goals/Self-Care Activities: As evidenced by review of EHR, collaboration with care team, and patient reporting during CCM RN CM outreach,  Patient [place] will: Consider making an appointment with Dr. Quay Burow for evaluation of the cough you reported today that you have been experiencing for 10 days Take medications as prescribed Attend all scheduled provider appointments Call pharmacy for medication refills Call provider office for new concerns or questions Continue to check fasting (first thing in the morning, before eating) blood sugars at home- great job checking your blood sugars  after eating on days it is higher than 150 in the morning Continue to follow heart healthy, low salt, low cholesterol, carbohydrate-modified, low sugar diet     Plan:  Telephone follow up appointment with care management team member scheduled for:  Tuesday, July 17, 2021 at 10:45 am The patient has been provided with contact information for the care  management team and has been advised to call with any health related questions or concerns  Oneta Rack, RN, BSN, [place] [phone]: direct office

## 2021-07-03 NOTE — Patient Instructions (Signed)
Visit Information ? ?[name], thank you for taking time to talk with me today. Please don't hesitate to contact me if I can be of assistance to you before our next scheduled telephone appointment. ? ?Below are the goals we discussed today:  ?Patient Self-Care Activities: ?Patient Sandra [name] will: ?Consider making an appointment with Dr. Quay Burow for evaluation of the cough you reported today that you have been experiencing for 10 days ?Take medications as prescribed ?Attend all scheduled provider appointments ?Call pharmacy for medication refills ?Call provider office for new concerns or questions ?Continue to check fasting (first thing in the morning, before eating) blood sugars at home- great job checking your blood sugars after eating on days it is higher than 150 in the morning ?Continue to follow heart healthy, low salt, low cholesterol, carbohydrate-modified, low sugar diet ? ?Our next scheduled telephone follow up visit/ appointment is scheduled on: Tuesday, July 17, 2021 at 10:45 am- This is a PHONE [place] appointment ? ?If you need to cancel or re-schedule our visit, please call [phone] and our care guide team will be happy to assist you. ?  ?I look forward to hearing about your progress. ?  ?Oneta Rack, RN, BSN, CCRN Alumnus ?[place] ?[phone]: direct office ? ?If you are experiencing a Mental Health or [place] or need someone to talk to, please  ?call the Suicide and Crisis Lifeline: 988 ?call the [place] National Suicide Prevention Lifeline: 618 871 1038 or TTY: 859-363-7183 TTY 364-731-4345) to talk to a trained counselor ?call 1-800-273-TALK (toll free, 24 hour hotline) ?go to [facility] [address], [place] [phone]) ?call 911  ? ?Patient verbalizes understanding of instructions and care plan provided today and agrees to view in [place]. Active MyChart status confirmed with  patient ? ?Cough, Adult ?A cough helps to clear your throat and lungs. A cough may be a sign of an illness or another medical condition. ?An acute cough may only last 2-3 weeks, while a chronic cough may last 8 or more weeks. ?Many things can cause a cough. They include: ?Germs (viruses or bacteria) that attack the airway. ?Breathing in things that bother (irritate) your lungs. ?Allergies. ?Asthma. ?Mucus that runs down the back of your throat (postnasal drip). ?Smoking. ?Acid backing up from the stomach into the tube that moves food from the mouth to the stomach (gastroesophageal reflux). ?Some medicines. ?Lung problems. ?Other medical conditions, such as heart failure or a blood clot in the lung (pulmonary embolism). ?Follow these instructions at home: ?Medicines ?Take over-the-counter and prescription medicines only as told by your doctor. ?Talk with your doctor before you take medicines that stop a cough (cough suppressants). ?Lifestyle ? ?Do not smoke, and try not to be around smoke. Do not use any products that contain nicotine or tobacco, such as cigarettes, e-cigarettes, and chewing tobacco. If you need help quitting, ask your doctor. ?Drink enough fluid to keep your pee (urine) pale yellow. ?Avoid caffeine. ?Do not drink alcohol if your doctor tells you not to drink. ?General instructions ? ?Watch for any changes in your cough. Tell your doctor about them. ?Always cover your mouth when you cough. ?Stay away from things that make you cough, such as perfume, candles, campfire smoke, or cleaning products. ?If the air is dry, use a cool mist vaporizer or humidifier in your home. ?If your cough is worse at night, try using extra pillows to raise your head up higher while you sleep. ?  Rest as needed. ?Keep all follow-up visits as told by your doctor. This is important. ?Contact a doctor if: ?You have new symptoms. ?You cough up pus. ?Your cough does not get better after 2-3 weeks, or your cough gets worse. ?Cough  medicine does not help your cough and you are not sleeping well. ?You have pain that gets worse or pain that is not helped with medicine. ?You have a fever. ?You are losing weight and you do not know why. ?You have night sweats. ?Get help right away if: ?You cough up blood. ?You have trouble breathing. ?Your heartbeat is very fast. ?These symptoms may be an emergency. Do not wait to see if the symptoms will go away. Get medical help right away. Call your local emergency services (911 in the [place] Do not drive yourself to the hospital. ?Summary ?A cough helps to clear your throat and lungs. Many things can cause a cough. ?Take over-the-counter and prescription medicines only as told by your doctor. ?Always cover your mouth when you cough. ?Contact a doctor if you have new symptoms or you have a cough that does not get better or gets worse. ?This information is not intended to replace advice given to you by your health care provider. Make sure you discuss any questions you have with your health care provider. ?Document Revised: 06/04/2019 Document Reviewed: 05/04/2018 ?Elsevier Patient Education ? 2022 [place]. ? ? ? ?

## 2021-07-04 ENCOUNTER — Other Ambulatory Visit (HOSPITAL_COMMUNITY): Payer: Self-pay

## 2021-07-04 ENCOUNTER — Other Ambulatory Visit: Payer: Self-pay | Admitting: Surgery

## 2021-07-04 ENCOUNTER — Other Ambulatory Visit (HOSPITAL_COMMUNITY): Payer: Self-pay | Admitting: Surgery

## 2021-07-04 DIAGNOSIS — K812 Acute cholecystitis with chronic cholecystitis: Secondary | ICD-10-CM | POA: Diagnosis not present

## 2021-07-04 DIAGNOSIS — Z9884 Bariatric surgery status: Secondary | ICD-10-CM | POA: Diagnosis not present

## 2021-07-04 DIAGNOSIS — K822 Perforation of gallbladder: Secondary | ICD-10-CM | POA: Diagnosis not present

## 2021-07-04 DIAGNOSIS — K651 Peritoneal abscess: Secondary | ICD-10-CM

## 2021-07-04 DIAGNOSIS — R1084 Generalized abdominal pain: Secondary | ICD-10-CM | POA: Diagnosis not present

## 2021-07-04 DIAGNOSIS — K529 Noninfective gastroenteritis and colitis, unspecified: Secondary | ICD-10-CM

## 2021-07-04 DIAGNOSIS — K810 Acute cholecystitis: Secondary | ICD-10-CM

## 2021-07-04 DIAGNOSIS — E440 Moderate protein-calorie malnutrition: Secondary | ICD-10-CM | POA: Diagnosis not present

## 2021-07-04 MED ORDER — FERROUS SULFATE 325 (65 FE) MG PO TBEC
1.0000 | DELAYED_RELEASE_TABLET | Freq: Every day | ORAL | 11 refills | Status: DC
  Filled 2021-07-04: qty 100, 100d supply, fill #0

## 2021-07-09 ENCOUNTER — Other Ambulatory Visit: Payer: Self-pay

## 2021-07-09 ENCOUNTER — Ambulatory Visit (HOSPITAL_COMMUNITY)
Admission: RE | Admit: 2021-07-09 | Discharge: 2021-07-09 | Disposition: A | Discharge status: Home / Self Care | Payer: 59 | Source: Ambulatory Visit | Attending: Surgery | Admitting: Surgery

## 2021-07-09 DIAGNOSIS — I700 Atherosclerosis of aorta: Secondary | ICD-10-CM | POA: Diagnosis not present

## 2021-07-09 DIAGNOSIS — K529 Noninfective gastroenteritis and colitis, unspecified: Secondary | ICD-10-CM | POA: Diagnosis not present

## 2021-07-09 DIAGNOSIS — Z9884 Bariatric surgery status: Secondary | ICD-10-CM | POA: Diagnosis not present

## 2021-07-09 DIAGNOSIS — K810 Acute cholecystitis: Secondary | ICD-10-CM | POA: Diagnosis not present

## 2021-07-09 DIAGNOSIS — K822 Perforation of gallbladder: Secondary | ICD-10-CM | POA: Diagnosis not present

## 2021-07-09 DIAGNOSIS — K651 Peritoneal abscess: Secondary | ICD-10-CM | POA: Insufficient documentation

## 2021-07-09 MED ORDER — IOHEXOL 300 MG/ML  SOLN
100.0000 mL | Freq: Once | INTRAMUSCULAR | Status: AC | PRN
  Administered 2021-07-09: 100 mL via INTRAVENOUS

## 2021-07-09 MED ORDER — SODIUM CHLORIDE (PF) 0.9 % IJ SOLN
INTRAMUSCULAR | Status: AC
  Filled 2021-07-09: qty 50

## 2021-07-11 ENCOUNTER — Ambulatory Visit (INDEPENDENT_AMBULATORY_CARE_PROVIDER_SITE_OTHER): Payer: 59

## 2021-07-11 ENCOUNTER — Ambulatory Visit: Payer: 59 | Admitting: Family Medicine

## 2021-07-11 ENCOUNTER — Other Ambulatory Visit: Payer: Self-pay

## 2021-07-11 ENCOUNTER — Ambulatory Visit: Payer: Self-pay

## 2021-07-11 VITALS — BP 130/76 | HR 91 | Ht 62.0 in | Wt 128.0 lb

## 2021-07-11 DIAGNOSIS — M9904 Segmental and somatic dysfunction of sacral region: Secondary | ICD-10-CM | POA: Diagnosis not present

## 2021-07-11 DIAGNOSIS — M9902 Segmental and somatic dysfunction of thoracic region: Secondary | ICD-10-CM

## 2021-07-11 DIAGNOSIS — S83422A Sprain of lateral collateral ligament of left knee, initial encounter: Secondary | ICD-10-CM

## 2021-07-11 DIAGNOSIS — M9903 Segmental and somatic dysfunction of lumbar region: Secondary | ICD-10-CM

## 2021-07-11 DIAGNOSIS — M25562 Pain in left knee: Secondary | ICD-10-CM

## 2021-07-11 DIAGNOSIS — M9901 Segmental and somatic dysfunction of cervical region: Secondary | ICD-10-CM

## 2021-07-11 DIAGNOSIS — M9908 Segmental and somatic dysfunction of rib cage: Secondary | ICD-10-CM

## 2021-07-11 NOTE — Patient Instructions (Addendum)
Thank you for coming in today.  ? ?Wear the hinged knee brace daily especially when out of the house for the next 4 weeks. ? ?Ice 20 minutes 2 times daily. Usually after activity and before bed.  ? ?Please get an Xray today before you leave  ? ?See me again in 4-6 weeks ?

## 2021-07-11 NOTE — Assessment & Plan Note (Signed)
Partial noted.  Discussed with patient about bracing and given a hinged brace.  Would like to avoid doing any true immobilizer secondary to patient already has been deconditioning from recent hospitalization.  Patient is going to continue with some home exercises and icing regimen.  Follow-up again in 4 to 6 weeks to make sure patient has been appropriately.  X-ray pending for any other occult fracture. ?

## 2021-07-11 NOTE — Progress Notes (Signed)
?Charlann Boxer D.O. ?[facility] ?[place] ?Phone: [phone] ?Subjective:   ?I, Jacqualin Combes, am serving as a scribe for Dr. Hulan Saas. ? ?This visit occurred during the SARS-CoV-2 public health emergency.  Safety protocols were in place, including screening questions prior to the visit, additional usage of staff PPE, and extensive cleaning of exam room while observing appropriate contact time as indicated for disinfecting solutions.  ? ? ?I'm seeing this patient by the request  of:  [name], MD ? ?CC: Back and neck pain follow-up ? ?[id number]  ?Sandra [name] is a 61 y.o. female coming in with complaint of back and neck pain. OMT 04/10/2022. Patient states that she has had increase in back pain. Pain started after having drain in and flexing spine to be more comfortable. Was also head butted last week by a pit bull running full speed at her. Pain just distal to L knee. The knee feels like it wants to give away and this has increased her back pain.  ? ?Medications patient has been prescribed: None ? ?Taking: ? ? ?  ? ?Reviewed prior external information including notes and imaging from previsou exam, outside providers and external EMR if available.  ? ?As well as notes that were available from care everywhere and other healthcare systems. ? ?Past medical history, social, surgical and family history all reviewed in electronic medical record.  No pertanent information unless stated regarding to the chief complaint.  ? ?Past Medical History:  ?Diagnosis Date  ? Anxiety   ? Arthritis   ? CAD (coronary artery disease)   ? nonobstructive CAD in LAD per 2013 CTA coronary morph  ? Cholelithiasis 04/06/2021  ? Depression   ? Diabetes mellitus   ? no meds taken now, checks cbg once or twice a week   ? Difficult intubation   ? had to use the glidescope due to small airway   ? Gallbladder abscess 05/02/2021  ? 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 marginal ulcer  ?  GERD (gastroesophageal reflux disease)   ? H/O gastric bypass   ? Heart murmur   ? with last visit, MD states "outgrown" murmur  ? Hemorrhoids, internal, with bleeding and Grade 2 prolapse 03/05/2013  ? All positions seen an anoscopy RP columns banded 03/05/2013 LL and RA banded 03/18/2013     ? History of endometrial cancer 2006 ---- S/P ABD. HYSTERECTOMY  ? STAGE I ENDOMETROID CARCINOMA  ? History of kidney stones   ? Hx of adenomatous colonic polyps 2011  ? BENIGN  ? Hyperlipidemia   ? no meds now  ? Hypertension   ? Pneumonia   ? Sleep apnea   ? no CPAP since surgery  ?  ?Allergies  ?Allergen Reactions  ? Nsaids   ?  History ulcers.  H/o gastric bypass  ? Statins   ?  Muscle aches  ? ? ? ?Review of Systems: ? No headache, visual changes, nausea, vomiting, diarrhea, constipation, dizziness, abdominal pain, skin rash, fevers, chills, night sweats, weight loss, swollen lymph nodes, body aches, joint swelling, chest pain, shortness of breath, mood changes. POSITIVE muscle aches ? ?Objective  ?Blood pressure 130/76, pulse 91, height '5\' 2"'$  (1.575 m), weight 128 lb (58.1 kg), SpO2 98 %. ?  ?General: No apparent distress alert and oriented x3 mood and affect normal, dressed appropriately.  ?HEENT: Pupils equal, extraocular movements intact  ?Respiratory: Patient's speak in full sentences and does not appear short of breath  ?  Cardiovascular: No lower extremity edema, non tender, no erythema  ?Back exam does have some loss of lordosis.  Patient neck exam does have some loss of lordosis as well.  Tenderness to palpation diffusely.  Patient is walking significantly antalgic favoring the left knee ?Knee exam shows the patient is tender to palpation over the lateral aspect of the knee.  Patient does have some gapping of the LCL but it feels like at baseline.  Mild crepitus noted. ? ?Limited muscular skeletal ultrasound was performed and interpreted by Hulan Saas, M  ?Limited musculoskeletal ultrasound of patient's left  knee shows patient does have hypoechoic changes surrounding the LCL distally.  I do not see a true cortical defect noted at the moment.  Patient does have some mild hypoechoic changes along the lateral aspect of the tibial plateau. ? ?Osteopathic findings ? ?C2 flexed rotated and side bent right ?C6 flexed rotated and side bent left ?T3 extended rotated and side bent right inhaled rib ?T8 extended rotated and side bent left ?L2 flexed rotated and side bent right ?Sacrum right on right ? ? ? ? ?  ?Assessment and Plan: ? ?Tear of LCL (lateral collateral ligament) of knee, left, initial encounter ?Partial noted.  Discussed with patient about bracing and given a hinged brace.  Would like to avoid doing any true immobilizer secondary to patient already has been deconditioning from recent hospitalization.  Patient is going to continue with some home exercises and icing regimen.  Follow-up again in 4 to 6 weeks to make sure patient has been appropriately.  X-ray pending for any other occult fracture.  ? ?Nonallopathic problems ? ?Decision today to treat with OMT was based on Physical Exam ? ?After verbal consent patient was treated with HVLA, ME, FPR techniques in cervical, rib, thoracic, lumbar, and sacral  areas ? ?Patient tolerated the procedure well with improvement in symptoms ? ?Patient given exercises, stretches and lifestyle modifications ? ?See medications in patient instructions if given ? ?Patient will follow up in 4-8 weeks ? ?  ? ? ?The above documentation has been reviewed and is accurate and complete Sandra Pulley, DO ? ? ? ?  ? ? Note: This dictation was prepared with Dragon dictation along with smaller phrase technology. Any transcriptional errors that result from this process are unintentional.    ?  ?  ? ?

## 2021-07-17 ENCOUNTER — Ambulatory Visit: Payer: 59 | Admitting: *Deleted

## 2021-07-17 DIAGNOSIS — E119 Type 2 diabetes mellitus without complications: Secondary | ICD-10-CM

## 2021-07-17 DIAGNOSIS — E7800 Pure hypercholesterolemia, unspecified: Secondary | ICD-10-CM

## 2021-07-17 DIAGNOSIS — K651 Peritoneal abscess: Secondary | ICD-10-CM

## 2021-07-17 NOTE — Patient Instructions (Signed)
Visit Information ? ?Robyne, thank you for taking time to talk with me today. Please don't hesitate to contact me if I can be of assistance to you before our next scheduled telephone appointment ? ?Below are the goals we discussed today:  ?Patient Self-Care Activities: ?Patient Lanyla will: ?Take medications as prescribed ?Attend all scheduled provider appointments ?Call pharmacy for medication refills ?Call provider office for new concerns or questions ?Continue to check fasting (first thing in the morning, before eating) blood sugars at home- great job checking your blood sugars after eating on days it is higher than 150 in the morning ?Continue to follow heart healthy, low salt, low cholesterol, carbohydrate-modified, low sugar diet ?Keep up the great work preventing falls ?Keep your surgeon and your doctors updated on your recuperation as you consider going back to work ? ?Our next scheduled telephone follow up visit/ appointment is scheduled on: Wednesday, October 31, 2021 at 11:30 am- This is a PHONE [place] appointment ? ?If you need to cancel or re-schedule our visit, please call [phone] and our care guide team will be happy to assist you. ?  ?I look forward to hearing about your progress. ?  ?Oneta Rack, RN, BSN, CCRN Alumnus ?[place] ?([phone]: direct office ? ?If you are experiencing a Mental Health or [place] or need someone to talk to, please  ?call the Suicide and Crisis Lifeline: 988 ?call the [place] National Suicide Prevention Lifeline: 325-581-0410 or TTY: 859-336-1801 TTY 4780658573) to talk to a trained counselor ?call 1-800-273-TALK (toll free, 24 hour hotline) ?go to [facility] [address], [place] [phone]) ?call 911  ? ?Patient verbalizes understanding of instructions and care plan provided today and agrees to view in [place]. Active MyChart status confirmed with  patient ? ?Dumping Syndrome ?Dumping syndrome is a combination of symptoms that occur when food passes through the stomach too quickly. This is most often caused by surgery for weight loss (bariatric) or surgery of the stomach (gastric). There are two types of dumping syndrome, and some people have both types: ?Early dumping syndrome causes symptoms that occur soon after eating (10-30 minutes). ?Late dumping syndrome causes symptoms that occur a few hours after eating (2-3 hours). ?When you eat and drink, your stomach needs time to digest everything. With dumping syndrome, undigested food and stomach fluids move too quickly into the small intestine, causing symptoms of early dumping syndrome. In other cases, large amounts of sugar move into the small intestine, which causes the pancreas to release too much of the hormone insulin. As a result, your blood sugar can get too low (hypoglycemia), and you may get symptoms of late dumping syndrome. ?What are the causes? ?Early dumping syndrome is caused by large amounts of undigested food and liquids passing into the upper part of your small intestine. ?Late dumping syndrome is caused by large amounts of sugar from your diet moving into your small intestine. ?What increases the risk? ?Certain types of stomach surgery can lead to dumping syndrome. You may be at risk for dumping syndrome if you have had: ?Surgery for ulcers or stomach cancer. ?Weight loss surgery (bariatric surgery). ?Surgery to treat severe heartburn (gastroesophageal reflux). ?What are the signs or symptoms? ?Early dumping syndrome ?Symptoms of early dumping syndrome may include: ?Nausea. ?Fullness or bloating. ?Cramps. ?Vomiting. ?Feeling flushed. ?Sweating. ?Feeling dizzy. ?A fast or irregular heartbeat (palpitations). ?Diarrhea. ?Headache. ?Late dumping syndrome ?Symptoms of late dumping syndrome may include: ?  Dizziness. ?Weakness. ?Sweating. ?Palpitations. ?Hunger. ?Confusion. ?How is this  diagnosed? ?This condition may be diagnosed based on: ?Your symptoms and medical history. ?A physical exam. ?Tests, such as: ?A modified glucose tolerance test. This blood test measures insulin secretion after you have a certain type of sugary (glucose) drink. ?Gastric emptying test. This test involves eating a bland meal that includes a material that shows up easily on an X-ray. After eating the meal, you have X-rays to find out how long it takes the stomach to empty. ?Upper endoscopy. In this exam, a health care provider puts a flexible telescope (endoscope) through your mouth and throat to check the inside of your stomach. ?How is this treated? ?For most people, this condition can be managed with diet changes. You may need to change your diet, nutrition, and eating habits. Working with a dietitian can be helpful. Other treatments may include: ?Medicine that slows down stomach emptying and reduces insulin secretion. This medicine may be given by injection. ?Medicine that slows down the digestion of sugar. This medicine is usually used to treat type 2 diabetes. Sometimes it can also relieve symptoms of late dumping syndrome. ?Surgery. For severe cases of dumping syndrome, reconstructive stomach surgery may be needed if other treatments are not working. ?Follow these instructions at home: ?Eating strategies ?Avoid drinking liquids for 30 minutes before meals. ?Avoid drinking liquids for at least 30 minutes after meals. ?Lie down for 15-30 minutes after meals. This will help to prevent light-headedness and will slow down the emptying of your stomach. ?Eat smaller portions of food. Cut food into small pieces and chew thoroughly before you swallow. ?Work with a Microbiologist to change your diet and maintain good nutrition. ?What to eat and drink ? ?Try to make sure that at least half of your daily servings of grains come from whole grains. These foods are high in fiber, which helps to slow down digestion. ?Eat more complex  carbohydrates, such as whole grains, vegetables, and beans. ?Avoid eating simple carbohydrates and foods that have added sugar, such as fruit juices, white bread, pastries, and sweets. Foods that are high in sugar are especially likely to bring about symptoms. ?With each meal and snack, eat one food that is high in protein. ?Avoid dairy products if they aggravate your symptoms. Try lactose-free dairy products instead. ?Avoid drinking alcohol. ?Take a fiber supplement if recommended by your health care provider. ?Increase the thickness of foods by adding plant-based thickening agents, such as pectin. ?Avoid eating very hot or very cold foods. They may aggravate your symptoms. ?General instructions ?Take over-the-counter and prescription medicines only as told by your health care provider. ?Keep all follow-up visits as told by your health care provider. This is important. ?Contact a health care provider if you: ?Notice that your symptoms change or get worse. ?Find that diet changes do not help your symptoms. ?Have trouble maintaining a healthy weight. ?Get help right away if you: ?Become dehydrated. ?Have severe dizziness. ?Notice that your heartbeat becomes very irregular. ?Summary ?Dumping syndrome is a combination of symptoms that occur when food passes through the stomach too quickly. ?Symptoms may include nausea, vomiting, diarrhea, dizziness, weakness, or hunger. ?This condition is often treated with dietary changes and medicines. ?Work with a Microbiologist to change your diet and maintain good nutrition. ?This information is not intended to replace advice given to you by your health care provider. Make sure you discuss any questions you have with your health care provider. ?Document Revised: 03/16/2020 Document Reviewed: 03/16/2020 ?Elsevier  Patient Education ? 2022 [place]. ? ?  ? ?  ?

## 2021-07-17 NOTE — Chronic Care Management (AMB) (Signed)
? Care Management ?  ? RN Visit Note ? ?07/17/2021 ?Name: Sandra [name] MRN: [id number] DOB: [birth year] ? ?Subjective: ?Sandra [name] is a 61 y.o. year old female who is a primary care patient of Burns, Claudina Lick, MD. The care management team was consulted for assistance with disease management and care coordination needs.   ? ?Engaged with patient by telephone for follow up visit in response to provider referral for case management and/or care coordination services.  ? ?Consent to Services:  ? Sandra [name] was given information about Care Management services 03/21/21 including:  ?Care Management services includes personalized support from designated clinical staff supervised by her physician, including individualized plan of care and coordination with other care providers ?24/7 contact phone numbers for assistance for urgent and routine care needs. ?The patient may stop case management services at any time by phone call to the office staff. ? ?Patient agreed to services and consent obtained.  ? ?Assessment: Review of patient past medical history, allergies, medications, health status, including review of consultants reports, laboratory and other test data, was performed as part of comprehensive evaluation and provision of chronic care management services.  ?Care Plan ? ?Allergies  ?Allergen Reactions  ? Nsaids   ?  History ulcers.  H/o gastric bypass  ? Statins   ?  Muscle aches  ? ?Outpatient Encounter Medications as of 07/17/2021  ?Medication Sig  ? acetaminophen (TYLENOL) 500 MG tablet Take 500 mg by mouth every 6 (six) hours as needed for moderate pain.  ? Blood Glucose Monitoring Suppl (FREESTYLE LITE) w/Device KIT Use up to 4 times daily to check blood glucose daily as directed.  ? COLLAGEN PO Take 1 Scoop by mouth daily.  ? ferrous sulfate 325 (65 FE) MG EC tablet Take 1 tablet by mouth daily with breakfast  ? gabapentin (NEURONTIN) 300 MG capsule Take 1 capsule by mouth 2 times daily.  ? glucose blood  (FREESTYLE LITE) test strip Use daily as instructed.  ? glucose blood (FREESTYLE LITE) test strip Use up to 4 times daily to check blood glucose as directed.  ? hydrocortisone (ANUSOL-HC) 25 MG suppository Insert rectally 2 times a day as needed  ? Lancets (FREESTYLE) lancets Use 1 lancet up to 4 times daily to check blood glucose as directed.  ? Multiple Vitamins-Minerals (MULTIVITAMIN PO) Take 2 tablets by mouth daily.   ? omeprazole (PRILOSEC) 40 MG capsule Take 1 capsule by mouth 2 times daily before a meal. Open capsule and sprinkle in water, other liquid or applesauce (Patient taking differently: Take 40 mg by mouth daily as needed (acid reflux).)  ? ondansetron (ZOFRAN-ODT) 4 MG disintegrating tablet Dissolve 1 - 2 tablets by mouth every 8 hours as needed for nausea or vomiting.  ? oxyCODONE (OXY IR/ROXICODONE) 5 MG immediate release tablet Take 1 - 2 tablets by mouth every 4 hours as needed for moderate pain or severe pain.  ? oxyCODONE (OXY IR/ROXICODONE) 5 MG immediate release tablet Take 1 tablet (5 mg total) by mouth every 6 (six) hours  ? polyethylene glycol powder (MIRALAX) 17 GM/SCOOP powder Take 17 g by mouth daily.  ? Sodium Chloride Flush (NORMAL SALINE FLUSH) 0.9 % SOLN Flush drain once daily as directed  ? ?No facility-administered encounter medications on file as of 07/17/2021.  ? ?Patient Active Problem List  ? Diagnosis Date Noted  ? Tear of LCL (lateral collateral ligament) of knee, left, initial encounter 07/11/2021  ? Hypophosphatemia 06/03/2021  ? Perforation of gallbladder in  cholecystitis s/p subtotal cholecystectomy 05/31/2021 05/31/2021  ? Gallbladder abscess 05/02/2021  ? Abscess 04/06/2021  ? Intra-abdominal abscess ([place]) 03/06/2021  ? Normocytic anemia 03/06/2021  ? Hypokalemia 03/06/2021  ? Hyponatremia 03/06/2021  ? Thrombocytosis 03/06/2021  ? Moderate protein-calorie malnutrition ([place]) 03/06/2021  ? Abdominal pain 02/17/2021  ? Right carpal tunnel syndrome 11/10/2020  ? Labral tear  of right hip joint 11/10/2020  ? Chronic right hip pain 04/10/2020  ? Nonallopathic lesion of sacral region 08/05/2019  ? Greater trochanteric bursitis, right 08/05/2019  ? Benign neoplasm of descending colon   ? Low back pain 05/27/2019  ? Hematoma of right hip 05/07/2019  ? Trigger finger, right index finger 02/17/2019  ? Anxiety 11/12/2018  ? Left lateral epicondylitis 05/14/2018  ? Left elbow pain 05/04/2018  ? Right rotator cuff tear 08/20/2017  ? Cervical disc disorder with radiculopathy of cervical region 04/25/2016  ? Nonallopathic lesion of cervical region 04/17/2016  ? Nonallopathic lesion of thoracic region 04/17/2016  ? Nonallopathic lesion of rib cage 04/17/2016  ? Nonallopathic lesion of lumbosacral region 04/17/2016  ? Degenerative cervical disc 03/29/2016  ? Degenerative arthritis of right knee 03/29/2016  ? 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 Adjustment disorder with mixed anxiety and depressed mood 07/14/2014  ? Personal history of colonic polyps   ? 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 Chronic constipation 03/05/2013  ? Coronary atherosclerosis of native coronary artery 08/27/2011  ? History of uterine cancer 08/15/2011  ? Type 2 diabetes mellitus ([place]) 08/15/2011  ? Hypercholesterolemia 08/15/2011  ? ?Conditions to be addressed/monitored: DMII and recent surgery ? ?Care Plan : RN Care Manager Plan of Care  ?Updates made by Knox Royalty, RN since 07/17/2021 12:00 AM  ?  ? ?Problem: Chronic Disease Management Needs   ?Priority: High  ?  ? ?Long-Range Goal: Ongoing adherence to established plan of care for long term chronic disease management   ?Start Date: 03/30/2021  ?Expected End Date: 03/30/2022  ?Priority: High  ?Note:   ?Current Barriers:  ?Chronic Disease Management support and education needs related to HLD and DMII, recent surgery ?Recent hospitalization November 8-11, 2022 for intraabdominal abscess secondary to perforated gallbladder ?Planned hospitalization February  3-7, 2023 for lap chole ? ?RNCM Clinical Goal(s):  ?Patient will demonstrate ongoing health management independence as evidenced by adherence to established plan of care for DMII/ recent surgery        through collaboration with RN Care manager, provider, and care team.  ? ?Interventions: ?1:1 collaboration with primary care provider regarding development and update of comprehensive plan of care as evidenced by provider attestation and co-signature ?Inter-disciplinary care team collaboration (see longitudinal plan of care) ?Evaluation of current treatment plan related to  self management and patient's adherence to plan as established by provider ?07/17/21: ?Review of patient status, including review of consultants reports, relevant laboratory and other test results, and medications completed ?SDOH updated: no new/ unmet concerns identified ?Pain assessment updated: today, she denies acute/ post-surgical/ chronic pain-- feels she "has turned the corner" after her recent surgery ?Falls assessment updated: she denies new/ recent falls since last fall reported in early February after her hospital discharge; not currently using assistive devices; positive reinforcement provided with encouragement to continue efforts at fall prevention; previously provided education around fall risks/ prevention reinforced ?Medications discussed:  reports continues to independently self-manage and denies current concerns/ issues/ questions around medications; endorses adherence to taking all medications as prescribed ?Discussed current clinical condition since last outreach- states she  is doing "much better" and "feeling like normal" "more and more" every day ?Confirms her previously reported cough on 07/03/21 has now resolved; reports resolved with use of OTC medications ?Reviewed recent surgical provider post-op office visit 07/04/21: she verbalizes good understanding of general post-office visit instructions and denies questions ?Confirms  she has been released to return to work on 07/26/21- she is feeling uncertain about this, 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

## 2021-07-19 ENCOUNTER — Other Ambulatory Visit (HOSPITAL_COMMUNITY): Payer: Self-pay

## 2021-07-19 ENCOUNTER — Other Ambulatory Visit: Payer: Self-pay | Admitting: Internal Medicine

## 2021-07-19 MED ORDER — ONDANSETRON 4 MG PO TBDP
ORAL_TABLET | ORAL | 3 refills | Status: DC
  Filled 2021-07-19: qty 30, 5d supply, fill #0
  Filled 2021-09-13: qty 30, 5d supply, fill #1
  Filled 2022-01-21: qty 30, 5d supply, fill #2
  Filled 2022-04-23: qty 30, 5d supply, fill #3

## 2021-08-14 ENCOUNTER — Ambulatory Visit: Payer: 59 | Admitting: Family Medicine

## 2021-08-14 NOTE — Progress Notes (Signed)
?Charlann Boxer D.O. ?[facility] ?[place] ?Phone: [phone] ?Subjective:   ?I, Sandra [name], am serving as a scribe for Dr. Hulan Saas. ? ?This visit occurred during the SARS-CoV-2 public health emergency.  Safety protocols were in place, including screening questions prior to the visit, additional usage of staff PPE, and extensive cleaning of exam room while observing appropriate contact time as indicated for disinfecting solutions.  ? ? ?I'm seeing this patient by the request  of:  [name], MD ? ?CC: Neck and back pain follow-up ? ?[id number]  ?07/11/2021 ?Partial noted.  Discussed with patient about bracing and given a hinged brace.  Would like to avoid doing any true immobilizer secondary to patient already has been deconditioning from recent hospitalization.  Patient is going to continue with some home exercises and icing regimen.  Follow-up again in 4 to 6 weeks to make sure patient has been appropriately.  X-ray pending for any other occult fracture. ? ?Update 08/15/2021 ?Brandis Yetzali Weld is a 61 y.o. female coming in with complaint of back and neck pain. OMT 07/11/2021. Also f/u for L knee pain. Patient states that she has had little relief in pain in SI joint. Tylenol prn. Denies any radiating symptoms. Knee pain has improved. Brace helps with stability and pain reduction.  ? ?Also notes that she needs R hand pointer finger popped.  ? ?Medications patient has been prescribed:  ? ?Taking: ? ? ?  ? ? ? ? ?Reviewed prior external information including notes and imaging from previsou exam, outside providers and external EMR if available.  ? ?As well as notes that were available from care everywhere and other healthcare systems. ? ?Past medical history, social, surgical and family history all reviewed in electronic medical record.  No pertanent information unless stated regarding to the chief complaint.  ? ?Past Medical History:  ?Diagnosis Date  ?  Anxiety   ? Arthritis   ? CAD (coronary artery disease)   ? nonobstructive CAD in LAD per 2013 CTA coronary morph  ? Cholelithiasis 04/06/2021  ? Depression   ? Diabetes mellitus   ? no meds taken now, checks cbg once or twice a week   ? Difficult intubation   ? had to use the glidescope due to small airway   ? Gallbladder abscess 05/02/2021  ? 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 marginal ulcer  ? GERD (gastroesophageal reflux disease)   ? H/O gastric bypass   ? Heart murmur   ? with last visit, MD states "outgrown" murmur  ? Hemorrhoids, internal, with bleeding and Grade 2 prolapse 03/05/2013  ? All positions seen an anoscopy RP columns banded 03/05/2013 LL and RA banded 03/18/2013     ? History of endometrial cancer 2006 ---- S/P ABD. HYSTERECTOMY  ? STAGE I ENDOMETROID CARCINOMA  ? History of kidney stones   ? Hx of adenomatous colonic polyps 2011  ? BENIGN  ? Hyperlipidemia   ? no meds now  ? Hypertension   ? Pneumonia   ? Sleep apnea   ? no CPAP since surgery  ?  ?Allergies  ?Allergen Reactions  ? Nsaids   ?  History ulcers.  H/o gastric bypass  ? Statins   ?  Muscle aches  ? ? ? ?Review of Systems: ? No headache, visual changes, nausea, vomiting, diarrhea, constipation, dizziness, abdominal pain, skin rash, fevers, chills, night sweats, weight loss, swollen lymph nodes, body aches, joint swelling, chest pain, shortness of breath, mood  changes. POSITIVE muscle aches ? ?Objective  ?Blood pressure 120/82, pulse 72, height '5\' 2"'$  (1.575 m), weight 135 lb (61.2 kg), SpO2 98 %. ?  ?General: No apparent distress alert and oriented x3 mood and affect normal, dressed appropriately.  ?HEENT: Pupils equal, extraocular movements intact  ?Respiratory: Patient's speak in full sentences and does not appear short of breath  ?Cardiovascular: No lower extremity edema, non tender, no erythema  ?Neck exam did have some loss of patient does have tightness noted in the region as well.  The thoracolumbar juncture exam into the  sacroiliac joint.  Tightness with straight leg test bilaterally as well. ? ?Osteopathic findings ? ?C2 flexed rotated and side bent right ?C5 flexed rotated and side bent left ?T3 extended rotated and side bent right inhaled rib ?T9 extended rotated and side bent left ?L2 flexed rotated and side bent right ?Sacrum right on right ? ? ? ? ?  ?Assessment and Plan: ? ?Low back pain ?Chronic problem with exacerbation, patient did likely have some deconditioning secondary to patient not being quite as active with patient's hospitalization.  Patient is feeling better overall but still has some strength to increase.  We discussed with patient icing regimen and home exercise, which activities to do and which ones to avoid, increase activity slowly.  Follow-up again in 6 to 8 weeks otherwise.  Medications we discussed included the gabapentin  ? ?Nonallopathic problems ? ?Decision today to treat with OMT was based on Physical Exam ? ?After verbal consent patient was treated with HVLA, ME, FPR techniques in cervical, rib, thoracic, lumbar, and sacral  areas ? ?Patient tolerated the procedure well with improvement in symptoms ? ?Patient given exercises, stretches and lifestyle modifications ? ?See medications in patient instructions if given ? ?Patient will follow up in 4-8 weeks ? ?  ? ? ?The above documentation has been reviewed and is accurate and complete Lyndal Pulley, DO ? ? ? ?  ? ? Note: This dictation was prepared with Dragon dictation along with smaller phrase technology. Any transcriptional errors that result from this process are unintentional.    ?  ?  ? ?

## 2021-08-15 ENCOUNTER — Ambulatory Visit: Payer: 59 | Admitting: Family Medicine

## 2021-08-15 ENCOUNTER — Ambulatory Visit (INDEPENDENT_AMBULATORY_CARE_PROVIDER_SITE_OTHER): Payer: 59

## 2021-08-15 ENCOUNTER — Encounter: Payer: Self-pay | Admitting: Family Medicine

## 2021-08-15 VITALS — BP 120/82 | HR 72 | Ht 62.0 in | Wt 135.0 lb

## 2021-08-15 DIAGNOSIS — G8929 Other chronic pain: Secondary | ICD-10-CM

## 2021-08-15 DIAGNOSIS — M9901 Segmental and somatic dysfunction of cervical region: Secondary | ICD-10-CM

## 2021-08-15 DIAGNOSIS — M9904 Segmental and somatic dysfunction of sacral region: Secondary | ICD-10-CM | POA: Diagnosis not present

## 2021-08-15 DIAGNOSIS — M9903 Segmental and somatic dysfunction of lumbar region: Secondary | ICD-10-CM

## 2021-08-15 DIAGNOSIS — M9902 Segmental and somatic dysfunction of thoracic region: Secondary | ICD-10-CM

## 2021-08-15 DIAGNOSIS — M9908 Segmental and somatic dysfunction of rib cage: Secondary | ICD-10-CM

## 2021-08-15 DIAGNOSIS — M545 Low back pain, unspecified: Secondary | ICD-10-CM

## 2021-08-15 NOTE — Assessment & Plan Note (Signed)
Chronic problem with exacerbation, patient did likely have some deconditioning secondary to patient not being quite as active with patient's hospitalization.  Patient is feeling better overall but still has some strength to increase.  We discussed with patient icing regimen and home exercise, which activities to do and which ones to avoid, increase activity slowly.  Follow-up again in 6 to 8 weeks otherwise.  Medications we discussed included the gabapentin ?

## 2021-08-15 NOTE — Patient Instructions (Addendum)
Xray today ?Keep increasing activity as you can ?Starting in May wear brace less and less at work  ?See me in 6-8 weeks ? ? ?

## 2021-09-13 ENCOUNTER — Other Ambulatory Visit (HOSPITAL_COMMUNITY): Payer: Self-pay

## 2021-09-25 NOTE — Progress Notes (Unsigned)
[facility] [place] Phone: [phone] Subjective:   Sandra [name], am serving as a scribe for Dr. Hulan Saas.   I'm seeing this patient by the request  of:  [name], MD  CC: neck and back pain    [id number]  Sandra [name] is a 61 y.o. female coming in with complaint of back and neck pain. OMT 08/15/2021. Patient states that she is sore in the spine but nothing new. Pain in R hand CMC and 2nd finger MCP joint worse with working. Would like injection.   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 HYSTERECTOMY   STAGE I ENDOMETROID CARCINOMA   History of kidney stones    Hx of adenomatous colonic polyps 2011   BENIGN   Hyperlipidemia    [name] meds now   Hypertension    Pneumonia    Sleep apnea    [name] CPAP since surgery    Allergies  Allergen Reactions   Nsaids     History ulcers.  H/o gastric bypass   Statins     Muscle aches     Review of Systems:  [name] headache, visual changes, nausea, vomiting, diarrhea, constipation, dizziness, abdominal pain, skin rash, fevers, chills, night sweats, weight loss, swollen  lymph nodes, body aches, joint swelling, chest pain, shortness of breath, mood changes. POSITIVE muscle aches  Objective  Blood pressure 126/82, pulse 70, height '5\' 2"'$  (1.575 m), weight 134 lb (60.8 kg), SpO2 97 %.   General: [name] apparent distress alert and oriented x3 mood and affect normal, dressed appropriately.  HEENT: Pupils equal, extraocular movements intact  Respiratory: Patient's speak in full sentences and does not appear short of breath  Cardiovascular: [name] lower extremity edema, non tender, [name] erythema  Patient's neck exam still has a loss of lordosis.  Some limited sidebending bilaterally.  Some tenderness to palpation in the paraspinal musculature.  Patient's right hand does have what appears to be more of a trigger nodule noted.  This seems to be over the index finger patient does have swelling noted.  Osteopathic findings  C2 flexed rotated and side bent right C6 flexed rotated and side bent left T3 extended rotated and side bent right inhaled rib T7 extended rotated and side bent left L3 flexed rotated and side bent left  Sacrum right on right  Procedure: Real-time Ultrasound Guided Injection of second MCP right side Device: GE Logiq Q7 Ultrasound guided injection is preferred based studies that show increased duration,  increased effect, greater accuracy, decreased procedural pain, increased response rate, and decreased cost with ultrasound guided versus blind injection.  Verbal informed consent obtained.  Time-out conducted.  Noted [name] overlying erythema, induration, or other signs of local infection.  Skin prepped in a sterile fashion.  Local anesthesia: Topical Ethyl chloride.  With sterile technique and under real time ultrasound guidance: With a 25-gauge half inch needle injected with 0.5 cc of 0.5% Marcaine and 0.5 cc of Kenalog 40 mg/mL Completed without difficulty  Pain immediately resolved suggesting accurate placement of the medication.  Advised to call if  fevers/chills, erythema, induration, drainage, or persistent bleeding.  Impression: Technically successful ultrasound guided injection.     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  Discussed which activities to do and which ones to avoid.  Increase activity slowly.  Follow-up again in 6 to 8 weeks    Nonallopathic problems  Decision today to treat with OMT was based on Physical Exam  After verbal consent patient was treated with HVLA, ME, FPR techniques in cervical, rib, thoracic, lumbar, and sacral  areas  Patient tolerated the procedure well with improvement in symptoms  Patient given exercises, stretches and lifestyle modifications  See medications in patient instructions if given  Patient will follow up in 4-8 weeks      The above documentation has been reviewed and is accurate and complete Lyndal Pulley, DO        Note: This dictation was prepared with Dragon dictation along with smaller phrase technology. Any transcriptional errors that result from this process are unintentional.

## 2021-09-26 ENCOUNTER — Ambulatory Visit: Payer: 59 | Admitting: Family Medicine

## 2021-09-26 ENCOUNTER — Encounter: Payer: Self-pay | Admitting: Family Medicine

## 2021-09-26 ENCOUNTER — Ambulatory Visit: Payer: Self-pay

## 2021-09-26 VITALS — BP 126/82 | HR 70 | Ht 62.0 in | Wt 134.0 lb

## 2021-09-26 DIAGNOSIS — M9904 Segmental and somatic dysfunction of sacral region: Secondary | ICD-10-CM | POA: Diagnosis not present

## 2021-09-26 DIAGNOSIS — M9908 Segmental and somatic dysfunction of rib cage: Secondary | ICD-10-CM | POA: Diagnosis not present

## 2021-09-26 DIAGNOSIS — M9903 Segmental and somatic dysfunction of lumbar region: Secondary | ICD-10-CM

## 2021-09-26 DIAGNOSIS — M9901 Segmental and somatic dysfunction of cervical region: Secondary | ICD-10-CM | POA: Diagnosis not present

## 2021-09-26 DIAGNOSIS — M79641 Pain in right hand: Secondary | ICD-10-CM | POA: Diagnosis not present

## 2021-09-26 DIAGNOSIS — M9902 Segmental and somatic dysfunction of thoracic region: Secondary | ICD-10-CM | POA: Diagnosis not present

## 2021-09-26 DIAGNOSIS — M19041 Primary osteoarthritis, right hand: Secondary | ICD-10-CM | POA: Diagnosis not present

## 2021-09-26 NOTE — Assessment & Plan Note (Signed)
Injection given today and tolerated the procedure well, discussed icing regimen and home exercises.  Discussed which activities to do and which ones to avoid.  Increase activity slowly.  Follow-up again in 6 to 8 weeks

## 2021-09-26 NOTE — Patient Instructions (Addendum)
Injection in finger today See me in 8 weeks

## 2021-10-19 ENCOUNTER — Other Ambulatory Visit (HOSPITAL_COMMUNITY): Payer: Self-pay

## 2021-10-31 ENCOUNTER — Ambulatory Visit: Payer: 59 | Admitting: *Deleted

## 2021-10-31 ENCOUNTER — Other Ambulatory Visit (HOSPITAL_COMMUNITY): Payer: Self-pay

## 2021-10-31 ENCOUNTER — Other Ambulatory Visit: Payer: Self-pay | Admitting: Family Medicine

## 2021-10-31 DIAGNOSIS — K651 Peritoneal abscess: Secondary | ICD-10-CM

## 2021-10-31 DIAGNOSIS — E7800 Pure hypercholesterolemia, unspecified: Secondary | ICD-10-CM

## 2021-10-31 DIAGNOSIS — E119 Type 2 diabetes mellitus without complications: Secondary | ICD-10-CM

## 2021-10-31 MED ORDER — GABAPENTIN 300 MG PO CAPS
300.0000 mg | ORAL_CAPSULE | Freq: Two times a day (BID) | ORAL | 0 refills | Status: DC
  Filled 2021-10-31: qty 180, 90d supply, fill #0

## 2021-10-31 NOTE — Chronic Care Management (AMB) (Signed)
Care Management    RN Visit Note  10/31/2021 Name: Sandra [name] MRN: [id number] DOB: [birth year]  Subjective: Sandra [name] is a 61 y.o. year old female who is a primary care patient of Burns, Sandra Lick, MD. The care management team was consulted for assistance with disease management and care coordination needs.    Engaged with patient by telephone for follow up visit/ Clinic RN CM case closure in response to provider referral for case management and/or care coordination services.   Consent to Services:   [name] was given information about Care Management services 03/21/21 including:  Care Management services includes personalized support from designated clinical staff supervised by her physician, including individualized plan of care and coordination with other care providers 24/7 contact phone numbers for assistance for urgent and routine care needs. The patient may stop case management services at any time by phone call to the office staff.  Patient agreed to services and consent obtained.   Assessment: Review of patient past medical history, allergies, medications, health status, including review of consultants reports, laboratory and other test data, was performed as part of comprehensive evaluation and provision of chronic care management services.   SDOH (Social Determinants of Health) assessments and interventions performed:  SDOH Interventions    Flowsheet Row Most Recent Value  SDOH Interventions   Food Insecurity Interventions Intervention Not Indicated  [continues to deny food insecurity]  Transportation Interventions Intervention Not Indicated  [patient has resumed driving self]     Care Plan  Allergies  Allergen Reactions   Nsaids     History ulcers.  H/o gastric bypass   Statins     Muscle aches   Outpatient Encounter Medications as of 10/31/2021  Medication Sig   acetaminophen (TYLENOL) 500 MG tablet Take 500 mg by mouth every 6 (six) hours as  needed for moderate pain.   Blood Glucose Monitoring Suppl (FREESTYLE LITE) w/Device KIT Use up to 4 times daily to check blood glucose daily as directed.   COLLAGEN PO Take 1 Scoop by mouth daily.   ferrous sulfate 325 (65 FE) MG EC tablet Take 1 tablet by mouth daily with breakfast   gabapentin (NEURONTIN) 300 MG capsule Take 1 capsule by mouth 2 times daily.   glucose blood (FREESTYLE LITE) test strip Use daily as instructed.   glucose blood (FREESTYLE LITE) test strip Use up to 4 times daily to check blood glucose as directed.   hydrocortisone (ANUSOL-HC) 25 MG suppository Insert rectally 2 times a day as needed   Lancets (FREESTYLE) lancets Use 1 lancet up to 4 times daily to check blood glucose as directed.   Multiple Vitamins-Minerals (MULTIVITAMIN PO) Take 2 tablets by mouth daily.    omeprazole (PRILOSEC) 40 MG capsule Take 1 capsule by mouth 2 times daily before a meal. Open capsule and sprinkle in water, other liquid or applesauce (Patient taking differently: Take 40 mg by mouth daily as needed (acid reflux).)   ondansetron (ZOFRAN-ODT) 4 MG disintegrating tablet Dissolve 1 - 2 tablets by mouth every 8 hours as needed for nausea or vomiting.   ondansetron (ZOFRAN-ODT) 4 MG disintegrating tablet DISSOLVE 1 TABLET BY MOUTH EVERY 8 HOURS AS NEEDED FOR NAUSEA OR VOMITING (MAY TAKE 2 TABLETS IF 1 TABLET IS INEFFECTIVE)   oxyCODONE (OXY IR/ROXICODONE) 5 MG immediate release tablet Take 1 - 2 tablets by mouth every 4 hours as needed for moderate pain or severe pain.   oxyCODONE (OXY IR/ROXICODONE) 5 MG immediate release tablet  Take 1 tablet (5 mg total) by mouth every 6 (six) hours   polyethylene glycol powder (MIRALAX) 17 GM/SCOOP powder Take 17 g by mouth daily.   Sodium Chloride Flush (NORMAL SALINE FLUSH) 0.9 % SOLN Flush drain once daily as directed   No facility-administered encounter medications on file as of 10/31/2021.   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 09/26/2021   Tear of LCL (lateral collateral ligament) of knee, left, initial encounter 07/11/2021   Hypophosphatemia 06/03/2021   Perforation of gallbladder in cholecystitis s/p subtotal cholecystectomy 05/31/2021 05/31/2021   Gallbladder abscess 05/02/2021   Abscess 04/06/2021   Intra-abdominal abscess ([place]) 03/06/2021   Normocytic anemia 03/06/2021   Hypokalemia 03/06/2021   Hyponatremia 03/06/2021   Thrombocytosis 03/06/2021   Moderate protein-calorie malnutrition ([place]) 03/06/2021   Abdominal pain 02/17/2021   Right carpal tunnel syndrome 11/10/2020   Labral tear of right hip joint 11/10/2020   Chronic right hip pain 04/10/2020   Nonallopathic lesion of sacral region 08/05/2019   Greater trochanteric bursitis, right 08/05/2019   Benign neoplasm of descending colon    Low back pain 05/27/2019   Hematoma of right hip 05/07/2019   Trigger finger, right index finger 02/17/2019   Anxiety 11/12/2018   Left lateral epicondylitis 05/14/2018   Left elbow pain 05/04/2018   Right rotator cuff tear 08/20/2017   Cervical disc disorder with radiculopathy of cervical region 04/25/2016   Nonallopathic lesion of cervical region 04/17/2016   Nonallopathic lesion of thoracic region 04/17/2016   Nonallopathic lesion of rib cage 04/17/2016   Nonallopathic lesion of lumbosacral region 04/17/2016   Degenerative cervical disc 03/29/2016   Degenerative arthritis of right knee 03/29/2016   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   Adjustment disorder with mixed anxiety and depressed mood 07/14/2014   Personal history of colonic polyps    History of Roux-en-Y gastric bypass, 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   Chronic constipation 03/05/2013   Coronary atherosclerosis of native coronary artery 08/27/2011   History of uterine cancer 08/15/2011   Type 2 diabetes mellitus ([place]) 08/15/2011   Hypercholesterolemia 08/15/2011   Conditions to be  addressed/monitored: history of intraabdominal abscess; HLD and pre- DMII  Care Plan : RN Care Manager Plan of Care  Updates made by Knox Royalty, RN since 10/31/2021 12:00 AM     Problem: Chronic Disease Management Needs   Priority: High     Long-Range Goal: Ongoing adherence to established plan of care for long term chronic disease management   Start Date: 03/30/2021  Expected End Date: 03/30/2022  Priority: High  Note:   Current Barriers:  Chronic Disease Management support and education needs related to HLD and DMII, recent surgery Recent hospitalization November 8-11, 2022 for intraabdominal abscess secondary to perforated gallbladder Planned hospitalization February 3-7, 2023 for lap chole  RNCM Clinical Goal(s):  Patient will demonstrate ongoing health management independence as evidenced by adherence to established plan of care for DMII/ recent surgery        through collaboration with RN Care manager, provider, and care team.   Interventions: 1:1 collaboration with primary care provider regarding development and update of comprehensive plan of care as evidenced by provider attestation and co-signature Inter-disciplinary care team collaboration (see longitudinal plan of care) Evaluation of current treatment plan related to  self management and patient's adherence to plan as established by provider Review of patient status, including review of consultants reports, relevant laboratory and other test results, and  medications completed SDOH updated: no new/ unmet concerns identified Pain assessment updated: reports ongoing chronic pain of neck/ back, "especially on days I work;" denies pain today; confirms she continues being established/ closely followed by sports medicine provider Falls assessment updated: continues to deny new/ recent falls x 4-5 months; not currently using assistive devices;  positive reinforcement provided with encouragement to continue efforts at fall  prevention; previously provided education around fall risks/ prevention reinforced Medications discussed: reports continues to independently self-manage and denies current concerns/ issues/ questions around medications; endorses adherence to taking all medications as prescribed Reviewed upcoming scheduled provider appointments: 12/04/21- sports medicine provider; patient confirms is aware of all and has plans to attend as scheduled Discussed plans with patient for ongoing care management follow up- patient denies current care coordination/ care management needs and is agreeable to CCM RN CM case closure today; verbalizes understanding to contact PCP or other care providers for any needs that arise in the future, and confirms she has contact information for all care providers        Diabetes:  (Status: 10/31/21: Goal Met.) Long Term Goal   Lab Results  Component Value Date   HGBA1C 5.8 (H) 05/18/2021  Reviewed prescribed diet with patient low carbohydrate, low sugar, heart healthy, low cholesterol; Confirmed no recent signs/ symptoms low blood sugars; reinforced previously provided education around  signs/ symptoms low blood sugars along with corresponding action plan Confirmed not taking medications for DMII-- continues to be diet controlled Confirmed appetite "better; post-op" states able to eat more solid food-- reports she has been released from surgical provider follow up and understands call for new problems; reports appetite "comes and goes" and endorses following prescribed diet  Recent surgery for intra-abdominal abscess  (Status: 10/31/21: Goal Met.) Long Term Goal  Evaluation of current treatment plan related to  surgical follow up plan ,  self-management and patient's adherence to plan as established by provider. Discussed plan for ongoing surgical follow up: she reports she sees her GI provider regularly at her work and understands to immediately contact provider for any new problems that  arise- reports "I am back to normal and doing well, seem to be fully recovered;" tolerating being back at work "good;" and reports able to be "active within my own limitations;" has been going to pool and is able to [employer], etc    Plan:  No further follow up required: patient denies current care coordination/ care management needs and is agreeable to CCM RN CM case closure today; Clinic RN CM case closure accordingly     Oneta Rack, RN, BSN, [place] [phone]: direct office

## 2021-11-01 ENCOUNTER — Other Ambulatory Visit (HOSPITAL_COMMUNITY): Payer: Self-pay

## 2021-11-05 ENCOUNTER — Telehealth: Payer: Self-pay | Admitting: Internal Medicine

## 2021-11-05 NOTE — Telephone Encounter (Signed)
Patient discussed right upper quadrant pain with me today while we were at work She had been feeling well but in the last several days has noticed intermittent right upper quadrant pain initially mild but several times over the weekend moderate. Again intermittent, no nausea or vomiting.  No fever  History of subtotal cholecystectomy complicated by bile leak and intraoperative abscess/fluid collections  She had a CT scan performed on 07/10/2021 which showed resolution of right upper quadrant drainage catheter as well as resolution of right upper quadrant fluid collections.  At that time there was wall thickening in the distal gastric wall and stranding in the infrahepatic region between the liver and hepatic flexure.  With her history I recommended she discuss this with Dr. Carlean Purl and also that we perform a right upper quadrant ultrasound to start  Remo Lipps, in Dr. Celesta Aver absence today please order right upper quadrant ultrasound.

## 2021-11-06 ENCOUNTER — Other Ambulatory Visit: Payer: Self-pay

## 2021-11-06 DIAGNOSIS — R1010 Upper abdominal pain, unspecified: Secondary | ICD-10-CM

## 2021-11-06 NOTE — Telephone Encounter (Addendum)
RUQ ultrasound ordered and scheduled for 11/15/2021 at [facility] at 8:00 AM. Eartha Inch at 7:30 :  Nothing to eat or drink past midnight: Pt made aware Pt verbalized understanding with all questions answered.

## 2021-11-15 ENCOUNTER — Ambulatory Visit (HOSPITAL_COMMUNITY)
Admission: RE | Admit: 2021-11-15 | Discharge: 2021-11-15 | Disposition: A | Discharge status: Home / Self Care | Payer: 59 | Source: Ambulatory Visit | Attending: Internal Medicine | Admitting: Internal Medicine

## 2021-11-15 DIAGNOSIS — R1010 Upper abdominal pain, unspecified: Secondary | ICD-10-CM | POA: Insufficient documentation

## 2021-11-15 DIAGNOSIS — R1011 Right upper quadrant pain: Secondary | ICD-10-CM | POA: Diagnosis not present

## 2021-11-16 ENCOUNTER — Other Ambulatory Visit: Payer: Self-pay | Admitting: Internal Medicine

## 2021-11-21 ENCOUNTER — Ambulatory Visit: Payer: 59 | Admitting: Family Medicine

## 2021-11-29 NOTE — Progress Notes (Signed)
[facility] [place] [address] [place] Phone: [phone] Subjective:   [name], am serving as a scribe for Dr. Hulan Saas.  I'm seeing this patient by the request  of:  [name], MD  CC: Back and neck pain follow-up  FFM:[id number]  Jadon Shanese Riemenschneider is a 61 y.o. female coming in with complaint of back and neck pain. OMT 09/26/2021. Also f/u for R hand pain. Patient states everything is about the same. No new issues.  Medications patient has been prescribed: gabapentin  Taking:         Reviewed prior external information including notes and imaging from previsou exam, outside providers and external EMR if available.   As well as notes that were available from care everywhere and other healthcare systems.  Past medical history, social, surgical and family history all reviewed in electronic medical record.  No pertanent information unless stated regarding to the chief complaint.   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 HYSTERECTOMY   STAGE I ENDOMETROID CARCINOMA   History of kidney stones    Hx of adenomatous colonic polyps 2011   BENIGN   Hyperlipidemia    no meds now   Hypertension     Pneumonia    Sleep apnea    no CPAP since surgery    Allergies  Allergen Reactions   Nsaids     History ulcers.  H/o gastric bypass   Statins     Muscle aches     Review of Systems:  No headache, visual changes, nausea, vomiting, diarrhea, constipation, dizziness, abdominal pain, skin rash, fevers, chills, night sweats, weight loss, swollen lymph nodes, body aches, joint swelling, chest pain, shortness of breath, mood changes. POSITIVE muscle aches  Objective  Blood pressure 116/74, pulse 71, height 5' 2"  (1.575 m), weight 132 lb (59.9 kg), SpO2 95 %.   General: No apparent distress alert and oriented x3 mood and affect normal, dressed appropriately.  HEENT: Pupils equal, extraocular movements intact  Respiratory: Patient's speak in full sentences and does not appear short of breath  Cardiovascular: No lower extremity edema, non tender, no erythema  Gait MSK:  Back does have some loss of lordosis.  Some tenderness to palpation in the paraspinal musculature.  Patient also has tightness noted with FABER test.  Limited sidebending of the neck lacks last 10 degrees of extension  Osteopathic findings  C2 flexed rotated and side bent right C6 flexed rotated and side bent left T3 extended rotated and side bent right inhaled rib T7 extended rotated and side bent left L2 flexed rotated and  side bent right Sacrum right on right     Assessment and Plan:  Degenerative cervical disc Degenerative disc disease.  Discussed icing regimen and home exercise, which activities to do which ones to avoid.  Is responding well to manipulation.  Discussed the gabapentin again.  Continue this.  Could consider other medication such as Cymbalta if necessary in the long run.  Patient will consider it.  Follow-up again 6 to 8 weeks    Nonallopathic problems  Decision today to treat with OMT was based on Physical Exam  After verbal consent patient was treated with HVLA, ME, FPR techniques in  cervical, rib, thoracic, lumbar, and sacral  areas  Patient tolerated the procedure well with improvement in symptoms  Patient given exercises, stretches and lifestyle modifications  See medications in patient instructions if given  Patient will follow up in 6-8 weeks     The above documentation has been reviewed and is accurate and complete Lyndal Pulley, DO         Note: This dictation was prepared with Dragon dictation along with smaller phrase technology. Any transcriptional errors that result from this process are unintentional.

## 2021-12-02 ENCOUNTER — Encounter: Payer: Self-pay | Admitting: Internal Medicine

## 2021-12-02 NOTE — Progress Notes (Signed)
Subjective:    Patient ID: Sandra [name], female    DOB: 1961/01/28, 61 y.o.   MRN: [id number]      HPI Sandra [name] is here for a Physical exam.    Still lower energy since being ill with her GB and abdominal abscess.  She has gotten better and is slowly improving.  She has intermittent abdominal pain  - ? Scar tissue.     Medications and allergies reviewed with patient and updated if appropriate.  Current Outpatient Medications on File Prior to Visit  Medication Sig Dispense Refill   acetaminophen (TYLENOL) 500 MG tablet Take 500 mg by mouth every 6 (six) hours as needed for moderate pain.     Blood Glucose Monitoring Suppl (FREESTYLE LITE) w/Device KIT Use up to 4 times daily to check blood glucose daily as directed. 1 kit 0   COLLAGEN PO Take 1 Scoop by mouth daily.     gabapentin (NEURONTIN) 300 MG capsule Take 1 capsule by mouth 2 times daily. 180 capsule 0   glucose blood (FREESTYLE LITE) test strip Use daily as instructed. 100 each 12   glucose blood (FREESTYLE LITE) test strip Use up to 4 times daily to check blood glucose as directed. 100 each 0   hydrocortisone (ANUSOL-HC) 25 MG suppository Insert rectally 2 times a day as needed 12 suppository 1   Lancets (FREESTYLE) lancets Use 1 lancet up to 4 times daily to check blood glucose as directed. 100 each 0   Multiple Vitamins-Minerals (MULTIVITAMIN PO) Take 2 tablets by mouth daily.      omeprazole (PRILOSEC) 40 MG capsule Take 1 capsule (40 mg total) by mouth daily. 180 capsule 1   ondansetron (ZOFRAN-ODT) 4 MG disintegrating tablet DISSOLVE 1 TABLET BY MOUTH EVERY 8 HOURS AS NEEDED FOR NAUSEA OR VOMITING (MAY TAKE 2 TABLETS IF 1 TABLET IS INEFFECTIVE) 30 tablet 3   polyethylene glycol powder (MIRALAX) 17 GM/SCOOP powder Take 17 g by mouth daily. 238 g 0   Sodium Chloride Flush (NORMAL SALINE FLUSH) 0.9 % SOLN Flush drain once daily as directed 300 mL 1   No current facility-administered medications on file prior to  visit.    Review of Systems  Constitutional:  Negative for fever.  HENT:  Positive for postnasal drip.   Eyes:  Negative for visual disturbance.  Respiratory:  Negative for cough, shortness of breath and wheezing.   Cardiovascular:  Negative for chest pain, palpitations and leg swelling.  Gastrointestinal:  Positive for abdominal pain (occasional ruq), constipation and nausea (frequent). Negative for blood in stool and diarrhea.       Gerd controlled  Genitourinary:  Negative for dysuria.  Musculoskeletal:  Positive for arthralgias (r shoulder, r hip) and back pain (lower back).  Skin:  Negative for rash.  Neurological:  Positive for light-headedness (occ) and headaches (occ).  Psychiatric/Behavioral:  Negative for dysphoric mood. The patient is nervous/anxious.        Objective:   Vitals:   12/03/21 0905  BP: 126/70  Pulse: 80  Temp: 98 F (36.7 C)  SpO2: 96%   Filed Weights   12/03/21 0905  Weight: 132 lb (59.9 kg)   Body mass index is 24.14 kg/m.  BP Readings from Last 3 Encounters:  12/03/21 126/70  09/26/21 126/82  08/15/21 120/82    Wt Readings from Last 3 Encounters:  12/03/21 132 lb (59.9 kg)  09/26/21 134 lb (60.8 kg)  08/15/21 135 lb (61.2 kg)  Physical Exam Constitutional: She appears well-developed and well-nourished. No distress.  HENT:  Head: Normocephalic and atraumatic.  Right Ear: External ear normal. Normal ear canal and TM Left Ear: External ear normal.  Normal ear canal and TM Mouth/Throat: Oropharynx is clear and moist.  Eyes: Conjunctivae normal.  Neck: Neck supple. No tracheal deviation present. No thyromegaly present.  No carotid bruit  Cardiovascular: Normal rate, regular rhythm and normal heart sounds.   No murmur heard.  No edema. Pulmonary/Chest: Effort normal and breath sounds normal. No respiratory distress. She has no wheezes. She has no rales.  Breast: deferred   Abdominal: Soft. She exhibits no distension. There is  no tenderness.  Lymphadenopathy: She has no cervical adenopathy.  Skin: Skin is warm and dry. She is not diaphoretic.  Psychiatric: She has a normal mood and affect. Her behavior is normal.     Lab Results  Component Value Date   WBC 12.7 (H) 06/05/2021   HGB 9.9 (L) 06/05/2021   HCT 30.7 (L) 06/05/2021   PLT 293 06/05/2021   GLUCOSE 151 (H) 06/05/2021   CHOL 157 10/21/2019   TRIG 166.0 (H) 10/21/2019   HDL 38.70 (L) 10/21/2019   LDLDIRECT 95.0 11/13/2018   LDLCALC 85 10/21/2019   ALT 25 06/05/2021   AST 13 (L) 06/05/2021   NA 138 06/05/2021   K 3.9 06/05/2021   CL 100 06/05/2021   CREATININE 0.48 06/05/2021   BUN 13 06/05/2021   CO2 27 06/05/2021   TSH 1.18 10/22/2016   INR 1.2 03/06/2021   HGBA1C 5.8 (H) 05/18/2021   MICROALBUR 1.6 10/21/2019         Assessment & Plan:   Physical exam: Screening blood work  ordered Exercise  active but no exercise Weight  normal.  Substance abuse  none   Reviewed recommended immunizations.   Health Maintenance  Topic Date Due   OPHTHALMOLOGY EXAM  05/30/2019   MAMMOGRAM  03/30/2020   Diabetic kidney evaluation - Urine ACR  10/20/2020   FOOT EXAM  10/20/2020   PAP SMEAR-Modifier  08/24/2021   HEMOGLOBIN A1C  11/15/2021   INFLUENZA VACCINE  11/27/2021   COVID-19 Vaccine (1) 12/19/2021 (Originally 05/23/1961)   Diabetic kidney evaluation - GFR measurement  06/05/2022   TETANUS/TDAP  09/02/2022   COLONOSCOPY (Pts 45-58yr Insurance coverage will need to be confirmed)  07/25/2029   Hepatitis C Screening  Completed   HIV Screening  Completed   HPV VACCINES  Aged Out   Zoster Vaccines- Shingrix  Discontinued          See Problem List for Assessment and Plan of chronic medical problems.

## 2021-12-02 NOTE — Patient Instructions (Addendum)
Blood work was ordered.     Medications changes include :   None    Return in about 1 year (around 12/04/2022) for Schedule DEXA-[place], Physical Exam.   Health Maintenance, Female Adopting a healthy lifestyle and getting preventive care are important in promoting health and wellness. Ask your health care provider about: The right schedule for you to have regular tests and exams. Things you can do on your own to prevent diseases and keep yourself healthy. What should I know about diet, weight, and exercise? Eat a healthy diet  Eat a diet that includes plenty of vegetables, fruits, low-fat dairy products, and lean protein. Do not eat a lot of foods that are high in solid fats, added sugars, or sodium. Maintain a healthy weight Body mass index (BMI) is used to identify weight problems. It estimates body fat based on height and weight. Your health care provider can help determine your BMI and help you achieve or maintain a healthy weight. Get regular exercise Get regular exercise. This is one of the most important things you can do for your health. Most adults should: Exercise for at least 150 minutes each week. The exercise should increase your heart rate and make you sweat (moderate-intensity exercise). Do strengthening exercises at least twice a week. This is in addition to the moderate-intensity exercise. Spend less time sitting. Even light physical activity can be beneficial. Watch cholesterol and blood lipids Have your blood tested for lipids and cholesterol at 62 years of age, then have this test every 5 years. Have your cholesterol levels checked more often if: Your lipid or cholesterol levels are high. You are older than 62 years of age. You are at high risk for heart disease. What should I know about cancer screening? Depending on your health history and family history, you may need to have cancer screening at various ages. This may include screening for: Breast  cancer. Cervical cancer. Colorectal cancer. Skin cancer. Lung cancer. What should I know about heart disease, diabetes, and high blood pressure? Blood pressure and heart disease High blood pressure causes heart disease and increases the risk of stroke. This is more likely to develop in people who have high blood pressure readings or are overweight. Have your blood pressure checked: Every 3-5 years if you are 58-68 years of age. Every year if you are 75 years old or older. Diabetes Have regular diabetes screenings. This checks your fasting blood sugar level. Have the screening done: Once every three years after age 68 if you are at a normal weight and have a low risk for diabetes. More often and at a younger age if you are overweight or have a high risk for diabetes. What should I know about preventing infection? Hepatitis B If you have a higher risk for hepatitis B, you should be screened for this virus. Talk with your health care provider to find out if you are at risk for hepatitis B infection. Hepatitis C Testing is recommended for: Everyone born from [birth year] through 1966. Anyone with known risk factors for hepatitis C. Sexually transmitted infections (STIs) Get screened for STIs, including gonorrhea and chlamydia, if: You are sexually active and are younger than 62 years of age. You are older than 62 years of age and your health care provider tells you that you are at risk for this type of infection. Your sexual activity has changed since you were last screened, and you are at increased risk for chlamydia or gonorrhea. Ask your  health care provider if you are at risk. Ask your health care provider about whether you are at high risk for HIV. Your health care provider may recommend a prescription medicine to help prevent HIV infection. If you choose to take medicine to prevent HIV, you should first get tested for HIV. You should then be tested every 3 months for as long as you are taking  the medicine. Pregnancy If you are about to stop having your period (premenopausal) and you may become pregnant, seek counseling before you get pregnant. Take 400 to 800 micrograms (mcg) of folic acid every day if you become pregnant. Ask for birth control (contraception) if you want to prevent pregnancy. Osteoporosis and menopause Osteoporosis is a disease in which the bones lose minerals and strength with aging. This can result in bone fractures. If you are 58 years old or older, or if you are at risk for osteoporosis and fractures, ask your health care provider if you should: Be screened for bone loss. Take a calcium or vitamin D supplement to lower your risk of fractures. Be given hormone replacement therapy (HRT) to treat symptoms of menopause. Follow these instructions at home: Alcohol use Do not drink alcohol if: Your health care provider tells you not to drink. You are pregnant, may be pregnant, or are planning to become pregnant. If you drink alcohol: Limit how much you have to: 0-1 drink a day. Know how much alcohol is in your drink. In the [place] one drink equals one 12 oz bottle of beer (355 mL), one 5 oz glass of wine (148 mL), or one 1 oz glass of hard liquor (44 mL). Lifestyle Do not use any products that contain nicotine or tobacco. These products include cigarettes, chewing tobacco, and vaping devices, such as e-cigarettes. If you need help quitting, ask your health care provider. Do not use street drugs. Do not share needles. Ask your health care provider for help if you need support or information about quitting drugs. General instructions Schedule regular health, dental, and eye exams. Stay current with your vaccines. Tell your health care provider if: You often feel depressed. You have ever been abused or do not feel safe at home. Summary Adopting a healthy lifestyle and getting preventive care are important in promoting health and wellness. Follow your health  care provider's instructions about healthy diet, exercising, and getting tested or screened for diseases. Follow your health care provider's instructions on monitoring your cholesterol and blood pressure. This information is not intended to replace advice given to you by your health care provider. Make sure you discuss any questions you have with your health care provider. Document Revised: 09/04/2020 Document Reviewed: 09/04/2020 Elsevier Patient Education  [place].

## 2021-12-03 ENCOUNTER — Ambulatory Visit (INDEPENDENT_AMBULATORY_CARE_PROVIDER_SITE_OTHER): Payer: 59 | Admitting: Internal Medicine

## 2021-12-03 VITALS — BP 126/70 | HR 80 | Temp 98.0°F | Ht 62.0 in | Wt 132.0 lb

## 2021-12-03 DIAGNOSIS — E7800 Pure hypercholesterolemia, unspecified: Secondary | ICD-10-CM | POA: Diagnosis not present

## 2021-12-03 DIAGNOSIS — Z13820 Encounter for screening for osteoporosis: Secondary | ICD-10-CM | POA: Diagnosis not present

## 2021-12-03 DIAGNOSIS — I2510 Atherosclerotic heart disease of native coronary artery without angina pectoris: Secondary | ICD-10-CM | POA: Diagnosis not present

## 2021-12-03 DIAGNOSIS — Z0000 Encounter for general adult medical examination without abnormal findings: Secondary | ICD-10-CM

## 2021-12-03 DIAGNOSIS — E119 Type 2 diabetes mellitus without complications: Secondary | ICD-10-CM | POA: Diagnosis not present

## 2021-12-03 LAB — MICROALBUMIN / CREATININE URINE RATIO
Creatinine,U: 84.6 mg/dL
Microalb Creat Ratio: 1.5 mg/g (ref 0.0–30.0)
Microalb, Ur: 1.3 mg/dL (ref 0.0–1.9)

## 2021-12-03 LAB — COMPREHENSIVE METABOLIC PANEL
ALT: 21 U/L (ref 0–35)
AST: 23 U/L (ref 0–37)
Albumin: 4.4 g/dL (ref 3.5–5.2)
Alkaline Phosphatase: 61 U/L (ref 39–117)
BUN: 26 mg/dL — ABNORMAL HIGH (ref 6–23)
CO2: 29 mEq/L (ref 19–32)
Calcium: 9.7 mg/dL (ref 8.4–10.5)
Chloride: 101 mEq/L (ref 96–112)
Creatinine, Ser: 0.63 mg/dL (ref 0.40–1.20)
GFR: 95.33 mL/min (ref >60.00)
Glucose, Bld: 126 mg/dL — ABNORMAL HIGH (ref 70–99)
Potassium: 4 mEq/L (ref 3.5–5.1)
Sodium: 145 mEq/L (ref 135–145)
Total Bilirubin: 0.6 mg/dL (ref 0.2–1.2)
Total Protein: 7 g/dL (ref 6.0–8.3)

## 2021-12-03 LAB — CBC WITH DIFFERENTIAL/PLATELET
Basophils Absolute: 0 10*3/uL (ref 0.0–0.1)
Basophils Relative: 0.5 % (ref 0.0–3.0)
Eosinophils Absolute: 0.2 10*3/uL (ref 0.0–0.7)
Eosinophils Relative: 3 % (ref 0.0–5.0)
HCT: 42.7 % (ref 36.0–46.0)
Hemoglobin: 14.2 g/dL (ref 12.0–15.0)
Lymphocytes Relative: 38.3 % (ref 12.0–46.0)
Lymphs Abs: 2.7 10*3/uL (ref 0.7–4.0)
MCHC: 33.2 g/dL (ref 30.0–36.0)
MCV: 96.3 fl (ref 78.0–100.0)
Monocytes Absolute: 0.6 10*3/uL (ref 0.1–1.0)
Monocytes Relative: 7.9 % (ref 3.0–12.0)
Neutro Abs: 3.5 10*3/uL (ref 1.4–7.7)
Neutrophils Relative %: 50.3 % (ref 43.0–77.0)
Platelets: 248 10*3/uL (ref 150.0–400.0)
RBC: 4.43 Mil/uL (ref 3.87–5.11)
RDW: 13.2 % (ref 11.5–15.5)
WBC: 7 10*3/uL (ref 4.0–10.5)

## 2021-12-03 LAB — LIPID PANEL
Cholesterol: 165 mg/dL (ref 0–200)
HDL: 52.4 mg/dL (ref >39.00)
LDL Cholesterol: 76 mg/dL (ref 0–99)
NonHDL: 112.15
Total CHOL/HDL Ratio: 3
Triglycerides: 182 mg/dL — ABNORMAL HIGH (ref 0.0–149.0)
VLDL: 36.4 mg/dL (ref 0.0–40.0)

## 2021-12-03 LAB — TSH: TSH: 2.8 u[IU]/mL (ref 0.35–5.50)

## 2021-12-03 LAB — HEMOGLOBIN A1C: Hgb A1c MFr Bld: 5.6 % (ref 4.6–6.5)

## 2021-12-03 NOTE — Assessment & Plan Note (Signed)
Chronic Nonobstructive CAD by coronary CTA-2013 No symptoms consistent with angina

## 2021-12-03 NOTE — Assessment & Plan Note (Signed)
Chronic  Lab Results  Component Value Date   HGBA1C 5.8 (H) 05/18/2021   Sugars well controlled-sugars in prediabetic range when last checked Check A1c, urine microalbumin today Continue lifestyle control Stressed regular exercise, diabetic diet

## 2021-12-03 NOTE — Assessment & Plan Note (Signed)
Chronic Regular exercise and healthy diet encouraged Check lipid panel  Continue lifestyle control

## 2021-12-04 ENCOUNTER — Ambulatory Visit: Payer: 59 | Admitting: Family Medicine

## 2021-12-04 ENCOUNTER — Ambulatory Visit (INDEPENDENT_AMBULATORY_CARE_PROVIDER_SITE_OTHER)
Admission: RE | Admit: 2021-12-04 | Discharge: 2021-12-04 | Disposition: A | Discharge status: Home / Self Care | Payer: 59 | Source: Ambulatory Visit | Attending: Internal Medicine | Admitting: Internal Medicine

## 2021-12-04 ENCOUNTER — Encounter: Payer: Self-pay | Admitting: Internal Medicine

## 2021-12-04 VITALS — BP 116/74 | HR 71 | Ht 62.0 in | Wt 132.0 lb

## 2021-12-04 DIAGNOSIS — M5030 Other cervical disc degeneration, unspecified cervical region: Secondary | ICD-10-CM

## 2021-12-04 DIAGNOSIS — Z13820 Encounter for screening for osteoporosis: Secondary | ICD-10-CM

## 2021-12-04 DIAGNOSIS — M9901 Segmental and somatic dysfunction of cervical region: Secondary | ICD-10-CM

## 2021-12-04 DIAGNOSIS — M9904 Segmental and somatic dysfunction of sacral region: Secondary | ICD-10-CM | POA: Diagnosis not present

## 2021-12-04 DIAGNOSIS — M9908 Segmental and somatic dysfunction of rib cage: Secondary | ICD-10-CM

## 2021-12-04 DIAGNOSIS — M9902 Segmental and somatic dysfunction of thoracic region: Secondary | ICD-10-CM

## 2021-12-04 DIAGNOSIS — M9903 Segmental and somatic dysfunction of lumbar region: Secondary | ICD-10-CM | POA: Diagnosis not present

## 2021-12-04 DIAGNOSIS — M8580 Other specified disorders of bone density and structure, unspecified site: Secondary | ICD-10-CM | POA: Insufficient documentation

## 2021-12-04 NOTE — Patient Instructions (Addendum)
Good to see you! Keep being an advocate for your health Don't let anything stop you

## 2021-12-04 NOTE — Assessment & Plan Note (Signed)
Degenerative disc disease.  Discussed icing regimen and home exercise, which activities to do which ones to avoid.  Is responding well to manipulation.  Discussed the gabapentin again.  Continue this.  Could consider other medication such as Cymbalta if necessary in the long run.  Patient will consider it.  Follow-up again 6 to 8 weeks

## 2021-12-17 ENCOUNTER — Ambulatory Visit (INDEPENDENT_AMBULATORY_CARE_PROVIDER_SITE_OTHER): Payer: 59

## 2021-12-17 ENCOUNTER — Ambulatory Visit: Payer: 59 | Admitting: Orthopaedic Surgery

## 2021-12-17 DIAGNOSIS — M25551 Pain in right hip: Secondary | ICD-10-CM

## 2021-12-17 NOTE — Progress Notes (Signed)
Office Visit Note   Patient: Sandra [name]           Date of Birth: 10-12-1960           MRN: [id number] Visit Date: 12/17/2021              Requested by: [name], MD [place],  [place] [address] PCP: [name], MD   Assessment & Plan: Visit Diagnoses:  1. Pain of right hip     Plan: Given the severity of her right hip pain combined with locking and catching as well as due to the failure of conservative treatment, a MRI arthrogram is warranted of the right hip to rule out a labral tear as well as to assess the gluteus medius and minimus tendon and the cartilage of the hip itself.  She agrees with this treatment plan.  I think this is the next option and is medically necessary given the failure of all conservative treatment modalities for over a year now.  She will follow-up with [place] once we have that MRI arthrogram of the right hip.  Follow-Up Instructions: No follow-ups on file.   Orders:  Orders Placed This Encounter  Procedures   XR HIP UNILAT W OR W/O PELVIS 1V RIGHT   No orders of the defined types were placed in this encounter.     Procedures: No procedures performed   Clinical Data: No additional findings.   Subjective: Chief Complaint  Patient presents with   Right Hip - Pain  The patient is a 61 year old female I am seeing for the first time with worsening right hip pain for many years now.  This started after heart mechanical fall in the right hip and she had pain on the lateral aspect of her hip but now is started in the groin and over the coccyx area of her hip.  She says she walks sometimes with a limp and she been sitting for a while hurts quite a bit.  It is mainly in the groin and this is getting worse.  There has been some locking and catching as well.  She has had steroid injections in the past and that has not really helped much.  She is a thin individual and has worked on activity modification and taking  anti-inflammatories.  She is also on gabapentin.  HPI  Review of Systems There is currently no listed fever, chills, nausea, vomiting  Objective: Vital Signs: There were no vitals taken for this visit.  Physical Exam She is alert and orient x3 and in no acute distress Ortho Exam Examination of both hips show the move smoothly and fluidly with no blocks to rotation.  Right hip hurts on the extremes of internal and external rotation as well as hurts to palpation of the trochanteric area of the hip. Specialty Comments:  No specialty comments available.  Imaging: XR HIP UNILAT W OR W/O PELVIS 1V RIGHT  Result Date: 12/17/2021 An AP pelvis and lateral the right hip shows no acute findings.  The hip joint space is well-maintained and congruent.  There are no cortical irregularities that can be seen around the trochanteric area or the hip itself.  The left hip also appears normal as does other bony aspects of the pelvis that can be seen.    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 09/26/2021   Tear  of LCL (lateral collateral ligament) of knee, left, initial encounter 07/11/2021   Hypophosphatemia 06/03/2021   Perforation of gallbladder in cholecystitis s/p subtotal cholecystectomy 05/31/2021 05/31/2021   Gallbladder abscess 05/02/2021   Abscess 04/06/2021   Intra-abdominal abscess ([place]) 03/06/2021   Normocytic anemia 03/06/2021   Hypokalemia 03/06/2021   Hyponatremia 03/06/2021   Thrombocytosis 03/06/2021   Moderate protein-calorie malnutrition ([place]) 03/06/2021   Abdominal pain 02/17/2021   Right carpal tunnel syndrome 11/10/2020   Labral tear of right hip joint 11/10/2020   Chronic right hip pain 04/10/2020   Nonallopathic lesion of sacral region 08/05/2019   Greater trochanteric bursitis, right 08/05/2019   Benign neoplasm of descending colon    Low back  pain 05/27/2019   Hematoma of right hip 05/07/2019   Trigger finger, right index finger 02/17/2019   Anxiety 11/12/2018   Left lateral epicondylitis 05/14/2018   Left elbow pain 05/04/2018   Right rotator cuff tear 08/20/2017   Cervical disc disorder with radiculopathy of cervical region 04/25/2016   Nonallopathic lesion of cervical region 04/17/2016   Nonallopathic lesion of thoracic region 04/17/2016   Nonallopathic lesion of rib cage 04/17/2016   Nonallopathic lesion of lumbosacral region 04/17/2016   Degenerative cervical disc 03/29/2016   Degenerative arthritis of right knee 03/29/2016   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   Adjustment disorder with mixed anxiety and depressed mood 07/14/2014   Personal history of colonic polyps    History of Roux-en-Y gastric bypass, 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   Chronic constipation 03/05/2013   Coronary atherosclerosis of native coronary artery 08/27/2011   History of uterine cancer 08/15/2011   Type 2 diabetes mellitus ([place]) 08/15/2011   Hypercholesterolemia 08/15/2011   Past Medical History:  Diagnosis Date   Anxiety    Arthritis    CAD (coronary artery disease)    nonobstructive CAD in LAD per 2013 CTA coronary morph   Cholelithiasis 04/06/2021   Depression    Diabetes mellitus    no meds taken now, checks cbg once or twice a week    Difficult intubation    had to use the glidescope due to small airway    Gallbladder abscess 05/02/2021   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 HYSTERECTOMY   STAGE I ENDOMETROID CARCINOMA   History of kidney stones    Hx of adenomatous colonic polyps 2011    BENIGN   Hyperlipidemia    no meds now   Hypertension    Pneumonia    Sleep apnea    no CPAP since surgery    Family History  Adopted: Yes    Past Surgical History:  Procedure Laterality Date   ABDOMINAL HYSTERECTOMY  12-20-2004   TAH   BILATERAL BREAST REDUCTION  11-16-1999   BIOPSY  02/20/2021   Procedure: BIOPSY;  Surgeon: Daryel November, MD;  Location: [facility];  Service: Gastroenterology;;   [place] N/A 05/27/2013   Procedure: Lauris Chroman;  Surgeon: Shann Medal, MD;  Location: [facility];  Service: General;  Laterality: N/A;   [place] N/A 05/31/2021   Procedure: LAPAROSCOPIC  LYSIS OF ADHESIONS, PARTIAL CHOLECYSTECTOMY, DRAIN PLACEMENT, WEDGE LIVER BIOPSY;  Surgeon: Michael Boston, MD;  Location: [facility];  Service: General;  Laterality: N/A;   COLONOSCOPY     COLONOSCOPY N/A 04/25/2014   Procedure: COLONOSCOPY;  Surgeon: Gatha Mayer, MD;  Location: [facility];  Service: Endoscopy;  Laterality: N/A;   COLONOSCOPY WITH PROPOFOL N/A 07/26/2019   Procedure: COLONOSCOPY WITH PROPOFOL;  Surgeon: Gatha Mayer, MD;  Location: [facility];  Service: Endoscopy;  Laterality: N/A;   CYSTOSCOPY  09/06/2011   Procedure: CYSTOSCOPY;  Surgeon: Ailene Rud, MD;  Location[facility];  Service: Urology;  Laterality: N/A;  LYNX SLING    ESOPHAGOGASTRODUODENOSCOPY (EGD) WITH PROPOFOL N/A 09/27/2014   Procedure: ESOPHAGOGASTRODUODENOSCOPY (EGD) WITH PROPOFOL;  Surgeon: Gatha Mayer, MD;  Location: [facility];  Service: Endoscopy;  Laterality: N/A;   ESOPHAGOGASTRODUODENOSCOPY (EGD) WITH PROPOFOL N/A 03/11/2016   Procedure: ESOPHAGOGASTRODUODENOSCOPY (EGD) WITH PROPOFOL;  Surgeon: Jerene Bears, MD;  Location: [facility];  Service: Endoscopy;  Laterality: N/A;   ESOPHAGOGASTRODUODENOSCOPY (EGD) WITH PROPOFOL Left 02/20/2021   Procedure: ESOPHAGOGASTRODUODENOSCOPY (EGD) WITH PROPOFOL;  Surgeon:  Daryel November, MD;  Location: [facility];  Service: Gastroenterology;  Laterality: Left;   GASTRIC BYPASS  10/04/2013   GASTRIC ROUX-EN-Y N/A 10/04/2013   Procedure: LAPAROSCOPIC ROUX-EN-Y GASTRIC BYPASS WITH UPPER ENDOSCOPY;  Surgeon: Shann Medal, MD;  Location: [facility];  Service: General;  Laterality: N/A;   HEMORRHOID BANDING  02/2013   IR CHOLANGIOGRAM EXISTING TUBE  05/01/2021   IR PERC CHOLECYSTOSTOMY  03/06/2021   IR RADIOLOGIST EVAL & MGMT  04/12/2021   POLYPECTOMY     POLYPECTOMY  07/26/2019   Procedure: POLYPECTOMY;  Surgeon: Gatha Mayer, MD;  Location: [facility];  Service: Endoscopy;;   PUBOVAGINAL SLING  09/06/2011   Procedure: Gaynelle Arabian;  Surgeon: Ailene Rud, MD;  [facility];  Service: Urology;  Laterality: N/A;   REDUCTION MAMMAPLASTY Bilateral 2001   SHOULDER ARTHROSCOPY Right 10/23/2017   Procedure: RIGHT SHOULDER ARTHROSCOPIC ROTATOR CUFF REPAIR VS. DEBRIDEMENT, SUBACROMIAL DECOMPARESSION, DISTAL CLAVICAL EXCISION;  Surgeon: Tania Ade, MD;  Location: [facility];  Service: Orthopedics;  Laterality: Right;   UVULOPALATOPHARYNGOPLASTY, TONSILLECTOMY AND SEPTOPLASTY  1994   Social History   Occupational History   Occupation:  endoscopy endo tech  Tobacco Use   Smoking status: Never   Smokeless tobacco: Never  Vaping Use   Vaping Use: Never used  Substance and Sexual Activity   Alcohol use: No    Alcohol/week: 0.0 standard drinks of alcohol   Drug use: No   Sexual activity: Not Currently    Birth control/protection: Surgical    Comment: HYST.. 1st intercourse- 13, partners- 6

## 2021-12-18 ENCOUNTER — Other Ambulatory Visit: Payer: Self-pay

## 2021-12-18 DIAGNOSIS — M25551 Pain in right hip: Secondary | ICD-10-CM

## 2022-01-11 ENCOUNTER — Other Ambulatory Visit: Payer: Self-pay | Admitting: Orthopaedic Surgery

## 2022-01-11 ENCOUNTER — Ambulatory Visit
Admission: RE | Admit: 2022-01-11 | Discharge: 2022-01-11 | Disposition: A | Discharge status: Home / Self Care | Payer: 59 | Source: Ambulatory Visit | Attending: Orthopaedic Surgery | Admitting: Orthopaedic Surgery

## 2022-01-11 DIAGNOSIS — M25551 Pain in right hip: Secondary | ICD-10-CM

## 2022-01-11 DIAGNOSIS — S73191A Other sprain of right hip, initial encounter: Secondary | ICD-10-CM | POA: Diagnosis not present

## 2022-01-11 MED ORDER — IOPAMIDOL (ISOVUE-M 200) INJECTION 41%
18.0000 mL | Freq: Once | INTRAMUSCULAR | Status: AC
  Administered 2022-01-11: 18 mL via INTRA_ARTICULAR

## 2022-01-14 ENCOUNTER — Ambulatory Visit: Payer: 59 | Admitting: Orthopaedic Surgery

## 2022-01-21 NOTE — Progress Notes (Deleted)
[place] Phone: [phone] Subjective:    I'm seeing this patient by the request  of:  Burns, Claudina Lick, MD  CC:   [id number]  Sandra [name] is a 61 y.o. female coming in with complaint of back and neck pain. OMT 12/04/2021. Patient states   Medications patient has been prescribed: Gabapentin  Taking:         Reviewed prior external information including notes and imaging from previsou exam, outside providers and external EMR if available.   As well as notes that were available from care everywhere and other healthcare systems.  Past medical history, social, surgical and family history all reviewed in electronic medical record.  No pertanent information unless stated regarding to the chief complaint.   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 HYSTERECTOMY   STAGE I ENDOMETROID CARCINOMA   History of kidney stones    Hx of adenomatous colonic polyps 2011   BENIGN   Hyperlipidemia    no meds now   Hypertension    Pneumonia    Sleep apnea    no CPAP since surgery    Allergies  Allergen Reactions   Nsaids     History ulcers.  H/o gastric bypass   Statins     Muscle  aches     Review of Systems:  No headache, visual changes, nausea, vomiting, diarrhea, constipation, dizziness, abdominal pain, skin rash, fevers, chills, night sweats, weight loss, swollen lymph nodes, body aches, joint swelling, chest pain, shortness of breath, mood changes. POSITIVE muscle aches  Objective  There were no vitals taken for this visit.   General: No apparent distress alert and oriented x3 mood and affect normal, dressed appropriately.  HEENT: Pupils equal, extraocular movements intact  Respiratory: Patient's speak in full sentences and does not appear short of breath  Cardiovascular: No lower extremity edema, non tender, no erythema  Gait MSK:  Back   Osteopathic findings  C2 flexed rotated and side bent right C6 flexed rotated and side bent left T3 extended rotated and side bent right inhaled rib T9 extended rotated and side bent left L2 flexed rotated and side bent right Sacrum right on right       Assessment and Plan:  No problem-specific Assessment & Plan notes found for this encounter.    Nonallopathic problems  Decision today to treat with OMT was based on Physical Exam  After verbal consent patient was treated with HVLA, ME, FPR techniques in cervical, rib, thoracic, lumbar, and sacral  areas  Patient tolerated the procedure well with improvement in  symptoms  Patient given exercises, stretches and lifestyle modifications  See medications in patient instructions if given  Patient will follow up in 4-8 weeks             Note: This dictation was prepared with Dragon dictation along with smaller phrase technology. Any transcriptional errors that result from this process are unintentional.

## 2022-01-22 ENCOUNTER — Ambulatory Visit: Payer: 59 | Admitting: Family Medicine

## 2022-01-22 ENCOUNTER — Other Ambulatory Visit (HOSPITAL_COMMUNITY): Payer: Self-pay

## 2022-01-24 ENCOUNTER — Other Ambulatory Visit: Payer: Self-pay | Admitting: Family Medicine

## 2022-01-24 ENCOUNTER — Other Ambulatory Visit (HOSPITAL_COMMUNITY): Payer: Self-pay

## 2022-01-24 MED ORDER — GABAPENTIN 300 MG PO CAPS
300.0000 mg | ORAL_CAPSULE | Freq: Two times a day (BID) | ORAL | 0 refills | Status: DC
  Filled 2022-01-24: qty 180, 90d supply, fill #0

## 2022-01-28 ENCOUNTER — Encounter: Payer: Self-pay | Admitting: Orthopaedic Surgery

## 2022-01-28 ENCOUNTER — Ambulatory Visit: Payer: 59 | Admitting: Orthopaedic Surgery

## 2022-01-28 DIAGNOSIS — M25551 Pain in right hip: Secondary | ICD-10-CM | POA: Diagnosis not present

## 2022-01-28 DIAGNOSIS — M1611 Unilateral primary osteoarthritis, right hip: Secondary | ICD-10-CM | POA: Insufficient documentation

## 2022-01-28 NOTE — Progress Notes (Signed)
The patient comes in today to go over MRI of her right hip.  She been dealing with right hip and groin pain for some time now with x-rays that did not show any significant pathology of her hip.  She does have a family history of her mother having hip replacement.  She still hurts in the groin quite a bit.  She hurts with weightbearing activities and at this point is detrimentally affecting her mobility, her quality of life and her actives daily living.  Exam her right hip still move smoothly and fluidly but has severe pain in the groin.  The MRI arthrogram of her right hip was reviewed with her.  There is a large degenerative tearing of the superior and anterior labrum as well as there is a partial-thickness cartilage loss of the weightbearing surface of the femoral head and acetabulum.  I went over these findings with her and a hip replacement model.  We talked about this type of surgery in detail.  She has tried and failed conservative treatment for well over a year now and this occludes activity modification and therapy as well as injections rest and anti-inflammatories.  Given her MRI findings and her clinical exam findings this is the neck step for her and she does wish to proceed with this in the near future.  She has our surgery scheduler's card and will let us know when she would like to have this scheduled.  She is not a diabetic and does not have any significant current medical issues.  All questions and concerns were answered and addressed.

## 2022-01-30 ENCOUNTER — Ambulatory Visit
Admission: RE | Admit: 2022-01-30 | Discharge: 2022-01-30 | Disposition: A | Discharge status: Home / Self Care | Payer: 59 | Source: Ambulatory Visit | Attending: Obstetrics & Gynecology | Admitting: Obstetrics & Gynecology

## 2022-01-30 DIAGNOSIS — Z1231 Encounter for screening mammogram for malignant neoplasm of breast: Secondary | ICD-10-CM | POA: Diagnosis not present

## 2022-01-31 ENCOUNTER — Telehealth: Payer: Self-pay

## 2022-01-31 NOTE — Telephone Encounter (Signed)
Patient left voice mail inquiring about scheduling surgery.  I called patient back, no answer and voice mail is full.

## 2022-02-04 NOTE — Progress Notes (Unsigned)
Sandra [name] [address] [place] Phone: [phone] Subjective:   Sandra [name], am serving as a scribe for Dr. Hulan Saas.  I'm seeing this patient by the request  of:  [name], MD  CC: Hand pain, knee pain, follow-up with pain  TDD:[id number]  Sandra [name] is a 61 y.o. female coming in with complaint of back and neck pain. OMT on 12/04/2021. Patient states having some right hand and knee pain today.  Has had some arthritic changes of the knee previously.  Starting to affect daily activities.  Sometimes feels like she has some instability.  Medications patient has been prescribed: Gabapentin  Taking:         Reviewed prior external information including notes and imaging from previsou exam, outside providers and external EMR if available.   As well as notes that were available from care everywhere and other healthcare systems.  Past medical history, social, surgical and family history all reviewed in electronic medical record.  No pertanent information unless stated regarding to the chief complaint.   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 HYSTERECTOMY   STAGE I ENDOMETROID CARCINOMA    History of kidney stones    Hx of adenomatous colonic polyps 2011   BENIGN   Hyperlipidemia    no meds now   Hypertension    Pneumonia    Sleep apnea    no CPAP since surgery    Allergies  Allergen Reactions   Nsaids     History ulcers.  H/o gastric bypass   Statins     Muscle aches     Review of Systems:  No headache, visual changes, nausea, vomiting, diarrhea, constipation, dizziness, abdominal pain, skin rash, fevers, chills, night sweats, weight loss, swollen lymph nodes, body aches, joint swelling, chest pain, shortness of breath, mood changes. POSITIVE muscle aches  Objective  Pulse 65, height 5' 2"  (1.575 m), weight 138 lb (62.6 kg), SpO2 97 %.   General: No apparent distress alert and oriented x3 mood and affect normal, dressed appropriately.  HEENT: Pupils equal, extraocular movements intact  Respiratory: Patient's speak in full sentences and does not appear short of breath  Cardiovascular: No lower extremity edema, non tender, no erythema  Gait MSK:  Back low back exam does have some loss of lordosis.  Tightness noted in the cervical region.  Patient also has trigger nodule noted at the A2 pulley of the index finger of the right hand.  Right knee trace effusion noted.  Lateral tracking of the patella noted.  Osteopathic findings  C5 flexed rotated and  side bent left T3 extended rotated and side bent right inhaled rib L3 flexed rotated and side bent right Sacrum right on right  After informed written and verbal consent, patient was seated on exam table. Right knee was prepped with alcohol swab and utilizing anterolateral approach, patient's right knee space was injected with 4:1  marcaine 0.5%: Kenalog 72m/dL. Patient tolerated the procedure well without immediate complications.  Procedure: Real-time Ultrasound Guided Injection of right index flexor tendon sheath Device: GE Logiq Q7 Ultrasound guided injection is preferred based studies that show increased  duration, increased effect, greater accuracy, decreased procedural pain, increased response rate, and decreased cost with ultrasound guided versus blind injection.  Verbal informed consent obtained.  Time-out conducted.  Noted no overlying erythema, induration, or other signs of local infection.  Skin prepped in a sterile fashion.  Local anesthesia: Topical Ethyl chloride.  With sterile technique and under real time ultrasound guidance: With a 25-gauge half inch needle injected with 0.5 cc of 0.5% Marcaine and 0.5 cc of Kenalog 40 mg per Completed without difficulty  Pain immediately resolved suggesting accurate placement of the medication.  Advised to call if fevers/chills, erythema, induration, drainage, or persistent bleeding.  Impression: Technically successful ultrasound guided injection.    Assessment and Plan:  Degenerative arthritis of right knee Patient given injection today and tolerated the procedure well, discussed icing regimen and home exercises, discussed which activities to do and which ones to avoid.  We discussed different medications including gabapentin 300 mg twice daily.  This could be some lumbar radiculopathy.  Could be candidate for viscosupplementation as well.  Follow-up again in 6 to 8 weeks  Degenerative cervical disc Chronic problem with mild exacerbation as well.  Gabapentin 300 mg twice daily discussed.  We discussed continuing this or potentially increasing to 3 times a day if necessary.  Responding well to manipulation.  Follow-up again in 6 to 8 weeks.  Trigger finger, right index finger Repeat injection given today, tolerated the procedure well, discussed icing regimen and home exercises, discussed which activities will be more helpful.  Follow-up with me again in 6 to 8 weeks.  Worsening pain could consider referral but hopefully patient will not be needing that.    Nonallopathic problems  Decision today to treat with OMT was based on Physical  Exam  After verbal consent patient was treated with HVLA, ME, FPR techniques in cervical, rib, thoracic, lumbar, and sacral  areas  Patient tolerated the procedure well with improvement in symptoms  Patient given exercises, stretches and lifestyle modifications  See medications in patient instructions if given  Patient will follow up in 4-8 weeks     The above documentation has been reviewed and is accurate and complete ZLyndal Pulley DO         Note: This dictation was prepared with Dragon dictation along with smaller phrase technology. Any transcriptional errors that result from this process are unintentional.

## 2022-02-05 ENCOUNTER — Ambulatory Visit: Payer: 59 | Admitting: Family Medicine

## 2022-02-05 ENCOUNTER — Ambulatory Visit: Payer: Self-pay

## 2022-02-05 VITALS — HR 65 | Ht 62.0 in | Wt 138.0 lb

## 2022-02-05 DIAGNOSIS — M9908 Segmental and somatic dysfunction of rib cage: Secondary | ICD-10-CM | POA: Diagnosis not present

## 2022-02-05 DIAGNOSIS — M65321 Trigger finger, right index finger: Secondary | ICD-10-CM | POA: Diagnosis not present

## 2022-02-05 DIAGNOSIS — M9903 Segmental and somatic dysfunction of lumbar region: Secondary | ICD-10-CM | POA: Diagnosis not present

## 2022-02-05 DIAGNOSIS — M9901 Segmental and somatic dysfunction of cervical region: Secondary | ICD-10-CM

## 2022-02-05 DIAGNOSIS — M9902 Segmental and somatic dysfunction of thoracic region: Secondary | ICD-10-CM

## 2022-02-05 DIAGNOSIS — M5030 Other cervical disc degeneration, unspecified cervical region: Secondary | ICD-10-CM

## 2022-02-05 DIAGNOSIS — M9904 Segmental and somatic dysfunction of sacral region: Secondary | ICD-10-CM | POA: Diagnosis not present

## 2022-02-05 DIAGNOSIS — M1711 Unilateral primary osteoarthritis, right knee: Secondary | ICD-10-CM | POA: Diagnosis not present

## 2022-02-05 DIAGNOSIS — M79641 Pain in right hand: Secondary | ICD-10-CM

## 2022-02-05 NOTE — Patient Instructions (Addendum)
Injection in R knee and finger today Good to see you!

## 2022-02-05 NOTE — Assessment & Plan Note (Signed)
Repeat injection given today, tolerated the procedure well, discussed icing regimen and home exercises, discussed which activities will be more helpful.  Follow-up with me again in 6 to 8 weeks.  Worsening pain could consider referral but hopefully patient will not be needing that.

## 2022-02-05 NOTE — Assessment & Plan Note (Signed)
Chronic problem with mild exacerbation as well.  Gabapentin 300 mg twice daily discussed.  We discussed continuing this or potentially increasing to 3 times a day if necessary.  Responding well to manipulation.  Follow-up again in 6 to 8 weeks.

## 2022-02-05 NOTE — Assessment & Plan Note (Signed)
Patient given injection today and tolerated the procedure well, discussed icing regimen and home exercises, discussed which activities to do and which ones to avoid.  We discussed different medications including gabapentin 300 mg twice daily.  This could be some lumbar radiculopathy.  Could be candidate for viscosupplementation as well.  Follow-up again in 6 to 8 weeks

## 2022-02-11 ENCOUNTER — Telehealth: Payer: Self-pay | Admitting: Orthopaedic Surgery

## 2022-02-11 NOTE — Telephone Encounter (Signed)
[employer] forms received. To [employer]. 

## 2022-02-12 ENCOUNTER — Other Ambulatory Visit: Payer: Self-pay

## 2022-02-19 DIAGNOSIS — H40013 Open angle with borderline findings, low risk, bilateral: Secondary | ICD-10-CM | POA: Diagnosis not present

## 2022-02-19 DIAGNOSIS — H35371 Puckering of macula, right eye: Secondary | ICD-10-CM | POA: Diagnosis not present

## 2022-02-19 DIAGNOSIS — Z961 Presence of intraocular lens: Secondary | ICD-10-CM | POA: Diagnosis not present

## 2022-02-19 DIAGNOSIS — S0511XD Contusion of eyeball and orbital tissues, right eye, subsequent encounter: Secondary | ICD-10-CM | POA: Diagnosis not present

## 2022-03-07 ENCOUNTER — Telehealth: Payer: Self-pay

## 2022-03-07 ENCOUNTER — Encounter: Payer: Self-pay | Admitting: Family Medicine

## 2022-03-07 NOTE — Telephone Encounter (Signed)
Fyi-Pt lvm on my phone stating she will be in Friday to pay her 25 dollars and her forms.

## 2022-03-07 NOTE — Telephone Encounter (Signed)
Noted  

## 2022-03-08 ENCOUNTER — Other Ambulatory Visit: Payer: Self-pay | Admitting: Internal Medicine

## 2022-03-08 ENCOUNTER — Other Ambulatory Visit (HOSPITAL_COMMUNITY): Payer: Self-pay

## 2022-03-10 MED ORDER — OMEPRAZOLE 40 MG PO CPDR
40.0000 mg | DELAYED_RELEASE_CAPSULE | Freq: Two times a day (BID) | ORAL | 1 refills | Status: DC
  Filled 2022-03-10: qty 180, 90d supply, fill #0
  Filled 2022-10-16: qty 180, 90d supply, fill #1

## 2022-03-11 ENCOUNTER — Other Ambulatory Visit (HOSPITAL_COMMUNITY): Payer: Self-pay

## 2022-03-15 NOTE — Patient Instructions (Addendum)
SURGICAL WAITING ROOM VISITATION Patients having surgery or a procedure may have no more than 2 support people in the waiting area - these visitors may rotate.   Children under the age of 42 must have an adult with them who is not the patient. If the patient needs to stay at the hospital during part of their recovery, the visitor guidelines for inpatient rooms apply. Pre-op nurse will coordinate an appropriate time for 1 support person to accompany patient in pre-op.  This support person may not rotate.    Please refer to the [facility] website for the visitor guidelines for Inpatients (after your surgery is over and you are in a regular room).       Your procedure is scheduled on:  03/29/2022    Report to [facility] Main Entrance    Report to admitting at   0730AM   Call this number if you have problems the morning of surgery [phone]   Do not eat food :After Midnight.   After Midnight you may have the following liquids until ___ 0700___ AM  DAY OF SURGERY  Water Non-Citrus Juices (without pulp, NO RED) Carbonated Beverages Black Coffee (NO MILK/CREAM OR CREAMERS, sugar ok)  Clear Tea (NO MILK/CREAM OR CREAMERS, sugar ok) regular and decaf                             Plain Jell-O (NO RED)                                           Fruit ices (not with fruit pulp, NO RED)                                     Popsicles (NO RED)                                                               Sports drinks like Gatorade (NO RED)                          If you have questions, please contact your surgeon's office.     Oral Hygiene is also important to reduce your risk of infection.                                    Remember - BRUSH YOUR TEETH THE MORNING OF SURGERY WITH YOUR REGULAR TOOTHPASTE   Do NOT smoke after Midnight   Take these medicines the morning of surgery with A SIP OF WATER:  gabapentin   DO NOT TAKE ANY ORAL DIABETIC MEDICATIONS DAY OF YOUR  SURGERY  Bring CPAP mask and tubing day of surgery.                              You may not have any metal on your body including hair pins, jewelry, and body piercing  Do not wear make-up, lotions, powders, perfumes/cologne, or deodorant  Do not wear nail polish including gel and S&S, artificial/acrylic nails, or any other type of covering on natural nails including finger and toenails. If you have artificial nails, gel coating, etc. that needs to be removed by a nail salon please have this removed prior to surgery or surgery may need to be canceled/ delayed if the surgeon/ anesthesia feels like they are unable to be safely monitored.   Do not shave  48 hours prior to surgery.               Men may shave face and neck.   Do not bring valuables to the hospital. [place].   Contacts, dentures or bridgework may not be worn into surgery.   Bring small overnight bag day of surgery.   DO NOT [place]. PHARMACY WILL DISPENSE MEDICATIONS LISTED ON YOUR MEDICATION LIST TO YOU DURING YOUR ADMISSION [place]!    Patients discharged on the day of surgery will not be allowed to drive home.  Someone NEEDS to stay with you for the first 24 hours after anesthesia.   Special Instructions: Bring a copy of your healthcare power of attorney and living will documents the day of surgery if you haven't scanned them before.              Please read over the following fact sheets you were given: IF [place] [phone]   If you received a COVID test during your pre-op visit  it is requested that you wear a mask when out in public, stay away from anyone that may not be feeling well and notify your surgeon if you develop symptoms. If you test positive for Covid or have been in contact with anyone that has tested positive in the last 10 days please notify  you surgeon.    [place] - Preparing for Surgery Before surgery, you can play an important role.  Because skin is not sterile, your skin needs to be as free of germs as possible.  You can reduce the number of germs on your skin by washing with CHG (chlorahexidine gluconate) soap before surgery.  CHG is an antiseptic cleaner which kills germs and bonds with the skin to continue killing germs even after washing. Please DO NOT use if you have an allergy to CHG or antibacterial soaps.  If your skin becomes reddened/irritated stop using the CHG and inform your nurse when you arrive at Short Stay. Do not shave (including legs and underarms) for at least 48 hours prior to the first CHG shower.  You may shave your face/neck. Please follow these instructions carefully:  1.  Shower with CHG Soap the night before surgery and the  morning of Surgery.  2.  If you choose to wash your hair, wash your hair first as usual with your  normal  shampoo.  3.  After you shampoo, rinse your hair and body thoroughly to remove the  shampoo.                           4.  Use CHG as you would any other liquid soap.  You can apply chg directly  to the skin and wash  Gently with a scrungie or clean washcloth.  5.  Apply the CHG Soap to your body ONLY FROM THE NECK DOWN.   Do not use on face/ open                           Wound or open sores. Avoid contact with eyes, ears mouth and genitals (private parts).                       Wash face,  Genitals (private parts) with your normal soap.             6.  Wash thoroughly, paying special attention to the area where your surgery  will be performed.  7.  Thoroughly rinse your body with warm water from the neck down.  8.  DO NOT shower/wash with your normal soap after using and rinsing off  the CHG Soap.                9.  Pat yourself dry with a clean towel.            10.  Wear clean pajamas.            11.  Place clean sheets on your bed the night of your  first shower and do not  sleep with pets. Day of Surgery : Do not apply any lotions/deodorants the morning of surgery.  Please wear clean clothes to the hospital/surgery center.  FAILURE TO FOLLOW THESE INSTRUCTIONS MAY RESULT IN THE CANCELLATION OF YOUR SURGERY PATIENT SIGNATURE_________________________________  NURSE SIGNATURE__________________________________  ________________________________________________________________________

## 2022-03-15 NOTE — Progress Notes (Signed)
Anesthesia Review:  PCP: Cardiologist : Chest x-ray : EKG : Echo : Stress test: Cardiac Cath :  Activity level:  Sleep Study/ CPAP : Fasting Blood Sugar :      / Checks Blood Sugar -- times a day:   Blood Thinner/ Instructions /Last Dose: ASA / Instructions/ Last Dose : Anesthesia Review:  :

## 2022-03-18 ENCOUNTER — Ambulatory Visit (INDEPENDENT_AMBULATORY_CARE_PROVIDER_SITE_OTHER): Payer: 59

## 2022-03-18 ENCOUNTER — Ambulatory Visit: Payer: 59 | Admitting: Sports Medicine

## 2022-03-18 ENCOUNTER — Encounter (HOSPITAL_COMMUNITY): Payer: Self-pay

## 2022-03-18 ENCOUNTER — Other Ambulatory Visit: Payer: Self-pay

## 2022-03-18 ENCOUNTER — Encounter (HOSPITAL_COMMUNITY)
Admission: RE | Admit: 2022-03-18 | Discharge: 2022-03-18 | Disposition: A | Discharge status: Home / Self Care | Payer: 59 | Source: Ambulatory Visit | Attending: Orthopaedic Surgery | Admitting: Orthopaedic Surgery

## 2022-03-18 VITALS — BP 127/75 | HR 67 | Temp 98.9°F | Resp 16 | Ht 62.0 in | Wt 132.0 lb

## 2022-03-18 VITALS — BP 120/80 | HR 72 | Ht 62.0 in | Wt 132.0 lb

## 2022-03-18 DIAGNOSIS — M6528 Calcific tendinitis, other site: Secondary | ICD-10-CM

## 2022-03-18 DIAGNOSIS — M79671 Pain in right foot: Secondary | ICD-10-CM

## 2022-03-18 DIAGNOSIS — M25571 Pain in right ankle and joints of right foot: Secondary | ICD-10-CM

## 2022-03-18 DIAGNOSIS — M7731 Calcaneal spur, right foot: Secondary | ICD-10-CM | POA: Diagnosis not present

## 2022-03-18 DIAGNOSIS — Z01818 Encounter for other preprocedural examination: Secondary | ICD-10-CM | POA: Diagnosis not present

## 2022-03-18 DIAGNOSIS — M19071 Primary osteoarthritis, right ankle and foot: Secondary | ICD-10-CM | POA: Diagnosis not present

## 2022-03-18 LAB — BASIC METABOLIC PANEL
Anion gap: 8 (ref 5–15)
BUN: 22 mg/dL (ref 8–23)
CO2: 30 mmol/L (ref 22–32)
Calcium: 9.6 mg/dL (ref 8.9–10.3)
Chloride: 107 mmol/L (ref 98–111)
Creatinine, Ser: 0.48 mg/dL (ref 0.44–1.00)
GFR, Estimated: >60 mL/min (ref >60)
Glucose, Bld: 134 mg/dL — ABNORMAL HIGH (ref 70–99)
Potassium: 4.2 mmol/L (ref 3.5–5.1)
Sodium: 145 mmol/L (ref 135–145)

## 2022-03-18 LAB — CBC
HCT: 43.8 % (ref 36.0–46.0)
Hemoglobin: 13.9 g/dL (ref 12.0–15.0)
MCH: 31.1 pg (ref 26.0–34.0)
MCHC: 31.7 g/dL (ref 30.0–36.0)
MCV: 98 fL (ref 80.0–100.0)
Platelets: 271 10*3/uL (ref 150–400)
RBC: 4.47 MIL/uL (ref 3.87–5.11)
RDW: 13 % (ref 11.5–15.5)
WBC: 9 10*3/uL (ref 4.0–10.5)
nRBC: 0 % (ref 0.0–0.2)

## 2022-03-18 LAB — SURGICAL PCR SCREEN
MRSA, PCR: NEGATIVE
Staphylococcus aureus: NEGATIVE

## 2022-03-18 NOTE — Progress Notes (Deleted)
   I, Peterson Lombard, LAT, ATC acting as a scribe for Lynne Leader, MD.  Sandra [name] is a 61 y.o. female who presents to [place] [facility] today for R foot pain. Pt was previously seen by Dr. Tamala Julian on 02/05/22 for R hand pain. Today, pt c/o R foot pain x /. Pt locates pain to   Of note, pt is scheduled for a R total hip replaced w/ Dr. Ninfa Linden on 03/29/22.  R foot swelling: Aggravates: Treatments tried:   Pertinent review of systems: ***  Relevant historical information: ***   Exam:  There were no vitals taken for this visit. General: Well Developed, well nourished, and in no acute distress.   MSK: ***    Lab and Radiology Results No results found for this or any previous visit (from the past 72 hour(s)). No results found.     Assessment and Plan: 61 y.o. female with ***   PDMP not reviewed this encounter. No orders of the defined types were placed in this encounter.  No orders of the defined types were placed in this encounter.    Discussed warning signs or symptoms. Please see discharge instructions. Patient expresses understanding.   ***

## 2022-03-18 NOTE — Progress Notes (Signed)
Benito Mccreedy D.[place] Phone: [phone]   Assessment and Plan:     1. Acute right ankle pain 2. Right foot pain 3. Calcific Achilles tendinitis 4. Primary osteoarthritis of right ankle  -Chronic with exacerbation, initial sports medicine visit - Acute flare of foot and ankle pain with underlying osteoarthritis of right ankle and Achilles calcific tendinitis.  Suspect flare is due to patient's altered gait mechanics with right hip pain and plan for total right hip replacement later this week - X-rays obtained in clinic.  My interpretation: Acute fracture or dislocation.  Calcific changes at distal Achilles tendon that did not correlate with TTP.  Cortical changes at medial and lateral malleolus. - Start Tylenol 500 to 1000 mg tablets 2-3 times a day for day-to-day pain relief - May continue gabapentin 300 to 600 mg nightly as needed - Start HEP for ankle - Discussed that NSAID and steroid use is not ideal with patient's past medical history and plan for surgery within the next week, so these treatment options were avoided today.  Pertinent previous records reviewed include none   Follow Up: As needed if no improvement or worsening of symptoms   Subjective:   I, Sandra [name], am serving as a Education administrator for Doctor Glennon Mac  Chief Complaint: right foot and ankle pain   HPI:   03/18/22 Patient is a 61 year old female complaining of right foot and ankle pain. Patient states that she worked Thursday and Friday morning when she woke up the ankle felt tight and swollen, she was on her feet all day Friday the ankle was tight no pain Saturday and then Saturday she was not able to put weight on it , she took some tylenol and was able to do her activities, sunday she was unable to bare weight she had to alter her gait , she had pain even with gabapentin and tylenol she is supposed to have a hip replacement  this week , no numbness or tingling no radiating pain   Relevant Historical Information: DM type II, Roux-en-Y gastric bypass 2015, planned for right hip replacement later this week  Additional pertinent review of systems negative.   Current Outpatient Medications:    acetaminophen (TYLENOL) 500 MG tablet, Take 500 mg by mouth every 6 (six) hours as needed for moderate pain., Disp: , Rfl:    COLLAGEN PO, Take 1 Scoop by mouth daily., Disp: , Rfl:    gabapentin (NEURONTIN) 300 MG capsule, Take 1 capsule by mouth 2 times daily., Disp: 180 capsule, Rfl: 0   glucose blood (FREESTYLE LITE) test strip, Use daily as instructed., Disp: 100 each, Rfl: 12   hydrocortisone (ANUSOL-HC) 25 MG suppository, Insert rectally 2 times a day as needed, Disp: 12 suppository, Rfl: 1   Lancets (FREESTYLE) lancets, Use 1 lancet up to 4 times daily to check blood glucose as directed., Disp: 100 each, Rfl: 0   MELATONIN PO, Take 1 tablet by mouth at bedtime as needed (sleep)., Disp: , Rfl:    Multiple Vitamins-Minerals (MULTIVITAMIN PO), Take 2 tablets by mouth daily. , Disp: , Rfl:    omeprazole (PRILOSEC) 40 MG capsule, Take 1 capsule by mouth 2 times daily before a meal. Open capsule and sprinkle in water, other liquid or applesauce (Patient taking differently: Take 40 mg by mouth at bedtime.), Disp: 180 capsule, Rfl: 1   ondansetron (ZOFRAN-ODT) 4 MG disintegrating tablet, DISSOLVE 1 TABLET BY MOUTH EVERY  8 HOURS AS NEEDED FOR NAUSEA OR VOMITING (MAY TAKE 2 TABLETS IF 1 TABLET IS INEFFECTIVE), Disp: 30 tablet, Rfl: 3   polyethylene glycol powder (MIRALAX) 17 GM/SCOOP powder, Take 17 g by mouth daily. (Patient taking differently: Take 17 g by mouth daily as needed for moderate constipation.), Disp: 238 g, Rfl: 0   Objective:     Vitals:   03/18/22 1522  BP: 120/80  Pulse: 72  SpO2: 98%  Weight: 132 lb (59.9 kg)  Height: 5' 2"  (1.575 m)      Body mass index is 24.14 kg/m.    Physical Exam:    Gen:  Appears well, nad, nontoxic and pleasant Psych: Alert and oriented, appropriate mood and affect Neuro: sensation intact, strength is 5/5 with df/pf/inv/ev, muscle tone wnl Skin: no susupicious lesions or rashes  Right foot/ankle: no deformity, no swelling or effusion TTP deltoid, posterior to medial malleolus NTTP over fibular head, lat mal, medial mal, achilles, navicular, base of 5th, ATFL, CFL,  , calcaneous or midfoot ROM DF 30, PF 45, inv/ev intact Negative ant drawer, talar tilt, rotation test, squeeze test. Neg thompson No pain with resisted inversion or eversion    Electronically signed by:  Benito Mccreedy D.[facility] 3:57 PM 03/18/22

## 2022-03-18 NOTE — Patient Instructions (Addendum)
Good to see you  Tylenol [phone] mg 2-3 times a day for pain relief  Gabapentin 300-600 mg at night as needed  As needed follow up

## 2022-03-19 ENCOUNTER — Ambulatory Visit: Payer: 59 | Admitting: Family Medicine

## 2022-03-19 LAB — HEMOGLOBIN A1C
Hgb A1c MFr Bld: 5.8 % — ABNORMAL HIGH (ref 4.8–5.6)
Mean Plasma Glucose: 120 mg/dL

## 2022-03-19 NOTE — Progress Notes (Signed)
Anesthesia Chart Review   Case: [id number] Date/Time: 03/29/22 0945   Procedure: RIGHT TOTAL HIP ARTHROPLASTY ANTERIOR APPROACH (Right: Hip)   Anesthesia type: Spinal   Pre-op diagnosis: OSTEOARTHRITIS RIGHT HIP   Location: [place] 09 / [facility]   Surgeons: Mcarthur Rossetti, MD       DISCUSSION:61 y.o. never smoker with h/o HTN, Nonobstructive CAD by coronary CTA-2013, s/p gastric bypass, right hip OA scheduled for above procedure 03/29/2022 with Dr. Jean Rosenthal.   Per anesthesia note in 2016, "(hx of difficult airway but no records of that event, prior intubation here at [facility] long demonstrates "easy mask with oral airway" "unable to visulize vocal cords with MAC 4" "grade 1 view with glidescope")"  No complications noted with procedure 05/2021.  Per note glidescope used.  VS: BP 127/75   Pulse 67   Temp 37.2 C (Oral)   Resp 16   Ht _0  (1.575 m)   Wt 59.9 kg   SpO2 98%   BMI 24.14 kg/m   PROVIDERS: [name], MD is PCP    LABS: Labs reviewed: Acceptable for surgery. (all labs ordered are listed, but only abnormal results are displayed)  Labs Reviewed  HEMOGLOBIN A1C - Abnormal; Notable for the following components:      Result Value   Hgb A1c MFr Bld 5.8 (*)    All other components within normal limits  BASIC METABOLIC PANEL - Abnormal; Notable for the following components:   Glucose, Bld 134 (*)    All other components within normal limits  SURGICAL PCR SCREEN  CBC     IMAGES:   EKG:   CV:  Past Medical History:  Diagnosis Date   Anxiety    Arthritis    CAD (coronary artery disease)    nonobstructive CAD in LAD per 2013 CTA coronary morph   Cancer ([place])    hzx of uterine cancer   Cholelithiasis 04/06/2021   Depression    Diabetes mellitus    no meds taken now, checks cbg once or twice a week    Difficult intubation    Gallbladder abscess 05/02/2021   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   marginal ulcer   GERD (gastroesophageal  reflux disease)    H/O gastric bypass    Hemorrhoids, internal, with bleeding and Grade 2 prolapse 03/05/2013   All positions seen an anoscopy RP columns banded 03/05/2013 LL and RA banded 03/18/2013      History of endometrial cancer 2006 ---- S/P ABD. HYSTERECTOMY   STAGE I ENDOMETROID CARCINOMA   Hx of adenomatous colonic polyps 2011   BENIGN   Hyperlipidemia    no meds now   Hypertension    off meds since 2015 per pt    Past Surgical History:  Procedure Laterality Date   ABDOMINAL HYSTERECTOMY  12-20-2004   TAH   BILATERAL BREAST REDUCTION  11-16-1999   BIOPSY  02/20/2021   Procedure: BIOPSY;  Surgeon: Daryel November, MD;  Location: [facility] ENDOSCOPY;  Service: Gastroenterology;;   Rosanne Gutting PYLORI N/A 05/27/2013   Procedure: Lauris Chroman;  Surgeon: Shann Medal, MD;  Location: [facility];  Service: General;  Laterality: N/A;   [place] N/A 05/31/2021   Procedure: LAPAROSCOPIC LYSIS OF ADHESIONS, PARTIAL CHOLECYSTECTOMY, DRAIN PLACEMENT, WEDGE LIVER BIOPSY;  Surgeon: Michael Boston, MD;  [facility]: [facility];  Service: General;  Laterality: N/A;   COLONOSCOPY     COLONOSCOPY N/A 04/25/2014   Procedure: COLONOSCOPY;  Surgeon: Gatha Mayer, MD;  Location: [facility];  Service: Endoscopy;  Laterality: N/A;   COLONOSCOPY WITH PROPOFOL N/A 07/26/2019   Procedure: COLONOSCOPY WITH PROPOFOL;  Surgeon: Gatha Mayer, MD;  [facility];  Service: Endoscopy;  Laterality: N/A;   CYSTOSCOPY  09/06/2011   Procedure: CYSTOSCOPY;  Surgeon: Ailene Rud, MD;  Location: [facility];  Service: Urology;  Laterality: N/A;  LYNX SLING    ESOPHAGOGASTRODUODENOSCOPY (EGD) WITH PROPOFOL N/A 09/27/2014   Procedure: ESOPHAGOGASTRODUODENOSCOPY (EGD) WITH PROPOFOL;  Surgeon: Gatha Mayer, MD;  [facility];  Service: Endoscopy;  Laterality: N/A;   ESOPHAGOGASTRODUODENOSCOPY (EGD) WITH PROPOFOL N/A 03/11/2016    Procedure: ESOPHAGOGASTRODUODENOSCOPY (EGD) WITH PROPOFOL;  Surgeon: Jerene Bears, MD;  [facility];  Service: Endoscopy;  Laterality: N/A;   ESOPHAGOGASTRODUODENOSCOPY (EGD) WITH PROPOFOL Left 02/20/2021   Procedure: ESOPHAGOGASTRODUODENOSCOPY (EGD) WITH PROPOFOL;  Surgeon: Daryel November, MD;  Location: [place];  Service: Gastroenterology;  Laterality: Left;   GASTRIC BYPASS  10/04/2013   GASTRIC ROUX-EN-Y N/A 10/04/2013   Procedure: LAPAROSCOPIC ROUX-EN-Y GASTRIC BYPASS WITH UPPER ENDOSCOPY;  Surgeon: Shann Medal, MD;  [facility];  Service: General;  Laterality: N/A;   HEMORRHOID BANDING  02/2013   IR CHOLANGIOGRAM EXISTING TUBE  05/01/2021   IR PERC CHOLECYSTOSTOMY  03/06/2021   IR RADIOLOGIST EVAL & MGMT  04/12/2021   POLYPECTOMY     POLYPECTOMY  07/26/2019   Procedure: POLYPECTOMY;  Surgeon: Gatha Mayer, MD;  [facility];  Service: Endoscopy;;   PUBOVAGINAL SLING  09/06/2011   Procedure: Gaynelle Arabian;  Surgeon: Ailene Rud, MD;  Location: [facility];  Service: Urology;  Laterality: N/A;   REDUCTION MAMMAPLASTY Bilateral 2001   SHOULDER ARTHROSCOPY Right 10/23/2017   Procedure: RIGHT SHOULDER ARTHROSCOPIC ROTATOR CUFF REPAIR VS. DEBRIDEMENT, SUBACROMIAL DECOMPARESSION, DISTAL CLAVICAL EXCISION;  Surgeon: Tania Ade, MD;  Location: [place];  Service: Orthopedics;  Laterality: Right;   UVULOPALATOPHARYNGOPLASTY, TONSILLECTOMY AND SEPTOPLASTY  1994    MEDICATIONS:  acetaminophen (TYLENOL) 500 MG tablet   COLLAGEN PO   gabapentin (NEURONTIN) 300 MG capsule   glucose blood (FREESTYLE LITE) test strip   hydrocortisone (ANUSOL-HC) 25 MG suppository   Lancets (FREESTYLE) lancets   MELATONIN PO   Multiple Vitamins-Minerals (MULTIVITAMIN PO)   omeprazole (PRILOSEC) 40 MG capsule   ondansetron (ZOFRAN-ODT) 4 MG disintegrating tablet   polyethylene glycol powder (MIRALAX) 17 GM/SCOOP powder   No current  facility-administered medications for this encounter.   Konrad Felix Ward, PA-C [facility] [phone]

## 2022-03-19 NOTE — Progress Notes (Signed)
No orders in epic at time of preop appt on 03/18/22.  Need orders in epic.  Thanks.

## 2022-03-20 ENCOUNTER — Other Ambulatory Visit: Payer: Self-pay | Admitting: Physician Assistant

## 2022-03-20 DIAGNOSIS — M1611 Unilateral primary osteoarthritis, right hip: Secondary | ICD-10-CM

## 2022-03-28 NOTE — H&P (Signed)
TOTAL HIP ADMISSION H&P  Patient is admitted for right total hip arthroplasty.  Subjective:  Chief Complaint: right hip pain  HPI: Sandra [name], 61 y.o. female, has a history of pain and functional disability in the right hip(s) due to arthritis and patient has failed non-surgical conservative treatments for greater than 12 weeks to include NSAID's and/or analgesics, corticosteriod injections, flexibility and strengthening excercises, and activity modification.  Onset of symptoms was gradual starting 4 years ago with rapidlly worsening course since that time.The patient noted no past surgery on the right hip(s).  Patient currently rates pain in the right hip at 10 out of 10 with activity. Patient has night pain, worsening of pain with activity and weight bearing, pain that interfers with activities of daily living, and pain with passive range of motion. Patient has evidence of subchondral sclerosis, periarticular osteophytes, joint space narrowing, and significant labral tearing on MRI  by imaging studies. This condition presents safety issues increasing the risk of falls.  There is no current active infection.  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 09/26/2021   Tear of LCL (lateral collateral ligament) of knee, left, initial encounter 07/11/2021   Hypophosphatemia 06/03/2021   Perforation of gallbladder in cholecystitis s/p subtotal cholecystectomy 05/31/2021 05/31/2021   Gallbladder abscess 05/02/2021   Abscess 04/06/2021   Intra-abdominal abscess ([place]) 03/06/2021   Normocytic anemia 03/06/2021   Hypokalemia 03/06/2021   Hyponatremia 03/06/2021   Thrombocytosis 03/06/2021   Moderate protein-calorie malnutrition ([place]) 03/06/2021   Abdominal pain 02/17/2021   Right carpal tunnel syndrome 11/10/2020   Labral tear of  right hip joint 11/10/2020   Chronic right hip pain 04/10/2020   Nonallopathic lesion of sacral region 08/05/2019   Greater trochanteric bursitis, right 08/05/2019   Benign neoplasm of descending colon    Low back pain 05/27/2019   Hematoma of right hip 05/07/2019   Trigger finger, right index finger 02/17/2019   Anxiety 11/12/2018   Left lateral epicondylitis 05/14/2018   Left elbow pain 05/04/2018   Right rotator cuff tear 08/20/2017   Cervical disc disorder with radiculopathy of cervical region 04/25/2016   Nonallopathic lesion of cervical region 04/17/2016   Nonallopathic lesion of thoracic region 04/17/2016   Nonallopathic lesion of rib cage 04/17/2016   Nonallopathic lesion of lumbosacral region 04/17/2016   Degenerative cervical disc 03/29/2016   Degenerative arthritis of right knee 03/29/2016   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   Adjustment disorder with mixed anxiety and depressed mood 07/14/2014   Personal history of colonic polyps    History of Roux-en-Y gastric bypass, 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   Chronic constipation 03/05/2013   Coronary atherosclerosis of native coronary artery 08/27/2011   History of uterine cancer 08/15/2011   Type 2 diabetes mellitus ([place]) 08/15/2011   Hypercholesterolemia 08/15/2011   Past Medical History:  Diagnosis Date   Anxiety    Arthritis    CAD (coronary artery disease)    nonobstructive CAD in LAD per 2013 CTA coronary morph   Cancer ([place])    hzx of uterine cancer   Cholelithiasis 04/06/2021   Depression    Diabetes mellitus    no meds taken now, checks cbg once or twice a week    Difficult intubation    Gallbladder abscess 05/02/2021   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   marginal ulcer   GERD (gastroesophageal reflux disease)    H/O  gastric bypass    Hemorrhoids, internal, with bleeding and Grade 2 prolapse 03/05/2013   All positions seen an anoscopy RP columns banded 03/05/2013 LL and RA banded 03/18/2013      History of endometrial  cancer 2006 ---- S/P ABD. HYSTERECTOMY   STAGE I ENDOMETROID CARCINOMA   Hx of adenomatous colonic polyps 2011   BENIGN   Hyperlipidemia    no meds now   Hypertension    off meds since 2015 per pt    Past Surgical History:  Procedure Laterality Date   ABDOMINAL HYSTERECTOMY  12-20-2004   TAH   BILATERAL BREAST REDUCTION  11-16-1999   BIOPSY  02/20/2021   Procedure: BIOPSY;  Surgeon: Daryel November, MD;  Location: [facility] ENDOSCOPY;  Service: Gastroenterology;;   Rosanne Gutting PYLORI N/A 05/27/2013   Procedure: Lauris Chroman;  Surgeon: Shann Medal, MD;  Location: [facility];  Service: General;  Laterality: N/A;   [place] N/A 05/31/2021   Procedure: LAPAROSCOPIC LYSIS OF ADHESIONS, PARTIAL CHOLECYSTECTOMY, DRAIN PLACEMENT, WEDGE LIVER BIOPSY;  Surgeon: Michael Boston, MD;  [facility]: [facility];  Service: General;  Laterality: N/A;   COLONOSCOPY     COLONOSCOPY N/A 04/25/2014   Procedure: COLONOSCOPY;  Surgeon: Gatha Mayer, MD;  [facility];  Service: Endoscopy;  Laterality: N/A;   COLONOSCOPY WITH PROPOFOL N/A 07/26/2019   Procedure: COLONOSCOPY WITH PROPOFOL;  Surgeon: Gatha Mayer, MD;  [facility];  Service: Endoscopy;  Laterality: N/A;   CYSTOSCOPY  09/06/2011   Procedure: CYSTOSCOPY;  Surgeon: Ailene Rud, MD;  Location: [facility];  Service: Urology;  Laterality: N/A;  LYNX SLING    ESOPHAGOGASTRODUODENOSCOPY (EGD) WITH PROPOFOL N/A 09/27/2014   Procedure: ESOPHAGOGASTRODUODENOSCOPY (EGD) WITH PROPOFOL;  Surgeon: Gatha Mayer, MD;  [facility];  Service: Endoscopy;  Laterality: N/A;   ESOPHAGOGASTRODUODENOSCOPY (EGD) WITH PROPOFOL N/A 03/11/2016   Procedure: ESOPHAGOGASTRODUODENOSCOPY (EGD) WITH PROPOFOL;  Surgeon: Jerene Bears, MD;  [facility];  Service: Endoscopy;  Laterality: N/A;   ESOPHAGOGASTRODUODENOSCOPY (EGD) WITH PROPOFOL Left 02/20/2021   Procedure:  ESOPHAGOGASTRODUODENOSCOPY (EGD) WITH PROPOFOL;  Surgeon: Daryel November, MD;  Location: [place];  Service: Gastroenterology;  Laterality: Left;   GASTRIC BYPASS  10/04/2013   GASTRIC ROUX-EN-Y N/A 10/04/2013   Procedure: LAPAROSCOPIC ROUX-EN-Y GASTRIC BYPASS WITH UPPER ENDOSCOPY;  Surgeon: Shann Medal, MD;  [facility];  Service: General;  Laterality: N/A;   HEMORRHOID BANDING  02/2013   IR CHOLANGIOGRAM EXISTING TUBE  05/01/2021   IR PERC CHOLECYSTOSTOMY  03/06/2021   IR RADIOLOGIST EVAL & MGMT  04/12/2021   POLYPECTOMY     POLYPECTOMY  07/26/2019   Procedure: POLYPECTOMY;  Surgeon: Gatha Mayer, MD;  [facility];  Service: Endoscopy;;   PUBOVAGINAL SLING  09/06/2011   Procedure: Gaynelle Arabian;  Surgeon: Ailene Rud, MD;  Location: [facility];  Service: Urology;  Laterality: N/A;   REDUCTION MAMMAPLASTY Bilateral 2001   SHOULDER ARTHROSCOPY Right 10/23/2017   Procedure: RIGHT SHOULDER ARTHROSCOPIC ROTATOR CUFF REPAIR VS. DEBRIDEMENT, SUBACROMIAL DECOMPARESSION, DISTAL CLAVICAL EXCISION;  Surgeon: Tania Ade, MD;  Location: [place];  Service: Orthopedics;  Laterality: Right;   UVULOPALATOPHARYNGOPLASTY, TONSILLECTOMY AND SEPTOPLASTY  1994    No current facility-administered medications for this encounter.   Current Outpatient Medications  Medication Sig Dispense Refill Last Dose   acetaminophen (TYLENOL) 500 MG tablet Take 500 mg by mouth every 6 (six) hours as  needed for moderate pain.      COLLAGEN PO Take 1 Scoop by mouth daily.      gabapentin (NEURONTIN) 300 MG capsule Take 1 capsule by mouth 2 times daily. 180 capsule 0    hydrocortisone (ANUSOL-HC) 25 MG suppository Insert rectally 2 times a day as needed 12 suppository 1    MELATONIN PO Take 1 tablet by mouth at bedtime as needed (sleep).      Multiple Vitamins-Minerals (MULTIVITAMIN PO) Take 2 tablets by mouth daily.       omeprazole (PRILOSEC) 40 MG capsule Take 1  capsule by mouth 2 times daily before a meal. Open capsule and sprinkle in water, other liquid or applesauce (Patient taking differently: Take 40 mg by mouth at bedtime.) 180 capsule 1    ondansetron (ZOFRAN-ODT) 4 MG disintegrating tablet DISSOLVE 1 TABLET BY MOUTH EVERY 8 HOURS AS NEEDED FOR NAUSEA OR VOMITING (MAY TAKE 2 TABLETS IF 1 TABLET IS INEFFECTIVE) 30 tablet 3    polyethylene glycol powder (MIRALAX) 17 GM/SCOOP powder Take 17 g by mouth daily. (Patient taking differently: Take 17 g by mouth daily as needed for moderate constipation.) 238 g 0    glucose blood (FREESTYLE LITE) test strip Use daily as instructed. 100 each 12    Lancets (FREESTYLE) lancets Use 1 lancet up to 4 times daily to check blood glucose as directed. 100 each 0    Allergies  Allergen Reactions   Nsaids     History ulcers.  H/o gastric bypass   Statins     Muscle aches    Social History   Tobacco Use   Smoking status: Never   Smokeless tobacco: Never  Substance Use Topics   Alcohol use: No    Alcohol/week: 0.0 standard drinks of alcohol    Family History  Adopted: Yes     Review of Systems  Objective:  Physical Exam Vitals reviewed.  Constitutional:      Appearance: Normal appearance. She is normal weight.  HENT:     Head: Normocephalic and atraumatic.  Eyes:     Extraocular Movements: Extraocular movements intact.     Pupils: Pupils are equal, round, and reactive to light.  Cardiovascular:     Rate and Rhythm: Normal rate and regular rhythm.     Pulses: Normal pulses.  Pulmonary:     Effort: Pulmonary effort is normal.     Breath sounds: Normal breath sounds.  Abdominal:     Palpations: Abdomen is soft.  Musculoskeletal:     Cervical back: Normal range of motion and neck supple.     Right hip: Tenderness and bony tenderness present. Decreased strength.  Neurological:     Mental Status: She is alert and oriented to person, place, and time.  Psychiatric:        Behavior: Behavior  normal.     Vital signs in last 24 hours:    Labs:   Estimated body mass index is 24.14 kg/m as calculated from the following:   Height as of 03/18/22: 5' 2"  (1.575 m).   Weight as of 03/18/22: 59.9 kg.   Imaging Review Plain radiographs demonstrate moderate degenerative joint disease of the right hip(s). The bone quality appears to be excellent for age and reported activity level.      Assessment/Plan:  Degenerative arthritis, right hip(s) with significant labral tearing  The patient history, physical examination, clinical judgement of the provider and imaging studies are consistent with end stage degenerative joint disease of the right  hip(s) and total hip arthroplasty is deemed medically necessary. The treatment options including medical management, injection therapy, arthroscopy and arthroplasty were discussed at length. The risks and benefits of total hip arthroplasty were presented and reviewed. The risks due to aseptic loosening, infection, stiffness, dislocation/subluxation,  thromboembolic complications and other imponderables were discussed.  The patient acknowledged the explanation, agreed to proceed with the plan and consent was signed. Patient is being admitted for inpatient treatment for surgery, pain control, PT, OT, prophylactic antibiotics, VTE prophylaxis, progressive ambulation and ADL's and discharge planning.The patient is planning to be discharged home with home health services

## 2022-03-29 ENCOUNTER — Observation Stay (HOSPITAL_COMMUNITY)
Admission: RE | Admit: 2022-03-29 | Discharge: 2022-03-30 | Disposition: A | Discharge status: Home Health | Payer: 59 | Attending: Orthopaedic Surgery | Admitting: Orthopaedic Surgery

## 2022-03-29 ENCOUNTER — Encounter (HOSPITAL_COMMUNITY)
Admission: RE | Disposition: A | Discharge status: Home Health | Payer: Self-pay | Source: Home / Self Care | Attending: Orthopaedic Surgery

## 2022-03-29 ENCOUNTER — Encounter (HOSPITAL_COMMUNITY): Payer: Self-pay | Admitting: Orthopaedic Surgery

## 2022-03-29 ENCOUNTER — Other Ambulatory Visit: Payer: Self-pay

## 2022-03-29 ENCOUNTER — Ambulatory Visit (HOSPITAL_COMMUNITY): Payer: 59 | Admitting: Physician Assistant

## 2022-03-29 ENCOUNTER — Observation Stay (HOSPITAL_COMMUNITY): Payer: 59

## 2022-03-29 ENCOUNTER — Ambulatory Visit (HOSPITAL_BASED_OUTPATIENT_CLINIC_OR_DEPARTMENT_OTHER): Payer: 59 | Admitting: Anesthesiology

## 2022-03-29 ENCOUNTER — Ambulatory Visit (HOSPITAL_COMMUNITY): Payer: 59

## 2022-03-29 DIAGNOSIS — M1611 Unilateral primary osteoarthritis, right hip: Secondary | ICD-10-CM

## 2022-03-29 DIAGNOSIS — Z471 Aftercare following joint replacement surgery: Secondary | ICD-10-CM | POA: Diagnosis not present

## 2022-03-29 DIAGNOSIS — Z8541 Personal history of malignant neoplasm of cervix uteri: Secondary | ICD-10-CM | POA: Diagnosis not present

## 2022-03-29 DIAGNOSIS — I2510 Atherosclerotic heart disease of native coronary artery without angina pectoris: Secondary | ICD-10-CM | POA: Insufficient documentation

## 2022-03-29 DIAGNOSIS — Z79899 Other long term (current) drug therapy: Secondary | ICD-10-CM | POA: Diagnosis not present

## 2022-03-29 DIAGNOSIS — I10 Essential (primary) hypertension: Secondary | ICD-10-CM | POA: Diagnosis not present

## 2022-03-29 DIAGNOSIS — E119 Type 2 diabetes mellitus without complications: Secondary | ICD-10-CM | POA: Insufficient documentation

## 2022-03-29 DIAGNOSIS — Z96641 Presence of right artificial hip joint: Secondary | ICD-10-CM

## 2022-03-29 DIAGNOSIS — Z01818 Encounter for other preprocedural examination: Secondary | ICD-10-CM

## 2022-03-29 HISTORY — PX: TOTAL HIP ARTHROPLASTY: SHX124

## 2022-03-29 LAB — TYPE AND SCREEN
ABO/RH(D): O POS
Antibody Screen: NEGATIVE

## 2022-03-29 SURGERY — ARTHROPLASTY, HIP, TOTAL, ANTERIOR APPROACH
Anesthesia: Spinal | Site: Hip | Laterality: Right

## 2022-03-29 MED ORDER — PHENOL 1.4 % MT LIQD: 1.0000 | OROMUCOSAL | Status: DC | PRN

## 2022-03-29 MED ORDER — OXYCODONE HCL 5 MG PO TABS
5.0000 mg | ORAL_TABLET | Freq: Once | ORAL | Status: AC | PRN
  Administered 2022-03-29: 5 mg via ORAL

## 2022-03-29 MED ORDER — PROPOFOL 500 MG/50ML IV EMUL
INTRAVENOUS | Status: DC | PRN
  Administered 2022-03-29: 100 ug/kg/min via INTRAVENOUS

## 2022-03-29 MED ORDER — LIDOCAINE 2% (20 MG/ML) 5 ML SYRINGE
INTRAMUSCULAR | Status: DC | PRN
  Administered 2022-03-29: 40 mg via INTRAVENOUS

## 2022-03-29 MED ORDER — PHENYLEPHRINE HCL (PRESSORS) 10 MG/ML IV SOLN
INTRAVENOUS | Status: AC
  Filled 2022-03-29: qty 1

## 2022-03-29 MED ORDER — METHOCARBAMOL 500 MG PO TABS
500.0000 mg | ORAL_TABLET | Freq: Four times a day (QID) | ORAL | Status: DC | PRN
  Administered 2022-03-29 – 2022-03-30 (×2): 500 mg via ORAL
  Filled 2022-03-29 (×2): qty 1

## 2022-03-29 MED ORDER — SODIUM CHLORIDE 0.9 % IR SOLN
Status: DC | PRN
  Administered 2022-03-29: 1000 mL

## 2022-03-29 MED ORDER — 0.9 % SODIUM CHLORIDE (POUR BTL) OPTIME
TOPICAL | Status: DC | PRN
  Administered 2022-03-29: 1000 mL

## 2022-03-29 MED ORDER — OXYCODONE HCL 5 MG/5ML PO SOLN: 5.0000 mg | Freq: Once | ORAL | Status: AC | PRN

## 2022-03-29 MED ORDER — METOCLOPRAMIDE HCL 5 MG/ML IJ SOLN: 5.0000 mg | Freq: Three times a day (TID) | INTRAMUSCULAR | Status: DC | PRN

## 2022-03-29 MED ORDER — SODIUM CHLORIDE 0.9 % IV SOLN: INTRAVENOUS | Status: DC

## 2022-03-29 MED ORDER — OXYCODONE HCL 5 MG PO TABS
10.0000 mg | ORAL_TABLET | ORAL | Status: DC | PRN
  Administered 2022-03-30 (×2): 15 mg via ORAL
  Administered 2022-03-30: 10 mg via ORAL
  Filled 2022-03-29 (×2): qty 2
  Filled 2022-03-29 (×2): qty 3

## 2022-03-29 MED ORDER — ONDANSETRON HCL 4 MG/2ML IJ SOLN: 4.0000 mg | Freq: Once | INTRAMUSCULAR | Status: DC | PRN

## 2022-03-29 MED ORDER — METOCLOPRAMIDE HCL 5 MG PO TABS: 5.0000 mg | ORAL_TABLET | Freq: Three times a day (TID) | ORAL | Status: DC | PRN

## 2022-03-29 MED ORDER — OXYCODONE HCL 5 MG PO TABS
5.0000 mg | ORAL_TABLET | ORAL | Status: DC | PRN
  Administered 2022-03-29 – 2022-03-30 (×3): 10 mg via ORAL
  Filled 2022-03-29 (×2): qty 2

## 2022-03-29 MED ORDER — ONDANSETRON HCL 4 MG/2ML IJ SOLN
INTRAMUSCULAR | Status: DC | PRN
  Administered 2022-03-29: 4 mg via INTRAVENOUS

## 2022-03-29 MED ORDER — FENTANYL CITRATE (PF) 250 MCG/5ML IJ SOLN
INTRAMUSCULAR | Status: DC | PRN
  Administered 2022-03-29: 50 ug via INTRAVENOUS

## 2022-03-29 MED ORDER — HYDROMORPHONE HCL 1 MG/ML IJ SOLN: 0.5000 mg | INTRAMUSCULAR | Status: DC | PRN

## 2022-03-29 MED ORDER — MIDAZOLAM HCL 2 MG/2ML IJ SOLN
INTRAMUSCULAR | Status: DC | PRN
  Administered 2022-03-29 (×2): 1 mg via INTRAVENOUS

## 2022-03-29 MED ORDER — ASPIRIN 81 MG PO CHEW
81.0000 mg | CHEWABLE_TABLET | Freq: Two times a day (BID) | ORAL | Status: DC
  Administered 2022-03-29 – 2022-03-30 (×2): 81 mg via ORAL
  Filled 2022-03-29 (×2): qty 1

## 2022-03-29 MED ORDER — HYDROMORPHONE HCL 1 MG/ML IJ SOLN: 0.2500 mg | INTRAMUSCULAR | Status: DC | PRN

## 2022-03-29 MED ORDER — DEXAMETHASONE SODIUM PHOSPHATE 10 MG/ML IJ SOLN
INTRAMUSCULAR | Status: DC | PRN
  Administered 2022-03-29: 10 mg via INTRAVENOUS

## 2022-03-29 MED ORDER — PROPOFOL 10 MG/ML IV BOLUS
INTRAVENOUS | Status: DC | PRN
  Administered 2022-03-29 (×2): 20 mg via INTRAVENOUS

## 2022-03-29 MED ORDER — METHOCARBAMOL 500 MG IVPB - SIMPLE MED: 500.0000 mg | Freq: Four times a day (QID) | INTRAVENOUS | Status: DC | PRN

## 2022-03-29 MED ORDER — CHLORHEXIDINE GLUCONATE 0.12 % MT SOLN
15.0000 mL | Freq: Once | OROMUCOSAL | Status: AC
  Administered 2022-03-29: 15 mL via OROMUCOSAL

## 2022-03-29 MED ORDER — STERILE WATER FOR IRRIGATION IR SOLN
Status: DC | PRN
  Administered 2022-03-29: 2000 mL

## 2022-03-29 MED ORDER — BUPIVACAINE IN DEXTROSE 0.75-8.25 % IT SOLN
INTRATHECAL | Status: DC | PRN
  Administered 2022-03-29: 1.8 mL via INTRATHECAL

## 2022-03-29 MED ORDER — ORAL CARE MOUTH RINSE: 15.0000 mL | Freq: Once | OROMUCOSAL | Status: AC

## 2022-03-29 MED ORDER — DOCUSATE SODIUM 100 MG PO CAPS
100.0000 mg | ORAL_CAPSULE | Freq: Two times a day (BID) | ORAL | Status: DC
  Administered 2022-03-29 – 2022-03-30 (×3): 100 mg via ORAL
  Filled 2022-03-29 (×3): qty 1

## 2022-03-29 MED ORDER — CEFAZOLIN SODIUM-DEXTROSE 1-4 GM/50ML-% IV SOLN
1.0000 g | Freq: Once | INTRAVENOUS | Status: AC
  Administered 2022-03-29: 1 g via INTRAVENOUS
  Filled 2022-03-29: qty 50

## 2022-03-29 MED ORDER — PHENYLEPHRINE HCL-NACL 20-0.9 MG/250ML-% IV SOLN
INTRAVENOUS | Status: DC | PRN
  Administered 2022-03-29: 25 ug/min via INTRAVENOUS

## 2022-03-29 MED ORDER — CEFAZOLIN SODIUM-DEXTROSE 1-4 GM/50ML-% IV SOLN
1.0000 g | Freq: Four times a day (QID) | INTRAVENOUS | Status: DC
  Administered 2022-03-29: 1 g via INTRAVENOUS
  Filled 2022-03-29 (×2): qty 50

## 2022-03-29 MED ORDER — LACTATED RINGERS IV SOLN
INTRAVENOUS | Status: DC
  Administered 2022-03-29: 1000 mL via INTRAVENOUS

## 2022-03-29 MED ORDER — ONDANSETRON HCL 4 MG PO TABS: 4.0000 mg | ORAL_TABLET | Freq: Four times a day (QID) | ORAL | Status: DC | PRN

## 2022-03-29 MED ORDER — ONDANSETRON HCL 4 MG/2ML IJ SOLN: 4.0000 mg | Freq: Four times a day (QID) | INTRAMUSCULAR | Status: DC | PRN

## 2022-03-29 MED ORDER — POVIDONE-IODINE 10 % EX SWAB
2.0000 | Freq: Once | CUTANEOUS | Status: AC
  Administered 2022-03-29: 2 via TOPICAL

## 2022-03-29 MED ORDER — ACETAMINOPHEN 325 MG PO TABS: 325.0000 mg | ORAL_TABLET | Freq: Four times a day (QID) | ORAL | Status: DC | PRN

## 2022-03-29 MED ORDER — DIPHENHYDRAMINE HCL 12.5 MG/5ML PO ELIX: 12.5000 mg | ORAL_SOLUTION | ORAL | Status: DC | PRN

## 2022-03-29 MED ORDER — FENTANYL CITRATE (PF) 250 MCG/5ML IJ SOLN
INTRAMUSCULAR | Status: AC
  Filled 2022-03-29: qty 5

## 2022-03-29 MED ORDER — PANTOPRAZOLE SODIUM 40 MG PO TBEC
40.0000 mg | DELAYED_RELEASE_TABLET | Freq: Every day | ORAL | Status: DC
  Administered 2022-03-29 – 2022-03-30 (×2): 40 mg via ORAL
  Filled 2022-03-29 (×2): qty 1

## 2022-03-29 MED ORDER — ALUM & MAG HYDROXIDE-SIMETH 200-200-20 MG/5ML PO SUSP: 30.0000 mL | ORAL | Status: DC | PRN

## 2022-03-29 MED ORDER — MIDAZOLAM HCL 2 MG/2ML IJ SOLN
INTRAMUSCULAR | Status: AC
  Filled 2022-03-29: qty 2

## 2022-03-29 MED ORDER — CEFAZOLIN SODIUM-DEXTROSE 2-4 GM/100ML-% IV SOLN
2.0000 g | INTRAVENOUS | Status: AC
  Administered 2022-03-29: 2 g via INTRAVENOUS
  Filled 2022-03-29: qty 100

## 2022-03-29 MED ORDER — OXYCODONE HCL 5 MG PO TABS
ORAL_TABLET | ORAL | Status: AC
  Filled 2022-03-29: qty 1

## 2022-03-29 MED ORDER — TRANEXAMIC ACID-NACL 1000-0.7 MG/100ML-% IV SOLN
1000.0000 mg | INTRAVENOUS | Status: AC
  Administered 2022-03-29: 1000 mg via INTRAVENOUS
  Filled 2022-03-29: qty 100

## 2022-03-29 MED ORDER — MENTHOL 3 MG MT LOZG: 1.0000 | LOZENGE | OROMUCOSAL | Status: DC | PRN

## 2022-03-29 MED ORDER — GABAPENTIN 300 MG PO CAPS
300.0000 mg | ORAL_CAPSULE | Freq: Two times a day (BID) | ORAL | Status: DC
  Administered 2022-03-29 – 2022-03-30 (×3): 300 mg via ORAL
  Filled 2022-03-29 (×3): qty 1

## 2022-03-29 SURGICAL SUPPLY — 35 items
BAG COUNTER SPONGE SURGICOUNT (BAG) ×1 IMPLANT
BAG ZIPLOCK 12X15 (MISCELLANEOUS) IMPLANT
BLADE SAW SGTL 18X1.27X75 (BLADE) ×1 IMPLANT
COVER PERINEAL POST (MISCELLANEOUS) ×1 IMPLANT
COVER SURGICAL LIGHT HANDLE (MISCELLANEOUS) ×1 IMPLANT
CUP SECTOR GRIPTON 50MM (Cup) IMPLANT
DRAPE FOOT SWITCH (DRAPES) ×1 IMPLANT
DRAPE STERI IOBAN 125X83 (DRAPES) ×1 IMPLANT
DRAPE U-SHAPE 47X51 STRL (DRAPES) ×2 IMPLANT
DRSG AQUACEL AG ADV 3.5X10 (GAUZE/BANDAGES/DRESSINGS) ×1 IMPLANT
DURAPREP 26ML APPLICATOR (WOUND CARE) ×1 IMPLANT
ELECT REM PT RETURN 15FT ADLT (MISCELLANEOUS) ×1 IMPLANT
GAUZE XEROFORM 1X8 LF (GAUZE/BANDAGES/DRESSINGS) ×1 IMPLANT
GLOVE BIO SURGEON STRL SZ7.5 (GLOVE) ×1 IMPLANT
GLOVE BIOGEL PI IND STRL 8 (GLOVE) ×2 IMPLANT
GLOVE ECLIPSE 8.0 STRL XLNG CF (GLOVE) ×1 IMPLANT
GOWN STRL REUS W/ TWL XL LVL3 (GOWN DISPOSABLE) ×2 IMPLANT
GOWN STRL REUS W/TWL XL LVL3 (GOWN DISPOSABLE) ×2
HANDPIECE INTERPULSE COAX TIP (DISPOSABLE) ×1
HEAD FEMORAL 32 CERAMIC (Hips) IMPLANT
HOLDER FOLEY CATH W/STRAP (MISCELLANEOUS) ×1 IMPLANT
KIT TURNOVER KIT A (KITS) IMPLANT
LINER ACETABULAR 32X50 (Liner) IMPLANT
PACK ANTERIOR HIP CUSTOM (KITS) ×1 IMPLANT
SET HNDPC FAN SPRY TIP SCT (DISPOSABLE) ×1 IMPLANT
STAPLER VISISTAT 35W (STAPLE) IMPLANT
STEM FEM ACTIS STD SZ4 (Stem) IMPLANT
SUT ETHILON 2 0 PS N (SUTURE) IMPLANT
SUT MNCRL AB 4-0 PS2 18 (SUTURE) IMPLANT
SUT VIC AB 0 CT1 36 (SUTURE) ×1 IMPLANT
SUT VIC AB 1 CT1 36 (SUTURE) ×1 IMPLANT
SUT VIC AB 2-0 CT1 27 (SUTURE) ×2
SUT VIC AB 2-0 CT1 TAPERPNT 27 (SUTURE) ×2 IMPLANT
TRAY FOLEY MTR SLVR 16FR STAT (SET/KITS/TRAYS/PACK) IMPLANT
YANKAUER SUCT BULB TIP NO VENT (SUCTIONS) ×1 IMPLANT

## 2022-03-29 NOTE — Transfer of Care (Signed)
Immediate Anesthesia Transfer of Care Note  Patient: Sandra [name]  Procedure(s) Performed: RIGHT TOTAL HIP ARTHROPLASTY ANTERIOR APPROACH (Right: Hip)  Patient Location: PACU  Anesthesia Type:Spinal  Level of Consciousness: awake  Airway & Oxygen Therapy: Patient Spontanous Breathing and Patient connected to face mask oxygen  Post-op Assessment: Report given to RN and Post -op Vital signs reviewed and stable  Post vital signs: Reviewed and stable  Last Vitals:  Vitals Value Taken Time  BP 111/66 03/29/22 1018  Temp 36.7 C 03/29/22 1018  Pulse 64 03/29/22 1018  Resp 18 03/29/22 1018  SpO2 100 % 03/29/22 1018    Last Pain:  Vitals:   03/29/22 1018  TempSrc: Oral  PainSc:          Complications: No notable events documented.

## 2022-03-29 NOTE — Evaluation (Signed)
Physical Therapy Evaluation Patient Details Name: Sandra [name] MRN: [id number] DOB: 1960/12/27 Today's Date: 03/29/2022  History of Present Illness  Pt s/p R THR and with hx of CAD and DM  Clinical Impression  Pt s/p R THR and presents with decreased R LE strength/ROM and post op pain limiting functional mobility.  Pt should progress to dc home with family assist.     Recommendations for follow up therapy are one component of a multi-disciplinary discharge planning process, led by the attending physician.  Recommendations may be updated based on patient status, additional functional criteria and insurance authorization.  Follow Up Recommendations Follow physician's recommendations for discharge plan and follow up therapies      Assistance Recommended at Discharge Intermittent Supervision/Assistance  Patient can return home with the following  A little help with walking and/or transfers;A little help with bathing/dressing/bathroom;Assistance with cooking/housework;Assist for transportation;Help with stairs or ramp for entrance    Equipment Recommendations Rolling walker (2 wheels)  Recommendations for Other Services       Functional Status Assessment Patient has had a recent decline in their functional status and demonstrates the ability to make significant improvements in function in a reasonable and predictable amount of time.     Precautions / Restrictions Precautions Precautions: Fall Restrictions Weight Bearing Restrictions: No Other Position/Activity Restrictions: WBAT      Mobility  Bed Mobility Overal bed mobility: Needs Assistance Bed Mobility: Supine to Sit     Supine to sit: Min assist     General bed mobility comments: cues for sequence and use of L LE to self assist    Transfers Overall transfer level: Needs assistance Equipment used: Rolling walker (2 wheels) Transfers: Sit to/from Stand Sit to Stand: Min assist, Min guard            General transfer comment: cues for LE management and use of UEs to self assist    Ambulation/Gait Ambulation/Gait assistance: Min assist, Min guard Gait Distance (Feet): 64 Feet Assistive device: Rolling walker (2 wheels) Gait Pattern/deviations: Step-to pattern, Decreased step length - right, Decreased step length - left, Shuffle, Trunk flexed Gait velocity: decr     General Gait Details: cues for sequence, posture and position from [employer]            Wheelchair Mobility    Modified Rankin (Stroke Patients Only)       Balance Overall balance assessment: Mild deficits observed, not formally tested                                           Pertinent Vitals/Pain Pain Assessment Pain Assessment: 0-10 Pain Score: 3  Pain Location: R hip Pain Descriptors / Indicators: Aching, Sore Pain Intervention(s): Limited activity within patient's tolerance, Monitored during session, Premedicated before session, Ice applied    Home Living Family/patient expects to be discharged to:: Private residence Living Arrangements: Other relatives Available Help at Discharge: Family Type of Home: House Home Access: Stairs to enter Entrance Stairs-Rails: Right;Left;Can reach both Technical brewer of Steps: 3   Home Layout: One level        Prior Function Prior Level of Function : Independent/Modified Independent                     Hand Dominance   Dominant Hand: Right    Extremity/Trunk Assessment   Upper Extremity Assessment Upper  Extremity Assessment: Overall WFL for tasks assessed    Lower Extremity Assessment Lower Extremity Assessment: RLE deficits/detail    Cervical / Trunk Assessment Cervical / Trunk Assessment: Normal  Communication   Communication: No difficulties  Cognition Arousal/Alertness: Awake/alert Behavior During Therapy: WFL for tasks assessed/performed Overall Cognitive Status: Within Functional Limits for tasks  assessed                                          General Comments      Exercises Total Joint Exercises Ankle Circles/Pumps: AROM, 15 reps, Supine, Both   Assessment/Plan    PT Assessment Patient needs continued PT services  PT Problem List Decreased strength;Decreased range of motion;Decreased activity tolerance;Decreased balance;Decreased mobility;Decreased knowledge of use of DME;Pain       PT Treatment Interventions Gait training;DME instruction;Stair training;Functional mobility training;Therapeutic activities;Therapeutic exercise;Patient/family education    PT Goals (Current goals can be found in the Care Plan section)  Acute Rehab PT Goals Patient Stated Goal: REgain IND PT Goal Formulation: With patient Time For Goal Achievement: 04/05/22 Potential to Achieve Goals: Good    Frequency 7X/week     Co-evaluation               AM-PAC PT "6 Clicks" Mobility  Outcome Measure Help needed turning from your back to your side while in a flat bed without using bedrails?: A Little Help needed moving from lying on your back to sitting on the side of a flat bed without using bedrails?: A Little Help needed moving to and from a bed to a chair (including a wheelchair)?: A Little Help needed standing up from a chair using your arms (e.g., wheelchair or bedside chair)?: A Little Help needed to walk in hospital room?: A Little Help needed climbing 3-5 steps with a railing? : A Lot 6 Click Score: 17    End of Session Equipment Utilized During Treatment: Gait belt Activity Tolerance: Patient tolerated treatment well Patient left: in chair;with call bell/phone within reach;with chair alarm set Nurse Communication: Mobility status PT Visit Diagnosis: Difficulty in walking, not elsewhere classified (R26.2)    Time: 2761-8485 PT Time Calculation (min) (ACUTE ONLY): 32 min   Charges:   PT Evaluation $PT Eval Low Complexity: 1 Low PT Treatments $Gait  Training: 8-22 mins        [place] Pager [phone] Office [phone]   Mohid Furuya 03/29/2022, 3:47 PM

## 2022-03-29 NOTE — Op Note (Signed)
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 Ninfa Linden, MD Assistant: Benita Stabile, PA-C  Anesthesia: Spinal Antibiotics: 2 g IV Ancef Blood loss: 803 cc Complications: None  Indications: The patient is a 61 year old female with debilitating right hip pain that is been getting worse for over a year now.  She has tried and failed all forms conservative treatments and x-rays and especially MRI was consistent with degenerative labral tearing as well as thinning of the articular cartilage in her right hip.  She has tried and failed all forms of conservative treatment at this point her right hip pain is daily and it is 10 out of 10.  It is detrimentally affecting her mobility, her quality of life and her activities day living to the point she does wish to proceed with hip replacement.  We talked in length in detail the risk of acute blood loss anemia, nerve vessel injury, fracture, infection, DVT, dislocation, implant failure, leg length differences and wound healing issues.  She understands her goals are hopefully decrease pain, improve mobility, and improve quality of life.  Procedure description: After informed consent was obtained and the appropriate right hip was marked, the patient was brought to the operating room and set up on her stretcher where spinal anesthesia was obtained.  She was then laid in supine position on stretcher and a Foley catheter was placed.  With her laying supine assessed her leg lengths and her leg lengths are equal in light of x-ray showing that she may be just a touch short on the right she is actually equal clinically.   Traction boots were then placed on both her feet and placed her supine on the Hana fracture table with a perineal post in place in both legs and inline skeletal traction devices but no traction applied.  We then assessed radiographically under fluoroscopic guidance preoperative to get a good preoperative x-ray.  The right operative hip was prepped and draped in DuraPrep and sterile drapes.  Timeout was called and she identified as correct patient correct right hip.  An incision was then made just inferior and posterior to the ASIS and carried slightly obliquely down the leg.  Dissection was carried down to the tensor fascia lata muscle and the tensor fascia was identified and divided longitudinally to proceed with directed approach to the hip.  Circumflex vessels were identified cauterized and the hip capsule identified.  It was opened up in L-type format finding a moderate joint effusion.  Cobra retractors were placed around the medial lateral femoral neck and a femoral neck cut was made with an oscillating saw just proximal to the lesser trochanter.  This was completed with an osteotome and is corkscrew graft placed in the femoral head.  The femoral head was removed in its entirety.  A bent Hohmann was then placed over the medial acetabular rim and to remove remnants of the asked her labrum and other debris.  Reaming was then initiated from a size 43 reamer and stepwise increments going up to a size 49 reamer with all reamers placed in direct visitation in the last reamer was also placed under direct fluoroscopy in order to obtain the depth of reaming, the inclination and anteversion.  The real DePuy sector  GRIPTION acetabular component size 50 was then placed under direct visitation and fluoroscopy and a 32+0 polythene liner was placed for that size 5 50 acetabular component.  Attention was then turned to the femur.  With the right leg externally rotated to 120 degrees, extended and brought underneath the other  leg, a Mueller retractor was placed medially and a Hohmann retractor was placed behind the greater trochanter.  A lateral joint capsule was released and a box cutting osteotome was used to enter the femoral canal.  Broaching was then initiated from a size 0 broach going up to a size 4.  With a size 4 in place we trialed a standard offset femoral neck based over anatomy and a 32+1 trial hip ball.  The leg was brought over and up and with traction and internal rotation reduced in the pelvis.  We then assessed it clinically and radiographically we are pleased with leg length, offset, range of motion and stability.  The hip was then dislocated and remove the trial components.  We placed the real Actis femoral component size 4 with standard offset and the real 32+1 ceramic head ball and again reduces in acetabulum and assessed it clinically and radiographically and we are pleased.  The joint then irrigated with normal saline solution.  The joint capsule was closed with interrupted #1 Ethibond suture followed by #1 Vicryl to close the tensor fascia.  0 Vicryl was used to close the deep tissue and 2-0 Vicryl used to close subcutaneous tissue.  Skin was closed with staples.  Aquacel dressing was applied.  She was taken off of the Hana table and taken recovery room in stable condition with all final counts being correct and no complications noted.  Benita Stabile, PA-C did assist in the entire case and his assistance was crucial and medically necessary for helping soft tissue retraction and guide implant placement.  He was directly involved with a layered closure of the wound.

## 2022-03-29 NOTE — Interval H&P Note (Signed)
History and Physical Interval Note: Patient understands that she is here today for right hip replacement to treat her right hip osteoarthritis.  There has been no acute or interval change in her medical status.  See recent H&P.  The risks and benefits of surgery been explained in detail and informed consent is obtained.  The right operative hip has been marked.  03/29/2022 7:00 AM  Sandra [name]  has presented today for surgery, with the diagnosis of OSTEOARTHRITIS RIGHT HIP.  The various methods of treatment have been discussed with the patient and family. After consideration of risks, benefits and other options for treatment, the patient has consented to  Procedure(s): RIGHT TOTAL HIP ARTHROPLASTY ANTERIOR APPROACH (Right) as a surgical intervention.  The patient's history has been reviewed, patient examined, no change in status, stable for surgery.  I have reviewed the patient's chart and labs.  Questions were answered to the patient's satisfaction.     Mcarthur Rossetti

## 2022-03-29 NOTE — Anesthesia Procedure Notes (Signed)
Spinal  Patient location during procedure: OR Start time: 03/29/2022 7:15 AM End time: 03/29/2022 7:19 AM Reason for block: surgical anesthesia Staffing Performed: anesthesiologist  Anesthesiologist: Myrtie Soman, MD Performed by: Myrtie Soman, MD Authorized by: Myrtie Soman, MD   Preanesthetic Checklist Completed: patient identified, IV checked, site marked, risks and benefits discussed, surgical consent, monitors and equipment checked, pre-op evaluation and timeout performed Spinal Block Patient position: sitting Prep: Betadine Patient monitoring: heart rate, continuous pulse ox and blood pressure Approach: midline Location: L3-4 Injection technique: single-shot Needle Needle type: Sprotte  Needle gauge: 24 G Needle length: 9 cm Assessment Sensory level: T6 Events: CSF return Additional Notes

## 2022-03-29 NOTE — Anesthesia Preprocedure Evaluation (Signed)
Anesthesia Evaluation  Patient identified by MRN, date of birth, ID band Patient awake    Reviewed: Allergy & Precautions, H&P , NPO status , Patient's Chart, lab work & pertinent test results  Airway Mallampati: II  TM Distance: >3 FB Neck ROM: Full    Dental no notable dental hx.    Pulmonary neg pulmonary ROS   Pulmonary exam normal breath sounds clear to auscultation       Cardiovascular hypertension, Normal cardiovascular exam Rhythm:Regular Rate:Normal     Neuro/Psych negative neurological ROS  negative psych ROS   GI/Hepatic Neg liver ROS,GERD  ,,  Endo/Other  negative endocrine ROSdiabetes    Renal/GU negative Renal ROS  negative genitourinary   Musculoskeletal negative musculoskeletal ROS (+)    Abdominal   Peds negative pediatric ROS (+)  Hematology negative hematology ROS (+)   Anesthesia Other Findings   Reproductive/Obstetrics negative OB ROS                             Anesthesia Physical Anesthesia Plan  ASA: 2  Anesthesia Plan: Spinal   Post-op Pain Management:    Induction: Intravenous  PONV Risk Score and Plan: 2 and Ondansetron, Dexamethasone, Treatment may vary due to age or medical condition and Propofol infusion  Airway Management Planned: Simple Face Mask  Additional Equipment:   Intra-op Plan:   Post-operative Plan:   Informed Consent: I have reviewed the patients History and Physical, chart, labs and discussed the procedure including the risks, benefits and alternatives for the proposed anesthesia with the patient or authorized representative who has indicated his/her understanding and acceptance.     Dental advisory given  Plan Discussed with: CRNA and Surgeon  Anesthesia Plan Comments:        Anesthesia Quick Evaluation

## 2022-03-29 NOTE — Anesthesia Procedure Notes (Signed)
Date/Time: 03/29/2022 7:14 AM  Performed by: Cynda Familia, CRNAPre-anesthesia Checklist: Patient identified, Emergency Drugs available, Suction available, Patient being monitored and Timeout performed Patient Re-evaluated:Patient Re-evaluated prior to induction Oxygen Delivery Method: Simple face mask Placement Confirmation: breath sounds checked- equal and bilateral and positive ETCO2 Dental Injury: Teeth and Oropharynx as per pre-operative assessment

## 2022-03-29 NOTE — Anesthesia Postprocedure Evaluation (Signed)
Anesthesia Post Note  Patient: Sandra [name]  Procedure(s) Performed: RIGHT TOTAL HIP ARTHROPLASTY ANTERIOR APPROACH (Right: Hip)     Patient location during evaluation: PACU Anesthesia Type: Spinal Level of consciousness: oriented and awake and alert Pain management: pain level controlled Vital Signs Assessment: post-procedure vital signs reviewed and stable Respiratory status: spontaneous breathing, respiratory function stable and patient connected to nasal cannula oxygen Cardiovascular status: blood pressure returned to baseline and stable Postop Assessment: no headache, no backache and no apparent nausea or vomiting Anesthetic complications: no  No notable events documented.  Last Vitals:  Vitals:   03/29/22 1018 03/29/22 1227  BP: 111/66 114/64  Pulse: 64 71  Resp: 18 16  Temp: 36.7 C 36.6 C  SpO2: 100% 99%    Last Pain:  Vitals:   03/29/22 1227  TempSrc: Oral  PainSc:                  Maeleigh Buschman S

## 2022-03-29 NOTE — Plan of Care (Signed)
  Problem: Education: Goal: Knowledge of the prescribed therapeutic regimen will improve Outcome: Progressing   Problem: Activity: Goal: Ability to avoid complications of mobility impairment will improve Outcome: Progressing   Problem: Pain Management: Goal: Pain level will decrease with appropriate interventions Outcome: Progressing   Problem: Education: Goal: Knowledge of General Education information will improve Description: Including pain rating scale, medication(s)/side effects and non-pharmacologic comfort measures Outcome: Progressing   Problem: Activity: Goal: Risk for activity intolerance will decrease Outcome: Progressing   Problem: Nutrition: Goal: Adequate nutrition will be maintained Outcome: Progressing   Problem: Elimination: Goal: Will not experience complications related to bowel motility Outcome: Progressing   Problem: Pain Managment: Goal: General experience of comfort will improve Outcome: Progressing

## 2022-03-30 ENCOUNTER — Other Ambulatory Visit (HOSPITAL_COMMUNITY): Payer: Self-pay

## 2022-03-30 DIAGNOSIS — I2510 Atherosclerotic heart disease of native coronary artery without angina pectoris: Secondary | ICD-10-CM | POA: Diagnosis not present

## 2022-03-30 DIAGNOSIS — E119 Type 2 diabetes mellitus without complications: Secondary | ICD-10-CM | POA: Diagnosis not present

## 2022-03-30 DIAGNOSIS — Z8541 Personal history of malignant neoplasm of cervix uteri: Secondary | ICD-10-CM | POA: Diagnosis not present

## 2022-03-30 DIAGNOSIS — M1611 Unilateral primary osteoarthritis, right hip: Secondary | ICD-10-CM | POA: Diagnosis not present

## 2022-03-30 DIAGNOSIS — M8580 Other specified disorders of bone density and structure, unspecified site: Secondary | ICD-10-CM | POA: Diagnosis not present

## 2022-03-30 DIAGNOSIS — R531 Weakness: Secondary | ICD-10-CM | POA: Diagnosis not present

## 2022-03-30 DIAGNOSIS — Z79899 Other long term (current) drug therapy: Secondary | ICD-10-CM | POA: Diagnosis not present

## 2022-03-30 DIAGNOSIS — I10 Essential (primary) hypertension: Secondary | ICD-10-CM | POA: Diagnosis not present

## 2022-03-30 LAB — CBC
HCT: 30.6 % — ABNORMAL LOW (ref 36.0–46.0)
Hemoglobin: 9.9 g/dL — ABNORMAL LOW (ref 12.0–15.0)
MCH: 31.8 pg (ref 26.0–34.0)
MCHC: 32.4 g/dL (ref 30.0–36.0)
MCV: 98.4 fL (ref 80.0–100.0)
Platelets: 172 10*3/uL (ref 150–400)
RBC: 3.11 MIL/uL — ABNORMAL LOW (ref 3.87–5.11)
RDW: 12.9 % (ref 11.5–15.5)
WBC: 11.8 10*3/uL — ABNORMAL HIGH (ref 4.0–10.5)
nRBC: 0 % (ref 0.0–0.2)

## 2022-03-30 LAB — BASIC METABOLIC PANEL
Anion gap: 4 — ABNORMAL LOW (ref 5–15)
BUN: 15 mg/dL (ref 8–23)
CO2: 28 mmol/L (ref 22–32)
Calcium: 8.4 mg/dL — ABNORMAL LOW (ref 8.9–10.3)
Chloride: 108 mmol/L (ref 98–111)
Creatinine, Ser: 0.55 mg/dL (ref 0.44–1.00)
GFR, Estimated: >60 mL/min (ref >60)
Glucose, Bld: 138 mg/dL — ABNORMAL HIGH (ref 70–99)
Potassium: 3.8 mmol/L (ref 3.5–5.1)
Sodium: 140 mmol/L (ref 135–145)

## 2022-03-30 MED ORDER — OXYCODONE HCL 5 MG PO TABS
5.0000 mg | ORAL_TABLET | ORAL | 0 refills | Status: DC | PRN
  Filled 2022-03-30: qty 30, 3d supply, fill #0

## 2022-03-30 MED ORDER — ASPIRIN 81 MG PO CHEW
81.0000 mg | CHEWABLE_TABLET | Freq: Two times a day (BID) | ORAL | 0 refills | Status: DC
  Filled 2022-03-30: qty 35, 18d supply, fill #0

## 2022-03-30 MED ORDER — METHOCARBAMOL 500 MG PO TABS
500.0000 mg | ORAL_TABLET | Freq: Four times a day (QID) | ORAL | 1 refills | Status: DC | PRN
  Filled 2022-03-30: qty 40, 10d supply, fill #0
  Filled 2022-04-12: qty 40, 10d supply, fill #1

## 2022-03-30 NOTE — Progress Notes (Signed)
Subjective: 1 Day Post-Op Procedure(s) (LRB): RIGHT TOTAL HIP ARTHROPLASTY ANTERIOR APPROACH (Right) Patient reports pain as moderate.  No complaints.   Objective: Vital signs in last 24 hours: Temp:  [97.7 F (36.5 C)-99.1 F (37.3 C)] 99 F (37.2 C) (12/02 0459) Pulse Rate:  [60-91] 86 (12/02 0459) Resp:  [13-18] 18 (12/02 0459) BP: (92-114)/(49-73) 96/59 (12/02 0459) SpO2:  [94 %-100 %] 95 % (12/02 0459)  Intake/Output from previous day: 12/01 0701 - 12/02 0700 In: 3557.1 [P.O.:120; I.V.:3137.1; IV Piggyback:300] Out: 2950 [Urine:2800; Blood:150] Intake/Output this shift: No intake/output data recorded.  Recent Labs    03/30/22 0342  HGB 9.9*   Recent Labs    03/30/22 0342  WBC 11.8*  RBC 3.11*  HCT 30.6*  PLT 172   Recent Labs    03/30/22 0342  NA 140  K 3.8  CL 108  CO2 28  BUN 15  CREATININE 0.55  GLUCOSE 138*  CALCIUM 8.4*   No results for input(s): "LABPT", "INR" in the last 72 hours.  Sensation intact distally Intact pulses distally Incision: dressing C/D/I Compartment soft   Assessment/Plan: 1 Day Post-Op Procedure(s) (LRB): RIGHT TOTAL HIP ARTHROPLASTY ANTERIOR APPROACH (Right) Up with therapy Discharge home with home health     Erskine Emery 03/30/2022, 8:26 AM

## 2022-03-30 NOTE — Discharge Summary (Signed)
Patient ID: Sandra [name] MRN: [id number] DOB/AGE: 1961-02-10 61 y.o.  Admit date: 03/29/2022 Discharge date: 03/30/2022  Admission Diagnoses:  Principal Problem:   Unilateral primary osteoarthritis, right hip Active Problems:   Status post total replacement of right hip   Discharge Diagnoses:  Same  Past Medical History:  Diagnosis Date   Anxiety    Arthritis    CAD (coronary artery disease)    nonobstructive CAD in LAD per 2013 CTA coronary morph   Cancer [facility])    hzx of uterine cancer   Cholelithiasis 04/06/2021   Depression    Diabetes mellitus    no meds taken now, checks cbg once or twice a week    Difficult intubation    Gallbladder abscess 05/02/2021   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   marginal ulcer   GERD (gastroesophageal reflux disease)    H/O gastric bypass    Hemorrhoids, internal, with bleeding and Grade 2 prolapse 03/05/2013   All positions seen an anoscopy RP columns banded 03/05/2013 LL and RA banded 03/18/2013      History of endometrial cancer 2006 ---- S/P ABD. HYSTERECTOMY   STAGE I ENDOMETROID CARCINOMA   Hx of adenomatous colonic polyps 2011   BENIGN   Hyperlipidemia    no meds now   Hypertension    off meds since 2015 per pt    Surgeries: Procedure(s): RIGHT TOTAL HIP ARTHROPLASTY ANTERIOR APPROACH on 03/29/2022   Consultants:   Discharged Condition: Improved  Hospital Course: Sandra [name] is an 61 y.o. female who was admitted 03/29/2022 for operative treatment ofUnilateral primary osteoarthritis, right hip. Patient has severe unremitting pain that affects sleep, daily activities, and work/hobbies. After pre-op clearance the patient was taken to the operating room on 03/29/2022 and underwent  Procedure(s): RIGHT TOTAL HIP ARTHROPLASTY ANTERIOR APPROACH.    Patient was given perioperative antibiotics:  Anti-infectives (From admission, onward)    Start     Dose/Rate Route Frequency Ordered Stop   03/29/22 2130  ceFAZolin (ANCEF)  IVPB 1 g/50 mL premix        1 g 100 mL/hr over 30 Minutes Intravenous  Once 03/29/22 2036 03/29/22 2115   03/29/22 1400  ceFAZolin (ANCEF) IVPB 1 g/50 mL premix  Status:  Discontinued        1 g 100 mL/hr over 30 Minutes Intravenous Every 6 hours 03/29/22 1017 03/29/22 2036   03/29/22 0600  ceFAZolin (ANCEF) IVPB 2g/100 mL premix        2 g 200 mL/hr over 30 Minutes Intravenous On call to O.R. 03/29/22 9390 03/29/22 0724        Patient was given sequential compression devices, early ambulation, and chemoprophylaxis to prevent DVT.  Patient benefited maximally from hospital stay and there were no complications.    Recent vital signs: Patient Vitals for the past 24 hrs:  BP Temp Temp src Pulse Resp SpO2  03/30/22 0829 119/70 -- -- 90 -- --  03/30/22 0459 (!) 96/59 99 F (37.2 C) -- 86 18 95 %  03/30/22 0146 104/65 99.1 F (37.3 C) Oral 91 18 97 %  03/29/22 2154 107/65 -- -- 80 -- 96 %  03/29/22 2153 (!) 92/49 98.9 F (37.2 C) -- 80 18 94 %  03/29/22 1728 102/64 98.7 F (37.1 C) -- 81 18 95 %  03/29/22 1334 106/61 97.9 F (36.6 C) -- 63 18 99 %  03/29/22 1227 114/64 97.8 F (36.6 C) Oral 71 16 99 %  03/29/22 1018 111/66 98.1 F (36.7  C) Oral 64 18 100 %  03/29/22 0945 103/62 98.7 F (37.1 C) -- 62 13 100 %  03/29/22 0930 103/68 -- -- 67 18 100 %  03/29/22 0915 94/62 -- -- 60 13 100 %  03/29/22 0900 97/73 -- -- 63 13 100 %     Recent laboratory studies:  Recent Labs    03/30/22 0342  WBC 11.8*  HGB 9.9*  HCT 30.6*  PLT 172  NA 140  K 3.8  CL 108  CO2 28  BUN 15  CREATININE 0.55  GLUCOSE 138*  CALCIUM 8.4*     Discharge Medications:   Allergies as of 03/30/2022       Reactions   Nsaids    History ulcers.  H/o gastric bypass   Statins    Muscle aches        Medication List     TAKE these medications    acetaminophen 500 MG tablet Commonly known as: TYLENOL Take 500 mg by mouth every 6 (six) hours as needed for moderate pain.   aspirin 81 MG  chewable tablet Chew 1 tablet (81 mg total) by mouth 2 (two) times daily.   COLLAGEN PO Take 1 Scoop by mouth daily.   freestyle lancets Use 1 lancet up to 4 times daily to check blood glucose as directed.   FREESTYLE LITE test strip Generic drug: glucose blood Use daily as instructed.   gabapentin 300 MG capsule Commonly known as: NEURONTIN Take 1 capsule by mouth 2 times daily.   hydrocortisone 25 MG suppository Commonly known as: ANUSOL-HC Insert rectally 2 times a day as needed   MELATONIN PO Take 1 tablet by mouth at bedtime as needed (sleep).   methocarbamol 500 MG tablet Commonly known as: ROBAXIN Take 1 tablet (500 mg total) by mouth every 6 (six) hours as needed for muscle spasms.   MULTIVITAMIN PO Take 2 tablets by mouth daily.   omeprazole 40 MG capsule Commonly known as: PRILOSEC Take 1 capsule by mouth 2 times daily before a meal. Open capsule and sprinkle in water, other liquid or applesauce What changed: when to take this   ondansetron 4 MG disintegrating tablet Commonly known as: ZOFRAN-ODT DISSOLVE 1 TABLET BY MOUTH EVERY 8 HOURS AS NEEDED FOR NAUSEA OR VOMITING (MAY TAKE 2 TABLETS IF 1 TABLET IS INEFFECTIVE)   oxyCODONE 5 MG immediate release tablet Commonly known as: Oxy IR/ROXICODONE Take 1-2 tablets (5-10 mg total) by mouth every 4 (four) hours as needed for moderate pain (pain score 4-6).   polyethylene glycol powder 17 GM/SCOOP powder Commonly known as: MiraLax Take 17 g by mouth daily. What changed:  when to take this reasons to take this               Durable Medical Equipment  (From admission, onward)           Start     Ordered   03/29/22 1018  DME 3 n 1  Once        03/29/22 1017   03/29/22 1018  DME Walker rolling  Once       Question Answer Comment  Walker: With 5 Inch Wheels   Patient needs a walker to treat with the following condition Status post total replacement of right hip      03/29/22 1017             Diagnostic Studies: DG Pelvis Portable  Result Date: 03/29/2022 CLINICAL DATA:  Status post right total hip replacement  EXAM: PORTABLE PELVIS 1 VIEWS COMPARISON:  None Available. FINDINGS: Interval postsurgical changes from right total hip arthroplasty. Arthroplasty components appear in their expected alignment. No periprosthetic fracture is identified. Expected postoperative changes within the overlying soft tissues. IMPRESSION: Postsurgical changes from right total hip replacement. Electronically Signed   By: Yetta Glassman M.D.   On: 03/29/2022 09:23   DG HIP UNILAT WITH PELVIS 1V RIGHT  Result Date: 03/29/2022 CLINICAL DATA:  Right hip replacement EXAM: DG HIP (WITH OR WITHOUT PELVIS) 1V RIGHT COMPARISON:  Pelvis radiographs 12/17/2021 FINDINGS: Three C-arm fluoroscopic images were obtained intraoperatively and submitted for post operative interpretation. Postsurgical changes reflecting right hip arthroplasty are seen. Hardware alignment is within expected limits, without evidence of complication. The symphysis pubis is intact. The left hip alignment is normal. Fluoro time 15 seconds, dose 1.55 mGy. Please see the performing provider's procedural report for further detail. IMPRESSION: Status post right hip replacement without evidence of immediate complication. Electronically Signed   By: Valetta Mole M.D.   On: 03/29/2022 08:45   DG C-Arm 1-60 Min-No Report  Result Date: 03/29/2022 Fluoroscopy was utilized by the requesting physician.  No radiographic interpretation.   DG Foot Complete Right  Result Date: 03/19/2022 CLINICAL DATA:  Right ankle and foot pain for 3 days. EXAM: RIGHT ANKLE - COMPLETE 3+ VIEW; RIGHT FOOT COMPLETE - 3+ VIEW COMPARISON:  None Available. FINDINGS: There is diffuse decreased bone mineralization. Right ankle: Mild distal medial and lateral malleolar degenerative osteophytes. The ankle mortise is symmetric and intact. Mild distal anterior tibial find degenerative  spurring. Mild-to-moderate plantar calcaneal heel spur. Mild-to-moderate chronic mineralization/enthesopathic change at the Achilles insertion on the calcaneus. Right foot: Minimal degenerative spurring at the lateral aspect of the great toe metatarsophalangeal joint but no significant joint space narrowing. No acute fracture or dislocation. IMPRESSION: 1. Mild distal medial and lateral malleolar degenerative osteophytes. 2. Mild-to-moderate plantar calcaneal heel spur. Electronically Signed   By: Yvonne Kendall M.D.   On: 03/19/2022 09:33   DG Ankle Complete Right  Result Date: 03/19/2022 CLINICAL DATA:  Right ankle and foot pain for 3 days. EXAM: RIGHT ANKLE - COMPLETE 3+ VIEW; RIGHT FOOT COMPLETE - 3+ VIEW COMPARISON:  None Available. FINDINGS: There is diffuse decreased bone mineralization. Right ankle: Mild distal medial and lateral malleolar degenerative osteophytes. The ankle mortise is symmetric and intact. Mild distal anterior tibial find degenerative spurring. Mild-to-moderate plantar calcaneal heel spur. Mild-to-moderate chronic mineralization/enthesopathic change at the Achilles insertion on the calcaneus. Right foot: Minimal degenerative spurring at the lateral aspect of the great toe metatarsophalangeal joint but no significant joint space narrowing. No acute fracture or dislocation. IMPRESSION: 1. Mild distal medial and lateral malleolar degenerative osteophytes. 2. Mild-to-moderate plantar calcaneal heel spur. Electronically Signed   By: Yvonne Kendall M.D.   On: 03/19/2022 09:33    Disposition: Discharge disposition: 06-Home-Health Care Svc          Follow-up Information     Mcarthur Rossetti, MD Follow up in 2 week(s).   Specialty: Orthopedic Surgery Contact information: [address] [place] [address] [phone]                  Signed: Erskine Emery 03/30/2022, 8:51 AM

## 2022-03-30 NOTE — Discharge Instructions (Signed)

## 2022-03-30 NOTE — Plan of Care (Signed)
  Problem: Education: Goal: Knowledge of General Education information will improve Description Including pain rating scale, medication(s)/side effects and non-pharmacologic comfort measures Outcome: Progressing   

## 2022-03-30 NOTE — Discharge Summary (Signed)
Patient ID: Sandra [name] MRN: [id number] DOB/AGE: 1960-10-26 61 y.o.  Admit date: 03/29/2022 Discharge date: 03/30/2022  Admission Diagnoses:  Principal Problem:   Unilateral primary osteoarthritis, right hip Active Problems:   Status post total replacement of right hip   Discharge Diagnoses:  Same  Past Medical History:  Diagnosis Date   Anxiety    Arthritis    CAD (coronary artery disease)    nonobstructive CAD in LAD per 2013 CTA coronary morph   Cancer [facility])    hzx of uterine cancer   Cholelithiasis 04/06/2021   Depression    Diabetes mellitus    no meds taken now, checks cbg once or twice a week    Difficult intubation    Gallbladder abscess 05/02/2021   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   marginal ulcer   GERD (gastroesophageal reflux disease)    H/O gastric bypass    Hemorrhoids, internal, with bleeding and Grade 2 prolapse 03/05/2013   All positions seen an anoscopy RP columns banded 03/05/2013 LL and RA banded 03/18/2013      History of endometrial cancer 2006 ---- S/P ABD. HYSTERECTOMY   STAGE I ENDOMETROID CARCINOMA   Hx of adenomatous colonic polyps 2011   BENIGN   Hyperlipidemia    no meds now   Hypertension    off meds since 2015 per pt    Surgeries: Procedure(s): RIGHT TOTAL HIP ARTHROPLASTY ANTERIOR APPROACH on 03/29/2022   Consultants:   Discharged Condition: Improved  Hospital Course: Prescilla Monger is an 61 y.o. female who was admitted 03/29/2022 for operative treatment ofUnilateral primary osteoarthritis, right hip. Patient has severe unremitting pain that affects sleep, daily activities, and work/hobbies. After pre-op clearance the patient was taken to the operating room on 03/29/2022 and underwent  Procedure(s): RIGHT TOTAL HIP ARTHROPLASTY ANTERIOR APPROACH.    Patient was given perioperative antibiotics:  Anti-infectives (From admission, onward)    Start     Dose/Rate Route Frequency Ordered Stop   03/29/22 2130  ceFAZolin (ANCEF)  IVPB 1 g/50 mL premix        1 g 100 mL/hr over 30 Minutes Intravenous  Once 03/29/22 2036 03/29/22 2115   03/29/22 1400  ceFAZolin (ANCEF) IVPB 1 g/50 mL premix  Status:  Discontinued        1 g 100 mL/hr over 30 Minutes Intravenous Every 6 hours 03/29/22 1017 03/29/22 2036   03/29/22 0600  ceFAZolin (ANCEF) IVPB 2g/100 mL premix        2 g 200 mL/hr over 30 Minutes Intravenous On call to O.R. 03/29/22 5465 03/29/22 0724        Patient was given sequential compression devices, early ambulation, and chemoprophylaxis to prevent DVT.  Patient benefited maximally from hospital stay and there were no complications.    Recent vital signs: Patient Vitals for the past 24 hrs:  BP Temp Temp src Pulse Resp SpO2  03/30/22 0829 119/70 -- -- 90 -- --  03/30/22 0459 (!) 96/59 99 F (37.2 C) -- 86 18 95 %  03/30/22 0146 104/65 99.1 F (37.3 C) Oral 91 18 97 %  03/29/22 2154 107/65 -- -- 80 -- 96 %  03/29/22 2153 (!) 92/49 98.9 F (37.2 C) -- 80 18 94 %  03/29/22 1728 102/64 98.7 F (37.1 C) -- 81 18 95 %  03/29/22 1334 106/61 97.9 F (36.6 C) -- 63 18 99 %  03/29/22 1227 114/64 97.8 F (36.6 C) Oral 71 16 99 %  03/29/22 1018 111/66 98.1 F (36.7  C) Oral 64 18 100 %  03/29/22 0945 103/62 98.7 F (37.1 C) -- 62 13 100 %  03/29/22 0930 103/68 -- -- 67 18 100 %  03/29/22 0915 94/62 -- -- 60 13 100 %  03/29/22 0900 97/73 -- -- 63 13 100 %  03/29/22 0847 110/63 97.7 F (36.5 C) -- 68 14 100 %     Recent laboratory studies:  Recent Labs    03/30/22 0342  WBC 11.8*  HGB 9.9*  HCT 30.6*  PLT 172  NA 140  K 3.8  CL 108  CO2 28  BUN 15  CREATININE 0.55  GLUCOSE 138*  CALCIUM 8.4*     Discharge Medications:   Allergies as of 03/30/2022       Reactions   Nsaids    History ulcers.  H/o gastric bypass   Statins    Muscle aches        Medication List     TAKE these medications    acetaminophen 500 MG tablet Commonly known as: TYLENOL Take 500 mg by mouth every 6  (six) hours as needed for moderate pain.   aspirin 81 MG chewable tablet Chew 1 tablet (81 mg total) by mouth 2 (two) times daily.   COLLAGEN PO Take 1 Scoop by mouth daily.   freestyle lancets Use 1 lancet up to 4 times daily to check blood glucose as directed.   FREESTYLE LITE test strip Generic drug: glucose blood Use daily as instructed.   gabapentin 300 MG capsule Commonly known as: NEURONTIN Take 1 capsule by mouth 2 times daily.   hydrocortisone 25 MG suppository Commonly known as: ANUSOL-HC Insert rectally 2 times a day as needed   MELATONIN PO Take 1 tablet by mouth at bedtime as needed (sleep).   methocarbamol 500 MG tablet Commonly known as: ROBAXIN Take 1 tablet (500 mg total) by mouth every 6 (six) hours as needed for muscle spasms.   MULTIVITAMIN PO Take 2 tablets by mouth daily.   omeprazole 40 MG capsule Commonly known as: PRILOSEC Take 1 capsule by mouth 2 times daily before a meal. Open capsule and sprinkle in water, other liquid or applesauce What changed: when to take this   ondansetron 4 MG disintegrating tablet Commonly known as: ZOFRAN-ODT DISSOLVE 1 TABLET BY MOUTH EVERY 8 HOURS AS NEEDED FOR NAUSEA OR VOMITING (MAY TAKE 2 TABLETS IF 1 TABLET IS INEFFECTIVE)   oxyCODONE 5 MG immediate release tablet Commonly known as: Oxy IR/ROXICODONE Take 1-2 tablets (5-10 mg total) by mouth every 4 (four) hours as needed for moderate pain (pain score 4-6).   polyethylene glycol powder 17 GM/SCOOP powder Commonly known as: MiraLax Take 17 g by mouth daily. What changed:  when to take this reasons to take this               Durable Medical Equipment  (From admission, onward)           Start     Ordered   03/29/22 1018  DME 3 n 1  Once        03/29/22 1017   03/29/22 1018  DME Walker rolling  Once       Question Answer Comment  Walker: With 5 Inch Wheels   Patient needs a walker to treat with the following condition Status post total  replacement of right hip      03/29/22 1017            Diagnostic Studies: DG Pelvis Portable  Result Date: 03/29/2022 CLINICAL DATA:  Status post right total hip replacement EXAM: PORTABLE PELVIS 1 VIEWS COMPARISON:  None Available. FINDINGS: Interval postsurgical changes from right total hip arthroplasty. Arthroplasty components appear in their expected alignment. No periprosthetic fracture is identified. Expected postoperative changes within the overlying soft tissues. IMPRESSION: Postsurgical changes from right total hip replacement. Electronically Signed   By: Yetta Glassman M.D.   On: 03/29/2022 09:23   DG HIP UNILAT WITH PELVIS 1V RIGHT  Result Date: 03/29/2022 CLINICAL DATA:  Right hip replacement EXAM: DG HIP (WITH OR WITHOUT PELVIS) 1V RIGHT COMPARISON:  Pelvis radiographs 12/17/2021 FINDINGS: Three C-arm fluoroscopic images were obtained intraoperatively and submitted for post operative interpretation. Postsurgical changes reflecting right hip arthroplasty are seen. Hardware alignment is within expected limits, without evidence of complication. The symphysis pubis is intact. The left hip alignment is normal. Fluoro time 15 seconds, dose 1.55 mGy. Please see the performing provider's procedural report for further detail. IMPRESSION: Status post right hip replacement without evidence of immediate complication. Electronically Signed   By: Valetta Mole M.D.   On: 03/29/2022 08:45   DG C-Arm 1-60 Min-No Report  Result Date: 03/29/2022 Fluoroscopy was utilized by the requesting physician.  No radiographic interpretation.   DG Foot Complete Right  Result Date: 03/19/2022 CLINICAL DATA:  Right ankle and foot pain for 3 days. EXAM: RIGHT ANKLE - COMPLETE 3+ VIEW; RIGHT FOOT COMPLETE - 3+ VIEW COMPARISON:  None Available. FINDINGS: There is diffuse decreased bone mineralization. Right ankle: Mild distal medial and lateral malleolar degenerative osteophytes. The ankle mortise is symmetric  and intact. Mild distal anterior tibial find degenerative spurring. Mild-to-moderate plantar calcaneal heel spur. Mild-to-moderate chronic mineralization/enthesopathic change at the Achilles insertion on the calcaneus. Right foot: Minimal degenerative spurring at the lateral aspect of the great toe metatarsophalangeal joint but no significant joint space narrowing. No acute fracture or dislocation. IMPRESSION: 1. Mild distal medial and lateral malleolar degenerative osteophytes. 2. Mild-to-moderate plantar calcaneal heel spur. Electronically Signed   By: Yvonne Kendall M.D.   On: 03/19/2022 09:33   DG Ankle Complete Right  Result Date: 03/19/2022 CLINICAL DATA:  Right ankle and foot pain for 3 days. EXAM: RIGHT ANKLE - COMPLETE 3+ VIEW; RIGHT FOOT COMPLETE - 3+ VIEW COMPARISON:  None Available. FINDINGS: There is diffuse decreased bone mineralization. Right ankle: Mild distal medial and lateral malleolar degenerative osteophytes. The ankle mortise is symmetric and intact. Mild distal anterior tibial find degenerative spurring. Mild-to-moderate plantar calcaneal heel spur. Mild-to-moderate chronic mineralization/enthesopathic change at the Achilles insertion on the calcaneus. Right foot: Minimal degenerative spurring at the lateral aspect of the great toe metatarsophalangeal joint but no significant joint space narrowing. No acute fracture or dislocation. IMPRESSION: 1. Mild distal medial and lateral malleolar degenerative osteophytes. 2. Mild-to-moderate plantar calcaneal heel spur. Electronically Signed   By: Yvonne Kendall M.D.   On: 03/19/2022 09:33    Disposition: Discharge disposition: 06-Home-Health Care Svc          Follow-up Information     Mcarthur Rossetti, MD Follow up in 2 week(s).   Specialty: Orthopedic Surgery Contact information: [address] [place] [address] [phone]                  Signed: Erskine Emery 03/30/2022, 8:38 AM

## 2022-03-30 NOTE — Progress Notes (Signed)
Physical Therapy Treatment Patient Details Name: Sandra [name] MRN: [id number] DOB: 12-07-1960 Today's Date: 03/30/2022   History of Present Illness Pt s/p R THR and with hx of CAD and DM    PT Comments    Pt motivated and progressing well with mobility.  Pt performed HEP with written instruction provided and reviewed, ambulated increased distance in hall, negotiated stairs and reviewed car transfers.  Pt eager for dc home this date.   Recommendations for follow up therapy are one component of a multi-disciplinary discharge planning process, led by the attending physician.  Recommendations may be updated based on patient status, additional functional criteria and insurance authorization.  Follow Up Recommendations  Follow physician's recommendations for discharge plan and follow up therapies     Assistance Recommended at Discharge Intermittent Supervision/Assistance  Patient can return home with the following A little help with walking and/or transfers;A little help with bathing/dressing/bathroom;Assistance with cooking/housework;Assist for transportation;Help with stairs or ramp for entrance   Equipment Recommendations  Rolling walker (2 wheels)    Recommendations for Other Services       Precautions / Restrictions Precautions Precautions: Fall Restrictions Weight Bearing Restrictions: No Other Position/Activity Restrictions: WBAT     Mobility  Bed Mobility Overal bed mobility: Needs Assistance Bed Mobility: Supine to Sit     Supine to sit: Min guard     General bed mobility comments: cues for sequence and use of L LE to self assist    Transfers Overall transfer level: Needs assistance Equipment used: Rolling walker (2 wheels) Transfers: Sit to/from Stand Sit to Stand: Min guard           General transfer comment: cues for LE management and use of UEs to self assist    Ambulation/Gait Ambulation/Gait assistance: Min guard, Supervision Gait Distance  (Feet): 100 Feet Assistive device: Rolling walker (2 wheels) Gait Pattern/deviations: Step-to pattern, Decreased step length - right, Decreased step length - left, Shuffle, Trunk flexed Gait velocity: decr     General Gait Details: cues for sequence, posture and position from RW   Stairs Stairs: Yes Stairs assistance: Min guard Stair Management: Two rails, Step to pattern, Forwards, With walker Number of Stairs: 3 General stair comments: cues for sequence   Wheelchair Mobility    Modified Rankin (Stroke Patients Only)       Balance Overall balance assessment: Mild deficits observed, not formally tested                                          Cognition Arousal/Alertness: Awake/alert Behavior During Therapy: WFL for tasks assessed/performed Overall Cognitive Status: Within Functional Limits for tasks assessed                                          Exercises Total Joint Exercises Ankle Circles/Pumps: AROM, 15 reps, Supine, Both Quad Sets: AROM, Both, 10 reps, Supine Heel Slides: AAROM, Right, 20 reps, Supine Hip ABduction/ADduction: AAROM, Right, 15 reps, Supine    General Comments        Pertinent Vitals/Pain Pain Assessment Pain Assessment: 0-10 Pain Score: 4  Pain Location: R hip Pain Descriptors / Indicators: Aching, Sore Pain Intervention(s): Limited activity within patient's tolerance, Monitored during session, Premedicated before session, Ice applied    Home Living  Prior Function            PT Goals (current goals can now be found in the care plan section) Acute Rehab PT Goals Patient Stated Goal: REgain IND PT Goal Formulation: With patient Time For Goal Achievement: 04/05/22 Potential to Achieve Goals: Good Progress towards PT goals: Progressing toward goals    Frequency    7X/week      PT Plan Current plan remains appropriate    Co-evaluation               AM-PAC PT "6 Clicks" Mobility   Outcome Measure  Help needed turning from your back to your side while in a flat bed without using bedrails?: A Little Help needed moving from lying on your back to sitting on the side of a flat bed without using bedrails?: A Little Help needed moving to and from a bed to a chair (including a wheelchair)?: A Little Help needed standing up from a chair using your arms (e.g., wheelchair or bedside chair)?: A Little Help needed to walk in hospital room?: A Little Help needed climbing 3-5 steps with a railing? : A Little 6 Click Score: 18    End of Session Equipment Utilized During Treatment: Gait belt Activity Tolerance: Patient tolerated treatment well Patient left: in chair;with call bell/phone within reach;with chair alarm set Nurse Communication: Mobility status PT Visit Diagnosis: Difficulty in walking, not elsewhere classified (R26.2)     Time: 5409-8119 PT Time Calculation (min) (ACUTE ONLY): 36 min  Charges:  $Gait Training: 8-22 mins $Therapeutic Exercise: 8-22 mins                     Debe Coder PT Acute Rehabilitation Services Pager [phone] Office [phone]    Tisa Weisel 03/30/2022, 12:01 PM

## 2022-03-30 NOTE — Progress Notes (Signed)
The patient is alert and oriented and has been seen by her physician. The orders for discharge were written. IV has been removed. Went over discharge instructions with patient and family. She is being discharged via wheelchair with all of her belongings.

## 2022-03-30 NOTE — TOC Transition Note (Addendum)
Transition of Care [facility]) - CM/SW Discharge Note   Patient Details  Name: Syrena Burges MRN: [id number] Date of Birth: 15-Apr-1961  [facility] CM/SW Contact:  Vassie Moselle, [place] Phone Number: 03/30/2022, 10:37 AM   Clinical Narrative:    Confirmed plan for HHPT w/ [facility]. Pt agreeable to having RW ordered/delivered however declines need for 3in1. RW has been ordered through [employer] and will be delivered to pt's room prior to discharge.   Update: Per [employer]- face to face encounter by MD needs to be completed prior to DME being delivered. RN to notify MD.    Final next level of care: Home w Home Health Services Barriers to Discharge: No Barriers Identified   Patient Goals and CMS Choice Patient states their goals for this hospitalization and ongoing recovery are:: To go home CMS Medicare.gov Compare Post Acute Care list provided to:: Patient Choice offered to / list presented to : Patient  Discharge Placement                       Discharge Plan and Services                DME Arranged: Walker rolling DME Agency: [employer] Date DME Agency Contacted: 03/30/22 Time DME Agency Contacted: [id number] Representative spoke with at DME Agency: [place]: PT [place]: [place] Date [place]: 03/30/22 Time [place]: 1037 Representative spoke with at [place]: Claiborne Billings  Social Determinants of Health ([place]) Interventions     Readmission Risk Interventions     No data to display

## 2022-04-01 ENCOUNTER — Encounter (HOSPITAL_COMMUNITY): Payer: Self-pay | Admitting: Orthopaedic Surgery

## 2022-04-01 DIAGNOSIS — E119 Type 2 diabetes mellitus without complications: Secondary | ICD-10-CM | POA: Diagnosis not present

## 2022-04-01 DIAGNOSIS — F4323 Adjustment disorder with mixed anxiety and depressed mood: Secondary | ICD-10-CM | POA: Diagnosis not present

## 2022-04-01 DIAGNOSIS — I10 Essential (primary) hypertension: Secondary | ICD-10-CM | POA: Diagnosis not present

## 2022-04-01 DIAGNOSIS — Z471 Aftercare following joint replacement surgery: Secondary | ICD-10-CM | POA: Diagnosis not present

## 2022-04-01 DIAGNOSIS — F32A Depression, unspecified: Secondary | ICD-10-CM | POA: Diagnosis not present

## 2022-04-01 DIAGNOSIS — Z96641 Presence of right artificial hip joint: Secondary | ICD-10-CM | POA: Diagnosis not present

## 2022-04-01 DIAGNOSIS — I2510 Atherosclerotic heart disease of native coronary artery without angina pectoris: Secondary | ICD-10-CM | POA: Diagnosis not present

## 2022-04-01 DIAGNOSIS — F419 Anxiety disorder, unspecified: Secondary | ICD-10-CM | POA: Diagnosis not present

## 2022-04-01 DIAGNOSIS — M1711 Unilateral primary osteoarthritis, right knee: Secondary | ICD-10-CM | POA: Diagnosis not present

## 2022-04-03 DIAGNOSIS — I10 Essential (primary) hypertension: Secondary | ICD-10-CM | POA: Diagnosis not present

## 2022-04-03 DIAGNOSIS — F419 Anxiety disorder, unspecified: Secondary | ICD-10-CM | POA: Diagnosis not present

## 2022-04-03 DIAGNOSIS — E119 Type 2 diabetes mellitus without complications: Secondary | ICD-10-CM | POA: Diagnosis not present

## 2022-04-03 DIAGNOSIS — F32A Depression, unspecified: Secondary | ICD-10-CM | POA: Diagnosis not present

## 2022-04-03 DIAGNOSIS — I2510 Atherosclerotic heart disease of native coronary artery without angina pectoris: Secondary | ICD-10-CM | POA: Diagnosis not present

## 2022-04-03 DIAGNOSIS — Z471 Aftercare following joint replacement surgery: Secondary | ICD-10-CM | POA: Diagnosis not present

## 2022-04-03 DIAGNOSIS — F4323 Adjustment disorder with mixed anxiety and depressed mood: Secondary | ICD-10-CM | POA: Diagnosis not present

## 2022-04-03 DIAGNOSIS — Z96641 Presence of right artificial hip joint: Secondary | ICD-10-CM | POA: Diagnosis not present

## 2022-04-03 DIAGNOSIS — M1711 Unilateral primary osteoarthritis, right knee: Secondary | ICD-10-CM | POA: Diagnosis not present

## 2022-04-03 NOTE — Progress Notes (Deleted)
[name] Phone: [phone] Subjective:    I'm seeing this patient by the request  of:  Burns, Claudina Lick, MD  CC:   [id number]  Sandra [name] is a 61 y.o. female coming in with complaint of back and neck pain. OMT 02/05/2022. R THR on 03/29/2022. Patient states   Medications patient has been prescribed: None  Taking:         Reviewed prior external information including notes and imaging from previsou exam, outside providers and external EMR if available.   As well as notes that were available from care everywhere and other healthcare systems.  Past medical history, social, surgical and family history all reviewed in electronic medical record.  No pertanent information unless stated regarding to the chief complaint.   Past Medical History:  Diagnosis Date   Anxiety    Arthritis    CAD (coronary artery disease)    nonobstructive CAD in LAD per 2013 CTA coronary morph   Cancer [facility])    hzx of uterine cancer   Cholelithiasis 04/06/2021   Depression    Diabetes mellitus    no meds taken now, checks cbg once or twice a week    Difficult intubation    Gallbladder abscess 05/02/2021   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   marginal ulcer   GERD (gastroesophageal reflux disease)    H/O gastric bypass    Hemorrhoids, internal, with bleeding and Grade 2 prolapse 03/05/2013   All positions seen an anoscopy RP columns banded 03/05/2013 LL and RA banded 03/18/2013      History of endometrial cancer 2006 ---- S/P ABD. HYSTERECTOMY   STAGE I ENDOMETROID CARCINOMA   Hx of adenomatous colonic polyps 2011   BENIGN   Hyperlipidemia    no meds now   Hypertension    off meds since 2015 per pt    Allergies  Allergen Reactions   Nsaids     History ulcers.  H/o gastric bypass   Statins     Muscle aches     Review of Systems:  No headache, visual changes, nausea, vomiting, diarrhea, constipation,  dizziness, abdominal pain, skin rash, fevers, chills, night sweats, weight loss, swollen lymph nodes, body aches, joint swelling, chest pain, shortness of breath, mood changes. POSITIVE muscle aches  Objective  There were no vitals taken for this visit.   General: No apparent distress alert and oriented x3 mood and affect normal, dressed appropriately.  HEENT: Pupils equal, extraocular movements intact  Respiratory: Patient's speak in full sentences and does not appear short of breath  Cardiovascular: No lower extremity edema, non tender, no erythema  Gait MSK:  Back   Osteopathic findings  C2 flexed rotated and side bent right C6 flexed rotated and side bent left T3 extended rotated and side bent right inhaled rib T9 extended rotated and side bent left L2 flexed rotated and side bent right Sacrum right on right       Assessment and Plan:  No problem-specific Assessment & Plan notes found for this encounter.    Nonallopathic problems  Decision today to treat with OMT was based on Physical Exam  After verbal consent patient was treated with HVLA, ME, FPR techniques in cervical, rib, thoracic, lumbar, and sacral  areas  Patient tolerated the procedure well with improvement in symptoms  Patient given exercises, stretches and lifestyle modifications  See medications in patient instructions if given  Patient will follow up in 4-8 weeks  Note: This dictation was prepared with Dragon dictation along with smaller phrase technology. Any transcriptional errors that result from this process are unintentional.

## 2022-04-05 DIAGNOSIS — M1711 Unilateral primary osteoarthritis, right knee: Secondary | ICD-10-CM | POA: Diagnosis not present

## 2022-04-05 DIAGNOSIS — Z96641 Presence of right artificial hip joint: Secondary | ICD-10-CM | POA: Diagnosis not present

## 2022-04-05 DIAGNOSIS — F419 Anxiety disorder, unspecified: Secondary | ICD-10-CM | POA: Diagnosis not present

## 2022-04-05 DIAGNOSIS — I10 Essential (primary) hypertension: Secondary | ICD-10-CM | POA: Diagnosis not present

## 2022-04-05 DIAGNOSIS — E119 Type 2 diabetes mellitus without complications: Secondary | ICD-10-CM | POA: Diagnosis not present

## 2022-04-05 DIAGNOSIS — F32A Depression, unspecified: Secondary | ICD-10-CM | POA: Diagnosis not present

## 2022-04-05 DIAGNOSIS — I2510 Atherosclerotic heart disease of native coronary artery without angina pectoris: Secondary | ICD-10-CM | POA: Diagnosis not present

## 2022-04-05 DIAGNOSIS — Z471 Aftercare following joint replacement surgery: Secondary | ICD-10-CM | POA: Diagnosis not present

## 2022-04-05 DIAGNOSIS — F4323 Adjustment disorder with mixed anxiety and depressed mood: Secondary | ICD-10-CM | POA: Diagnosis not present

## 2022-04-09 ENCOUNTER — Ambulatory Visit: Payer: 59 | Admitting: Family Medicine

## 2022-04-09 DIAGNOSIS — I10 Essential (primary) hypertension: Secondary | ICD-10-CM | POA: Diagnosis not present

## 2022-04-09 DIAGNOSIS — Z471 Aftercare following joint replacement surgery: Secondary | ICD-10-CM | POA: Diagnosis not present

## 2022-04-09 DIAGNOSIS — F32A Depression, unspecified: Secondary | ICD-10-CM | POA: Diagnosis not present

## 2022-04-09 DIAGNOSIS — Z96641 Presence of right artificial hip joint: Secondary | ICD-10-CM | POA: Diagnosis not present

## 2022-04-09 DIAGNOSIS — I2510 Atherosclerotic heart disease of native coronary artery without angina pectoris: Secondary | ICD-10-CM | POA: Diagnosis not present

## 2022-04-09 DIAGNOSIS — E119 Type 2 diabetes mellitus without complications: Secondary | ICD-10-CM | POA: Diagnosis not present

## 2022-04-09 DIAGNOSIS — F4323 Adjustment disorder with mixed anxiety and depressed mood: Secondary | ICD-10-CM | POA: Diagnosis not present

## 2022-04-09 DIAGNOSIS — M1711 Unilateral primary osteoarthritis, right knee: Secondary | ICD-10-CM | POA: Diagnosis not present

## 2022-04-09 DIAGNOSIS — F419 Anxiety disorder, unspecified: Secondary | ICD-10-CM | POA: Diagnosis not present

## 2022-04-10 DIAGNOSIS — F4323 Adjustment disorder with mixed anxiety and depressed mood: Secondary | ICD-10-CM | POA: Diagnosis not present

## 2022-04-10 DIAGNOSIS — Z471 Aftercare following joint replacement surgery: Secondary | ICD-10-CM | POA: Diagnosis not present

## 2022-04-10 DIAGNOSIS — M1711 Unilateral primary osteoarthritis, right knee: Secondary | ICD-10-CM | POA: Diagnosis not present

## 2022-04-10 DIAGNOSIS — I2510 Atherosclerotic heart disease of native coronary artery without angina pectoris: Secondary | ICD-10-CM | POA: Diagnosis not present

## 2022-04-10 DIAGNOSIS — F419 Anxiety disorder, unspecified: Secondary | ICD-10-CM | POA: Diagnosis not present

## 2022-04-10 DIAGNOSIS — E119 Type 2 diabetes mellitus without complications: Secondary | ICD-10-CM | POA: Diagnosis not present

## 2022-04-10 DIAGNOSIS — F32A Depression, unspecified: Secondary | ICD-10-CM | POA: Diagnosis not present

## 2022-04-10 DIAGNOSIS — I10 Essential (primary) hypertension: Secondary | ICD-10-CM | POA: Diagnosis not present

## 2022-04-10 DIAGNOSIS — Z96641 Presence of right artificial hip joint: Secondary | ICD-10-CM | POA: Diagnosis not present

## 2022-04-11 ENCOUNTER — Encounter: Payer: Self-pay | Admitting: Orthopaedic Surgery

## 2022-04-11 ENCOUNTER — Ambulatory Visit (INDEPENDENT_AMBULATORY_CARE_PROVIDER_SITE_OTHER): Payer: 59 | Admitting: Orthopaedic Surgery

## 2022-04-11 DIAGNOSIS — Z96641 Presence of right artificial hip joint: Secondary | ICD-10-CM

## 2022-04-11 NOTE — Progress Notes (Signed)
The patient is here for first postoperative visit 2 weeks status post a right total hip arthroplasty.  She is already off of pain medications.  She is now with a walker.  She has 1 more visit with physical therapy tomorrow.  Her right hip incision looks good.  The staples are removed and Steri-Strips applied.  Her leg lengths are equal.  Her calf is soft.  She has been compliant with a baby aspirin twice daily.  She was not on this before surgery but she can stop it from my standpoint.  She will continue increase activities as comfort allows.  She will [employer] work until we see her back in 4 weeks.  All questions and concerns were answered and addressed.

## 2022-04-12 ENCOUNTER — Other Ambulatory Visit (HOSPITAL_COMMUNITY): Payer: Self-pay

## 2022-04-12 DIAGNOSIS — I10 Essential (primary) hypertension: Secondary | ICD-10-CM | POA: Diagnosis not present

## 2022-04-12 DIAGNOSIS — Z471 Aftercare following joint replacement surgery: Secondary | ICD-10-CM | POA: Diagnosis not present

## 2022-04-12 DIAGNOSIS — M1711 Unilateral primary osteoarthritis, right knee: Secondary | ICD-10-CM | POA: Diagnosis not present

## 2022-04-12 DIAGNOSIS — I2510 Atherosclerotic heart disease of native coronary artery without angina pectoris: Secondary | ICD-10-CM | POA: Diagnosis not present

## 2022-04-12 DIAGNOSIS — F419 Anxiety disorder, unspecified: Secondary | ICD-10-CM | POA: Diagnosis not present

## 2022-04-12 DIAGNOSIS — E119 Type 2 diabetes mellitus without complications: Secondary | ICD-10-CM | POA: Diagnosis not present

## 2022-04-12 DIAGNOSIS — Z96641 Presence of right artificial hip joint: Secondary | ICD-10-CM | POA: Diagnosis not present

## 2022-04-12 DIAGNOSIS — F32A Depression, unspecified: Secondary | ICD-10-CM | POA: Diagnosis not present

## 2022-04-12 DIAGNOSIS — F4323 Adjustment disorder with mixed anxiety and depressed mood: Secondary | ICD-10-CM | POA: Diagnosis not present

## 2022-04-23 ENCOUNTER — Other Ambulatory Visit: Payer: Self-pay

## 2022-04-23 ENCOUNTER — Other Ambulatory Visit: Payer: Self-pay | Admitting: Family Medicine

## 2022-04-24 ENCOUNTER — Other Ambulatory Visit: Payer: Self-pay

## 2022-04-24 ENCOUNTER — Other Ambulatory Visit (HOSPITAL_COMMUNITY): Payer: Self-pay

## 2022-04-24 MED ORDER — GABAPENTIN 300 MG PO CAPS
300.0000 mg | ORAL_CAPSULE | Freq: Two times a day (BID) | ORAL | 0 refills | Status: DC
  Filled 2022-04-24: qty 180, 90d supply, fill #0

## 2022-05-08 ENCOUNTER — Encounter: Payer: Self-pay | Admitting: Orthopaedic Surgery

## 2022-05-08 ENCOUNTER — Ambulatory Visit (INDEPENDENT_AMBULATORY_CARE_PROVIDER_SITE_OTHER): Payer: Commercial Managed Care - PPO | Admitting: Orthopaedic Surgery

## 2022-05-08 DIAGNOSIS — Z96641 Presence of right artificial hip joint: Secondary | ICD-10-CM

## 2022-05-08 NOTE — Progress Notes (Signed)
The patient is here today at about 6 weeks status post a right total hip arthroplasty.  She is only 51.  She is ambulate with a cane.  She is doing well overall.  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  Her right hip is beginning to move more smoothly and fluidly.  She still has slight limp to her gait and is ambulate with a cane.  She is only taken Tylenol for pain and occasional muscle relaxant.  She will continue to slowly increase her activities as comfort allows.  Will see her back in 4 weeks to see how she is doing overall but no x-rays are needed.

## 2022-05-08 NOTE — Progress Notes (Deleted)
[place] Phone: [phone] Subjective:    I'm seeing this patient by the request  of:  Burns, Claudina Lick, MD  CC:   [id number]  Sandra [name] is a 62 y.o. female coming in with complaint of back and neck pain. OMT 02/05/2022. Hip replacement 03/2022. Patient states   Medications patient has been prescribed: Gabapentin  Taking:         Reviewed prior external information including notes and imaging from previsou exam, outside providers and external EMR if available.   As well as notes that were available from care everywhere and other healthcare systems.  Past medical history, social, surgical and family history all reviewed in electronic medical record.  No pertanent information unless stated regarding to the chief complaint.   Past Medical History:  Diagnosis Date   Anxiety    Arthritis    CAD (coronary artery disease)    nonobstructive CAD in LAD per 2013 CTA coronary morph   Cancer [facility])    hzx of uterine cancer   Cholelithiasis 04/06/2021   Depression    Diabetes mellitus    no meds taken now, checks cbg once or twice a week    Difficult intubation    Gallbladder abscess 05/02/2021   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   marginal ulcer   GERD (gastroesophageal reflux disease)    H/O gastric bypass    Hemorrhoids, internal, with bleeding and Grade 2 prolapse 03/05/2013   All positions seen an anoscopy RP columns banded 03/05/2013 LL and RA banded 03/18/2013      History of endometrial cancer 2006 ---- S/P ABD. HYSTERECTOMY   STAGE I ENDOMETROID CARCINOMA   Hx of adenomatous colonic polyps 2011   BENIGN   Hyperlipidemia    no meds now   Hypertension    off meds since 2015 per pt    Allergies  Allergen Reactions   Nsaids     History ulcers.  H/o gastric bypass   Statins     Muscle aches     Review of Systems:  No headache, visual changes, nausea, vomiting, diarrhea,  constipation, dizziness, abdominal pain, skin rash, fevers, chills, night sweats, weight loss, swollen lymph nodes, body aches, joint swelling, chest pain, shortness of breath, mood changes. POSITIVE muscle aches  Objective  There were no vitals taken for this visit.   General: No apparent distress alert and oriented x3 mood and affect normal, dressed appropriately.  HEENT: Pupils equal, extraocular movements intact  Respiratory: Patient's speak in full sentences and does not appear short of breath  Cardiovascular: No lower extremity edema, non tender, no erythema  Gait MSK:  Back   Osteopathic findings  C2 flexed rotated and side bent right C6 flexed rotated and side bent left T3 extended rotated and side bent right inhaled rib T9 extended rotated and side bent left L2 flexed rotated and side bent right Sacrum right on right       Assessment and Plan:  No problem-specific Assessment & Plan notes found for this encounter.    Nonallopathic problems  Decision today to treat with OMT was based on Physical Exam  After verbal consent patient was treated with HVLA, ME, FPR techniques in cervical, rib, thoracic, lumbar, and sacral  areas  Patient tolerated the procedure well with improvement in symptoms  Patient given exercises, stretches and lifestyle modifications  See medications in patient instructions if given  Patient will follow up in 4-8 weeks  Note: This dictation was prepared with Dragon dictation along with smaller phrase technology. Any transcriptional errors that result from this process are unintentional.

## 2022-05-14 ENCOUNTER — Ambulatory Visit: Payer: 59 | Admitting: Family Medicine

## 2022-05-17 ENCOUNTER — Telehealth: Payer: Self-pay | Admitting: Orthopaedic Surgery

## 2022-05-17 NOTE — Telephone Encounter (Signed)
[employer] forms received. To [employer]. 

## 2022-05-31 ENCOUNTER — Telehealth: Payer: Self-pay | Admitting: Orthopaedic Surgery

## 2022-05-31 NOTE — Telephone Encounter (Signed)
[employer] forms received. To [employer]. 

## 2022-06-05 ENCOUNTER — Encounter: Payer: Self-pay | Admitting: Orthopaedic Surgery

## 2022-06-05 ENCOUNTER — Ambulatory Visit: Payer: Commercial Managed Care - PPO | Admitting: Orthopaedic Surgery

## 2022-06-05 DIAGNOSIS — Z96641 Presence of right artificial hip joint: Secondary | ICD-10-CM

## 2022-06-05 NOTE — Progress Notes (Signed)
The patient is a 62 year old female who is now 10 weeks status post a right total hip arthroplasty.  She is very active.  She is doing well overall.  She is not needing to walk with an assistive device.  She is very returned to work as well.  On exam her right operative hip moves smoothly and fluidly.  There is no significant limp to her gait at all and her leg lengths are equal.  From my standpoint she can return to work starting Monday, February 12 but should only work for half days only for the first 2 weeks.  Starting February 26 she can resume full work days.  From my standpoint also, we will next need to see her back in 6 months unless there are issues.  At that visit we will have a standing low AP pelvis and lateral of her right operative hip.

## 2022-07-04 ENCOUNTER — Encounter: Payer: Self-pay | Admitting: Radiology

## 2022-07-14 ENCOUNTER — Other Ambulatory Visit: Payer: Self-pay | Admitting: Internal Medicine

## 2022-07-14 ENCOUNTER — Other Ambulatory Visit: Payer: Self-pay | Admitting: Family Medicine

## 2022-07-15 ENCOUNTER — Other Ambulatory Visit: Payer: Self-pay

## 2022-07-15 ENCOUNTER — Encounter (HOSPITAL_COMMUNITY): Payer: Self-pay

## 2022-07-15 ENCOUNTER — Other Ambulatory Visit (HOSPITAL_COMMUNITY): Payer: Self-pay

## 2022-07-15 MED ORDER — ONDANSETRON 4 MG PO TBDP
ORAL_TABLET | ORAL | 3 refills | Status: DC
  Filled 2022-07-15: qty 30, 10d supply, fill #0
  Filled 2022-10-16: qty 30, 10d supply, fill #1
  Filled 2023-01-14: qty 30, 5d supply, fill #2
  Filled 2023-03-05: qty 30, 5d supply, fill #3

## 2022-07-15 MED ORDER — GABAPENTIN 300 MG PO CAPS
300.0000 mg | ORAL_CAPSULE | Freq: Two times a day (BID) | ORAL | 0 refills | Status: DC
  Filled 2022-07-15: qty 180, 90d supply, fill #0

## 2022-07-16 ENCOUNTER — Other Ambulatory Visit (HOSPITAL_COMMUNITY): Payer: Self-pay

## 2022-07-18 ENCOUNTER — Other Ambulatory Visit (HOSPITAL_COMMUNITY): Payer: Self-pay

## 2022-08-02 NOTE — Progress Notes (Unsigned)
[facility] Consetta Cosner D.O. [facility] [address] [place] [id number] Phone: [phone] Subjective:   Sandra [name], Sandra [name], am serving as a scribe for Dr. Antoine PrimasZachary Wyett Narine.  I'm seeing this patient by the request  of:  Sandra [name], Stacy J, MD  CC: Back and neck pain  MVH:[id number]:Subjective  Sandra Georgina QuintRenee [name] is a 62 y.o. female coming in with complaint of back and neck pain. OMT 02/06/2023. R THR in December 2023. Patient states doing well. Here for manipulation. Finger and knee still bothersome. No other concerns  Medications patient has been prescribed: Gabapentin  Taking:         Reviewed prior external information including notes and imaging from previsou exam, outside providers and external EMR if available.   As well as notes that were available from care everywhere and other healthcare systems.  Past medical history, social, surgical and family history all reviewed in electronic medical record.  No pertanent information unless stated regarding to the chief complaint.   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 HYSTERECTOMY   STAGE I ENDOMETROID CARCINOMA   Hx of adenomatous colonic polyps 2011   BENIGN   Hyperlipidemia    no meds now   Hypertension    off meds since 2015 per pt    Allergies  Allergen Reactions    Nsaids     History ulcers.  H/o gastric bypass   Statins     Muscle aches     Review of Systems:  No headache, visual changes, nausea, vomiting, diarrhea, constipation, dizziness, abdominal pain, skin rash, fevers, chills, night sweats, weight loss, swollen lymph nodes, body aches, joint swelling, chest pain, shortness of breath, mood changes. POSITIVE muscle aches  Objective  Blood pressure 130/80, pulse 82, height 5\' 2"  (1.575 m), weight 144 lb (65.3 kg), SpO2 98 %.   General: No apparent distress alert and oriented x3 mood and affect normal, dressed appropriately.  HEENT: Pupils equal, extraocular movements intact  Respiratory: Patient's speak in full sentences and does not appear short of breath  Cardiovascular: No lower extremity edema, non tender, no erythema  Back exam does have some loss lordosis.  Some tenderness to palpation of the paraspinal musculature.  Patient's neck exam does have some limited sidebending bilaterally.  Hand exam does still have some trace swelling of the fingers noted but no true triggering noted.  Right knee exam does have tenderness to palpation over the medial joint line.  Fullness noted just posterior to the midline of the midline.  Lacks last 5 degrees of flexion secondary to pain  Limited muscular skeletal ultrasound was performed and interpreted by Antoine PrimasSMITH, Adir Schicker, M  Limited ultrasound shows the patient does have hypoechoic changes consistent with  a parameniscal cyst noted.  Some calcific changes that is consistent with either pseudogout or gout in the medial meniscus also noted. Impression: Parameniscal cyst with potential gouty deposits  Osteopathic findings  C3 flexed rotated and side bent right C7 flexed rotated and side bent left T3 extended rotated and side bent right inhaled rib T9 extended rotated and side bent left L2 flexed rotated and side bent right Sacrum right on right   Procedure: Real-time Ultrasound Guided Injection of right  parameniscal cyst Device: GE Logiq Q7 Ultrasound guided injection is preferred based studies that show increased duration, increased effect, greater accuracy, decreased procedural pain, increased response rate, and decreased cost with ultrasound guided versus blind injection.  Verbal informed consent obtained.  Time-out conducted.  Noted no overlying erythema, induration, or other signs of local infection.  Skin prepped in a sterile fashion.  Local anesthesia: Topical Ethyl chloride.  With sterile technique and under real time ultrasound guidance: With a 21-gauge 2 inch needle injected with 0.5 cc of 0.5% Marcaine and unfortunately cyst did break open at that time.  Unable to do any aspiration.  1 cc of Kenalog 40 mg/mL used. Completed without difficulty  Pain immediately resolved suggesting accurate placement of the medication.  Advised to call if fevers/chills, erythema, induration, drainage, or persistent bleeding.  Impression: Technically successful ultrasound guided injection.    Assessment and Plan:  Perimeniscal cyst of right knee Patient given injection and tolerated the procedure well, discussed icing regimen and home exercises, patient did have improvement in range of motion almost immediately. Discussed low that the likelihood is means there is some degenerative tearing of the meniscus.  Will discuss again with patient at follow-up in 6 to 8 weeks.    Nonallopathic problems  Decision today to treat with OMT was based on Physical Exam  After verbal consent patient was treated with HVLA, ME, FPR techniques in cervical, rib, thoracic, lumbar, and sacral  areas  Patient tolerated the procedure well with improvement in symptoms  Patient given exercises, stretches and lifestyle modifications  See medications in patient instructions if given  Patient will follow up in 4-8 weeks     The above documentation has been reviewed and is accurate and complete Judi Saa,  DO         Note: This dictation was prepared with Dragon dictation along with smaller phrase technology. Any transcriptional errors that result from this process are unintentional.

## 2022-08-05 ENCOUNTER — Ambulatory Visit: Payer: Commercial Managed Care - PPO | Admitting: Family Medicine

## 2022-08-05 ENCOUNTER — Other Ambulatory Visit: Payer: Self-pay

## 2022-08-05 VITALS — BP 130/80 | HR 82 | Ht 62.0 in | Wt 144.0 lb

## 2022-08-05 DIAGNOSIS — M9902 Segmental and somatic dysfunction of thoracic region: Secondary | ICD-10-CM

## 2022-08-05 DIAGNOSIS — M9904 Segmental and somatic dysfunction of sacral region: Secondary | ICD-10-CM | POA: Diagnosis not present

## 2022-08-05 DIAGNOSIS — M9908 Segmental and somatic dysfunction of rib cage: Secondary | ICD-10-CM | POA: Diagnosis not present

## 2022-08-05 DIAGNOSIS — M9901 Segmental and somatic dysfunction of cervical region: Secondary | ICD-10-CM

## 2022-08-05 DIAGNOSIS — M9903 Segmental and somatic dysfunction of lumbar region: Secondary | ICD-10-CM | POA: Diagnosis not present

## 2022-08-05 DIAGNOSIS — M65321 Trigger finger, right index finger: Secondary | ICD-10-CM

## 2022-08-05 DIAGNOSIS — M23006 Cystic meniscus, unspecified meniscus, right knee: Secondary | ICD-10-CM | POA: Insufficient documentation

## 2022-08-05 NOTE — Assessment & Plan Note (Signed)
Patient given injection and tolerated the procedure well, discussed icing regimen and home exercises, patient did have improvement in range of motion almost immediately. Discussed low that the likelihood is means there is some degenerative tearing of the meniscus.  Will discuss again with patient at follow-up in 6 to 8 weeks.

## 2022-08-05 NOTE — Patient Instructions (Addendum)
Popped cyst Tart Cherry 2400mg  Call us if nay redness or swelling, but should do well See you again in 6-8 weeks

## 2022-09-10 NOTE — Progress Notes (Deleted)
[facility] [address] [place] [address] Phone: [phone] Subjective:    I'm seeing this patient by the request  of:  Burns, Bobette Mo, MD  CC:   [id number]  Sandra [name] is a 62 y.o. female coming in with complaint of back and neck pain. OMT 08/05/2022. Also f/u for R knee and R index finger pain. Patient states   Medications patient has been prescribed: Gabapentin  Taking:         Reviewed prior external information including notes and imaging from previsou exam, outside providers and external EMR if available.   As well as notes that were available from care everywhere and other healthcare systems.  Past medical history, social, surgical and family history all reviewed in electronic medical record.  No pertanent information unless stated regarding to the chief complaint.   Past Medical History:  Diagnosis Date   Anxiety    Arthritis    CAD (coronary artery disease)    nonobstructive CAD in LAD per 2013 CTA coronary morph   Cancer [facility])    hzx of uterine cancer   Cholelithiasis 04/06/2021   Depression    Diabetes mellitus    no meds taken now, checks cbg once or twice a week    Difficult intubation    Gallbladder abscess 05/02/2021   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   marginal ulcer   GERD (gastroesophageal reflux disease)    H/O gastric bypass    Hemorrhoids, internal, with bleeding and Grade 2 prolapse 03/05/2013   All positions seen an anoscopy RP columns banded 03/05/2013 LL and RA banded 03/18/2013      History of endometrial cancer 2006 ---- S/P ABD. HYSTERECTOMY   STAGE I ENDOMETROID CARCINOMA   Hx of adenomatous colonic polyps 2011   BENIGN   Hyperlipidemia    no meds now   Hypertension    off meds since 2015 per pt    Allergies  Allergen Reactions   Nsaids     History ulcers.  H/o gastric bypass   Statins     Muscle aches     Review of Systems:  No headache, visual changes, nausea, vomiting,  diarrhea, constipation, dizziness, abdominal pain, skin rash, fevers, chills, night sweats, weight loss, swollen lymph nodes, body aches, joint swelling, chest pain, shortness of breath, mood changes. POSITIVE muscle aches  Objective  There were no vitals taken for this visit.   General: No apparent distress alert and oriented x3 mood and affect normal, dressed appropriately.  HEENT: Pupils equal, extraocular movements intact  Respiratory: Patient's speak in full sentences and does not appear short of breath  Cardiovascular: No lower extremity edema, non tender, no erythema  Gait MSK:  Back   Osteopathic findings  C2 flexed rotated and side bent right C6 flexed rotated and side bent left T3 extended rotated and side bent right inhaled rib T9 extended rotated and side bent left L2 flexed rotated and side bent right Sacrum right on right       Assessment and Plan:  No problem-specific Assessment & Plan notes found for this encounter.    Nonallopathic problems  Decision today to treat with OMT was based on Physical Exam  After verbal consent patient was treated with HVLA, ME, FPR techniques in cervical, rib, thoracic, lumbar, and sacral  areas  Patient tolerated the procedure well with improvement in symptoms  Patient given exercises, stretches and lifestyle modifications  See medications in patient instructions if given  Patient  will follow up in 4-8 weeks             Note: This dictation was prepared with Dragon dictation along with smaller phrase technology. Any transcriptional errors that result from this process are unintentional.

## 2022-09-17 ENCOUNTER — Ambulatory Visit: Payer: Commercial Managed Care - PPO | Admitting: Family Medicine

## 2022-10-01 NOTE — Progress Notes (Unsigned)
[facility] [address] [place] [address] Phone: [phone] Subjective:   Bruce Donath, am serving as a scribe for Dr. Antoine Primas.  I'm seeing this patient by the request  of:  Pincus Sanes, MD  CC: Back and neck pain follow-up  [id number]  Sandra [name] is a 62 y.o. female coming in with complaint of back and neck pain. OMT 08/05/2022. Also f/u for R trigger finger. Patient states that her finger is stiff but not getting stuck.   R knee is also stiff. Back and neck pain is the same as last visit.   Started taking tart cherry extract since last visit.   Medications patient has been prescribed: Gabapentin  Taking:         Reviewed prior external information including notes and imaging from previsou exam, outside providers and external EMR if available.   As well as notes that were available from care everywhere and other healthcare systems.  Past medical history, social, surgical and family history all reviewed in electronic medical record.  No pertanent information unless stated regarding to the chief complaint.   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 HYSTERECTOMY   STAGE I ENDOMETROID CARCINOMA   Hx of adenomatous colonic polyps 2011   BENIGN   Hyperlipidemia     no meds now   Hypertension    off meds since 2015 per pt    Allergies  Allergen Reactions   Nsaids     History ulcers.  H/o gastric bypass   Statins     Muscle aches     Review of Systems:  No headache, visual changes, nausea, vomiting, diarrhea, constipation, dizziness, abdominal pain, skin rash, fevers, chills, night sweats, weight loss, swollen lymph nodes, body aches, joint swelling, chest pain, shortness of breath, mood changes. POSITIVE muscle aches  Objective  Blood pressure 128/86, pulse 65, height 5\' 2"  (1.575 m), weight 142 lb (64.4 kg), SpO2 96 %.   General: No apparent distress alert and oriented x3 mood and affect normal, dressed appropriately.  HEENT: Pupils equal, extraocular movements intact  Respiratory: Patient's speak in full sentences and does not appear short of breath  Cardiovascular: No lower extremity edema, non tender, no erythema  Neck exam tightness noted on the soft tissue bilaterally.  No tenderness to palpation and certain movements.  Negative Spurling's  Low back exam shows significant loss of lordosis.  Tightness with FABER test bilaterally right greater than left.  Osteopathic findings  C3 flexed rotated and side bent right C7 flexed rotated and side bent left T3 extended rotated and side bent right inhaled rib T9 extended rotated and side bent left L3 flexed rotated and side  bent right Sacrum right on right    Assessment and Plan:  Degenerative cervical disc  degenerative disc disease.  Discussed which activities to do and which ones to avoid.  Increase activity slowly over the course of next several weeks. discussed posture and ergonomics.  Follow-up with in 6 to 8 weeks    Nonallopathic problems  Decision today to treat with OMT was based on Physical Exam  After verbal consent patient was treated with HVLA, ME, FPR techniques in cervical, rib, thoracic, lumbar, and sacral  areas  Patient tolerated the procedure well with  improvement in symptoms  Patient given exercises, stretches and lifestyle modifications  See medications in patient instructions if given  Patient will follow up in 4-8 weeks     The above documentation has been reviewed and is accurate and complete Judi Saa, DO         Note: This dictation was prepared with Dragon dictation along with smaller phrase technology. Any transcriptional errors that result from this process are unintentional.

## 2022-10-02 ENCOUNTER — Ambulatory Visit: Payer: Commercial Managed Care - PPO | Admitting: Family Medicine

## 2022-10-02 VITALS — BP 128/86 | HR 65 | Ht 62.0 in | Wt 142.0 lb

## 2022-10-02 DIAGNOSIS — M5030 Other cervical disc degeneration, unspecified cervical region: Secondary | ICD-10-CM | POA: Diagnosis not present

## 2022-10-02 DIAGNOSIS — M9903 Segmental and somatic dysfunction of lumbar region: Secondary | ICD-10-CM | POA: Diagnosis not present

## 2022-10-02 DIAGNOSIS — M9902 Segmental and somatic dysfunction of thoracic region: Secondary | ICD-10-CM | POA: Diagnosis not present

## 2022-10-02 DIAGNOSIS — M9904 Segmental and somatic dysfunction of sacral region: Secondary | ICD-10-CM | POA: Diagnosis not present

## 2022-10-02 DIAGNOSIS — M9901 Segmental and somatic dysfunction of cervical region: Secondary | ICD-10-CM

## 2022-10-02 DIAGNOSIS — M9908 Segmental and somatic dysfunction of rib cage: Secondary | ICD-10-CM

## 2022-10-02 NOTE — Patient Instructions (Signed)
Good to see you Good luck with the grass See me in 2 months

## 2022-10-02 NOTE — Assessment & Plan Note (Signed)
degenerative disc disease.  Discussed which activities to do and which ones to avoid.  Increase activity slowly over the course of next several weeks. discussed posture and ergonomics.  Follow-up with in 6 to 8 weeks

## 2022-10-16 ENCOUNTER — Other Ambulatory Visit: Payer: Self-pay | Admitting: Family Medicine

## 2022-10-17 ENCOUNTER — Other Ambulatory Visit (HOSPITAL_COMMUNITY): Payer: Self-pay

## 2022-10-17 ENCOUNTER — Other Ambulatory Visit: Payer: Self-pay

## 2022-10-17 MED ORDER — GABAPENTIN 300 MG PO CAPS
300.0000 mg | ORAL_CAPSULE | Freq: Two times a day (BID) | ORAL | 0 refills | Status: DC
  Filled 2022-10-17: qty 180, 90d supply, fill #0

## 2022-12-05 ENCOUNTER — Encounter: Payer: 59 | Admitting: Internal Medicine

## 2022-12-05 NOTE — Progress Notes (Deleted)
[facility] [address] [place] [address] Phone: [phone] Subjective:    I'm seeing this patient by the request  of:  Burns, Bobette Mo, MD  CC:   [id number]  Sandra [name] is a 62 y.o. female coming in with complaint of back and neck pain. OMT 10/02/2022. Patient states   Medications patient has been prescribed: Gabapentin  Taking:         Reviewed prior external information including notes and imaging from previsou exam, outside providers and external EMR if available.   As well as notes that were available from care everywhere and other healthcare systems.  Past medical history, social, surgical and family history all reviewed in electronic medical record.  No pertanent information unless stated regarding to the chief complaint.   Past Medical History:  Diagnosis Date   Anxiety    Arthritis    CAD (coronary artery disease)    nonobstructive CAD in LAD per 2013 CTA coronary morph   Cancer [facility])    hzx of uterine cancer   Cholelithiasis 04/06/2021   Depression    Diabetes mellitus    no meds taken now, checks cbg once or twice a week    Difficult intubation    Gallbladder abscess 05/02/2021   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   marginal ulcer   GERD (gastroesophageal reflux disease)    H/O gastric bypass    Hemorrhoids, internal, with bleeding and Grade 2 prolapse 03/05/2013   All positions seen an anoscopy RP columns banded 03/05/2013 LL and RA banded 03/18/2013      History of endometrial cancer 2006 ---- S/P ABD. HYSTERECTOMY   STAGE I ENDOMETROID CARCINOMA   Hx of adenomatous colonic polyps 2011   BENIGN   Hyperlipidemia    no meds now   Hypertension    off meds since 2015 per pt    Allergies  Allergen Reactions   Nsaids     History ulcers.  H/o gastric bypass   Statins     Muscle aches     Review of Systems:  No headache, visual changes, nausea, vomiting, diarrhea, constipation, dizziness, abdominal  pain, skin rash, fevers, chills, night sweats, weight loss, swollen lymph nodes, body aches, joint swelling, chest pain, shortness of breath, mood changes. POSITIVE muscle aches  Objective  There were no vitals taken for this visit.   General: No apparent distress alert and oriented x3 mood and affect normal, dressed appropriately.  HEENT: Pupils equal, extraocular movements intact  Respiratory: Patient's speak in full sentences and does not appear short of breath  Cardiovascular: No lower extremity edema, non tender, no erythema  Gait MSK:  Back   Osteopathic findings  C2 flexed rotated and side bent right C6 flexed rotated and side bent left T3 extended rotated and side bent right inhaled rib T9 extended rotated and side bent left L2 flexed rotated and side bent right Sacrum right on right       Assessment and Plan:  No problem-specific Assessment & Plan notes found for this encounter.    Nonallopathic problems  Decision today to treat with OMT was based on Physical Exam  After verbal consent patient was treated with HVLA, ME, FPR techniques in cervical, rib, thoracic, lumbar, and sacral  areas  Patient tolerated the procedure well with improvement in symptoms  Patient given exercises, stretches and lifestyle modifications  See medications in patient instructions if given  Patient will follow up in 4-8 weeks    The  above documentation has been reviewed and is accurate and complete Judi Saa, DO          Note: This dictation was prepared with Dragon dictation along with smaller phrase technology. Any transcriptional errors that result from this process are unintentional.

## 2022-12-08 ENCOUNTER — Encounter: Payer: Self-pay | Admitting: Internal Medicine

## 2022-12-08 NOTE — Progress Notes (Signed)
Subjective:    Patient ID: Sandra [name], female    DOB: Jan 30, 1961, 62 y.o.   MRN: [id number]      HPI Sandra [name] is here for a Physical exam and her chronic medical problems.   ?  Still with anxiety-was on BuSpar   Medications and allergies reviewed with patient and updated if appropriate.  Current Outpatient Medications on File Prior to Visit  Medication Sig Dispense Refill  . acetaminophen (TYLENOL) 500 MG tablet Take 500 mg by mouth every 6 (six) hours as needed for moderate pain.    . COLLAGEN PO Take 1 Scoop by mouth daily.    Marland Kitchen glucose blood (FREESTYLE LITE) test strip Use daily as instructed. 100 each 12  . Lancets (FREESTYLE) lancets Use 1 lancet up to 4 times daily to check blood glucose as directed. 100 each 0  . MELATONIN PO Take 1 tablet by mouth at bedtime as needed (sleep).    . Multiple Vitamins-Minerals (MULTIVITAMIN PO) Take 2 tablets by mouth daily.     Marland Kitchen omeprazole (PRILOSEC) 40 MG capsule Take 1 capsule by mouth 2 times daily before a meal. Open capsule and sprinkle in water, other liquid or applesauce (Patient taking differently: Take 40 mg by mouth at bedtime.) 180 capsule 1  . ondansetron (ZOFRAN-ODT) 4 MG disintegrating tablet DISSOLVE 1 TABLET BY MOUTH EVERY 8 HOURS AS NEEDED FOR NAUSEA OR VOMITING (MAY TAKE 2 TABLETS IF 1 TABLET IS INEFFECTIVE) (Patient not taking: Reported on 01/13/2023) 30 tablet 3  . polyethylene glycol powder (MIRALAX) 17 GM/SCOOP powder Take 17 g by mouth daily. (Patient taking differently: Take 17 g by mouth daily as needed for moderate constipation.) 238 g 0   No current facility-administered medications on file prior to visit.    Review of Systems     Objective:  There were no vitals filed for this visit. There were no vitals filed for this visit. There is no height or weight on file to calculate BMI.  BP Readings from Last 3 Encounters:  12/20/22 116/60  12/12/22 128/82  10/02/22 128/86    Wt Readings from Last 3  Encounters:  01/13/23 144 lb (65.3 kg)  12/20/22 144 lb (65.3 kg)  12/12/22 145 lb (65.8 kg)       Physical Exam Constitutional: She appears well-developed and well-nourished. No distress.  HENT:  Head: Normocephalic and atraumatic.  Right Ear: External ear normal. Normal ear canal and TM Left Ear: External ear normal.  Normal ear canal and TM Mouth/Throat: Oropharynx is clear and moist.  Eyes: Conjunctivae normal.  Neck: Neck supple. No tracheal deviation present. No thyromegaly present.  No carotid bruit  Cardiovascular: Normal rate, regular rhythm and normal heart sounds.   No murmur heard.  No edema. Pulmonary/Chest: Effort normal and breath sounds normal. No respiratory distress. She has no wheezes. She has no rales.  Breast: deferred   Abdominal: Soft. She exhibits no distension. There is no tenderness.  Lymphadenopathy: She has no cervical adenopathy.  Skin: Skin is warm and dry. She is not diaphoretic.  Psychiatric: She has a normal mood and affect. Her behavior is normal.     Lab Results  Component Value Date   WBC 8.5 12/20/2022   HGB 14.2 12/20/2022   HCT 43.9 12/20/2022   PLT 268.0 12/20/2022   GLUCOSE 138 (H) 12/20/2022   CHOL 133 12/20/2022   TRIG 168.0 (H) 12/20/2022   HDL 44.90 12/20/2022   LDLDIRECT 95.0 11/13/2018   LDLCALC 54 12/20/2022  ALT 39 (H) 12/20/2022   AST 28 12/20/2022   NA 143 12/20/2022   K 4.0 12/20/2022   CL 105 12/20/2022   CREATININE 0.60 12/20/2022   BUN 20 12/20/2022   CO2 31 12/20/2022   TSH 2.56 12/20/2022   INR 1.2 03/06/2021   HGBA1C 6.3 12/20/2022   MICROALBUR 0.9 12/20/2022         Assessment & Plan:   Physical exam: Screening blood work  ordered Exercise   Weight   Substance abuse  none   Reviewed recommended immunizations.   Health Maintenance  Topic Date Due  . OPHTHALMOLOGY EXAM  05/30/2019  . FOOT EXAM  10/20/2020  . COVID-19 Vaccine (1 - 2023-24 season) Never done  . MAMMOGRAM  01/31/2023  .  INFLUENZA VACCINE  07/28/2023 (Originally 11/28/2022)  . HEMOGLOBIN A1C  06/22/2023  . Cervical Cancer Screening (HPV/Pap Cotest)  08/25/2023  . DEXA SCAN  12/05/2023  . Diabetic kidney evaluation - eGFR measurement  12/20/2023  . Diabetic kidney evaluation - Urine ACR  12/20/2023  . Colonoscopy  07/25/2029  . DTaP/Tdap/Td (3 - Td or Tdap) 12/19/2032  . Hepatitis C Screening  Completed  . HIV Screening  Completed  . HPV VACCINES  Aged Out  . Zoster Vaccines- Shingrix  Discontinued          See Problem List for Assessment and Plan of chronic medical problems.     This encounter was created in error - please disregard.

## 2022-12-08 NOTE — Patient Instructions (Addendum)
Blood work was ordered.   The lab is on the first floor.    Medications changes include :   None.    A referral was ordered and someone will call you to schedule an appointment.     Return in about 6 months (around 06/11/2023) for follow up.   Health Maintenance, Female Adopting a healthy lifestyle and getting preventive care are important in promoting health and wellness. Ask your health care provider about: The right schedule for you to have regular tests and exams. Things you can do on your own to prevent diseases and keep yourself healthy. What should I know about diet, weight, and exercise? Eat a healthy diet  Eat a diet that includes plenty of vegetables, fruits, low-fat dairy products, and lean protein. Do not eat a lot of foods that are high in solid fats, added sugars, or sodium. Maintain a healthy weight Body mass index (BMI) is used to identify weight problems. It estimates body fat based on height and weight. Your health care provider can help determine your BMI and help you achieve or maintain a healthy weight. Get regular exercise Get regular exercise. This is one of the most important things you can do for your health. Most adults should: Exercise for at least 150 minutes each week. The exercise should increase your heart rate and make you sweat (moderate-intensity exercise). Do strengthening exercises at least twice a week. This is in addition to the moderate-intensity exercise. Spend less time sitting. Even light physical activity can be beneficial. Watch cholesterol and blood lipids Have your blood tested for lipids and cholesterol at 63 years of age, then have this test every 5 years. Have your cholesterol levels checked more often if: Your lipid or cholesterol levels are high. You are older than 63 years of age. You are at high risk for heart disease. What should I know about cancer screening? Depending on your health history and family history, you  may need to have cancer screening at various ages. This may include screening for: Breast cancer. Cervical cancer. Colorectal cancer. Skin cancer. Lung cancer. What should I know about heart disease, diabetes, and high blood pressure? Blood pressure and heart disease High blood pressure causes heart disease and increases the risk of stroke. This is more likely to develop in people who have high blood pressure readings or are overweight. Have your blood pressure checked: Every 3-5 years if you are 53-43 years of age. Every year if you are 80 years old or older. Diabetes Have regular diabetes screenings. This checks your fasting blood sugar level. Have the screening done: Once every three years after age 65 if you are at a normal weight and have a low risk for diabetes. More often and at a younger age if you are overweight or have a high risk for diabetes. What should I know about preventing infection? Hepatitis B If you have a higher risk for hepatitis B, you should be screened for this virus. Talk with your health care provider to find out if you are at risk for hepatitis B infection. Hepatitis C Testing is recommended for: Everyone born from [birth year] through 1966. Anyone with known risk factors for hepatitis C. Sexually transmitted infections (STIs) Get screened for STIs, including gonorrhea and chlamydia, if: You are sexually active and are younger than 63 years of age. You are older than 63 years of age and your health care provider tells you that you are at risk for this type  of infection. Your sexual activity has changed since you were last screened, and you are at increased risk for chlamydia or gonorrhea. Ask your health care provider if you are at risk. Ask your health care provider about whether you are at high risk for HIV. Your health care provider may recommend a prescription medicine to help prevent HIV infection. If you choose to take medicine to prevent HIV, you should first  get tested for HIV. You should then be tested every 3 months for as long as you are taking the medicine. Pregnancy If you are about to stop having your period (premenopausal) and you may become pregnant, seek counseling before you get pregnant. Take 400 to 800 micrograms (mcg) of folic acid every day if you become pregnant. Ask for birth control (contraception) if you want to prevent pregnancy. Osteoporosis and menopause Osteoporosis is a disease in which the bones lose minerals and strength with aging. This can result in bone fractures. If you are 55 years old or older, or if you are at risk for osteoporosis and fractures, ask your health care provider if you should: Be screened for bone loss. Take a calcium or vitamin D supplement to lower your risk of fractures. Be given hormone replacement therapy (HRT) to treat symptoms of menopause. Follow these instructions at home: Alcohol use Do not drink alcohol if: Your health care provider tells you not to drink. You are pregnant, may be pregnant, or are planning to become pregnant. If you drink alcohol: Limit how much you have to: 0-1 drink a day. Know how much alcohol is in your drink. In the [place] one drink equals one 12 oz bottle of beer (355 mL), one 5 oz glass of wine (148 mL), or one 1 oz glass of hard liquor (44 mL). Lifestyle Do not use any products that contain nicotine or tobacco. These products include cigarettes, chewing tobacco, and vaping devices, such as e-cigarettes. If you need help quitting, ask your health care provider. Do not use street drugs. Do not share needles. Ask your health care provider for help if you need support or information about quitting drugs. General instructions Schedule regular health, dental, and eye exams. Stay current with your vaccines. Tell your health care provider if: You often feel depressed. You have ever been abused or do not feel safe at home. Summary Adopting a healthy lifestyle and  getting preventive care are important in promoting health and wellness. Follow your health care provider's instructions about healthy diet, exercising, and getting tested or screened for diseases. Follow your health care provider's instructions on monitoring your cholesterol and blood pressure. This information is not intended to replace advice given to you by your health care provider. Make sure you discuss any questions you have with your health care provider. Document Revised: 09/04/2020 Document Reviewed: 09/04/2020 Elsevier Patient Education  2024 [employer].

## 2022-12-09 ENCOUNTER — Encounter: Payer: 59 | Admitting: Internal Medicine

## 2022-12-09 ENCOUNTER — Ambulatory Visit: Payer: Commercial Managed Care - PPO | Admitting: Family Medicine

## 2022-12-09 DIAGNOSIS — F419 Anxiety disorder, unspecified: Secondary | ICD-10-CM

## 2022-12-09 DIAGNOSIS — Z0000 Encounter for general adult medical examination without abnormal findings: Secondary | ICD-10-CM

## 2022-12-09 DIAGNOSIS — E7800 Pure hypercholesterolemia, unspecified: Secondary | ICD-10-CM

## 2022-12-09 DIAGNOSIS — M85852 Other specified disorders of bone density and structure, left thigh: Secondary | ICD-10-CM

## 2022-12-09 DIAGNOSIS — E119 Type 2 diabetes mellitus without complications: Secondary | ICD-10-CM

## 2022-12-09 DIAGNOSIS — I2510 Atherosclerotic heart disease of native coronary artery without angina pectoris: Secondary | ICD-10-CM

## 2022-12-09 NOTE — Assessment & Plan Note (Signed)
Chronic DEXA up-to-date Stressed regular exercise Encouraged good calcium intake with supplementation and diet Encouraged vitamin D supplementation daily.

## 2022-12-09 NOTE — Assessment & Plan Note (Signed)
Chronic   Lab Results  Component Value Date   HGBA1C 5.8 (H) 03/18/2022   Sugars well controlled Check A1c, urine microalbumin today Continue lifestyle control Stressed regular exercise, diabetic diet

## 2022-12-09 NOTE — Assessment & Plan Note (Signed)
Chronic Regular exercise and healthy diet encouraged Check lipid panel  Continue lifestyle control 

## 2022-12-09 NOTE — Assessment & Plan Note (Signed)
Chronic Nonobstructive CAD by coronary CTA-2013 No symptoms consistent with angina Check lipid panel

## 2022-12-10 NOTE — Progress Notes (Unsigned)
[facility] [address] [place] [address] Phone: [phone] Subjective:   Sandra [name], am serving as a scribe for Dr. Antoine Primas.  I'm seeing this patient by the request  of:  Pincus Sanes, MD  CC: Right knee, finger pain, back pain and neck pain  [id number]  Jenae Maryagnes Carrasco is a 62 y.o. female coming in with complaint of back and neck pain. OMT 10/02/2022. Patient states that her R hand 2nd finger MCP pain and R knee pain has increased recently. Work exacerbates her pain. Painful to flex knee especially at night.   Taking tart cherry since last visit. Willing to try medication for gout.   Medications patient has been prescribed: Gabapentin  Taking: Yes         Reviewed prior external information including notes and imaging from previsou exam, outside providers and external EMR if available.   As well as notes that were available from care everywhere and other healthcare systems.  Past medical history, social, surgical and family history all reviewed in electronic medical record.  No pertanent information unless stated regarding to the chief complaint.   Past Medical History:  Diagnosis Date   Anxiety    Arthritis    CAD (coronary artery disease)    nonobstructive CAD in LAD per 2013 CTA coronary morph   Cancer [facility])    hzx of uterine cancer   Cholelithiasis 04/06/2021   Depression    Diabetes mellitus    no meds taken now, checks cbg once or twice a week    Difficult intubation    Gallbladder abscess 05/02/2021   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   marginal ulcer   GERD (gastroesophageal reflux disease)    H/O gastric bypass    Hemorrhoids, internal, with bleeding and Grade 2 prolapse 03/05/2013   All positions seen an anoscopy RP columns banded 03/05/2013 LL and RA banded 03/18/2013      History of endometrial cancer 2006 ---- S/P ABD. HYSTERECTOMY   STAGE I ENDOMETROID CARCINOMA   Hx of adenomatous colonic  polyps 2011   BENIGN   Hyperlipidemia    no meds now   Hypertension    off meds since 2015 per pt    Allergies  Allergen Reactions   Nsaids     History ulcers.  H/o gastric bypass   Statins     Muscle aches     Review of Systems:  No headache, visual changes, nausea, vomiting, diarrhea, constipation, dizziness, abdominal pain, skin rash, fevers, chills, night sweats, weight loss, swollen lymph nodes, body aches, joint swelling, chest pain, shortness of breath, mood changes. POSITIVE muscle aches  Objective  Blood pressure 128/82, pulse 68, height 5\' 2"  (1.575 m), weight 145 lb (65.8 kg), SpO2 97%.   General: No apparent distress alert and oriented x3 mood and affect normal, dressed appropriately.  HEENT: Pupils equal, extraocular movements intact  Respiratory: Patient's speak in full sentences and does not appear short of breath  Cardiovascular: No lower extremity edema, non tender, no erythema  Right hand does have a trigger nodule noted at the A2 pulley of the index finger.  Extremities again tender to palpation.  Mild swelling in the hands bilaterally. Right knee does have some crepitus noted no significant instability noted today.  Osteopathic findings  C6 flexed rotated and side bent left T3 extended rotated and side bent right inhaled rib L2 flexed rotated and side bent right Sacrum right on right   Procedure: Real-time  Ultrasound Guided Injection of right index flexor tendon sheath Device: GE Logiq Q7 Ultrasound guided injection is preferred based studies that show increased duration, increased effect, greater accuracy, decreased procedural pain, increased response rate, and decreased cost with ultrasound guided versus blind injection.  Verbal informed consent obtained.  Time-out conducted.  Noted no overlying erythema, induration, or other signs of local infection.  Skin prepped in a sterile fashion.  Local anesthesia: Topical Ethyl chloride.  With sterile  technique and under real time ultrasound guidance: With a 25-gauge half inch needle injected with 0.5 cc of 0.5% Marcaine and 0.5 cc of Kenalog 40 mg/mL Completed without difficulty  Pain immediately improved suggesting accurate placement of the medication.  Advised to call if fevers/chills, erythema, induration, drainage, or persistent bleeding.  Impression: Technically successful ultrasound guided injection.    Assessment and Plan:  Trigger finger, right index finger Chronic problem with worsening symptoms again.  Patient has had this intermittently for quite some time.  Almost a year since the last injection.  Hopefully this will make improvement.  Discussed potential bracing at night.  Will follow-up again in 6 to 8 weeks  Degenerative cervical disc Has responded relatively well to osteopathic manipulation.  Do avoid HVLA on the neck.  Discussed posture and ergonomics.  Follow-up again in 6 to 8 weeks  Degenerative arthritis of right knee Known arthritis of the knee that we will monitor closely.  Follow-up again in 6 to 8 weeks otherwise.    Nonallopathic problems  Decision today to treat with OMT was based on Physical Exam  After verbal consent patient was treated with , ME, FPR techniques in cervical, rib, thoracic, lumbar, and sacral  areas  Patient tolerated the procedure well with improvement in symptoms  Patient given exercises, stretches and lifestyle modifications  See medications in patient instructions if given  Patient will follow up in 4-8 weeks             Note: This dictation was prepared with Dragon dictation along with smaller phrase technology. Any transcriptional errors that result from this process are unintentional.

## 2022-12-11 ENCOUNTER — Ambulatory Visit (INDEPENDENT_AMBULATORY_CARE_PROVIDER_SITE_OTHER): Payer: Commercial Managed Care - PPO | Admitting: Orthopaedic Surgery

## 2022-12-11 ENCOUNTER — Encounter: Payer: Self-pay | Admitting: Orthopaedic Surgery

## 2022-12-11 ENCOUNTER — Other Ambulatory Visit (INDEPENDENT_AMBULATORY_CARE_PROVIDER_SITE_OTHER): Payer: Commercial Managed Care - PPO

## 2022-12-11 DIAGNOSIS — Z96641 Presence of right artificial hip joint: Secondary | ICD-10-CM

## 2022-12-11 NOTE — Progress Notes (Signed)
The patient is now just over a month status post a right total hip arthroplasty.  She is doing great and has no issues with right hip at all.  She reports good range of motion and strength.  She is walking with a normal-appearing gait.  She is only 62 years old and thin.  Her right and left hips move smoothly and fluidly.  She still does deal with some right knee issues.  An AP pelvis and lateral the right hip shows a well-seated total hip arthroplasty with no complicating features.  At this point follow-up for right hip can be as needed.  She knows that things that need to bring her back for that hip if there are issues.  If she does develop any issues with her right knee that are worsening she knows to let us know.  All questions and concerns were answered and addressed.

## 2022-12-12 ENCOUNTER — Encounter: Payer: Self-pay | Admitting: Family Medicine

## 2022-12-12 ENCOUNTER — Other Ambulatory Visit (HOSPITAL_COMMUNITY): Payer: Self-pay

## 2022-12-12 ENCOUNTER — Ambulatory Visit (INDEPENDENT_AMBULATORY_CARE_PROVIDER_SITE_OTHER): Payer: Commercial Managed Care - PPO | Admitting: Family Medicine

## 2022-12-12 ENCOUNTER — Other Ambulatory Visit: Payer: Self-pay

## 2022-12-12 VITALS — BP 128/82 | HR 68 | Ht 62.0 in | Wt 145.0 lb

## 2022-12-12 DIAGNOSIS — M65321 Trigger finger, right index finger: Secondary | ICD-10-CM

## 2022-12-12 DIAGNOSIS — M9902 Segmental and somatic dysfunction of thoracic region: Secondary | ICD-10-CM | POA: Diagnosis not present

## 2022-12-12 DIAGNOSIS — M9904 Segmental and somatic dysfunction of sacral region: Secondary | ICD-10-CM | POA: Diagnosis not present

## 2022-12-12 DIAGNOSIS — M79641 Pain in right hand: Secondary | ICD-10-CM

## 2022-12-12 DIAGNOSIS — M9908 Segmental and somatic dysfunction of rib cage: Secondary | ICD-10-CM | POA: Diagnosis not present

## 2022-12-12 DIAGNOSIS — M5030 Other cervical disc degeneration, unspecified cervical region: Secondary | ICD-10-CM

## 2022-12-12 DIAGNOSIS — M9901 Segmental and somatic dysfunction of cervical region: Secondary | ICD-10-CM | POA: Diagnosis not present

## 2022-12-12 DIAGNOSIS — M1711 Unilateral primary osteoarthritis, right knee: Secondary | ICD-10-CM

## 2022-12-12 DIAGNOSIS — M9903 Segmental and somatic dysfunction of lumbar region: Secondary | ICD-10-CM

## 2022-12-12 MED ORDER — PREDNISONE 20 MG PO TABS
20.0000 mg | ORAL_TABLET | Freq: Every day | ORAL | 0 refills | Status: DC
  Filled 2022-12-12: qty 5, 5d supply, fill #0

## 2022-12-12 MED ORDER — ALLOPURINOL 100 MG PO TABS
200.0000 mg | ORAL_TABLET | Freq: Every day | ORAL | 0 refills | Status: DC
  Filled 2022-12-12: qty 60, 30d supply, fill #0

## 2022-12-12 NOTE — Assessment & Plan Note (Signed)
Known arthritis of the knee that we will monitor closely.  Follow-up again in 6 to 8 weeks otherwise.

## 2022-12-12 NOTE — Assessment & Plan Note (Addendum)
Has responded relatively well to osteopathic manipulation.  Do avoid HVLA on the neck.  Discussed posture and ergonomics.  Follow-up again in 6 to 8 weeks think there could be a potential underlying gout as well and started on allopurinol and prednisone.

## 2022-12-12 NOTE — Assessment & Plan Note (Signed)
Chronic problem with worsening symptoms again.  Patient has had this intermittently for quite some time.  Almost a year since the last injection.  Hopefully this will make improvement.  Discussed potential bracing at night.  Will follow-up again in 6 to 8 weeks

## 2022-12-12 NOTE — Patient Instructions (Signed)
Injection in hand Allopurinol 200mg   Prednisone 20mg  for 5 days See me again in 2 months

## 2022-12-14 ENCOUNTER — Other Ambulatory Visit (HOSPITAL_COMMUNITY): Payer: Self-pay

## 2022-12-19 ENCOUNTER — Encounter: Payer: Self-pay | Admitting: Internal Medicine

## 2022-12-19 NOTE — Progress Notes (Signed)
Subjective:    Patient ID: Sandra [name], female    DOB: Nov 27, 1960, 62 y.o.   MRN: [id number]      HPI Sandra [name] is here for a Physical exam and her chronic medical problems.    Has fairly frequent swimmy headed-not sure the cause.  Often it is not related to head movements and will just happen very transiently and was sitting.  She knows she does not always drink enough fluids   Medications and allergies reviewed with patient and updated if appropriate.  Current Outpatient Medications on File Prior to Visit  Medication Sig Dispense Refill   acetaminophen (TYLENOL) 500 MG tablet Take 500 mg by mouth every 6 (six) hours as needed for moderate pain.     allopurinol (ZYLOPRIM) 100 MG tablet Take 2 tablets (200 mg total) by mouth daily. 60 tablet 0   COLLAGEN PO Take 1 Scoop by mouth daily.     gabapentin (NEURONTIN) 300 MG capsule Take 1 capsule (300 mg total) by mouth 2 (two) times daily. 180 capsule 0   glucose blood (FREESTYLE LITE) test strip Use daily as instructed. 100 each 12   hydrocortisone (ANUSOL-HC) 25 MG suppository Insert rectally 2 times a day as needed 12 suppository 1   Lancets (FREESTYLE) lancets Use 1 lancet up to 4 times daily to check blood glucose as directed. 100 each 0   MELATONIN PO Take 1 tablet by mouth at bedtime as needed (sleep).     Multiple Vitamins-Minerals (MULTIVITAMIN PO) Take 2 tablets by mouth daily.      omeprazole (PRILOSEC) 40 MG capsule Take 1 capsule by mouth 2 times daily before a meal. Open capsule and sprinkle in water, other liquid or applesauce (Patient taking differently: Take 40 mg by mouth at bedtime.) 180 capsule 1   ondansetron (ZOFRAN-ODT) 4 MG disintegrating tablet DISSOLVE 1 TABLET BY MOUTH EVERY 8 HOURS AS NEEDED FOR NAUSEA OR VOMITING (MAY TAKE 2 TABLETS IF 1 TABLET IS INEFFECTIVE) 30 tablet 3   polyethylene glycol powder (MIRALAX) 17 GM/SCOOP powder Take 17 g by mouth daily. (Patient taking differently: Take 17 g by mouth  daily as needed for moderate constipation.) 238 g 0   predniSONE (DELTASONE) 20 MG tablet Take 1 tablet (20 mg total) by mouth daily with breakfast. 5 tablet 0   No current facility-administered medications on file prior to visit.    Review of Systems  Constitutional:  Negative for fever.  Eyes:  Negative for visual disturbance.  Respiratory:  Negative for cough, shortness of breath and wheezing.   Cardiovascular:  Negative for chest pain, palpitations and leg swelling.  Gastrointestinal:  Positive for constipation (miralax). Negative for abdominal pain, blood in stool and diarrhea.       No gerd  Genitourinary:  Negative for dysuria.  Musculoskeletal:  Positive for arthralgias and back pain.  Skin:  Negative for rash.  Neurological:  Positive for dizziness and light-headedness. Negative for headaches.  Psychiatric/Behavioral:  Negative for dysphoric mood. The patient is nervous/anxious.        Objective:   Vitals:   12/20/22 0811  BP: 116/60  Pulse: 63  Temp: 98.6 F (37 C)  SpO2: 95%   Filed Weights   12/20/22 0811  Weight: 144 lb (65.3 kg)   Body mass index is 26.34 kg/m.  BP Readings from Last 3 Encounters:  12/20/22 116/60  12/12/22 128/82  10/02/22 128/86    Wt Readings from Last 3 Encounters:  12/20/22 144 lb (65.3 kg)  12/12/22 145 lb (65.8 kg)  10/02/22 142 lb (64.4 kg)       Physical Exam Constitutional: She appears well-developed and well-nourished. No distress.  HENT:  Head: Normocephalic and atraumatic.  Right Ear: External ear normal. Normal ear canal and TM Left Ear: External ear normal.  Normal ear canal and TM Mouth/Throat: Oropharynx is clear and moist.  Eyes: Conjunctivae normal.  Neck: Neck supple. No tracheal deviation present. No thyromegaly present.  No carotid bruit  Cardiovascular: Normal rate, regular rhythm and normal heart sounds.   No murmur heard.  No edema. Pulmonary/Chest: Effort normal and breath sounds normal. No  respiratory distress. She has no wheezes. She has no rales.  Breast: deferred   Abdominal: Soft. She exhibits no distension. There is no tenderness.  Lymphadenopathy: She has no cervical adenopathy.  Skin: Skin is warm and dry. She is not diaphoretic.  Psychiatric: She has a normal mood and affect. Her behavior is normal.     Lab Results  Component Value Date   WBC 11.8 (H) 03/30/2022   HGB 9.9 (L) 03/30/2022   HCT 30.6 (L) 03/30/2022   PLT 172 03/30/2022   GLUCOSE 138 (H) 03/30/2022   CHOL 165 12/03/2021   TRIG 182.0 (H) 12/03/2021   HDL 52.40 12/03/2021   LDLDIRECT 95.0 11/13/2018   LDLCALC 76 12/03/2021   ALT 21 12/03/2021   AST 23 12/03/2021   NA 140 03/30/2022   K 3.8 03/30/2022   CL 108 03/30/2022   CREATININE 0.55 03/30/2022   BUN 15 03/30/2022   CO2 28 03/30/2022   TSH 2.80 12/03/2021   INR 1.2 03/06/2021   HGBA1C 5.8 (H) 03/18/2022   MICROALBUR 1.3 12/03/2021         Assessment & Plan:   Physical exam: Screening blood work  ordered Exercise  swimming, walking, active Weight  is good  Substance abuse  none   Reviewed recommended immunizations.  Tdap today   Health Maintenance  Topic Date Due   OPHTHALMOLOGY EXAM  05/30/2019   FOOT EXAM  10/20/2020   PAP SMEAR-Modifier  08/24/2021   COVID-19 Vaccine (1 - 2023-24 season) Never done   DTaP/Tdap/Td (2 - Td or Tdap) 09/02/2022   HEMOGLOBIN A1C  09/16/2022   Diabetic kidney evaluation - Urine ACR  12/04/2022   INFLUENZA VACCINE  07/28/2023 (Originally 11/28/2022)   MAMMOGRAM  01/31/2023   Diabetic kidney evaluation - eGFR measurement  03/31/2023   DEXA SCAN  12/05/2023   Colonoscopy  07/25/2029   Hepatitis C Screening  Completed   HIV Screening  Completed   HPV VACCINES  Aged Out   Zoster Vaccines- Shingrix  Discontinued          See Problem List for Assessment and Plan of chronic medical problems.

## 2022-12-19 NOTE — Patient Instructions (Addendum)
Tetanus vaccine given.    Blood work was ordered.   The lab is on the first floor.    Medications changes include :   none    Return in about 6 months (around 06/22/2023) for follow up.   Health Maintenance, Female Adopting a healthy lifestyle and getting preventive care are important in promoting health and wellness. Ask your health care provider about: The right schedule for you to have regular tests and exams. Things you can do on your own to prevent diseases and keep yourself healthy. What should I know about diet, weight, and exercise? Eat a healthy diet  Eat a diet that includes plenty of vegetables, fruits, low-fat dairy products, and lean protein. Do not eat a lot of foods that are high in solid fats, added sugars, or sodium. Maintain a healthy weight Body mass index (BMI) is used to identify weight problems. It estimates body fat based on height and weight. Your health care provider can help determine your BMI and help you achieve or maintain a healthy weight. Get regular exercise Get regular exercise. This is one of the most important things you can do for your health. Most adults should: Exercise for at least 150 minutes each week. The exercise should increase your heart rate and make you sweat (moderate-intensity exercise). Do strengthening exercises at least twice a week. This is in addition to the moderate-intensity exercise. Spend less time sitting. Even light physical activity can be beneficial. Watch cholesterol and blood lipids Have your blood tested for lipids and cholesterol at 63 years of age, then have this test every 5 years. Have your cholesterol levels checked more often if: Your lipid or cholesterol levels are high. You are older than 63 years of age. You are at high risk for heart disease. What should I know about cancer screening? Depending on your health history and family history, you may need to have cancer screening at various ages. This may  include screening for: Breast cancer. Cervical cancer. Colorectal cancer. Skin cancer. Lung cancer. What should I know about heart disease, diabetes, and high blood pressure? Blood pressure and heart disease High blood pressure causes heart disease and increases the risk of stroke. This is more likely to develop in people who have high blood pressure readings or are overweight. Have your blood pressure checked: Every 3-5 years if you are 59-5 years of age. Every year if you are 52 years old or older. Diabetes Have regular diabetes screenings. This checks your fasting blood sugar level. Have the screening done: Once every three years after age 29 if you are at a normal weight and have a low risk for diabetes. More often and at a younger age if you are overweight or have a high risk for diabetes. What should I know about preventing infection? Hepatitis B If you have a higher risk for hepatitis B, you should be screened for this virus. Talk with your health care provider to find out if you are at risk for hepatitis B infection. Hepatitis C Testing is recommended for: Everyone born from [birth year] through 1965. Anyone with known risk factors for hepatitis C. Sexually transmitted infections (STIs) Get screened for STIs, including gonorrhea and chlamydia, if: You are sexually active and are younger than 63 years of age. You are older than 63 years of age and your health care provider tells you that you are at risk for this type of infection. Your sexual activity has changed since you were last screened,  and you are at increased risk for chlamydia or gonorrhea. Ask your health care provider if you are at risk. Ask your health care provider about whether you are at high risk for HIV. Your health care provider may recommend a prescription medicine to help prevent HIV infection. If you choose to take medicine to prevent HIV, you should first get tested for HIV. You should then be tested every 3 months  for as long as you are taking the medicine. Pregnancy If you are about to stop having your period (premenopausal) and you may become pregnant, seek counseling before you get pregnant. Take 400 to 800 micrograms (mcg) of folic acid every day if you become pregnant. Ask for birth control (contraception) if you want to prevent pregnancy. Osteoporosis and menopause Osteoporosis is a disease in which the bones lose minerals and strength with aging. This can result in bone fractures. If you are 6 years old or older, or if you are at risk for osteoporosis and fractures, ask your health care provider if you should: Be screened for bone loss. Take a calcium or vitamin D supplement to lower your risk of fractures. Be given hormone replacement therapy (HRT) to treat symptoms of menopause. Follow these instructions at home: Alcohol use Do not drink alcohol if: Your health care provider tells you not to drink. You are pregnant, may be pregnant, or are planning to become pregnant. If you drink alcohol: Limit how much you have to: 0-1 drink a day. Know how much alcohol is in your drink. In the [place] one drink equals one 12 oz bottle of beer (355 mL), one 5 oz glass of wine (148 mL), or one 1 oz glass of hard liquor (44 mL). Lifestyle Do not use any products that contain nicotine or tobacco. These products include cigarettes, chewing tobacco, and vaping devices, such as e-cigarettes. If you need help quitting, ask your health care provider. Do not use street drugs. Do not share needles. Ask your health care provider for help if you need support or information about quitting drugs. General instructions Schedule regular health, dental, and eye exams. Stay current with your vaccines. Tell your health care provider if: You often feel depressed. You have ever been abused or do not feel safe at home. Summary Adopting a healthy lifestyle and getting preventive care are important in promoting health and  wellness. Follow your health care provider's instructions about healthy diet, exercising, and getting tested or screened for diseases. Follow your health care provider's instructions on monitoring your cholesterol and blood pressure. This information is not intended to replace advice given to you by your health care provider. Make sure you discuss any questions you have with your health care provider. Document Revised: 09/04/2020 Document Reviewed: 09/04/2020 Elsevier Patient Education  2024 [employer].

## 2022-12-20 ENCOUNTER — Ambulatory Visit (INDEPENDENT_AMBULATORY_CARE_PROVIDER_SITE_OTHER): Payer: Commercial Managed Care - PPO | Admitting: Internal Medicine

## 2022-12-20 VITALS — BP 116/60 | HR 63 | Temp 98.6°F | Ht 62.0 in | Wt 144.0 lb

## 2022-12-20 DIAGNOSIS — I2510 Atherosclerotic heart disease of native coronary artery without angina pectoris: Secondary | ICD-10-CM

## 2022-12-20 DIAGNOSIS — M85851 Other specified disorders of bone density and structure, right thigh: Secondary | ICD-10-CM

## 2022-12-20 DIAGNOSIS — M85852 Other specified disorders of bone density and structure, left thigh: Secondary | ICD-10-CM | POA: Diagnosis not present

## 2022-12-20 DIAGNOSIS — Z23 Encounter for immunization: Secondary | ICD-10-CM

## 2022-12-20 DIAGNOSIS — E119 Type 2 diabetes mellitus without complications: Secondary | ICD-10-CM

## 2022-12-20 DIAGNOSIS — Z0000 Encounter for general adult medical examination without abnormal findings: Secondary | ICD-10-CM | POA: Diagnosis not present

## 2022-12-20 DIAGNOSIS — E7800 Pure hypercholesterolemia, unspecified: Secondary | ICD-10-CM | POA: Diagnosis not present

## 2022-12-20 DIAGNOSIS — F419 Anxiety disorder, unspecified: Secondary | ICD-10-CM

## 2022-12-20 LAB — COMPREHENSIVE METABOLIC PANEL
ALT: 39 U/L — ABNORMAL HIGH (ref 0–35)
AST: 28 U/L (ref 0–37)
Albumin: 4.2 g/dL (ref 3.5–5.2)
Alkaline Phosphatase: 60 U/L (ref 39–117)
BUN: 20 mg/dL (ref 6–23)
CO2: 31 mEq/L (ref 19–32)
Calcium: 9.7 mg/dL (ref 8.4–10.5)
Chloride: 105 mEq/L (ref 96–112)
Creatinine, Ser: 0.6 mg/dL (ref 0.40–1.20)
GFR: 95.76 mL/min (ref >60.00)
Glucose, Bld: 138 mg/dL — ABNORMAL HIGH (ref 70–99)
Potassium: 4 mEq/L (ref 3.5–5.1)
Sodium: 143 mEq/L (ref 135–145)
Total Bilirubin: 0.6 mg/dL (ref 0.2–1.2)
Total Protein: 6.7 g/dL (ref 6.0–8.3)

## 2022-12-20 LAB — VITAMIN D 25 HYDROXY (VIT D DEFICIENCY, FRACTURES): VITD: 47.83 ng/mL (ref 30.00–100.00)

## 2022-12-20 LAB — CBC WITH DIFFERENTIAL/PLATELET
Basophils Absolute: 0.1 10*3/uL (ref 0.0–0.1)
Basophils Relative: 0.6 % (ref 0.0–3.0)
Eosinophils Absolute: 0.1 10*3/uL (ref 0.0–0.7)
Eosinophils Relative: 1.7 % (ref 0.0–5.0)
HCT: 43.9 % (ref 36.0–46.0)
Hemoglobin: 14.2 g/dL (ref 12.0–15.0)
Lymphocytes Relative: 46.1 % — ABNORMAL HIGH (ref 12.0–46.0)
Lymphs Abs: 3.9 10*3/uL (ref 0.7–4.0)
MCHC: 32.4 g/dL (ref 30.0–36.0)
MCV: 96.7 fl (ref 78.0–100.0)
Monocytes Absolute: 0.7 10*3/uL (ref 0.1–1.0)
Monocytes Relative: 7.7 % (ref 3.0–12.0)
Neutro Abs: 3.7 10*3/uL (ref 1.4–7.7)
Neutrophils Relative %: 43.9 % (ref 43.0–77.0)
Platelets: 268 10*3/uL (ref 150.0–400.0)
RBC: 4.53 Mil/uL (ref 3.87–5.11)
RDW: 13.2 % (ref 11.5–15.5)
WBC: 8.5 10*3/uL (ref 4.0–10.5)

## 2022-12-20 LAB — LIPID PANEL
Cholesterol: 133 mg/dL (ref 0–200)
HDL: 44.9 mg/dL (ref >39.00)
LDL Cholesterol: 54 mg/dL (ref 0–99)
NonHDL: 87.89
Total CHOL/HDL Ratio: 3
Triglycerides: 168 mg/dL — ABNORMAL HIGH (ref 0.0–149.0)
VLDL: 33.6 mg/dL (ref 0.0–40.0)

## 2022-12-20 LAB — MICROALBUMIN / CREATININE URINE RATIO
Creatinine,U: 68.1 mg/dL
Microalb Creat Ratio: 1.3 mg/g (ref 0.0–30.0)
Microalb, Ur: 0.9 mg/dL (ref 0.0–1.9)

## 2022-12-20 LAB — TSH: TSH: 2.56 u[IU]/mL (ref 0.35–5.50)

## 2022-12-20 LAB — HEMOGLOBIN A1C: Hgb A1c MFr Bld: 6.3 % (ref 4.6–6.5)

## 2022-12-20 NOTE — Assessment & Plan Note (Signed)
Chronic Nonobstructive CAD by coronary CTA-2013 No symptoms consistent with angina Check lipid panel, CMP, CBC, TSH Heart healthy diet Regular exercise

## 2022-12-20 NOTE — Addendum Note (Signed)
Addended by: Karma Ganja on: 12/20/2022 03:39 PM   Modules accepted: Orders

## 2022-12-20 NOTE — Assessment & Plan Note (Signed)
Chronic Increased anxiety Overall dealing with it Continue increased activity/exercise

## 2022-12-20 NOTE — Assessment & Plan Note (Signed)
 Chronic   Lab Results  Component Value Date   HGBA1C 5.8 (H) 03/18/2022   Sugars well controlled Check A1c, urine microalbumin today Continue lifestyle control Stressed regular exercise, diabetic diet

## 2022-12-20 NOTE — Assessment & Plan Note (Addendum)
Chronic DEXA up-to-date Stressed regular exercise Taking a flinstone MVI Check vitamin d level

## 2022-12-20 NOTE — Assessment & Plan Note (Signed)
Chronic Lab Results  Component Value Date   LDLCALC 76 12/03/2021   Last LDL close to goal Regular exercise and healthy diet encouraged Check lipid panel  Continue lifestyle control

## 2023-01-04 ENCOUNTER — Other Ambulatory Visit: Payer: Self-pay | Admitting: Family Medicine

## 2023-01-06 ENCOUNTER — Other Ambulatory Visit (HOSPITAL_COMMUNITY): Payer: Self-pay

## 2023-01-06 ENCOUNTER — Other Ambulatory Visit: Payer: Self-pay

## 2023-01-06 MED ORDER — GABAPENTIN 300 MG PO CAPS
300.0000 mg | ORAL_CAPSULE | Freq: Two times a day (BID) | ORAL | 0 refills | Status: DC
  Filled 2023-01-06: qty 180, 90d supply, fill #0

## 2023-01-13 ENCOUNTER — Encounter: Payer: Self-pay | Admitting: Family Medicine

## 2023-01-13 ENCOUNTER — Other Ambulatory Visit (HOSPITAL_COMMUNITY): Payer: Self-pay

## 2023-01-13 ENCOUNTER — Telehealth (INDEPENDENT_AMBULATORY_CARE_PROVIDER_SITE_OTHER): Payer: Commercial Managed Care - PPO | Admitting: Family Medicine

## 2023-01-13 VITALS — Ht 62.0 in | Wt 144.0 lb

## 2023-01-13 DIAGNOSIS — Z634 Disappearance and death of family member: Secondary | ICD-10-CM | POA: Diagnosis not present

## 2023-01-13 DIAGNOSIS — F411 Generalized anxiety disorder: Secondary | ICD-10-CM | POA: Diagnosis not present

## 2023-01-13 DIAGNOSIS — F430 Acute stress reaction: Secondary | ICD-10-CM

## 2023-01-13 DIAGNOSIS — F4321 Adjustment disorder with depressed mood: Secondary | ICD-10-CM | POA: Diagnosis not present

## 2023-01-13 MED ORDER — CLONAZEPAM 0.25 MG PO TBDP
0.2500 mg | ORAL_TABLET | Freq: Two times a day (BID) | ORAL | 0 refills | Status: DC | PRN
  Filled 2023-01-13: qty 60, 30d supply, fill #0

## 2023-01-13 MED ORDER — ESCITALOPRAM OXALATE 10 MG PO TABS
10.0000 mg | ORAL_TABLET | Freq: Every day | ORAL | 2 refills | Status: AC
Start: 2023-01-13 — End: ?
  Filled 2023-01-13: qty 30, 30d supply, fill #0

## 2023-01-13 MED ORDER — CLONAZEPAM 0.25 MG PO TBDP
0.2500 mg | ORAL_TABLET | Freq: Two times a day (BID) | ORAL | 0 refills | Status: DC | PRN
Start: 2023-01-13 — End: 2023-02-18
  Filled 2023-01-13: qty 60, 30d supply, fill #0

## 2023-01-13 NOTE — Progress Notes (Signed)
Virtual Visit via Video Note  I connected with Sandra [name] on 01/13/23 at  2:00 PM EDT by a video enabled telemedicine application and verified that I am speaking with the correct person using two identifiers.  Patient Location: Home Provider Location: Home Office  I discussed the limitations, risks, security, and privacy concerns of performing an evaluation and management service by video and the availability of in person appointments. I also discussed with the patient that there may be a patient responsible charge related to this service. The patient expressed understanding and agreed to proceed.  Subjective: PCP: Pincus Sanes, MD  Chief Complaint  Patient presents with   Anxiety    "Can't breathe" "can't [employer]' Son passed away this weekend - very tragically, she had to identify the body    Patient is accompanied by her daughter, who she is okay with being present, and to whom she has given permission to help answer questions.  Patient is having a really hard time with her anxiety.  States she cannot breathe, cannot cope, and cannot focus.  Her son passed away two days ago and she had to identify his body at the scene.  She is currently waiting for an autopsy to be performed, which is delaying funeral arrangements.     01/13/2023    1:53 PM  GAD 7 : Generalized Anxiety Score  Nervous, Anxious, on Edge 3  Control/stop worrying 3  Worry too much - different things 2  Trouble relaxing 3  Restless 3  Easily annoyed or irritable 1  Afraid - awful might happen 0  Total GAD 7 Score 15  Anxiety Difficulty Very difficult     ROS: Per HPI  Current Outpatient Medications:    acetaminophen (TYLENOL) 500 MG tablet, Take 500 mg by mouth every 6 (six) hours as needed for moderate pain., Disp: , Rfl:    COLLAGEN PO, Take 1 Scoop by mouth daily., Disp: , Rfl:    gabapentin (NEURONTIN) 300 MG capsule, Take 1 capsule (300 mg total) by mouth 2 (two) times daily., Disp: 180 capsule,  Rfl: 0   glucose blood (FREESTYLE LITE) test strip, Use daily as instructed., Disp: 100 each, Rfl: 12   Lancets (FREESTYLE) lancets, Use 1 lancet up to 4 times daily to check blood glucose as directed., Disp: 100 each, Rfl: 0   MELATONIN PO, Take 1 tablet by mouth at bedtime as needed (sleep)., Disp: , Rfl:    Multiple Vitamins-Minerals (MULTIVITAMIN PO), Take 2 tablets by mouth daily. , Disp: , Rfl:    omeprazole (PRILOSEC) 40 MG capsule, Take 1 capsule by mouth 2 times daily before a meal. Open capsule and sprinkle in water, other liquid or applesauce (Patient taking differently: Take 40 mg by mouth at bedtime.), Disp: 180 capsule, Rfl: 1   polyethylene glycol powder (MIRALAX) 17 GM/SCOOP powder, Take 17 g by mouth daily. (Patient taking differently: Take 17 g by mouth daily as needed for moderate constipation.), Disp: 238 g, Rfl: 0   ondansetron (ZOFRAN-ODT) 4 MG disintegrating tablet, DISSOLVE 1 TABLET BY MOUTH EVERY 8 HOURS AS NEEDED FOR NAUSEA OR VOMITING (MAY TAKE 2 TABLETS IF 1 TABLET IS INEFFECTIVE) (Patient not taking: Reported on 01/13/2023), Disp: 30 tablet, Rfl: 3  Observations/Objective: Today's Vitals   01/13/23 1346  Weight: 144 lb (65.3 kg)  Height: 5\' 2"  (1.575 m)   Physical Exam Constitutional:      General: She is not in acute distress.    Appearance: Normal appearance. She  is not ill-appearing or toxic-appearing.  Eyes:     General: No scleral icterus.       Right eye: No discharge.        Left eye: No discharge.     Conjunctiva/sclera: Conjunctivae normal.  Pulmonary:     Effort: Pulmonary effort is normal. No respiratory distress.  Neurological:     Mental Status: She is alert and oriented to person, place, and time.  Psychiatric:        Attention and Perception: Attention and perception normal.        Mood and Affect: Mood is depressed. Affect is tearful.        Speech: Speech normal.        Behavior: Behavior normal.        Thought Content: Thought content  normal.        Cognition and Memory: Cognition and memory normal.        Judgment: Judgment normal.    Assessment and Plan: 1-2. Anxiety in acute stress reaction/Grief at loss of child Education provided on managing loss.  Encouraged therapy.  Patient states she will connect with her pastor at church.  Started on Lexapro 10 mg daily for long-term/maintenance therapy.  Started on clonazepam 0.25 mg twice daily as needed for acute relief.  We discussed this is not a long-term medication and will be discontinued as the long-term medication has time to get into her system.  Patient will need FMLA paperwork completed.  I have provided her a note to be out for 3 weeks, returning to work on Monday, October 7. - clonazePAM (KLONOPIN) 0.25 MG disintegrating tablet; Take 1 tablet (0.25 mg total) by mouth 2 (two) times daily as needed.  Dispense: 60 tablet; Refill: 0 - escitalopram (LEXAPRO) 10 MG tablet; Take 1 tablet (10 mg total) by mouth daily.  Dispense: 30 tablet; Refill: 2   Follow Up Instructions: Return in about 3 weeks (around 02/03/2023) for anxiety with PCP.   I discussed the assessment and treatment plan with the patient. The patient was provided an opportunity to ask questions, and all were answered. The patient agreed with the plan and demonstrated an understanding of the instructions.   The patient was advised to call back or seek an in-person evaluation if the symptoms worsen or if the condition fails to improve as anticipated.  The above assessment and management plan was discussed with the patient. The patient verbalized understanding of and has agreed to the management plan.   Gwenlyn Fudge, FNP

## 2023-01-14 ENCOUNTER — Other Ambulatory Visit (HOSPITAL_COMMUNITY): Payer: Self-pay

## 2023-01-21 ENCOUNTER — Telehealth: Payer: Self-pay | Admitting: Internal Medicine

## 2023-01-21 NOTE — Telephone Encounter (Signed)
We have received FMLA forms and they have been placed in the providers box.   Please fax to: [phone]

## 2023-01-21 NOTE — Telephone Encounter (Signed)
Form received

## 2023-01-24 ENCOUNTER — Telehealth: Payer: Self-pay | Admitting: Internal Medicine

## 2023-01-24 ENCOUNTER — Encounter: Payer: Self-pay | Admitting: Family Medicine

## 2023-01-24 DIAGNOSIS — Z0279 Encounter for issue of other medical certificate: Secondary | ICD-10-CM

## 2023-01-24 NOTE — Telephone Encounter (Signed)
Duplicate

## 2023-01-24 NOTE — Telephone Encounter (Signed)
We have received FMLA pw and it has been placed in the proivders box.  Please fax to:  [phone]

## 2023-01-24 NOTE — Telephone Encounter (Signed)
We have received disability forms and they have been placed in the providers box.   Please fax to: [phone]

## 2023-01-27 ENCOUNTER — Ambulatory Visit: Payer: Commercial Managed Care - PPO | Admitting: Internal Medicine

## 2023-01-28 NOTE — Telephone Encounter (Signed)
Paperwork completed and faxed today.  Patient notified via my-chart.

## 2023-01-28 NOTE — Telephone Encounter (Signed)
[facility] called back and states that they needs patient's last office visit notes (01/13/2023-present) be faxed to them at:  [phone] - call back number:  [phone] ext [phone] -

## 2023-01-29 NOTE — Progress Notes (Unsigned)
Virtual Visit via Video Note  I connected with Sandra [name] on 01/29/23 at  9:10 AM EDT by a video enabled telemedicine application and verified that I am speaking with the correct person using two identifiers.   I discussed the limitations of evaluation and management by telemedicine and the availability of in person appointments. The patient expressed understanding and agreed to proceed.  Present for the visit:  Myself, Dr Cheryll Cockayne, Vashti Hey.  The patient is currently at home and I am in the office.    No referring provider.    History of Present Illness: Here for follow up visit for anxiety.  Had appt 01-26-2023 with Alona Bene - son had passed away that weekend tragically and she had to ID the body.  She was distraught.    Started on clonazepam 0.25 mg bid prn and lexapro 10 mg daily.  FMLA paperwork completed.   FMLA Jan 26, 2023 - 02/27/23.     Nervous all the time.  Does not want to leave her house - did once and had a panic attack.  When it gets dark she gets very nervous and does not want to go to sleep - when she wakes up she has to relive it - the fact that her son has died.    Sleeping 4-5 hours with the clonazepam. She wakes up 1-2 times nightly.     Review of Systems  Constitutional:        Decreased appetite  Respiratory:  Positive for shortness of breath (at times with anxiety).   Cardiovascular:  Positive for chest pain (chest tightness at times).  Gastrointestinal:  Positive for nausea.  Psychiatric/Behavioral:  Positive for depression. The patient is nervous/anxious and has insomnia.       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[employer]   Social Determinants of Health   Financial Resource Strain: Medium Risk (12/20/2022)   Overall Financial Resource Strain (CARDIA)    Difficulty of Paying Living Expenses: Somewhat hard  Food Insecurity: Food Insecurity Present (12/20/2022)   Hunger Vital Sign    Worried About Running Out of Food in the Last Year: Sometimes true    Ran Out of Food in the Last Year: Sometimes true  Transportation Needs: No Transportation Needs (12/20/2022)   PRAPARE - Administrator, Civil Service (Medical): No    Lack of Transportation (Non-Medical): No  Physical Activity: Sufficiently Active (12/20/2022)   Exercise Vital Sign    Days of Exercise per Week: 5 days    Minutes of Exercise per Session: 30 min  Stress: Stress  Concern Present (12/20/2022)   [employer] of Occupational Health - Occupational Stress Questionnaire    Feeling of Stress : Very much  Social Connections: Moderately Integrated (12/20/2022)   Social Connection and Isolation Panel [NHANES]    Frequency of Communication with Friends and Family: More than three times a week    Frequency of Social Gatherings with Friends and Family: More than three times a week    Attends Religious Services: More than 4 times per year    Active Member of [employer] or Organizations: Yes    Attends Engineer, structural: More than 4 times per year    Marital Status: Divorced     Observations/Objective: Appears well in NAD Breathing  normally Modd is anxious, depressed  Assessment and Plan:  See Problem List for Assessment and Plan of chronic medical problems.   Follow Up Instructions:    I discussed the assessment and treatment plan with the patient. The patient was provided an opportunity to ask questions and all were answered. The patient agreed with the plan and demonstrated an understanding of the instructions.   The patient was advised to call back or seek an in-person evaluation if the symptoms worsen or if the condition fails to improve as anticipated.    Pincus Sanes, MD

## 2023-01-30 ENCOUNTER — Telehealth (INDEPENDENT_AMBULATORY_CARE_PROVIDER_SITE_OTHER): Payer: Commercial Managed Care - PPO | Admitting: Internal Medicine

## 2023-01-30 ENCOUNTER — Encounter: Payer: Self-pay | Admitting: Internal Medicine

## 2023-01-30 ENCOUNTER — Other Ambulatory Visit: Payer: Self-pay

## 2023-01-30 DIAGNOSIS — Z634 Disappearance and death of family member: Secondary | ICD-10-CM | POA: Diagnosis not present

## 2023-01-30 DIAGNOSIS — F4329 Adjustment disorder with other symptoms: Secondary | ICD-10-CM

## 2023-01-30 DIAGNOSIS — F4321 Adjustment disorder with depressed mood: Secondary | ICD-10-CM | POA: Insufficient documentation

## 2023-01-30 MED ORDER — ESCITALOPRAM OXALATE 20 MG PO TABS
20.0000 mg | ORAL_TABLET | Freq: Every day | ORAL | 5 refills | Status: DC
  Filled 2023-01-30: qty 30, 30d supply, fill #0
  Filled 2023-03-05: qty 30, 30d supply, fill #1
  Filled 2023-04-07: qty 30, 30d supply, fill #2
  Filled 2023-05-04: qty 30, 30d supply, fill #3
  Filled 2023-06-03: qty 30, 30d supply, fill #4
  Filled 2023-07-02: qty 30, 30d supply, fill #5

## 2023-01-30 NOTE — Assessment & Plan Note (Signed)
Acute Son died and she had to identify his body 9/14 She is of course very upset, depressed, grieving and anxious She is taking clonazepam 0.25 mg HS and lexapro 10 mg daily and it is helping She does experience SOB, chest tightness at times, sleep difficulties, decreased appetite Talking with her preacher and her cousin who also lost a child traumatically Will increase lexapro to 20 mg daily Can take clonazepam 0.5 mg HS if needed FMLA until end of October - will determine at end of October if she can return to work in November

## 2023-02-13 ENCOUNTER — Ambulatory Visit: Payer: Commercial Managed Care - PPO | Admitting: Family Medicine

## 2023-02-13 ENCOUNTER — Encounter: Payer: Self-pay | Admitting: Internal Medicine

## 2023-02-18 ENCOUNTER — Other Ambulatory Visit: Payer: Self-pay | Admitting: Family Medicine

## 2023-02-18 DIAGNOSIS — Z634 Disappearance and death of family member: Secondary | ICD-10-CM

## 2023-02-18 DIAGNOSIS — F430 Acute stress reaction: Secondary | ICD-10-CM

## 2023-02-19 ENCOUNTER — Other Ambulatory Visit: Payer: Self-pay

## 2023-02-19 MED ORDER — CLONAZEPAM 0.25 MG PO TBDP
0.2500 mg | ORAL_TABLET | Freq: Two times a day (BID) | ORAL | 0 refills | Status: DC | PRN
  Filled 2023-02-19: qty 60, 30d supply, fill #0

## 2023-02-24 DIAGNOSIS — S0511XD Contusion of eyeball and orbital tissues, right eye, subsequent encounter: Secondary | ICD-10-CM | POA: Diagnosis not present

## 2023-02-24 DIAGNOSIS — H40013 Open angle with borderline findings, low risk, bilateral: Secondary | ICD-10-CM | POA: Diagnosis not present

## 2023-02-24 DIAGNOSIS — Z961 Presence of intraocular lens: Secondary | ICD-10-CM | POA: Diagnosis not present

## 2023-02-24 DIAGNOSIS — H35371 Puckering of macula, right eye: Secondary | ICD-10-CM | POA: Diagnosis not present

## 2023-02-24 LAB — HM DIABETES EYE EXAM

## 2023-02-25 NOTE — Telephone Encounter (Signed)
Additional paperwork received via fax and placed in provider's box up front.

## 2023-02-27 ENCOUNTER — Encounter: Payer: Self-pay | Admitting: Internal Medicine

## 2023-03-06 ENCOUNTER — Other Ambulatory Visit (HOSPITAL_COMMUNITY): Payer: Self-pay

## 2023-03-06 ENCOUNTER — Other Ambulatory Visit: Payer: Self-pay

## 2023-03-06 ENCOUNTER — Encounter: Payer: Self-pay | Admitting: Pharmacist

## 2023-03-06 DIAGNOSIS — F4322 Adjustment disorder with anxiety: Secondary | ICD-10-CM | POA: Diagnosis not present

## 2023-03-10 ENCOUNTER — Other Ambulatory Visit (HOSPITAL_COMMUNITY): Payer: Self-pay

## 2023-03-10 ENCOUNTER — Ambulatory Visit: Payer: Commercial Managed Care - PPO | Admitting: Family Medicine

## 2023-03-13 DIAGNOSIS — F4322 Adjustment disorder with anxiety: Secondary | ICD-10-CM | POA: Diagnosis not present

## 2023-03-24 ENCOUNTER — Encounter: Payer: Self-pay | Admitting: Internal Medicine

## 2023-03-24 NOTE — Progress Notes (Unsigned)
Virtual Visit via Video Note  I connected with Shona Simpson on 03/24/23 at  7:50 AM EST by a video enabled telemedicine application and verified that I am speaking with the correct person using two identifiers.   I discussed the limitations of evaluation and management by telemedicine and the availability of in person appointments. The patient expressed understanding and agreed to proceed.  Present for the visit:  Myself, Dr Cheryll Cockayne, Vashti Hey.  The patient is currently at home and I am in the office.    No referring provider.    History of Present Illness: This visit is for follow up of her depression.       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[employer]   Social Determinants of Health   Financial Resource Strain: Medium Risk (12/20/2022)   Overall Financial Resource Strain (CARDIA)    Difficulty of Paying Living Expenses: Somewhat hard  Food Insecurity: Food Insecurity Present (12/20/2022)   Hunger Vital Sign    Worried About Running Out of Food in the Last Year: Sometimes true    Ran Out of Food in the Last Year: Sometimes true   Transportation Needs: No Transportation Needs (12/20/2022)   PRAPARE - Administrator, Civil Service (Medical): No    Lack of Transportation (Non-Medical): No  Physical Activity: Sufficiently Active (12/20/2022)   Exercise Vital Sign    Days of Exercise per Week: 5 days    Minutes of Exercise per Session: 30 min  Stress: Stress Concern Present (12/20/2022)   [employer] of Occupational Health - Occupational Stress Questionnaire    Feeling of Stress : Very much  Social Connections: Moderately Integrated (12/20/2022)   Social Connection and Isolation Panel [NHANES]    Frequency of Communication with Friends and Family: More than three times a week    Frequency of Social Gatherings with Friends and Family: More than three times a week    Attends Religious Services: More than 4 times per year    Active Member of [employer] or Organizations: Yes    Attends Engineer, structural: More than 4 times per year    Marital Status: Divorced     Observations/Objective: Appears well in NAD   Assessment and Plan:  See Problem List for Assessment and Plan of chronic medical problems.   Follow Up Instructions:    I discussed the assessment and treatment plan with the patient. The patient was provided an opportunity to ask questions and all were answered. The patient agreed with the plan and demonstrated an understanding of the instructions.  The patient was advised to call back or seek an in-person evaluation if the symptoms worsen or if the condition fails to improve as anticipated.    Pincus Sanes, MD

## 2023-03-25 ENCOUNTER — Other Ambulatory Visit (HOSPITAL_COMMUNITY): Payer: Self-pay

## 2023-03-25 ENCOUNTER — Other Ambulatory Visit: Payer: Self-pay

## 2023-03-25 ENCOUNTER — Telehealth (INDEPENDENT_AMBULATORY_CARE_PROVIDER_SITE_OTHER): Payer: Commercial Managed Care - PPO | Admitting: Internal Medicine

## 2023-03-25 DIAGNOSIS — F430 Acute stress reaction: Secondary | ICD-10-CM

## 2023-03-25 DIAGNOSIS — Z634 Disappearance and death of family member: Secondary | ICD-10-CM | POA: Diagnosis not present

## 2023-03-25 DIAGNOSIS — F411 Generalized anxiety disorder: Secondary | ICD-10-CM | POA: Diagnosis not present

## 2023-03-25 DIAGNOSIS — F4321 Adjustment disorder with depressed mood: Secondary | ICD-10-CM | POA: Diagnosis not present

## 2023-03-25 DIAGNOSIS — F419 Anxiety disorder, unspecified: Secondary | ICD-10-CM | POA: Diagnosis not present

## 2023-03-25 MED ORDER — CLONAZEPAM 0.25 MG PO TBDP
0.2500 mg | ORAL_TABLET | Freq: Two times a day (BID) | ORAL | 0 refills | Status: DC | PRN
  Filled 2023-03-25: qty 60, 30d supply, fill #0

## 2023-03-25 NOTE — Assessment & Plan Note (Signed)
Chronic Increased anxiety related to the loss of her son Still having panic attacks especially when she goes to the house Continue Lexapro 20 mg daily, clonazepam 0.25 mg twice daily She is seeing a therapist regularly

## 2023-03-25 NOTE — Assessment & Plan Note (Signed)
Still struggling to understand and cope with the loss of her son Has significant depression, anxiety and grief She is seeing a therapist regularly which is helping She is taking Lexapro 20 mg daily and clonazepam 0.2 mg twice daily as needed-both of which are helping It sounds like she is making slow progress, but still struggling with anxiety, panic attacks, grief and depression Sleep is still not great, appetite varies and is typically eating 1 meal a day, has difficulty leaving the house, difficulty with concentration At this point she is not able to return to work Will extend [employer] for 1 month at which time I expect her to be able to return to work Continue current medications and therapy

## 2023-03-28 DIAGNOSIS — F4322 Adjustment disorder with anxiety: Secondary | ICD-10-CM | POA: Diagnosis not present

## 2023-04-03 DIAGNOSIS — Z0279 Encounter for issue of other medical certificate: Secondary | ICD-10-CM

## 2023-04-07 ENCOUNTER — Telehealth: Payer: Self-pay | Admitting: Internal Medicine

## 2023-04-07 DIAGNOSIS — F4322 Adjustment disorder with anxiety: Secondary | ICD-10-CM | POA: Diagnosis not present

## 2023-04-07 NOTE — Telephone Encounter (Signed)
Patient dropped off document Accommodation Form, to be filled out by provider. Patient requested to send it back via Fax within 7-days. Document is located in providers tray at front office.Please advise at [facility] [phone]

## 2023-04-08 ENCOUNTER — Other Ambulatory Visit: Payer: Self-pay

## 2023-04-08 NOTE — Telephone Encounter (Signed)
Form received today.

## 2023-04-10 DIAGNOSIS — Z0279 Encounter for issue of other medical certificate: Secondary | ICD-10-CM

## 2023-04-11 NOTE — Telephone Encounter (Signed)
Form completed and awaiting Dr. Lawerance Bach signature ?

## 2023-04-14 ENCOUNTER — Telehealth: Payer: Self-pay | Admitting: Internal Medicine

## 2023-04-14 NOTE — Telephone Encounter (Signed)
Vernia Buff is calling stating that we will be resending a fax for this pt it needs to be filled out by Dr. Lawerance Bach faxed back if possible its is regarding pt starting-- disability.   Please advise, Thanks  CB: [phone] ext [phone]  Fax: [phone]

## 2023-04-14 NOTE — Telephone Encounter (Signed)
Forms completed today and faxed conformation received.

## 2023-04-15 ENCOUNTER — Encounter: Payer: Self-pay | Admitting: Internal Medicine

## 2023-04-15 NOTE — Telephone Encounter (Signed)
 Care team updated and letter sent for eye exam notes.

## 2023-04-16 DIAGNOSIS — F4322 Adjustment disorder with anxiety: Secondary | ICD-10-CM | POA: Diagnosis not present

## 2023-04-16 NOTE — Telephone Encounter (Signed)
Office notes sent today.

## 2023-04-17 ENCOUNTER — Ambulatory Visit: Payer: Commercial Managed Care - PPO | Admitting: Family Medicine

## 2023-04-29 ENCOUNTER — Other Ambulatory Visit: Payer: Self-pay | Admitting: Internal Medicine

## 2023-04-29 DIAGNOSIS — F4321 Adjustment disorder with depressed mood: Secondary | ICD-10-CM

## 2023-04-29 DIAGNOSIS — F411 Generalized anxiety disorder: Secondary | ICD-10-CM

## 2023-05-01 ENCOUNTER — Other Ambulatory Visit: Payer: Self-pay

## 2023-05-01 ENCOUNTER — Other Ambulatory Visit (HOSPITAL_COMMUNITY): Payer: Self-pay

## 2023-05-01 ENCOUNTER — Other Ambulatory Visit: Payer: Self-pay | Admitting: Internal Medicine

## 2023-05-01 MED ORDER — CLONAZEPAM 0.25 MG PO TBDP
0.2500 mg | ORAL_TABLET | Freq: Two times a day (BID) | ORAL | 0 refills | Status: DC | PRN
Start: 2023-05-01 — End: 2023-05-06
  Filled 2023-05-01: qty 60, 30d supply, fill #0

## 2023-05-02 ENCOUNTER — Other Ambulatory Visit (HOSPITAL_BASED_OUTPATIENT_CLINIC_OR_DEPARTMENT_OTHER): Payer: Self-pay

## 2023-05-02 ENCOUNTER — Other Ambulatory Visit (HOSPITAL_COMMUNITY): Payer: Self-pay

## 2023-05-02 MED ORDER — OMEPRAZOLE 40 MG PO CPDR
40.0000 mg | DELAYED_RELEASE_CAPSULE | Freq: Two times a day (BID) | ORAL | 1 refills | Status: DC
  Filled 2023-05-02: qty 180, 90d supply, fill #0
  Filled 2023-07-27: qty 180, 90d supply, fill #1

## 2023-05-02 NOTE — Telephone Encounter (Signed)
 I spoke with her and the omeprazole is working well for her. She does one daily and sometimes BID depending on her diet.

## 2023-05-04 ENCOUNTER — Other Ambulatory Visit (HOSPITAL_COMMUNITY): Payer: Self-pay

## 2023-05-05 ENCOUNTER — Encounter: Payer: Self-pay | Admitting: Internal Medicine

## 2023-05-05 NOTE — Progress Notes (Signed)
 Virtual Visit via Video Note  I connected with Sandra Charlies [name] on 05/05/23 at  3:40 PM EST by a video enabled telemedicine application and verified that I am speaking with the correct person using two identifiers.   I discussed the limitations of evaluation and management by telemedicine and the availability of in person appointments. The patient expressed understanding and agreed to proceed.  Present for the visit:  Myself, Dr Glade Hope, Sandra [name].  The patient is currently at home and I am in the office.    No referring provider.    History of Present Illness: Todays visit is for follow up of her grief of losing her son, anxiety and depression.   She continues to see her therapist regularly. She is taking the medication - clonazepam  and lexapro  as prescribed.  She felt like she was doing well with the medication and was doing a little bit better.  She has gone to church a couple of times and has been out of the house a few times.  She got autopsy report recently- he swallowed whatever he had at the traffic stop and his body could not handle it.  The cause of death was met amphetamine overdose.  This has set her back.  He was a functioning addict and she had no idea how bad he was which is very upsetting.   She was supposed to return to work yesterday, but is having difficulty functioning at home and knows she cannot return to work.  She feels like she is developing dementia.  She is going to the bathroom twice and could not remember how she got there or going there.  She has continued to overflow the kitchen sink twice-forgets the water  zone.  She orders her groceries, but has done that prior to this occurring.  She is only left the house a few times.  She feels her depression is better with the Lexapro , but her anxiety is really not controlled.    Taking clonazepam  0.5 mg at night - which helps her sleep which helps.    Was supposed to return to work 05/05/23 and can not go back  to work.  Will extend to being out through 05/30/23.  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[employer]   Social Drivers of Health   Financial Resource Strain: Medium Risk (12/20/2022)   Overall Financial Resource Strain (CARDIA)    Difficulty of Paying Living Expenses: Somewhat hard  Food Insecurity: Food Insecurity Present (12/20/2022)   Hunger Vital Sign    Worried About Running  Out of Food in the Last Year: Sometimes true    Ran Out of Food in the Last Year: Sometimes true  Transportation Needs: No Transportation Needs (12/20/2022)   PRAPARE - Administrator, Civil Service (Medical): No    Lack of Transportation (Non-Medical): No  Physical Activity: Sufficiently Active (12/20/2022)   Exercise Vital Sign    Days of Exercise per Week: 5 days    Minutes of Exercise per Session: 30 min  Stress: Stress Concern Present (12/20/2022)   [employer] of Occupational Health - Occupational Stress Questionnaire    Feeling of Stress : Very much  Social Connections: Moderately Integrated (12/20/2022)   Social Connection and Isolation Panel  [NHANES]    Frequency of Communication with Friends and Family: More than three times a week    Frequency of Social Gatherings with Friends and Family: More than three times a week    Attends Religious Services: More than 4 times per year    Active Member of [employer] or Organizations: Yes    Attends Engineer, Structural: More than 4 times per year    Marital Status: Divorced     Observations/Objective: Appears well in NAD, but appears anxious and depressed Crying intermittent throughout visit  Assessment and Plan:  See Problem List for Assessment and Plan of chronic medical problems.   Follow Up Instructions:    I discussed the assessment and treatment plan with the patient. The patient was provided an opportunity to ask questions and all were answered. The patient agreed with the plan and demonstrated an understanding of the instructions.   The patient was advised to call back or seek an in-person evaluation if the symptoms worsen or if the condition fails to improve as anticipated.    Glade JINNY Hope, MD

## 2023-05-06 ENCOUNTER — Other Ambulatory Visit (HOSPITAL_COMMUNITY): Payer: Self-pay

## 2023-05-06 ENCOUNTER — Telehealth: Payer: Commercial Managed Care - PPO | Admitting: Internal Medicine

## 2023-05-06 DIAGNOSIS — Z634 Disappearance and death of family member: Secondary | ICD-10-CM | POA: Diagnosis not present

## 2023-05-06 DIAGNOSIS — F419 Anxiety disorder, unspecified: Secondary | ICD-10-CM | POA: Diagnosis not present

## 2023-05-06 DIAGNOSIS — F4321 Adjustment disorder with depressed mood: Secondary | ICD-10-CM | POA: Diagnosis not present

## 2023-05-06 MED ORDER — CLONAZEPAM 0.5 MG PO TABS
0.5000 mg | ORAL_TABLET | Freq: Two times a day (BID) | ORAL | 3 refills | Status: DC | PRN
  Filled 2023-05-06: qty 60, 30d supply, fill #0

## 2023-05-06 NOTE — Assessment & Plan Note (Signed)
 Subacute She still struggling with anxiety, depression and grief Depression is slightly better with the Lexapro , but still very anxious Recently got the autopsy report and that has set her back and increased both her anxiety and depression Has been able to leave the house a few times, but still having difficulty with sleeping, appetite, memory and functioning Not able to return to work at this time Will extend FMLA through 05/30/2023 Tentative return to work date 06/02/2023-I do think when she is able to process the autopsy report and work through this with her therapist she will be able to return to work which may be sooner than the tentative date She will continue to see her therapist

## 2023-05-06 NOTE — Assessment & Plan Note (Signed)
 Chronic Experiencing increased anxiety related to the death of her son Has had panic attacks, difficulty leaving the house, memory issues because of anxiety, difficulty sleeping, difficulty eating There has been some improvement, but receiving the recent autopsy report has set her back Anxiety is not controlled Increase clonazepam  to 0.5 mg twice daily Continue Lexapro  20 mg daily She is continuing to see her therapist which is very helpful Not able to return to work-extend out of work to date through 05/30/2023 Tentative return to work date 06/02/2023-sooner if able

## 2023-05-07 DIAGNOSIS — F4322 Adjustment disorder with anxiety: Secondary | ICD-10-CM | POA: Diagnosis not present

## 2023-05-08 ENCOUNTER — Other Ambulatory Visit (HOSPITAL_COMMUNITY): Payer: Self-pay

## 2023-05-15 ENCOUNTER — Encounter: Payer: Self-pay | Admitting: Family Medicine

## 2023-05-15 DIAGNOSIS — F4322 Adjustment disorder with anxiety: Secondary | ICD-10-CM | POA: Diagnosis not present

## 2023-05-21 ENCOUNTER — Ambulatory Visit: Payer: Commercial Managed Care - PPO | Admitting: Family Medicine

## 2023-05-25 ENCOUNTER — Encounter: Payer: Self-pay | Admitting: Internal Medicine

## 2023-05-25 NOTE — Progress Notes (Unsigned)
Virtual Visit via Video Note  I connected with Sandra [name] on 05/25/23 at  9:10 AM EST by a video enabled telemedicine application and verified that I am speaking with the correct person using two identifiers.   I discussed the limitations of evaluation and management by telemedicine and the availability of in person appointments. The patient expressed understanding and agreed to proceed.  Present for the visit:  Myself, Dr Cheryll Cockayne, Vashti Hey.  The patient is currently at home and I am in the office.    No referring provider.    History of Present Illness: Here for follow up of depression, anxiety, grief.   She feels edgy, jittery, nervous.  She still has panic attacks-they tend to occur when she is outside of her house.  Some days she rather just stay inside of her house and avoid people.  She is going to try to go back to work starting next Tuesday, but would like to try half days only.  She knows this can be difficult, but feels like she should try to get back into a routine and get out of the house more.  She still seeing her therapist once a week.  She is taking her medications as prescribed-needs a little bit more clonazepam so we will up the dose slightly.  When she does have a panic attack she will experience chest tightness or pressure, shortness of breath, palpitations, headaches and dizziness.  Since deciding to go back to work she has not been sleeping as good.     Review of Systems  Respiratory:  Positive for shortness of breath (with anxiety).   Cardiovascular:  Positive for chest pain (feeling of something sitting on chest with anxiety) and palpitations (with panic attacks).  Neurological:  Positive for dizziness (with anxiety) and headaches (with panic attacks).  Psychiatric/Behavioral:  Positive for depression. The patient is nervous/anxious and has insomnia (related to knowing she is going back to work).      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[employer]   Social Drivers of Health   Financial Resource Strain: Medium Risk (12/20/2022)   Overall Financial Resource Strain (CARDIA)    Difficulty of Paying Living Expenses: Somewhat hard  Food Insecurity: Food Insecurity Present (12/20/2022)   Hunger Vital Sign    Worried About Running Out of Food in the Last Year: Sometimes true    Ran Out of Food in the Last Year: Sometimes true  Transportation Needs: No Transportation Needs (12/20/2022)   PRAPARE - Administrator, Civil Service (Medical): No  Lack of Transportation (Non-Medical): No  Physical Activity: Sufficiently Active (12/20/2022)   Exercise Vital Sign    Days of Exercise per Week: 5 days    Minutes of Exercise per Session: 30 min  Stress: Stress Concern Present (12/20/2022)   [employer] of Occupational Health - Occupational Stress Questionnaire    Feeling of Stress : Very much  Social Connections: Moderately Integrated (12/20/2022)   Social Connection and Isolation Panel [NHANES]    Frequency of Communication with Friends and Family: More than three times a week    Frequency of Social  Gatherings with Friends and Family: More than three times a week    Attends Religious Services: More than 4 times per year    Active Member of [employer] or Organizations: Yes    Attends Engineer, structural: More than 4 times per year    Marital Status: Divorced     Observations/Objective: Appears well in NAD Breathing normally Appears a little anxious  Assessment and Plan:  See Problem List for Assessment and Plan of chronic medical problems.   Follow Up Instructions:    I discussed the assessment and treatment plan with the patient. The patient was provided an opportunity to ask questions and all were answered. The patient agreed with the plan and demonstrated an understanding of the instructions.   The patient was advised to call back or seek an in-person evaluation if the symptoms worsen or if the condition fails to improve as anticipated.    Pincus Sanes, MD

## 2023-05-26 ENCOUNTER — Other Ambulatory Visit: Payer: Self-pay

## 2023-05-26 ENCOUNTER — Telehealth (INDEPENDENT_AMBULATORY_CARE_PROVIDER_SITE_OTHER): Payer: Commercial Managed Care - PPO | Admitting: Internal Medicine

## 2023-05-26 ENCOUNTER — Other Ambulatory Visit (HOSPITAL_COMMUNITY): Payer: Self-pay

## 2023-05-26 DIAGNOSIS — Z634 Disappearance and death of family member: Secondary | ICD-10-CM

## 2023-05-26 DIAGNOSIS — F4321 Adjustment disorder with depressed mood: Secondary | ICD-10-CM | POA: Diagnosis not present

## 2023-05-26 DIAGNOSIS — F419 Anxiety disorder, unspecified: Secondary | ICD-10-CM

## 2023-05-26 MED ORDER — CLONAZEPAM 0.5 MG PO TABS
ORAL_TABLET | ORAL | 3 refills | Status: DC
  Filled 2023-05-26: qty 90, fill #0
  Filled 2023-06-03: qty 90, 30d supply, fill #0
  Filled 2023-07-13: qty 90, 30d supply, fill #1
  Filled 2023-08-26: qty 90, 30d supply, fill #2
  Filled 2023-10-01: qty 90, 30d supply, fill #3

## 2023-05-26 NOTE — Patient Instructions (Signed)
Marland Kitchen

## 2023-05-26 NOTE — Assessment & Plan Note (Signed)
Subacute She still struggling with anxiety, depression and grief Still struggling with depression and anxiety, but mostly anxiety Still working on trying to leave the house more Still having some panic attacks Plan is to return to work to work half days only 06/03/2023 Will continue seeing her therapist Continue Lexapro 20 mg daily

## 2023-05-26 NOTE — Assessment & Plan Note (Signed)
Chronic Experiencing increased anxiety related to the death of her son and now returning to work next week to work Days Has had panic attacks, difficulty leaving the house, memory issues because of anxiety, difficulty sleeping-panic attacks typically occur outside of the house Anxiety is not completely controlled Increase clonazepam to 0.5 mg once during the day and 2 pills at night Continue Lexapro 20 mg daily She is continuing to see her therapist once a week Plan to return to work 06/03/2023-to work half days

## 2023-05-29 DIAGNOSIS — F4322 Adjustment disorder with anxiety: Secondary | ICD-10-CM | POA: Diagnosis not present

## 2023-06-03 ENCOUNTER — Other Ambulatory Visit (HOSPITAL_COMMUNITY): Payer: Self-pay

## 2023-06-04 ENCOUNTER — Other Ambulatory Visit: Payer: Self-pay

## 2023-06-05 DIAGNOSIS — F4322 Adjustment disorder with anxiety: Secondary | ICD-10-CM | POA: Diagnosis not present

## 2023-06-12 ENCOUNTER — Encounter: Payer: Self-pay | Admitting: Internal Medicine

## 2023-06-13 DIAGNOSIS — F4322 Adjustment disorder with anxiety: Secondary | ICD-10-CM | POA: Diagnosis not present

## 2023-06-17 NOTE — Progress Notes (Signed)
 [facility] [address] [place] [address] Phone: [phone] Subjective:   Bruce Donath, am serving as a scribe for Dr. Antoine Primas.  I'm seeing this patient by the request  of:  Pincus Sanes, MD  CC: back and neck pain follow up   [id number]  Sandra [name] is a 63 y.o. female coming in with complaint of back and neck pain. OMT 12/12/2022. Also f/u for R hand trigger finger. Patient states that her R hand ring finger is getting stuck.   Patient said her back pain is the same as last visit.   Medications patient has been prescribed: Gabapentin  Taking:         Reviewed prior external information including notes and imaging from previsou exam, outside providers and external EMR if available.   As well as notes that were available from care everywhere and other healthcare systems.  Past medical history, social, surgical and family history all reviewed in electronic medical record.  No pertanent information unless stated regarding to the chief complaint.   Past Medical History:  Diagnosis Date   Anxiety    Arthritis    CAD (coronary artery disease)    nonobstructive CAD in LAD per 2013 CTA coronary morph   Cancer [facility])    hzx of uterine cancer   Cholelithiasis 04/06/2021   Depression    Diabetes mellitus    no meds taken now, checks cbg once or twice a week    Difficult intubation    Gallbladder abscess 05/02/2021   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   marginal ulcer   GERD (gastroesophageal reflux disease)    H/O gastric bypass    Hemorrhoids, internal, with bleeding and Grade 2 prolapse 03/05/2013   All positions seen an anoscopy RP columns banded 03/05/2013 LL and RA banded 03/18/2013      History of endometrial cancer 2006 ---- S/P ABD. HYSTERECTOMY   STAGE I ENDOMETROID CARCINOMA   Hx of adenomatous colonic polyps 2011   BENIGN   Hyperlipidemia    no meds now   Hypertension    off meds since 2015 per pt     Allergies  Allergen Reactions   Nsaids     History ulcers.  H/o gastric bypass   Statins     Muscle aches     Review of Systems:  No headache, visual changes, nausea, vomiting, diarrhea, constipation, dizziness, abdominal pain, skin rash, fevers, chills, night sweats, weight loss, swollen lymph nodes, body aches, joint swelling, chest pain, shortness of breath, mood changes. POSITIVE muscle aches  Objective  Blood pressure 110/72, pulse 70, height 5\' 2"  (1.575 m), weight 144 lb (65.3 kg), SpO2 97%.   General: No apparent distress alert and oriented x3 mood and affect normal, dressed appropriately.  HEENT: Pupils equal, extraocular movements intact  Respiratory: Patient's speak in full sentences and does not appear short of breath  Cardiovascular: No lower extremity edema, non tender, no erythema  MSK:  Back: Does have some loss lordosis noted.  Significant tightness of the neck still noted.  Low back does have tightness noted as well. Right hand and first finger seems relatively okay but index finger does have a trigger nodule noted at the A2 pulley.  Osteopathic findings  C2 flexed rotated and side bent right C6 flexed rotated and side bent right  T3 extended rotated and side bent right inhaled rib T8 extended rotated and side bent left L1 flexed rotated and side bent right Sacrum  right on right  Procedure: Real-time Ultrasound Guided Injection of right index finger flexor tendon sheath Device: GE Logiq Q7 Ultrasound guided injection is preferred based studies that show increased duration, increased effect, greater accuracy, decreased procedural pain, increased response rate, and decreased cost with ultrasound guided versus blind injection.  Verbal informed consent obtained.  Time-out conducted.  Noted no overlying erythema, induration, or other signs of local infection.  Skin prepped in a sterile fashion.  Local anesthesia: Topical Ethyl chloride.  With sterile technique  and under real time ultrasound guidance: With a 25-gauge half inch needle injected with 0.5 cc of 0.5% Marcaine and 0.5 cc of Kenalog 40 mg/mL. Completed without difficulty  Pain immediately resolved suggesting accurate placement of the medication.  Advised to call if fevers/chills, erythema, induration, drainage, or persistent bleeding.  Impression: Technically successful ultrasound guided injection.     Assessment and Plan:  Cervical disc disorder with radiculopathy of cervical region Stable overall.  Discussed icing regimen, home exercises, which activities to do and which ones to avoid.  Follow-up again in 6 to 8 weeks  Trigger finger, right index finger Repeat given, tolerated the procedure well, discussed icing regimen and home exercises, which activities to do and which ones to avoid.  Follow-up again in 6 to 8 weeks.    Nonallopathic problems  Decision today to treat with OMT was based on Physical Exam  After verbal consent patient was treated with HVLA, ME, FPR techniques in cervical, rib, thoracic, lumbar, and sacral  areas  Patient tolerated the procedure well with improvement in symptoms  Patient given exercises, stretches and lifestyle modifications  See medications in patient instructions if given  Patient will follow up in 4-8 weeks     The above documentation has been reviewed and is accurate and complete Sandra Saa, DO         Note: This dictation was prepared with Dragon dictation along with smaller phrase technology. Any transcriptional errors that result from this process are unintentional.

## 2023-06-19 ENCOUNTER — Telehealth: Payer: Self-pay | Admitting: Internal Medicine

## 2023-06-19 NOTE — Telephone Encounter (Signed)
 Copied from CRM [id number]. Topic: Medical Record Request - Other >> Jun 19, 2023 10:28 AM Deaijah H wrote: Reason for CRM: [employer] called in to follow up on request that was sent on Feb. 19th for records and a form that needs to be completed by physician / stated she will resend it / please call to confirm if received [phone] ext [phone] Fax # [phone]

## 2023-06-19 NOTE — Telephone Encounter (Signed)
Paperwork received via fax from [employer] and placed in provider's box up front.

## 2023-06-22 ENCOUNTER — Encounter: Payer: Self-pay | Admitting: Internal Medicine

## 2023-06-22 NOTE — Progress Notes (Unsigned)
 Subjective:    Patient ID: Sandra [name], female    DOB: 1961/03/12, 63 y.o.   MRN: [id number]     HPI Sandra [name] is here for follow up of her chronic medical problems.  Back at work - working 1/2 days.  She is working in a very stressful environment which is not helping.   Feeling depressed, anxious and still having panic attacks - had one yesterday.  Not sleeping great due to stress.   Medications and allergies reviewed with patient and updated if appropriate.  Current Outpatient Medications on File Prior to Visit  Medication Sig Dispense Refill   acetaminophen (TYLENOL) 500 MG tablet Take 500 mg by mouth every 6 (six) hours as needed for moderate pain.     clonazePAM (KLONOPIN) 0.5 MG tablet Take 1 tablet (0.5 mg total) by mouth in the morning AND 2 tablets (1 mg total) every evening. 90 tablet 3   COLLAGEN PO Take 1 Scoop by mouth daily.     escitalopram (LEXAPRO) 20 MG tablet Take 1 tablet (20 mg total) by mouth daily. 30 tablet 5   gabapentin (NEURONTIN) 300 MG capsule Take 1 capsule (300 mg total) by mouth 2 (two) times daily. 180 capsule 0   glucose blood (FREESTYLE LITE) test strip Use daily as instructed. 100 each 12   Lancets (FREESTYLE) lancets Use 1 lancet up to 4 times daily to check blood glucose as directed. 100 each 0   MELATONIN PO Take 1 tablet by mouth at bedtime as needed (sleep).     Multiple Vitamins-Minerals (MULTIVITAMIN PO) Take 2 tablets by mouth daily.      omeprazole (PRILOSEC) 40 MG capsule Take 1 capsule by mouth 2 times daily before a meal. Open capsule and sprinkle in water, other liquid or applesauce 180 capsule 1   polyethylene glycol powder (MIRALAX) 17 GM/SCOOP powder Take 17 g by mouth daily. (Patient taking differently: Take 17 g by mouth daily as needed for moderate constipation.) 238 g 0   No current facility-administered medications on file prior to visit.     Review of Systems  Constitutional:  Negative for appetite change (stress  eating).  Cardiovascular:  Positive for chest pain (with panic attacks) and palpitations (with panic attacks).  Neurological:  Positive for light-headedness and headaches.  Psychiatric/Behavioral:  Positive for dysphoric mood and sleep disturbance. The patient is nervous/anxious.        Objective:   Vitals:   06/23/23 0838  BP: 110/72  Pulse: 70  Temp: 98.7 F (37.1 C)  SpO2: 97%   BP Readings from Last 3 Encounters:  06/23/23 110/72  06/23/23 110/72  12/20/22 116/60   Wt Readings from Last 3 Encounters:  06/23/23 164 lb (74.4 kg)  06/23/23 144 lb (65.3 kg)  01/13/23 144 lb (65.3 kg)   Body mass index is 30 kg/m.    Physical Exam Constitutional:      General: She is not in acute distress.    Appearance: Normal appearance. She is not ill-appearing.  HENT:     Head: Normocephalic and atraumatic.  Skin:    General: Skin is warm and dry.  Neurological:     Mental Status: She is alert. Mental status is at baseline.  Psychiatric:        Behavior: Behavior normal.        Thought Content: Thought content normal.        Judgment: Judgment normal.     Comments: Anxious, crying intermittently  Lab Results  Component Value Date   WBC 8.5 12/20/2022   HGB 14.2 12/20/2022   HCT 43.9 12/20/2022   PLT 268.0 12/20/2022   GLUCOSE 138 (H) 12/20/2022   CHOL 133 12/20/2022   TRIG 168.0 (H) 12/20/2022   HDL 44.90 12/20/2022   LDLDIRECT 95.0 11/13/2018   LDLCALC 54 12/20/2022   ALT 39 (H) 12/20/2022   AST 28 12/20/2022   NA 143 12/20/2022   K 4.0 12/20/2022   CL 105 12/20/2022   CREATININE 0.60 12/20/2022   BUN 20 12/20/2022   CO2 31 12/20/2022   TSH 2.56 12/20/2022   INR 1.2 03/06/2021   HGBA1C 6.3 12/20/2022   MICROALBUR 0.9 12/20/2022     Assessment & Plan:    See Problem List for Assessment and Plan of chronic medical problems.

## 2023-06-22 NOTE — Patient Instructions (Addendum)
      Blood work was ordered.       Medications changes include :   None    A referral was ordered and someone will call you to schedule an appointment.     Return in about 6 months (around 12/21/2023) for Physical Exam.

## 2023-06-23 ENCOUNTER — Ambulatory Visit: Payer: Commercial Managed Care - PPO | Admitting: Internal Medicine

## 2023-06-23 ENCOUNTER — Ambulatory Visit: Payer: Commercial Managed Care - PPO | Admitting: Family Medicine

## 2023-06-23 ENCOUNTER — Ambulatory Visit: Payer: Self-pay

## 2023-06-23 VITALS — BP 110/72 | HR 70 | Ht 62.0 in | Wt 144.0 lb

## 2023-06-23 VITALS — BP 110/72 | HR 70 | Temp 98.7°F | Ht 62.0 in | Wt 164.0 lb

## 2023-06-23 DIAGNOSIS — E7800 Pure hypercholesterolemia, unspecified: Secondary | ICD-10-CM

## 2023-06-23 DIAGNOSIS — M9904 Segmental and somatic dysfunction of sacral region: Secondary | ICD-10-CM

## 2023-06-23 DIAGNOSIS — Z634 Disappearance and death of family member: Secondary | ICD-10-CM

## 2023-06-23 DIAGNOSIS — F4321 Adjustment disorder with depressed mood: Secondary | ICD-10-CM | POA: Diagnosis not present

## 2023-06-23 DIAGNOSIS — M9901 Segmental and somatic dysfunction of cervical region: Secondary | ICD-10-CM

## 2023-06-23 DIAGNOSIS — M79641 Pain in right hand: Secondary | ICD-10-CM | POA: Diagnosis not present

## 2023-06-23 DIAGNOSIS — E119 Type 2 diabetes mellitus without complications: Secondary | ICD-10-CM

## 2023-06-23 DIAGNOSIS — M9908 Segmental and somatic dysfunction of rib cage: Secondary | ICD-10-CM | POA: Diagnosis not present

## 2023-06-23 DIAGNOSIS — I2510 Atherosclerotic heart disease of native coronary artery without angina pectoris: Secondary | ICD-10-CM

## 2023-06-23 DIAGNOSIS — M5010 Cervical disc disorder with radiculopathy, unspecified cervical region: Secondary | ICD-10-CM

## 2023-06-23 DIAGNOSIS — M65321 Trigger finger, right index finger: Secondary | ICD-10-CM | POA: Diagnosis not present

## 2023-06-23 DIAGNOSIS — M9903 Segmental and somatic dysfunction of lumbar region: Secondary | ICD-10-CM

## 2023-06-23 DIAGNOSIS — M9902 Segmental and somatic dysfunction of thoracic region: Secondary | ICD-10-CM

## 2023-06-23 DIAGNOSIS — F419 Anxiety disorder, unspecified: Secondary | ICD-10-CM

## 2023-06-23 LAB — COMPREHENSIVE METABOLIC PANEL
ALT: 21 U/L (ref 0–35)
AST: 22 U/L (ref 0–37)
Albumin: 4.6 g/dL (ref 3.5–5.2)
Alkaline Phosphatase: 61 U/L (ref 39–117)
BUN: 20 mg/dL (ref 6–23)
CO2: 26 meq/L (ref 19–32)
Calcium: 9.4 mg/dL (ref 8.4–10.5)
Chloride: 108 meq/L (ref 96–112)
Creatinine, Ser: 0.59 mg/dL (ref 0.40–1.20)
GFR: 95.8 mL/min (ref >60.00)
Glucose, Bld: 174 mg/dL — ABNORMAL HIGH (ref 70–99)
Potassium: 3.9 meq/L (ref 3.5–5.1)
Sodium: 144 meq/L (ref 135–145)
Total Bilirubin: 0.3 mg/dL (ref 0.2–1.2)
Total Protein: 7.1 g/dL (ref 6.0–8.3)

## 2023-06-23 LAB — LIPID PANEL
Cholesterol: 135 mg/dL (ref 0–200)
HDL: 41.1 mg/dL (ref >39.00)
LDL Cholesterol: 56 mg/dL (ref 0–99)
NonHDL: 93.65
Total CHOL/HDL Ratio: 3
Triglycerides: 187 mg/dL — ABNORMAL HIGH (ref 0.0–149.0)
VLDL: 37.4 mg/dL (ref 0.0–40.0)

## 2023-06-23 LAB — HEMOGLOBIN A1C: Hgb A1c MFr Bld: 7.1 % — ABNORMAL HIGH (ref 4.6–6.5)

## 2023-06-23 NOTE — Assessment & Plan Note (Signed)
 Stable overall.  Discussed icing regimen, home exercises, which activities to do and which ones to avoid.  Follow-up again in 6 to 8 weeks

## 2023-06-23 NOTE — Assessment & Plan Note (Addendum)
 Subacute She still struggling with anxiety, depression and grief Seeing her therapist regularly Continue Lexapro 20 mg daily Has returned to work-working half days - will need to stay at half days for now Follow up in 1 month

## 2023-06-23 NOTE — Patient Instructions (Addendum)
 Injected finger today We are here for you Check website See me again in 5-6 weeks

## 2023-06-23 NOTE — Assessment & Plan Note (Signed)
 Chronic  Lab Results  Component Value Date   HGBA1C 6.3 12/20/2022   Sugars well controlled Check A1c Continue lifestyle control Stressed regular exercise, diabetic diet

## 2023-06-23 NOTE — Assessment & Plan Note (Addendum)
 Chronic Experiencing increased anxiety related to the death of her son and now returning to work Has had panic attacks, difficulty sleeping-panic attacks  Anxiety is not ideally controlled Continue clonazepam 0.5 mg once during the day and 2 pills at night Continue Lexapro 20 mg daily Seeing her therapist regularly Has returned to work-working half days - will need to stay at half days for now Follow up in 1 month

## 2023-06-23 NOTE — Assessment & Plan Note (Signed)
 Chronic Nonobstructive CAD by coronary CTA-2013 No symptoms consistent with angina Check lipid panel, CMP Heart healthy diet Regular exercise

## 2023-06-23 NOTE — Assessment & Plan Note (Signed)
 Chronic Lab Results  Component Value Date   LDLCALC 54 12/20/2022   Last LDL to goal Regular exercise and healthy diet encouraged Check lipid panel  Continue lifestyle control

## 2023-06-23 NOTE — Assessment & Plan Note (Signed)
 Repeat given, tolerated the procedure well, discussed icing regimen and home exercises, which activities to do and which ones to avoid.  Follow-up again in 6 to 8 weeks.

## 2023-06-24 DIAGNOSIS — F4322 Adjustment disorder with anxiety: Secondary | ICD-10-CM | POA: Diagnosis not present

## 2023-06-25 ENCOUNTER — Encounter: Payer: Self-pay | Admitting: Internal Medicine

## 2023-07-02 ENCOUNTER — Other Ambulatory Visit (HOSPITAL_COMMUNITY): Payer: Self-pay

## 2023-07-02 ENCOUNTER — Other Ambulatory Visit: Payer: Self-pay | Admitting: Family Medicine

## 2023-07-02 MED ORDER — GABAPENTIN 300 MG PO CAPS
300.0000 mg | ORAL_CAPSULE | Freq: Two times a day (BID) | ORAL | 0 refills | Status: DC
  Filled 2023-07-02: qty 180, 90d supply, fill #0

## 2023-07-11 DIAGNOSIS — F4322 Adjustment disorder with anxiety: Secondary | ICD-10-CM | POA: Diagnosis not present

## 2023-07-11 NOTE — Telephone Encounter (Signed)
 Form completed and faxed today with copy of last OV.

## 2023-07-13 ENCOUNTER — Encounter: Payer: Self-pay | Admitting: Internal Medicine

## 2023-07-14 ENCOUNTER — Other Ambulatory Visit (HOSPITAL_COMMUNITY): Payer: Self-pay

## 2023-07-14 ENCOUNTER — Other Ambulatory Visit: Payer: Self-pay

## 2023-07-20 ENCOUNTER — Encounter: Payer: Self-pay | Admitting: Internal Medicine

## 2023-07-20 NOTE — Progress Notes (Unsigned)
      Subjective:    Patient ID: Sandra [name], female    DOB: 1960-07-06, 63 y.o.   MRN: [id number]     HPI [name] is here for follow up of her chronic medical problems.  Last A1c 7.1% - we will wait 6 months unless she wants to take something.   Anxiety / depression - still struggling at work.    Medications and allergies reviewed with patient and updated if appropriate.  Current Outpatient Medications on File Prior to Visit  Medication Sig Dispense Refill   acetaminophen (TYLENOL) 500 MG tablet Take 500 mg by mouth every 6 (six) hours as needed for moderate pain.     clonazePAM (KLONOPIN) 0.5 MG tablet Take 1 tablet (0.5 mg total) by mouth in the morning AND 2 tablets (1 mg total) every evening. 90 tablet 3   COLLAGEN PO Take 1 Scoop by mouth daily.     escitalopram (LEXAPRO) 20 MG tablet Take 1 tablet (20 mg total) by mouth daily. 30 tablet 5   gabapentin (NEURONTIN) 300 MG capsule Take 1 capsule (300 mg total) by mouth 2 (two) times daily. 180 capsule 0   glucose blood (FREESTYLE LITE) test strip Use daily as instructed. 100 each 12   Lancets (FREESTYLE) lancets Use 1 lancet up to 4 times daily to check blood glucose as directed. 100 each 0   MELATONIN PO Take 1 tablet by mouth at bedtime as needed (sleep).     Multiple Vitamins-Minerals (MULTIVITAMIN PO) Take 2 tablets by mouth daily.      omeprazole (PRILOSEC) 40 MG capsule Take 1 capsule by mouth 2 times daily before a meal. Open capsule and sprinkle in water, other liquid or applesauce 180 capsule 1   polyethylene glycol powder (MIRALAX) 17 GM/SCOOP powder Take 17 g by mouth daily. (Patient taking differently: Take 17 g by mouth daily as needed for moderate constipation.) 238 g 0   No current facility-administered medications on file prior to visit.     Review of Systems     Objective:  There were no vitals filed for this visit. BP Readings from Last 3 Encounters:  06/23/23 110/72  06/23/23 110/72  12/20/22  116/60   Wt Readings from Last 3 Encounters:  06/23/23 164 lb (74.4 kg)  06/23/23 144 lb (65.3 kg)  01/13/23 144 lb (65.3 kg)   There is no height or weight on file to calculate BMI.    Physical Exam     Lab Results  Component Value Date   WBC 8.5 12/20/2022   HGB 14.2 12/20/2022   HCT 43.9 12/20/2022   PLT 268.0 12/20/2022   GLUCOSE 174 (H) 06/23/2023   CHOL 135 06/23/2023   TRIG 187.0 (H) 06/23/2023   HDL 41.10 06/23/2023   LDLDIRECT 95.0 11/13/2018   LDLCALC 56 06/23/2023   ALT 21 06/23/2023   AST 22 06/23/2023   NA 144 06/23/2023   K 3.9 06/23/2023   CL 108 06/23/2023   CREATININE 0.59 06/23/2023   BUN 20 06/23/2023   CO2 26 06/23/2023   TSH 2.56 12/20/2022   INR 1.2 03/06/2021   HGBA1C 7.1 (H) 06/23/2023   MICROALBUR 0.9 12/20/2022     Assessment & Plan:    See Problem List for Assessment and Plan of chronic medical problems.

## 2023-07-21 ENCOUNTER — Ambulatory Visit: Payer: Commercial Managed Care - PPO | Admitting: Internal Medicine

## 2023-07-21 VITALS — BP 108/70 | HR 79 | Temp 98.6°F | Ht 62.0 in | Wt 144.0 lb

## 2023-07-21 DIAGNOSIS — Z634 Disappearance and death of family member: Secondary | ICD-10-CM

## 2023-07-21 DIAGNOSIS — F4321 Adjustment disorder with depressed mood: Secondary | ICD-10-CM | POA: Diagnosis not present

## 2023-07-21 DIAGNOSIS — F419 Anxiety disorder, unspecified: Secondary | ICD-10-CM | POA: Diagnosis not present

## 2023-07-21 NOTE — Assessment & Plan Note (Signed)
 Subacute She still struggling with anxiety, depression and grief Seeing her therapist regularly Continue Lexapro 20 mg daily She is currently working half days and would like to try to go back full days-note given to return to full days Follow-up in 3 months, sooner if needed

## 2023-07-21 NOTE — Assessment & Plan Note (Signed)
 Chronic Still has considerable anxiety Medication overall is working well Continue clonazepam 0.5 mg once during the day and 2 pills at night Continue Lexapro 20 mg daily Seeing her therapist regularly She will start working full days-letter written Will need to provide FMLA for days off as needed

## 2023-07-21 NOTE — Patient Instructions (Addendum)
            Return in about 3 months (around 10/21/2023) for follow up.

## 2023-07-25 DIAGNOSIS — F4322 Adjustment disorder with anxiety: Secondary | ICD-10-CM | POA: Diagnosis not present

## 2023-07-27 ENCOUNTER — Other Ambulatory Visit: Payer: Self-pay | Admitting: Internal Medicine

## 2023-07-28 ENCOUNTER — Other Ambulatory Visit: Payer: Self-pay

## 2023-07-28 ENCOUNTER — Other Ambulatory Visit (HOSPITAL_COMMUNITY): Payer: Self-pay

## 2023-07-28 MED ORDER — ESCITALOPRAM OXALATE 20 MG PO TABS
20.0000 mg | ORAL_TABLET | Freq: Every day | ORAL | 5 refills | Status: AC
  Filled 2023-07-28: qty 30, 30d supply, fill #0
  Filled 2023-09-25: qty 30, 30d supply, fill #1
  Filled 2024-01-01: qty 30, 30d supply, fill #2
  Filled 2024-01-27: qty 30, 30d supply, fill #3
  Filled 2024-03-19: qty 30, 30d supply, fill #4
  Filled 2024-05-06 – 2024-05-15 (×3): qty 30, 30d supply, fill #5

## 2023-08-07 ENCOUNTER — Ambulatory Visit: Payer: Commercial Managed Care - PPO | Admitting: Family Medicine

## 2023-08-12 DIAGNOSIS — F4322 Adjustment disorder with anxiety: Secondary | ICD-10-CM | POA: Diagnosis not present

## 2023-08-26 ENCOUNTER — Other Ambulatory Visit: Payer: Self-pay

## 2023-08-26 ENCOUNTER — Other Ambulatory Visit (HOSPITAL_COMMUNITY): Payer: Self-pay

## 2023-08-28 NOTE — Progress Notes (Deleted)
 [name] [address] [place] [address] Phone: [phone] Subjective:    I'm seeing this patient by the request  of:  Burns, Beckey Bourgeois, MD  CC:   [id number]  Sandra [name] is a 63 y.o. female coming in with complaint of back and neck pain. OMT 06/23/2023. Also f/u for R hand pain. Patient states    Taking:         Reviewed prior external information including notes and imaging from previsou exam, outside providers and external EMR if available.   As well as notes that were available from care everywhere and other healthcare systems.  Past medical history, social, surgical and family history all reviewed in electronic medical record.  No pertanent information unless stated regarding to the chief complaint.   Past Medical History:  Diagnosis Date   Anxiety    Arthritis    CAD (coronary artery disease)    nonobstructive CAD in LAD per 2013 CTA coronary morph   Cancer 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 HYSTERECTOMY   STAGE I ENDOMETROID CARCINOMA   Hx of adenomatous colonic polyps 2011   BENIGN   Hyperlipidemia    no meds now   Hypertension    off meds since 2015 per pt    Allergies  Allergen Reactions   Nsaids     History ulcers.  H/o gastric bypass   Statins     Muscle aches     Review of Systems:  No headache, visual changes, nausea, vomiting, diarrhea, constipation, dizziness, abdominal pain, skin rash, fevers,  chills, night sweats, weight loss, swollen lymph nodes, body aches, joint swelling, chest pain, shortness of breath, mood changes. POSITIVE muscle aches  Objective  There were no vitals taken for this visit.   General: No apparent distress alert and oriented x3 mood and affect normal, dressed appropriately.  HEENT: Pupils equal, extraocular movements intact  Respiratory: Patient's speak in full sentences and does not appear short of breath  Cardiovascular: No lower extremity edema, non tender, no erythema  Gait MSK:  Back   Osteopathic findings  C2 flexed rotated and side bent right C6 flexed rotated and side bent left T3 extended rotated and side bent right inhaled rib T9 extended rotated and side bent left L2 flexed rotated and side bent right Sacrum right on right       Assessment and Plan:  No problem-specific Assessment & Plan notes found for this encounter.    Nonallopathic problems  Decision today to treat with OMT was based on Physical Exam  After verbal consent patient was treated with HVLA, ME, FPR techniques in cervical, rib, thoracic, lumbar, and sacral  areas  Patient tolerated the procedure well with improvement in symptoms  Patient given exercises, stretches and lifestyle modifications  See medications in patient instructions if given  Patient will follow up in 4-8 weeks  Note: This dictation was prepared with Dragon dictation along with smaller phrase technology. Any transcriptional errors that result from this process are unintentional.

## 2023-08-29 ENCOUNTER — Ambulatory Visit: Admitting: Family Medicine

## 2023-09-02 DIAGNOSIS — F4322 Adjustment disorder with anxiety: Secondary | ICD-10-CM | POA: Diagnosis not present

## 2023-09-16 DIAGNOSIS — F4322 Adjustment disorder with anxiety: Secondary | ICD-10-CM | POA: Diagnosis not present

## 2023-09-25 ENCOUNTER — Other Ambulatory Visit: Payer: Self-pay

## 2023-09-25 ENCOUNTER — Other Ambulatory Visit (HOSPITAL_COMMUNITY): Payer: Self-pay

## 2023-09-25 ENCOUNTER — Other Ambulatory Visit: Payer: Self-pay | Admitting: Family Medicine

## 2023-09-25 MED ORDER — GABAPENTIN 300 MG PO CAPS
300.0000 mg | ORAL_CAPSULE | Freq: Two times a day (BID) | ORAL | 0 refills | Status: DC
  Filled 2023-09-25: qty 180, 90d supply, fill #0

## 2023-09-25 NOTE — Progress Notes (Deleted)
 [name] [address] [place] [address] Phone: [phone] Subjective:    I'm seeing this patient by the request  of:  Burns, Beckey Bourgeois, MD  CC:   [id number]  Sandra [name] is a 63 y.o. female coming in with complaint of back and neck pai. OMT 06/23/2023. Also f/u for R hand pain Patient states   Medications patient has been prescribed:   Taking:         Reviewed prior external information including notes and imaging from previsou exam, outside providers and external EMR if available.   As well as notes that were available from care everywhere and other healthcare systems.  Past medical history, social, surgical and family history all reviewed in electronic medical record.  No pertanent information unless stated regarding to the chief complaint.   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 HYSTERECTOMY   STAGE I ENDOMETROID CARCINOMA   Hx of adenomatous colonic polyps 2011   BENIGN   Hyperlipidemia    no meds now   Hypertension    off meds since 2015 per pt    Allergies  Allergen Reactions   Nsaids     History ulcers.  H/o gastric bypass   Statins     Muscle aches     Review of Systems:  No headache, visual changes, nausea, vomiting, diarrhea, constipation,  dizziness, abdominal pain, skin rash, fevers, chills, night sweats, weight loss, swollen lymph nodes, body aches, joint swelling, chest pain, shortness of breath, mood changes. POSITIVE muscle aches  Objective  There were no vitals taken for this visit.   General: No apparent distress alert and oriented x3 mood and affect normal, dressed appropriately.  HEENT: Pupils equal, extraocular movements intact  Respiratory: Patient's speak in full sentences and does not appear short of breath  Cardiovascular: No lower extremity edema, non tender, no erythema  Gait MSK:  Back   Osteopathic findings  C2 flexed rotated and side bent right C6 flexed rotated and side bent left T3 extended rotated and side bent right inhaled rib T9 extended rotated and side bent left L2 flexed rotated and side bent right Sacrum right on right       Assessment and Plan:  No problem-specific Assessment & Plan notes found for this encounter.    Nonallopathic problems  Decision today to treat with OMT was based on Physical Exam  After verbal consent patient was treated with HVLA, ME, FPR techniques in cervical, rib, thoracic, lumbar, and sacral  areas  Patient tolerated the procedure well with improvement in symptoms  Patient given exercises, stretches and lifestyle modifications  See medications in patient instructions if given  Patient will follow up in  4-8 weeks             Note: This dictation was prepared with Dragon dictation along with smaller phrase technology. Any transcriptional errors that result from this process are unintentional.

## 2023-09-26 ENCOUNTER — Ambulatory Visit: Admitting: Family Medicine

## 2023-09-30 NOTE — Progress Notes (Deleted)
 [name] [address] [place] [address] Phone: [phone] Subjective:    I'm seeing this patient by the request  of:  Burns, Beckey Bourgeois, MD  CC:   [id number]  Sandra [name] is a 63 y.o. female coming in with complaint of back and neck pai. OMT 06/23/2023. Also f/u for R hand pain Patient states   Medications patient has been prescribed:   Taking:         Reviewed prior external information including notes and imaging from previsou exam, outside providers and external EMR if available.   As well as notes that were available from care everywhere and other healthcare systems.  Past medical history, social, surgical and family history all reviewed in electronic medical record.  No pertanent information unless stated regarding to the chief complaint.   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 HYSTERECTOMY   STAGE I ENDOMETROID CARCINOMA   Hx of adenomatous colonic polyps 2011   BENIGN   Hyperlipidemia    no meds now   Hypertension    off meds since 2015 per pt    Allergies  Allergen Reactions   Nsaids     History ulcers.  H/o gastric bypass   Statins     Muscle aches     Review of Systems:  No headache, visual changes, nausea, vomiting, diarrhea, constipation,  dizziness, abdominal pain, skin rash, fevers, chills, night sweats, weight loss, swollen lymph nodes, body aches, joint swelling, chest pain, shortness of breath, mood changes. POSITIVE muscle aches  Objective  There were no vitals taken for this visit.   General: No apparent distress alert and oriented x3 mood and affect normal, dressed appropriately.  HEENT: Pupils equal, extraocular movements intact  Respiratory: Patient's speak in full sentences and does not appear short of breath  Cardiovascular: No lower extremity edema, non tender, no erythema  Gait MSK:  Back   Osteopathic findings  C2 flexed rotated and side bent right C6 flexed rotated and side bent left T3 extended rotated and side bent right inhaled rib T9 extended rotated and side bent left L2 flexed rotated and side bent right Sacrum right on right       Assessment and Plan:  No problem-specific Assessment & Plan notes found for this encounter.    Nonallopathic problems  Decision today to treat with OMT was based on Physical Exam  After verbal consent patient was treated with HVLA, ME, FPR techniques in cervical, rib, thoracic, lumbar, and sacral  areas  Patient tolerated the procedure well with improvement in symptoms  Patient given exercises, stretches and lifestyle modifications  See medications in patient instructions if given  Patient will follow up in  4-8 weeks             Note: This dictation was prepared with Dragon dictation along with smaller phrase technology. Any transcriptional errors that result from this process are unintentional.

## 2023-10-01 ENCOUNTER — Ambulatory Visit: Admitting: Family Medicine

## 2023-10-02 ENCOUNTER — Other Ambulatory Visit (HOSPITAL_COMMUNITY): Payer: Self-pay

## 2023-10-02 ENCOUNTER — Other Ambulatory Visit: Payer: Self-pay

## 2023-10-02 DIAGNOSIS — F4322 Adjustment disorder with anxiety: Secondary | ICD-10-CM | POA: Diagnosis not present

## 2023-10-20 NOTE — Patient Instructions (Addendum)
      Blood work was ordered.       Medications changes include :   None    A referral was ordered and someone will call you to schedule an appointment.     Return in about 3 months (around 01/21/2024) for follow up.

## 2023-10-20 NOTE — Progress Notes (Unsigned)
 Subjective:    Patient ID: Sandra [name], female    DOB: 23-Mar-1961, 63 y.o.   MRN: [id number]     HPI Sandra [name] is here for follow up of her chronic medical problems.   Drinks one protein drink a day.  Not compliant with diabetic diet.   Doing ok - still good days and bad days  Medications and allergies reviewed with patient and updated if appropriate.  Current Outpatient Medications on File Prior to Visit  Medication Sig Dispense Refill   acetaminophen  (TYLENOL ) 500 MG tablet Take 500 mg by mouth every 6 (six) hours as needed for moderate pain.     clonazePAM  (KLONOPIN ) 0.5 MG tablet Take 1 tablet (0.5 mg total) by mouth in the morning AND 2 tablets (1 mg total) every evening. 90 tablet 3   COLLAGEN PO Take 1 Scoop by mouth daily.     escitalopram  (LEXAPRO ) 20 MG tablet Take 1 tablet (20 mg total) by mouth daily. 30 tablet 5   gabapentin  (NEURONTIN ) 300 MG capsule Take 1 capsule (300 mg total) by mouth 2 (two) times daily. 180 capsule 0   glucose blood (FREESTYLE LITE) test strip Use daily as instructed. 100 each 12   Lancets (FREESTYLE) lancets Use 1 lancet up to 4 times daily to check blood glucose as directed. 100 each 0   MELATONIN PO Take 1 tablet by mouth at bedtime as needed (sleep).     Multiple Vitamins-Minerals (MULTIVITAMIN PO) Take 2 tablets by mouth daily.      omeprazole  (PRILOSEC) 40 MG capsule Take 1 capsule by mouth 2 times daily before a meal. Open capsule and sprinkle in water , other liquid or applesauce 180 capsule 1   polyethylene glycol powder (MIRALAX ) 17 GM/SCOOP powder Take 17 g by mouth daily. (Patient taking differently: Take 17 g by mouth daily as needed for moderate constipation.) 238 g 0   No current facility-administered medications on file prior to visit.     Review of Systems  Constitutional:  Negative for appetite change.  Respiratory:  Negative for shortness of breath.   Cardiovascular:  Positive for leg swelling (mild at times).  Negative for chest pain (a little tightness with anxiety) and palpitations.  Neurological:  Negative for light-headedness and headaches.  Psychiatric/Behavioral:  Positive for dysphoric mood. Negative for sleep disturbance. The patient is nervous/anxious.        Objective:   Vitals:   10/21/23 0800  BP: 116/70  Pulse: 69  Temp: 98.2 F (36.8 C)  SpO2: 97%   BP Readings from Last 3 Encounters:  10/21/23 116/70  07/21/23 108/70  06/23/23 110/72   Wt Readings from Last 3 Encounters:  10/21/23 142 lb (64.4 kg)  07/21/23 144 lb (65.3 kg)  06/23/23 164 lb (74.4 kg)   Body mass index is 25.97 kg/m.    Physical Exam Constitutional:      General: She is not in acute distress.    Appearance: Normal appearance.  HENT:     Head: Normocephalic and atraumatic.   Eyes:     Conjunctiva/sclera: Conjunctivae normal.    Cardiovascular:     Rate and Rhythm: Normal rate and regular rhythm.     Heart sounds: Normal heart sounds.  Pulmonary:     Effort: Pulmonary effort is normal. No respiratory distress.     Breath sounds: Normal breath sounds. No wheezing.   Musculoskeletal:     Right lower leg: No edema.     Left lower leg:  No edema.   Skin:    General: Skin is warm and dry.     Findings: No rash.   Neurological:     Mental Status: She is alert. Mental status is at baseline.   Psychiatric:        Mood and Affect: Mood normal.        Behavior: Behavior normal.        Lab Results  Component Value Date   WBC 8.5 12/20/2022   HGB 14.2 12/20/2022   HCT 43.9 12/20/2022   PLT 268.0 12/20/2022   GLUCOSE 174 (H) 06/23/2023   CHOL 135 06/23/2023   TRIG 187.0 (H) 06/23/2023   HDL 41.10 06/23/2023   LDLDIRECT 95.0 11/13/2018   LDLCALC 56 06/23/2023   ALT 21 06/23/2023   AST 22 06/23/2023   NA 144 06/23/2023   K 3.9 06/23/2023   CL 108 06/23/2023   CREATININE 0.59 06/23/2023   BUN 20 06/23/2023   CO2 26 06/23/2023   TSH 2.56 12/20/2022   INR 1.2 03/06/2021    HGBA1C 7.1 (H) 06/23/2023   MICROALBUR 0.9 12/20/2022     Assessment & Plan:    See Problem List for Assessment and Plan of chronic medical problems.

## 2023-10-21 ENCOUNTER — Ambulatory Visit: Admitting: Internal Medicine

## 2023-10-21 VITALS — BP 116/70 | HR 69 | Temp 98.2°F | Ht 62.0 in | Wt 142.0 lb

## 2023-10-21 DIAGNOSIS — Z634 Disappearance and death of family member: Secondary | ICD-10-CM

## 2023-10-21 DIAGNOSIS — F4321 Adjustment disorder with depressed mood: Secondary | ICD-10-CM

## 2023-10-21 DIAGNOSIS — E119 Type 2 diabetes mellitus without complications: Secondary | ICD-10-CM | POA: Diagnosis not present

## 2023-10-21 DIAGNOSIS — F419 Anxiety disorder, unspecified: Secondary | ICD-10-CM | POA: Diagnosis not present

## 2023-10-21 NOTE — Assessment & Plan Note (Signed)
 Subacute She still struggling with anxiety, depression and grief Seeing her therapist regularly Continue Lexapro  20 mg daily She is working full-time

## 2023-10-21 NOTE — Assessment & Plan Note (Addendum)
 Chronic  Lab Results  Component Value Date   HGBA1C 7.1 (H) 06/23/2023   Sugars not regularly controlled Working on diet  - recheck A1c at next appt Continue lifestyle control Stressed regular exercise, diabetic diet

## 2023-10-21 NOTE — Assessment & Plan Note (Addendum)
 Chronic Still has considerable anxiety, but fairly controlled Feels medication is helping Continue clonazepam  0.5 mg once during the day and 2 pills at night Continue Lexapro  20 mg daily Seeing her therapist regularly

## 2023-10-22 DIAGNOSIS — F4322 Adjustment disorder with anxiety: Secondary | ICD-10-CM | POA: Diagnosis not present

## 2023-11-05 DIAGNOSIS — F4322 Adjustment disorder with anxiety: Secondary | ICD-10-CM | POA: Diagnosis not present

## 2023-11-09 ENCOUNTER — Other Ambulatory Visit: Payer: Self-pay | Admitting: Internal Medicine

## 2023-11-09 ENCOUNTER — Other Ambulatory Visit: Payer: Self-pay | Admitting: Family Medicine

## 2023-11-10 ENCOUNTER — Other Ambulatory Visit: Payer: Self-pay

## 2023-11-10 ENCOUNTER — Other Ambulatory Visit (HOSPITAL_COMMUNITY): Payer: Self-pay

## 2023-11-10 MED ORDER — GABAPENTIN 300 MG PO CAPS
300.0000 mg | ORAL_CAPSULE | Freq: Two times a day (BID) | ORAL | 0 refills | Status: DC
Start: 2023-11-10 — End: 2023-11-13
  Filled 2023-11-10: qty 180, 90d supply, fill #0

## 2023-11-10 MED ORDER — CLONAZEPAM 0.5 MG PO TABS
ORAL_TABLET | ORAL | 0 refills | Status: DC
  Filled 2023-11-10: qty 90, 30d supply, fill #0

## 2023-11-11 NOTE — Progress Notes (Unsigned)
 Sandra [name] [address] [place] [address] Phone: [phone] Subjective:   Sandra [name], am serving as a scribe for Dr. Arthea Claudene.  I'm seeing this patient by the request  of:  Burns, Glade PARAS, MD  CC: Neck and back pain follow-up  YEP:Dlagzrupcz  Sandra [name] is a 63 y.o. female coming in with complaint of back and neck pain. OMT 06/23/2023. Patient states that she is the same as last visit.   Medications patient has been prescribed: None       Reviewed prior external information including notes and imaging from previsou exam, outside providers and external EMR if available.   As well as notes that were available from care everywhere and other healthcare systems.  Past medical history, social, surgical and family history all reviewed in electronic medical record.  No pertanent information unless stated regarding to the chief complaint.   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 HYSTERECTOMY   STAGE I ENDOMETROID CARCINOMA   Hx of adenomatous colonic polyps 2011   BENIGN   Hyperlipidemia    no meds now   Hypertension    off meds since 2015 per pt    Allergies  Allergen Reactions   Nsaids     History ulcers.  H/o gastric bypass   Statins     Muscle aches      Review of Systems:  No headache, visual changes, nausea, vomiting, diarrhea, constipation, dizziness, abdominal pain, skin rash, fevers, chills, night sweats, weight loss, swollen lymph nodes, body aches, joint swelling, chest pain, shortness of breath, mood changes. POSITIVE muscle aches  Objective  Blood pressure 122/78, pulse 63, height 5' 2 (1.575 m), weight 143 lb (64.9 kg), SpO2 96%.   General: No apparent distress alert and oriented x3 mood and affect normal, dressed appropriately.  HEENT: Pupils equal, extraocular movements intact  Respiratory: Patient's speak in full sentences and does not appear short of breath  Cardiovascular: No lower extremity edema, non tender, no erythema  Gait relatively normal MSK:  Back does have some loss of lordosis.  Some tenderness to palpation in the paraspinal musculature.  Osteopathic findings  C2 flexed rotated and side bent right C6 flexed rotated and side bent left T3 extended rotated and side bent right inhaled rib T9 extended rotated and side bent left L2 flexed rotated and side bent right Sacrum right on right     Assessment and Plan:  Cervical disc disorder with radiculopathy of cervical region Discussed HEP  Discussed which activities to do and which ones to avoid.  Increase activity slowly.  Patient has been working a lot and encouraged her to make sure she is doing some of  the stretching and exercises.  Change gabapentin  to 200 mg during the day up to twice a day and 300 mg at night.  Follow-up again in 6 to 8 weeks    Nonallopathic problems  Decision today to treat with OMT was based on Physical Exam  After verbal consent patient was treated with HVLA, ME, FPR techniques in cervical, rib, thoracic, lumbar, and sacral  areas avoided HVLA on the cervical spine  Patient tolerated the procedure well with improvement in symptoms  Patient given exercises, stretches and lifestyle modifications  See medications in patient  instructions if given  Patient will follow up in 4-8 weeks    The above documentation has been reviewed and is accurate and complete Malikai Gut M Kristen Fromm, DO          Note: This dictation was prepared with Dragon dictation along with smaller phrase technology. Any transcriptional errors that result from this process are unintentional.

## 2023-11-13 ENCOUNTER — Other Ambulatory Visit (HOSPITAL_COMMUNITY): Payer: Self-pay

## 2023-11-13 ENCOUNTER — Encounter: Payer: Self-pay | Admitting: Family Medicine

## 2023-11-13 ENCOUNTER — Ambulatory Visit: Admitting: Family Medicine

## 2023-11-13 ENCOUNTER — Other Ambulatory Visit: Payer: Self-pay

## 2023-11-13 VITALS — BP 122/78 | HR 63 | Ht 62.0 in | Wt 143.0 lb

## 2023-11-13 DIAGNOSIS — M9902 Segmental and somatic dysfunction of thoracic region: Secondary | ICD-10-CM | POA: Diagnosis not present

## 2023-11-13 DIAGNOSIS — M9908 Segmental and somatic dysfunction of rib cage: Secondary | ICD-10-CM

## 2023-11-13 DIAGNOSIS — M9901 Segmental and somatic dysfunction of cervical region: Secondary | ICD-10-CM

## 2023-11-13 DIAGNOSIS — M9904 Segmental and somatic dysfunction of sacral region: Secondary | ICD-10-CM

## 2023-11-13 DIAGNOSIS — M9903 Segmental and somatic dysfunction of lumbar region: Secondary | ICD-10-CM

## 2023-11-13 DIAGNOSIS — M5010 Cervical disc disorder with radiculopathy, unspecified cervical region: Secondary | ICD-10-CM | POA: Diagnosis not present

## 2023-11-13 MED ORDER — GABAPENTIN 100 MG PO CAPS
200.0000 mg | ORAL_CAPSULE | Freq: Two times a day (BID) | ORAL | 0 refills | Status: DC
  Filled 2023-11-13: qty 180, 45d supply, fill #0

## 2023-11-13 NOTE — Assessment & Plan Note (Signed)
 Discussed HEP  Discussed which activities to do and which ones to avoid.  Increase activity slowly.  Patient has been working a lot and encouraged her to make sure she is doing some of the stretching and exercises.  Change gabapentin  to 200 mg during the day up to twice a day and 300 mg at night.  Follow-up again in 6 to 8 weeks

## 2023-11-13 NOTE — Patient Instructions (Signed)
 Gabapentin  300mg  at night Gabapentin  200mg  BID See me again in 7-8 weeks

## 2023-11-20 ENCOUNTER — Encounter: Payer: Self-pay | Admitting: Family Medicine

## 2023-11-20 ENCOUNTER — Other Ambulatory Visit (HOSPITAL_COMMUNITY): Payer: Self-pay

## 2023-11-20 ENCOUNTER — Ambulatory Visit (INDEPENDENT_AMBULATORY_CARE_PROVIDER_SITE_OTHER)

## 2023-11-20 ENCOUNTER — Ambulatory Visit: Admitting: Family Medicine

## 2023-11-20 VITALS — BP 118/82 | HR 70 | Ht 62.0 in

## 2023-11-20 DIAGNOSIS — M79672 Pain in left foot: Secondary | ICD-10-CM

## 2023-11-20 DIAGNOSIS — M7732 Calcaneal spur, left foot: Secondary | ICD-10-CM | POA: Diagnosis not present

## 2023-11-20 NOTE — Patient Instructions (Addendum)
 Thank you for coming in today.   I recommend purchasing a Post-Op Shoe from [employer]. Also, look into a knee scooter.   Let us  know if you need a letter for work.   Plan to follow up with Dr. Leonce in 2 weeks.

## 2023-11-20 NOTE — Progress Notes (Signed)
   I, Leotis Batter, CMA acting as a scribe for Artist Lloyd, MD.  Sandra [name] is a 63 y.o. female who presents to [facility] at [facility] today for foot pain. Pt was previously seen by Dr. Claudene on 11/13/23 for OMT.  Today, pt c/o L foot pain x 2 days. Pt reports they were moving a couch and it was dropped on her foot. Pt locates pain to base of the toes, top of the foot. Swelling present. Sx worse when wearing shoes and with WB. Bruising present at the base of the toes. Limited ROM with toe flexion.   Swelling: yes Treatments tried: Tylenol , Gabapentin   Pertinent review of systems: No fevers or chills  Relevant historical information: Coronary artery disease diabetes.  Patient works as a Loss adjuster, chartered at gastroenterology.   Exam:  BP 118/82   Pulse 70   Ht 5' 2 (1.575 m)   SpO2 97%   BMI 26.16 kg/m  General: Well Developed, well nourished, and in no acute distress.   MSK: Left foot bruising and swelling dorsal forefoot.  Tender palpation dorsal forefoot.  Nontender palpation Lisfranc joint.  Antalgic gait.    Lab and Radiology Results  X-ray images left foot obtained today personally and independently interpreted. No acute fractures are visible metatarsals or midfoot. Await formal radiology review    Assessment and Plan: 63 y.o. female with left foot pain due to a crush injury.  Plan for postop shoe and weightbearing as tolerated.  Recommend a knee scooter at work.  Recheck in 2 weeks with my partner Dr. Claudene or Leonce.   PDMP not reviewed this encounter. Orders Placed This Encounter  Procedures   DG Foot Complete Left    Standing Status:   Future    Number of Occurrences:   1    Expiration Date:   11/19/2024    Reason for Exam (SYMPTOM  OR DIAGNOSIS REQUIRED):   left foot pain    Preferred imaging location?:   [facility]   No orders of the defined types were placed in this encounter.    Discussed warning signs or  symptoms. Please see discharge instructions. Patient expresses understanding.   The above documentation has been reviewed and is accurate and complete Artist Lloyd, M.D.

## 2023-11-26 DIAGNOSIS — F4322 Adjustment disorder with anxiety: Secondary | ICD-10-CM | POA: Diagnosis not present

## 2023-11-27 ENCOUNTER — Ambulatory Visit: Payer: Self-pay | Admitting: Family Medicine

## 2023-11-27 NOTE — Progress Notes (Signed)
Left foot x-ray looks normal to radiology

## 2023-12-09 NOTE — Progress Notes (Signed)
 Ben Everly Rubalcava D.[facility] [address] [place] [address] Phone: [phone]   Assessment and Plan:     1. Left foot pain  -Acute, improving, subsequent visit - Consistent with improving contusion to dorsum of left foot after dropping a couch on the foot - Reviewed patient's x-rays in clinic.  My interpretation: No acute fracture or dislocation - Use Tylenol  500 to 1000 mg tablets 2-3 times a day for day-to-day pain relief - Do not recommend p.o. prescription NSAIDs use due to past medical history of gastric ulcers and gastric sleeve.  Do not recommend p.o. prednisone  course due to past medical history DM type II - Start RICE therapy - Recommend comfortable footwear that resulted in pain-free ambulation.  Patient does not need cam boot.   Pertinent previous records reviewed include left foot x-ray 11/20/23  Follow Up: As needed   Subjective:   I, Sandra [name], am serving as a Neurosurgeon for Doctor Morene [name]  Chief Complaint: left foot pain   HPI:   11/20/2023 Sandra [name] is a 63 y.o. female who presents to [facility] at [facility] today for foot pain. Pt was previously seen by Dr. Claudene on 11/13/23 for OMT.   Today, pt c/o L foot pain x 2 days. Pt reports they were moving a couch and it was dropped on her foot. Pt locates pain to base of the toes, top of the foot. Swelling present. Sx worse when wearing shoes and with WB. Bruising present at the base of the toes. Limited ROM with toe flexion.    Swelling: yes Treatments tried: Tylenol , Gabapentin    Pertinent review of systems: No fevers or chills   Relevant historical information: Coronary artery disease diabetes.  Patient works as a Loss adjuster, chartered at gastroenterology.     Exam:  BP 118/82   Pulse 70   Ht 5' 2 (1.575 m)   SpO2 97%   BMI 26.16 kg/m  General: Well Developed, well nourished, and in no acute distress.    MSK: Left foot bruising and  swelling dorsal forefoot.  Tender palpation dorsal forefoot.  Nontender palpation Lisfranc joint.  Antalgic gait.  12/10/2023 Patient states still has pain and decreased ROM. Has burning and stinging . Still some swelling. Went to [employer] with no boot, but has the boot    Relevant Historical Information: History of gastric bypass, gastric ulcer, DM type II  Additional pertinent review of systems negative.   Current Outpatient Medications:    acetaminophen  (TYLENOL ) 500 MG tablet, Take 500 mg by mouth every 6 (six) hours as needed for moderate pain., Disp: , Rfl:    clonazePAM  (KLONOPIN ) 0.5 MG tablet, Take 1 tablet (0.5 mg total) by mouth in the morning AND 2 tablets (1 mg total) every evening., Disp: 90 tablet, Rfl: 0   COLLAGEN PO, Take 1 Scoop by mouth daily., Disp: , Rfl:    escitalopram  (LEXAPRO ) 20 MG tablet, Take 1 tablet (20 mg total) by mouth daily., Disp: 30 tablet, Rfl: 5   gabapentin  (NEURONTIN ) 100 MG capsule, Take 2 capsules (200 mg total) by mouth 2 (two) times daily., Disp: 180 capsule, Rfl: 0   glucose blood (FREESTYLE LITE) test strip, Use daily as instructed., Disp: 100 each, Rfl: 12   Lancets (FREESTYLE) lancets, Use 1 lancet up to 4 times daily to check blood glucose as directed., Disp: 100 each, Rfl: 0   MELATONIN PO, Take 1 tablet by mouth at bedtime as  needed (sleep)., Disp: , Rfl:    Multiple Vitamins-Minerals (MULTIVITAMIN PO), Take 2 tablets by mouth daily. , Disp: , Rfl:    omeprazole  (PRILOSEC) 40 MG capsule, Take 1 capsule by mouth 2 times daily before a meal. Open capsule and sprinkle in water , other liquid or applesauce, Disp: 180 capsule, Rfl: 1   polyethylene glycol powder (MIRALAX ) 17 GM/SCOOP powder, Take 17 g by mouth daily. (Patient taking differently: Take 17 g by mouth daily as needed for moderate constipation.), Disp: 238 g, Rfl: 0   Objective:     Vitals:   12/10/23 0754  BP: 110/84  Pulse: 67  SpO2: 98%  Weight: 141 lb (64 kg)  Height: 5' 2  (1.575 m)      Body mass index is 25.79 kg/m.    Physical Exam:    Gen: Appears well, nad, nontoxic and pleasant Psych: Alert and oriented, appropriate mood and affect Neuro: sensation intact, strength is 5/5 with df/pf/inv/ev, muscle tone wnl Skin: no susupicious lesions or rashes  Left foot/ankle:  No deformity, no swelling or effusion Ecchymosis with TTP to dorsum of foot superficial to cuneiform, navicular.  No open lesion, erythema. NTTP over fibular head, lat mal, medial mal, achilles, , base of 5th, ATFL, CFL, deltoid, calcaneous ROM DF 30, PF 45, inv/ev intact Negative ant drawer, talar tilt, rotation test, squeeze test. Neg thompson No pain with resisted inversion or eversion    Electronically signed by:  Sandra [name] D[facility] 8:11 AM 12/10/23

## 2023-12-10 ENCOUNTER — Ambulatory Visit: Admitting: Sports Medicine

## 2023-12-10 VITALS — BP 110/84 | HR 67 | Ht 62.0 in | Wt 141.0 lb

## 2023-12-10 DIAGNOSIS — M79672 Pain in left foot: Secondary | ICD-10-CM

## 2023-12-10 NOTE — Patient Instructions (Signed)
 Tylenol  [phone] mg 2-3 times a day for pain relief   Voltaren  gel over areas of pain   Pain free when walking. No need for boot   Rest, ice, elevation, compression   As needed follow up

## 2023-12-17 DIAGNOSIS — Z0279 Encounter for issue of other medical certificate: Secondary | ICD-10-CM

## 2023-12-18 ENCOUNTER — Other Ambulatory Visit: Payer: Self-pay | Admitting: Internal Medicine

## 2023-12-18 ENCOUNTER — Other Ambulatory Visit: Payer: Self-pay | Admitting: Family Medicine

## 2023-12-19 ENCOUNTER — Other Ambulatory Visit (HOSPITAL_COMMUNITY): Payer: Self-pay

## 2023-12-19 ENCOUNTER — Other Ambulatory Visit: Payer: Self-pay

## 2023-12-19 MED ORDER — CLONAZEPAM 0.5 MG PO TABS
ORAL_TABLET | ORAL | 0 refills | Status: DC
  Filled 2023-12-19: qty 90, 30d supply, fill #0

## 2023-12-19 MED ORDER — GABAPENTIN 100 MG PO CAPS
200.0000 mg | ORAL_CAPSULE | Freq: Two times a day (BID) | ORAL | 0 refills | Status: DC
  Filled 2023-12-19: qty 180, 45d supply, fill #0

## 2023-12-21 ENCOUNTER — Encounter: Payer: Self-pay | Admitting: Internal Medicine

## 2023-12-22 ENCOUNTER — Other Ambulatory Visit: Payer: Self-pay

## 2023-12-25 ENCOUNTER — Encounter: Payer: Self-pay | Admitting: Internal Medicine

## 2023-12-25 NOTE — Patient Instructions (Addendum)
 Blood work was ordered.       Medications changes include :   None   Ct cac ordered.    Return in about 6 months (around 06/26/2024) for follow up, Schedule DEXA-[facility]lam.    Health Maintenance, Female Adopting a healthy lifestyle and getting preventive care are important in promoting health and wellness. Ask your health care provider about: The right schedule for you to have regular tests and exams. Things you can do on your own to prevent diseases and keep yourself healthy. What should I know about diet, weight, and exercise? Eat a healthy diet  Eat a diet that includes plenty of vegetables, fruits, low-fat dairy products, and lean protein. Do not eat a lot of foods that are high in solid fats, added sugars, or sodium. Maintain a healthy weight Body mass index (BMI) is used to identify weight problems. It estimates body fat based on height and weight. Your health care provider can help determine your BMI and help you achieve or maintain a healthy weight. Get regular exercise Get regular exercise. This is one of the most important things you can do for your health. Most adults should: Exercise for at least 150 minutes each week. The exercise should increase your heart rate and make you sweat (moderate-intensity exercise). Do strengthening exercises at least twice a week. This is in addition to the moderate-intensity exercise. Spend less time sitting. Even light physical activity can be beneficial. Watch cholesterol and blood lipids Have your blood tested for lipids and cholesterol at 64 years of age, then have this test every 5 years. Have your cholesterol levels checked more often if: Your lipid or cholesterol levels are high. You are older than 64 years of age. You are at high risk for heart disease. What should I know about cancer screening? Depending on your health history and family history, you may need to have cancer screening at various ages. This may include  screening for: Breast cancer. Cervical cancer. Colorectal cancer. Skin cancer. Lung cancer. What should I know about heart disease, diabetes, and high blood pressure? Blood pressure and heart disease High blood pressure causes heart disease and increases the risk of stroke. This is more likely to develop in people who have high blood pressure readings or are overweight. Have your blood pressure checked: Every 3-5 years if you are 50-6 years of age. Every year if you are 84 years old or older. Diabetes Have regular diabetes screenings. This checks your fasting blood sugar level. Have the screening done: Once every three years after age 85 if you are at a normal weight and have a low risk for diabetes. More often and at a younger age if you are overweight or have a high risk for diabetes. What should I know about preventing infection? Hepatitis B If you have a higher risk for hepatitis B, you should be screened for this virus. Talk with your health care provider to find out if you are at risk for hepatitis B infection. Hepatitis C Testing is recommended for: Everyone born from [birth year] through 1966. Anyone with known risk factors for hepatitis C. Sexually transmitted infections (STIs) Get screened for STIs, including gonorrhea and chlamydia, if: You are sexually active and are younger than 64 years of age. You are older than 64 years of age and your health care provider tells you that you are at risk for this type of infection. Your sexual activity has changed since you were last screened, and you are  at increased risk for chlamydia or gonorrhea. Ask your health care provider if you are at risk. Ask your health care provider about whether you are at high risk for HIV. Your health care provider may recommend a prescription medicine to help prevent HIV infection. If you choose to take medicine to prevent HIV, you should first get tested for HIV. You should then be tested every 3 months for as  long as you are taking the medicine. Pregnancy If you are about to stop having your period (premenopausal) and you may become pregnant, seek counseling before you get pregnant. Take 400 to 800 micrograms (mcg) of folic acid  every day if you become pregnant. Ask for birth control (contraception) if you want to prevent pregnancy. Osteoporosis and menopause Osteoporosis is a disease in which the bones lose minerals and strength with aging. This can result in bone fractures. If you are 38 years old or older, or if you are at risk for osteoporosis and fractures, ask your health care provider if you should: Be screened for bone loss. Take a calcium  or vitamin D  supplement to lower your risk of fractures. Be given hormone replacement therapy (HRT) to treat symptoms of menopause. Follow these instructions at home: Alcohol use Do not drink alcohol if: Your health care provider tells you not to drink. You are pregnant, may be pregnant, or are planning to become pregnant. If you drink alcohol: Limit how much you have to: 0-1 drink a day. Know how much alcohol is in your drink. In the [place] one drink equals one 12 oz bottle of beer (355 mL), one 5 oz glass of wine (148 mL), or one 1 oz glass of hard liquor (44 mL). Lifestyle Do not use any products that contain nicotine or tobacco. These products include cigarettes, chewing tobacco, and vaping devices, such as e-cigarettes. If you need help quitting, ask your health care provider. Do not use street drugs. Do not share needles. Ask your health care provider for help if you need support or information about quitting drugs. General instructions Schedule regular health, dental, and eye exams. Stay current with your vaccines. Tell your health care provider if: You often feel depressed. You have ever been abused or do not feel safe at home. Summary Adopting a healthy lifestyle and getting preventive care are important in promoting health and  wellness. Follow your health care provider's instructions about healthy diet, exercising, and getting tested or screened for diseases. Follow your health care provider's instructions on monitoring your cholesterol and blood pressure. This information is not intended to replace advice given to you by your health care provider. Make sure you discuss any questions you have with your health care provider. Document Revised: 09/04/2020 Document Reviewed: 09/04/2020 Elsevier Patient Education  2024 [employer].

## 2023-12-25 NOTE — Progress Notes (Unsigned)
 Subjective:    Patient ID: Sandra [name], female    DOB: 1960/09/12, 63 y.o.   MRN: [id number]      HPI Sandra [name] is here for a Physical exam and her chronic medical problems.   Doing pretty well.  Still has good and bad days.   CTA 07/2011 - total agatston score 48- Ct cac?, statin-LDL has been at goal   Medications and allergies reviewed with patient and updated if appropriate.  Current Outpatient Medications on File Prior to Visit  Medication Sig Dispense Refill   acetaminophen  (TYLENOL ) 500 MG tablet Take 500 mg by mouth every 6 (six) hours as needed for moderate pain.     clonazePAM  (KLONOPIN ) 0.5 MG tablet Take 1 tablet (0.5 mg total) by mouth in the morning AND 2 tablets (1 mg total) every evening. 90 tablet 0   COLLAGEN PO Take 1 Scoop by mouth daily.     escitalopram  (LEXAPRO ) 20 MG tablet Take 1 tablet (20 mg total) by mouth daily. 30 tablet 5   gabapentin  (NEURONTIN ) 100 MG capsule Take 2 capsules (200 mg total) by mouth 2 (two) times daily. 180 capsule 0   glucose blood (FREESTYLE LITE) test strip Use daily as instructed. 100 each 12   Lancets (FREESTYLE) lancets Use 1 lancet up to 4 times daily to check blood glucose as directed. 100 each 0   MELATONIN PO Take 1 tablet by mouth at bedtime as needed (sleep).     Multiple Vitamins-Minerals (MULTIVITAMIN PO) Take 2 tablets by mouth daily.      omeprazole  (PRILOSEC) 40 MG capsule Take 1 capsule by mouth 2 times daily before a meal. Open capsule and sprinkle in water , other liquid or applesauce 180 capsule 1   polyethylene glycol powder (MIRALAX ) 17 GM/SCOOP powder Take 17 g by mouth daily. (Patient taking differently: Take 17 g by mouth daily as needed for moderate constipation.) 238 g 0   No current facility-administered medications on file prior to visit.    Review of Systems  Constitutional:  Negative for fever.  Eyes:  Negative for visual disturbance.  Respiratory:  Positive for shortness of breath (occ with  anxiety). Negative for cough and wheezing.   Cardiovascular:  Positive for chest pain (chest tightness occ with anxiety), palpitations (occ with anxiety) and leg swelling (mild at times - RLE - ? from hip replacement).  Gastrointestinal:  Positive for constipation. Negative for abdominal pain, blood in stool and diarrhea.       No gerd  Genitourinary:  Negative for dysuria.  Musculoskeletal:  Positive for arthralgias (knees, elbow). Negative for back pain.  Skin:  Positive for rash (poison oak on neck).  Neurological:  Positive for light-headedness (a little at times - medication related). Negative for headaches.  Psychiatric/Behavioral:  Positive for dysphoric mood and sleep disturbance (4-6 hrs). The patient is nervous/anxious.        Objective:   Vitals:   12/26/23 0836  BP: 110/70  Pulse: 60  Temp: 98.1 F (36.7 C)  SpO2: 97%   Filed Weights   12/26/23 0836  Weight: 140 lb (63.5 kg)   Body mass index is 25.61 kg/m.  BP Readings from Last 3 Encounters:  12/26/23 110/70  12/10/23 110/84  11/20/23 118/82    Wt Readings from Last 3 Encounters:  12/26/23 140 lb (63.5 kg)  12/10/23 141 lb (64 kg)  11/13/23 143 lb (64.9 kg)       Physical Exam Constitutional: She appears well-developed and well-nourished.  No distress.  HENT:  Head: Normocephalic and atraumatic.  Right Ear: External ear normal. Normal ear canal and TM Left Ear: External ear normal.  Normal ear canal and TM Mouth/Throat: Oropharynx is clear and moist.  Eyes: Conjunctivae normal.  Neck: Neck supple. No tracheal deviation present. No thyromegaly present.  No carotid bruit  Cardiovascular: Normal rate, regular rhythm and normal heart sounds.   No murmur heard.  No edema. Pulmonary/Chest: Effort normal and breath sounds normal. No respiratory distress. She has no wheezes. She has no rales.  Breast: deferred   Abdominal: Soft. She exhibits no distension. There is no tenderness.  Lymphadenopathy: She  has no cervical adenopathy.  Skin: Skin is warm and dry. She is not diaphoretic.  Psychiatric: She has a normal mood and affect. Her behavior is normal.     Lab Results  Component Value Date   WBC 8.5 12/20/2022   HGB 14.2 12/20/2022   HCT 43.9 12/20/2022   PLT 268.0 12/20/2022   GLUCOSE 174 (H) 06/23/2023   CHOL 135 06/23/2023   TRIG 187.0 (H) 06/23/2023   HDL 41.10 06/23/2023   LDLDIRECT 95.0 11/13/2018   LDLCALC 56 06/23/2023   ALT 21 06/23/2023   AST 22 06/23/2023   NA 144 06/23/2023   K 3.9 06/23/2023   CL 108 06/23/2023   CREATININE 0.59 06/23/2023   BUN 20 06/23/2023   CO2 26 06/23/2023   TSH 2.56 12/20/2022   INR 1.2 03/06/2021   HGBA1C 7.1 (H) 06/23/2023         Assessment & Plan:   Physical exam: Screening blood work  ordered Exercise  active - starting to walk - signed up for a challenge through work to walk 2 miles a day Weight  normal Substance abuse  none   Reviewed recommended immunizations.   Health Maintenance  Topic Date Due   Diabetic kidney evaluation - Urine ACR  Never done   FOOT EXAM  10/20/2020   COVID-19 Vaccine (1 - 2024-25 season) Never done   MAMMOGRAM  01/31/2023   Cervical Cancer Screening (HPV/Pap Cotest)  08/25/2023   DEXA SCAN  12/05/2023   INFLUENZA VACCINE  11/28/2023   HEMOGLOBIN A1C  12/21/2023   Pneumococcal Vaccine: 50+ Years (2 of 2 - PCV) 12/25/2024 (Originally 10/06/2014)   OPHTHALMOLOGY EXAM  02/24/2024   Diabetic kidney evaluation - eGFR measurement  06/22/2024   Colonoscopy  07/25/2029   DTaP/Tdap/Td (3 - Td or Tdap) 12/19/2032   Hepatitis C Screening  Completed   HIV Screening  Completed   Hepatitis B Vaccines 19-59 Average Risk  Aged Out   HPV VACCINES  Aged Out   Meningococcal B Vaccine  Aged Out   Zoster Vaccines- Shingrix  Discontinued          See Problem List for Assessment and Plan of chronic medical problems.

## 2023-12-26 ENCOUNTER — Ambulatory Visit: Admitting: Internal Medicine

## 2023-12-26 VITALS — BP 110/70 | HR 60 | Temp 98.1°F | Ht 62.0 in | Wt 140.0 lb

## 2023-12-26 DIAGNOSIS — E7800 Pure hypercholesterolemia, unspecified: Secondary | ICD-10-CM | POA: Diagnosis not present

## 2023-12-26 DIAGNOSIS — Z0000 Encounter for general adult medical examination without abnormal findings: Secondary | ICD-10-CM

## 2023-12-26 DIAGNOSIS — I2510 Atherosclerotic heart disease of native coronary artery without angina pectoris: Secondary | ICD-10-CM | POA: Diagnosis not present

## 2023-12-26 DIAGNOSIS — F419 Anxiety disorder, unspecified: Secondary | ICD-10-CM

## 2023-12-26 DIAGNOSIS — M85852 Other specified disorders of bone density and structure, left thigh: Secondary | ICD-10-CM

## 2023-12-26 DIAGNOSIS — E2839 Other primary ovarian failure: Secondary | ICD-10-CM

## 2023-12-26 DIAGNOSIS — E119 Type 2 diabetes mellitus without complications: Secondary | ICD-10-CM

## 2023-12-26 DIAGNOSIS — M85851 Other specified disorders of bone density and structure, right thigh: Secondary | ICD-10-CM | POA: Diagnosis not present

## 2023-12-26 LAB — LIPID PANEL
Cholesterol: 147 mg/dL (ref 0–200)
HDL: 43 mg/dL (ref >39.00)
LDL Cholesterol: 70 mg/dL (ref 0–99)
NonHDL: 104.47
Total CHOL/HDL Ratio: 3
Triglycerides: 173 mg/dL — ABNORMAL HIGH (ref 0.0–149.0)
VLDL: 34.6 mg/dL (ref 0.0–40.0)

## 2023-12-26 LAB — CBC WITH DIFFERENTIAL/PLATELET
Basophils Absolute: 0 K/uL (ref 0.0–0.1)
Basophils Relative: 0.6 % (ref 0.0–3.0)
Eosinophils Absolute: 0.1 K/uL (ref 0.0–0.7)
Eosinophils Relative: 1.9 % (ref 0.0–5.0)
HCT: 42.3 % (ref 36.0–46.0)
Hemoglobin: 13.9 g/dL (ref 12.0–15.0)
Lymphocytes Relative: 40 % (ref 12.0–46.0)
Lymphs Abs: 2.6 K/uL (ref 0.7–4.0)
MCHC: 32.8 g/dL (ref 30.0–36.0)
MCV: 93.2 fl (ref 78.0–100.0)
Monocytes Absolute: 0.5 K/uL (ref 0.1–1.0)
Monocytes Relative: 7.9 % (ref 3.0–12.0)
Neutro Abs: 3.2 K/uL (ref 1.4–7.7)
Neutrophils Relative %: 49.6 % (ref 43.0–77.0)
Platelets: 226 K/uL (ref 150.0–400.0)
RBC: 4.53 Mil/uL (ref 3.87–5.11)
RDW: 13.2 % (ref 11.5–15.5)
WBC: 6.4 K/uL (ref 4.0–10.5)

## 2023-12-26 LAB — VITAMIN D 25 HYDROXY (VIT D DEFICIENCY, FRACTURES): VITD: 34.71 ng/mL (ref 30.00–100.00)

## 2023-12-26 LAB — COMPREHENSIVE METABOLIC PANEL WITH GFR
ALT: 14 U/L (ref 0–35)
AST: 17 U/L (ref 0–37)
Albumin: 4.3 g/dL (ref 3.5–5.2)
Alkaline Phosphatase: 52 U/L (ref 39–117)
BUN: 26 mg/dL — ABNORMAL HIGH (ref 6–23)
CO2: 29 meq/L (ref 19–32)
Calcium: 9.6 mg/dL (ref 8.4–10.5)
Chloride: 104 meq/L (ref 96–112)
Creatinine, Ser: 0.48 mg/dL (ref 0.40–1.20)
GFR: 100.33 mL/min (ref >60.00)
Glucose, Bld: 148 mg/dL — ABNORMAL HIGH (ref 70–99)
Potassium: 3.7 meq/L (ref 3.5–5.1)
Sodium: 143 meq/L (ref 135–145)
Total Bilirubin: 0.6 mg/dL (ref 0.2–1.2)
Total Protein: 6.8 g/dL (ref 6.0–8.3)

## 2023-12-26 LAB — MICROALBUMIN / CREATININE URINE RATIO
Creatinine,U: 92.2 mg/dL
Microalb Creat Ratio: 20.4 mg/g (ref 0.0–30.0)
Microalb, Ur: 1.9 mg/dL (ref 0.0–1.9)

## 2023-12-26 LAB — TSH: TSH: 2.6 u[IU]/mL (ref 0.35–5.50)

## 2023-12-26 LAB — HEMOGLOBIN A1C: Hgb A1c MFr Bld: 7.2 % — ABNORMAL HIGH (ref 4.6–6.5)

## 2023-12-26 NOTE — Assessment & Plan Note (Signed)
 Chronic DEXA due-ordered Stressed regular exercise Taking MVI Check vitamin d  level

## 2023-12-26 NOTE — Assessment & Plan Note (Signed)
 Chronic Still has considerable anxiety, but fairly controlled Feels medication is helping Continue clonazepam  0.5 mg once during the day and 2 pills at night Continue Lexapro  20 mg daily Seeing her therapist regularly

## 2023-12-26 NOTE — Assessment & Plan Note (Signed)
 Chronic Lab Results  Component Value Date   LDLCALC 56 06/23/2023   Last LDL at goal without medication Regular exercise and healthy diet encouraged Check lipid panel  Continue lifestyle control

## 2023-12-26 NOTE — Assessment & Plan Note (Addendum)
 Chronic Nonobstructive CAD by coronary CTA-2013 Will get CT CAC to reevaluate No symptoms consistent with angina Check lipid panel, CMP Heart healthy diet Regular exercise encouraged

## 2023-12-26 NOTE — Assessment & Plan Note (Signed)
 Chronic  Lab Results  Component Value Date   HGBA1C 7.1 (H) 06/23/2023   Sugars not controlled Check A1c, urine albumin/creatinine ratio Continue lifestyle control Stressed regular exercise, diabetic diet

## 2023-12-29 ENCOUNTER — Other Ambulatory Visit: Payer: Self-pay | Admitting: Internal Medicine

## 2023-12-29 ENCOUNTER — Ambulatory Visit: Payer: Self-pay | Admitting: Internal Medicine

## 2023-12-30 ENCOUNTER — Other Ambulatory Visit: Payer: Self-pay | Admitting: Internal Medicine

## 2023-12-30 ENCOUNTER — Other Ambulatory Visit: Payer: Self-pay

## 2023-12-30 ENCOUNTER — Other Ambulatory Visit (HOSPITAL_COMMUNITY): Payer: Self-pay

## 2023-12-30 ENCOUNTER — Inpatient Hospital Stay: Admission: RE | Admit: 2023-12-30 | Source: Ambulatory Visit

## 2023-12-30 MED ORDER — FREESTYLE LITE TEST VI STRP
ORAL_STRIP | 12 refills | Status: AC
  Filled 2023-12-30: qty 100, 90d supply, fill #0

## 2023-12-31 DIAGNOSIS — F4322 Adjustment disorder with anxiety: Secondary | ICD-10-CM | POA: Diagnosis not present

## 2024-01-01 ENCOUNTER — Other Ambulatory Visit (HOSPITAL_COMMUNITY): Payer: Self-pay

## 2024-01-01 ENCOUNTER — Other Ambulatory Visit: Payer: Self-pay

## 2024-01-01 ENCOUNTER — Other Ambulatory Visit: Payer: Self-pay | Admitting: Internal Medicine

## 2024-01-02 NOTE — Progress Notes (Deleted)
 [facility] [address] [place] [address] Phone: [phone] Subjective:    I'm seeing this patient by the request  of:  Geofm Glade PARAS, MD  CC: Neck and lower back pain follow-up as well as left foot pain follow-up  YEP:Dlagzrupcz  Sandra [name] is a 63 y.o. female coming in with complaint of back and neck pain. OMT 11/13/2023. Saw Dr. Joane and Dr. Leonce for foot pain since last visit. Moving a couch and dropped it on her foot. Patient states   Medications patient has been prescribed:   Taking:      Patient did see another provider for the left foot pain.  Foot x-ray was independently visualized by me showing no significant bony abnormality except for a very small plantar calcaneal spur.   Reviewed prior external information including notes and imaging from previsou exam, outside providers and external EMR if available.   As well as notes that were available from care everywhere and other healthcare systems.  Past medical history, social, surgical and family history all reviewed in electronic medical record.  No pertanent information unless stated regarding to the chief complaint.   Past Medical History:  Diagnosis Date   Anxiety    Arthritis    CAD (coronary artery disease)    nonobstructive CAD in LAD per 2013 CTA coronary morph   Cancer [facility])    hzx of uterine cancer   Cholelithiasis 04/06/2021   Depression    Diabetes mellitus    no meds taken now, checks cbg once or twice a week    Difficult intubation    Gallbladder abscess 05/02/2021   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   marginal ulcer   GERD (gastroesophageal reflux disease)    H/O gastric bypass    Hemorrhoids, internal, with bleeding and Grade 2 prolapse 03/05/2013   All positions seen an anoscopy RP columns banded 03/05/2013 LL and RA banded 03/18/2013      History of endometrial cancer 2006 ---- S/P ABD. HYSTERECTOMY   STAGE I ENDOMETROID CARCINOMA   Hx of  adenomatous colonic polyps 2011   BENIGN   Hyperlipidemia    no meds now   Hypertension    off meds since 2015 per pt    Allergies  Allergen Reactions   Nsaids     History ulcers.  H/o gastric bypass   Statins     Muscle aches     Review of Systems:  No headache, visual changes, nausea, vomiting, diarrhea, constipation, dizziness, abdominal pain, skin rash, fevers, chills, night sweats, weight loss, swollen lymph nodes, body aches, joint swelling, chest pain, shortness of breath, mood changes. POSITIVE muscle aches  Objective  There were no vitals taken for this visit.   General: No apparent distress alert and oriented x3 mood and affect normal, dressed appropriately.  HEENT: Pupils equal, extraocular movements intact  Respiratory: Patient's speak in full sentences and does not appear short of breath  Cardiovascular: No lower extremity edema, non tender, no erythema  Gait MSK:  Back   Osteopathic findings  C2 flexed rotated and side bent right C6 flexed rotated and side bent left T3 extended rotated and side bent right inhaled rib T9 extended rotated and side bent left L2 flexed rotated and side bent right Sacrum right on right       Assessment and Plan:  No problem-specific Assessment & Plan notes found for this encounter.    Nonallopathic problems  Decision today to treat with OMT was  based on Physical Exam  After verbal consent patient was treated with HVLA, ME, FPR techniques in cervical, rib, thoracic, lumbar, and sacral  areas  Patient tolerated the procedure well with improvement in symptoms  Patient given exercises, stretches and lifestyle modifications  See medications in patient instructions if given  Patient will follow up in 4-8 weeks    The above documentation has been reviewed and is accurate and complete Sandra [name] M Sandra Huot, DO          Note: This dictation was prepared with Dragon dictation along with smaller phrase technology. Any  transcriptional errors that result from this process are unintentional.

## 2024-01-05 ENCOUNTER — Ambulatory Visit: Admitting: Family Medicine

## 2024-01-07 ENCOUNTER — Telehealth: Payer: Self-pay

## 2024-01-07 NOTE — Telephone Encounter (Signed)
 Copied from CRM [id number]. Topic: Clinical - Medical Advice >> Jan 07, 2024  1:43 PM Anairis L wrote: Reason for CRM: Patient is calling to see if we can schedule her Bone density test on 01/12/2024 or 01/13/2024 she is off of work. She has a 9am app already.

## 2024-01-09 ENCOUNTER — Inpatient Hospital Stay
Admission: RE | Admit: 2024-01-09 | Discharge: 2024-01-09 | Discharge status: Home / Self Care | Source: Ambulatory Visit | Attending: Internal Medicine

## 2024-01-09 DIAGNOSIS — M85851 Other specified disorders of bone density and structure, right thigh: Secondary | ICD-10-CM

## 2024-01-09 DIAGNOSIS — E2839 Other primary ovarian failure: Secondary | ICD-10-CM

## 2024-01-09 DIAGNOSIS — M85852 Other specified disorders of bone density and structure, left thigh: Secondary | ICD-10-CM

## 2024-01-13 DIAGNOSIS — F4322 Adjustment disorder with anxiety: Secondary | ICD-10-CM | POA: Diagnosis not present

## 2024-01-19 ENCOUNTER — Other Ambulatory Visit (HOSPITAL_BASED_OUTPATIENT_CLINIC_OR_DEPARTMENT_OTHER)

## 2024-01-23 ENCOUNTER — Other Ambulatory Visit: Payer: Self-pay | Admitting: Internal Medicine

## 2024-01-23 ENCOUNTER — Other Ambulatory Visit (HOSPITAL_COMMUNITY): Payer: Self-pay

## 2024-01-25 ENCOUNTER — Encounter: Payer: Self-pay | Admitting: Internal Medicine

## 2024-01-25 DIAGNOSIS — Z136 Encounter for screening for cardiovascular disorders: Secondary | ICD-10-CM

## 2024-01-26 ENCOUNTER — Other Ambulatory Visit: Payer: Self-pay

## 2024-01-26 ENCOUNTER — Other Ambulatory Visit (HOSPITAL_COMMUNITY): Payer: Self-pay

## 2024-01-26 MED ORDER — CLONAZEPAM 0.5 MG PO TABS
ORAL_TABLET | ORAL | 5 refills | Status: AC
  Filled 2024-01-26: qty 90, 30d supply, fill #0
  Filled 2024-03-19: qty 90, 30d supply, fill #1
  Filled 2024-05-06 – 2024-05-15 (×2): qty 90, 30d supply, fill #2

## 2024-01-27 ENCOUNTER — Other Ambulatory Visit: Payer: Self-pay | Admitting: Internal Medicine

## 2024-01-28 ENCOUNTER — Other Ambulatory Visit (HOSPITAL_COMMUNITY): Payer: Self-pay

## 2024-01-28 ENCOUNTER — Other Ambulatory Visit: Payer: Self-pay

## 2024-01-28 MED ORDER — OMEPRAZOLE 40 MG PO CPDR
40.0000 mg | DELAYED_RELEASE_CAPSULE | Freq: Two times a day (BID) | ORAL | 1 refills | Status: AC
  Filled 2024-01-28: qty 180, 90d supply, fill #0

## 2024-02-05 ENCOUNTER — Other Ambulatory Visit: Payer: Self-pay | Admitting: Family Medicine

## 2024-02-06 ENCOUNTER — Other Ambulatory Visit: Payer: Self-pay

## 2024-02-06 ENCOUNTER — Other Ambulatory Visit (HOSPITAL_COMMUNITY): Payer: Self-pay

## 2024-02-06 DIAGNOSIS — F4322 Adjustment disorder with anxiety: Secondary | ICD-10-CM | POA: Diagnosis not present

## 2024-02-06 MED ORDER — GABAPENTIN 100 MG PO CAPS
200.0000 mg | ORAL_CAPSULE | Freq: Two times a day (BID) | ORAL | 0 refills | Status: DC
  Filled 2024-02-06: qty 120, 30d supply, fill #0

## 2024-03-01 DIAGNOSIS — H35371 Puckering of macula, right eye: Secondary | ICD-10-CM | POA: Diagnosis not present

## 2024-03-01 DIAGNOSIS — Z961 Presence of intraocular lens: Secondary | ICD-10-CM | POA: Diagnosis not present

## 2024-03-01 DIAGNOSIS — H40013 Open angle with borderline findings, low risk, bilateral: Secondary | ICD-10-CM | POA: Diagnosis not present

## 2024-03-19 ENCOUNTER — Other Ambulatory Visit: Payer: Self-pay | Admitting: Family Medicine

## 2024-03-21 MED ORDER — GABAPENTIN 100 MG PO CAPS
200.0000 mg | ORAL_CAPSULE | Freq: Two times a day (BID) | ORAL | 0 refills | Status: DC
  Filled 2024-03-21: qty 120, 30d supply, fill #0

## 2024-03-22 ENCOUNTER — Other Ambulatory Visit: Payer: Self-pay

## 2024-04-15 ENCOUNTER — Other Ambulatory Visit: Payer: Self-pay | Admitting: Family Medicine

## 2024-04-15 ENCOUNTER — Other Ambulatory Visit (HOSPITAL_COMMUNITY): Payer: Self-pay

## 2024-04-15 MED ORDER — GABAPENTIN 100 MG PO CAPS
200.0000 mg | ORAL_CAPSULE | Freq: Two times a day (BID) | ORAL | 0 refills | Status: AC
  Filled 2024-04-15 – 2024-04-19 (×2): qty 120, 30d supply, fill #0

## 2024-04-19 ENCOUNTER — Other Ambulatory Visit: Payer: Self-pay

## 2024-04-19 ENCOUNTER — Other Ambulatory Visit (HOSPITAL_COMMUNITY): Payer: Self-pay

## 2024-05-07 ENCOUNTER — Other Ambulatory Visit (HOSPITAL_BASED_OUTPATIENT_CLINIC_OR_DEPARTMENT_OTHER): Payer: Self-pay

## 2024-05-07 ENCOUNTER — Other Ambulatory Visit: Payer: Self-pay

## 2024-05-14 ENCOUNTER — Other Ambulatory Visit (HOSPITAL_COMMUNITY): Payer: Self-pay

## 2024-05-15 ENCOUNTER — Other Ambulatory Visit (HOSPITAL_COMMUNITY): Payer: Self-pay

## 2024-05-17 ENCOUNTER — Other Ambulatory Visit (HOSPITAL_COMMUNITY): Payer: Self-pay

## 2024-06-03 ENCOUNTER — Other Ambulatory Visit (HOSPITAL_COMMUNITY): Payer: Self-pay

## 2024-06-03 ENCOUNTER — Other Ambulatory Visit: Payer: Self-pay | Admitting: Internal Medicine

## 2024-06-23 ENCOUNTER — Ambulatory Visit: Admitting: Family Medicine

## 2024-06-28 ENCOUNTER — Ambulatory Visit: Admitting: Internal Medicine
